# Patient Record
Sex: Male | Born: 1980 | Race: White | Hispanic: No | Marital: Single | Smoking: Never smoker
Health system: Southern US, Community
[De-identification: ages and names within clinical notes are randomized; demographics above are authoritative.]

## PROBLEM LIST (undated history)

## (undated) DIAGNOSIS — J690 Pneumonitis due to inhalation of food and vomit: Secondary | ICD-10-CM

## (undated) DIAGNOSIS — G4730 Sleep apnea, unspecified: Secondary | ICD-10-CM

## (undated) DIAGNOSIS — F419 Anxiety disorder, unspecified: Secondary | ICD-10-CM

## (undated) DIAGNOSIS — J302 Other seasonal allergic rhinitis: Secondary | ICD-10-CM

## (undated) DIAGNOSIS — E119 Type 2 diabetes mellitus without complications: Secondary | ICD-10-CM

## (undated) DIAGNOSIS — I10 Essential (primary) hypertension: Secondary | ICD-10-CM

## (undated) DIAGNOSIS — K449 Diaphragmatic hernia without obstruction or gangrene: Secondary | ICD-10-CM

## (undated) DIAGNOSIS — K5792 Diverticulitis of intestine, part unspecified, without perforation or abscess without bleeding: Secondary | ICD-10-CM

## (undated) DIAGNOSIS — K469 Unspecified abdominal hernia without obstruction or gangrene: Secondary | ICD-10-CM

## (undated) DIAGNOSIS — R7303 Prediabetes: Secondary | ICD-10-CM

## (undated) DIAGNOSIS — K219 Gastro-esophageal reflux disease without esophagitis: Secondary | ICD-10-CM

## (undated) HISTORY — DX: Diverticulitis of intestine, part unspecified, without perforation or abscess without bleeding: K57.92

## (undated) HISTORY — DX: Pneumonitis due to inhalation of food and vomit: J69.0

## (undated) HISTORY — DX: Gastro-esophageal reflux disease without esophagitis: K21.9

---

## 2006-03-29 ENCOUNTER — Emergency Department (HOSPITAL_COMMUNITY): Admission: EM | Admit: 2006-03-29 | Discharge: 2006-03-30 | Payer: Self-pay | Admitting: Emergency Medicine

## 2007-05-01 ENCOUNTER — Emergency Department (HOSPITAL_COMMUNITY): Admission: EM | Admit: 2007-05-01 | Discharge: 2007-05-01 | Payer: Self-pay | Admitting: Emergency Medicine

## 2010-04-18 ENCOUNTER — Emergency Department (HOSPITAL_COMMUNITY): Admission: EM | Admit: 2010-04-18 | Discharge: 2010-04-19 | Payer: Self-pay | Admitting: Emergency Medicine

## 2010-04-25 ENCOUNTER — Ambulatory Visit (HOSPITAL_COMMUNITY): Admission: RE | Admit: 2010-04-25 | Discharge: 2010-04-25 | Payer: Self-pay | Admitting: Family Medicine

## 2010-04-30 ENCOUNTER — Ambulatory Visit (HOSPITAL_COMMUNITY): Admission: RE | Admit: 2010-04-30 | Discharge: 2010-04-30 | Payer: Self-pay | Admitting: Family Medicine

## 2011-01-28 LAB — URINE MICROSCOPIC-ADD ON

## 2011-01-28 LAB — COMPREHENSIVE METABOLIC PANEL
BUN: 9 mg/dL (ref 6–23)
Calcium: 9.9 mg/dL (ref 8.4–10.5)
GFR calc Af Amer: >60 mL/min (ref >60)
GFR calc non Af Amer: 58 mL/min — ABNORMAL LOW (ref >60)
Potassium: 3.6 mEq/L (ref 3.5–5.1)
Sodium: 139 mEq/L (ref 135–145)
Total Protein: 7.3 g/dL (ref 6.0–8.3)

## 2011-01-28 LAB — URINALYSIS, ROUTINE W REFLEX MICROSCOPIC
Bilirubin Urine: NEGATIVE
Glucose, UA: NEGATIVE mg/dL
Ketones, ur: NEGATIVE mg/dL
Leukocytes, UA: NEGATIVE
Nitrite: NEGATIVE
Specific Gravity, Urine: >1.03 — ABNORMAL HIGH (ref 1.005–1.030)

## 2011-01-28 LAB — DIFFERENTIAL
Basophils Absolute: 0.1 10*3/uL (ref 0.0–0.1)
Eosinophils Relative: 2 % (ref 0–5)
Lymphocytes Relative: 35 % (ref 12–46)
Monocytes Absolute: 1.4 10*3/uL — ABNORMAL HIGH (ref 0.1–1.0)

## 2011-01-28 LAB — CK TOTAL AND CKMB (NOT AT ARMC): CK, MB: 1.1 ng/mL (ref 0.3–4.0)

## 2013-12-02 ENCOUNTER — Ambulatory Visit (HOSPITAL_COMMUNITY)
Admission: RE | Admit: 2013-12-02 | Discharge: 2013-12-02 | Disposition: A | Discharge status: Home / Self Care | Payer: BC Managed Care – PPO | Source: Ambulatory Visit | Attending: Family Medicine | Admitting: Family Medicine

## 2013-12-02 ENCOUNTER — Other Ambulatory Visit (HOSPITAL_COMMUNITY): Payer: Self-pay | Admitting: Family Medicine

## 2013-12-02 DIAGNOSIS — R52 Pain, unspecified: Secondary | ICD-10-CM

## 2013-12-02 DIAGNOSIS — Z9181 History of falling: Secondary | ICD-10-CM

## 2013-12-02 DIAGNOSIS — M545 Low back pain, unspecified: Secondary | ICD-10-CM | POA: Insufficient documentation

## 2013-12-02 DIAGNOSIS — M25559 Pain in unspecified hip: Secondary | ICD-10-CM | POA: Insufficient documentation

## 2016-03-04 DIAGNOSIS — H1013 Acute atopic conjunctivitis, bilateral: Secondary | ICD-10-CM | POA: Diagnosis not present

## 2016-03-04 DIAGNOSIS — H40033 Anatomical narrow angle, bilateral: Secondary | ICD-10-CM | POA: Diagnosis not present

## 2016-06-04 DIAGNOSIS — R0981 Nasal congestion: Secondary | ICD-10-CM | POA: Diagnosis not present

## 2016-06-04 DIAGNOSIS — B999 Unspecified infectious disease: Secondary | ICD-10-CM | POA: Diagnosis not present

## 2016-07-11 DIAGNOSIS — R0981 Nasal congestion: Secondary | ICD-10-CM | POA: Diagnosis not present

## 2017-02-08 ENCOUNTER — Emergency Department (HOSPITAL_COMMUNITY)
Admission: EM | Admit: 2017-02-08 | Discharge: 2017-02-09 | Disposition: A | Discharge status: Home / Self Care | Payer: BLUE CROSS/BLUE SHIELD | Attending: Emergency Medicine | Admitting: Emergency Medicine

## 2017-02-08 ENCOUNTER — Encounter (HOSPITAL_COMMUNITY): Payer: Self-pay | Admitting: Emergency Medicine

## 2017-02-08 DIAGNOSIS — S40262D Insect bite (nonvenomous) of left shoulder, subsequent encounter: Secondary | ICD-10-CM | POA: Diagnosis not present

## 2017-02-08 DIAGNOSIS — S40252A Superficial foreign body of left shoulder, initial encounter: Secondary | ICD-10-CM | POA: Diagnosis not present

## 2017-02-08 DIAGNOSIS — W57XXXD Bitten or stung by nonvenomous insect and other nonvenomous arthropods, subsequent encounter: Secondary | ICD-10-CM | POA: Diagnosis not present

## 2017-02-08 DIAGNOSIS — Z23 Encounter for immunization: Secondary | ICD-10-CM | POA: Diagnosis not present

## 2017-02-08 DIAGNOSIS — W57XXXA Bitten or stung by nonvenomous insect and other nonvenomous arthropods, initial encounter: Secondary | ICD-10-CM

## 2017-02-08 MED ORDER — TETANUS-DIPHTHERIA TOXOIDS TD 5-2 LFU IM INJ: 0.5000 mL | INJECTION | Freq: Once | INTRAMUSCULAR | Status: DC

## 2017-02-08 MED ORDER — TETANUS-DIPHTH-ACELL PERTUSSIS 5-2.5-18.5 LF-MCG/0.5 IM SUSP
0.5000 mL | Freq: Once | INTRAMUSCULAR | Status: AC
  Administered 2017-02-08: 0.5 mL via INTRAMUSCULAR
  Filled 2017-02-08: qty 0.5

## 2017-02-08 MED ORDER — TETANUS-DIPHTH-ACELL PERTUSSIS 5-2.5-18.5 LF-MCG/0.5 IM SUSP
INTRAMUSCULAR | Status: AC
  Administered 2017-02-08: 0.5 mL via INTRAMUSCULAR
  Filled 2017-02-08: qty 0.5

## 2017-02-08 MED ORDER — DOXYCYCLINE HYCLATE 100 MG PO TABS
100.0000 mg | ORAL_TABLET | Freq: Once | ORAL | Status: AC
  Administered 2017-02-08: 100 mg via ORAL
  Filled 2017-02-08: qty 1

## 2017-02-08 MED ORDER — DOXYCYCLINE HYCLATE 100 MG PO CAPS: 100.0000 mg | ORAL_CAPSULE | Freq: Two times a day (BID) | ORAL | 0 refills | Status: DC

## 2017-02-08 NOTE — ED Triage Notes (Signed)
Works as a Development worker, [employer]- tonight found tick attached to his left scapular area

## 2017-02-08 NOTE — ED Provider Notes (Signed)
[facility] DEPT Provider Note   CSN: [id number] Arrival date & time: 02/08/17  2144  By signing my name below, I, Julien Nordmann, attest that this documentation has been prepared under the direction and in the presence of [place], [place].  Electronically Signed: Julien Nordmann, ED Scribe. 02/08/17. 10:37 PM.    History   Chief Complaint Chief Complaint  Patient presents with  . Tick Removal   The history is provided by the patient. No language interpreter was used.   HPI Comments: Elijah Shaffer is a 36 y.o. male who presents to the Emergency Department presenting for a tick removal on his left scapula that was noticed this evening. 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 His mother attempted to pull the tick out, but states the head remained inside. He does not have any complaints or symptoms at this time. Pt is not UTD on his tetanus shot.  History reviewed. No pertinent past medical history.  There are no active problems to display for this patient.   History reviewed. No pertinent surgical history.     Home Medications    Prior to Admission medications   Not on File    Family History History reviewed. No pertinent family history.  Social History Social History  Substance Use Topics  . Smoking status: Never Smoker  . Smokeless tobacco: Never Used  . Alcohol use Yes     Allergies   Claritin [loratadine]   Review of Systems Review of Systems  Skin: Positive for wound.  All other systems reviewed and are negative.    Physical Exam Updated Vital Signs BP (!) 141/92 (BP Location: Right Arm)   Pulse (!) 50   Temp 98.2 F (36.8 C) (Oral)   Resp 16   Ht 6\' 3"  (1.905 m)   Wt 230 lb (104.3 kg)   SpO2 100%   BMI 28.75 kg/m   Physical Exam  Constitutional: He is oriented to person, place, and time. He appears well-developed and well-nourished.  HENT:  Head: Normocephalic.  Eyes: EOM are normal.  Neck: Normal  range of motion.  Pulmonary/Chest: Effort normal.  Abdominal: He exhibits no distension.  Musculoskeletal: Normal range of motion.  One pinpoint area of darkness with 2 mm area of erythema to the left scapula region  Neurological: He is alert and oriented to person, place, and time.  Psychiatric: He has a normal mood and affect.  Nursing note and vitals reviewed.    ED Treatments / Results  DIAGNOSTIC STUDIES: Oxygen Saturation is 100% on RA, normal by my interpretation.  COORDINATION OF CARE: 10:37 PM Discussed treatment plan which includes removal of the tick with pt at bedside and pt agreed to plan.  Labs (all labs ordered are listed, but only abnormal results are displayed) Labs Reviewed - No data to display  EKG  EKG Interpretation None     Procedure.  Alcohol prep,  I removed a small dark spot from center of tick bite.    Radiology No results found.  Procedures Procedures (including critical care time)  Medications Ordered in ED Medications - No data to display   Initial Impression / Assessment and Plan / ED Course  I have reviewed the triage vital signs and the nursing notes.  Pertinent labs & imaging results that were available during my care of the patient were reviewed by me and considered in my medical decision making (see chart for details).     Meds ordered  this encounter  Medications  . DISCONTD: tetanus & diphtheria toxoids (adult) (TENIVAC) injection 0.5 mL  . Tdap (BOOSTRIX) injection 0.5 mL  . doxycycline (VIBRAMYCIN) 100 MG capsule    Sig: Take 1 capsule (100 mg total) by mouth 2 (two) times daily.    Dispense:  20 capsule    Refill:  0    Order Specific Question:   Supervising Provider    Answer:   MILLER, BRIAN [[id number]]  . Tdap (BOOSTRIX) 5-2.5-18.5 LF-MCG/0.5 injection    Jimmye Norman, [place]: cabinet override  . doxycycline (VIBRA-TABS) tablet 100 mg    Final Clinical Impressions(s) / ED Diagnoses   Final diagnoses:  Tick bite with  subsequent removal of tick     New Prescriptions Discharge Medication List as of 02/08/2017 11:12 PM    START taking these medications   Details  doxycycline (VIBRAMYCIN) 100 MG capsule Take 1 capsule (100 mg total) by mouth 2 (two) times daily., Starting Sat 02/08/2017, Print      An After Visit Summary was printed and given to the patient.  I personally performed the services in this documentation, which was scribed in my presence.  The recorded information has been reviewed and considered.   Ronnald Collum.   Fransico Meadow, PA-C 02/09/17 [place], DO 02/11/17 2005

## 2017-04-15 DIAGNOSIS — W57XXXA Bitten or stung by nonvenomous insect and other nonvenomous arthropods, initial encounter: Secondary | ICD-10-CM | POA: Diagnosis not present

## 2017-04-15 DIAGNOSIS — L723 Sebaceous cyst: Secondary | ICD-10-CM | POA: Diagnosis not present

## 2017-04-15 DIAGNOSIS — J302 Other seasonal allergic rhinitis: Secondary | ICD-10-CM | POA: Diagnosis not present

## 2017-05-15 DIAGNOSIS — L570 Actinic keratosis: Secondary | ICD-10-CM | POA: Diagnosis not present

## 2017-05-15 DIAGNOSIS — L723 Sebaceous cyst: Secondary | ICD-10-CM | POA: Diagnosis not present

## 2017-06-16 ENCOUNTER — Emergency Department (HOSPITAL_COMMUNITY): Payer: BLUE CROSS/BLUE SHIELD

## 2017-06-16 ENCOUNTER — Encounter (HOSPITAL_COMMUNITY): Payer: Self-pay | Admitting: Emergency Medicine

## 2017-06-16 ENCOUNTER — Emergency Department (HOSPITAL_COMMUNITY)
Admission: EM | Admit: 2017-06-16 | Discharge: 2017-06-16 | Disposition: A | Discharge status: Home / Self Care | Payer: BLUE CROSS/BLUE SHIELD | Attending: Emergency Medicine | Admitting: Emergency Medicine

## 2017-06-16 DIAGNOSIS — R221 Localized swelling, mass and lump, neck: Secondary | ICD-10-CM | POA: Diagnosis not present

## 2017-06-16 DIAGNOSIS — J029 Acute pharyngitis, unspecified: Secondary | ICD-10-CM | POA: Diagnosis not present

## 2017-06-16 DIAGNOSIS — K209 Esophagitis, unspecified without bleeding: Secondary | ICD-10-CM

## 2017-06-16 MED ORDER — SUCRALFATE 1 GM/10ML PO SUSP: 1.0000 g | Freq: Three times a day (TID) | ORAL | 0 refills | Status: DC

## 2017-06-16 MED ORDER — GLUCAGON HCL RDNA (DIAGNOSTIC) 1 MG IJ SOLR
1.0000 mg | Freq: Once | INTRAMUSCULAR | Status: AC | PRN
  Administered 2017-06-16: 1 mg via INTRAVENOUS
  Filled 2017-06-16: qty 1

## 2017-06-16 NOTE — ED Provider Notes (Signed)
[facility] DEPT Provider Note   CSN: [id number] Arrival date & time: 06/16/17  1710     History   Chief Complaint Chief Complaint  Patient presents with  . Sore Throat    HPI Elijah Shaffer is a 36 y.o. male.  The history is provided by the patient. No language interpreter was used.  Sore Throat  This is a new problem. The problem occurs constantly. The problem has been gradually worsening. Pertinent negatives include no abdominal pain. Nothing aggravates the symptoms. Nothing relieves the symptoms. He has tried nothing for the symptoms. The treatment provided no relief.   Pt reports he ate a steak biscuit for breakfast.  Pt reports he began feeling like something was stuck in his throat afterward.  Pt tried to make himself vomit and tried drinking to wash it down.  Pt reports he is able to drink but then feels like he has to vomit.   Pt has not been able to eat.  Pt feels like something is stuck. (Pt Mother reports he does not chew his food well and eat to fast) History reviewed. No pertinent past medical history.  There are no active problems to display for this patient.   History reviewed. No pertinent surgical history.     Home Medications    Prior to Admission medications   Medication Sig Start Date End Date Taking? Authorizing Provider  diphenhydrAMINE (BENADRYL) 25 MG tablet Take 25 mg by mouth once.   Yes [provider]  ibuprofen (ADVIL,MOTRIN) 200 MG tablet Take 200 mg by mouth every 6 (six) hours as needed for moderate pain.   Yes [provider]    Family History No family history on file.  Social History Social History  Substance Use Topics  . Smoking status: Never Smoker  . Smokeless tobacco: Never Used  . Alcohol use Yes     Allergies   Claritin [loratadine]   Review of Systems Review of Systems  Gastrointestinal: Negative for abdominal pain.  All other systems reviewed and are negative.    Physical Exam Updated  Vital Signs BP (!) 129/95 (BP Location: Right Arm)   Pulse 99   Temp 98.4 F (36.9 C) (Oral)   Resp 18   Ht 6\' 3"  (1.905 m)   Wt 104.3 kg (230 lb)   SpO2 99%   BMI 28.75 kg/m   Physical Exam  Constitutional: He appears well-developed.  HENT:  Head: Normocephalic.  Eyes: Pupils are equal, round, and reactive to light. Conjunctivae and EOM are normal.  Neck: Normal range of motion.  Cardiovascular: Normal rate.   Pulmonary/Chest: Effort normal.  Musculoskeletal: Normal range of motion.  Neurological: He is alert.  Skin: Skin is warm.  Psychiatric: He has a normal mood and affect.  Nursing note and vitals reviewed.    ED Treatments / Results  Labs (all labs ordered are listed, but only abnormal results are displayed) Labs Reviewed - No data to display  EKG  EKG Interpretation None       Radiology No results found.  Procedures Procedures (including critical care time)  Medications Ordered in ED Medications  glucagon (human recombinant) (GLUCAGEN) injection 1 mg (not administered)     Initial Impression / Assessment and Plan / ED Course  I have reviewed the triage vital signs and the nursing notes.  Pertinent labs & imaging results that were available during my care of the patient were reviewed by me and considered in my medical decision making (see chart for  details).     Pt reports no change with glucagon.  Pt able to tolerate 8 oz coke without vomiting.  I spoke to Dr. Gala Romney [facility]  who advised carafate.  Pt is to call him in the am to schedule followup.  Pt advised clear liquids.  I doubt meat impaction as pt is able to tolerate oral fluids, possible esophagitis/esphageal abrasion.  Pt counseled on results and plan.  Pt agrees to plan.  Final Clinical Impressions(s) / ED Diagnoses   Final diagnoses:  Esophagitis    New Prescriptions New Prescriptions   No medications on file  An After Visit Summary was printed and given to the patient. Meds ordered  this encounter  Medications  . glucagon (human recombinant) (GLUCAGEN) injection 1 mg  . diphenhydrAMINE (BENADRYL) 25 MG tablet    Sig: Take 25 mg by mouth once.  [name] ibuprofen (ADVIL,MOTRIN) 200 MG tablet    Sig: Take 200 mg by mouth every 6 (six) hours as needed for moderate pain.  [name] sucralfate (CARAFATE) 1 GM/10ML suspension    Sig: Take 10 mLs (1 g total) by mouth 4 (four) times daily -  with meals and at bedtime.    Dispense:  400 mL    Refill:  0    Order Specific Question:   Supervising Provider    Answer:   Noemi Chapel [[id number]]     Fransico Meadow, PA-C 06/16/17 2213    Orlie Dakin, MD 06/17/17 [phone]

## 2017-06-16 NOTE — ED Notes (Signed)
Pt to xray

## 2017-06-16 NOTE — ED Triage Notes (Signed)
Patient states that he feels like there is something in his throat. States the sensation is on the left of his throat. Patient is able to breath and eat and swallow fluid at home. NAD.

## 2017-06-19 DIAGNOSIS — D485 Neoplasm of uncertain behavior of skin: Secondary | ICD-10-CM | POA: Diagnosis not present

## 2017-06-28 ENCOUNTER — Inpatient Hospital Stay (HOSPITAL_COMMUNITY)
Admission: EM | Admit: 2017-06-28 | Discharge: 2017-07-02 | DRG: 392 | Disposition: A | Discharge status: Home / Self Care | Payer: BLUE CROSS/BLUE SHIELD | Attending: Internal Medicine | Admitting: Internal Medicine

## 2017-06-28 ENCOUNTER — Emergency Department (HOSPITAL_COMMUNITY): Payer: BLUE CROSS/BLUE SHIELD

## 2017-06-28 ENCOUNTER — Encounter (HOSPITAL_COMMUNITY): Payer: Self-pay | Admitting: Cardiology

## 2017-06-28 DIAGNOSIS — K5792 Diverticulitis of intestine, part unspecified, without perforation or abscess without bleeding: Secondary | ICD-10-CM

## 2017-06-28 DIAGNOSIS — Z79899 Other long term (current) drug therapy: Secondary | ICD-10-CM | POA: Diagnosis not present

## 2017-06-28 DIAGNOSIS — N179 Acute kidney failure, unspecified: Secondary | ICD-10-CM | POA: Diagnosis not present

## 2017-06-28 DIAGNOSIS — Z888 Allergy status to other drugs, medicaments and biological substances status: Secondary | ICD-10-CM

## 2017-06-28 DIAGNOSIS — D72829 Elevated white blood cell count, unspecified: Secondary | ICD-10-CM | POA: Diagnosis not present

## 2017-06-28 DIAGNOSIS — E869 Volume depletion, unspecified: Secondary | ICD-10-CM | POA: Diagnosis not present

## 2017-06-28 DIAGNOSIS — K5720 Diverticulitis of large intestine with perforation and abscess without bleeding: Principal | ICD-10-CM | POA: Diagnosis present

## 2017-06-28 DIAGNOSIS — K5732 Diverticulitis of large intestine without perforation or abscess without bleeding: Secondary | ICD-10-CM

## 2017-06-28 DIAGNOSIS — R1110 Vomiting, unspecified: Secondary | ICD-10-CM | POA: Diagnosis not present

## 2017-06-28 DIAGNOSIS — R109 Unspecified abdominal pain: Secondary | ICD-10-CM | POA: Diagnosis not present

## 2017-06-28 HISTORY — DX: Acute kidney failure, unspecified: N17.9

## 2017-06-28 LAB — COMPREHENSIVE METABOLIC PANEL
ALT: 31 U/L (ref 17–63)
ANION GAP: 9 (ref 5–15)
AST: 25 U/L (ref 15–41)
Albumin: 4.5 g/dL (ref 3.5–5.0)
Alkaline Phosphatase: 66 U/L (ref 38–126)
BUN: 13 mg/dL (ref 6–20)
CHLORIDE: 101 mmol/L (ref 101–111)
CO2: 26 mmol/L (ref 22–32)
Calcium: 9.6 mg/dL (ref 8.9–10.3)
Creatinine, Ser: 1.43 mg/dL — ABNORMAL HIGH (ref 0.61–1.24)
GFR calc non Af Amer: >60 mL/min (ref >60)
Glucose, Bld: 113 mg/dL — ABNORMAL HIGH (ref 65–99)
Potassium: 3.8 mmol/L (ref 3.5–5.1)
SODIUM: 136 mmol/L (ref 135–145)
Total Bilirubin: 2.6 mg/dL — ABNORMAL HIGH (ref 0.3–1.2)
Total Protein: 8.2 g/dL — ABNORMAL HIGH (ref 6.5–8.1)

## 2017-06-28 LAB — CBC WITH DIFFERENTIAL/PLATELET
BASOS PCT: 0 %
Basophils Absolute: 0 10*3/uL (ref 0.0–0.1)
EOS ABS: 0 10*3/uL (ref 0.0–0.7)
EOS PCT: 0 %
HCT: 46.6 % (ref 39.0–52.0)
Hemoglobin: 15.7 g/dL (ref 13.0–17.0)
LYMPHS ABS: 1.5 10*3/uL (ref 0.7–4.0)
Lymphocytes Relative: 7 %
MCH: 30.2 pg (ref 26.0–34.0)
MCHC: 33.7 g/dL (ref 30.0–36.0)
MCV: 89.6 fL (ref 78.0–100.0)
MONOS PCT: 10 %
Monocytes Absolute: 2.2 10*3/uL — ABNORMAL HIGH (ref 0.1–1.0)
Neutro Abs: 18 10*3/uL — ABNORMAL HIGH (ref 1.7–7.7)
Neutrophils Relative %: 83 %
PLATELETS: 234 10*3/uL (ref 150–400)
RBC: 5.2 MIL/uL (ref 4.22–5.81)
RDW: 13.2 % (ref 11.5–15.5)
WBC: 21.7 10*3/uL — AB (ref 4.0–10.5)

## 2017-06-28 LAB — LIPASE, BLOOD: Lipase: 34 U/L (ref 11–51)

## 2017-06-28 MED ORDER — METRONIDAZOLE IN NACL 5-0.79 MG/ML-% IV SOLN
500.0000 mg | Freq: Three times a day (TID) | INTRAVENOUS | Status: DC
  Administered 2017-06-28 – 2017-07-02 (×12): 500 mg via INTRAVENOUS
  Filled 2017-06-28 (×12): qty 100

## 2017-06-28 MED ORDER — KETOROLAC TROMETHAMINE 30 MG/ML IJ SOLN
30.0000 mg | Freq: Four times a day (QID) | INTRAMUSCULAR | Status: DC | PRN
  Administered 2017-06-28 – 2017-06-29 (×4): 30 mg via INTRAVENOUS
  Filled 2017-06-28 (×4): qty 1

## 2017-06-28 MED ORDER — SODIUM CHLORIDE 0.9 % IV BOLUS (SEPSIS)
1000.0000 mL | Freq: Once | INTRAVENOUS | Status: AC
  Administered 2017-06-28: 1000 mL via INTRAVENOUS

## 2017-06-28 MED ORDER — METRONIDAZOLE IN NACL 5-0.79 MG/ML-% IV SOLN
500.0000 mg | Freq: Once | INTRAVENOUS | Status: AC
  Administered 2017-06-28: 500 mg via INTRAVENOUS
  Filled 2017-06-28: qty 100

## 2017-06-28 MED ORDER — ACETAMINOPHEN 650 MG RE SUPP: 650.0000 mg | Freq: Four times a day (QID) | RECTAL | Status: DC | PRN

## 2017-06-28 MED ORDER — ACETAMINOPHEN 325 MG PO TABS: 650.0000 mg | ORAL_TABLET | Freq: Four times a day (QID) | ORAL | Status: DC | PRN

## 2017-06-28 MED ORDER — CIPROFLOXACIN IN D5W 400 MG/200ML IV SOLN
400.0000 mg | Freq: Once | INTRAVENOUS | Status: AC
  Administered 2017-06-28: 400 mg via INTRAVENOUS
  Filled 2017-06-28: qty 200

## 2017-06-28 MED ORDER — ONDANSETRON HCL 4 MG/2ML IJ SOLN
4.0000 mg | Freq: Four times a day (QID) | INTRAMUSCULAR | Status: DC | PRN
  Administered 2017-06-29: 4 mg via INTRAVENOUS
  Filled 2017-06-28 (×2): qty 2

## 2017-06-28 MED ORDER — ONDANSETRON HCL 4 MG PO TABS: 4.0000 mg | ORAL_TABLET | Freq: Four times a day (QID) | ORAL | Status: DC | PRN

## 2017-06-28 MED ORDER — CIPROFLOXACIN IN D5W 400 MG/200ML IV SOLN
400.0000 mg | Freq: Two times a day (BID) | INTRAVENOUS | Status: DC
  Administered 2017-06-28 – 2017-07-02 (×8): 400 mg via INTRAVENOUS
  Filled 2017-06-28 (×9): qty 200

## 2017-06-28 MED ORDER — MORPHINE SULFATE (PF) 4 MG/ML IV SOLN
4.0000 mg | Freq: Once | INTRAVENOUS | Status: AC
  Administered 2017-06-28: 4 mg via INTRAVENOUS
  Filled 2017-06-28: qty 1

## 2017-06-28 MED ORDER — SUCRALFATE 1 G PO TABS
1.0000 g | ORAL_TABLET | Freq: Four times a day (QID) | ORAL | Status: DC
  Administered 2017-06-28 – 2017-07-02 (×15): 1 g via ORAL
  Filled 2017-06-28 (×16): qty 1

## 2017-06-28 MED ORDER — IOPAMIDOL (ISOVUE-300) INJECTION 61%
100.0000 mL | Freq: Once | INTRAVENOUS | Status: AC | PRN
  Administered 2017-06-28: 100 mL via INTRAVENOUS

## 2017-06-28 MED ORDER — SODIUM CHLORIDE 0.9 % IV SOLN
INTRAVENOUS | Status: DC
  Administered 2017-06-28 – 2017-07-02 (×9): via INTRAVENOUS

## 2017-06-28 MED ORDER — ONDANSETRON HCL 4 MG/2ML IJ SOLN
4.0000 mg | Freq: Once | INTRAMUSCULAR | Status: AC
  Administered 2017-06-28: 4 mg via INTRAVENOUS
  Filled 2017-06-28: qty 2

## 2017-06-28 NOTE — ED Notes (Signed)
Patient transported to CT 

## 2017-06-28 NOTE — ED Provider Notes (Signed)
[facility] DEPT Provider Note   CSN: [id number] Arrival date & time: 06/28/17  1033     History   Chief Complaint Chief Complaint  Patient presents with  . Abdominal Pain    HPI Elijah Shaffer is a 36 y.o. male.  Generalized abdominal pain for 3 days with associated vomiting last night. Questionable low-grade fever. He is normally healthy. No vomiting, diarrhea, chest pain, dyspnea. Severity symptoms is moderate. Nothing makes symptoms better or worse.      History reviewed. No pertinent past medical history.  There are no active problems to display for this patient.   History reviewed. No pertinent surgical history.     Home Medications    Prior to Admission medications   Medication Sig Start Date End Date Taking? Authorizing Provider  diphenhydrAMINE (BENADRYL) 25 MG tablet Take 25 mg by mouth once.   Yes [provider]  ibuprofen (ADVIL,MOTRIN) 200 MG tablet Take 200 mg by mouth every 6 (six) hours as needed for moderate pain.   Yes [provider]  sucralfate (CARAFATE) 1 g tablet Take 1 tablet by mouth 4 (four) times daily. 06/17/17  Yes [provider]    Family History History reviewed. No pertinent family history.  Social History Social History  Substance Use Topics  . Smoking status: Never Smoker  . Smokeless tobacco: Never Used  . Alcohol use Yes     Allergies   Claritin [loratadine]   Review of Systems Review of Systems  All other systems reviewed and are negative.    Physical Exam Updated Vital Signs BP 130/79   Pulse 97   Temp 99.3 F (37.4 C) (Oral)   Ht 6\' 3"  (1.905 m)   Wt 104.3 kg (230 lb)   SpO2 93%   BMI 28.75 kg/m   Physical Exam  Constitutional: He is oriented to person, place, and time. He appears well-developed and well-nourished.  HENT:  Head: Normocephalic and atraumatic.  Eyes: Conjunctivae are normal.  Neck: Neck supple.  Cardiovascular: Normal rate and regular rhythm.     Pulmonary/Chest: Effort normal and breath sounds normal.  Abdominal:  Diffuse lower abdominal tenderness.  Musculoskeletal: Normal range of motion.  Neurological: He is alert and oriented to person, place, and time.  Skin: Skin is warm and dry.  Psychiatric: He has a normal mood and affect. His behavior is normal.  Nursing note and vitals reviewed.    ED Treatments / Results  Labs (all labs ordered are listed, but only abnormal results are displayed) Labs Reviewed  CBC WITH DIFFERENTIAL/PLATELET - Abnormal; Notable for the following:       Result Value   WBC 21.7 (*)    Neutro Abs 18.0 (*)    Monocytes Absolute 2.2 (*)    All other components within normal limits  COMPREHENSIVE METABOLIC PANEL - Abnormal; Notable for the following:    Glucose, Bld 113 (*)    Creatinine, Ser 1.43 (*)    Total Protein 8.2 (*)    Total Bilirubin 2.6 (*)    All other components within normal limits  LIPASE, BLOOD    EKG  EKG Interpretation None       Radiology Ct Abdomen Pelvis W Contrast  Result Date: 06/28/2017 CLINICAL DATA:  Abdominal pain and vomiting EXAM: CT ABDOMEN AND PELVIS WITH CONTRAST TECHNIQUE: Multidetector CT imaging of the abdomen and pelvis was performed using the standard protocol following bolus administration of intravenous contrast. CONTRAST:  160mL ISOVUE-300 IOPAMIDOL (ISOVUE-300) INJECTION 61% COMPARISON:  None.  FINDINGS: Lower chest: There is mild bibasilar atelectasis, more on the left than on the right. There is a moderate hiatal hernia. Hepatobiliary: No focal liver lesions are evident. Gallbladder wall does not appear appreciably thickened. There is no biliary duct dilatation. Pancreas: No pancreatic mass or inflammatory focus. Spleen: No splenic lesions are evident. Adrenals/Urinary Tract: Adrenals appear unremarkable bilaterally. Kidneys bilaterally show no evident mass or hydronephrosis on either side. There is no appreciable renal or ureteral calculus on either  side. Urinary bladder is midline with wall thickness within normal limits. Stomach/Bowel: There is extensive sigmoid and descending colonic diverticulosis. There is wall thickening in the mid sigmoid colon anteriorly and just to the right of midline with extensive stranding in the adjacent mesenteric consistent with acute diverticulitis. There are a few tiny foci of microperforation in this area. No well-defined abscess. Elsewhere, no bowel wall or mesenteric thickening. No evident bowel obstruction. No free air is seen beyond the micro- perforation in the region of the sigmoid diverticulitis. No portal venous air evident. Vascular/Lymphatic: There is no abdominal aortic aneurysm. No vascular lesions are evident. There is no adenopathy in the abdomen or pelvis. Reproductive: Prostate and seminal vesicles are normal in size and contour. No evident pelvic mass. Other: Appendix appears normal. No abscess or ascites noted in the abdomen or pelvis. There is a minimal ventral hernia containing only fat. Musculoskeletal: There is slight anterior wedging of the T11 vertebral body. There are no blastic or lytic bone lesions. There is no intramuscular or abdominal wall lesion. IMPRESSION: 1. Focal sigmoid diverticulitis in mid sigmoid colon with wall thickening and mesenteric thickening in this area. There are foci of microperforation in this area. No abscess. 2. Extensive descending colon and sigmoid diverticulosis. No bowel obstruction. No free air beyond microperforation in the region of diverticulitis in the sigmoid region. 3.  Appendix appears normal. 4.  No renal or ureteral calculus.  No hydronephrosis. 5. Moderate hiatal hernia. Rather minimal ventral hernia containing only fat. Critical Value/emergent results were called by telephone at the time of interpretation on 06/28/2017 at 2:43 pm to Dr. Virgel Manifold, who verbally acknowledged these results. Electronically Signed   By: Lowella Grip III M.D.   On:  06/28/2017 14:44    Procedures Procedures (including critical care time)  Medications Ordered in ED Medications  ciprofloxacin (CIPRO) IVPB 400 mg (not administered)  metroNIDAZOLE (FLAGYL) IVPB 500 mg (not administered)  sodium chloride 0.9 % bolus 1,000 mL (1,000 mLs Intravenous New Bag/Given 06/28/17 1337)  ondansetron (ZOFRAN) injection 4 mg (4 mg Intravenous Given 06/28/17 1336)  morphine 4 MG/ML injection 4 mg (4 mg Intravenous Given 06/28/17 1336)  sodium chloride 0.9 % bolus 1,000 mL (1,000 mLs Intravenous New Bag/Given 06/28/17 1336)  iopamidol (ISOVUE-300) 61 % injection 100 mL (100 mLs Intravenous Contrast Given 06/28/17 1419)     Initial Impression / Assessment and Plan / ED Course  I have reviewed the triage vital signs and the nursing notes.  Pertinent labs & imaging results that were available during my care of the patient were reviewed by me and considered in my medical decision making (see chart for details).     Patient presents with diffuse lower abdominal pain. White count 21K.  CT reveals sigmoid diverticulitis with microperforations. Will hydrate, treat pain, IV Flagyl,  IV Cipro. Admit to general medicine.  Final Clinical Impressions(s) / ED Diagnoses   Final diagnoses:  Sigmoid diverticulitis    New Prescriptions New Prescriptions   No medications on file  Nat Christen, MD 06/28/17 [phone]

## 2017-06-28 NOTE — H&P (Signed)
History and Physical  Elijah Shaffer OFB:[id number] DOB: Dec 05, 1980 DOA: 06/28/2017  Referring physician: Dr. Lacinda Axon, ED physician PCP: Jani Gravel, MD  Outpatient Specialists: None  Patient Coming From: Home  Chief Complaint: Abdominal pain  HPI: Elijah Shaffer is a 36 y.o. male with an uncomplicated medical history. Patient has been experiencing pain over the past week in his abdomen, particularly on the right lower abdomen. He's been having feeling sick to his stomach as well. Denies fevers, chills. Over the past 48-72 hours, his pain has acutely increased the patient came to the hospital for evaluation. No particular palliating or provoking factors, although pain medicine given here has been helpful. No blood in his stools.  Emergency Department Course:  CT scan shows diverticulitis in the sigmoid colon with microperforations. White count 21.7 creatinine elevated to 1.43.  Review of Systems:   Pt denies any fevers, chills, vomiting, diarrhea, constipation, shortness of breath, dyspnea on exertion, orthopnea, cough, wheezing, palpitations, headache, vision changes, lightheadedness, dizziness, melena, rectal bleeding.  Review of systems are otherwise negative  History reviewed. No pertinent past medical history. History reviewed. No pertinent surgical history. Social History:  reports that he has never smoked. He has never used smokeless tobacco. He reports that he drinks alcohol. He reports that he does not use drugs. Patient lives at home  Allergies  Allergen Reactions  . Claritin [Loratadine] Other (See Comments)    Heart race    History reviewed. No pertinent family history.  No family history of diverticulitis   Prior to Admission medications   Medication Sig Start Date End Date Taking? Authorizing Provider  diphenhydrAMINE (BENADRYL) 25 MG tablet Take 25 mg by mouth once.   Yes [provider]  ibuprofen (ADVIL,MOTRIN) 200 MG tablet Take 200 mg by mouth every 6  (six) hours as needed for moderate pain.   Yes [provider]  sucralfate (CARAFATE) 1 g tablet Take 1 tablet by mouth 4 (four) times daily. 06/17/17  Yes [provider]    Physical Exam: BP (!) 141/81   Pulse (!) 102   Temp 99.3 F (37.4 C) (Oral)   Ht 6\' 3"  (1.905 m)   Wt 104.3 kg (230 lb)   SpO2 93%   BMI 28.75 kg/m   General: Young Caucasian male . Awake and alert and oriented x3. No acute cardiopulmonary distress.  HEENT: Normocephalic atraumatic.  Right and left ears normal in appearance.  Pupils equal, round, reactive to light. Extraocular muscles are intact. Sclerae anicteric and noninjected.  Moist mucosal membranes. No mucosal lesions.  Neck: Neck supple without lymphadenopathy. No carotid bruits. No masses palpated.  Cardiovascular: Regular rate with normal S1-S2 sounds. No murmurs, rubs, gallops auscultated. No JVD.  Respiratory: Good respiratory effort with no wheezes, rales, rhonchi. Lungs clear to auscultation bilaterally.  No accessory muscle use. Abdomen:Soft, tender in the right lower quadrant with mild rebound tenderness. No guarding. Nondistended. Active bowel sounds. No masses or hepatosplenomegaly  Skin: No rashes, lesions, or ulcerations.  Dry, warm to touch. 2+ dorsalis pedis and radial pulses. Musculoskeletal: No calf or leg pain. All major joints not erythematous nontender.  No upper or lower joint deformation.  Good ROM.  No contractures  Psychiatric: Intact judgment and insight. Pleasant and cooperative. Neurologic: No focal neurological deficits. Strength is 5/5 and symmetric in upper and lower extremities.  Cranial nerves II through XII are grossly intact.           Labs on Admission: I have personally reviewed  following labs and imaging studies  CBC:  Recent Labs Lab 06/28/17 1317  WBC 21.7*  NEUTROABS 18.0*  HGB 15.7  HCT 46.6  MCV 89.6  PLT 742   Basic Metabolic Panel:  Recent Labs Lab 06/28/17 1317  NA 136  K 3.8    CL 101  CO2 26  GLUCOSE 113*  BUN 13  CREATININE 1.43*  CALCIUM 9.6   GFR: Estimated Creatinine Clearance: 93.3 mL/min (A) (by C-G formula based on SCr of 1.43 mg/dL (H)). Liver Function Tests:  Recent Labs Lab 06/28/17 1317  AST 25  ALT 31  ALKPHOS 66  BILITOT 2.6*  PROT 8.2*  ALBUMIN 4.5    Recent Labs Lab 06/28/17 1317  LIPASE 34   No results for input(s): AMMONIA in the last 168 hours. Coagulation Profile: No results for input(s): INR, PROTIME in the last 168 hours. Cardiac Enzymes: No results for input(s): CKTOTAL, CKMB, CKMBINDEX, TROPONINI in the last 168 hours. BNP (last 3 results) No results for input(s): PROBNP in the last 8760 hours. HbA1C: No results for input(s): HGBA1C in the last 72 hours. CBG: No results for input(s): GLUCAP in the last 168 hours. Lipid Profile: No results for input(s): CHOL, HDL, LDLCALC, TRIG, CHOLHDL, LDLDIRECT in the last 72 hours. Thyroid Function Tests: No results for input(s): TSH, T4TOTAL, FREET4, T3FREE, THYROIDAB in the last 72 hours. Anemia Panel: No results for input(s): VITAMINB12, FOLATE, FERRITIN, TIBC, IRON, RETICCTPCT in the last 72 hours. Urine analysis:    Component Value Date/Time   COLORURINE YELLOW 04/18/2010 2028   APPEARANCEUR CLEAR 04/18/2010 2028   LABSPEC >1.030 (H) 04/18/2010 2028   PHURINE 6.0 04/18/2010 2028   GLUCOSEU NEGATIVE 04/18/2010 2028   HGBUR TRACE (A) 04/18/2010 2028   BILIRUBINUR NEGATIVE 04/18/2010 2028   KETONESUR NEGATIVE 04/18/2010 2028   PROTEINUR NEGATIVE 04/18/2010 2028   UROBILINOGEN 0.2 04/18/2010 2028   NITRITE NEGATIVE 04/18/2010 2028   LEUKOCYTESUR NEGATIVE 04/18/2010 2028   Sepsis Labs: @LABRCNTIP (procalcitonin:4,lacticidven:4) )No results found for this or any previous visit (from the past 240 hour(s)).   Radiological Exams on Admission: Ct Abdomen Pelvis W Contrast  Result Date: 06/28/2017 CLINICAL DATA:  Abdominal pain and vomiting EXAM: CT ABDOMEN AND PELVIS  WITH CONTRAST TECHNIQUE: Multidetector CT imaging of the abdomen and pelvis was performed using the standard protocol following bolus administration of intravenous contrast. CONTRAST:  18mL ISOVUE-300 IOPAMIDOL (ISOVUE-300) INJECTION 61% COMPARISON:  None. FINDINGS: Lower chest: There is mild bibasilar atelectasis, more on the left than on the right. There is a moderate hiatal hernia. Hepatobiliary: No focal liver lesions are evident. Gallbladder wall does not appear appreciably thickened. There is no biliary duct dilatation. Pancreas: No pancreatic mass or inflammatory focus. Spleen: No splenic lesions are evident. Adrenals/Urinary Tract: Adrenals appear unremarkable bilaterally. Kidneys bilaterally show no evident mass or hydronephrosis on either side. There is no appreciable renal or ureteral calculus on either side. Urinary bladder is midline with wall thickness within normal limits. Stomach/Bowel: There is extensive sigmoid and descending colonic diverticulosis. There is wall thickening in the mid sigmoid colon anteriorly and just to the right of midline with extensive stranding in the adjacent mesenteric consistent with acute diverticulitis. There are a few tiny foci of microperforation in this area. No well-defined abscess. Elsewhere, no bowel wall or mesenteric thickening. No evident bowel obstruction. No free air is seen beyond the micro- perforation in the region of the sigmoid diverticulitis. No portal venous air evident. Vascular/Lymphatic: There is no abdominal aortic aneurysm. No vascular  lesions are evident. There is no adenopathy in the abdomen or pelvis. Reproductive: Prostate and seminal vesicles are normal in size and contour. No evident pelvic mass. Other: Appendix appears normal. No abscess or ascites noted in the abdomen or pelvis. There is a minimal ventral hernia containing only fat. Musculoskeletal: There is slight anterior wedging of the T11 vertebral body. There are no blastic or lytic  bone lesions. There is no intramuscular or abdominal wall lesion. IMPRESSION: 1. Focal sigmoid diverticulitis in mid sigmoid colon with wall thickening and mesenteric thickening in this area. There are foci of microperforation in this area. No abscess. 2. Extensive descending colon and sigmoid diverticulosis. No bowel obstruction. No free air beyond microperforation in the region of diverticulitis in the sigmoid region. 3.  Appendix appears normal. 4.  No renal or ureteral calculus.  No hydronephrosis. 5. Moderate hiatal hernia. Rather minimal ventral hernia containing only fat. Critical Value/emergent results were called by telephone at the time of interpretation on 06/28/2017 at 2:43 pm to Dr. Virgel Manifold, who verbally acknowledged these results. Electronically Signed   By: Lowella Grip III M.D.   On: 06/28/2017 14:44    Assessment/Plan: Principal Problem:   Diverticulitis Active Problems:   Acute renal injury [facility])    This patient was discussed with the ED physician, including pertinent vitals, physical exam findings, labs, and imaging.  We also discussed care given by the ED provider.  #1 diverticulitis  Admit  I anticipate that the patient will need 48 hours in order to be stable for discharge to home.  Continue ciprofloxacin and Flagyl  Clear liquids  Recheck CBC #2 acute renal injury  Last creatinine was in 2011 which was 1.48. Uncertain as to whether his elevated creatinine is chronic or acute. Will continue IV fluids and recheck creatinine in the morning    DVT prophylaxis: Patient is low risk - will place SCDs Consultants: None  Code Status: Full code  Family Communication:mother in the room Disposition Plan: Patient to return home following admission  Jedediah Noda [place] [facility] Pager [phone]  If 7PM-7AM, please contact night-coverage [email or web address] Password [email or web address]

## 2017-06-28 NOTE — ED Triage Notes (Signed)
Abdominal pain times 3 days. Vomiting since last night.  States he thinks he started having a fever yesterday.   Cyst removed off of right chest last week with sutures still intact.

## 2017-06-29 LAB — BASIC METABOLIC PANEL
Anion gap: 7 (ref 5–15)
BUN: 17 mg/dL (ref 6–20)
CALCIUM: 8.6 mg/dL — AB (ref 8.9–10.3)
CHLORIDE: 105 mmol/L (ref 101–111)
CO2: 25 mmol/L (ref 22–32)
CREATININE: 1.1 mg/dL (ref 0.61–1.24)
GFR calc non Af Amer: >60 mL/min (ref >60)
Glucose, Bld: 100 mg/dL — ABNORMAL HIGH (ref 65–99)
Potassium: 3.8 mmol/L (ref 3.5–5.1)
SODIUM: 137 mmol/L (ref 135–145)

## 2017-06-29 LAB — CBC
HCT: 41.7 % (ref 39.0–52.0)
HEMOGLOBIN: 14.1 g/dL (ref 13.0–17.0)
MCH: 30.4 pg (ref 26.0–34.0)
MCHC: 33.8 g/dL (ref 30.0–36.0)
MCV: 89.9 fL (ref 78.0–100.0)
PLATELETS: 203 10*3/uL (ref 150–400)
RBC: 4.64 MIL/uL (ref 4.22–5.81)
RDW: 13.4 % (ref 11.5–15.5)
WBC: 12.1 10*3/uL — AB (ref 4.0–10.5)

## 2017-06-29 MED ORDER — ONDANSETRON HCL 4 MG/2ML IJ SOLN
4.0000 mg | Freq: Once | INTRAMUSCULAR | Status: AC
  Administered 2017-06-29: 4 mg via INTRAVENOUS

## 2017-06-29 NOTE — Progress Notes (Signed)
[facility]                                                                              Patient Demographics  Elijah Shaffer, is a 36 y.o. male, DOB - September 15, 1981, XNA:[id number]  Admit date - 06/28/2017   Admitting Physician Elijah Mainland, DO  Outpatient Primary MD for the patient is Elijah Gravel, MD  Outpatient specialists:   LOS - 1  days    Chief Complaint  Patient presents with  . Abdominal Pain       Brief summary  Elijah Shaffer is a 36 y.o. male with No significant medical history presenting with 1 week history of progressively worsening right lower abdominal pain with nausea, poor oral intake, no fever or chills. Abdominal CT at the ED revealed diverticulitis with leukocytosis of 21.7 and elevated creatinine of 1.43 Patient admitted for acute diverticulitis   Assessment & Plan    Principal Problem:   Diverticulitis Active Problems:   Acute renal injury ([place])  #1 acute diverticulitis: IV antibiotics Clear liquids-advance diet as tolerated Monitor leukocytosis   #2 acute kidney injury: Likely related to some mild 1 depletion due to diagnosis #1 Gentle hydration Monitor renal function and electrolytes   Code Status: Full  DVT Prophylaxis:  SCD's Family Communication:   Disposition Plan: Home   Time Spent in minutes  32  minutes  Procedures:    Consultants:     Antimicrobials:  Cipro and Flagyl   Medications  Scheduled Meds: . sucralfate  1 g Oral QID   Continuous Infusions: . sodium chloride 125 mL/hr at 06/29/17 0456  . ciprofloxacin 400 mg (06/29/17 0817)  . metronidazole Stopped (06/29/17 0533)   PRN Meds:.acetaminophen **OR** acetaminophen, ketorolac, ondansetron **OR** ondansetron (ZOFRAN) IV   Antibiotics   Anti-infectives    Start     Dose/Rate Route Frequency Ordered Stop   06/28/17 2000  metroNIDAZOLE (FLAGYL) IVPB 500 mg     500 mg 100 mL/hr over 60 Minutes Intravenous Every 8 hours 06/28/17 1950     06/28/17 2000  ciprofloxacin (CIPRO) IVPB 400 mg     400 mg 200 mL/hr over 60 Minutes Intravenous Every 12 hours 06/28/17 1950     06/28/17 1515  ciprofloxacin (CIPRO) IVPB 400 mg     400 mg 200 mL/hr over 60 Minutes Intravenous  Once 06/28/17 1506 06/28/17 1631   06/28/17 1515  metroNIDAZOLE (FLAGYL) IVPB 500 mg     500 mg 100 mL/hr over 60 Minutes Intravenous  Once 06/28/17 1506 06/28/17 1631        Subjective:   Elijah Shaffer was seen and examined today.  H&P reviewed and noted patient denies any fever or chills. Abdominal pain better.   Objective:   Vitals:   06/29/17 0000 06/29/17 0100 06/29/17 0300 06/29/17 0400  BP: 127/72 123/73 116/79 128/80  Pulse: 76 67 65 71  Resp: 17 16 17 16   Temp:    98.5 F (36.9 C)  TempSrc:    Oral  SpO2: 97% 95% 92% 96%  Weight:    108.9 kg (240 lb 1.3 oz)  Height:    6\' 3"  (  1.905 m)    Intake/Output Summary (Last 24 hours) at 06/29/17 1024 Last data filed at 06/29/17 0700  Gross per 24 hour  Intake             2974 ml  Output                0 ml  Net             2974 ml     Wt Readings from Last 3 Encounters:  06/29/17 108.9 kg (240 lb 1.3 oz)  06/16/17 104.3 kg (230 lb)  02/08/17 104.3 kg (230 lb)     Exam  General: NAD  HEENT: NCAT,  PERRL,MMM  Neck: SUPPLE, (-) JVD  Cardiovascular: RRR, (-) GALLOP, (-) MURMUR  Respiratory: CTA  Gastrointestinal: SOFT, (-) DISTENSION, BS(+), (+)  mild right lower quadrant TENDERNESS  Ext: (-) CYANOSIS, (-) EDEMA  Neuro: A, OX 3  Skin:(-) RASH  Psych:NORMAL AFFECT/MOOD   Data Reviewed:  I have personally reviewed following labs and imaging studies  Micro Results No results found for this or any previous visit (from the past 240 hour(s)).  Radiology Reports Dg Neck Soft Tissue  Result Date: 06/16/2017 CLINICAL DATA:  Palpable knot in the neck. EXAM: NECK SOFT TISSUES - 1+ VIEW COMPARISON:  Radiographs of Mar 30, 2006. FINDINGS: There is no evidence of retropharyngeal  soft tissue swelling or epiglottic enlargement. The cervical airway is unremarkable and no radio-opaque foreign body identified. IMPRESSION: Negative. Electronically Signed   By: Marijo Conception, M.D.   On: 06/16/2017 19:50   Ct Abdomen Pelvis W Contrast  Result Date: 06/28/2017 CLINICAL DATA:  Abdominal pain and vomiting EXAM: CT ABDOMEN AND PELVIS WITH CONTRAST TECHNIQUE: Multidetector CT imaging of the abdomen and pelvis was performed using the standard protocol following bolus administration of intravenous contrast. CONTRAST:  119mL ISOVUE-300 IOPAMIDOL (ISOVUE-300) INJECTION 61% COMPARISON:  None. FINDINGS: Lower chest: There is mild bibasilar atelectasis, more on the left than on the right. There is a moderate hiatal hernia. Hepatobiliary: No focal liver lesions are evident. Gallbladder wall does not appear appreciably thickened. There is no biliary duct dilatation. Pancreas: No pancreatic mass or inflammatory focus. Spleen: No splenic lesions are evident. Adrenals/Urinary Tract: Adrenals appear unremarkable bilaterally. Kidneys bilaterally show no evident mass or hydronephrosis on either side. There is no appreciable renal or ureteral calculus on either side. Urinary bladder is midline with wall thickness within normal limits. Stomach/Bowel: There is extensive sigmoid and descending colonic diverticulosis. There is wall thickening in the mid sigmoid colon anteriorly and just to the right of midline with extensive stranding in the adjacent mesenteric consistent with acute diverticulitis. There are a few tiny foci of microperforation in this area. No well-defined abscess. Elsewhere, no bowel wall or mesenteric thickening. No evident bowel obstruction. No free air is seen beyond the micro- perforation in the region of the sigmoid diverticulitis. No portal venous air evident. Vascular/Lymphatic: There is no abdominal aortic aneurysm. No vascular lesions are evident. There is no adenopathy in the abdomen or  pelvis. Reproductive: Prostate and seminal vesicles are normal in size and contour. No evident pelvic mass. Other: Appendix appears normal. No abscess or ascites noted in the abdomen or pelvis. There is a minimal ventral hernia containing only fat. Musculoskeletal: There is slight anterior wedging of the T11 vertebral body. There are no blastic or lytic bone lesions. There is no intramuscular or abdominal wall lesion. IMPRESSION: 1. Focal sigmoid diverticulitis in mid sigmoid colon with wall  thickening and mesenteric thickening in this area. There are foci of microperforation in this area. No abscess. 2. Extensive descending colon and sigmoid diverticulosis. No bowel obstruction. No free air beyond microperforation in the region of diverticulitis in the sigmoid region. 3.  Appendix appears normal. 4.  No renal or ureteral calculus.  No hydronephrosis. 5. Moderate hiatal hernia. Rather minimal ventral hernia containing only fat. Critical Value/emergent results were called by telephone at the time of interpretation on 06/28/2017 at 2:43 pm to Dr. Virgel Manifold, who verbally acknowledged these results. Electronically Signed   By: Lowella Grip III M.D.   On: 06/28/2017 14:44    Lab Data:  CBC:  Recent Labs Lab 06/28/17 1317 06/29/17 0643  WBC 21.7* 12.1*  NEUTROABS 18.0*  --   HGB 15.7 14.1  HCT 46.6 41.7  MCV 89.6 89.9  PLT 234 025   Basic Metabolic Panel:  Recent Labs Lab 06/28/17 1317 06/29/17 0643  NA 136 137  K 3.8 3.8  CL 101 105  CO2 26 25  GLUCOSE 113* 100*  BUN 13 17  CREATININE 1.43* 1.10  CALCIUM 9.6 8.6*   GFR: Estimated Creatinine Clearance: 123.8 mL/min (by C-G formula based on SCr of 1.1 mg/dL). Liver Function Tests:  Recent Labs Lab 06/28/17 1317  AST 25  ALT 31  ALKPHOS 66  BILITOT 2.6*  PROT 8.2*  ALBUMIN 4.5    Recent Labs Lab 06/28/17 1317  LIPASE 34   No results for input(s): AMMONIA in the last 168 hours. Coagulation Profile: No results for  input(s): INR, PROTIME in the last 168 hours. Cardiac Enzymes: No results for input(s): CKTOTAL, CKMB, CKMBINDEX, TROPONINI in the last 168 hours. BNP (last 3 results) No results for input(s): PROBNP in the last 8760 hours. HbA1C: No results for input(s): HGBA1C in the last 72 hours. CBG: No results for input(s): GLUCAP in the last 168 hours. Lipid Profile: No results for input(s): CHOL, HDL, LDLCALC, TRIG, CHOLHDL, LDLDIRECT in the last 72 hours. Thyroid Function Tests: No results for input(s): TSH, T4TOTAL, FREET4, T3FREE, THYROIDAB in the last 72 hours. Anemia Panel: No results for input(s): VITAMINB12, FOLATE, FERRITIN, TIBC, IRON, RETICCTPCT in the last 72 hours. Urine analysis:    Component Value Date/Time   COLORURINE YELLOW 04/18/2010 2028   APPEARANCEUR CLEAR 04/18/2010 2028   LABSPEC >1.030 (H) 04/18/2010 2028   PHURINE 6.0 04/18/2010 2028   GLUCOSEU NEGATIVE 04/18/2010 2028   HGBUR TRACE (A) 04/18/2010 2028   BILIRUBINUR NEGATIVE 04/18/2010 2028   KETONESUR NEGATIVE 04/18/2010 2028   PROTEINUR NEGATIVE 04/18/2010 2028   UROBILINOGEN 0.2 04/18/2010 2028   NITRITE NEGATIVE 04/18/2010 2028   LEUKOCYTESUR NEGATIVE 04/18/2010 2028     OSEI-BONSU,Quamere Mussell M.D. [facility] 06/29/2017, 10:24 AM  Pager: [phone] Between 7am to 7pm - call Pager - [phone]  After 7pm go to [email or web address] - password [email or web address]  Call night coverage person covering after 7pm

## 2017-06-30 DIAGNOSIS — K5732 Diverticulitis of large intestine without perforation or abscess without bleeding: Secondary | ICD-10-CM

## 2017-06-30 LAB — HIV ANTIBODY (ROUTINE TESTING W REFLEX): HIV SCREEN 4TH GENERATION: NONREACTIVE

## 2017-06-30 LAB — CBC
HCT: 41.4 % (ref 39.0–52.0)
HEMOGLOBIN: 13.8 g/dL (ref 13.0–17.0)
MCH: 29.9 pg (ref 26.0–34.0)
MCHC: 33.3 g/dL (ref 30.0–36.0)
MCV: 89.6 fL (ref 78.0–100.0)
PLATELETS: 215 10*3/uL (ref 150–400)
RBC: 4.62 MIL/uL (ref 4.22–5.81)
RDW: 13.2 % (ref 11.5–15.5)
WBC: 11.5 10*3/uL — ABNORMAL HIGH (ref 4.0–10.5)

## 2017-06-30 LAB — BASIC METABOLIC PANEL
ANION GAP: 7 (ref 5–15)
BUN: 20 mg/dL (ref 6–20)
CALCIUM: 8.5 mg/dL — AB (ref 8.9–10.3)
CO2: 25 mmol/L (ref 22–32)
CREATININE: 1.11 mg/dL (ref 0.61–1.24)
Chloride: 105 mmol/L (ref 101–111)
GLUCOSE: 90 mg/dL (ref 65–99)
Potassium: 4 mmol/L (ref 3.5–5.1)
Sodium: 137 mmol/L (ref 135–145)

## 2017-06-30 MED ORDER — ENOXAPARIN SODIUM 40 MG/0.4ML ~~LOC~~ SOLN
40.0000 mg | SUBCUTANEOUS | Status: DC
  Administered 2017-06-30 – 2017-07-02 (×3): 40 mg via SUBCUTANEOUS
  Filled 2017-06-30 (×3): qty 0.4

## 2017-06-30 NOTE — Care Management Note (Signed)
Case Management Note  Patient Details  Name: Elijah Shaffer MRN: [id number] Date of Birth: 1981-01-14  Subjective/Objective:                  Admitted with diverticulitis. Chart reviewed for CM needs. Pt from home, ind, employed and insuranced. Has PCP and transportation. No CM needs noted.   Action/Plan: DC home with self care.  Expected Discharge Date:      07/01/2017            Expected Discharge Plan:  Home/Self Care  In-House Referral:  NA  Discharge planning Services  CM Consult  Post Acute Care Choice:  NA Choice offered to:  NA  Status of Service:  Completed, signed off  Sherald Barge, RN 06/30/2017, 12:18 PM

## 2017-06-30 NOTE — Progress Notes (Signed)
[facility]                                                                              Patient Demographics  Elijah Shaffer, is a 36 y.o. male, DOB - 1980-12-31, IWP:[id number]  Admit date - 06/28/2017   Admitting Physician Truett Mainland, DO  Outpatient Primary MD for the patient is Jani Gravel, MD  Outpatient specialists:   LOS - 2  days    Chief Complaint  Patient presents with  . Abdominal Pain       Brief summary  Elijah Shaffer is a 36 y.o. male with No significant medical history presenting with 1 week history of progressively worsening right lower abdominal pain with nausea, poor oral intake, no fever or chills. Abdominal CT at the ED revealed diverticulitis with leukocytosis of 21.7 and elevated creatinine of 1.43 Patient admitted for acute diverticulitis   Assessment & Plan    Principal Problem:   Diverticulitis Active Problems:   Acute renal injury ([place])  #1 acute diverticulitis:With microperforation IV antibiotics, ciprofloxacin and Flagyl, Transition to npo , as patient still has right lower quadrant pain Leukocytosis improved   #2 acute kidney injury:1.43>1.11 Likely related to some volume depletion due to diagnosis #1 Gentle hydration Monitor renal function and electrolytes   Code Status: Full  DVT Prophylaxis:  SCD's Family Communication: Discussed with mother on the phone by the bedside  Disposition Plan: Likely 1-2 days   Time Spent in minutes  32  minutes  Procedures:    Consultants:     Antimicrobials:  Cipro and Flagyl   Medications  Scheduled Meds: . sucralfate  1 g Oral QID   Continuous Infusions: . sodium chloride 125 mL/hr at 06/30/17 0610  . ciprofloxacin 400 mg (06/30/17 0917)  . metronidazole Stopped (06/30/17 0650)   PRN Meds:.acetaminophen **OR** acetaminophen, ketorolac, ondansetron **OR** ondansetron (ZOFRAN) IV   Antibiotics   Anti-infectives    Start     Dose/Rate Route Frequency Ordered  Stop   06/28/17 2000  metroNIDAZOLE (FLAGYL) IVPB 500 mg     500 mg 100 mL/hr over 60 Minutes Intravenous Every 8 hours 06/28/17 1950     06/28/17 2000  ciprofloxacin (CIPRO) IVPB 400 mg     400 mg 200 mL/hr over 60 Minutes Intravenous Every 12 hours 06/28/17 1950     06/28/17 1515  ciprofloxacin (CIPRO) IVPB 400 mg     400 mg 200 mL/hr over 60 Minutes Intravenous  Once 06/28/17 1506 06/28/17 1631   06/28/17 1515  metroNIDAZOLE (FLAGYL) IVPB 500 mg     500 mg 100 mL/hr over 60 Minutes Intravenous  Once 06/28/17 1506 06/28/17 1631        Subjective:   Elijah Shaffer was seen and examined today.   Still complains of right lower quadrant pain  Objective:   Vitals:   06/29/17 0300 06/29/17 0400 06/29/17 2100 06/30/17 0651  BP: 116/79 128/80 133/82 128/66  Pulse: 65 71 81 62  Resp: 17 16 18 18   Temp:  98.5 F (36.9 C) 99.1 F (37.3 C) 98.2 F (36.8 C)  TempSrc:  Oral  Oral  SpO2: 92%  96% 95% 98%  Weight:  108.9 kg (240 lb 1.3 oz)    Height:  6\' 3"  (1.905 m)      Intake/Output Summary (Last 24 hours) at 06/30/17 0943 Last data filed at 06/30/17 0900  Gross per 24 hour  Intake          3323.42 ml  Output                0 ml  Net          3323.42 ml     Wt Readings from Last 3 Encounters:  06/29/17 108.9 kg (240 lb 1.3 oz)  06/16/17 104.3 kg (230 lb)  02/08/17 104.3 kg (230 lb)     Exam  General: NAD  HEENT: NCAT,  PERRL,MMM  Neck: SUPPLE, (-) JVD  Cardiovascular: RRR, (-) GALLOP, (-) MURMUR  Respiratory: CTA  Gastrointestinal: SOFT, (-) DISTENSION, BS(+), (+)  Right lower quadrant tenderness  Ext: (-) CYANOSIS, (-) EDEMA  Neuro: A, OX 3  Skin:(-) RASH  Psych:NORMAL AFFECT/MOOD   Data Reviewed:  I have personally reviewed following labs and imaging studies  Micro Results No results found for this or any previous visit (from the past 240 hour(s)).  Radiology Reports Dg Neck Soft Tissue  Result Date: 06/16/2017 CLINICAL DATA:  Palpable knot in  the neck. EXAM: NECK SOFT TISSUES - 1+ VIEW COMPARISON:  Radiographs of Mar 30, 2006. FINDINGS: There is no evidence of retropharyngeal soft tissue swelling or epiglottic enlargement. The cervical airway is unremarkable and no radio-opaque foreign body identified. IMPRESSION: Negative. Electronically Signed   By: Marijo Conception, M.D.   On: 06/16/2017 19:50   Ct Abdomen Pelvis W Contrast  Result Date: 06/28/2017 CLINICAL DATA:  Abdominal pain and vomiting EXAM: CT ABDOMEN AND PELVIS WITH CONTRAST TECHNIQUE: Multidetector CT imaging of the abdomen and pelvis was performed using the standard protocol following bolus administration of intravenous contrast. CONTRAST:  148mL ISOVUE-300 IOPAMIDOL (ISOVUE-300) INJECTION 61% COMPARISON:  None. FINDINGS: Lower chest: There is mild bibasilar atelectasis, more on the left than on the right. There is a moderate hiatal hernia. Hepatobiliary: No focal liver lesions are evident. Gallbladder wall does not appear appreciably thickened. There is no biliary duct dilatation. Pancreas: No pancreatic mass or inflammatory focus. Spleen: No splenic lesions are evident. Adrenals/Urinary Tract: Adrenals appear unremarkable bilaterally. Kidneys bilaterally show no evident mass or hydronephrosis on either side. There is no appreciable renal or ureteral calculus on either side. Urinary bladder is midline with wall thickness within normal limits. Stomach/Bowel: There is extensive sigmoid and descending colonic diverticulosis. There is wall thickening in the mid sigmoid colon anteriorly and just to the right of midline with extensive stranding in the adjacent mesenteric consistent with acute diverticulitis. There are a few tiny foci of microperforation in this area. No well-defined abscess. Elsewhere, no bowel wall or mesenteric thickening. No evident bowel obstruction. No free air is seen beyond the micro- perforation in the region of the sigmoid diverticulitis. No portal venous air evident.  Vascular/Lymphatic: There is no abdominal aortic aneurysm. No vascular lesions are evident. There is no adenopathy in the abdomen or pelvis. Reproductive: Prostate and seminal vesicles are normal in size and contour. No evident pelvic mass. Other: Appendix appears normal. No abscess or ascites noted in the abdomen or pelvis. There is a minimal ventral hernia containing only fat. Musculoskeletal: There is slight anterior wedging of the T11 vertebral body. There are no blastic or lytic bone lesions. There is no intramuscular or  abdominal wall lesion. IMPRESSION: 1. Focal sigmoid diverticulitis in mid sigmoid colon with wall thickening and mesenteric thickening in this area. There are foci of microperforation in this area. No abscess. 2. Extensive descending colon and sigmoid diverticulosis. No bowel obstruction. No free air beyond microperforation in the region of diverticulitis in the sigmoid region. 3.  Appendix appears normal. 4.  No renal or ureteral calculus.  No hydronephrosis. 5. Moderate hiatal hernia. Rather minimal ventral hernia containing only fat. Critical Value/emergent results were called by telephone at the time of interpretation on 06/28/2017 at 2:43 pm to Dr. Virgel Manifold, who verbally acknowledged these results. Electronically Signed   By: Lowella Grip III M.D.   On: 06/28/2017 14:44    Lab Data:  CBC:  Recent Labs Lab 06/28/17 1317 06/29/17 0643 06/30/17 0607  WBC 21.7* 12.1* 11.5*  NEUTROABS 18.0*  --   --   HGB 15.7 14.1 13.8  HCT 46.6 41.7 41.4  MCV 89.6 89.9 89.6  PLT 234 203 970   Basic Metabolic Panel:  Recent Labs Lab 06/28/17 1317 06/29/17 0643 06/30/17 0607  NA 136 137 137  K 3.8 3.8 4.0  CL 101 105 105  CO2 26 25 25   GLUCOSE 113* 100* 90  BUN 13 17 20   CREATININE 1.43* 1.10 1.11  CALCIUM 9.6 8.6* 8.5*   GFR: Estimated Creatinine Clearance: 122.7 mL/min (by C-G formula based on SCr of 1.11 mg/dL). Liver Function Tests:  Recent Labs Lab  06/28/17 1317  AST 25  ALT 31  ALKPHOS 66  BILITOT 2.6*  PROT 8.2*  ALBUMIN 4.5    Recent Labs Lab 06/28/17 1317  LIPASE 34   No results for input(s): AMMONIA in the last 168 hours. Coagulation Profile: No results for input(s): INR, PROTIME in the last 168 hours. Cardiac Enzymes: No results for input(s): CKTOTAL, CKMB, CKMBINDEX, TROPONINI in the last 168 hours. BNP (last 3 results) No results for input(s): PROBNP in the last 8760 hours. HbA1C: No results for input(s): HGBA1C in the last 72 hours. CBG: No results for input(s): GLUCAP in the last 168 hours. Lipid Profile: No results for input(s): CHOL, HDL, LDLCALC, TRIG, CHOLHDL, LDLDIRECT in the last 72 hours. Thyroid Function Tests: No results for input(s): TSH, T4TOTAL, FREET4, T3FREE, THYROIDAB in the last 72 hours. Anemia Panel: No results for input(s): VITAMINB12, FOLATE, FERRITIN, TIBC, IRON, RETICCTPCT in the last 72 hours. Urine analysis:    Component Value Date/Time   COLORURINE YELLOW 04/18/2010 2028   APPEARANCEUR CLEAR 04/18/2010 2028   LABSPEC >1.030 (H) 04/18/2010 2028   PHURINE 6.0 04/18/2010 2028   GLUCOSEU NEGATIVE 04/18/2010 2028   HGBUR TRACE (A) 04/18/2010 2028   BILIRUBINUR NEGATIVE 04/18/2010 2028   KETONESUR NEGATIVE 04/18/2010 2028   PROTEINUR NEGATIVE 04/18/2010 2028   UROBILINOGEN 0.2 04/18/2010 2028   NITRITE NEGATIVE 04/18/2010 2028   LEUKOCYTESUR NEGATIVE 04/18/2010 2028     Reyne Dumas M.D. [facility] 06/30/2017, 9:43 AM     After 7pm go to [email or web address] - password [email or web address]  Call night coverage person covering after 7pm

## 2017-07-01 NOTE — Progress Notes (Signed)
[facility]                                                                              Patient Demographics  Elijah Shaffer, is a 36 y.o. male, DOB - 1981-08-12, PZW:[id number]  Admit date - 06/28/2017   Admitting Physician Truett Mainland, DO  Outpatient Primary MD for the patient is Jani Gravel, MD  Outpatient specialists:   LOS - 3  days    Chief Complaint  Patient presents with  . Abdominal Pain       Brief summary  Elijah Shaffer is a 36 y.o. male with No significant medical history presenting with 1 week history of progressively worsening right lower abdominal pain with nausea, poor oral intake, no fever or chills. Abdominal CT at the ED revealed diverticulitis with leukocytosis of 21.7 and elevated creatinine of 1.43 Patient admitted for acute diverticulitis   Assessment & Plan    Principal Problem:   Diverticulitis Active Problems:   Acute renal injury ([place])   Sigmoid diverticulitis  #1 acute diverticulitis : With microperforation Patient is on IV antibiotics, ciprofloxacin and Flagyl, since 8/18, switch to by mouth when able Advance patient to full liquid diet Leukocytosis improved, follow   #2 acute kidney injury: 1.43>1.11 Likely related to some volume depletion due to diagnosis #1 Continue gentle hydration Monitor renal function and electrolytes      Code Status: Full  DVT Prophylaxis:  SCD's Family Communication: Discussed with mother on the phone by the bedside  Disposition Plan: Likely 1-2 days   Time Spent in minutes  32  minutes  Procedures:    Consultants:     Antimicrobials:  Cipro and Flagyl   Medications  Scheduled Meds: . enoxaparin (LOVENOX) injection  40 mg Subcutaneous Q24H  . sucralfate  1 g Oral QID   Continuous Infusions: . sodium chloride 125 mL/hr at 07/01/17 0502  . ciprofloxacin Stopped (07/01/17 0912)  . metronidazole 500 mg (07/01/17 1200)   PRN Meds:.acetaminophen **OR** acetaminophen,  ketorolac, ondansetron **OR** ondansetron (ZOFRAN) IV   Antibiotics   Anti-infectives    Start     Dose/Rate Route Frequency Ordered Stop   06/28/17 2000  metroNIDAZOLE (FLAGYL) IVPB 500 mg     500 mg 100 mL/hr over 60 Minutes Intravenous Every 8 hours 06/28/17 1950     06/28/17 2000  ciprofloxacin (CIPRO) IVPB 400 mg     400 mg 200 mL/hr over 60 Minutes Intravenous Every 12 hours 06/28/17 1950     06/28/17 1515  ciprofloxacin (CIPRO) IVPB 400 mg     400 mg 200 mL/hr over 60 Minutes Intravenous  Once 06/28/17 1506 06/28/17 1631   06/28/17 1515  metroNIDAZOLE (FLAGYL) IVPB 500 mg     500 mg 100 mL/hr over 60 Minutes Intravenous  Once 06/28/17 1506 06/28/17 1631        Subjective:   Elijah Shaffer was seen and examined today.   Still complains of right lower quadrant pain  Objective:   Vitals:   06/30/17 0651 06/30/17 1304 06/30/17 2010 07/01/17 0500  BP: 128/66 132/74 111/74 124/71  Pulse: 62 68 67 68  Resp: 18  18 18 16   Temp: 98.2 F (36.8 C) 98.6 F (37 C) 98.6 F (37 C) 98.2 F (36.8 C)  TempSrc: Oral Oral Oral Oral  SpO2: 98% 98% 97% 96%  Weight:      Height:        Intake/Output Summary (Last 24 hours) at 07/01/17 1215 Last data filed at 07/01/17 0900  Gross per 24 hour  Intake             2725 ml  Output                1 ml  Net             2724 ml     Wt Readings from Last 3 Encounters:  06/29/17 108.9 kg (240 lb 1.3 oz)  06/16/17 104.3 kg (230 lb)  02/08/17 104.3 kg (230 lb)     Exam  General: NAD  HEENT: NCAT,  PERRL,MMM  Neck: SUPPLE, (-) JVD  Cardiovascular: RRR, (-) GALLOP, (-) MURMUR  Respiratory: CTA  Gastrointestinal: SOFT, (-) DISTENSION, BS(+), (+)  Right lower quadrant tenderness  Ext: (-) CYANOSIS, (-) EDEMA  Neuro: A, OX 3  Skin:(-) RASH  Psych:NORMAL AFFECT/MOOD   Data Reviewed:  I have personally reviewed following labs and imaging studies  Micro Results No results found for this or any previous visit (from the  past 240 hour(s)).  Radiology Reports Dg Neck Soft Tissue  Result Date: 06/16/2017 CLINICAL DATA:  Palpable knot in the neck. EXAM: NECK SOFT TISSUES - 1+ VIEW COMPARISON:  Radiographs of Mar 30, 2006. FINDINGS: There is no evidence of retropharyngeal soft tissue swelling or epiglottic enlargement. The cervical airway is unremarkable and no radio-opaque foreign body identified. IMPRESSION: Negative. Electronically Signed   By: Marijo Conception, M.D.   On: 06/16/2017 19:50   Ct Abdomen Pelvis W Contrast  Result Date: 06/28/2017 CLINICAL DATA:  Abdominal pain and vomiting EXAM: CT ABDOMEN AND PELVIS WITH CONTRAST TECHNIQUE: Multidetector CT imaging of the abdomen and pelvis was performed using the standard protocol following bolus administration of intravenous contrast. CONTRAST:  138mL ISOVUE-300 IOPAMIDOL (ISOVUE-300) INJECTION 61% COMPARISON:  None. FINDINGS: Lower chest: There is mild bibasilar atelectasis, more on the left than on the right. There is a moderate hiatal hernia. Hepatobiliary: No focal liver lesions are evident. Gallbladder wall does not appear appreciably thickened. There is no biliary duct dilatation. Pancreas: No pancreatic mass or inflammatory focus. Spleen: No splenic lesions are evident. Adrenals/Urinary Tract: Adrenals appear unremarkable bilaterally. Kidneys bilaterally show no evident mass or hydronephrosis on either side. There is no appreciable renal or ureteral calculus on either side. Urinary bladder is midline with wall thickness within normal limits. Stomach/Bowel: There is extensive sigmoid and descending colonic diverticulosis. There is wall thickening in the mid sigmoid colon anteriorly and just to the right of midline with extensive stranding in the adjacent mesenteric consistent with acute diverticulitis. There are a few tiny foci of microperforation in this area. No well-defined abscess. Elsewhere, no bowel wall or mesenteric thickening. No evident bowel obstruction. No  free air is seen beyond the micro- perforation in the region of the sigmoid diverticulitis. No portal venous air evident. Vascular/Lymphatic: There is no abdominal aortic aneurysm. No vascular lesions are evident. There is no adenopathy in the abdomen or pelvis. Reproductive: Prostate and seminal vesicles are normal in size and contour. No evident pelvic mass. Other: Appendix appears normal. No abscess or ascites noted in the abdomen or pelvis. There is a minimal ventral  hernia containing only fat. Musculoskeletal: There is slight anterior wedging of the T11 vertebral body. There are no blastic or lytic bone lesions. There is no intramuscular or abdominal wall lesion. IMPRESSION: 1. Focal sigmoid diverticulitis in mid sigmoid colon with wall thickening and mesenteric thickening in this area. There are foci of microperforation in this area. No abscess. 2. Extensive descending colon and sigmoid diverticulosis. No bowel obstruction. No free air beyond microperforation in the region of diverticulitis in the sigmoid region. 3.  Appendix appears normal. 4.  No renal or ureteral calculus.  No hydronephrosis. 5. Moderate hiatal hernia. Rather minimal ventral hernia containing only fat. Critical Value/emergent results were called by telephone at the time of interpretation on 06/28/2017 at 2:43 pm to Dr. Virgel Manifold, who verbally acknowledged these results. Electronically Signed   By: Lowella Grip III M.D.   On: 06/28/2017 14:44    Lab Data:  CBC:  Recent Labs Lab 06/28/17 1317 06/29/17 0643 06/30/17 0607  WBC 21.7* 12.1* 11.5*  NEUTROABS 18.0*  --   --   HGB 15.7 14.1 13.8  HCT 46.6 41.7 41.4  MCV 89.6 89.9 89.6  PLT 234 203 169   Basic Metabolic Panel:  Recent Labs Lab 06/28/17 1317 06/29/17 0643 06/30/17 0607  NA 136 137 137  K 3.8 3.8 4.0  CL 101 105 105  CO2 26 25 25   GLUCOSE 113* 100* 90  BUN 13 17 20   CREATININE 1.43* 1.10 1.11  CALCIUM 9.6 8.6* 8.5*   GFR: Estimated Creatinine  Clearance: 122.7 mL/min (by C-G formula based on SCr of 1.11 mg/dL). Liver Function Tests:  Recent Labs Lab 06/28/17 1317  AST 25  ALT 31  ALKPHOS 66  BILITOT 2.6*  PROT 8.2*  ALBUMIN 4.5    Recent Labs Lab 06/28/17 1317  LIPASE 34   No results for input(s): AMMONIA in the last 168 hours. Coagulation Profile: No results for input(s): INR, PROTIME in the last 168 hours. Cardiac Enzymes: No results for input(s): CKTOTAL, CKMB, CKMBINDEX, TROPONINI in the last 168 hours. BNP (last 3 results) No results for input(s): PROBNP in the last 8760 hours. HbA1C: No results for input(s): HGBA1C in the last 72 hours. CBG: No results for input(s): GLUCAP in the last 168 hours. Lipid Profile: No results for input(s): CHOL, HDL, LDLCALC, TRIG, CHOLHDL, LDLDIRECT in the last 72 hours. Thyroid Function Tests: No results for input(s): TSH, T4TOTAL, FREET4, T3FREE, THYROIDAB in the last 72 hours. Anemia Panel: No results for input(s): VITAMINB12, FOLATE, FERRITIN, TIBC, IRON, RETICCTPCT in the last 72 hours. Urine analysis:    Component Value Date/Time   COLORURINE YELLOW 04/18/2010 2028   APPEARANCEUR CLEAR 04/18/2010 2028   LABSPEC >1.030 (H) 04/18/2010 2028   PHURINE 6.0 04/18/2010 2028   GLUCOSEU NEGATIVE 04/18/2010 2028   HGBUR TRACE (A) 04/18/2010 2028   BILIRUBINUR NEGATIVE 04/18/2010 2028   KETONESUR NEGATIVE 04/18/2010 2028   PROTEINUR NEGATIVE 04/18/2010 2028   UROBILINOGEN 0.2 04/18/2010 2028   NITRITE NEGATIVE 04/18/2010 2028   LEUKOCYTESUR NEGATIVE 04/18/2010 2028     Reyne Dumas M.D. [facility] 07/01/2017, 12:15 PM     After 7pm go to [email or web address] - password [email or web address]  Call night coverage person covering after 7pm

## 2017-07-01 NOTE — Progress Notes (Signed)
Patient stated that he ate some soup and pudding for dinner. Denies pain and nausea during and after meal. Patient states that he did feel hot and diaphoretic for approximately 30-40 minutes after eating. Denies at this time. No distress noted.

## 2017-07-02 LAB — CBC
HCT: 40.4 % (ref 39.0–52.0)
HEMOGLOBIN: 13.8 g/dL (ref 13.0–17.0)
MCH: 30.2 pg (ref 26.0–34.0)
MCHC: 34.2 g/dL (ref 30.0–36.0)
MCV: 88.4 fL (ref 78.0–100.0)
Platelets: 287 10*3/uL (ref 150–400)
RBC: 4.57 MIL/uL (ref 4.22–5.81)
RDW: 12.8 % (ref 11.5–15.5)
WBC: 9.3 10*3/uL (ref 4.0–10.5)

## 2017-07-02 LAB — COMPREHENSIVE METABOLIC PANEL
ALK PHOS: 52 U/L (ref 38–126)
ALT: 21 U/L (ref 17–63)
ANION GAP: 8 (ref 5–15)
AST: 17 U/L (ref 15–41)
Albumin: 3.4 g/dL — ABNORMAL LOW (ref 3.5–5.0)
BILIRUBIN TOTAL: 0.6 mg/dL (ref 0.3–1.2)
BUN: 14 mg/dL (ref 6–20)
CALCIUM: 8.8 mg/dL — AB (ref 8.9–10.3)
CO2: 26 mmol/L (ref 22–32)
CREATININE: 1.25 mg/dL — AB (ref 0.61–1.24)
Chloride: 106 mmol/L (ref 101–111)
Glucose, Bld: 79 mg/dL (ref 65–99)
Potassium: 3.7 mmol/L (ref 3.5–5.1)
SODIUM: 140 mmol/L (ref 135–145)
TOTAL PROTEIN: 6.4 g/dL — AB (ref 6.5–8.1)

## 2017-07-02 LAB — B. BURGDORFI ANTIBODIES: B burgdorferi Ab IgG+IgM: <0.91 {ISR} (ref 0.00–0.90)

## 2017-07-02 MED ORDER — CIPROFLOXACIN HCL 500 MG PO TABS: 500.0000 mg | ORAL_TABLET | Freq: Two times a day (BID) | ORAL | 0 refills | Status: AC

## 2017-07-02 MED ORDER — METRONIDAZOLE 500 MG PO TABS: 500.0000 mg | ORAL_TABLET | Freq: Three times a day (TID) | ORAL | 0 refills | Status: AC

## 2017-07-02 NOTE — Progress Notes (Signed)
Discharge instructions read to patient and his mother.  Both verbalized understanding of instructions.  Discharged to home with mother. 

## 2017-07-02 NOTE — Discharge Summary (Signed)
Physician Discharge Summary  [name]:[id number] DOB: [birth year] DOA: 06/28/2017  PCP: Elijah Gravel, MD  Admit date: 06/28/2017 Discharge date: 07/02/2017  Admitted From: home Disposition:  home  Recommendations for Outpatient Follow-up:  1. Follow up with PCP in 1-2 weeks 2. Please obtain BMP/CBC in one week   Discharge Condition: stable CODE STATUS:full code Diet recommendation: Heart Healthy  Brief/Interim Summary: Elijah Lesko Hubbardis a 36 y.o.malewith No significant medical history presenting with 1 week history of progressively worsening right lower abdominal pain with nausea, poor oral intake, no fever or chills. Abdominal CT at the ED revealed diverticulitis With likely microperforation, but no abscess and had leukocytosis of 21.7 and elevated creatinine of 1.43 Patient admitted for acute diverticulitis.He was treated with intravenous antibiotics. His abdominal pain has since improved. Leukocytosis has resolved. He is not having any fevers. His diet was slowly advanced to solid food and he appears to be tolerating this well. Patient will be discharged home on a two-week course of oral antibiotics. He is otherwise stable for discharge.  Discharge Diagnoses:  Principal Problem:   Diverticulitis Active Problems:   Acute renal injury [facility])   Sigmoid diverticulitis    Discharge Instructions  Discharge Instructions    Diet - low sodium heart healthy    Complete by:  As directed    Increase activity slowly    Complete by:  As directed      Allergies as of 07/02/2017      Reactions   Claritin [loratadine] Other (See Comments)   Heart race      Medication List    TAKE these medications   ciprofloxacin 500 MG tablet Commonly known as:  CIPRO Take 1 tablet (500 mg total) by mouth 2 (two) times daily.   diphenhydrAMINE 25 MG tablet Commonly known as:  BENADRYL Take 25 mg by mouth once.   ibuprofen 200 MG tablet Commonly known as:  ADVIL,MOTRIN Take 200 mg by  mouth every 6 (six) hours as needed for moderate pain.   metroNIDAZOLE 500 MG tablet Commonly known as:  FLAGYL Take 1 tablet (500 mg total) by mouth 3 (three) times daily.   sucralfate 1 g tablet Commonly known as:  CARAFATE Take 1 tablet by mouth 4 (four) times daily.            Discharge Care Instructions        Start     Ordered   07/02/17 0000  ciprofloxacin (CIPRO) 500 MG tablet  2 times daily     07/02/17 1528   07/02/17 0000  metroNIDAZOLE (FLAGYL) 500 MG tablet  3 times daily     07/02/17 1528   07/02/17 0000  Increase activity slowly     07/02/17 1528   07/02/17 0000  Diet - low sodium heart healthy     07/02/17 1528      Allergies  Allergen Reactions  . Claritin [Loratadine] Other (See Comments)    Heart race    Consultations:     Procedures/Studies: Dg Neck Soft Tissue  Result Date: 06/16/2017 CLINICAL DATA:  Palpable knot in the neck. EXAM: NECK SOFT TISSUES - 1+ VIEW COMPARISON:  Radiographs of Mar 30, 2006. FINDINGS: There is no evidence of retropharyngeal soft tissue swelling or epiglottic enlargement. The cervical airway is unremarkable and no radio-opaque foreign body identified. IMPRESSION: Negative. Electronically Signed   By: Marijo Conception, M.D.   On: 06/16/2017 19:50   Ct Abdomen Pelvis W Contrast  Result Date: 06/28/2017 CLINICAL DATA:  Abdominal pain and vomiting EXAM: CT ABDOMEN AND PELVIS WITH CONTRAST TECHNIQUE: Multidetector CT imaging of the abdomen and pelvis was performed using the standard protocol following bolus administration of intravenous contrast. CONTRAST:  162mL ISOVUE-300 IOPAMIDOL (ISOVUE-300) INJECTION 61% COMPARISON:  None. FINDINGS: Lower chest: There is mild bibasilar atelectasis, more on the left than on the right. There is a moderate hiatal hernia. Hepatobiliary: No focal liver lesions are evident. Gallbladder wall does not appear appreciably thickened. There is no biliary duct dilatation. Pancreas: No pancreatic mass  or inflammatory focus. Spleen: No splenic lesions are evident. Adrenals/Urinary Tract: Adrenals appear unremarkable bilaterally. Kidneys bilaterally show no evident mass or hydronephrosis on either side. There is no appreciable renal or ureteral calculus on either side. Urinary bladder is midline with wall thickness within normal limits. Stomach/Bowel: There is extensive sigmoid and descending colonic diverticulosis. There is wall thickening in the mid sigmoid colon anteriorly and just to the right of midline with extensive stranding in the adjacent mesenteric consistent with acute diverticulitis. There are a few tiny foci of microperforation in this area. No well-defined abscess. Elsewhere, no bowel wall or mesenteric thickening. No evident bowel obstruction. No free air is seen beyond the micro- perforation in the region of the sigmoid diverticulitis. No portal venous air evident. Vascular/Lymphatic: There is no abdominal aortic aneurysm. No vascular lesions are evident. There is no adenopathy in the abdomen or pelvis. Reproductive: Prostate and seminal vesicles are normal in size and contour. No evident pelvic mass. Other: Appendix appears normal. No abscess or ascites noted in the abdomen or pelvis. There is a minimal ventral hernia containing only fat. Musculoskeletal: There is slight anterior wedging of the T11 vertebral body. There are no blastic or lytic bone lesions. There is no intramuscular or abdominal wall lesion. IMPRESSION: 1. Focal sigmoid diverticulitis in mid sigmoid colon with wall thickening and mesenteric thickening in this area. There are foci of microperforation in this area. No abscess. 2. Extensive descending colon and sigmoid diverticulosis. No bowel obstruction. No free air beyond microperforation in the region of diverticulitis in the sigmoid region. 3.  Appendix appears normal. 4.  No renal or ureteral calculus.  No hydronephrosis. 5. Moderate hiatal hernia. Rather minimal ventral hernia  containing only fat. Critical Value/emergent results were called by telephone at the time of interpretation on 06/28/2017 at 2:43 pm to Dr. Virgel Manifold, who verbally acknowledged these results. Electronically Signed   By: Lowella Grip III M.D.   On: 06/28/2017 14:44       Subjective: Feeling better. Abdominal pain improving. Tolerating diet  Discharge Exam: Vitals:   07/02/17 0500 07/02/17 1458  BP: 128/66 136/71  Pulse: (!) 58 67  Resp: 18 18  Temp: 98.4 F (36.9 C) 99.2 F (37.3 C)  SpO2: 99% 98%   Vitals:   07/01/17 1350 07/01/17 2100 07/02/17 0500 07/02/17 1458  BP: 131/80  128/66 136/71  Pulse: 75  (!) 58 67  Resp: 18  18 18   Temp: 98.8 F (37.1 C)  98.4 F (36.9 C) 99.2 F (37.3 C)  TempSrc:   Oral Oral  SpO2: 94% 95% 99% 98%  Weight:      Height:        General: Pt is alert, awake, not in acute distress Cardiovascular: RRR, S1/S2 +, no rubs, no gallops Respiratory: CTA bilaterally, no wheezing, no rhonchi Abdominal: Soft, NT, ND, bowel sounds + Extremities: no edema, no cyanosis    The results of significant diagnostics from this hospitalization (including imaging,  microbiology, ancillary and laboratory) are listed below for reference.     Microbiology: No results found for this or any previous visit (from the past 240 hour(s)).   Labs: BNP (last 3 results) No results for input(s): BNP in the last 8760 hours. Basic Metabolic Panel:  Recent Labs Lab 06/28/17 1317 06/29/17 0643 06/30/17 0607 07/02/17 0408  NA 136 137 137 140  K 3.8 3.8 4.0 3.7  CL 101 105 105 106  CO2 26 25 25 26   GLUCOSE 113* 100* 90 79  BUN 13 17 20 14   CREATININE 1.43* 1.10 1.11 1.25*  CALCIUM 9.6 8.6* 8.5* 8.8*   Liver Function Tests:  Recent Labs Lab 06/28/17 1317 07/02/17 0408  AST 25 17  ALT 31 21  ALKPHOS 66 52  BILITOT 2.6* 0.6  PROT 8.2* 6.4*  ALBUMIN 4.5 3.4*    Recent Labs Lab 06/28/17 1317  LIPASE 34   No results for input(s): AMMONIA in  the last 168 hours. CBC:  Recent Labs Lab 06/28/17 1317 06/29/17 0643 06/30/17 0607 07/02/17 0408  WBC 21.7* 12.1* 11.5* 9.3  NEUTROABS 18.0*  --   --   --   HGB 15.7 14.1 13.8 13.8  HCT 46.6 41.7 41.4 40.4  MCV 89.6 89.9 89.6 88.4  PLT 234 203 215 287   Cardiac Enzymes: No results for input(s): CKTOTAL, CKMB, CKMBINDEX, TROPONINI in the last 168 hours. BNP: Invalid input(s): POCBNP CBG: No results for input(s): GLUCAP in the last 168 hours. D-Dimer No results for input(s): DDIMER in the last 72 hours. Hgb A1c No results for input(s): HGBA1C in the last 72 hours. Lipid Profile No results for input(s): CHOL, HDL, LDLCALC, TRIG, CHOLHDL, LDLDIRECT in the last 72 hours. Thyroid function studies No results for input(s): TSH, T4TOTAL, T3FREE, THYROIDAB in the last 72 hours.  Invalid input(s): FREET3 Anemia work up No results for input(s): VITAMINB12, FOLATE, FERRITIN, TIBC, IRON, RETICCTPCT in the last 72 hours. Urinalysis    Component Value Date/Time   COLORURINE YELLOW 04/18/2010 2028   APPEARANCEUR CLEAR 04/18/2010 2028   LABSPEC >1.030 (H) 04/18/2010 2028   PHURINE 6.0 04/18/2010 2028   GLUCOSEU NEGATIVE 04/18/2010 2028   HGBUR TRACE (A) 04/18/2010 2028   BILIRUBINUR NEGATIVE 04/18/2010 2028   KETONESUR NEGATIVE 04/18/2010 2028   PROTEINUR NEGATIVE 04/18/2010 2028   UROBILINOGEN 0.2 04/18/2010 2028   NITRITE NEGATIVE 04/18/2010 2028   LEUKOCYTESUR NEGATIVE 04/18/2010 2028   Sepsis Labs Invalid input(s): PROCALCITONIN,  WBC,  LACTICIDVEN Microbiology No results found for this or any previous visit (from the past 240 hour(s)).   Time coordinating discharge: Over 30 minutes  SIGNED:   Kathie Dike, MD  [facility] 07/02/2017, 3:29 PM Pager   If 7PM-7AM, please contact night-coverage [email or web address] Password [email or web address]

## 2017-07-22 DIAGNOSIS — K5792 Diverticulitis of intestine, part unspecified, without perforation or abscess without bleeding: Secondary | ICD-10-CM | POA: Diagnosis not present

## 2017-07-22 DIAGNOSIS — K219 Gastro-esophageal reflux disease without esophagitis: Secondary | ICD-10-CM | POA: Diagnosis not present

## 2017-07-22 DIAGNOSIS — J309 Allergic rhinitis, unspecified: Secondary | ICD-10-CM | POA: Diagnosis not present

## 2017-07-22 DIAGNOSIS — K449 Diaphragmatic hernia without obstruction or gangrene: Secondary | ICD-10-CM | POA: Diagnosis not present

## 2017-08-01 DIAGNOSIS — K5792 Diverticulitis of intestine, part unspecified, without perforation or abscess without bleeding: Secondary | ICD-10-CM | POA: Diagnosis not present

## 2017-08-01 DIAGNOSIS — E860 Dehydration: Secondary | ICD-10-CM | POA: Diagnosis not present

## 2017-08-05 DIAGNOSIS — K5792 Diverticulitis of intestine, part unspecified, without perforation or abscess without bleeding: Secondary | ICD-10-CM | POA: Diagnosis not present

## 2017-08-05 DIAGNOSIS — R1084 Generalized abdominal pain: Secondary | ICD-10-CM | POA: Diagnosis not present

## 2017-08-05 DIAGNOSIS — K449 Diaphragmatic hernia without obstruction or gangrene: Secondary | ICD-10-CM | POA: Diagnosis not present

## 2017-08-20 ENCOUNTER — Encounter (INDEPENDENT_AMBULATORY_CARE_PROVIDER_SITE_OTHER): Payer: Self-pay | Admitting: Internal Medicine

## 2017-08-20 ENCOUNTER — Encounter (INDEPENDENT_AMBULATORY_CARE_PROVIDER_SITE_OTHER): Payer: Self-pay

## 2017-09-15 ENCOUNTER — Encounter (INDEPENDENT_AMBULATORY_CARE_PROVIDER_SITE_OTHER): Payer: Self-pay | Admitting: Internal Medicine

## 2017-09-15 ENCOUNTER — Ambulatory Visit (INDEPENDENT_AMBULATORY_CARE_PROVIDER_SITE_OTHER): Payer: BLUE CROSS/BLUE SHIELD | Admitting: Internal Medicine

## 2017-09-15 ENCOUNTER — Telehealth (INDEPENDENT_AMBULATORY_CARE_PROVIDER_SITE_OTHER): Payer: Self-pay | Admitting: *Deleted

## 2017-09-15 ENCOUNTER — Other Ambulatory Visit (INDEPENDENT_AMBULATORY_CARE_PROVIDER_SITE_OTHER): Payer: Self-pay | Admitting: Internal Medicine

## 2017-09-15 ENCOUNTER — Encounter (INDEPENDENT_AMBULATORY_CARE_PROVIDER_SITE_OTHER): Payer: Self-pay | Admitting: *Deleted

## 2017-09-15 VITALS — BP 122/86 | HR 72 | Temp 98.1°F | Ht 75.0 in | Wt 239.1 lb

## 2017-09-15 DIAGNOSIS — K5732 Diverticulitis of large intestine without perforation or abscess without bleeding: Secondary | ICD-10-CM

## 2017-09-15 LAB — COMPREHENSIVE METABOLIC PANEL
AG RATIO: 1.6 (calc) (ref 1.0–2.5)
ALKALINE PHOSPHATASE (APISO): 58 U/L (ref 40–115)
ALT: 30 U/L (ref 9–46)
AST: 18 U/L (ref 10–40)
Albumin: 4.6 g/dL (ref 3.6–5.1)
BILIRUBIN TOTAL: 0.6 mg/dL (ref 0.2–1.2)
BUN: 12 mg/dL (ref 7–25)
CALCIUM: 9.8 mg/dL (ref 8.6–10.3)
CO2: 30 mmol/L (ref 20–32)
Chloride: 104 mmol/L (ref 98–110)
Creat: 1.24 mg/dL (ref 0.60–1.35)
Globulin: 2.8 g/dL (calc) (ref 1.9–3.7)
Glucose, Bld: 93 mg/dL (ref 65–99)
Potassium: 4.4 mmol/L (ref 3.5–5.3)
SODIUM: 140 mmol/L (ref 135–146)
Total Protein: 7.4 g/dL (ref 6.1–8.1)

## 2017-09-15 LAB — CBC WITH DIFFERENTIAL/PLATELET
BASOS ABS: 94 {cells}/uL (ref 0–200)
Basophils Relative: 1.2 %
EOS PCT: 5.5 %
Eosinophils Absolute: 429 cells/uL (ref 15–500)
HCT: 46.8 % (ref 38.5–50.0)
HEMOGLOBIN: 16.4 g/dL (ref 13.2–17.1)
Lymphs Abs: 2239 cells/uL (ref 850–3900)
MCH: 30 pg (ref 27.0–33.0)
MCHC: 35 g/dL (ref 32.0–36.0)
MCV: 85.6 fL (ref 80.0–100.0)
MONOS PCT: 6.9 %
MPV: 11.7 fL (ref 7.5–12.5)
NEUTROS ABS: 4501 {cells}/uL (ref 1500–7800)
Neutrophils Relative %: 57.7 %
Platelets: 247 10*3/uL (ref 140–400)
RBC: 5.47 10*6/uL (ref 4.20–5.80)
RDW: 12.5 % (ref 11.0–15.0)
Total Lymphocyte: 28.7 %
WBC mixed population: 538 cells/uL (ref 200–950)
WBC: 7.8 10*3/uL (ref 3.8–10.8)

## 2017-09-15 MED ORDER — PEG 3350-KCL-NA BICARB-NACL 420 G PO SOLR: 4000.0000 mL | Freq: Once | ORAL | 0 refills | Status: DC

## 2017-09-15 NOTE — Progress Notes (Signed)
   Subjective:    Patient ID: Elijah Shaffer, male    DOB: [birth year], 36 y.o.   MRN: [id number]  HPIReferred by Dr Maudie Mercury for abd pain/diverticulitis.  Admitted to [facility] in August for diverticulitis.  Treated with IV antibiotics.  He improved and was discharged on with Flagyl and Cipro x 14 days. Underwent a CT scan in August for abdominal pain/vomiting. CT revealed  IMPRESSION: 1. Focal sigmoid diverticulitis in mid sigmoid colon with wall thickening and mesenteric thickening in this area. There are foci of microperforation in this area. No abscess.  2. Extensive descending colon and sigmoid diverticulosis. No bowel obstruction. No free air beyond microperforation in the region of diverticulitis in the sigmoid region. He denies prior hx of diverticulitis. He occasionally has some abdominal pain. He feels 80% better. No chills or fever. Having a BM x 2 a day. Sometimes he has to force the BM out. Appetite is okay. No weight loss that he know of.  Both grandmothers had colon cancer in their late 67s.     Review of Systems Past Medical History:  Diagnosis Date  . Diverticulitis     No past surgical history on file.  Allergies  Allergen Reactions  . Claritin [Loratadine] Other (See Comments)    Heart race    Current Outpatient Medications on File Prior to Visit  Medication Sig Dispense Refill  . diphenhydrAMINE (BENADRYL) 25 MG tablet Take 25 mg by mouth once.    [name] ibuprofen (ADVIL,MOTRIN) 200 MG tablet Take 200 mg by mouth every 6 (six) hours as needed for moderate pain.    [name] sucralfate (CARAFATE) 1 g tablet Take 1 tablet by mouth 4 (four) times daily.  0   No current facility-administered medications on file prior to visit.         Objective:   Physical Exam Blood pressure 122/86, pulse 72, temperature 98.1 F (36.7 C), height 6\' 3"  (1.905 m), weight 239 lb 1.6 oz (108.5 kg). Alert and oriented. Skin warm and dry. Oral mucosa is moist.   . Sclera anicteric, conjunctivae  is pink. Thyroid not enlarged. No cervical lymphadenopathy. Lungs clear. Heart regular rate and rhythm.  Abdomen is soft. Bowel sounds are positive. No hepatomegaly. No abdominal masses felt. No tenderness.  No edema to lower extremities.          Assessment & Plan:  Diverticulitis. Underlying neoplasm needs to be ruled out. Colonoscopy. The risks of bleeding, perforation and infection were reviewed with patient.

## 2017-09-15 NOTE — Patient Instructions (Addendum)
Colonoscopy. The risks of bleeding, perforation and infection were reviewed with patient.  Diverticulitis Diverticulitis is when small pockets in your large intestine (colon) get infected or swollen. This causes stomach pain and watery poop (diarrhea). These pouches are called diverticula. They form in people who have a condition called diverticulosis. Follow these instructions at home: Medicines  Take over-the-counter and prescription medicines only as told by your doctor. These include: ? Antibiotics. ? Pain medicines. ? Fiber pills. ? Probiotics. ? Stool softeners.  Do not drive or use heavy machinery while taking prescription pain medicine.  If you were prescribed an antibiotic, take it as told. Do not stop taking it even if you feel better. General instructions  Follow a diet as told by your doctor.  When you feel better, your doctor may tell you to change your diet. You may need to eat a lot of fiber. Fiber makes it easier to poop (have bowel movements). Healthy foods with fiber include: ? Berries. ? Beans. ? Lentils. ? Green vegetables.  Exercise 3 or more times a week. Aim for 30 minutes each time. Exercise enough to sweat and make your heart beat faster.  Keep all follow-up visits as told. This is important. You may need to have an exam of the large intestine. This is called a colonoscopy. Contact a doctor if:  Your pain does not get better.  You have a hard time eating or drinking.  You are not pooping like normal. Get help right away if:  Your pain gets worse.  Your problems do not get better.  Your problems get worse very fast.  You have a fever.  You throw up (vomit) more than one time.  You have poop that is: ? Bloody. ? Black. ? Tarry. Summary  Diverticulitis is when small pockets in your large intestine (colon) get infected or swollen.  Take medicines only as told by your doctor.  Follow a diet as told by your doctor. This information is  not intended to replace advice given to you by your health care provider. Make sure you discuss any questions you have with your health care provider. Document Released: 04/15/2008 Document Revised: 11/14/2016 Document Reviewed: 11/14/2016 Elsevier Interactive Patient Education  2017 [employer].

## 2017-09-15 NOTE — Telephone Encounter (Signed)
Patient needs trilyte 

## 2017-09-22 ENCOUNTER — Ambulatory Visit (HOSPITAL_COMMUNITY)
Admission: RE | Admit: 2017-09-22 | Discharge: 2017-09-22 | Disposition: A | Discharge status: Home / Self Care | Payer: BLUE CROSS/BLUE SHIELD | Source: Ambulatory Visit | Attending: Internal Medicine | Admitting: Internal Medicine

## 2017-09-22 ENCOUNTER — Encounter (HOSPITAL_COMMUNITY)
Admission: RE | Disposition: A | Discharge status: Home / Self Care | Payer: Self-pay | Source: Ambulatory Visit | Attending: Internal Medicine

## 2017-09-22 ENCOUNTER — Other Ambulatory Visit: Payer: Self-pay

## 2017-09-22 ENCOUNTER — Encounter (HOSPITAL_COMMUNITY): Payer: Self-pay | Admitting: *Deleted

## 2017-09-22 DIAGNOSIS — K5732 Diverticulitis of large intestine without perforation or abscess without bleeding: Secondary | ICD-10-CM

## 2017-09-22 DIAGNOSIS — D123 Benign neoplasm of transverse colon: Secondary | ICD-10-CM | POA: Insufficient documentation

## 2017-09-22 DIAGNOSIS — K5730 Diverticulosis of large intestine without perforation or abscess without bleeding: Secondary | ICD-10-CM | POA: Insufficient documentation

## 2017-09-22 DIAGNOSIS — D122 Benign neoplasm of ascending colon: Secondary | ICD-10-CM | POA: Diagnosis not present

## 2017-09-22 DIAGNOSIS — Z09 Encounter for follow-up examination after completed treatment for conditions other than malignant neoplasm: Secondary | ICD-10-CM | POA: Diagnosis not present

## 2017-09-22 DIAGNOSIS — Z79899 Other long term (current) drug therapy: Secondary | ICD-10-CM | POA: Diagnosis not present

## 2017-09-22 DIAGNOSIS — K648 Other hemorrhoids: Secondary | ICD-10-CM | POA: Insufficient documentation

## 2017-09-22 HISTORY — DX: Other seasonal allergic rhinitis: J30.2

## 2017-09-22 HISTORY — PX: COLONOSCOPY: SHX5424

## 2017-09-22 HISTORY — PX: POLYPECTOMY: SHX5525

## 2017-09-22 SURGERY — COLONOSCOPY
Anesthesia: Moderate Sedation

## 2017-09-22 MED ORDER — IBUPROFEN 200 MG PO TABS: 400.0000 mg | ORAL_TABLET | Freq: Three times a day (TID) | ORAL | 0 refills | Status: DC | PRN

## 2017-09-22 MED ORDER — MEPERIDINE HCL 50 MG/ML IJ SOLN
INTRAMUSCULAR | Status: AC
  Filled 2017-09-22: qty 1

## 2017-09-22 MED ORDER — DICYCLOMINE HCL 10 MG PO CAPS: 10.0000 mg | ORAL_CAPSULE | Freq: Two times a day (BID) | ORAL | 5 refills | Status: DC | PRN

## 2017-09-22 MED ORDER — MIDAZOLAM HCL 5 MG/5ML IJ SOLN
INTRAMUSCULAR | Status: AC
  Filled 2017-09-22: qty 10

## 2017-09-22 MED ORDER — MIDAZOLAM HCL 5 MG/5ML IJ SOLN
INTRAMUSCULAR | Status: DC | PRN
  Administered 2017-09-22 (×4): 2 mg via INTRAVENOUS

## 2017-09-22 MED ORDER — SODIUM CHLORIDE 0.9 % IV SOLN
INTRAVENOUS | Status: DC
  Administered 2017-09-22: 09:00:00 via INTRAVENOUS

## 2017-09-22 MED ORDER — MEPERIDINE HCL 50 MG/ML IJ SOLN
INTRAMUSCULAR | Status: DC | PRN
  Administered 2017-09-22 (×4): 25 mg

## 2017-09-22 MED ORDER — PANTOPRAZOLE SODIUM 40 MG PO TBEC: 40.0000 mg | DELAYED_RELEASE_TABLET | Freq: Every day | ORAL | 5 refills | Status: DC

## 2017-09-22 NOTE — Discharge Instructions (Signed)
No aspirin or NSAIDs for 1 week. Resume usual medications as before. Pantoprazole 40 mg by mouth 30 minutes before breakfast daily. Dicyclomine 10 mg p.o. two times daily as needed for lower abdominal pain on as-needed basis. High fiber diet. No driving for 24 hours. Physician  will call with biopsy results.   High-Fiber Diet Fiber, also called dietary fiber, is a type of carbohydrate found in fruits, vegetables, whole grains, and beans. A high-fiber diet can have many health benefits. Your health care provider may recommend a high-fiber diet to help:  Prevent constipation. Fiber can make your bowel movements more regular.  Lower your cholesterol.  Relieve hemorrhoids, uncomplicated diverticulosis, or irritable bowel syndrome.  Prevent overeating as part of a weight-loss plan.  Prevent heart disease, type 2 diabetes, and certain cancers.  What is my plan? The recommended daily intake of fiber includes:  38 grams for men under age 77.  12 grams for men over age 55.  44 grams for women under age 35.  55 grams for women over age 15.  You can get the recommended daily intake of dietary fiber by eating a variety of fruits, vegetables, grains, and beans. Your health care provider may also recommend a fiber supplement if it is not possible to get enough fiber through your diet. What do I need to know about a high-fiber diet?  Fiber supplements have not been widely studied for their effectiveness, so it is better to get fiber through food sources.  Always check the fiber content on thenutrition facts label of any prepackaged food. Look for foods that contain at least 5 grams of fiber per serving.  Ask your dietitian if you have questions about specific foods that are related to your condition, especially if those foods are not listed in the following section.  Increase your daily fiber consumption gradually. Increasing your intake of dietary fiber too quickly may cause bloating,  cramping, or gas.  Drink plenty of water. Water helps you to digest fiber. What foods can I eat? Grains Whole-grain breads. Multigrain cereal. Oats and oatmeal. Brown rice. Barley. Bulgur wheat. [place]. Bran muffins. Popcorn. Rye wafer crackers. Vegetables Sweet potatoes. Spinach. Kale. Artichokes. Cabbage. Broccoli. Green peas. Carrots. Squash. Fruits Berries. Pears. Apples. Oranges. Avocados. Prunes and raisins. Dried figs. Meats and Other Protein Sources [place], kidney, pinto, and soy beans. Split peas. Lentils. Nuts and seeds. Dairy Fiber-fortified yogurt. Beverages Fiber-fortified soy milk. Fiber-fortified orange juice. Other Fiber bars. The items listed above may not be a complete list of recommended foods or beverages. Contact your dietitian for more options. What foods are not recommended? Grains White bread. Pasta made with refined flour. White rice. Vegetables Fried potatoes. Canned vegetables. Well-cooked vegetables. Fruits Fruit juice. Cooked, strained fruit. Meats and Other Protein Sources Fatty cuts of meat. Fried Sales executive or fried fish. Dairy Milk. Yogurt. Cream cheese. Sour cream. Beverages Soft drinks. Other Cakes and pastries. Butter and oils. The items listed above may not be a complete list of foods and beverages to avoid. Contact your dietitian for more information. What are some tips for including high-fiber foods in my diet?  Eat a wide variety of high-fiber foods.  Make sure that half of all grains consumed each day are whole grains.  Replace breads and cereals made from refined flour or white flour with whole-grain breads and cereals.  Replace white rice with brown rice, bulgur wheat, or millet.  Start the day with a breakfast that is high in fiber, such as a cereal  that contains at least 5 grams of fiber per serving.  Use beans in place of meat in soups, salads, or pasta.  Eat high-fiber snacks, such as berries, raw vegetables, nuts, or  popcorn. This information is not intended to replace advice given to you by your health care provider. Make sure you discuss any questions you have with your health care provider. Document Released: 10/28/2005 Document Revised: 04/04/2016 Document Reviewed: 04/12/2014 Elsevier Interactive Patient Education  2017 [employer].  Colonoscopy, Adult, Care After This sheet gives you information about how to care for yourself after your procedure. Your health care provider may also give you more specific instructions. If you have problems or questions, contact your health care provider. What can I expect after the procedure? After the procedure, it is common to have:  A small amount of blood in your stool for 24 hours after the procedure.  Some gas.  Mild abdominal cramping or bloating.  Follow these instructions at home: General instructions   For the first 24 hours after the procedure: ? Do not drive or use machinery. ? Do not sign important documents. ? Do not drink alcohol. ? Do your regular daily activities at a slower pace than normal. ? Eat soft, easy-to-digest foods. ? Rest often.  Take over-the-counter or prescription medicines only as told by your health care provider.  It is up to you to get the results of your procedure. Ask your health care provider, or the department performing the procedure, when your results will be ready. Relieving cramping and bloating  Try walking around when you have cramps or feel bloated.  Apply heat to your abdomen as told by your health care provider. Use a heat source that your health care provider recommends, such as a moist heat pack or a heating pad. ? Place a towel between your skin and the heat source. ? Leave the heat on for 20-30 minutes. ? Remove the heat if your skin turns bright red. This is especially important if you are unable to feel pain, heat, or cold. You may have a greater risk of getting burned. Eating and  drinking  Drink enough fluid to keep your urine clear or pale yellow.  Resume your normal diet as instructed by your health care provider. Avoid heavy or fried foods that are hard to digest.  Avoid drinking alcohol for as long as instructed by your health care provider. Contact a health care provider if:  You have blood in your stool 2-3 days after the procedure. Get help right away if:  You have more than a small spotting of blood in your stool.  You pass large blood clots in your stool.  Your abdomen is swollen.  You have nausea or vomiting.  You have a fever.  You have increasing abdominal pain that is not relieved with medicine. This information is not intended to replace advice given to you by your health care provider. Make sure you discuss any questions you have with your health care provider. Document Released: 06/11/2004 Document Revised: 07/22/2016 Document Reviewed: 01/09/2016 Elsevier Interactive Patient Education  2018 [employer].  Colon Polyps Polyps are tissue growths inside the body. Polyps can grow in many places, including the large intestine (colon). A polyp may be a round bump or a mushroom-shaped growth. You could have one polyp or several. Most colon polyps are noncancerous (benign). However, some colon polyps can become cancerous over time. What are the causes? The exact cause of colon polyps is not known. What  increases the risk? This condition is more likely to develop in people who:  Have a family history of colon cancer or colon polyps.  Are older than 40 or older than 45 if they are African American.  Have inflammatory bowel disease, such as ulcerative colitis or Crohn disease.  Are overweight.  Smoke cigarettes.  Do not get enough exercise.  Drink too much alcohol.  Eat a diet that is: ? High in fat and red meat. ? Low in fiber.  Had childhood cancer that was treated with abdominal radiation.  What are the signs or symptoms? Most  polyps do not cause symptoms. If you have symptoms, they may include:  Blood coming from your rectum when having a bowel movement.  Blood in your stool.The stool may look dark red or black.  A change in bowel habits, such as constipation or diarrhea.  How is this diagnosed? This condition is diagnosed with a colonoscopy. This is a procedure that uses a lighted, flexible scope to look at the inside of your colon. How is this treated? Treatment for this condition involves removing any polyps that are found. Those polyps will then be tested for cancer. If cancer is found, your health care provider will talk to you about options for colon cancer treatment. Follow these instructions at home: Diet  Eat plenty of fiber, such as fruits, vegetables, and whole grains.  Eat foods that are high in calcium and vitamin D, such as milk, cheese, yogurt, eggs, liver, fish, and broccoli.  Limit foods high in fat, red meats, and processed meats, such as hot dogs, sausage, bacon, and lunch meats.  Maintain a healthy weight, or lose weight if recommended by your health care provider. General instructions  Do not smoke cigarettes.  Do not drink alcohol excessively.  Keep all follow-up visits as told by your health care provider. This is important. This includes keeping regularly scheduled colonoscopies. Talk to your health care provider about when you need a colonoscopy.  Exercise every day or as told by your health care provider. Contact a health care provider if:  You have new or worsening bleeding during a bowel movement.  You have new or increased blood in your stool.  You have a change in bowel habits.  You unexpectedly lose weight. This information is not intended to replace advice given to you by your health care provider. Make sure you discuss any questions you have with your health care provider. Document Released: 07/24/2004 Document Revised: 04/04/2016 Document Reviewed:  09/18/2015 Elsevier Interactive Patient Education  [employer].

## 2017-09-22 NOTE — H&P (Signed)
Elijah Shaffer is an 36 y.o. male.   Chief Complaint: Patient is here for colonoscopy. HPI: Patient is 36 year old Caucasian male who was hospitalized for sigmoid diverticulitis about 3 months ago.  He treated with antibiotics.  Feels better but he still has intermittent pain in left lower quadrant.  He is prone to constipation.  He has a strain in order to have a bowel movement.  He denies melena or rectal bleeding.  He takes ibuprofen no more than once or twice a week 200 to400 mg each time. History is positive for CRC in CRC in both maternal grandparents.  Past Medical History:  Diagnosis Date  . Diverticulitis   . Seasonal allergies     History reviewed. No pertinent surgical history.  Family History  Problem Relation Age of Onset  . Colon cancer Maternal Grandmother   . Colon cancer Maternal Grandfather    Social History:  reports that  has never smoked. he has never used smokeless tobacco. He reports that he drinks alcohol. He reports that he does not use drugs.  Allergies:  Allergies  Allergen Reactions  . Claritin [Loratadine] Other (See Comments)    Heart race    Medications Prior to Admission  Medication Sig Dispense Refill  . ibuprofen (ADVIL,MOTRIN) 200 MG tablet Take 400 mg every 8 (eight) hours as needed by mouth for mild pain or moderate pain.     [name] sucralfate (CARAFATE) 1 g tablet Take 1 tablet at bedtime by mouth.   0  . diphenhydrAMINE (BENADRYL) 25 MG tablet Take 25 mg daily as needed by mouth for itching or allergies.       No results found for this or any previous visit (from the past 48 hour(s)). No results found.  ROS  Blood pressure (!) 136/92, pulse 79, temperature 97.8 F (36.6 C), temperature source Oral, resp. rate 16, height 6\' 3"  (1.905 m), weight 240 lb (108.9 kg), SpO2 98 %. Physical Exam  Constitutional: He appears well-developed and well-nourished.  HENT:  Mouth/Throat: Oropharynx is clear and moist.  Eyes: Conjunctivae are normal. No  scleral icterus.  Neck: No thyromegaly present.  Cardiovascular: Normal rate, regular rhythm and normal heart sounds.  No murmur heard. Respiratory: Effort normal and breath sounds normal.  GI:  Abdomen is symmetrical and soft with mild tenderness at LLQ.  No organomegaly or masses.  Musculoskeletal: He exhibits no edema.  Lymphadenopathy:    He has no cervical adenopathy.  Neurological: He is alert.  Skin: Skin is warm and dry.     Assessment/Plan History of sigmoid diverticulitis. Diagnostic colonoscopy.  Hildred Laser, MD 09/22/2017, 10:03 AM

## 2017-09-22 NOTE — Op Note (Signed)
[facility] Patient Name: Elijah Shaffer Procedure Date: 09/22/2017 9:33 AM MRN: [id number] Date of Birth: 07-09-1981 Attending MD: Hildred Laser , MD CSN: [id number] Age: 36 Admit Type: Outpatient Procedure:                Colonoscopy Indications:              Follow-up of diverticulitis Providers:                Hildred Laser, MD, Charlsie Quest. Theda Sers RN, RN, Rosina Lowenstein, RN, Randa Spike, Technician Referring MD:             Arlyss Repress, MD Medicines:                Meperidine 100 mg IV, Midazolam 8 mg IV Complications:            No immediate complications. Estimated Blood Loss:     Estimated blood loss was minimal. Procedure:                Pre-Anesthesia Assessment:                           - Prior to the procedure, a History and Physical                            was performed, and patient medications and                            allergies were reviewed. The patient's tolerance of                            previous anesthesia was also reviewed. The risks                            and benefits of the procedure and the sedation                            options and risks were discussed with the patient.                            All questions were answered, and informed consent                            was obtained. Prior Anticoagulants: The patient                            last took ibuprofen 7 days prior to the procedure.                            ASA Grade Assessment: I - A normal, healthy                            patient. After reviewing the risks and benefits,  the patient was deemed in satisfactory condition to                            undergo the procedure.                           After obtaining informed consent, the colonoscope                            was passed under direct vision. Throughout the                            procedure, the patient's blood pressure, pulse, and                             oxygen saturations were monitored continuously. The                            EC-[id number] ([id number]) scope was introduced through                            the anus and advanced to the the cecum, identified                            by appendiceal orifice and ileocecal valve. The                            colonoscopy was performed without difficulty. The                            patient tolerated the procedure well. The quality                            of the bowel preparation was good. The ileocecal                            valve, appendiceal orifice, and rectum were                            photographed. Scope In: 10:17:07 AM Scope Out: 10:57:11 AM Scope Withdrawal Time: 0 hours 31 minutes 26 seconds  Total Procedure Duration: 0 hours 40 minutes 4 seconds  Findings:      The perianal and digital rectal examinations were normal.      Two sessile polyps were found in the transverse colon and ascending       colon. The polyps were 4 to 6 mm in size. These polyps were removed with       a cold snare. Resection and retrieval were complete. The pathology       specimen was placed into Bottle Number 1.      Two pedunculated polyps were found in the transverse colon. The polyps       were 7 to 8 mm in size. The pathology specimen was placed into Bottle       Number 2. To prevent bleeding after the polypectomy, two hemostatic  clips were successfully placed (MR conditional). There was no bleeding       at the end of the procedure.      Multiple medium-mouthed diverticula were found in the entire colon.      Internal hemorrhoids were found during retroflexion. The hemorrhoids       were small. Impression:               - Two 4 to 6 mm polyps in the transverse colon and                            in the ascending colon, removed with a cold snare.                            Resected and retrieved.                           - Two 7 to 8 mm polyps in the transverse colon.                             Clips (MR conditional) were placed.                           - Diverticulosis in the entire examined colon.                           - Internal hemorrhoids. Moderate Sedation:      Moderate (conscious) sedation was administered by the endoscopy nurse       and supervised by the endoscopist. The following parameters were       monitored: oxygen saturation, heart rate, blood pressure, CO2       capnography and response to care. Total physician intraservice time was       48 minutes. Recommendation:           - Patient has a contact number available for                            emergencies. The signs and symptoms of potential                            delayed complications were discussed with the                            patient. Return to normal activities tomorrow.                            Written discharge instructions were provided to the                            patient.                           - High fiber diet today.                           - Continue present medications.                           -  No aspirin, ibuprofen, naproxen, or other                            non-steroidal anti-inflammatory drugs for 7 days                            after polyp removal.                           - Await pathology results.                           - Repeat colonoscopy in 3 years for surveillance.                           - Pantoprazole 40 mg po qam (for GERD).                           - Dicyclomine 10 mg po bid prn.                           - Return to GI clinic in 3 months. Procedure Code(s):        --- Professional ---                           [phone], Colonoscopy, flexible; with removal of                            tumor(s), polyp(s), or other lesion(s) by snare                            technique                           99152, Moderate sedation services provided by the                            same physician or other qualified health  care                            professional performing the diagnostic or                            therapeutic service that the sedation supports,                            requiring the presence of an independent trained                            observer to assist in the monitoring of the                            patient's level of consciousness and physiological  status; initial 15 minutes of intraservice time,                            patient age 66 years or older                           [phone], Moderate sedation services; each additional                            15 minutes intraservice time                           [phone], Moderate sedation services; each additional                            15 minutes intraservice time Diagnosis Code(s):        --- Professional ---                           D12.2, Benign neoplasm of ascending colon                           D12.3, Benign neoplasm of transverse colon (hepatic                            flexure or splenic flexure)                           K64.8, Other hemorrhoids                           K57.32, Diverticulitis of large intestine without                            perforation or abscess without bleeding                           K57.30, Diverticulosis of large intestine without                            perforation or abscess without bleeding CPT copyright 2016 American Medical Association. All rights reserved. The codes documented in this report are preliminary and upon coder review may  be revised to meet current compliance requirements. Hildred Laser, MD Hildred Laser, MD 09/22/2017 11:20:14 AM This report has been signed electronically. Number of Addenda: 0

## 2017-09-25 ENCOUNTER — Encounter (HOSPITAL_COMMUNITY): Payer: Self-pay | Admitting: Internal Medicine

## 2017-10-08 ENCOUNTER — Encounter (INDEPENDENT_AMBULATORY_CARE_PROVIDER_SITE_OTHER): Payer: Self-pay | Admitting: Internal Medicine

## 2017-10-08 NOTE — Progress Notes (Signed)
Patient was given an appointment for 01/08/18 at 1:45pm with Deberah Castle, NP.  A letter was mailed to the patient.

## 2017-10-14 DIAGNOSIS — K219 Gastro-esophageal reflux disease without esophagitis: Secondary | ICD-10-CM | POA: Diagnosis not present

## 2017-10-14 DIAGNOSIS — Z23 Encounter for immunization: Secondary | ICD-10-CM | POA: Diagnosis not present

## 2017-10-14 DIAGNOSIS — Z0001 Encounter for general adult medical examination with abnormal findings: Secondary | ICD-10-CM | POA: Diagnosis not present

## 2017-10-14 DIAGNOSIS — K5792 Diverticulitis of intestine, part unspecified, without perforation or abscess without bleeding: Secondary | ICD-10-CM | POA: Diagnosis not present

## 2017-10-14 DIAGNOSIS — Z6830 Body mass index (BMI) 30.0-30.9, adult: Secondary | ICD-10-CM | POA: Diagnosis not present

## 2017-12-03 DIAGNOSIS — M79671 Pain in right foot: Secondary | ICD-10-CM | POA: Diagnosis not present

## 2017-12-03 DIAGNOSIS — M76821 Posterior tibial tendinitis, right leg: Secondary | ICD-10-CM | POA: Diagnosis not present

## 2017-12-03 DIAGNOSIS — M76822 Posterior tibial tendinitis, left leg: Secondary | ICD-10-CM | POA: Diagnosis not present

## 2017-12-03 DIAGNOSIS — M79672 Pain in left foot: Secondary | ICD-10-CM | POA: Diagnosis not present

## 2017-12-24 DIAGNOSIS — M79671 Pain in right foot: Secondary | ICD-10-CM | POA: Diagnosis not present

## 2017-12-24 DIAGNOSIS — M79672 Pain in left foot: Secondary | ICD-10-CM | POA: Diagnosis not present

## 2017-12-24 DIAGNOSIS — M76822 Posterior tibial tendinitis, left leg: Secondary | ICD-10-CM | POA: Diagnosis not present

## 2017-12-24 DIAGNOSIS — M76821 Posterior tibial tendinitis, right leg: Secondary | ICD-10-CM | POA: Diagnosis not present

## 2018-01-08 ENCOUNTER — Other Ambulatory Visit (INDEPENDENT_AMBULATORY_CARE_PROVIDER_SITE_OTHER): Payer: Self-pay | Admitting: *Deleted

## 2018-01-08 ENCOUNTER — Ambulatory Visit (INDEPENDENT_AMBULATORY_CARE_PROVIDER_SITE_OTHER): Payer: BLUE CROSS/BLUE SHIELD | Admitting: Internal Medicine

## 2018-01-08 ENCOUNTER — Encounter (INDEPENDENT_AMBULATORY_CARE_PROVIDER_SITE_OTHER): Payer: Self-pay | Admitting: Internal Medicine

## 2018-01-08 VITALS — BP 134/90 | HR 76 | Temp 98.5°F | Ht 75.0 in | Wt 245.0 lb

## 2018-01-08 DIAGNOSIS — K219 Gastro-esophageal reflux disease without esophagitis: Secondary | ICD-10-CM

## 2018-01-08 DIAGNOSIS — K5732 Diverticulitis of large intestine without perforation or abscess without bleeding: Secondary | ICD-10-CM | POA: Diagnosis not present

## 2018-01-08 HISTORY — DX: Gastro-esophageal reflux disease without esophagitis: K21.9

## 2018-01-08 NOTE — Patient Instructions (Addendum)
H. Pylori today.  OV in 6 months. No NSAIDs Gastroesophageal Reflux Disease, Adult Normally, food travels down the esophagus and stays in the stomach to be digested. If a person has gastroesophageal reflux disease (GERD), food and stomach acid move back up into the esophagus. When this happens, the esophagus becomes sore and swollen (inflamed). Over time, GERD can make small holes (ulcers) in the lining of the esophagus. Follow these instructions at home: Diet  Follow a diet as told by your doctor. You may need to avoid foods and drinks such as: ? Coffee and tea (with or without caffeine). ? Drinks that contain alcohol. ? Energy drinks and sports drinks. ? Carbonated drinks or sodas. ? Chocolate and cocoa. ? Peppermint and mint flavorings. ? Garlic and onions. ? Horseradish. ? Spicy and acidic foods, such as peppers, chili powder, curry powder, vinegar, hot sauces, and BBQ sauce. ? Citrus fruit juices and citrus fruits, such as oranges, lemons, and limes. ? Tomato-based foods, such as red sauce, chili, salsa, and pizza with red sauce. ? Fried and fatty foods, such as donuts, french fries, potato chips, and high-fat dressings. ? High-fat meats, such as hot dogs, rib eye steak, sausage, ham, and bacon. ? High-fat dairy items, such as whole milk, butter, and cream cheese.  Eat small meals often. Avoid eating large meals.  Avoid drinking large amounts of liquid with your meals.  Avoid eating meals during the 2-3 hours before bedtime.  Avoid lying down right after you eat.  Do not exercise right after you eat. General instructions  Pay attention to any changes in your symptoms.  Take over-the-counter and prescription medicines only as told by your doctor. Do not take aspirin, ibuprofen, or other NSAIDs unless your doctor says it is okay.  Do not use any tobacco products, including cigarettes, chewing tobacco, and e-cigarettes. If you need help quitting, ask your doctor.  Wear  loose clothes. Do not wear anything tight around your waist.  Raise (elevate) the head of your bed about 6 inches (15 cm).  Try to lower your stress. If you need help doing this, ask your doctor.  If you are overweight, lose an amount of weight that is healthy for you. Ask your doctor about a safe weight loss goal.  Keep all follow-up visits as told by your doctor. This is important. Contact a doctor if:  You have new symptoms.  You lose weight and you do not know why it is happening.  You have trouble swallowing, or it hurts to swallow.  You have wheezing or a cough that keeps happening.  Your symptoms do not get better with treatment.  You have a hoarse voice. Get help right away if:  You have pain in your arms, neck, jaw, teeth, or back.  You feel sweaty, dizzy, or light-headed.  You have chest pain or shortness of breath.  You throw up (vomit) and your throw up looks like blood or coffee grounds.  You pass out (faint).  Your poop (stool) is bloody or black.  You cannot swallow, drink, or eat. This information is not intended to replace advice given to you by your health care provider. Make sure you discuss any questions you have with your health care provider. Document Released: 04/15/2008 Document Revised: 04/04/2016 Document Reviewed: 02/22/2015 Elsevier Interactive Patient Education  [employer].

## 2018-01-08 NOTE — Progress Notes (Signed)
Subjective:    Patient ID: Elijah Shaffer, male    DOB: 1980/12/07, 37 y.o.   MRN: [id number]  HPI Here today for f/u. Last seen in August for diverticulitis. Admitted to [facility] in August of 2018 with same.  Underwent a CT scan in August for abdominal pain/vomiting. CT revealed  IMPRESSION: 1. Focal sigmoid diverticulitis in mid sigmoid colon with wall thickening and mesenteric thickening in this area. There are foci of microperforation in this area. No abscess. He improved and was discharged home.  Family hx of both grandmother who had colon cancer in their 32s.  He underwent a colonoscopy in November for follow up of diverticulitis.  Impression:               - Two 4 to 6 mm polyps in the transverse colon and                            in the ascending colon, removed with a cold snare.                            Resected and retrieved.                           - Two 7 to 8 mm polyps in the transverse colon.                            Clips (MR conditional) were placed.                           - Diverticulosis in the entire examined colon.                           - Internal hemorrhoids. Biopsy revealed: tubular adenoma. Next colonoscopy in 3 yrs.   He was taking Motrin on occasion.  He was switched to Tylenol EC. He was started on Protonix by Dr. Laural Golden after his colonoscopy. He tells me he is doing good. Sometimes he has spells when he  Has epigastric pain. He is taking the Protonix daily. Occasionally take Dicyclomine as needed. He states he is taking pretty much daily.  His acid reflux controlled with Protonix. He does have some belching but not as often as he did. He has on occasion had some rectal bleeding.  His appetite is good. He has gained 6 pounds since November.  He has a BM daily and if he misses a day, he will take a stool softener.   Review of Systems Past Medical History:  Diagnosis Date  . Diverticulitis   . GERD (gastroesophageal reflux disease) 01/08/2018  .  Seasonal allergies     Past Surgical History:  Procedure Laterality Date  . COLONOSCOPY N/A 09/22/2017   Procedure: COLONOSCOPY;  Surgeon: Rogene Houston, MD;  Location: [facility];  Service: Endoscopy;  Laterality: N/A;  9:55  . POLYPECTOMY  09/22/2017   Procedure: POLYPECTOMY;  Surgeon: Rogene Houston, MD;  Location: [facility];  Service: Endoscopy;;    Allergies  Allergen Reactions  . Claritin [Loratadine] Other (See Comments)    Heart race    Current Outpatient Medications on File Prior to Visit  Medication Sig Dispense Refill  . acetaminophen (TYLENOL) 500 MG tablet Take  500 mg by mouth every 6 (six) hours as needed.    . dicyclomine (BENTYL) 10 MG capsule Take 1 capsule (10 mg total) 2 (two) times daily as needed by mouth. 60 capsule 5  . diphenhydrAMINE (BENADRYL) 25 MG tablet Take 25 mg daily as needed by mouth for itching or allergies.     [name] docusate sodium (COLACE) 100 MG capsule Take 100 mg by mouth as needed for mild constipation.    . pantoprazole (PROTONIX) 40 MG tablet Take 1 tablet (40 mg total) daily before breakfast by mouth. 30 tablet 5   No current facility-administered medications on file prior to visit.         Objective:   Physical Exam Blood pressure 134/90, pulse 76, temperature 98.5 F (36.9 C), height 6\' 3"  (1.905 m), weight 245 lb (111.1 kg). Alert and oriented. Skin warm and dry. Oral mucosa is moist.   . Sclera anicteric, conjunctivae is pink. Thyroid not enlarged. No cervical lymphadenopathy. Lungs clear. Heart regular rate and rhythm.  Abdomen is soft. Bowel sounds are positive. No hepatomegaly. No abdominal masses felt. No tenderness.  No edema to lower extremities.           Assessment & Plan:  Diverticulitis: Has not had an episode since November. GERD: He will continue the Protonix and Dicyclomine H. Pylori.  Gerd diet given to patient. OV in 6 months.

## 2018-01-09 DIAGNOSIS — K219 Gastro-esophageal reflux disease without esophagitis: Secondary | ICD-10-CM | POA: Diagnosis not present

## 2018-01-12 LAB — HELICOBACTER PYLORI  SPECIAL ANTIGEN
MICRO NUMBER:: 90269755
RESULT: DETECTED — AB
SPECIMEN QUALITY: ADEQUATE

## 2018-01-14 ENCOUNTER — Telehealth (INDEPENDENT_AMBULATORY_CARE_PROVIDER_SITE_OTHER): Payer: Self-pay | Admitting: Internal Medicine

## 2018-01-14 DIAGNOSIS — A048 Other specified bacterial intestinal infections: Secondary | ICD-10-CM

## 2018-01-14 MED ORDER — BIS SUBCIT-METRONID-TETRACYC 140-125-125 MG PO CAPS: 3.0000 | ORAL_CAPSULE | Freq: Three times a day (TID) | ORAL | 0 refills | Status: DC

## 2018-01-14 MED ORDER — OMEPRAZOLE 20 MG PO CPDR: 20.0000 mg | DELAYED_RELEASE_CAPSULE | Freq: Two times a day (BID) | ORAL | 0 refills | Status: DC

## 2018-01-14 NOTE — Telephone Encounter (Signed)
Rx sent to his pharmacy

## 2018-01-15 ENCOUNTER — Telehealth (INDEPENDENT_AMBULATORY_CARE_PROVIDER_SITE_OTHER): Payer: Self-pay | Admitting: Internal Medicine

## 2018-01-15 ENCOUNTER — Telehealth (INDEPENDENT_AMBULATORY_CARE_PROVIDER_SITE_OTHER): Payer: Self-pay | Admitting: *Deleted

## 2018-01-15 DIAGNOSIS — L723 Sebaceous cyst: Secondary | ICD-10-CM | POA: Diagnosis not present

## 2018-01-15 DIAGNOSIS — A048 Other specified bacterial intestinal infections: Secondary | ICD-10-CM

## 2018-01-15 MED ORDER — AMOXICILLIN 500 MG PO TABS: 500.0000 mg | ORAL_TABLET | Freq: Two times a day (BID) | ORAL | 0 refills | Status: DC

## 2018-01-15 MED ORDER — CLARITHROMYCIN 500 MG PO TABS: 500.0000 mg | ORAL_TABLET | Freq: Two times a day (BID) | ORAL | 0 refills | Status: DC

## 2018-01-15 NOTE — Telephone Encounter (Signed)
Rx for Biaxin and Amoxicillin sent to his pharmacy

## 2018-01-15 NOTE — Telephone Encounter (Signed)
I have spoke with mother

## 2018-01-15 NOTE — Telephone Encounter (Signed)
Rx for Biaxin and Amoxicillin sent to [facility]

## 2018-01-15 NOTE — Telephone Encounter (Signed)
Patient called stating the insurance will not cover other medication it will be over 1000$ [phone]

## 2018-01-22 DIAGNOSIS — D485 Neoplasm of uncertain behavior of skin: Secondary | ICD-10-CM | POA: Diagnosis not present

## 2018-03-16 DIAGNOSIS — J309 Allergic rhinitis, unspecified: Secondary | ICD-10-CM | POA: Diagnosis not present

## 2018-03-16 DIAGNOSIS — T63791A Toxic effect of contact with other venomous plant, accidental (unintentional), initial encounter: Secondary | ICD-10-CM | POA: Diagnosis not present

## 2018-07-08 ENCOUNTER — Ambulatory Visit (INDEPENDENT_AMBULATORY_CARE_PROVIDER_SITE_OTHER): Payer: BLUE CROSS/BLUE SHIELD | Admitting: Internal Medicine

## 2018-07-08 ENCOUNTER — Encounter (INDEPENDENT_AMBULATORY_CARE_PROVIDER_SITE_OTHER): Payer: Self-pay | Admitting: Internal Medicine

## 2018-07-08 VITALS — BP 152/90 | HR 80 | Temp 98.0°F | Ht 74.0 in | Wt 238.2 lb

## 2018-07-08 DIAGNOSIS — K5732 Diverticulitis of large intestine without perforation or abscess without bleeding: Secondary | ICD-10-CM

## 2018-07-08 DIAGNOSIS — Z800 Family history of malignant neoplasm of digestive organs: Secondary | ICD-10-CM

## 2018-07-08 DIAGNOSIS — K219 Gastro-esophageal reflux disease without esophagitis: Secondary | ICD-10-CM | POA: Diagnosis not present

## 2018-07-08 MED ORDER — PANTOPRAZOLE SODIUM 40 MG PO TBEC: 40.0000 mg | DELAYED_RELEASE_TABLET | Freq: Every day | ORAL | 11 refills | Status: DC

## 2018-07-08 NOTE — Progress Notes (Signed)
Subjective:  Weight in February of 2018 245. Today his weight is 238.2.  Patient ID: Elijah Shaffer, male    DOB: February 03, 1981, 37 y.o.   MRN: [id number]  HPI Here today for f/u. Last seen in February of of this year.  Hx of diverticulitis.  In  August of 2018 underwent a CT which revealed for abdominal pain/vomioting. CT revealed: IMPRESSION: 1. Focal sigmoid diverticulitis in mid sigmoid colon with wall thickening and mesenteric thickening in this area. There are foci of microperforation in this area. No abscess. He improved and was discharged home. (Admitted to [facility]).  Family hx of both grandmother who had colon cancer in their 34s.  He underwent a colonoscopy in November of 2018 for follow up of diverticulitis.  Impression: - Two 4 to 6 mm polyps in the transverse colon and  in the ascending colon, removed with a cold snare.  Resected and retrieved. - Two 7 to 8 mm polyps in the transverse colon.  Clips (MR conditional) were placed. - Diverticulosis in the entire examined colon. - Internal hemorrhoids. Biopsy revealed: tubular adenoma. Next colonoscopy in 3 yrs.  He tells me he is doing good. He watches what he eat. He tries to avoid seeds. His appetite is good. He has lost from 245 to 238. He does have some GERD symptoms. Presently, he is not on a PPI for his GERD at present.  BMs are normal. No melena or BRRB. He is not having any abdominal pain.  No further bouts of diverticulitis.  Treated for H. Pylori in March of this year.  He still has some belching.  Review of Systems     Past Medical History:  Diagnosis Date  . Diverticulitis   . GERD (gastroesophageal reflux disease) 01/08/2018  . Seasonal allergies     Past Surgical History:  Procedure Laterality Date  . COLONOSCOPY N/A 09/22/2017   Procedure:  COLONOSCOPY;  Surgeon: Rogene Houston, MD;  Location: [facility];  Service: Endoscopy;  Laterality: N/A;  9:55  . POLYPECTOMY  09/22/2017   Procedure: POLYPECTOMY;  Surgeon: Rogene Houston, MD;  Location: [facility];  Service: Endoscopy;;    Allergies  Allergen Reactions  . Claritin [Loratadine] Other (See Comments)    Heart race    Current Outpatient Medications on File Prior to Visit  Medication Sig Dispense Refill  . acetaminophen (TYLENOL) 500 MG tablet Take 500 mg by mouth every 6 (six) hours as needed.    . diphenhydrAMINE (BENADRYL) 25 MG tablet Take 25 mg daily as needed by mouth for itching or allergies.     [name] docusate sodium (COLACE) 100 MG capsule Take 100 mg by mouth as needed for mild constipation.     No current facility-administered medications on file prior to visit.      Objective:   Physical Exam Blood pressure (!) 152/90, pulse 80, temperature 98 F (36.7 C), height 6\' 2"  (1.88 m), weight 238 lb 3.2 oz (108 kg). Alert and oriented. Skin warm and dry. Oral mucosa is moist.   . Sclera anicteric, conjunctivae is pink. Thyroid not enlarged. No cervical lymphadenopathy. Lungs clear. Heart regular rate and rhythm.  Abdomen is soft. Bowel sounds are positive. No hepatomegaly. No abdominal masses felt. No tenderness.  No edema to lower extremities.          Assessment & Plan:  GERD. Rx sent to his pharmacy. Hx of diverticulitis. No further bouts. He is doing well.  Family hx of colon cancer.  Next colonoscopy in 2021.

## 2018-07-08 NOTE — Patient Instructions (Addendum)
OV in 1 year.  

## 2018-07-24 ENCOUNTER — Other Ambulatory Visit: Payer: Self-pay

## 2018-07-24 ENCOUNTER — Emergency Department (HOSPITAL_COMMUNITY): Payer: BLUE CROSS/BLUE SHIELD

## 2018-07-24 ENCOUNTER — Inpatient Hospital Stay (HOSPITAL_COMMUNITY)
Admission: EM | Admit: 2018-07-24 | Discharge: 2018-07-26 | DRG: 392 | Disposition: A | Discharge status: Home / Self Care | Payer: BLUE CROSS/BLUE SHIELD | Attending: Family Medicine | Admitting: Family Medicine

## 2018-07-24 ENCOUNTER — Encounter (HOSPITAL_COMMUNITY): Payer: Self-pay

## 2018-07-24 DIAGNOSIS — K219 Gastro-esophageal reflux disease without esophagitis: Secondary | ICD-10-CM | POA: Diagnosis not present

## 2018-07-24 DIAGNOSIS — K5792 Diverticulitis of intestine, part unspecified, without perforation or abscess without bleeding: Secondary | ICD-10-CM | POA: Diagnosis not present

## 2018-07-24 DIAGNOSIS — K5732 Diverticulitis of large intestine without perforation or abscess without bleeding: Principal | ICD-10-CM | POA: Diagnosis present

## 2018-07-24 DIAGNOSIS — Z888 Allergy status to other drugs, medicaments and biological substances status: Secondary | ICD-10-CM | POA: Diagnosis not present

## 2018-07-24 DIAGNOSIS — Z79899 Other long term (current) drug therapy: Secondary | ICD-10-CM | POA: Diagnosis not present

## 2018-07-24 DIAGNOSIS — N182 Chronic kidney disease, stage 2 (mild): Secondary | ICD-10-CM | POA: Diagnosis not present

## 2018-07-24 DIAGNOSIS — N179 Acute kidney failure, unspecified: Secondary | ICD-10-CM | POA: Diagnosis present

## 2018-07-24 DIAGNOSIS — K760 Fatty (change of) liver, not elsewhere classified: Secondary | ICD-10-CM | POA: Diagnosis not present

## 2018-07-24 DIAGNOSIS — Z86010 Personal history of colonic polyps: Secondary | ICD-10-CM

## 2018-07-24 DIAGNOSIS — K449 Diaphragmatic hernia without obstruction or gangrene: Secondary | ICD-10-CM | POA: Diagnosis not present

## 2018-07-24 LAB — COMPREHENSIVE METABOLIC PANEL
ALK PHOS: 62 U/L (ref 38–126)
ALT: 22 U/L (ref 0–44)
AST: 19 U/L (ref 15–41)
Albumin: 4.5 g/dL (ref 3.5–5.0)
Anion gap: 6 (ref 5–15)
BUN: 13 mg/dL (ref 6–20)
CHLORIDE: 104 mmol/L (ref 98–111)
CO2: 29 mmol/L (ref 22–32)
Calcium: 9.2 mg/dL (ref 8.9–10.3)
Creatinine, Ser: 1.44 mg/dL — ABNORMAL HIGH (ref 0.61–1.24)
GFR calc Af Amer: >60 mL/min (ref >60)
Glucose, Bld: 116 mg/dL — ABNORMAL HIGH (ref 70–99)
POTASSIUM: 4 mmol/L (ref 3.5–5.1)
SODIUM: 139 mmol/L (ref 135–145)
Total Bilirubin: 2.1 mg/dL — ABNORMAL HIGH (ref 0.3–1.2)
Total Protein: 8 g/dL (ref 6.5–8.1)

## 2018-07-24 LAB — URINALYSIS, ROUTINE W REFLEX MICROSCOPIC
BACTERIA UA: NONE SEEN
Bilirubin Urine: NEGATIVE
GLUCOSE, UA: NEGATIVE mg/dL
Hgb urine dipstick: NEGATIVE
KETONES UR: NEGATIVE mg/dL
LEUKOCYTES UA: NEGATIVE
Nitrite: NEGATIVE
PROTEIN: 30 mg/dL — AB
Specific Gravity, Urine: 1.026 (ref 1.005–1.030)
pH: 6 (ref 5.0–8.0)

## 2018-07-24 LAB — CBC
HEMATOCRIT: 47.9 % (ref 39.0–52.0)
HEMOGLOBIN: 16.3 g/dL (ref 13.0–17.0)
MCH: 30.8 pg (ref 26.0–34.0)
MCHC: 34 g/dL (ref 30.0–36.0)
MCV: 90.4 fL (ref 78.0–100.0)
Platelets: 198 10*3/uL (ref 150–400)
RBC: 5.3 MIL/uL (ref 4.22–5.81)
RDW: 12.7 % (ref 11.5–15.5)
WBC: 19.2 10*3/uL — AB (ref 4.0–10.5)

## 2018-07-24 LAB — LIPASE, BLOOD: LIPASE: 28 U/L (ref 11–51)

## 2018-07-24 MED ORDER — ONDANSETRON HCL 4 MG/2ML IJ SOLN
4.0000 mg | Freq: Once | INTRAMUSCULAR | Status: AC
  Administered 2018-07-24: 4 mg via INTRAVENOUS
  Filled 2018-07-24: qty 2

## 2018-07-24 MED ORDER — METRONIDAZOLE IN NACL 5-0.79 MG/ML-% IV SOLN
500.0000 mg | Freq: Three times a day (TID) | INTRAVENOUS | Status: DC
  Administered 2018-07-24 – 2018-07-26 (×6): 500 mg via INTRAVENOUS
  Filled 2018-07-24 (×6): qty 100

## 2018-07-24 MED ORDER — MORPHINE SULFATE (PF) 4 MG/ML IV SOLN
4.0000 mg | Freq: Once | INTRAVENOUS | Status: AC
  Administered 2018-07-24: 4 mg via INTRAVENOUS
  Filled 2018-07-24: qty 1

## 2018-07-24 MED ORDER — CIPROFLOXACIN IN D5W 400 MG/200ML IV SOLN
400.0000 mg | Freq: Once | INTRAVENOUS | Status: AC
  Administered 2018-07-24: 400 mg via INTRAVENOUS
  Filled 2018-07-24: qty 200

## 2018-07-24 MED ORDER — LACTATED RINGERS IV SOLN
INTRAVENOUS | Status: DC
  Administered 2018-07-24 – 2018-07-25 (×3): via INTRAVENOUS

## 2018-07-24 MED ORDER — CIPROFLOXACIN IN D5W 400 MG/200ML IV SOLN
400.0000 mg | Freq: Two times a day (BID) | INTRAVENOUS | Status: DC
  Administered 2018-07-24 – 2018-07-26 (×4): 400 mg via INTRAVENOUS
  Filled 2018-07-24 (×4): qty 200

## 2018-07-24 MED ORDER — ONDANSETRON HCL 4 MG/2ML IJ SOLN
4.0000 mg | Freq: Four times a day (QID) | INTRAMUSCULAR | Status: DC | PRN
  Administered 2018-07-24 (×2): 4 mg via INTRAVENOUS
  Filled 2018-07-24 (×2): qty 2

## 2018-07-24 MED ORDER — HEPARIN SODIUM (PORCINE) 5000 UNIT/ML IJ SOLN
5000.0000 [IU] | Freq: Three times a day (TID) | INTRAMUSCULAR | Status: DC
  Administered 2018-07-24 – 2018-07-26 (×7): 5000 [IU] via SUBCUTANEOUS
  Filled 2018-07-24 (×7): qty 1

## 2018-07-24 MED ORDER — SODIUM CHLORIDE 0.9 % IV BOLUS
1000.0000 mL | Freq: Once | INTRAVENOUS | Status: AC
  Administered 2018-07-24: 1000 mL via INTRAVENOUS

## 2018-07-24 MED ORDER — HYDROMORPHONE HCL 1 MG/ML IJ SOLN
0.5000 mg | INTRAMUSCULAR | Status: DC | PRN
  Administered 2018-07-24 – 2018-07-25 (×4): 0.5 mg via INTRAVENOUS
  Filled 2018-07-24: qty 0.5
  Filled 2018-07-24: qty 1
  Filled 2018-07-24 (×2): qty 0.5

## 2018-07-24 MED ORDER — ACETAMINOPHEN 325 MG PO TABS
650.0000 mg | ORAL_TABLET | Freq: Four times a day (QID) | ORAL | Status: DC | PRN
  Administered 2018-07-24 – 2018-07-25 (×4): 650 mg via ORAL
  Filled 2018-07-24 (×5): qty 2

## 2018-07-24 MED ORDER — METRONIDAZOLE IN NACL 5-0.79 MG/ML-% IV SOLN
500.0000 mg | Freq: Once | INTRAVENOUS | Status: AC
  Administered 2018-07-24: 500 mg via INTRAVENOUS
  Filled 2018-07-24: qty 100

## 2018-07-24 MED ORDER — PANTOPRAZOLE SODIUM 40 MG PO TBEC
40.0000 mg | DELAYED_RELEASE_TABLET | Freq: Every day | ORAL | Status: DC
  Administered 2018-07-24 – 2018-07-26 (×3): 40 mg via ORAL
  Filled 2018-07-24 (×4): qty 1

## 2018-07-24 MED ORDER — DOCUSATE SODIUM 100 MG PO CAPS
100.0000 mg | ORAL_CAPSULE | ORAL | Status: DC | PRN
  Administered 2018-07-24: 100 mg via ORAL
  Filled 2018-07-24: qty 1

## 2018-07-24 MED ORDER — IOPAMIDOL (ISOVUE-300) INJECTION 61%
100.0000 mL | Freq: Once | INTRAVENOUS | Status: AC | PRN
  Administered 2018-07-24: 100 mL via INTRAVENOUS

## 2018-07-24 MED ORDER — ONDANSETRON HCL 4 MG PO TABS: 4.0000 mg | ORAL_TABLET | Freq: Four times a day (QID) | ORAL | Status: DC | PRN

## 2018-07-24 NOTE — ED Provider Notes (Signed)
[facility] EMERGENCY DEPARTMENT Provider Note   CSN: [id number] Arrival date & time: 07/24/18  0106     History   Chief Complaint Chief Complaint  Patient presents with  . Abdominal Pain    HPI Elijah Shaffer is a 37 y.o. male.  Patient is a 37 year old male with past medical history of diverticulitis.  He presents today for evaluation of lower abdominal pain.  This started 2 days ago and is rapidly worsening.  He reports fever at home to 100.8.  He denies any bloody stool or diarrhea.  He denies any nausea or vomiting.  This feels similar to when he had diverticulitis in the past.  He was previously admitted about one year ago and has been doing well since until now.  The history is provided by the patient.  Abdominal Pain   This is a recurrent problem. The current episode started 2 days ago. The problem occurs constantly. The problem has been rapidly worsening. The pain is associated with an unknown factor. The pain is located in the RLQ, LLQ and suprapubic region. The pain is moderate. Associated symptoms include fever. Pertinent negatives include vomiting, constipation, dysuria, frequency and hematuria. The symptoms are aggravated by palpation and certain positions. Nothing relieves the symptoms.    Past Medical History:  Diagnosis Date  . Diverticulitis   . GERD (gastroesophageal reflux disease) 01/08/2018  . Seasonal allergies     Patient Active Problem List   Diagnosis Date Noted  . GERD (gastroesophageal reflux disease) 01/08/2018  . Diverticulitis of colon 09/15/2017  . Sigmoid diverticulitis   . Diverticulitis 06/28/2017  . Acute renal injury ([place]) 06/28/2017    Past Surgical History:  Procedure Laterality Date  . COLONOSCOPY N/A 09/22/2017   Procedure: COLONOSCOPY;  Surgeon: Rogene Houston, MD;  Location: [facility];  Service: Endoscopy;  Laterality: N/A;  9:55  . POLYPECTOMY  09/22/2017   Procedure: POLYPECTOMY;  Surgeon: Rogene Houston, MD;   Location: [facility];  Service: Endoscopy;;        Home Medications    Prior to Admission medications   Medication Sig Start Date End Date Taking? Authorizing Provider  acetaminophen (TYLENOL) 500 MG tablet Take 500 mg by mouth every 6 (six) hours as needed.    [provider]  diphenhydrAMINE (BENADRYL) 25 MG tablet Take 25 mg daily as needed by mouth for itching or allergies.     [provider]  docusate sodium (COLACE) 100 MG capsule Take 100 mg by mouth as needed for mild constipation.    [provider]  pantoprazole (PROTONIX) 40 MG tablet Take 1 tablet (40 mg total) by mouth daily before breakfast. 07/08/18   Setzer, Rona Ravens, NP    Family History Family History  Problem Relation Age of Onset  . Colon cancer Maternal Grandmother   . Colon cancer Maternal Grandfather     Social History Social History   Tobacco Use  . Smoking status: Never Smoker  . Smokeless tobacco: Never Used  Substance Use Topics  . Alcohol use: Yes    Comment: occasional  . Drug use: No     Allergies   Claritin [loratadine]   Review of Systems Review of Systems  Constitutional: Positive for fever.  Gastrointestinal: Positive for abdominal pain. Negative for constipation and vomiting.  Genitourinary: Negative for dysuria, frequency and hematuria.  All other systems reviewed and are negative.    Physical Exam Updated Vital Signs BP 117/80 (BP Location: Right Arm)  Pulse (!) 103   Temp (!) 100.7 F (38.2 C) (Oral)   Resp 15   Ht 6\' 3"  (1.905 m)   Wt 99.8 kg   SpO2 98%   BMI 27.50 kg/m   Physical Exam  Constitutional: He is oriented to person, place, and time. He appears well-developed and well-nourished. No distress.  HENT:  Head: Normocephalic and atraumatic.  Mouth/Throat: Oropharynx is clear and moist.  Neck: Normal range of motion. Neck supple.  Cardiovascular: Normal rate and regular rhythm. Exam reveals no friction rub.  No murmur  heard. Pulmonary/Chest: Effort normal and breath sounds normal. No respiratory distress. He has no wheezes. He has no rales.  Abdominal: Soft. Bowel sounds are normal. He exhibits no distension. There is tenderness in the right lower quadrant, suprapubic area and left lower quadrant. There is no rigidity, no rebound and no guarding.  Musculoskeletal: Normal range of motion. He exhibits no edema.  Neurological: He is alert and oriented to person, place, and time. Coordination normal.  Skin: Skin is warm and dry. He is not diaphoretic.  Nursing note and vitals reviewed.    ED Treatments / Results  Labs (all labs ordered are listed, but only abnormal results are displayed) Labs Reviewed  LIPASE, BLOOD  COMPREHENSIVE METABOLIC PANEL  CBC  URINALYSIS, ROUTINE W REFLEX MICROSCOPIC    EKG None  Radiology No results found.  Procedures Procedures (including critical care time)  Medications Ordered in ED Medications  sodium chloride 0.9 % bolus 1,000 mL (has no administration in time range)  ondansetron (ZOFRAN) injection 4 mg (has no administration in time range)  morphine 4 MG/ML injection 4 mg (has no administration in time range)     Initial Impression / Assessment and Plan / ED Course  I have reviewed the triage vital signs and the nursing notes.  Pertinent labs & imaging results that were available during my care of the patient were reviewed by me and considered in my medical decision making (see chart for details).  Patient with white count of 20,000, fever of 101, and CT that shows acute diverticulitis.  I feels that he should be admitted for IV antibiotics.  He was given IV Cipro and Flagyl here in the department and will be admitted to the hospitalist service under the care of Dr. Manuella Ghazi.  Final Clinical Impressions(s) / ED Diagnoses   Final diagnoses:  None    ED Discharge Orders    None       Veryl Speak, MD 07/24/18 [id number]

## 2018-07-24 NOTE — H&P (Signed)
History and Physical    Elijah Shaffer XTG:[id number] DOB: Dec 11, 1980 DOA: 07/24/2018  PCP: Jani Gravel, MD   Patient coming from: Home  Chief Complaint: Lower abdominal pain  HPI: Elijah Shaffer is a 37 y.o. male with medical history significant for GERD and diverticulitis who presented to the ED with 2 days worth of lower abdominal pain of sudden onset that is rapidly worsening.  He has had fever at home with temperature up to 100.8 Fahrenheit.  He has not had any diarrhea, dark, or tarry stools.  He denies any blood in his stools as well.  He had recently seen his gastroenterologist Dr. Laural Golden just 1 week ago with no symptomatology at that time.  Patient also admits to having some nausea with some emesis.  His last episode of diverticulitis was approximately 1 year ago and he has not had any flareups until now. Patient denies any chills, hematemesis, chest pain, shortness of breath, or other symptomatology.   ED Course: Vital signs are stable, but patient is febrile with temperature of 101 Fahrenheit.  Heart rate is noted to be 103 and labs indicate leukocytosis of 19,200.  Creatinine is mildly elevated at 1.44 from his usual baseline near 1.2.  Glucose is 116.  Patient has been started on ciprofloxacin as well as Flagyl.  He is also been given some morphine for pain as well as 1 L IV fluid.  Review of Systems: All others reviewed and otherwise negative.  Past Medical History:  Diagnosis Date  . Diverticulitis   . GERD (gastroesophageal reflux disease) 01/08/2018  . Seasonal allergies     Past Surgical History:  Procedure Laterality Date  . COLONOSCOPY N/A 09/22/2017   Procedure: COLONOSCOPY;  Surgeon: Rogene Houston, MD;  [facility];  Service: Endoscopy;  Laterality: N/A;  9:55  . POLYPECTOMY  09/22/2017   Procedure: POLYPECTOMY;  Surgeon: Rogene Houston, MD;  Location[facility];  Service: Endoscopy;;     reports that he has never smoked. He has never used  smokeless tobacco. He reports that he drinks alcohol. He reports that he does not use drugs.  Allergies  Allergen Reactions  . Claritin [Loratadine] Other (See Comments)    Heart race    Family History  Problem Relation Age of Onset  . Colon cancer Maternal Grandmother   . Colon cancer Maternal Grandfather     Prior to Admission medications   Medication Sig Start Date End Date Taking? Authorizing Provider  acetaminophen (TYLENOL) 500 MG tablet Take 500 mg by mouth every 6 (six) hours as needed.    [provider]  diphenhydrAMINE (BENADRYL) 25 MG tablet Take 25 mg daily as needed by mouth for itching or allergies.     [provider]  docusate sodium (COLACE) 100 MG capsule Take 100 mg by mouth as needed for mild constipation.    [provider]  pantoprazole (PROTONIX) 40 MG tablet Take 1 tablet (40 mg total) by mouth daily before breakfast. 07/08/18   Butch Penny, NP    Physical Exam: Vitals:   07/24/18 0130 07/24/18 0258 07/24/18 0417 07/24/18 0430  BP: 117/80  123/76 119/83  Pulse: (!) 103  96 93  Resp: 15  18 18   Temp: (!) 100.7 F (38.2 C) (!) 101 F (38.3 C)    TempSrc: Oral Oral    SpO2: 98%  92% 94%  Weight:      Height:        Constitutional: NAD,  calm, comfortable Vitals:   07/24/18 0130 07/24/18 0258 07/24/18 0417 07/24/18 0430  BP: 117/80  123/76 119/83  Pulse: (!) 103  96 93  Resp: 15  18 18   Temp: (!) 100.7 F (38.2 C) (!) 101 F (38.3 C)    TempSrc: Oral Oral    SpO2: 98%  92% 94%  Weight:      Height:       Eyes: lids and conjunctivae normal ENMT: Mucous membranes are moist.  Neck: normal, supple Respiratory: clear to auscultation bilaterally. Normal respiratory effort. No accessory muscle use.  Cardiovascular: Regular rate and rhythm, no murmurs. No extremity edema. Abdomen: Tenderness to palpation over bilateral lower quadrants, no distention. Bowel sounds positive.  No guarding rigidity or  rebound. Musculoskeletal:  No joint deformity upper and lower extremities.   Skin: no rashes, lesions, ulcers.  Psychiatric: Normal judgment and insight. Alert and oriented x 3. Normal mood.   Labs on Admission: I have personally reviewed following labs and imaging studies  CBC: Recent Labs  Lab 07/24/18 0225  WBC 19.2*  HGB 16.3  HCT 47.9  MCV 90.4  PLT 124   Basic Metabolic Panel: Recent Labs  Lab 07/24/18 0225  NA 139  K 4.0  CL 104  CO2 29  GLUCOSE 116*  BUN 13  CREATININE 1.44*  CALCIUM 9.2   GFR: Estimated Creatinine Clearance: 83.9 mL/min (A) (by C-G formula based on SCr of 1.44 mg/dL (H)). Liver Function Tests: Recent Labs  Lab 07/24/18 0225  AST 19  ALT 22  ALKPHOS 62  BILITOT 2.1*  PROT 8.0  ALBUMIN 4.5   Recent Labs  Lab 07/24/18 0225  LIPASE 28   No results for input(s): AMMONIA in the last 168 hours. Coagulation Profile: No results for input(s): INR, PROTIME in the last 168 hours. Cardiac Enzymes: No results for input(s): CKTOTAL, CKMB, CKMBINDEX, TROPONINI in the last 168 hours. BNP (last 3 results) No results for input(s): PROBNP in the last 8760 hours. HbA1C: No results for input(s): HGBA1C in the last 72 hours. CBG: No results for input(s): GLUCAP in the last 168 hours. Lipid Profile: No results for input(s): CHOL, HDL, LDLCALC, TRIG, CHOLHDL, LDLDIRECT in the last 72 hours. Thyroid Function Tests: No results for input(s): TSH, T4TOTAL, FREET4, T3FREE, THYROIDAB in the last 72 hours. Anemia Panel: No results for input(s): VITAMINB12, FOLATE, FERRITIN, TIBC, IRON, RETICCTPCT in the last 72 hours. Urine analysis:    Component Value Date/Time   COLORURINE AMBER (A) 07/24/2018 0220   APPEARANCEUR HAZY (A) 07/24/2018 0220   LABSPEC 1.026 07/24/2018 0220   PHURINE 6.0 07/24/2018 0220   GLUCOSEU NEGATIVE 07/24/2018 0220   HGBUR NEGATIVE 07/24/2018 0220   BILIRUBINUR NEGATIVE 07/24/2018 0220   KETONESUR NEGATIVE 07/24/2018 0220    PROTEINUR 30 (A) 07/24/2018 0220   UROBILINOGEN 0.2 04/18/2010 2028   NITRITE NEGATIVE 07/24/2018 0220   LEUKOCYTESUR NEGATIVE 07/24/2018 0220    Radiological Exams on Admission: Ct Abdomen Pelvis W Contrast  Result Date: 07/24/2018 CLINICAL DATA:  Lower abdominal pain diverticulitis suspected history of diverticulitis. EXAM: CT ABDOMEN AND PELVIS WITH CONTRAST TECHNIQUE: Multidetector CT imaging of the abdomen and pelvis was performed using the standard protocol following bolus administration of intravenous contrast. CONTRAST:  120mL ISOVUE-300 IOPAMIDOL (ISOVUE-300) INJECTION 61% COMPARISON:  CT 18 18 FINDINGS: Lower chest: No pleural effusion or consolidation. Moderate hiatal hernia, similar to prior exam. Hepatobiliary: Hepatic steatosis without focal lesion. Gallbladder physiologically distended, no calcified stone. No biliary dilatation. Pancreas: No ductal dilatation  or inflammation. Spleen: Normal in size without focal abnormality. Splenule at the hilum. Adrenals/Urinary Tract: Normal adrenal glands. No hydronephrosis or perinephric edema. Urinary bladder is nondistended. Stomach/Bowel: Acute sigmoid colonic diverticulitis with large amount of inflammatory stranding about inflamed diverticulum in the mid sigmoid. Associated colonic wall thickening small amount of free fluid. No focal fluid collection or abscess. There is a focus of air in the region of inflammation, image 75 series 2 and image 47 series 5, equivocal for air in a diverticulum versus micro perforation. Small adjacent mesenteric nodes measuring 5 mm short axis. Moderate stool in the more proximal colon. Normal appendix. No small bowel dilatation or inflammation. Moderate hiatal hernia. Vascular/Lymphatic: Few prominent pericolonic nodes. No retroperitoneal adenopathy. No acute vascular finding. No mesenteric or portal venous gas. Reproductive: Prostate is unremarkable. Other: Edema and small amount of free fluid in the pelvis related  to diverticulitis, no free fluid elsewhere. No abscess. Small fat containing umbilical hernia. Musculoskeletal: There are no acute or suspicious osseous abnormalities. IMPRESSION: 1. Acute sigmoid diverticulitis. Focus of air in the region of inflammation is felt to be within the inflamed diverticulum rather than micro perforation. No abscess. 2. Hepatic steatosis. 3. Moderate hiatal hernia. Electronically Signed   By: Keith Rake M.D.   On: 07/24/2018 03:44    Assessment/Plan Principal Problem:   Sigmoid diverticulitis Active Problems:   GERD (gastroesophageal reflux disease)    1. Acute sigmoid diverticulitis.  Maintain on IV fluid as well as ciprofloxacin and Flagyl.  Continue to monitor for symptomatic improvement.  Maintain on clear liquid diet and advance as tolerated with Zofran as needed for nausea or vomiting.  Consider GI consultation if no significant improvement noted in the next 24 to 48 hours.  Pain management with Dilaudid as needed for breakthrough pain.  Maintain on Colace.  Tylenol as needed for fever. 2. Renal insufficiency.  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  Continue maintain on IV fluid and repeat labs. 3. GERD.  Maintain on PPI.   DVT prophylaxis: Heparin Code Status: Full Family Communication: Mother at bedside Disposition Plan: Treatment of acute diverticulitis Consults called: None Admission status: Inpatient, MedSurg   Silvanna Ohmer Darleen Crocker DO [facility] Pager [phone]  If 7PM-7AM, please contact night-coverage [email or web address] Password [facility]  07/24/2018, 5:12 AM

## 2018-07-24 NOTE — Progress Notes (Signed)
07/24/2018  1:40 AM  07/24/2018 8:28 AM  Elijah Shaffer was seen and examined.  The H&P by the admitting provider, orders, imaging was reviewed.  Please see new orders.  Will continue to follow.   Vitals:   07/24/18 0731 07/24/18 0814  BP:  132/80  Pulse:  87  Resp: 18 19  Temp:  99.4 F (37.4 C)  SpO2: 98% 96%   Results for orders placed or performed during the hospital encounter of 07/24/18  Lipase, blood  Result Value Ref Range   Lipase 28 11 - 51 U/L  Comprehensive metabolic panel  Result Value Ref Range   Sodium 139 135 - 145 mmol/L   Potassium 4.0 3.5 - 5.1 mmol/L   Chloride 104 98 - 111 mmol/L   CO2 29 22 - 32 mmol/L   Glucose, Bld 116 (H) 70 - 99 mg/dL   BUN 13 6 - 20 mg/dL   Creatinine, Ser 1.44 (H) 0.61 - 1.24 mg/dL   Calcium 9.2 8.9 - 10.3 mg/dL   Total Protein 8.0 6.5 - 8.1 g/dL   Albumin 4.5 3.5 - 5.0 g/dL   AST 19 15 - 41 U/L   ALT 22 0 - 44 U/L   Alkaline Phosphatase 62 38 - 126 U/L   Total Bilirubin 2.1 (H) 0.3 - 1.2 mg/dL   GFR calc non Af Amer >60 >60 mL/min   GFR calc Af Amer >60 >60 mL/min   Anion gap 6 5 - 15  CBC  Result Value Ref Range   WBC 19.2 (H) 4.0 - 10.5 K/uL   RBC 5.30 4.22 - 5.81 MIL/uL   Hemoglobin 16.3 13.0 - 17.0 g/dL   HCT 47.9 39.0 - 52.0 %   MCV 90.4 78.0 - 100.0 fL   MCH 30.8 26.0 - 34.0 pg   MCHC 34.0 30.0 - 36.0 g/dL   RDW 12.7 11.5 - 15.5 %   Platelets 198 150 - 400 K/uL  Urinalysis, Routine w reflex microscopic  Result Value Ref Range   Color, Urine AMBER (A) YELLOW   APPearance HAZY (A) CLEAR   Specific Gravity, Urine 1.026 1.005 - 1.030   pH 6.0 5.0 - 8.0   Glucose, UA NEGATIVE NEGATIVE mg/dL   Hgb urine dipstick NEGATIVE NEGATIVE   Bilirubin Urine NEGATIVE NEGATIVE   Ketones, ur NEGATIVE NEGATIVE mg/dL   Protein, ur 30 (A) NEGATIVE mg/dL   Nitrite NEGATIVE NEGATIVE   Leukocytes, UA NEGATIVE NEGATIVE   RBC / HPF 0-5 0 - 5 RBC/hpf   WBC, UA 0-5 0 - 5 WBC/hpf   Bacteria, UA NONE SEEN NONE SEEN   Mucus PRESENT      Elijah Natal, MD [facility]

## 2018-07-24 NOTE — ED Triage Notes (Signed)
Lower abd pain onset Thursday, states he feels like when he had diverticulitis within the last year.  Pt has vomited a few times and also states he has not had good bm's in the past few days.

## 2018-07-24 NOTE — ED Notes (Signed)
Hospitalist at bedside 

## 2018-07-25 DIAGNOSIS — K219 Gastro-esophageal reflux disease without esophagitis: Secondary | ICD-10-CM

## 2018-07-25 DIAGNOSIS — N182 Chronic kidney disease, stage 2 (mild): Secondary | ICD-10-CM

## 2018-07-25 LAB — CBC
HEMATOCRIT: 41.9 % (ref 39.0–52.0)
Hemoglobin: 14.2 g/dL (ref 13.0–17.0)
MCH: 31.1 pg (ref 26.0–34.0)
MCHC: 33.9 g/dL (ref 30.0–36.0)
MCV: 91.7 fL (ref 78.0–100.0)
PLATELETS: 185 10*3/uL (ref 150–400)
RBC: 4.57 MIL/uL (ref 4.22–5.81)
RDW: 12.8 % (ref 11.5–15.5)
WBC: 14 10*3/uL — AB (ref 4.0–10.5)

## 2018-07-25 LAB — COMPREHENSIVE METABOLIC PANEL
ALT: 18 U/L (ref 0–44)
AST: 13 U/L — ABNORMAL LOW (ref 15–41)
Albumin: 3.6 g/dL (ref 3.5–5.0)
Alkaline Phosphatase: 52 U/L (ref 38–126)
Anion gap: 7 (ref 5–15)
BILIRUBIN TOTAL: 1.8 mg/dL — AB (ref 0.3–1.2)
BUN: 13 mg/dL (ref 6–20)
CO2: 27 mmol/L (ref 22–32)
CREATININE: 1.31 mg/dL — AB (ref 0.61–1.24)
Calcium: 8.7 mg/dL — ABNORMAL LOW (ref 8.9–10.3)
Chloride: 102 mmol/L (ref 98–111)
Glucose, Bld: 102 mg/dL — ABNORMAL HIGH (ref 70–99)
POTASSIUM: 3.6 mmol/L (ref 3.5–5.1)
Sodium: 136 mmol/L (ref 135–145)
TOTAL PROTEIN: 6.9 g/dL (ref 6.5–8.1)

## 2018-07-25 LAB — HIV ANTIBODY (ROUTINE TESTING W REFLEX): HIV SCREEN 4TH GENERATION: NONREACTIVE

## 2018-07-25 MED ORDER — INFLUENZA VAC SPLIT QUAD 0.5 ML IM SUSY: 0.5000 mL | PREFILLED_SYRINGE | INTRAMUSCULAR | Status: DC

## 2018-07-25 NOTE — Progress Notes (Signed)
PROGRESS NOTE    SAMBA CUMBA  DGL:[id number]  DOB: 1981-08-18  DOA: 07/24/2018 PCP: Jani Gravel, MD  Brief Admission Hx: VENIAMIN KINCAID is a 37 y.o. male with medical history significant for GERD and diverticulitis who presented to the ED with 2 days worth of lower abdominal pain of sudden onset that is rapidly worsening.  He was admitted with a second bout of diverticulitis.  MDM/Assessment & Plan:   1. Acute sigmoid diverticulitis. Clinically improving.  Continue IV ciprofloxacin and Flagyl.  Continue to monitor for symptomatic improvement.  Advance to full liquid diet.  Advance as tolerated to soft diet.   Pain management with Dilaudid as needed for breakthrough pain.  Maintain on Colace.  Tylenol as needed for fever. 2. Renal insufficiency.  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 Slight improvement with hydration.  3. GERD.  Maintain on PPI.  DVT prophylaxis: Heparin Code Status: Full Family Communication: Mother at bedside Disposition Plan:  continue IV antibiotics Consults called: None Admission status: Inpatient, MedSurg   Antimicrobials:  Ciprofloxacin 9/13  Metronidazole 9/13   Subjective: Patient says he is starting to feel better he is still having tenderness in the left lower quadrant but it is improving.  He was able to tolerate clear liquids.  Objective: Vitals:   07/24/18 1100 07/24/18 1321 07/24/18 2129 07/25/18 0551  BP:  138/86 116/78 115/75  Pulse:  87 71 79  Resp:   18 18  Temp: 100 F (37.8 C) 99.5 F (37.5 C) 98.6 F (37 C) 98.3 F (36.8 C)  TempSrc: Oral Oral Oral Oral  SpO2:  98% 97% 93%  Weight:      Height:        Intake/Output Summary (Last 24 hours) at 07/25/2018 0845 Last data filed at 07/25/2018 5188 Gross per 24 hour  Intake 2752.76 ml  Output -  Net 2752.76 ml   Filed Weights   07/24/18 0129 07/24/18 0814  Weight: 99.8 kg 107.5 kg   REVIEW OF SYSTEMS  As per history otherwise all reviewed and reported  negative  Exam:  General exam: awake, alert, NAD, cooperative.  Respiratory system: Clear. No increased work of breathing. Cardiovascular system: S1 & S2 heard, RRR. No JVD, murmurs, gallops, clicks or pedal edema. Gastrointestinal system: Abdomen is nondistended, soft and mild LLQ tenderness, no guarding. Normal bowel sounds heard. Central nervous system: Alert and oriented. No focal neurological deficits. Extremities: no CCE.  Data Reviewed: Basic Metabolic Panel: Recent Labs  Lab 07/24/18 0225 07/25/18 0623  NA 139 136  K 4.0 3.6  CL 104 102  CO2 29 27  GLUCOSE 116* 102*  BUN 13 13  CREATININE 1.44* 1.31*  CALCIUM 9.2 8.7*   Liver Function Tests: Recent Labs  Lab 07/24/18 0225 07/25/18 0623  AST 19 13*  ALT 22 18  ALKPHOS 62 52  BILITOT 2.1* 1.8*  PROT 8.0 6.9  ALBUMIN 4.5 3.6   Recent Labs  Lab 07/24/18 0225  LIPASE 28   No results for input(s): AMMONIA in the last 168 hours. CBC: Recent Labs  Lab 07/24/18 0225 07/25/18 0623  WBC 19.2* 14.0*  HGB 16.3 14.2  HCT 47.9 41.9  MCV 90.4 91.7  PLT 198 185   Cardiac Enzymes: No results for input(s): CKTOTAL, CKMB, CKMBINDEX, TROPONINI in the last 168 hours. CBG (last 3)  No results for input(s): GLUCAP in the last 72 hours. No results found for this or any previous visit (from the past 240 hour(s)).   Studies:  Ct Abdomen Pelvis W Contrast  Result Date: 07/24/2018 CLINICAL DATA:  Lower abdominal pain diverticulitis suspected history of diverticulitis. EXAM: CT ABDOMEN AND PELVIS WITH CONTRAST TECHNIQUE: Multidetector CT imaging of the abdomen and pelvis was performed using the standard protocol following bolus administration of intravenous contrast. CONTRAST:  175mL ISOVUE-300 IOPAMIDOL (ISOVUE-300) INJECTION 61% COMPARISON:  CT 18 18 FINDINGS: Lower chest: No pleural effusion or consolidation. Moderate hiatal hernia, similar to prior exam. Hepatobiliary: Hepatic steatosis without focal lesion. Gallbladder  physiologically distended, no calcified stone. No biliary dilatation. Pancreas: No ductal dilatation or inflammation. Spleen: Normal in size without focal abnormality. Splenule at the hilum. Adrenals/Urinary Tract: Normal adrenal glands. No hydronephrosis or perinephric edema. Urinary bladder is nondistended. Stomach/Bowel: Acute sigmoid colonic diverticulitis with large amount of inflammatory stranding about inflamed diverticulum in the mid sigmoid. Associated colonic wall thickening small amount of free fluid. No focal fluid collection or abscess. There is a focus of air in the region of inflammation, image 75 series 2 and image 47 series 5, equivocal for air in a diverticulum versus micro perforation. Small adjacent mesenteric nodes measuring 5 mm short axis. Moderate stool in the more proximal colon. Normal appendix. No small bowel dilatation or inflammation. Moderate hiatal hernia. Vascular/Lymphatic: Few prominent pericolonic nodes. No retroperitoneal adenopathy. No acute vascular finding. No mesenteric or portal venous gas. Reproductive: Prostate is unremarkable. Other: Edema and small amount of free fluid in the pelvis related to diverticulitis, no free fluid elsewhere. No abscess. Small fat containing umbilical hernia. Musculoskeletal: There are no acute or suspicious osseous abnormalities. IMPRESSION: 1. Acute sigmoid diverticulitis. Focus of air in the region of inflammation is felt to be within the inflamed diverticulum rather than micro perforation. No abscess. 2. Hepatic steatosis. 3. Moderate hiatal hernia. Electronically Signed   By: Keith Rake M.D.   On: 07/24/2018 03:44   Scheduled Meds: . heparin  5,000 Units Subcutaneous Q8H  . [START ON 07/26/2018] Influenza vac split quadrivalent PF  0.5 mL Intramuscular Tomorrow-1000  . pantoprazole  40 mg Oral QAC breakfast   Continuous Infusions: . ciprofloxacin 200 mL/hr at 07/25/18 1552  . lactated ringers 50 mL/hr at 07/25/18 0650  .  metronidazole Stopped (07/25/18 0559)    Principal Problem:   Sigmoid diverticulitis Active Problems:   Diverticulitis   GERD (gastroesophageal reflux disease)   Time spent:   Irwin Brakeman, MD, FAAFP [facility] Pager [phone]  If 7PM-7AM, please contact night-coverage [email or web address] Password [email or web address] 07/25/2018, 8:45 AM    LOS: 1 day

## 2018-07-26 DIAGNOSIS — N179 Acute kidney failure, unspecified: Secondary | ICD-10-CM

## 2018-07-26 LAB — CBC
HCT: 43.2 % (ref 39.0–52.0)
Hemoglobin: 14.5 g/dL (ref 13.0–17.0)
MCH: 30.4 pg (ref 26.0–34.0)
MCHC: 33.6 g/dL (ref 30.0–36.0)
MCV: 90.6 fL (ref 78.0–100.0)
Platelets: 201 10*3/uL (ref 150–400)
RBC: 4.77 MIL/uL (ref 4.22–5.81)
RDW: 12.7 % (ref 11.5–15.5)
WBC: 8.1 10*3/uL (ref 4.0–10.5)

## 2018-07-26 MED ORDER — CIPROFLOXACIN HCL 250 MG PO TABS: 250.0000 mg | ORAL_TABLET | Freq: Two times a day (BID) | ORAL | 0 refills | Status: AC

## 2018-07-26 MED ORDER — METRONIDAZOLE 500 MG PO TABS: 500.0000 mg | ORAL_TABLET | Freq: Three times a day (TID) | ORAL | 0 refills | Status: AC

## 2018-07-26 MED ORDER — CIPROFLOXACIN HCL 250 MG PO TABS: 250.0000 mg | ORAL_TABLET | Freq: Two times a day (BID) | ORAL | Status: DC

## 2018-07-26 MED ORDER — HYDROCODONE-ACETAMINOPHEN 5-325 MG PO TABS: 1.0000 | ORAL_TABLET | Freq: Four times a day (QID) | ORAL | 0 refills | Status: AC | PRN

## 2018-07-26 MED ORDER — HYDROCODONE-ACETAMINOPHEN 5-325 MG PO TABS
1.0000 | ORAL_TABLET | Freq: Four times a day (QID) | ORAL | Status: DC
  Administered 2018-07-26: 1 via ORAL
  Filled 2018-07-26: qty 1

## 2018-07-26 MED ORDER — METRONIDAZOLE 500 MG PO TABS: 500.0000 mg | ORAL_TABLET | Freq: Three times a day (TID) | ORAL | Status: DC

## 2018-07-26 NOTE — Discharge Summary (Signed)
Physician Discharge Summary  Elijah Shaffer DQQ:[id number] DOB: November 07, 1981 DOA: 07/24/2018  PCP: Jani Gravel, MD GI: Rehman  Admit date: 07/24/2018 Discharge date: 07/26/2018  Admitted From: HOME  Disposition: HOME   Discharge Condition: STABLE   CODE STATUS: FULL    Brief Hospitalization Summary: Please see all hospital notes, images, labs for full details of the hospitalization. Dr.Shah's HPI: Elijah Shaffer is a 37 y.o. male with medical history significant for GERD and diverticulitis who presented to the ED with 2 days worth of lower abdominal pain of sudden onset that is rapidly worsening.  He has had fever at home with temperature up to 100.8 Fahrenheit.  He has not had any diarrhea, dark, or tarry stools.  He denies any blood in his stools as well.  He had recently seen his gastroenterologist Dr. Laural Golden just 1 week ago with no symptomatology at that time.  Patient also admits to having some nausea with some emesis.  His last episode of diverticulitis was approximately 1 year ago and he has not had any flareups until now. Patient denies any chills, hematemesis, chest pain, shortness of breath, or other symptomatology.   ED Course: Vital signs are stable, but patient is febrile with temperature of 101 Fahrenheit.  Heart rate is noted to be 103 and labs indicate leukocytosis of 19,200.  Creatinine is mildly elevated at 1.44 from his usual baseline near 1.2.  Glucose is 116.  Patient has been started on ciprofloxacin as well as Flagyl.  He is also been given some morphine for pain as well as 1 L IV fluid.  Brief Admission Hx: Elijah Spiering Hubbardis a 37 y.o.malewith medical history significant forGERD and diverticulitis who presented to the ED with 2 days worth of lower abdominal pain of sudden onset that is rapidly worsening.  He was admitted with a second bout of diverticulitis.  MDM/Assessment & Plan:   1. Acute sigmoid diverticulitis. Clinically improving. He was treated with IV  ciprofloxacin and Flagyl. He is tolerating his soft diet now.  He has had BMs.   He should continue cipro and flagyl at home for another 7 days. Soft diet recommended for at least 1 week. Instructions given to patient.  Tylenol as needed for fever.  Follow up with Dr. Laural Golden GI outpatient.   2. Renal insufficiency. Pt had some improvement with hydration.   Follow up with PCP outpatient.  3. GERD. Treated with PPI.  DVT prophylaxis:Heparin Code Status:Full Family Communication:Mother at bedside Disposition Plan: continue IV antibiotics Consults called:None Admission status:Inpatient, MedSurg  Antimicrobials:  Ciprofloxacin 9/13  Metronidazole 9/13   Discharge Diagnoses:  Principal Problem:   Sigmoid diverticulitis Active Problems:   Acute renal injury ([place])   Diverticulitis of colon   GERD (gastroesophageal reflux disease)  Discharge Instructions: Discharge Instructions    Call MD for:  difficulty breathing, headache or visual disturbances   Complete by:  As directed    Call MD for:  extreme fatigue   Complete by:  As directed    Call MD for:  hives   Complete by:  As directed    Call MD for:  persistant dizziness or light-headedness   Complete by:  As directed    Call MD for:  persistant nausea and vomiting   Complete by:  As directed    Call MD for:  severe uncontrolled pain   Complete by:  As directed    Increase activity slowly   Complete by:  As directed  Allergies as of 07/26/2018      Reactions   Claritin [loratadine] Other (See Comments)   Heart race      Medication List    TAKE these medications   acetaminophen 500 MG tablet Commonly known as:  TYLENOL Take 500 mg by mouth every 6 (six) hours as needed.   ciprofloxacin 250 MG tablet Commonly known as:  CIPRO Take 1 tablet (250 mg total) by mouth 2 (two) times daily for 7 days.   diphenhydrAMINE 25 MG tablet Commonly known as:  BENADRYL Take 25 mg daily as needed by mouth for  itching or allergies.   docusate sodium 100 MG capsule Commonly known as:  COLACE Take 100 mg by mouth as needed for mild constipation.   HYDROcodone-acetaminophen 5-325 MG tablet Commonly known as:  NORCO/VICODIN Take 1 tablet by mouth every 6 (six) hours as needed for up to 3 days.   metroNIDAZOLE 500 MG tablet Commonly known as:  FLAGYL Take 1 tablet (500 mg total) by mouth every 8 (eight) hours for 7 days.   pantoprazole 40 MG tablet Commonly known as:  PROTONIX Take 1 tablet (40 mg total) by mouth daily before breakfast.      Follow-up Information    Jani Gravel, MD. Schedule an appointment as soon as possible for a visit in 1 week(s).   Specialty:  Internal Medicine Why:  Hospital Follow Up  Contact information: [place] [address] [phone]        Rogene Houston, MD. Schedule an appointment as soon as possible for a visit in 2 week(s).   Specialty:  Gastroenterology Why:  Hospital Follow Up Contact information: [address], [address]  [place] [address] [phone]          Allergies  Allergen Reactions  . Claritin [Loratadine] Other (See Comments)    Heart race   Allergies as of 07/26/2018      Reactions   Claritin [loratadine] Other (See Comments)   Heart race      Medication List    TAKE these medications   acetaminophen 500 MG tablet Commonly known as:  TYLENOL Take 500 mg by mouth every 6 (six) hours as needed.   ciprofloxacin 250 MG tablet Commonly known as:  CIPRO Take 1 tablet (250 mg total) by mouth 2 (two) times daily for 7 days.   diphenhydrAMINE 25 MG tablet Commonly known as:  BENADRYL Take 25 mg daily as needed by mouth for itching or allergies.   docusate sodium 100 MG capsule Commonly known as:  COLACE Take 100 mg by mouth as needed for mild constipation.   HYDROcodone-acetaminophen 5-325 MG tablet Commonly known as:  NORCO/VICODIN Take 1 tablet by mouth every 6 (six) hours as  needed for up to 3 days.   metroNIDAZOLE 500 MG tablet Commonly known as:  FLAGYL Take 1 tablet (500 mg total) by mouth every 8 (eight) hours for 7 days.   pantoprazole 40 MG tablet Commonly known as:  PROTONIX Take 1 tablet (40 mg total) by mouth daily before breakfast.       Procedures/Studies: Ct Abdomen Pelvis W Contrast  Result Date: 07/24/2018 CLINICAL DATA:  Lower abdominal pain diverticulitis suspected history of diverticulitis. EXAM: CT ABDOMEN AND PELVIS WITH CONTRAST TECHNIQUE: Multidetector CT imaging of the abdomen and pelvis was performed using the standard protocol following bolus administration of intravenous contrast. CONTRAST:  137mL ISOVUE-300 IOPAMIDOL (ISOVUE-300) INJECTION 61% COMPARISON:  CT 18 18 FINDINGS: Lower chest: No  pleural effusion or consolidation. Moderate hiatal hernia, similar to prior exam. Hepatobiliary: Hepatic steatosis without focal lesion. Gallbladder physiologically distended, no calcified stone. No biliary dilatation. Pancreas: No ductal dilatation or inflammation. Spleen: Normal in size without focal abnormality. Splenule at the hilum. Adrenals/Urinary Tract: Normal adrenal glands. No hydronephrosis or perinephric edema. Urinary bladder is nondistended. Stomach/Bowel: Acute sigmoid colonic diverticulitis with large amount of inflammatory stranding about inflamed diverticulum in the mid sigmoid. Associated colonic wall thickening small amount of free fluid. No focal fluid collection or abscess. There is a focus of air in the region of inflammation, image 75 series 2 and image 47 series 5, equivocal for air in a diverticulum versus micro perforation. Small adjacent mesenteric nodes measuring 5 mm short axis. Moderate stool in the more proximal colon. Normal appendix. No small bowel dilatation or inflammation. Moderate hiatal hernia. Vascular/Lymphatic: Few prominent pericolonic nodes. No retroperitoneal adenopathy. No acute vascular finding. No mesenteric or  portal venous gas. Reproductive: Prostate is unremarkable. Other: Edema and small amount of free fluid in the pelvis related to diverticulitis, no free fluid elsewhere. No abscess. Small fat containing umbilical hernia. Musculoskeletal: There are no acute or suspicious osseous abnormalities. IMPRESSION: 1. Acute sigmoid diverticulitis. Focus of air in the region of inflammation is felt to be within the inflamed diverticulum rather than micro perforation. No abscess. 2. Hepatic steatosis. 3. Moderate hiatal hernia. Electronically Signed   By: Keith Rake M.D.   On: 07/24/2018 03:44      Subjective: Pt says that he is feeling much better and he is tolerating his diet.    Discharge Exam: Vitals:   07/25/18 2118 07/26/18 0626  BP: 118/64 133/77  Pulse: 64 76  Resp: 16 18  Temp: 98.4 F (36.9 C) 97.6 F (36.4 C)  SpO2: 96% 96%   Vitals:   07/25/18 0551 07/25/18 1437 07/25/18 2118 07/26/18 0626  BP: 115/75 124/72 118/64 133/77  Pulse: 79 67 64 76  Resp: 18 18 16 18   Temp: 98.3 F (36.8 C) 98.6 F (37 C) 98.4 F (36.9 C) 97.6 F (36.4 C)  TempSrc: Oral Oral Oral Oral  SpO2: 93% 95% 96% 96%  Weight:      Height:       General exam: awake, alert, NAD, cooperative.  Respiratory system: Clear. No increased work of breathing. Cardiovascular system: S1 & S2 heard, RRR. No JVD, murmurs, gallops, clicks or pedal edema. Gastrointestinal system: Abdomen is nondistended, soft and nontender, no guarding. Normal bowel sounds heard. Central nervous system: Alert and oriented. No focal neurological deficits. Extremities: no CCE.   The results of significant diagnostics from this hospitalization (including imaging, microbiology, ancillary and laboratory) are listed below for reference.    Microbiology: No results found for this or any previous visit (from the past 240 hour(s)).   Labs: BNP (last 3 results) No results for input(s): BNP in the last 8760 hours. Basic Metabolic  Panel: Recent Labs  Lab 07/24/18 0225 07/25/18 0623  NA 139 136  K 4.0 3.6  CL 104 102  CO2 29 27  GLUCOSE 116* 102*  BUN 13 13  CREATININE 1.44* 1.31*  CALCIUM 9.2 8.7*   Liver Function Tests: Recent Labs  Lab 07/24/18 0225 07/25/18 0623  AST 19 13*  ALT 22 18  ALKPHOS 62 52  BILITOT 2.1* 1.8*  PROT 8.0 6.9  ALBUMIN 4.5 3.6   Recent Labs  Lab 07/24/18 0225  LIPASE 28   No results for input(s): AMMONIA in the last 168  hours. CBC: Recent Labs  Lab 07/24/18 0225 07/25/18 0623 07/26/18 0618  WBC 19.2* 14.0* 8.1  HGB 16.3 14.2 14.5  HCT 47.9 41.9 43.2  MCV 90.4 91.7 90.6  PLT 198 185 201   Cardiac Enzymes: No results for input(s): CKTOTAL, CKMB, CKMBINDEX, TROPONINI in the last 168 hours. BNP: Invalid input(s): POCBNP CBG: No results for input(s): GLUCAP in the last 168 hours. D-Dimer No results for input(s): DDIMER in the last 72 hours. Hgb A1c No results for input(s): HGBA1C in the last 72 hours. Lipid Profile No results for input(s): CHOL, HDL, LDLCALC, TRIG, CHOLHDL, LDLDIRECT in the last 72 hours. Thyroid function studies No results for input(s): TSH, T4TOTAL, T3FREE, THYROIDAB in the last 72 hours.  Invalid input(s): FREET3 Anemia work up No results for input(s): VITAMINB12, FOLATE, FERRITIN, TIBC, IRON, RETICCTPCT in the last 72 hours. Urinalysis    Component Value Date/Time   COLORURINE AMBER (A) 07/24/2018 0220   APPEARANCEUR HAZY (A) 07/24/2018 0220   LABSPEC 1.026 07/24/2018 0220   PHURINE 6.0 07/24/2018 0220   GLUCOSEU NEGATIVE 07/24/2018 0220   HGBUR NEGATIVE 07/24/2018 0220   BILIRUBINUR NEGATIVE 07/24/2018 0220   KETONESUR NEGATIVE 07/24/2018 0220   PROTEINUR 30 (A) 07/24/2018 0220   UROBILINOGEN 0.2 04/18/2010 2028   NITRITE NEGATIVE 07/24/2018 0220   LEUKOCYTESUR NEGATIVE 07/24/2018 0220   Sepsis Labs Invalid input(s): PROCALCITONIN,  WBC,  LACTICIDVEN Microbiology No results found for this or any previous visit (from the  past 240 hour(s)).  Time coordinating discharge: 32 Minutes  SIGNED:  Irwin Brakeman, MD  [facility] 07/26/2018, 9:34 AM Pager [phone]  If 7PM-7AM, please contact night-coverage [email or web address] Password [email or web address]

## 2018-07-26 NOTE — Discharge Instructions (Signed)
Soft-Food Meal Plan A soft-food meal plan includes foods that are safe and easy to swallow. This meal plan typically is used:  If you are having trouble chewing or swallowing foods.  As a transition meal plan after only having had liquid meals for a long period.  What do I need to know about the soft-food meal plan? A soft-food meal plan includes tender foods that are soft and easy to chew and swallow. In most cases, bite-sized pieces of food are easier to swallow. A bite-sized piece is about  inch or smaller. Foods in this plan do not need to be ground or pureed. Foods that are very hard, crunchy, or sticky should be avoided. Also, breads, cereals, yogurts, and desserts with nuts, seeds, or fruits should be avoided. What foods can I eat? Grains Rice and wild rice. Moist bread, dressing, pasta, and noodles. Well-moistened dry or cooked cereals, such as farina (cooked wheat cereal), oatmeal, or grits. Biscuits, breads, muffins, pancakes, and waffles that have been well moistened. Vegetables Shredded lettuce. Cooked, tender vegetables, including potatoes without skins. Vegetable juices. Broths or creamed soups made with vegetables that are not stringy or chewy. Strained tomatoes (without seeds). Fruits Canned or well-cooked fruits. Soft (ripe), peeled fresh fruits, such as peaches, nectarines, kiwi, cantaloupe, honeydew melon, and watermelon (without seeds). Soft berries with small seeds, such as strawberries. Fruit juices (without pulp). Meats and Other Protein Sources Moist, tender, lean beef. Mutton. Lamb. Veal. Chicken. [place]. Liver. Ham. Fish without bones. Eggs. Dairy Milk, milk drinks, and cream. Plain cream cheese and cottage cheese. Plain yogurt. Sweets/Desserts Flavored gelatin desserts. Custard. Plain ice cream, frozen yogurt, sherbet, milk shakes, and malts. Plain cakes and cookies. Plain hard candy. Other Butter, margarine (without trans fat), and cooking oils. Mayonnaise. Cream  sauces. Mild spices, salt, and sugar. Syrup, molasses, honey, and jelly. The items listed above may not be a complete list of recommended foods or beverages. Contact your dietitian for more options. What foods are not recommended? Grains Dry bread, toast, crackers that have not been moistened. Coarse or dry cereals, such as bran, granola, and shredded wheat. Tough or chewy crusty breads, such as [place] bread or baguettes. Vegetables Corn. Raw vegetables except shredded lettuce. Cooked vegetables that are tough or stringy. Tough, crisp, fried potatoes and potato skins. Fruits Fresh fruits with skins or seeds or both, such as apples, pears, or grapes. Stringy, high-pulp fruits, such as papaya, pineapple, coconut, or mango. Fruit leather, fruit roll-ups, and all dried fruits. Meats and Other Protein Sources Sausages and hot dogs. Meats with gristle. Fish with bones. Nuts, seeds, and chunky peanut or other nut butters. Sweets/Desserts Cakes or cookies that are very dry or chewy. The items listed above may not be a complete list of foods and beverages to avoid. Contact your dietitian for more information. This information is not intended to replace advice given to you by your health care provider. Make sure you discuss any questions you have with your health care provider. Document Released: 02/04/2008 Document Revised: 04/04/2016 Document Reviewed: 09/24/2013 Elsevier Interactive Patient Education  2017 [place] When to eat soft foods Foods to eat List of foods to avoid Recipes Takeaway If you buy something through a link on this page, we may earn a small commission. How this works. Overview A soft foods diet is something doctors recommend after certain medical procedures. Its also called a low-fiber diet or a bland diet. As you can probably guess from the name,  a soft foods diet involves choosing foods that are soft, easy to chew, and gentle on your stomach. This  type of diet aims to make the digestive process easy on your body. A soft foods diet cant deliver all the nutrition that you need long term. Its a temporary solution for when your body needs to heal. Keep reading to find out what you need to know about the soft foods diet.  When should you eat a soft foods diet? A soft foods diet is sometimes advised when youve had a medical procedure that affects your digestive tract. Common medical conditions that might be helped by a soft foods diet include: gastroenteritis diverticulitis inflammatory bowel disease (IBD) flare-ups Its also useful following oral surgery, dental reconstruction, and throat surgery.  Foods to eat on a soft foods diet A soft foods diet is one of the only diets that will encourage you to eat low-fiber foods and refined, processed carbohydrates. You should also focus on eating canned vegetables and fruit as opposed to fresh produce, and try to get protein from softer sources such as eggs and well-cooked fish. Here are a few examples of what to eat. Shop for some of these foods by clicking the links below. pureed fruit (such as applesauce) canned fish and canned poultry fruit juice and vegetable juice white rice egg noodles white bread mashed potatoes bananas mangoes avocados cottage cheese tea You should also drink plenty of water when youre on a soft foods diet. This will keep you from getting dehydrated, which can slow the healing process of your body. It will also help keep your digestion going, even though you arent getting much fiber.   Foods to avoid on a soft foods diet As important as it is to eat soft food during this diet, its equally critical to avoid certain foods. These are foods that are high in fiber content or difficult for your body to digest. Examples of what to avoid include: whole-wheat and whole-grain breads raw vegetables, especially broccoli, cauliflower, and carrots beans and nuts,  including peanuts brown and wild rice berries high-fiber and fiber-enriched cereals carbonated beverages (such as soda and seltzer)  Diverticulitis Diverticulitis is when small pockets in your large intestine (colon) get infected or swollen. This causes stomach pain and watery poop (diarrhea). These pouches are called diverticula. They form in people who have a condition called diverticulosis. Follow these instructions at home: Medicines  Take over-the-counter and prescription medicines only as told by your doctor. These include: ? Antibiotics. ? Pain medicines. ? Fiber pills. ? Probiotics. ? Stool softeners.  Do not drive or use heavy machinery while taking prescription pain medicine.  If you were prescribed an antibiotic, take it as told. Do not stop taking it even if you feel better. General instructions  Follow a diet as told by your doctor.  When you feel better, your doctor may tell you to change your diet. You may need to eat a lot of fiber. Fiber makes it easier to poop (have bowel movements). Healthy foods with fiber include: ? Berries. ? Beans. ? Lentils. ? Green vegetables.  Exercise 3 or more times a week. Aim for 30 minutes each time. Exercise enough to sweat and make your heart beat faster.  Keep all follow-up visits as told. This is important. You may need to have an exam of the large intestine. This is called a colonoscopy. Contact a doctor if:  Your pain does not get better.  You have a hard time eating  or drinking.  You are not pooping like normal. Get help right away if:  Your pain gets worse.  Your problems do not get better.  Your problems get worse very fast.  You have a fever.  You throw up (vomit) more than one time.  You have poop that is: ? Bloody. ? Black. ? Tarry. Summary  Diverticulitis is when small pockets in your large intestine (colon) get infected or swollen.  Take medicines only as told by your doctor.  Follow a diet  as told by your doctor. This information is not intended to replace advice given to you by your health care provider. Make sure you discuss any questions you have with your health care provider. Document Released: 04/15/2008 Document Revised: 11/14/2016 Document Reviewed: 11/14/2016 Elsevier Interactive Patient Education  2017 [place]    Follow with Primary MD  Jani Gravel, MD  and other consultant's as instructed your Hospitalist MD  Please get a complete blood count and chemistry panel checked by your Primary MD at your next visit, and again as instructed by your Primary MD.  Get Medicines reviewed and adjusted: Please take all your medications with you for your next visit with your Primary MD  Laboratory/radiological data: Please request your Primary MD to go over all hospital tests and procedure/radiological results at the follow up, please ask your Primary MD to get all Hospital records sent to his/her office.  In some cases, they will be blood work, cultures and biopsy results pending at the time of your discharge. Please request that your primary care M.D. follows up on these results.  Also Note the following: If you experience worsening of your admission symptoms, develop shortness of breath, life threatening emergency, suicidal or homicidal thoughts you must seek medical attention immediately by calling 911 or calling your MD immediately  if symptoms less severe.  You must read complete instructions/literature along with all the possible adverse reactions/side effects for all the Medicines you take and that have been prescribed to you. Take any new Medicines after you have completely understood and accpet all the possible adverse reactions/side effects.   Do not drive when taking Pain medications or sleeping medications (Benzodaizepines)  Do not take more than prescribed Pain, Sleep and Anxiety Medications. It is not advisable to combine anxiety,sleep and pain medications  without talking with your primary care practitioner  Special Instructions: If you have smoked or chewed Tobacco  in the last 2 yrs please stop smoking, stop any regular Alcohol  and or any Recreational drug use.  Wear Seat belts while driving.  Please note: You were cared for by a hospitalist during your hospital stay. Once you are discharged, your primary care physician will handle any further medical issues. Please note that NO REFILLS for any discharge medications will be authorized once you are discharged, as it is imperative that you return to your primary care physician (or establish a relationship with a primary care physician if you do not have one) for your post hospital discharge needs so that they can reassess your need for medications and monitor your lab values.

## 2018-10-12 ENCOUNTER — Telehealth (INDEPENDENT_AMBULATORY_CARE_PROVIDER_SITE_OTHER): Payer: Self-pay | Admitting: Internal Medicine

## 2018-10-12 ENCOUNTER — Encounter (INDEPENDENT_AMBULATORY_CARE_PROVIDER_SITE_OTHER): Payer: Self-pay | Admitting: Internal Medicine

## 2018-10-12 DIAGNOSIS — K5732 Diverticulitis of large intestine without perforation or abscess without bleeding: Secondary | ICD-10-CM

## 2018-10-12 MED ORDER — METRONIDAZOLE 250 MG PO TABS: 250.0000 mg | ORAL_TABLET | Freq: Three times a day (TID) | ORAL | 0 refills | Status: DC

## 2018-10-12 MED ORDER — CIPROFLOXACIN HCL 500 MG PO TABS: 500.0000 mg | ORAL_TABLET | Freq: Two times a day (BID) | ORAL | 0 refills | Status: DC

## 2018-10-12 NOTE — Telephone Encounter (Signed)
He thinks he is having a diverticular flare. Am going to call in Cipro and flagyl to his pharmacy.

## 2018-10-14 ENCOUNTER — Ambulatory Visit (HOSPITAL_COMMUNITY)
Admission: RE | Admit: 2018-10-14 | Discharge: 2018-10-14 | Disposition: A | Discharge status: Home / Self Care | Payer: BLUE CROSS/BLUE SHIELD | Source: Ambulatory Visit | Attending: Internal Medicine | Admitting: Internal Medicine

## 2018-10-14 ENCOUNTER — Encounter (INDEPENDENT_AMBULATORY_CARE_PROVIDER_SITE_OTHER): Payer: Self-pay | Admitting: Internal Medicine

## 2018-10-14 ENCOUNTER — Ambulatory Visit (INDEPENDENT_AMBULATORY_CARE_PROVIDER_SITE_OTHER): Payer: BLUE CROSS/BLUE SHIELD | Admitting: Internal Medicine

## 2018-10-14 ENCOUNTER — Other Ambulatory Visit (HOSPITAL_COMMUNITY)
Admission: RE | Admit: 2018-10-14 | Discharge: 2018-10-14 | Disposition: A | Discharge status: Home / Self Care | Payer: BLUE CROSS/BLUE SHIELD | Source: Ambulatory Visit | Attending: Family Medicine | Admitting: Family Medicine

## 2018-10-14 DIAGNOSIS — R1030 Lower abdominal pain, unspecified: Secondary | ICD-10-CM | POA: Diagnosis not present

## 2018-10-14 DIAGNOSIS — K5732 Diverticulitis of large intestine without perforation or abscess without bleeding: Secondary | ICD-10-CM

## 2018-10-14 DIAGNOSIS — Z79899 Other long term (current) drug therapy: Secondary | ICD-10-CM | POA: Diagnosis not present

## 2018-10-14 DIAGNOSIS — R1031 Right lower quadrant pain: Secondary | ICD-10-CM | POA: Diagnosis not present

## 2018-10-14 LAB — CBC WITH DIFFERENTIAL/PLATELET
Abs Immature Granulocytes: 0.04 10*3/uL (ref 0.00–0.07)
Basophils Absolute: 0.1 10*3/uL (ref 0.0–0.1)
Basophils Relative: 1 %
EOS ABS: 0.1 10*3/uL (ref 0.0–0.5)
EOS PCT: 1 %
HEMATOCRIT: 49.4 % (ref 39.0–52.0)
HEMOGLOBIN: 15.7 g/dL (ref 13.0–17.0)
Immature Granulocytes: 0 %
LYMPHS PCT: 18 %
Lymphs Abs: 1.7 10*3/uL (ref 0.7–4.0)
MCH: 29 pg (ref 26.0–34.0)
MCHC: 31.8 g/dL (ref 30.0–36.0)
MCV: 91.1 fL (ref 80.0–100.0)
MONO ABS: 0.8 10*3/uL (ref 0.1–1.0)
Monocytes Relative: 8 %
Neutro Abs: 7 10*3/uL (ref 1.7–7.7)
Neutrophils Relative %: 72 %
Platelets: 245 10*3/uL (ref 150–400)
RBC: 5.42 MIL/uL (ref 4.22–5.81)
RDW: 13 % (ref 11.5–15.5)
WBC: 9.7 10*3/uL (ref 4.0–10.5)
nRBC: 0 % (ref 0.0–0.2)

## 2018-10-14 LAB — URINALYSIS, ROUTINE W REFLEX MICROSCOPIC
Bilirubin Urine: NEGATIVE
Glucose, UA: NEGATIVE mg/dL
Hgb urine dipstick: NEGATIVE
Ketones, ur: 20 mg/dL — AB
LEUKOCYTES UA: NEGATIVE
NITRITE: NEGATIVE
PH: 6 (ref 5.0–8.0)
PROTEIN: NEGATIVE mg/dL

## 2018-10-14 LAB — COMPREHENSIVE METABOLIC PANEL
ALK PHOS: 67 U/L (ref 38–126)
ALT: 39 U/L (ref 0–44)
AST: 27 U/L (ref 15–41)
Albumin: 4.5 g/dL (ref 3.5–5.0)
Anion gap: 10 (ref 5–15)
BUN: 17 mg/dL (ref 6–20)
CALCIUM: 9.4 mg/dL (ref 8.9–10.3)
CHLORIDE: 100 mmol/L (ref 98–111)
CO2: 26 mmol/L (ref 22–32)
CREATININE: 1.3 mg/dL — AB (ref 0.61–1.24)
GFR calc Af Amer: >60 mL/min (ref >60)
Glucose, Bld: 91 mg/dL (ref 70–99)
Potassium: 4.2 mmol/L (ref 3.5–5.1)
Sodium: 136 mmol/L (ref 135–145)
Total Bilirubin: 1 mg/dL (ref 0.3–1.2)
Total Protein: 8.6 g/dL — ABNORMAL HIGH (ref 6.5–8.1)

## 2018-10-14 LAB — HEMOGLOBIN A1C
Hgb A1c MFr Bld: 5 % (ref 4.8–5.6)
Mean Plasma Glucose: 96.8 mg/dL

## 2018-10-14 LAB — LIPID PANEL
CHOLESTEROL: 183 mg/dL (ref 0–200)
HDL: 47 mg/dL (ref >40)
LDL CALC: 104 mg/dL — AB (ref 0–99)
TRIGLYCERIDES: 158 mg/dL — AB (ref <150)
Total CHOL/HDL Ratio: 3.9 RATIO
VLDL: 32 mg/dL (ref 0–40)

## 2018-10-14 LAB — TSH: TSH: 1.373 u[IU]/mL (ref 0.350–4.500)

## 2018-10-14 MED ORDER — METRONIDAZOLE 250 MG PO TABS: 250.0000 mg | ORAL_TABLET | Freq: Three times a day (TID) | ORAL | 1 refills | Status: DC

## 2018-10-14 MED ORDER — IOPAMIDOL (ISOVUE-300) INJECTION 61%
100.0000 mL | Freq: Once | INTRAVENOUS | Status: AC | PRN
  Administered 2018-10-14: 100 mL via INTRAVENOUS

## 2018-10-14 MED ORDER — CIPROFLOXACIN HCL 500 MG PO TABS: 500.0000 mg | ORAL_TABLET | Freq: Two times a day (BID) | ORAL | 1 refills | Status: DC

## 2018-10-14 MED ORDER — IOPAMIDOL (ISOVUE-300) INJECTION 61%
30.0000 mL | Freq: Once | INTRAVENOUS | Status: AC | PRN
  Administered 2018-10-14: 30 mL via ORAL

## 2018-10-14 NOTE — Progress Notes (Signed)
   Subjective:    Patient ID: Elijah Shaffer, male    DOB: 11-10-1981, 37 y.o.   MRN: [id number]  HPI Presents today with c/o abdominal pain. Last seen in our office in August of this year.  Hx of GERD and diverticulitis. Last bout of diverticulitis was in September of this year. Underwent a CT scan 07/24/2018 which revealed acute sigmoid diverticulitis. Focus of air in the region of inflammation is felt to be within the inflamed diverticulum rather than microperforation. No abscess. He was admitted to [facility] and was covereed with IV Cipro and Flagyl. He was discharge with Rx for po Cipro and Flagyl x 7 days. He says he is having pain rt lower quadrant and moving across his abdomen. The pain started 2 days ago. I received a call yesterday and started him on Cipro and Flagyl. He says the pain is better 2 day. He says today he still is having some pain. He did have a BM this am x 2. Felt better after having a BM.  He says he does not know if he had a fever, but he had felt, and has been sweaty. Appetite is okay.   Review of Systems Past Medical History:  Diagnosis Date  . Diverticulitis   . GERD (gastroesophageal reflux disease) 01/08/2018  . Seasonal allergies     Past Surgical History:  Procedure Laterality Date  . COLONOSCOPY N/A 09/22/2017   Procedure: COLONOSCOPY;  Surgeon: Rogene Houston, MD;  Location: [facility];  Service: Endoscopy;  Laterality: N/A;  9:55  . POLYPECTOMY  09/22/2017   Procedure: POLYPECTOMY;  Surgeon: Rogene Houston, MD;  Location: [facility];  Service: Endoscopy;;    Allergies  Allergen Reactions  . Claritin [Loratadine] Other (See Comments)    Heart race    Current Outpatient Medications on File Prior to Visit  Medication Sig Dispense Refill  . acetaminophen (TYLENOL) 500 MG tablet Take 500 mg by mouth every 6 (six) hours as needed.    . docusate sodium (COLACE) 100 MG capsule Take 100 mg by mouth as needed for mild constipation.    . metroNIDAZOLE  (FLAGYL) 250 MG tablet Take 1 tablet (250 mg total) by mouth 3 (three) times daily. 30 tablet 0  . pantoprazole (PROTONIX) 40 MG tablet Take 1 tablet (40 mg total) by mouth daily before breakfast. 30 tablet 11   No current facility-administered medications on file prior to visit.         Objective:   Physical Exam Blood pressure 140/82, pulse 72, temperature 98.1 F (36.7 C), height 6\' 2"  (1.88 m), weight 237 lb 11.2 oz (107.8 kg). Alert and oriented. Skin warm and dry. Oral mucosa is moist.   . Sclera anicteric, conjunctivae is pink. Thyroid not enlarged. No cervical lymphadenopathy. Lungs clear. Heart regular rate and rhythm.  Abdomen is soft. Bowel sounds are positive. No hepatomegaly. No abdominal masses felt. Tenderness rt lower quadrant.   No edema to lower extremities.           Assessment & Plan:  Lower abdominal pain . Suspect he has diverticulitis. Am going to get a CT scan. He is presently taking Cipro and Flagyl which was started yesterday.

## 2018-10-14 NOTE — Patient Instructions (Addendum)
After u finish the present Cipro and Flagyl, take 4 more days worth of the 2nd Rx.  Bland diet for now.

## 2018-10-15 ENCOUNTER — Telehealth (INDEPENDENT_AMBULATORY_CARE_PROVIDER_SITE_OTHER): Payer: Self-pay | Admitting: Internal Medicine

## 2018-10-15 DIAGNOSIS — K6389 Other specified diseases of intestine: Secondary | ICD-10-CM

## 2018-10-15 DIAGNOSIS — K5732 Diverticulitis of large intestine without perforation or abscess without bleeding: Secondary | ICD-10-CM

## 2018-10-15 DIAGNOSIS — K5712 Diverticulitis of small intestine without perforation or abscess without bleeding: Secondary | ICD-10-CM

## 2018-10-15 MED ORDER — HYDROCODONE-ACETAMINOPHEN 5-325 MG PO TABS: 1.0000 | ORAL_TABLET | Freq: Four times a day (QID) | ORAL | 0 refills | Status: DC | PRN

## 2018-10-15 NOTE — Telephone Encounter (Signed)
Rx sent to her phamacy

## 2018-10-15 NOTE — Telephone Encounter (Signed)
err

## 2018-10-15 NOTE — Telephone Encounter (Signed)
Elijah Shaffer, Needs CT abdomen/pelvis about 2 weeks from now.

## 2018-10-15 NOTE — Telephone Encounter (Signed)
CT sch'd 10/30/18 at 815, npo 4 hours, pick up contrast, patient aware

## 2018-10-21 ENCOUNTER — Telehealth (INDEPENDENT_AMBULATORY_CARE_PROVIDER_SITE_OTHER): Payer: Self-pay | Admitting: Internal Medicine

## 2018-10-21 NOTE — Telephone Encounter (Signed)
Patient called wanted to know if he needs to be on an antibiotic - please call [phone]

## 2018-10-21 NOTE — Telephone Encounter (Signed)
Message left on answering machine 

## 2018-10-22 NOTE — Telephone Encounter (Signed)
I have spoken with patient 

## 2018-10-30 ENCOUNTER — Ambulatory Visit (HOSPITAL_COMMUNITY)
Admission: RE | Admit: 2018-10-30 | Discharge: 2018-10-30 | Disposition: A | Discharge status: Home / Self Care | Payer: BLUE CROSS/BLUE SHIELD | Source: Ambulatory Visit | Attending: Internal Medicine | Admitting: Internal Medicine

## 2018-10-30 ENCOUNTER — Encounter (HOSPITAL_COMMUNITY): Payer: Self-pay

## 2018-10-30 DIAGNOSIS — K5712 Diverticulitis of small intestine without perforation or abscess without bleeding: Secondary | ICD-10-CM | POA: Insufficient documentation

## 2018-10-30 DIAGNOSIS — K5732 Diverticulitis of large intestine without perforation or abscess without bleeding: Secondary | ICD-10-CM

## 2018-10-30 DIAGNOSIS — K6389 Other specified diseases of intestine: Secondary | ICD-10-CM

## 2018-10-30 MED ORDER — IOPAMIDOL (ISOVUE-300) INJECTION 61%
100.0000 mL | Freq: Once | INTRAVENOUS | Status: AC | PRN
  Administered 2018-10-30: 100 mL via INTRAVENOUS

## 2018-12-16 DIAGNOSIS — M25521 Pain in right elbow: Secondary | ICD-10-CM | POA: Diagnosis not present

## 2018-12-23 DIAGNOSIS — M25521 Pain in right elbow: Secondary | ICD-10-CM | POA: Diagnosis not present

## 2018-12-25 DIAGNOSIS — M25521 Pain in right elbow: Secondary | ICD-10-CM | POA: Diagnosis not present

## 2018-12-31 DIAGNOSIS — M25521 Pain in right elbow: Secondary | ICD-10-CM | POA: Diagnosis not present

## 2019-05-29 DIAGNOSIS — R21 Rash and other nonspecific skin eruption: Secondary | ICD-10-CM | POA: Diagnosis not present

## 2019-05-29 DIAGNOSIS — L237 Allergic contact dermatitis due to plants, except food: Secondary | ICD-10-CM | POA: Diagnosis not present

## 2019-05-29 DIAGNOSIS — Z6829 Body mass index (BMI) 29.0-29.9, adult: Secondary | ICD-10-CM | POA: Diagnosis not present

## 2019-05-29 DIAGNOSIS — T63301A Toxic effect of unspecified spider venom, accidental (unintentional), initial encounter: Secondary | ICD-10-CM | POA: Diagnosis not present

## 2019-06-08 DIAGNOSIS — K219 Gastro-esophageal reflux disease without esophagitis: Secondary | ICD-10-CM | POA: Diagnosis not present

## 2019-06-08 DIAGNOSIS — T148XXD Other injury of unspecified body region, subsequent encounter: Secondary | ICD-10-CM | POA: Diagnosis not present

## 2019-06-08 DIAGNOSIS — R21 Rash and other nonspecific skin eruption: Secondary | ICD-10-CM | POA: Diagnosis not present

## 2019-07-13 ENCOUNTER — Ambulatory Visit (INDEPENDENT_AMBULATORY_CARE_PROVIDER_SITE_OTHER): Payer: BLUE CROSS/BLUE SHIELD | Admitting: Nurse Practitioner

## 2019-07-13 ENCOUNTER — Encounter (INDEPENDENT_AMBULATORY_CARE_PROVIDER_SITE_OTHER): Payer: Self-pay | Admitting: Nurse Practitioner

## 2019-07-13 ENCOUNTER — Other Ambulatory Visit: Payer: Self-pay

## 2019-07-13 VITALS — BP 126/78 | HR 59 | Temp 98.3°F | Ht 75.0 in | Wt 240.0 lb

## 2019-07-13 DIAGNOSIS — K5732 Diverticulitis of large intestine without perforation or abscess without bleeding: Secondary | ICD-10-CM

## 2019-07-13 DIAGNOSIS — K219 Gastro-esophageal reflux disease without esophagitis: Secondary | ICD-10-CM

## 2019-07-13 NOTE — Patient Instructions (Signed)
1. Continue taking a stool softener twice daily  2. Call our office if your lower abdominal pain recurs   3. You are due for another colonoscopy November 2021  4. Continue taking Esomeprazole 40mg  once daily for your acid reflux  5. Follow up in our office in 1 year and as needed

## 2019-07-13 NOTE — Progress Notes (Signed)
Subjective:    Patient ID: Elijah Shaffer, male    DOB: 1981/03/05, 38 y.o.   MRN: [id number]  HPI Elijah Shaffer is a 38 year old male with a past medical history significant for GERD and recurrent sigmoid diverticulitis confirmed by CT imaging on 06/28/2017, 07/24/2018, 10/14/2018 treated with Cipro an Flagyl.  He underwent a colonoscopy following his first episode of diverticulitis on 09/22/2017 which identified medium mouth diverticula throughout the entire colon, 2 tubular adenomatous polyps were removed from the transverse and ascending colon, 2 tubular adenomatous polyps were removed from the transverse colon and internal hemorrhoids noted. He was advised by Dr. Melony Overly to repeat a colonoscopy in 3 years.  At the time of his 3rd episode of diverticulitis 10/14/2018, an abdominal/pelvic CAT scan showed a masslike thickening to the region of the cecum with surrounding inflammatory changes and prominent but not enlarged ileocolic lymph nodes. He was treated with Cipro and Flagyl and a repeat abdominal/pelvic CT was done on 10/30/2018 which showed significant  improvement in the previously demonstrated cecal wall thickening, less likely to represent a malignancy with this improvement.  A repeat colonoscopy was not done at that time.  He presents today for his routine follow-up.  He denies having any current upper or lower abdominal pain.  He takes a stool softener twice daily to avoid constipation.  He is passing normal formed brown stool once or twice daily.  No rectal bleeding or melena.  He is taking is Meprazole 40 mg once daily which controls his reflux symptoms.  He stated his reflux symptoms started after his first episode of diverticulitis after taking antibiotics in 2018.  Denies NSAID use.   Maternal grandmother with history of colon cancer   Current Outpatient Medications on File Prior to Visit  Medication Sig Dispense Refill  . cetirizine (ZYRTEC) 10 MG tablet Take 10 mg by mouth daily.     [name] esomeprazole (NEXIUM) 40 MG capsule Take 40 mg by mouth daily at 12 noon.    [name] acetaminophen (TYLENOL) 500 MG tablet Take 500 mg by mouth every 6 (six) hours as needed.    . docusate sodium (COLACE) 100 MG capsule Take 100 mg by mouth 2 (two) times daily as needed for mild constipation.     No current facility-administered medications on file prior to visit.     Allergies  Allergen Reactions  . Claritin [Loratadine] Other (See Comments)    Heart race    Review of Systems see HPI, all other systems reviewed and are negative     Objective:   Physical Exam  BP 126/78   Pulse (!) 59   Temp 98.3 F (36.8 C)   Ht 6\' 3"  (1.905 m)   Wt 240 lb (108.9 kg)   BMI 30.00 kg/m   General: 38 year old male well-developed in no acute distress Eyes: Sclera nonicteric, conjunctiva pink Mouth: Few missing dentition, no ulcers Neck: Supple, no lymphadenopathy or thyromegaly Heart: Regular rate and rhythm, no murmurs Lungs: Breath sounds clear throughout Abdomen: Soft, nondistended, trace tenderness to the central lower abdomen without rebound or guarding, positive bowel sounds to all 4 quadrants, no HSM Extremities: No edema Neuro: Alert and oriented x4, no focal deficits     Assessment & Plan:   42.  38 year old male with a history of current sigmoid diverticulitis, last episode was 10/2018 -Discussed eating a healthy diet, drinking at least 8 glasses of water daily -Avoid constipation, continue stool softener 1 capsule twice daily, if constipation or  straining develops he will call the office for further instructions -Patient to call the office if lower abdominal pain develops  2.  History of tubular adenomatous polyp. + family hx of colon cancer -Next colonoscopy due November 2021, however, if he develops another episode of diverticulitis a colonoscopy would be done 8 to 10 weeks later  3.  Stable GERD -Continue as omeprazole 40 mg once daily  4.  Hepatic steatosis per CT imaging.   LFTs 10/14/2018 were normal -Gust healthy diet, weight loss Baldomero Lamy

## 2019-08-06 DIAGNOSIS — Z0001 Encounter for general adult medical examination with abnormal findings: Secondary | ICD-10-CM | POA: Diagnosis not present

## 2019-08-06 DIAGNOSIS — Z79899 Other long term (current) drug therapy: Secondary | ICD-10-CM | POA: Diagnosis not present

## 2019-08-06 DIAGNOSIS — Z125 Encounter for screening for malignant neoplasm of prostate: Secondary | ICD-10-CM | POA: Diagnosis not present

## 2019-08-10 DIAGNOSIS — Z0001 Encounter for general adult medical examination with abnormal findings: Secondary | ICD-10-CM | POA: Diagnosis not present

## 2019-08-10 DIAGNOSIS — J309 Allergic rhinitis, unspecified: Secondary | ICD-10-CM | POA: Diagnosis not present

## 2019-08-10 DIAGNOSIS — E559 Vitamin D deficiency, unspecified: Secondary | ICD-10-CM | POA: Diagnosis not present

## 2019-08-10 DIAGNOSIS — K219 Gastro-esophageal reflux disease without esophagitis: Secondary | ICD-10-CM | POA: Diagnosis not present

## 2019-08-10 DIAGNOSIS — E781 Pure hyperglyceridemia: Secondary | ICD-10-CM | POA: Diagnosis not present

## 2019-10-19 DIAGNOSIS — L0100 Impetigo, unspecified: Secondary | ICD-10-CM | POA: Diagnosis not present

## 2019-10-19 DIAGNOSIS — L280 Lichen simplex chronicus: Secondary | ICD-10-CM | POA: Diagnosis not present

## 2019-10-19 DIAGNOSIS — L0101 Non-bullous impetigo: Secondary | ICD-10-CM | POA: Diagnosis not present

## 2019-12-21 NOTE — Telephone Encounter (Signed)
This encounter was created in error - please disregard.

## 2020-02-11 ENCOUNTER — Ambulatory Visit
Admission: EM | Admit: 2020-02-11 | Discharge: 2020-02-11 | Disposition: A | Discharge status: Home / Self Care | Payer: 59 | Attending: Emergency Medicine | Admitting: Emergency Medicine

## 2020-02-11 DIAGNOSIS — L237 Allergic contact dermatitis due to plants, except food: Secondary | ICD-10-CM

## 2020-02-11 MED ORDER — METHYLPREDNISOLONE SODIUM SUCC 125 MG IJ SOLR
125.0000 mg | Freq: Once | INTRAMUSCULAR | Status: AC
  Administered 2020-02-11: 14:00:00 125 mg via INTRAMUSCULAR

## 2020-02-11 MED ORDER — PREDNISONE 20 MG PO TABS: 20.0000 mg | ORAL_TABLET | Freq: Two times a day (BID) | ORAL | 0 refills | Status: AC

## 2020-02-11 NOTE — Discharge Instructions (Signed)
Wash with warm water and mild soap Steroid shot given in office Prednisone prescribed.  Take as directed and to completion Use OTC zyrtec, allegra, or claritin during the day.  Benadryl at night. You may also use OTC hydrocortisone cream and/or calamine lotion to help alleviate itching Follow up with PCP if symptoms persists  Return or go to the ED if you have any new or worsening symptoms such as fever, chills, nausea, vomiting, difficulty breathing, throat swelling, tongue swelling, numbness/ tingling in mouth, worsening symptoms despite treatment, etc...  

## 2020-02-11 NOTE — ED Provider Notes (Signed)
[place]   [id number] 02/11/20 Arrival Time: 67  CC: Poison oak exposure  SUBJECTIVE:  Elijah Shaffer is a 39 y.o. male who presents with poison oak exposure a few days ago.  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  Localizes the rash to bilateral upper extremities.   Describes it as itchy, red, and spreading.  Has tried OTC allergy medication without relief.  Symptoms are made worse with scratching.  Reports similar symptoms in the past that improved with shot.   Denies fever, chills, nausea, vomiting, discharge, difficulty breathing, trouble swallowing, SOB, chest pain, abdominal pain, changes in bowel or bladder function.    ROS: As per HPI.  All other pertinent ROS negative.     Past Medical History:  Diagnosis Date  . Diverticulitis   . GERD (gastroesophageal reflux disease) 01/08/2018  . Seasonal allergies    Past Surgical History:  Procedure Laterality Date  . COLONOSCOPY N/A 09/22/2017   Procedure: COLONOSCOPY;  Surgeon: Rogene Houston, MD;  Location: [facility];  Service: Endoscopy;  Laterality: N/A;  9:55  . POLYPECTOMY  09/22/2017   Procedure: POLYPECTOMY;  Surgeon: Rogene Houston, MD;  Location: [facility];  Service: Endoscopy;;   Allergies  Allergen Reactions  . Claritin [Loratadine] Other (See Comments)    Heart race   No current facility-administered medications on file prior to encounter.   Current Outpatient Medications on File Prior to Encounter  Medication Sig Dispense Refill  . acetaminophen (TYLENOL) 500 MG tablet Take 500 mg by mouth every 6 (six) hours as needed.    . cetirizine (ZYRTEC) 10 MG tablet Take 10 mg by mouth daily.    [name] docusate sodium (COLACE) 100 MG capsule Take 100 mg by mouth 2 (two) times daily as needed for mild constipation.    [name] esomeprazole (NEXIUM) 40 MG capsule Take 40 mg by mouth daily at 12 noon.     Social History   Socioeconomic History  . Marital status:  Single    Spouse name: Not on file  . Number of children: Not on file  . Years of education: Not on file  . Highest education level: Not on file  Occupational History  . Not on file  Tobacco Use  . Smoking status: Never Smoker  . Smokeless tobacco: Never Used  Substance and Sexual Activity  . Alcohol use: Yes    Comment: occasional  . Drug use: No  . Sexual activity: Not on file  Other Topics Concern  . Not on file  Social History Narrative  . Not on file   Social Determinants of Health   Financial Resource Strain:   . Difficulty of Paying Living Expenses:   Food Insecurity:   . Worried About Charity fundraiser in the Last Year:   . Arboriculturist in the Last Year:   Transportation Needs:   . Film/video editor (Medical):   [name] Lack of Transportation (Non-Medical):   Physical Activity:   . Days of Exercise per Week:   . Minutes of Exercise per Session:   Stress:   . Feeling of Stress :   Social Connections:   . Frequency of Communication with Friends and Family:   . Frequency of Social Gatherings with Friends and Family:   . Attends Religious Services:   . Active Member of Clubs or Organizations:   . Attends Archivist Meetings:   [name] Marital Status:  Intimate Partner Violence:   . Fear of Current or Ex-Partner:   . Emotionally Abused:   [name] Physically Abused:   . Sexually Abused:    Family History  Problem Relation Age of Onset  . Colon cancer Maternal Grandmother   . Colon cancer Maternal Grandfather   . Healthy Mother   . Healthy Father     OBJECTIVE: Vitals:   02/11/20 1406  BP: (!) 148/94  Pulse: 94  Resp: 17  Temp: 99 F (37.2 C)  TempSrc: Oral  SpO2: 96%    General appearance: alert; no distress Head: NCAT Lungs: normal respiratory effort Skin: warm and dry; areas of linear papules and vesicles with surrounding erythema diffuse about bilateral forearms Neuro: Ambulates without difficulty Psychological: alert and cooperative;  normal mood and affect  ASSESSMENT & PLAN:  1. Contact dermatitis due to poison oak     Meds ordered this encounter  Medications  . predniSONE (DELTASONE) 20 MG tablet    Sig: Take 1 tablet (20 mg total) by mouth 2 (two) times daily with a meal for 5 days.    Dispense:  10 tablet    Refill:  0    Order Specific Question:   Supervising Provider    Answer:   Raylene Everts [id number]  . methylPREDNISolone sodium succinate (SOLU-MEDROL) 125 mg/2 mL injection 125 mg   Wash with warm water and mild soap Steroid shot given in office Prednisone prescribed.  Take as directed and to completion Use OTC zyrtec, allegra, or claritin during the day.  Benadryl at night. You may also use OTC hydrocortisone cream and/or calamine lotion to help alleviate itching Follow up with PCP if symptoms persists  Return or go to the ED if you have any new or worsening symptoms such as fever, chills, nausea, vomiting, difficulty breathing, throat swelling, tongue swelling, numbness/ tingling in mouth, worsening symptoms despite treatment, etc...  Reviewed expectations re: course of current medical issues. Questions answered. Outlined signs and symptoms indicating need for more acute intervention. Patient verbalized understanding. After Visit Summary given.   Lestine Box, PA-C 02/11/20 1423

## 2020-02-11 NOTE — ED Triage Notes (Signed)
Pt presents with complaints of getting in contact with poison oak or ivy the other day. Reports he now has a rash on his arms and itchy eyes. Reports taking an allergy relief medication and a cream to the rash with no relief of symptoms.

## 2020-05-24 ENCOUNTER — Telehealth (INDEPENDENT_AMBULATORY_CARE_PROVIDER_SITE_OTHER): Payer: Self-pay | Admitting: Gastroenterology

## 2020-05-24 NOTE — Telephone Encounter (Signed)
Patient called stated he is having a diverticulitis flare - scheduled for an office visit on 07/12/20 - please advise - [phone]

## 2020-05-24 NOTE — Telephone Encounter (Signed)
I can see patient tomorrow - okay to add onto afternoon schedule or at 11:45am. Recommend very bland/soft diet until his appt. Thanks.

## 2020-05-25 ENCOUNTER — Other Ambulatory Visit: Payer: Self-pay

## 2020-05-25 ENCOUNTER — Encounter (INDEPENDENT_AMBULATORY_CARE_PROVIDER_SITE_OTHER): Payer: Self-pay | Admitting: Gastroenterology

## 2020-05-25 ENCOUNTER — Ambulatory Visit (INDEPENDENT_AMBULATORY_CARE_PROVIDER_SITE_OTHER): Payer: 59 | Admitting: Gastroenterology

## 2020-05-25 VITALS — BP 132/88 | HR 67 | Temp 97.9°F | Ht 75.0 in | Wt 239.0 lb

## 2020-05-25 DIAGNOSIS — R1030 Lower abdominal pain, unspecified: Secondary | ICD-10-CM | POA: Diagnosis not present

## 2020-05-25 MED ORDER — METRONIDAZOLE 500 MG PO TABS: 500.0000 mg | ORAL_TABLET | Freq: Three times a day (TID) | ORAL | 0 refills | Status: DC

## 2020-05-25 MED ORDER — CIPROFLOXACIN HCL 500 MG PO TABS: 500.0000 mg | ORAL_TABLET | Freq: Two times a day (BID) | ORAL | 0 refills | Status: DC

## 2020-05-25 NOTE — Progress Notes (Signed)
Patient profile: Elijah Shaffer is a 39 y.o. male seen for evaluation of abd pain. Last seen in clinic 07/2019, last episode of diverticulitis was 10/2018  History of Present Illness: Elijah Shaffer is seen today he reports about 4 days ago developing lower mid abdominal pain.  Also associated with constipation, is passing a small amount of stool but often feels completely empty at times.  Pain is persisted intermittently over the past few days.  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  Feels like pain seems similar to prior diverticulitis flares.  He denies any nausea vomiting with the pain.  He has had some worsening GERD symptoms with the diverticulitis-like pain.  He takes Nexium on an as-needed basis but has restarted for GERD.  Denies any dysphagia.  Feels he is getting adequate fluid intake.  Non smoker. No alcohol.   Wt Readings from Last 3 Encounters:  05/25/20 239 lb (108.4 kg)  07/13/19 240 lb (108.9 kg)  10/14/18 237 lb 11.2 oz (107.8 kg)     Last Colonoscopy: - Two 4 to 6 mm polyps in the transverse colon and in the ascending colon, removed with a cold snare. Resected and retrieved. - Two 7 to 8 mm polyps in the transverse colon. Clips (MR conditional) were placed. - Diverticulosis in the entire examined colon. - Internal hemorrhoids.   All polyps are tubular adenomas. Next colonoscopy in 3 years.      Past Medical History:  Past Medical History:  Diagnosis Date  . Diverticulitis   . GERD (gastroesophageal reflux disease) 01/08/2018  . Seasonal allergies     Problem List: Patient Active Problem List   Diagnosis Date Noted  . GERD (gastroesophageal reflux disease) 01/08/2018  . Diverticulitis of colon 09/15/2017  . Sigmoid diverticulitis   . Acute renal injury ([place]) 06/28/2017    Past Surgical History: Past Surgical History:  Procedure Laterality Date  . COLONOSCOPY N/A 09/22/2017   Procedure:  COLONOSCOPY;  Surgeon: Rogene Houston, MD;  [facility]: [facility];  Service: Endoscopy;  Laterality: N/A;  9:55  . POLYPECTOMY  09/22/2017   Procedure: POLYPECTOMY;  Surgeon: Rogene Houston, MD;  Location: [facility];  Service: Endoscopy;;    Allergies: Allergies  Allergen Reactions  . Claritin [Loratadine] Other (See Comments)    Heart race      Home Medications:  Current Outpatient Medications:  .  acetaminophen (TYLENOL) 500 MG tablet, Take 500 mg by mouth every 6 (six) hours as needed., Disp: , Rfl:  .  cetirizine (ZYRTEC) 10 MG tablet, Take 10 mg by mouth daily., Disp: , Rfl:  .  docusate sodium (COLACE) 100 MG capsule, Take 100 mg by mouth 2 (two) times daily as needed for mild constipation., Disp: , Rfl:  .  esomeprazole (NEXIUM) 40 MG capsule, Take 40 mg by mouth daily at 12 noon., Disp: , Rfl:  .  triamcinolone ointment (KENALOG) 0.5 %, Apply 1 application topically 2 (two) times daily. , Disp: , Rfl:  .  ciprofloxacin (CIPRO) 500 MG tablet, Take 1 tablet (500 mg total) by mouth 2 (two) times daily., Disp: 20 tablet, Rfl: 0 .  metroNIDAZOLE (FLAGYL) 500 MG tablet, Take 1 tablet (500 mg total) by mouth 3 (three) times daily., Disp: 30 tablet, Rfl: 0   Family History: family history includes Colon cancer in his maternal grandfather and maternal grandmother; Healthy in his father and mother.    Social History:  reports that he has never smoked. He has never used smokeless tobacco. He reports current alcohol use. He reports that he does not use drugs.   Review of Systems: Constitutional: Denies weight loss/weight gain  Eyes: No changes in vision. ENT: No oral lesions, sore throat.  GI: see HPI.  Heme/Lymph: No easy bruising.  CV: No chest pain.  GU: No hematuria.  Integumentary: No rashes.  Neuro: No headaches.  Psych: No depression/anxiety.  Endocrine: No heat/cold intolerance.  Allergic/Immunologic: No urticaria.  Resp: No cough, SOB.  Musculoskeletal: No  joint swelling.    Physical Examination: BP 132/88 (BP Location: Right Arm, Patient Position: Sitting, Cuff Size: Large)   Pulse 67   Temp 97.9 F (36.6 C) (Oral)   Ht 6\' 3"  (1.905 m)   Wt 239 lb (108.4 kg)   BMI 29.87 kg/m  Gen: NAD, alert and oriented x 4 HEENT: PEERLA, EOMI, Neck: supple, no JVD Chest: CTA bilaterally, no wheezes, crackles, or other adventitious sounds CV: RRR, no m/g/c/r Abd: soft, tender to palpation bilateral lower abdomen, ND, +BS in all four quadrants; no HSM, guarding, ridigity, or rebound tenderness Ext: no edema, well perfused with 2+ pulses, Skin: no rash or lesions noted on observed skin Lymph: no noted LAD  Data Reviewed:  CT a/p 10/30/18-IMPRESSION: 1. Significant improvement in the previously demonstrated cecal wall thickening and resolved adjacent inflammatory changes. The residual wall thickening is less likely to represent malignancy since it has improved significantly without residual nodularity. Colonoscopy would be useful for excluding underlying neoplasm. 2. Extensive colonic diverticulosis. 3. Stable moderate-sized hiatal hernia. 4. 5 mm faint oval area of low density in the inferior aspect of the right lobe of the liver. This is nonspecific and has not changed significantly since 10/14/2018, not seen prior to that time. This may represent a small cyst or hemangioma.  CT a/p 10/2018-IMPRESSION: 1. Mass-like thickening in the region of the cecum with surrounding inflammatory changes and some adjacent prominent but nonenlarged ileocolic lymph nodes. Although this could be infectious or inflammatory in etiology (e.g., right-sided diverticulitis), the possibility of underlying cecal neoplasm should be considered and further evaluation with nonemergent colonoscopy is recommended in the near future to better evaluate this finding. Notably, both the appendix and the terminal ileum appear grossly normal.  07/2018-IMPRESSION: 1. Acute sigmoid  diverticulitis. Focus of air in the region of inflammation is felt to be within the inflamed diverticulum rather than micro perforation. No abscess. 2. Hepatic steatosis. 3. Moderate hiatal hernia.  Assessment/Plan: Mr. Elijah Shaffer is a 39 y.o. male seen for evaluation of lower abdominal pain with history of diverticulitis.  Lower abdominal pain-symptoms seem similar to prior episodes and will treat empirically with Cipro Flagyl.  He does not drink alcohol.  He does have some chronic constipation, we discussed long-term getting adequate fiber as well as MiraLAX to prevent constipation.  He will contact me if he is not doing better with Cipro Flagyl in a couple days. He is due for a colonoscopy 09/2020 for f/up of colon polyps.   Gerd - nexium refilled. No UGI alarm symptoms.   Pattrick was seen today for follow-up.  Diagnoses and all orders for this visit:  Lower abdominal pain  Other orders -     ciprofloxacin (CIPRO) 500 MG tablet; Take 1 tablet (500 mg total) by mouth 2 (two) times daily. -     metroNIDAZOLE (FLAGYL) 500 MG tablet; Take 1 tablet (500 mg total) by mouth 3 (three) times daily.  I personally performed the service, non-incident to. (WP)  Laurine Blazer, [facility]

## 2020-05-25 NOTE — Patient Instructions (Signed)
Try Cipro (2x/day) and Flagyl (3x/day). Plenty of liquids and soft foods. Avoid salads, meats, etc until pain improves.   Long term - try miralax (can start with half capful of powder and increase as needed) with goal of soft bowel movement daily.

## 2020-07-12 ENCOUNTER — Ambulatory Visit (INDEPENDENT_AMBULATORY_CARE_PROVIDER_SITE_OTHER): Payer: 59 | Admitting: Gastroenterology

## 2020-07-12 ENCOUNTER — Other Ambulatory Visit: Payer: Self-pay

## 2020-07-12 ENCOUNTER — Encounter (INDEPENDENT_AMBULATORY_CARE_PROVIDER_SITE_OTHER): Payer: Self-pay | Admitting: Gastroenterology

## 2020-07-12 ENCOUNTER — Encounter (INDEPENDENT_AMBULATORY_CARE_PROVIDER_SITE_OTHER): Payer: Self-pay | Admitting: *Deleted

## 2020-07-12 VITALS — BP 139/89 | HR 67 | Temp 98.7°F | Ht 75.0 in | Wt 237.9 lb

## 2020-07-12 DIAGNOSIS — Z86010 Personal history of colonic polyps: Secondary | ICD-10-CM

## 2020-07-12 DIAGNOSIS — Z8719 Personal history of other diseases of the digestive system: Secondary | ICD-10-CM

## 2020-07-12 NOTE — Patient Instructions (Signed)
We are scheduling a colonoscopy for evaluation.  Please continue to make sure you are getting adequate fiber and fluid in your diet.  You can use Pepcid as needed for acid reflux.  Orange juice may make this worse.  Please notify me if you have any new GI symptoms.

## 2020-07-12 NOTE — Progress Notes (Signed)
Patient profile: Elijah Shaffer is a 39 y.o. male seen for follow up of diverticulitis.   History of Present Illness: Elijah Shaffer is seen today for Follow-up.  He reports overall doing well.  Diverticulitis-like symptoms resolved after last visit taking Cipro and Flagyl.  Continues to do well as long as avoiding trigger foods such as foods with seeds. Strawberries and sesame seed buns can be worst triggers.  Taking stool softener twice a day-typically having daily bowel movement without straining.  Denies any constipation, diarrhea, melena, rectal bleeding.  No nausea, vomiting, epigastric pain, dysphagia.  Occasional GERD symptoms despite Nexium, has Pepcid in the past with good results on the basis. does eat quite a bit of fast food which aggrevates symptoms.   Non smoker. Infrequent alcohol. Denies nsaids.    Wt Readings from Last 3 Encounters:  07/12/20 237 lb 14.4 oz (107.9 kg)  05/25/20 239 lb (108.4 kg)  07/13/19 240 lb (108.9 kg)     Last Colonoscopy: 09/2017-- Two 4 to 6 mm polyps in the transverse colon and in the ascending colon, removed with a cold snare. Resected and retrieved. - Two 7 to 8 mm polyps in the transverse colon. Clips (MR conditional) were placed. - Diverticulosis in the entire examined colon. - Internal hemorrhoids.  1. Colon, polyp(s), transverse - TUBULAR ADENOMA (5 OF 5 FRAGMENTS) - NO HIGH GRADE DYSPLASIA OR MALIGNANCY IDENTIFIED 2. Colon, polyp(s), ascending - TUBULAR ADENOMA (4 OF 4 FRAGMENTS) - NO HIGH GRADE DYSPLASIA OR MALIGNANCY IDENTIFIED Next colonoscopy in 3 years.       Past Medical History:  Past Medical History:  Diagnosis Date  . Diverticulitis   . GERD (gastroesophageal reflux disease) 01/08/2018  . Seasonal allergies     Problem List: Patient Active Problem List   Diagnosis Date Noted  . GERD (gastroesophageal reflux disease) 01/08/2018  . Diverticulitis of colon 09/15/2017  . Sigmoid diverticulitis   . Acute renal  injury ([place]) 06/28/2017    Past Surgical History: Past Surgical History:  Procedure Laterality Date  . COLONOSCOPY N/A 09/22/2017   Procedure: COLONOSCOPY;  Surgeon: Rogene Houston, MD;  [facility];  Service: Endoscopy;  Laterality: N/A;  9:55  . POLYPECTOMY  09/22/2017   Procedure: POLYPECTOMY;  Surgeon: Rogene Houston, MD;  [facility]: [facility];  Service: Endoscopy;;    Allergies: Allergies  Allergen Reactions  . Claritin [Loratadine] Other (See Comments)    Heart race      Home Medications:  Current Outpatient Medications:  .  acetaminophen (TYLENOL) 500 MG tablet, Take 500 mg by mouth every 6 (six) hours as needed., Disp: , Rfl:  .  cetirizine (ZYRTEC) 10 MG tablet, Take 10 mg by mouth daily., Disp: , Rfl:  .  docusate sodium (COLACE) 100 MG capsule, Take 100 mg by mouth 2 (two) times daily as needed for mild constipation., Disp: , Rfl:  .  esomeprazole (NEXIUM) 40 MG capsule, Take 40 mg by mouth daily at 12 noon., Disp: , Rfl:  .  triamcinolone ointment (KENALOG) 0.5 %, Apply 1 application topically 2 (two) times daily. , Disp: , Rfl:    Family History: family history includes Colon cancer in his maternal grandfather and maternal grandmother; Healthy in his father and mother.    Social History:   reports that he has never smoked. He has never used smokeless tobacco. He reports current alcohol use. He reports that he does not use drugs.   Review of Systems: Constitutional: Denies  weight loss/weight gain  Eyes: No changes in vision. ENT: No oral lesions, sore throat.  GI: see HPI.  Heme/Lymph: No easy bruising.  CV: No chest pain.  GU: No hematuria.  Integumentary: No rashes.  Neuro: No headaches.  Psych: No depression/anxiety.  Endocrine: No heat/cold intolerance.  Allergic/Immunologic: No urticaria.  Resp: No cough, SOB.  Musculoskeletal: No joint swelling.    Physical Examination: BP 139/89 (BP Location: Right Arm, Patient Position: Sitting,  Cuff Size: Large)   Pulse 67   Temp 98.7 F (37.1 C) (Oral)   Ht 6\' 3"  (1.905 m)   Wt 237 lb 14.4 oz (107.9 kg)   BMI 29.74 kg/m  Gen: NAD, alert and oriented x 4 HEENT: PEERLA, EOMI, Neck: supple, no JVD Chest: CTA bilaterally, no wheezes, crackles, or other adventitious sounds CV: RRR, no m/g/c/r Abd: soft, NT, ND, +BS in all four quadrants; no HSM, guarding, ridigity, or rebound tenderness Ext: no edema, well perfused with 2+ pulses, Skin: no rash or lesions noted on observed skin Lymph: no noted LAD  Data Reviewed: No labs available for review in epic  10/2018-IMPRESSION: ct a/p  1. Significant improvement in the previously demonstrated cecal wall thickening and resolved adjacent inflammatory changes. The residual wall thickening is less likely to represent malignancy since it has improved significantly without residual nodularity. Colonoscopy would be useful for excluding underlying neoplasm. 2. Extensive colonic diverticulosis. 3. Stable moderate-sized hiatal hernia. 4. 5 mm faint oval area of low density in the inferior aspect of the right lobe of the liver. This is nonspecific and has not changed significantly since 10/14/2018, not seen prior to that time. This may represent a small cyst or hemangioma.  Assessment/Plan: Elijah Shaffer is a 38 y.o. male seen for f/up.   1.  History of diverticulitis-resolved after ABX treatment 05/2020. We discussed adequate fiber fluid and avoiding constipation.  We will contact with any return of diverticulitis-like symptoms. Seeds clearly trigger episodes and he avoids these.   2.  History of colon polyps-due for repeat colonoscopy November 2021 was scheduled today.  3. GERD-continue PPI. Diet discussed. Okay to used pepcid PRN which has helped symptoms in past.   Patient denies CP, SOB, and use of blood thinners. I discussed the risks and benefits of procedure including bleeding, perforation, infection, missed lesions, medication  reactions and possible hospitalization or surgery if complications. All questions answered.  Denies prior issues with sedation.  Trevian was seen today for follow-up.  Diagnoses and all orders for this visit:  Personal history of colonic polyps  History of diverticulitis     I personally performed the service, non-incident to. (WP)  Laurine Blazer, [facility]

## 2020-09-05 ENCOUNTER — Encounter (INDEPENDENT_AMBULATORY_CARE_PROVIDER_SITE_OTHER): Payer: Self-pay | Admitting: *Deleted

## 2020-09-19 ENCOUNTER — Other Ambulatory Visit (HOSPITAL_COMMUNITY): Payer: 59

## 2020-10-20 ENCOUNTER — Encounter: Payer: Self-pay | Admitting: Orthopedic Surgery

## 2020-10-20 ENCOUNTER — Ambulatory Visit: Payer: 59

## 2020-10-20 ENCOUNTER — Ambulatory Visit (INDEPENDENT_AMBULATORY_CARE_PROVIDER_SITE_OTHER): Payer: 59 | Admitting: Orthopedic Surgery

## 2020-10-20 ENCOUNTER — Other Ambulatory Visit: Payer: Self-pay

## 2020-10-20 VITALS — BP 153/107 | HR 81 | Ht 75.0 in | Wt 245.0 lb

## 2020-10-20 DIAGNOSIS — M2142 Flat foot [pes planus] (acquired), left foot: Secondary | ICD-10-CM

## 2020-10-20 DIAGNOSIS — M2141 Flat foot [pes planus] (acquired), right foot: Secondary | ICD-10-CM

## 2020-10-20 DIAGNOSIS — M79671 Pain in right foot: Secondary | ICD-10-CM | POA: Diagnosis not present

## 2020-10-20 DIAGNOSIS — M722 Plantar fascial fibromatosis: Secondary | ICD-10-CM | POA: Diagnosis not present

## 2020-10-20 DIAGNOSIS — M79672 Pain in left foot: Secondary | ICD-10-CM

## 2020-10-20 DIAGNOSIS — Q688 Other specified congenital musculoskeletal deformities: Secondary | ICD-10-CM

## 2020-10-20 MED ORDER — DICLOFENAC SODIUM 1 % EX GEL: 2.0000 g | Freq: Four times a day (QID) | CUTANEOUS | 2 refills | Status: AC

## 2020-10-20 NOTE — Progress Notes (Signed)
New Patient Visit  Assessment: Elijah Shaffer is a 39 y.o. male with the following: 1.  Bilateral pes planus; painful, R>L 2.  Bilateral os trigonum 3.  Right plantar fasciitis   Plan: Mr. Bifulco has a number of complaints in bilateral feet.  Currently, his right foot is bothering him more than his left.  Based on radiographs, he has bilateral os trigonum, but these appear to be symptomatic because he has bilateral pes planus, right is more advanced than the left.  He is currently having symptoms consistent with plantar fasciitis on the right as well.  We discussed a number of potential treatment options including modifying footwear (work boots), medications and custom orthotics.  I stressed to him that there is no quick fix, and because he is on his feet all day, his choice of footwear is paramount.  He also needs to work on stretching his bilateral heel cords as these are very tight.  I recommended a trial of voltaren topical gel, as he cannot take NSAIDs secondary to diverticulitis.  He was also given a prescription for custom orthotics.  No follow up scheduled, he will contact the clinic with further issues.   Follow-up: Return if symptoms worsen or fail to improve.  Subjective:  Chief Complaint  Patient presents with  . Ankle Pain    Bilateral ankle pain, no injury.     History of Present Illness: Elijah Shaffer is a 39 y.o. male who presents for bilateral ankle pain, right is currently worse than left.  He localizes his pain to the medial ankle, radiating distally.  He has always had flat feet, and occasional discomfort in his ankles and medial feet, however his pain has progressively worsened and causing him a lot of discomfort.  He has previously tried insoles in his work boots, but these are not currently helping.  No previous injuries to his feet.  He is taking tylenol right now.  He cannot take NSAIDs.  He is also having pain in his right heel which is most severe in the morning.   Occasionally, the pain presents as a burning sensation, radiating distally.  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  Due to the size of his feet, he has difficulty finding supportive work boots.    Review of Systems: No fevers or chills No numbness or tingling No chest pain No shortness of breath No bowel or bladder dysfunction No GI distress No headaches   Medical History:  Past Medical History:  Diagnosis Date  . Diverticulitis   . GERD (gastroesophageal reflux disease) 01/08/2018  . Seasonal allergies     Past Surgical History:  Procedure Laterality Date  . COLONOSCOPY N/A 09/22/2017   Procedure: COLONOSCOPY;  Surgeon: Rogene Houston, MD;  [facility];  Service: Endoscopy;  Laterality: N/A;  9:55  . POLYPECTOMY  09/22/2017   Procedure: POLYPECTOMY;  Surgeon: Rogene Houston, MD;  [facility];  Service: Endoscopy;;    Family History  Problem Relation Age of Onset  . Colon cancer Maternal Grandmother   . Colon cancer Maternal Grandfather   . Healthy Mother   . Healthy Father    Social History   Tobacco Use  . Smoking status: Never Smoker  . Smokeless tobacco: Never Used  Vaping Use  . Vaping Use: Never used  Substance Use Topics  . Alcohol use: Yes    Comment: occasional  . Drug use: No  Allergies  Allergen Reactions  . Claritin [Loratadine] Other (See Comments)    Heart race    Current Meds  Medication Sig  . acetaminophen (TYLENOL) 500 MG tablet Take 500 mg by mouth every 6 (six) hours as needed.  . cetirizine (ZYRTEC) 10 MG tablet Take 10 mg by mouth daily.  [name] docusate sodium (COLACE) 100 MG capsule Take 100 mg by mouth 2 (two) times daily as needed for mild constipation.  [name] esomeprazole (NEXIUM) 40 MG capsule Take 40 mg by mouth daily at 12 noon.  . triamcinolone ointment (KENALOG) 0.5 % Apply 1 application topically 2 (two) times daily.     Objective: BP (!) 153/107   Pulse 81   Ht 6\' 3"  (1.905  m)   Wt 245 lb (111.1 kg)   BMI 30.62 kg/m   Physical Exam:  General: Alert and oriented, no acute distress. Gait: Normal  Bilateral flat feet.  Positive too many toes sign, R>L.  TTP medial ankle, extending posteriorly.  TTP right heel.  Tight Achilles, less than 5 degrees dorsiflexion with knee extended.  Toes are warm and well perfused.  Sensation intact distally.  No lesions or rashes.     IMAGING: I personally ordered and reviewed the following images   Bilateral XR of his feet were obtained in clinic today. No acute abnormalities.  He has bilateral os trigonum as well as pes planus.   Impression: Bilateral pes planus with os trigonum.    New Medications:  Meds ordered this encounter  Medications  . diclofenac Sodium (VOLTAREN) 1 % GEL    Sig: Apply 2 g topically 4 (four) times daily.    Dispense:  150 g    Refill:  2      Mordecai Rasmussen, MD  10/20/2020 9:58 PM

## 2020-10-20 NOTE — Patient Instructions (Signed)
1.  Heel cord stretching - good exercises available online, or through YouTube 2.  Voltaren topical gel prescription sent to pharmacy 3.  Prescription for custom made orthotics, give these orthotics a chance for several months 4.  Consider new footwear to provide increased stability

## 2020-10-23 ENCOUNTER — Telehealth: Payer: Self-pay | Admitting: Orthopedic Surgery

## 2020-10-23 NOTE — Telephone Encounter (Signed)
Yes please - Amy filled out the prescription before.  I can come by the office to sign it once it is ready.   Delroy Ordway A. Amedeo Kinsman, MD [facility] [address] [place],    [address] Phone: [phone] Fax: [phone]

## 2020-10-23 NOTE — Telephone Encounter (Signed)
Patient called regarding orthotic order given at time of visit on Friday; states it did not have his name on the order. States due to immediate need for the orthotics, he went on to the [facility]. Said he is pleased with orthotics received there; also states he will need to present a copy of the order for submitting to his insurance for possible reimbursement.  Please advise patient as to when he can pick up order. (Relayed Dr Amedeo Kinsman is out of clinic today and tomorrow).  Patient also said he will ask his chiropractor to send a request for copy of his visit notes. Relayed we can release upon receiving their authorized request.

## 2020-10-23 NOTE — Telephone Encounter (Signed)
I called the patient to let him know that the doctor had signed the form and he reports that his mom will pick up the form tomorrow morning.  No other concerns.

## 2020-10-23 NOTE — Telephone Encounter (Signed)
Dr. Amedeo Kinsman is going to come by and sign the updated prescription for this patient and I will route the message for one of the up front staff to call the patient and sign the updated paper prescription.

## 2020-10-23 NOTE — Telephone Encounter (Signed)
Would you like to fill out a new prescription pad for this patient? Please advise.

## 2020-11-14 ENCOUNTER — Other Ambulatory Visit (HOSPITAL_COMMUNITY)
Admission: RE | Admit: 2020-11-14 | Discharge: 2020-11-14 | Disposition: A | Discharge status: Home / Self Care | Payer: 59 | Source: Ambulatory Visit | Attending: Internal Medicine | Admitting: Internal Medicine

## 2020-11-16 ENCOUNTER — Encounter (HOSPITAL_COMMUNITY): Admission: RE | Payer: Self-pay | Source: Home / Self Care

## 2020-11-16 ENCOUNTER — Ambulatory Visit (HOSPITAL_COMMUNITY): Admission: RE | Admit: 2020-11-16 | Payer: 59 | Source: Home / Self Care | Admitting: Internal Medicine

## 2020-11-16 SURGERY — COLONOSCOPY
Anesthesia: Moderate Sedation

## 2021-02-23 ENCOUNTER — Emergency Department (HOSPITAL_COMMUNITY): Payer: 59

## 2021-02-23 ENCOUNTER — Encounter (HOSPITAL_COMMUNITY): Payer: Self-pay | Admitting: Emergency Medicine

## 2021-02-23 ENCOUNTER — Observation Stay (HOSPITAL_COMMUNITY): Payer: 59

## 2021-02-23 ENCOUNTER — Other Ambulatory Visit: Payer: Self-pay

## 2021-02-23 ENCOUNTER — Observation Stay (HOSPITAL_COMMUNITY)
Admission: EM | Admit: 2021-02-23 | Discharge: 2021-02-25 | Disposition: A | Discharge status: Home / Self Care | Payer: 59 | Attending: Family Medicine | Admitting: Family Medicine

## 2021-02-23 DIAGNOSIS — N179 Acute kidney failure, unspecified: Secondary | ICD-10-CM | POA: Diagnosis not present

## 2021-02-23 DIAGNOSIS — R29818 Other symptoms and signs involving the nervous system: Secondary | ICD-10-CM | POA: Diagnosis not present

## 2021-02-23 DIAGNOSIS — G459 Transient cerebral ischemic attack, unspecified: Secondary | ICD-10-CM

## 2021-02-23 DIAGNOSIS — E876 Hypokalemia: Secondary | ICD-10-CM | POA: Diagnosis not present

## 2021-02-23 DIAGNOSIS — Z79899 Other long term (current) drug therapy: Secondary | ICD-10-CM | POA: Diagnosis not present

## 2021-02-23 DIAGNOSIS — K5792 Diverticulitis of intestine, part unspecified, without perforation or abscess without bleeding: Secondary | ICD-10-CM | POA: Insufficient documentation

## 2021-02-23 DIAGNOSIS — Z20822 Contact with and (suspected) exposure to covid-19: Secondary | ICD-10-CM | POA: Diagnosis not present

## 2021-02-23 DIAGNOSIS — K219 Gastro-esophageal reflux disease without esophagitis: Secondary | ICD-10-CM | POA: Diagnosis present

## 2021-02-23 DIAGNOSIS — F419 Anxiety disorder, unspecified: Secondary | ICD-10-CM | POA: Insufficient documentation

## 2021-02-23 DIAGNOSIS — I10 Essential (primary) hypertension: Secondary | ICD-10-CM | POA: Insufficient documentation

## 2021-02-23 DIAGNOSIS — R1084 Generalized abdominal pain: Principal | ICD-10-CM | POA: Insufficient documentation

## 2021-02-23 DIAGNOSIS — R112 Nausea with vomiting, unspecified: Secondary | ICD-10-CM | POA: Insufficient documentation

## 2021-02-23 DIAGNOSIS — K5732 Diverticulitis of large intestine without perforation or abscess without bleeding: Secondary | ICD-10-CM | POA: Diagnosis present

## 2021-02-23 LAB — RAPID URINE DRUG SCREEN, HOSP PERFORMED
Amphetamines: NOT DETECTED
Barbiturates: NOT DETECTED
Benzodiazepines: POSITIVE — AB
Cocaine: NOT DETECTED
Opiates: NOT DETECTED
Tetrahydrocannabinol: NOT DETECTED

## 2021-02-23 LAB — COMPREHENSIVE METABOLIC PANEL
ALT: 20 U/L (ref 0–44)
AST: 19 U/L (ref 15–41)
Albumin: 4.9 g/dL (ref 3.5–5.0)
Alkaline Phosphatase: 60 U/L (ref 38–126)
Anion gap: 10 (ref 5–15)
BUN: 13 mg/dL (ref 6–20)
CO2: 22 mmol/L (ref 22–32)
Calcium: 9.6 mg/dL (ref 8.9–10.3)
Chloride: 106 mmol/L (ref 98–111)
Creatinine, Ser: 1.4 mg/dL — ABNORMAL HIGH (ref 0.61–1.24)
GFR, Estimated: >60 mL/min (ref >60)
Glucose, Bld: 80 mg/dL (ref 70–99)
Potassium: 3.3 mmol/L — ABNORMAL LOW (ref 3.5–5.1)
Sodium: 138 mmol/L (ref 135–145)
Total Bilirubin: 1.2 mg/dL (ref 0.3–1.2)
Total Protein: 7.9 g/dL (ref 6.5–8.1)

## 2021-02-23 LAB — I-STAT CHEM 8, ED
BUN: 12 mg/dL (ref 6–20)
Calcium, Ion: 1.22 mmol/L (ref 1.15–1.40)
Chloride: 106 mmol/L (ref 98–111)
Creatinine, Ser: 1.5 mg/dL — ABNORMAL HIGH (ref 0.61–1.24)
Glucose, Bld: 79 mg/dL (ref 70–99)
HCT: 49 % (ref 39.0–52.0)
Hemoglobin: 16.7 g/dL (ref 13.0–17.0)
Potassium: 3.4 mmol/L — ABNORMAL LOW (ref 3.5–5.1)
Sodium: 143 mmol/L (ref 135–145)
TCO2: 22 mmol/L (ref 22–32)

## 2021-02-23 LAB — PROTIME-INR
INR: 1 (ref 0.8–1.2)
Prothrombin Time: 13.6 seconds (ref 11.4–15.2)

## 2021-02-23 LAB — RESP PANEL BY RT-PCR (FLU A&B, COVID) ARPGX2
Influenza A by PCR: NEGATIVE
Influenza B by PCR: NEGATIVE
SARS Coronavirus 2 by RT PCR: NEGATIVE

## 2021-02-23 LAB — URINALYSIS, ROUTINE W REFLEX MICROSCOPIC
Bacteria, UA: NONE SEEN
Bilirubin Urine: NEGATIVE
Glucose, UA: NEGATIVE mg/dL
Hgb urine dipstick: NEGATIVE
Ketones, ur: 5 mg/dL — AB
Leukocytes,Ua: NEGATIVE
Nitrite: NEGATIVE
Protein, ur: 30 mg/dL — AB
Specific Gravity, Urine: 1.044 — ABNORMAL HIGH (ref 1.005–1.030)
pH: 6 (ref 5.0–8.0)

## 2021-02-23 LAB — CK: Total CK: 160 U/L (ref 49–397)

## 2021-02-23 LAB — CBG MONITORING, ED: Glucose-Capillary: 77 mg/dL (ref 70–99)

## 2021-02-23 LAB — DIFFERENTIAL
Abs Immature Granulocytes: 0.04 10*3/uL (ref 0.00–0.07)
Basophils Absolute: 0.1 10*3/uL (ref 0.0–0.1)
Basophils Relative: 0 %
Eosinophils Absolute: 0 10*3/uL (ref 0.0–0.5)
Eosinophils Relative: 0 %
Immature Granulocytes: 0 %
Lymphocytes Relative: 27 %
Lymphs Abs: 3.4 10*3/uL (ref 0.7–4.0)
Monocytes Absolute: 1 10*3/uL (ref 0.1–1.0)
Monocytes Relative: 8 %
Neutro Abs: 8.2 10*3/uL — ABNORMAL HIGH (ref 1.7–7.7)
Neutrophils Relative %: 65 %

## 2021-02-23 LAB — ETHANOL: Alcohol, Ethyl (B): <10 mg/dL (ref <10)

## 2021-02-23 LAB — CBC
HCT: 48.9 % (ref 39.0–52.0)
Hemoglobin: 16.7 g/dL (ref 13.0–17.0)
MCH: 30.5 pg (ref 26.0–34.0)
MCHC: 34.2 g/dL (ref 30.0–36.0)
MCV: 89.2 fL (ref 80.0–100.0)
Platelets: 280 10*3/uL (ref 150–400)
RBC: 5.48 MIL/uL (ref 4.22–5.81)
RDW: 12.8 % (ref 11.5–15.5)
WBC: 12.7 10*3/uL — ABNORMAL HIGH (ref 4.0–10.5)
nRBC: 0 % (ref 0.0–0.2)

## 2021-02-23 LAB — APTT: aPTT: 25 seconds (ref 24–36)

## 2021-02-23 LAB — LIPASE, BLOOD: Lipase: 38 U/L (ref 11–51)

## 2021-02-23 MED ORDER — ACETAMINOPHEN 160 MG/5ML PO SOLN: 650.0000 mg | ORAL | Status: DC | PRN

## 2021-02-23 MED ORDER — SODIUM CHLORIDE 0.9 % IV BOLUS
250.0000 mL | Freq: Once | INTRAVENOUS | Status: AC
  Administered 2021-02-23: 250 mL via INTRAVENOUS

## 2021-02-23 MED ORDER — IOHEXOL 300 MG/ML  SOLN
100.0000 mL | Freq: Once | INTRAMUSCULAR | Status: AC | PRN
  Administered 2021-02-23: 100 mL via INTRAVENOUS

## 2021-02-23 MED ORDER — POTASSIUM CHLORIDE CRYS ER 20 MEQ PO TBCR
40.0000 meq | EXTENDED_RELEASE_TABLET | Freq: Once | ORAL | Status: AC
  Administered 2021-02-23: 40 meq via ORAL
  Filled 2021-02-23: qty 2

## 2021-02-23 MED ORDER — ACETAMINOPHEN 650 MG RE SUPP: 650.0000 mg | RECTAL | Status: DC | PRN

## 2021-02-23 MED ORDER — ASPIRIN 325 MG PO TABS
325.0000 mg | ORAL_TABLET | Freq: Once | ORAL | Status: AC
  Administered 2021-02-23: 325 mg via ORAL
  Filled 2021-02-23: qty 1

## 2021-02-23 MED ORDER — PANTOPRAZOLE SODIUM 40 MG PO TBEC: 40.0000 mg | DELAYED_RELEASE_TABLET | Freq: Every day | ORAL | Status: DC

## 2021-02-23 MED ORDER — SENNOSIDES-DOCUSATE SODIUM 8.6-50 MG PO TABS: 1.0000 | ORAL_TABLET | Freq: Every evening | ORAL | Status: DC | PRN

## 2021-02-23 MED ORDER — PREDNISONE 20 MG PO TABS
40.0000 mg | ORAL_TABLET | Freq: Every day | ORAL | Status: DC
  Administered 2021-02-23: 40 mg via ORAL
  Filled 2021-02-23: qty 2

## 2021-02-23 MED ORDER — PANTOPRAZOLE SODIUM 40 MG PO TBEC
40.0000 mg | DELAYED_RELEASE_TABLET | Freq: Every day | ORAL | Status: DC
  Administered 2021-02-23 – 2021-02-25 (×3): 40 mg via ORAL
  Filled 2021-02-23 (×3): qty 1

## 2021-02-23 MED ORDER — SODIUM CHLORIDE 0.9 % IV SOLN: INTRAVENOUS | Status: DC

## 2021-02-23 MED ORDER — ACETAMINOPHEN 325 MG PO TABS
650.0000 mg | ORAL_TABLET | ORAL | Status: DC | PRN
  Administered 2021-02-25: 650 mg via ORAL
  Filled 2021-02-23: qty 2

## 2021-02-23 MED ORDER — PREDNISONE 20 MG PO TABS
20.0000 mg | ORAL_TABLET | Freq: Once | ORAL | Status: AC
  Administered 2021-02-23: 20 mg via ORAL
  Filled 2021-02-23: qty 1

## 2021-02-23 MED ORDER — STROKE: EARLY STAGES OF RECOVERY BOOK
Freq: Once | Status: AC
  Filled 2021-02-23: qty 1

## 2021-02-23 MED ORDER — HEPARIN SODIUM (PORCINE) 5000 UNIT/ML IJ SOLN
5000.0000 [IU] | Freq: Three times a day (TID) | INTRAMUSCULAR | Status: DC
  Administered 2021-02-23 – 2021-02-25 (×5): 5000 [IU] via SUBCUTANEOUS
  Filled 2021-02-23 (×5): qty 1

## 2021-02-23 MED ORDER — VALACYCLOVIR HCL 500 MG PO TABS
1000.0000 mg | ORAL_TABLET | Freq: Every day | ORAL | Status: DC
  Administered 2021-02-23 – 2021-02-25 (×3): 1000 mg via ORAL
  Filled 2021-02-23 (×3): qty 2

## 2021-02-23 MED ORDER — DOCUSATE SODIUM 100 MG PO CAPS
100.0000 mg | ORAL_CAPSULE | Freq: Two times a day (BID) | ORAL | Status: DC | PRN
  Administered 2021-02-24: 100 mg via ORAL
  Filled 2021-02-23: qty 1

## 2021-02-23 MED ORDER — PREDNISONE 20 MG PO TABS
60.0000 mg | ORAL_TABLET | Freq: Every day | ORAL | Status: DC
  Administered 2021-02-24 – 2021-02-25 (×2): 60 mg via ORAL
  Filled 2021-02-23 (×2): qty 3

## 2021-02-23 NOTE — ED Triage Notes (Addendum)
Pt was working in the yard, at 1400 he got home and had abdominal pain and right hand tingling and left sided facial numbness.

## 2021-02-23 NOTE — Consult Note (Signed)
[facility] TELEMEDICINE  CONSULT   Date of service: February 23, 2021 Patient Name: Elijah Shaffer MRN:  [id number] DOB:  [birth year] Requesting Provider: Milton Ferguson Location of the provider: [facility] Office Location of the patient: [facility] ED bed 14. Reason for consult: "Stroke code."   This consult was provided via telemedicine with 2-way video and audio communication. The patient/family was informed that care would be provided in this way and agreed to receive care in this manner.    History of Present Illness  Elijah Shaffer is a 40 y.o. male with PMH significant for diverticulitis, GERD, allergies who presents with 2-3 day hx of abdominal pain and facial droop on the left with R sided numbness. Reports that the facial droop and numbness started at 1230 on 02/23/21. Symptoms significantly improved in the ED with complete resolution of numbness and minor  Facial asymmetry that is not noted with face at rest or when talking, only able to see when he is smiling. Stroke Measures   Last Known Well: 4098 on 02/23/21 TPA Given: No, too mild to treat IR Thrombectomy: No, too mild to treat. mRS: Modified Rankin Scale: 0-Completely asymptomatic and back to baseline post- stroke Time of teleneurologist evaluation: 1440   Vitals   Vitals:   02/23/21 1434  Weight: 111.1 kg  Height: 6\' 3"  (1.905 m)     Body mass index is 30.62 kg/m.   Tele-neuro Exam and NIHSS   General: awake, aelrt, provides history himself.  NIHSS components Score: Comment  1a Level of Conscious 0[x]  1[]  2[]  3[]      1b LOC Questions 0[x]  1[]  2[]       1c LOC Commands 0[x]  1[]  2[]       2 Best Gaze 0[x]  1[]  2[]       3 Visual 0[x]  1[]  2[]  3[]      4 Facial Palsy 0[]  1[x]  2[]  3[]    Only noted when he attempts to smile.  5a Motor Arm - left 0[x]  1[]  2[]  3[]  4[]  UN[]    5b Motor Arm - Right 0[x]  1[]  2[]  3[]  4[]  UN[]    6a Motor Leg - Left 0[x]  1[]  2[]  3[]  4[]  UN[]    6b Motor Leg - Right 0[x]   1[]  2[]  3[]  4[]  UN[]    7 Limb Ataxia 0[x]  1[]  2[]  3[]  UN[]     8 Sensory 0[x]  1[]  2[]  UN[]      9 Best Language 0[x]  1[]  2[]  3[]      10 Dysarthria 0[x]  1[]  2[]  UN[]      11 Extinct. and Inattention 0[x]  1[]  2[]       TOTAL: 1      Imaging and Labs   CBC:  Recent Labs  Lab 02/23/21 1439  HGB 16.7  HCT 11.9    Basic Metabolic Panel:  Lab Results  Component Value Date   NA 143 02/23/2021   K 3.4 (L) 02/23/2021   CO2 26 10/14/2018   GLUCOSE 79 02/23/2021   BUN 12 02/23/2021   CREATININE 1.50 (H) 02/23/2021   CALCIUM 9.4 10/14/2018   GFRNONAA >60 10/14/2018   GFRAA >60 10/14/2018   Lipid Panel:  Lab Results  Component Value Date   LDLCALC 104 (H) 10/14/2018   HgbA1c:  Lab Results  Component Value Date   HGBA1C 5.0 10/14/2018   Urine Drug Screen: No results found for: LABOPIA, COCAINSCRNUR, LABBENZ, AMPHETMU, THCU, LABBARB  Alcohol Level No results found for: [facility]   CTH without contrast: CTH was negative for a large hypodensity concerning for a large territory  infarct or hyperdensity concerning for an [place]   Impression   Elijah Shaffer is a 40 y.o. male with PMH significant for diverticulitis, GERD, allergies who presents with 2-3 day hx of abdominal pain and facial droop on the left with R sided numbness. His limited tele-neurologic examination is notable for R facial asyymetry that is not notable when he is resting his face or even when he is talking, only apparent when he attempts to smile. No tPA offered as symptoms too mild. No thrombectomy aain due to symptoms being too mild. CTH w/o contrast with no acute abnormalities. Suspect this is a TIA or a minor ischemic stroke.  Recommendations  Plan:  - Frequent Neuro checks per stroke unit protocol - Recommend brain imaging with MRI Brain without contrast - Recommend Vascular imaging with MRA Angio Head without contrast and US Carotid doppler - Recommend obtaining TTE - Recommend obtaining Lipid panel with LDL -  Please start statin if LDL > 70 - Recommend HbA1c - Antithrombotic - Aspirin 81mg  daily and plavix 75mg  daily x 21 days, then aspirin 81mg  daily alone. - Recommend DVT ppx - SBP goal - permissive hypertension first 24 h < 220/110. Held home meds.  - Recommend Telemetry monitoring for arrythmia - Recommend bedside swallow screen prior to PO intake. - Stroke education booklet - Recommend PT/OT/SLP consult - Recommend Urine Tox screen.  ______________________________________________________________________    This patient is receiving care for possible acute neurological changes. There was 30 minutes of care by this provider at the time of service, including time for direct evaluation via telemedicine, review of medical records, imaging studies and discussion of findings with providers, the patient and/or family.  Donnetta Simpers [facility] Pager Number [phone]  If 7pm- 7am, please page neurology on call as listed in [place].

## 2021-02-23 NOTE — ED Notes (Signed)
Not able to get temp d/t stroke assessments

## 2021-02-23 NOTE — ED Provider Notes (Signed)
[facility] EMERGENCY DEPARTMENT Provider Note   CSN: [id number] Arrival date & time: 02/23/21  1356  An emergency department physician performed an initial assessment on this suspected stroke patient at 1440.  History Chief Complaint  Patient presents with  . Abdominal Pain  . Numbness    Elijah Shaffer is a 40 y.o. male with past medical history of diverticulitis, GERD, seasonal allergies that presents to the emergency department today for acute onset of right handed tingling, left-sided facial numbness and left facial droop.  Patient states that he was working at his current job, does Biomedical scientist, states that he came home from work and sat down in a chair around 2 he noticed all the symptoms appear.  Denies any slurred speech, vision changes, aphasia, drooling, weakness.  States that right hand and left facial tingling have mainly resolved while being here, barely has the symptoms now.  Patient also noted while here in the ER to have left-sided facial droop.  Dr. Roderic Palau at bedside called code stroke.  Patient also states for the past 2 days he has had abdominal pain, has been generalized, denies any bloody stools.  States that he T been having vomiting, denies any diarrhea.  Denies any fevers or chills.  Denies any pain radiating anywhere.  No sick contacts.  Denies any urinary symptoms.  No CP or Sob.  HPI     Past Medical History:  Diagnosis Date  . Diverticulitis   . GERD (gastroesophageal reflux disease) 01/08/2018  . Seasonal allergies     Patient Active Problem List   Diagnosis Date Noted  . 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 GERD (gastroesophageal reflux disease) 01/08/2018  . Diverticulitis of colon 09/15/2017  . Sigmoid diverticulitis   . Acute renal injury ([place]) 06/28/2017    Past Surgical History:  Procedure Laterality Date  . COLONOSCOPY N/A 09/22/2017   Procedure: COLONOSCOPY;  Surgeon: Rogene Houston, MD;  Location: [facility];  Service: Endoscopy;   Laterality: N/A;  9:55  . POLYPECTOMY  09/22/2017   Procedure: POLYPECTOMY;  Surgeon: Rogene Houston, MD;  Location: [facility];  Service: Endoscopy;;       Family History  Problem Relation Age of Onset  . Colon cancer Maternal Grandmother   . Colon cancer Maternal Grandfather   . Healthy Mother   . Healthy Father     Social History   Tobacco Use  . Smoking status: Never Smoker  . Smokeless tobacco: Never Used  Vaping Use  . Vaping Use: Never used  Substance Use Topics  . Alcohol use: Yes    Comment: occasional  . Drug use: No    Home Medications Prior to Admission medications   Medication Sig Start Date End Date Taking? Authorizing Provider  acetaminophen (TYLENOL) 500 MG tablet Take 500 mg by mouth every 6 (six) hours as needed.   Yes [provider]  cetirizine (ZYRTEC) 10 MG tablet Take 10 mg by mouth daily.   Yes [provider]  docusate sodium (COLACE) 100 MG capsule Take 100 mg by mouth daily.   Yes [provider]  esomeprazole (NEXIUM) 40 MG capsule Take 40 mg by mouth daily as needed (reflux).   Yes [provider]  triamcinolone ointment (KENALOG) 0.5 % Apply 1 application topically daily as needed (itching). 12/06/19  Yes [provider]  hydrOXYzine (ATARAX/VISTARIL) 25 MG tablet Take 25 mg by mouth at bedtime. Patient not taking: Reported on 02/23/2021 10/25/20   [provider]  Allergies    Other and Claritin [loratadine]  Review of Systems   Review of Systems  Constitutional: Negative for chills, diaphoresis, fatigue and fever.  HENT: Negative for congestion, sore throat and trouble swallowing.   Eyes: Negative for pain and visual disturbance.  Respiratory: Negative for cough, shortness of breath and wheezing.   Cardiovascular: Negative for chest pain, palpitations and leg swelling.  Gastrointestinal: Positive for abdominal pain, nausea and vomiting. Negative for abdominal distention and  diarrhea.  Genitourinary: Negative for difficulty urinating.  Musculoskeletal: Negative for back pain, neck pain and neck stiffness.  Skin: Negative for pallor.  Neurological: Positive for facial asymmetry and numbness. Negative for dizziness, speech difficulty, weakness, light-headedness and headaches.  Psychiatric/Behavioral: Negative for confusion.    Physical Exam Updated Vital Signs BP (!) 149/100   Pulse 76   Temp 98.4 F (36.9 C) (Oral)   Resp (!) 26   Ht 6\' 3"  (1.905 m)   Wt 111.1 kg   SpO2 98%   BMI 30.62 kg/m   Physical Exam Constitutional:      General: He is not in acute distress.    Appearance: Normal appearance. He is not ill-appearing, toxic-appearing or diaphoretic.  HENT:     Mouth/Throat:     Mouth: Mucous membranes are moist.     Pharynx: Oropharynx is clear.  Eyes:     General: No scleral icterus.    Extraocular Movements: Extraocular movements intact.     Pupils: Pupils are equal, round, and reactive to light.  Cardiovascular:     Rate and Rhythm: Normal rate and regular rhythm.     Pulses: Normal pulses.     Heart sounds: Normal heart sounds.  Pulmonary:     Effort: Pulmonary effort is normal. No respiratory distress.     Breath sounds: Normal breath sounds. No stridor. No wheezing, rhonchi or rales.  Chest:     Chest wall: No tenderness.  Abdominal:     General: Abdomen is flat. There is no distension.     Palpations: Abdomen is soft.     Tenderness: There is generalized abdominal tenderness. There is no right CVA tenderness, guarding or rebound. Negative signs include Murphy's sign, McBurney's sign and obturator sign.  Musculoskeletal:        General: No swelling or tenderness. Normal range of motion.     Cervical back: Normal range of motion and neck supple. No rigidity.     Right lower leg: No edema.     Left lower leg: No edema.  Skin:    General: Skin is warm and dry.     Capillary Refill: Capillary refill takes less than 2 seconds.      Coloration: Skin is not pale.  Neurological:     General: No focal deficit present.     Mental Status: He is alert and oriented to person, place, and time.     Comments: Alert and oriented x3.  Patient is unsure of the exact date, knows that it the middle of April.. Clear speech without any drooling.  Facial asymmetry noted on left side with smile, is not present when patient speaks.  No drooling.  Symmetric rise of eyebrows.  Normal sensation to bilateral right and left side of face.  CNIII-XII otherwise grossly intact. Bilateral upper and lower extremities' sensation grossly intact. 5/5 symmetric strength with grip strength and with plantar and dorsi flexion bilaterally.  Normal sensation to bilateral extremities.  Patellar DTRs are 2+ and symmetric . Normal finger to nose  bilaterally. Negative pronator drift.   Psychiatric:        Mood and Affect: Mood normal.        Behavior: Behavior normal.     ED Results / Procedures / Treatments   Labs (all labs ordered are listed, but only abnormal results are displayed) Labs Reviewed  CBC - Abnormal; Notable for the following components:      Result Value   WBC 12.7 (*)    All other components within normal limits  DIFFERENTIAL - Abnormal; Notable for the following components:   Neutro Abs 8.2 (*)    All other components within normal limits  COMPREHENSIVE METABOLIC PANEL - Abnormal; Notable for the following components:   Potassium 3.3 (*)    Creatinine, Ser 1.40 (*)    All other components within normal limits  I-STAT CHEM 8, ED - Abnormal; Notable for the following components:   Potassium 3.4 (*)    Creatinine, Ser 1.50 (*)    All other components within normal limits  RESP PANEL BY RT-PCR (FLU A&B, COVID) ARPGX2  ETHANOL  PROTIME-INR  APTT  LIPASE, BLOOD  CK  RAPID URINE DRUG SCREEN, HOSP PERFORMED  URINALYSIS, ROUTINE W REFLEX MICROSCOPIC  CBG MONITORING, ED  I-STAT CHEM 8, ED    EKG None  Radiology MR ANGIO HEAD WO  CONTRAST  Result Date: 02/23/2021 CLINICAL DATA:  Facial droop and numbness, and now largely resolved. EXAM: MRI HEAD WITHOUT CONTRAST MRA HEAD WITHOUT CONTRAST TECHNIQUE: Multiplanar, multiecho pulse sequences of the brain and surrounding structures were obtained without intravenous contrast. Angiographic images of the head were obtained using MRA technique without contrast. COMPARISON:  Head CT 02/23/2021 FINDINGS: MRI HEAD FINDINGS Brain: There is no evidence of an acute infarct, intracranial hemorrhage, mass, midline shift, or extra-axial fluid collection. The ventricles and sulci are normal. The brain is normal in signal. The cerebellar tonsils are normally positioned. Vascular: Major intracranial vascular flow voids are preserved. Skull and upper cervical spine: Unremarkable bone marrow signal. Sinuses/Orbits: Unremarkable orbits. Small mucous retention cysts in the maxillary sinuses. Clear mastoid air cells. Other: None. MRA HEAD FINDINGS The visualized distal vertebral arteries are patent to the basilar with the right being strongly dominant. Patent right PICA, bilateral AICAs, and bilateral SCAs are visualized. The basilar artery is widely patent. Posterior communicating arteries are not identified and may be diminutive or absent. Both PCAs are patent without evidence of significant proximal stenosis. The internal carotid arteries are widely patent from skull base to carotid termini. ACAs and MCAs are patent without evidence of a proximal branch occlusion or significant proximal stenosis. No aneurysm is identified. IMPRESSION: 1. Unremarkable appearance of the brain. 2. Negative head MRA. Electronically Signed   By: Logan Bores M.D.   On: 02/23/2021 16:39   MR Brain Wo Contrast (neuro protocol)  Result Date: 02/23/2021 CLINICAL DATA:  Facial droop and numbness, and now largely resolved. EXAM: MRI HEAD WITHOUT CONTRAST MRA HEAD WITHOUT CONTRAST TECHNIQUE: Multiplanar, multiecho pulse sequences of the  brain and surrounding structures were obtained without intravenous contrast. Angiographic images of the head were obtained using MRA technique without contrast. COMPARISON:  Head CT 02/23/2021 FINDINGS: MRI HEAD FINDINGS Brain: There is no evidence of an acute infarct, intracranial hemorrhage, mass, midline shift, or extra-axial fluid collection. The ventricles and sulci are normal. The brain is normal in signal. The cerebellar tonsils are normally positioned. Vascular: Major intracranial vascular flow voids are preserved. Skull and upper cervical spine: Unremarkable bone marrow signal. Sinuses/Orbits: Unremarkable  orbits. Small mucous retention cysts in the maxillary sinuses. Clear mastoid air cells. Other: None. MRA HEAD FINDINGS The visualized distal vertebral arteries are patent to the basilar with the right being strongly dominant. Patent right PICA, bilateral AICAs, and bilateral SCAs are visualized. The basilar artery is widely patent. Posterior communicating arteries are not identified and may be diminutive or absent. Both PCAs are patent without evidence of significant proximal stenosis. The internal carotid arteries are widely patent from skull base to carotid termini. ACAs and MCAs are patent without evidence of a proximal branch occlusion or significant proximal stenosis. No aneurysm is identified. IMPRESSION: 1. Unremarkable appearance of the brain. 2. Negative head MRA. Electronically Signed   By: Logan Bores M.D.   On: 02/23/2021 16:39   CT HEAD CODE STROKE WO CONTRAST  Result Date: 02/23/2021 CLINICAL DATA:  Code stroke. Neuro deficit, acute, stroke suspected. Additional history provided: Abdominal pain and right hand tingling as well as left-sided facial numbness. EXAM: CT HEAD WITHOUT CONTRAST TECHNIQUE: Contiguous axial images were obtained from the base of the skull through the vertex without intravenous contrast. COMPARISON:  No pertinent prior exams available for comparison. FINDINGS:  Brain: Cerebral volume is normal. There is no acute intracranial hemorrhage. No demarcated cortical infarct. No extra-axial fluid collection. No evidence of intracranial mass. No midline shift. Vascular: No hyperdense vessel. Skull: Normal. Negative for fracture or focal lesion. Sinuses/Orbits: Visualized orbits show no acute finding. Small left maxillary sinus mucous retention cyst. ASPECTS ([place] Stroke Program Early CT Score) - Ganglionic level infarction (caudate, lentiform nuclei, internal capsule, insula, M1-M3 cortex): 7 - Supraganglionic infarction (M4-M6 cortex): 3 Total score (0-10 with 10 being normal): 10 These results were called by telephone at the time of interpretation on 02/23/2021 at 3:03 pm to provider Dr. Roderic Palau, who verbally acknowledged these results. IMPRESSION: No evidence of acute intracranial abnormality. ASEPCTS is 10. Electronically Signed   By: Kellie Simmering DO   On: 02/23/2021 15:04    Procedures .Critical Care Performed by: Alfredia Client, PA-C Authorized by: Alfredia Client, PA-C   Critical care provider statement:    Critical care time (minutes):  45   Critical care was time spent personally by me on the following activities:  Discussions with consultants, evaluation of patient's response to treatment, examination of patient, ordering and performing treatments and interventions, ordering and review of laboratory studies, ordering and review of radiographic studies, pulse oximetry, re-evaluation of patient's condition, obtaining history from patient or surrogate and review of old charts     Medications Ordered in ED Medications  0.9 %  sodium chloride infusion ( Intravenous New Bag/Given 02/23/21 1708)  predniSONE (DELTASONE) tablet 40 mg (40 mg Oral Given 02/23/21 1715)  valACYclovir (VALTREX) tablet 1,000 mg (1,000 mg Oral Given 02/23/21 1714)  aspirin tablet 325 mg (325 mg Oral Given 02/23/21 1513)  potassium chloride SA (KLOR-CON) CR tablet 40 mEq (40 mEq Oral Given  02/23/21 1714)  sodium chloride 0.9 % bolus 250 mL (250 mLs Intravenous New Bag/Given 02/23/21 1707)    ED Course  I have reviewed the triage vital signs and the nursing notes.  Pertinent labs & imaging results that were available during my care of the patient were reviewed by me and considered in my medical decision making (see chart for details).    MDM Rules/Calculators/A&P                          Elijah Shaffer is a  40 y.o. male with past medical history of diverticulitis, GERD, seasonal allergies that presents to the emergency department today for acute onset of right handed tingling, left-sided facial numbness and left facial droop.  Code stroke called, patient currently being evaluated by teleneurology.  CT head without any acute findings.  Abdomen with generalized tenderness, no guarding.  No surgical abdomen.  No vomiting here.  Blood work today unremarkable.  Spoke to neurology. Dr. Tarri Fuller ptto be admitted for TIA wworkup.  Patient admitted to Dr. Landis Gandy.  CT abdomen pelvis pending.   The patient appears reasonably stabilized for admission considering the current resources, flow, and capabilities available in the ED at this time, and I doubt any other [facility] requiring further screening and/or treatment in the ED prior to admission.  Final Clinical Impression(s) / ED Diagnoses Final diagnoses:  TIA (transient ischemic attack)  Generalized abdominal pain    Rx / DC Orders ED Discharge Orders    None       Alfredia Client, PA-C 02/23/21 1725    Milton Ferguson, MD 02/27/21 [phone]

## 2021-02-23 NOTE — H&P (Signed)
TRH H&P   Patient Demographics:    Elijah Shaffer, is a 40 y.o. male  MRN: [id number]   DOB - 01-Oct-1981  Admit Date - 02/23/2021  Outpatient Primary MD for the patient is Jani Gravel, MD  Referring MD/NP/PA: PA Patel  Patient coming from: Home  Chief Complaint  Patient presents with  . Abdominal Pain  . Numbness      HPI:    Elijah Shaffer  is a 40 y.o. 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 -In ED been has facial droop code stroke was called, tele neurology were consulted,CT head with no acute findings, creatinine around baseline 1.4, potassium 3.3, hemoglobin 16.7, white blood cell count 12.7 full dose aspirin and telemetry neurology amended admission for TIA work-up.    Review of systems:    In addition to the HPI above,  No Fever-chills, No Headache, No changes with Vision or hearing, No problems swallowing food or Liquids, No Chest pain, Cough or Shortness of Breath, Complains of abdominal pain, nausea and vomiting No Blood in stool or Urine, No dysuria, No  new skin rashes or bruises, No new joints pains-aches,  Planes of left facial droop, and right hand tingling/numbness No recent weight gain or loss, No polyuria, polydypsia or polyphagia, No significant Mental Stressors.  A full 10 point Review of Systems was done, except as stated above, all other Review of Systems were negative.   With Past History of the following :    Past Medical History:  Diagnosis Date  . Diverticulitis   . GERD (gastroesophageal reflux disease) 01/08/2018  . Seasonal allergies       Past Surgical History:  Procedure Laterality Date  . COLONOSCOPY N/A 09/22/2017   Procedure: COLONOSCOPY;  Surgeon: Rogene Houston, MD;  Location: [facility];  Service: Endoscopy;  Laterality: N/A;  9:55  . POLYPECTOMY  09/22/2017   Procedure: POLYPECTOMY;  Surgeon: Rogene Houston, MD;  Location: [facility];  Service: Endoscopy;;      Social History:     Social History   Tobacco Use  . Smoking status: Never Smoker  . Smokeless tobacco: Never Used  Substance  Use Topics  . Alcohol use: Yes    Comment: occasional       Family History :     Family History  Problem Relation Age of Onset  . Colon cancer Maternal Grandmother   . Colon cancer Maternal Grandfather   . Healthy Mother   . Healthy Father      Home Medications:   Prior to Admission medications   Medication Sig Start Date End Date Taking? Authorizing Provider  acetaminophen (TYLENOL) 500 MG tablet Take 500 mg by mouth every 6 (six) hours as needed.    [provider]  cetirizine (ZYRTEC) 10 MG tablet Take 10 mg by mouth daily.    [provider]  docusate sodium (COLACE) 100 MG capsule Take 100 mg by mouth 2 (two) times daily as needed for mild constipation.    [provider]  esomeprazole (NEXIUM) 40 MG capsule Take 40 mg by mouth daily as needed (reflux).    [provider]  hydrOXYzine (ATARAX/VISTARIL) 25 MG tablet Take 25 mg by mouth at bedtime.  10/25/20   [provider]  triamcinolone ointment (KENALOG) 0.5 % Apply 1 application topically daily as needed (itching). 12/06/19   [provider]     Allergies:     Allergies  Allergen Reactions  . Other Other (See Comments)    seeds  . Claritin [Loratadine] Other (See Comments)    Heart race     Physical Exam:   Vitals  Blood pressure (!) 135/103, pulse 84, resp. rate (!) 23, height 6\' 3"  (1.905 m), weight 111.1 kg, SpO2 98 %.   1. General well-developed male, laying in bed, no apparent distress  2. Normal affect and insight, Not Suicidal or Homicidal, Awake Alert, Oriented X 3.  3.  With significant left facial droop, left eye repro elevation is diminished as well,, Strength 5/5 all 4 extremities, Sensation intact all 4 extremities, Plantars down going.  4. Ears and Eyes appear Normal, Conjunctivae clear, PERRLA. Moist Oral Mucosa.  5. Supple Neck, No JVD, No cervical lymphadenopathy appriciated, No Carotid Bruits.  6. Symmetrical Chest wall movement, Good air movement bilaterally, CTAB.  7. RRR, No Gallops, Rubs or Murmurs, No Parasternal Heave.  8. Positive Bowel Sounds, Abdomen Soft, lower quadrant, mid lower quadrant tenderness to palpation no organomegaly appriciated,No rebound -guarding or rigidity.  9.  No Cyanosis, Normal Skin Turgor, No Skin Rash or Bruise.  10. Good muscle tone,  joints appear normal , no effusions, Normal ROM.  11. No Palpable Lymph Nodes in Neck or Axillae    Data Review:    CBC Recent Labs  Lab 02/23/21 1430 02/23/21 1439  WBC 12.7*  --   HGB 16.7 16.7  HCT 48.9 49.0  PLT 280  --   MCV 89.2  --   MCH 30.5  --   MCHC 34.2  --   RDW 12.8  --   LYMPHSABS 3.4  --   MONOABS 1.0  --   EOSABS 0.0  --   BASOSABS 0.1  --    ------------------------------------------------------------------------------------------------------------------  Chemistries  Recent Labs  Lab 02/23/21 1430 02/23/21 1439  NA  138 143  K 3.3* 3.4*  CL 106 106  CO2 22  --   GLUCOSE 80 79  BUN 13 12  CREATININE 1.40* 1.50*  CALCIUM 9.6  --   AST 19  --   ALT 20  --   ALKPHOS 60  --   BILITOT 1.2  --    ------------------------------------------------------------------------------------------------------------------  estimated creatinine clearance is 88.9 mL/min (A) (by C-G formula based on SCr of 1.5 mg/dL (H)). ------------------------------------------------------------------------------------------------------------------ No results for input(s): TSH, T4TOTAL, T3FREE, THYROIDAB in the last 72 hours.  Invalid input(s): FREET3  Coagulation profile Recent Labs  Lab 02/23/21 1430  INR 1.0   ------------------------------------------------------------------------------------------------------------------- No results for input(s): DDIMER in the last 72 hours. -------------------------------------------------------------------------------------------------------------------  Cardiac Enzymes No results for input(s): CKMB, TROPONINI, MYOGLOBIN in the last 168 hours.  Invalid input(s): CK ------------------------------------------------------------------------------------------------------------------ No results found for: BNP   ---------------------------------------------------------------------------------------------------------------  Urinalysis    Component Value Date/Time   COLORURINE YELLOW 10/14/2018 [place] 10/14/2018 1353   LABSPEC >1.046 (H) 10/14/2018 1353   PHURINE 6.0 10/14/2018 1353   GLUCOSEU NEGATIVE 10/14/2018 1353   HGBUR NEGATIVE 10/14/2018 1353   BILIRUBINUR NEGATIVE 10/14/2018 1353   KETONESUR 20 (A) 10/14/2018 1353   PROTEINUR NEGATIVE 10/14/2018 1353   UROBILINOGEN 0.2 04/18/2010 2028   NITRITE NEGATIVE 10/14/2018 1353   LEUKOCYTESUR NEGATIVE 10/14/2018 1353     ----------------------------------------------------------------------------------------------------------------   Imaging Results:    CT HEAD CODE STROKE WO CONTRAST  Result Date: 02/23/2021 CLINICAL DATA:  Code stroke. Neuro deficit, acute, stroke suspected. Additional history provided: Abdominal pain and right hand tingling as well as left-sided facial numbness. EXAM: CT HEAD WITHOUT CONTRAST TECHNIQUE: Contiguous axial images were obtained from the base of the skull through the vertex without intravenous contrast. COMPARISON:  No pertinent prior exams available for comparison. FINDINGS: Brain: Cerebral volume is normal. There is no acute intracranial hemorrhage. No demarcated cortical infarct. No extra-axial fluid collection. No evidence of intracranial mass. No midline shift. Vascular: No hyperdense vessel. Skull: Normal. Negative for fracture or focal lesion. Sinuses/Orbits: Visualized orbits show no acute finding. Small left maxillary sinus mucous retention cyst. ASPECTS ([place] Stroke Program Early CT Score) - Ganglionic level infarction (caudate, lentiform nuclei, internal capsule, insula, M1-M3 cortex): 7 - Supraganglionic infarction (M4-M6 cortex): 3 Total score (0-10 with 10 being normal): 10 These results were called by telephone at the time of interpretation on 02/23/2021 at 3:03 pm to provider Dr. Roderic Palau, who verbally acknowledged these results. IMPRESSION: No evidence of acute intracranial abnormality. ASEPCTS is 10. Electronically Signed   By: Kellie Simmering DO   On: 02/23/2021 15:04    My personal review of EKG: Rhythm NSR, Rate  91 /min, QTc420   Assessment & Plan:    Active Problems:   GERD (gastroesophageal reflux disease)   Focal neurological deficit  Transient focal deficits -Most of symptoms currently resolved, still with very mild left facial droop. -Telemetry neurology consult greatly appreciated, will admit for TIA/CVA work-up, head with no acute findings, will  obtain MRI brain, MRA head, 2D echo, carotid Dopplers, lipid panels, and hemoglobin A1c, will continue with aspirin and Plavix pending further work-up. -PT/OT/SLP consulted. -Allow for permissive hypertension. -Monitor on telemetry for arrhythmias. -There is a possibility of Bell's palsy as well, so he will be started empirically on aspirin and Plavix  Hypokalemia -Repleted  Abdominal pain/nausea/vomiting -Lipase within normal limit. -Check CT abdomen pelvis to rule out diverticulitis -We will check urine analysis -We will check CK to rule out rhabdo given he has been working prolonged hours under the sun.  GERD -Continue with PPI  DVT Prophylaxis Heparin  AM Labs Ordered, also please review Full Orders  Family Communication: Admission, patients condition and plan of care including tests being ordered have been discussed with the patient  who indicate understanding and agree with the plan and Code Status.  Code Status Full  Likely DC to  Home  Condition GUARDED    Consults called: tele neurology    Admission status: Observation  Time spent in minutes :50 minutes   Phillips Climes M.D on 02/23/2021 at [place] PM   [facility] - Office  [phone]

## 2021-02-23 NOTE — ED Notes (Signed)
Dr Roderic Palau notified about a possible code stroke in room 13.

## 2021-02-23 NOTE — ED Notes (Signed)
PA at bedside.

## 2021-02-23 NOTE — ED Notes (Signed)
ED Provider at bedside. 

## 2021-02-23 NOTE — Progress Notes (Signed)
1437 call time 1438 beeper time 1453 exam started 1455 exam started 1455 images sent to soc 1456 exam completed in epic 1457 [facility] radiology called

## 2021-02-23 NOTE — ED Notes (Signed)
Neuro consult at bedside.

## 2021-02-24 ENCOUNTER — Observation Stay (HOSPITAL_BASED_OUTPATIENT_CLINIC_OR_DEPARTMENT_OTHER): Payer: 59

## 2021-02-24 ENCOUNTER — Observation Stay (HOSPITAL_COMMUNITY): Payer: 59

## 2021-02-24 DIAGNOSIS — G459 Transient cerebral ischemic attack, unspecified: Secondary | ICD-10-CM | POA: Diagnosis not present

## 2021-02-24 DIAGNOSIS — R29818 Other symptoms and signs involving the nervous system: Secondary | ICD-10-CM | POA: Diagnosis not present

## 2021-02-24 HISTORY — PX: TRANSTHORACIC ECHOCARDIOGRAM: SHX275

## 2021-02-24 LAB — LIPID PANEL
Cholesterol: 154 mg/dL (ref 0–200)
HDL: 43 mg/dL (ref >40)
LDL Cholesterol: 94 mg/dL (ref 0–99)
Total CHOL/HDL Ratio: 3.6 RATIO
Triglycerides: 85 mg/dL (ref <150)
VLDL: 17 mg/dL (ref 0–40)

## 2021-02-24 LAB — BASIC METABOLIC PANEL
Anion gap: 8 (ref 5–15)
BUN: 17 mg/dL (ref 6–20)
CO2: 22 mmol/L (ref 22–32)
Calcium: 8.9 mg/dL (ref 8.9–10.3)
Chloride: 104 mmol/L (ref 98–111)
Creatinine, Ser: 1.37 mg/dL — ABNORMAL HIGH (ref 0.61–1.24)
GFR, Estimated: >60 mL/min (ref >60)
Glucose, Bld: 142 mg/dL — ABNORMAL HIGH (ref 70–99)
Potassium: 3.7 mmol/L (ref 3.5–5.1)
Sodium: 134 mmol/L — ABNORMAL LOW (ref 135–145)

## 2021-02-24 LAB — GLUCOSE, CAPILLARY: Glucose-Capillary: 110 mg/dL — ABNORMAL HIGH (ref 70–99)

## 2021-02-24 LAB — HIV ANTIBODY (ROUTINE TESTING W REFLEX): HIV Screen 4th Generation wRfx: NONREACTIVE

## 2021-02-24 LAB — ECHOCARDIOGRAM COMPLETE
Area-P 1/2: 3.28 cm2
Height: 75 in
S' Lateral: 2.7 cm
Weight: 3920 oz

## 2021-02-24 MED ORDER — HYDROXYZINE HCL 10 MG PO TABS
10.0000 mg | ORAL_TABLET | Freq: Three times a day (TID) | ORAL | Status: DC | PRN
  Administered 2021-02-24 (×2): 10 mg via ORAL
  Filled 2021-02-24 (×2): qty 1

## 2021-02-24 NOTE — Evaluation (Signed)
Physical Therapy Evaluation Patient Details Name: Elijah Shaffer MRN: [id number] DOB: 10-09-1981 Today's Date: 02/24/2021   History of Present Illness  Elijah Shaffer  is a 40 y.o. 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    Clinical Impression     Patient sitting up in bed at start of session. Able to perform all bed mobilities and ambulation with independence and patient is functioning at base line. Able to ascend and descend stairs without use of railings and with good balance. No PT needs at this time secondary to functioning at baseline. Placing outpatient PT recommendation secondary generalized weakness and patient with desire to follow up with them if weakness persists post discharge from PT. Patient has met all physical therapy goals at this time and is discharged from physical therapy to care of nursing for ambulation daily as tolerated for length of stay.    Follow Up Recommendations No PT follow up;Outpatient PT    Equipment Recommendations  None recommended by PT    Recommendations for Other Services       Precautions / Restrictions        Mobility  Bed  Mobility                    Transfers Overall transfer level: Independent                  Ambulation/Gait Ambulation/Gait assistance: Independent Gait Distance (Feet): 175 Feet Assistive device: IV Pole Gait Pattern/deviations: Decreased stride length Gait velocity: decreased   General Gait Details: decreased gait speed but no loss of balance, deviations from projected path or complaints of pain. Generalized fatigue noted towards end of session  Stairs Stairs: Yes Stairs assistance: Modified independent (Device/Increase time) Stair Management: No rails;Alternating pattern Number of Stairs: 4 General stair comments: step through gait pattern ascending and descending steps  Wheelchair Mobility    Modified Rankin (Stroke Patients Only)       Balance Overall balance assessment: Independent                                           Pertinent Vitals/Pain Pain Assessment: No/denies pain    Home Living Family/patient expects to be discharged to:: Private residence Living Arrangements: Parent Available Help at Discharge: Family Type of Home: House Home Access: Stairs to enter Entrance Stairs-Rails: None Entrance Stairs-Number of Steps: 4 Home Layout: One level Home Equipment: Walker - 2 wheels      Prior Function Level of Independence: Independent         Comments: independent at baseline with all ADLs , no assistive device used     Hand Dominance  Extremity/Trunk Assessment   Upper Extremity Assessment Upper Extremity Assessment: Generalized weakness            Communication   Communication: No difficulties  Cognition Arousal/Alertness: Awake/alert   Overall Cognitive Status: Within Functional Limits for tasks assessed                                        General Comments      Exercises     Assessment/Plan    PT Assessment Patent does not need any further PT services  PT Problem  List         PT Treatment Interventions      PT Goals (Current goals can be found in the Care Plan section)  Acute Rehab PT Goals Patient Stated Goal: to go home PT Goal Formulation: With patient Time For Goal Achievement: 03/10/21    Frequency     Barriers to discharge        Co-evaluation               AM-PAC PT "6 Clicks" Mobility  Outcome Measure Help needed turning from your back to your side while in a flat bed without using bedrails?: None Help needed moving from lying on your back to sitting on the side of a flat bed without using bedrails?: None Help needed moving to and from a bed to a chair (including a wheelchair)?: None Help needed standing up from a chair using your arms (e.g., wheelchair or bedside chair)?: None Help needed to walk in hospital room?: None Help needed climbing 3-5 steps with a railing? : None 6 Click Score: 24    End of Session Equipment Utilized During Treatment: Gait belt Activity Tolerance: Patient tolerated treatment well Patient left: in bed;with family/visitor present Nurse Communication: Mobility status PT Visit Diagnosis: Muscle weakness (generalized) (M62.81)    Time: 7972-8206 PT Time Calculation (min) (ACUTE ONLY): 15 min   Charges:   PT Evaluation $PT Eval Low Complexity: 1 Low          11:07 AM, 02/24/21 Jerene Pitch, DPT Physical Therapy with [facility]  [phone] office

## 2021-02-24 NOTE — Progress Notes (Addendum)
PROGRESS NOTE  Elijah Shaffer BMW:[id number] DOB: 1981/02/22 DOA: 02/23/2021 PCP: Jani Gravel, MD  HPI/Recap of past 24 hours: Patient seen and examined at bedside he was admitted for left facial droop.  He feels that he is much better but thinks that his left side still feels a little off telemetry neurology was consulted and he has had MRI and MRA done.[name]  He is complaining of feeling anxious and requesting for something for anxiety  Assessment/Plan: Active Problems:   GERD (gastroesophageal reflux disease)   Focal neurological deficit Focal neurologic deficit with transient ischemic attack symptoms are resolving Waiting for PT and OT Hypertension.  Due to his stroke/TIA we are allowing permissive blood pressure Patient was started on empiric aspirin and Plavix  anxiety.  Anxiety he was started on hydroxyzine to take as needed  GERD started him pantoprazole  Hypokalemia which was repleted we will recheck his potassium level  Abdominal pain nausea vomiting resolved Code Status: Full  Severity of Illness: The appropriate patient status for this patient is OBSERVATION. Observation status is judged to be reasonable and necessary in order to provide the required intensity of service to ensure the patient's safety. The patient's presenting symptoms, physical exam findings, and initial radiographic and laboratory data in the context of their medical condition is felt to place them at decreased risk for further clinical deterioration. Furthermore, it is anticipated that the patient will be medically stable for discharge from the hospital within 2 midnights of admission. The following factors support the patient status of observation.   " Ongoing stroke work-up Family Communication: Wife at bedside  Disposition Plan: Once work-up is complete and normal likely discharge in the morning   Consultants:  Telemetry neurology  Procedures:  MRI  MRA  Head CT  2D  echo  Antimicrobials:  None  DVT prophylaxis: Heparin GI prophylaxis Pantoprazole   Objective: Vitals:   02/24/21 0253 02/24/21 0515 02/24/21 0653 02/24/21 0757  BP: 118/66 125/68 120/63 (!) 147/91  Pulse: 63 64 62 63  Resp: 17 15 17 16   Temp: 97.9 F (36.6 C) 97.6 F (36.4 C) 97.7 F (36.5 C) 98 F (36.7 C)  TempSrc: Oral Oral Oral Oral  SpO2: 97% 98% 99% 98%  Weight:      Height:        Intake/Output Summary (Last 24 hours) at 02/24/2021 0848 Last data filed at 02/24/2021 0500 Gross per 24 hour  Intake 2466.53 ml  Output 400 ml  Net 2066.53 ml   Filed Weights   02/23/21 1434  Weight: 111.1 kg   Body mass index is 30.62 kg/m.  Exam:  . General: 40 y.o. year-old male well developed well nourished in no acute distress.  Alert and oriented x3. . Cardiovascular: Regular rate and rhythm with no rubs or gallops.  No thyromegaly or JVD noted.   [name] Respiratory: Clear to auscultation with no wheezes or rales. Good inspiratory effort. . Abdomen: Soft nontender nondistended with normal bowel sounds x4 quadrants. . Musculoskeletal: No lower extremity edema. 2/4 pulses in all 4 extremities. . Skin: No ulcerative lesions noted or rashes, . Psychiatry: Mood is appropriate for condition and setting    Data Reviewed: CBC: Recent Labs  Lab 02/23/21 1430 02/23/21 1439  WBC 12.7*  --   NEUTROABS 8.2*  --   HGB 16.7 16.7  HCT 48.9 49.0  MCV 89.2  --   PLT 280  --    Basic Metabolic Panel: Recent Labs  Lab 02/23/21 1430 02/23/21  1439  NA 138 143  K 3.3* 3.4*  CL 106 106  CO2 22  --   GLUCOSE 80 79  BUN 13 12  CREATININE 1.40* 1.50*  CALCIUM 9.6  --    GFR: Estimated Creatinine Clearance: 88.9 mL/min (A) (by C-G formula based on SCr of 1.5 mg/dL (H)). Liver Function Tests: Recent Labs  Lab 02/23/21 1430  AST 19  ALT 20  ALKPHOS 60  BILITOT 1.2  PROT 7.9  ALBUMIN 4.9   Recent Labs  Lab 02/23/21 1430  LIPASE 38   No results for input(s):  AMMONIA in the last 168 hours. Coagulation Profile: Recent Labs  Lab 02/23/21 1430  INR 1.0   Cardiac Enzymes: Recent Labs  Lab 02/23/21 1430  CKTOTAL 160   BNP (last 3 results) No results for input(s): PROBNP in the last 8760 hours. HbA1C: No results for input(s): HGBA1C in the last 72 hours. CBG: Recent Labs  Lab 02/23/21 1436  GLUCAP 77   Lipid Profile: Recent Labs    02/24/21 0525  CHOL 154  HDL 43  LDLCALC 94  TRIG 85  CHOLHDL 3.6   Thyroid Function Tests: No results for input(s): TSH, T4TOTAL, FREET4, T3FREE, THYROIDAB in the last 72 hours. Anemia Panel: No results for input(s): VITAMINB12, FOLATE, FERRITIN, TIBC, IRON, RETICCTPCT in the last 72 hours. Urine analysis:    Component Value Date/Time   COLORURINE YELLOW 02/23/2021 1900   APPEARANCEUR CLEAR 02/23/2021 1900   LABSPEC 1.044 (H) 02/23/2021 1900   PHURINE 6.0 02/23/2021 1900   GLUCOSEU NEGATIVE 02/23/2021 1900   HGBUR NEGATIVE 02/23/2021 1900   BILIRUBINUR NEGATIVE 02/23/2021 1900   KETONESUR 5 (A) 02/23/2021 1900   PROTEINUR 30 (A) 02/23/2021 1900   UROBILINOGEN 0.2 04/18/2010 2028   NITRITE NEGATIVE 02/23/2021 1900   LEUKOCYTESUR NEGATIVE 02/23/2021 1900   Sepsis Labs: @LABRCNTIP (procalcitonin:4,lacticidven:4)  ) Recent Results (from the past 240 hour(s))  Resp Panel by RT-PCR (Flu A&B, Covid) Nasopharyngeal Swab     Status: None   Collection Time: 02/23/21  3:18 PM   Specimen: Nasopharyngeal Swab; Nasopharyngeal(NP) swabs in vial transport medium  Result Value Ref Range Status   SARS Coronavirus 2 by RT PCR NEGATIVE NEGATIVE Final    Comment: (NOTE) SARS-CoV-2 target nucleic acids are NOT DETECTED.  The SARS-CoV-2 RNA is generally detectable in upper respiratory specimens during the acute phase of infection. The lowest concentration of SARS-CoV-2 viral copies this assay can detect is 138 copies/mL. A negative result does not preclude SARS-Cov-2 infection and should not be used as  the sole basis for treatment or other patient management decisions. A negative result may occur with  improper specimen collection/handling, submission of specimen other than nasopharyngeal swab, presence of viral mutation(s) within the areas targeted by this assay, and inadequate number of viral copies(<138 copies/mL). A negative result must be combined with clinical observations, patient history, and epidemiological information. The expected result is Negative.  Fact Sheet for Patients:  EntrepreneurPulse.com.au  Fact Sheet for Healthcare Providers:  IncredibleEmployment.be  This test is no t yet approved or cleared by the [place] FDA and  has been authorized for detection and/or diagnosis of SARS-CoV-2 by FDA under an Emergency Use Authorization (EUA). This EUA will remain  in effect (meaning this test can be used) for the duration of the COVID-19 declaration under Section 564(b)(1) of the Act, 21 U.S.C.section 360bbb-3(b)(1), unless the authorization is terminated  or revoked sooner.       Influenza A by PCR NEGATIVE NEGATIVE  Final   Influenza B by PCR NEGATIVE NEGATIVE Final    Comment: (NOTE) The Xpert Xpress SARS-CoV-2/FLU/RSV plus assay is intended as an aid in the diagnosis of influenza from Nasopharyngeal swab specimens and should not be used as a sole basis for treatment. Nasal washings and aspirates are unacceptable for Xpert Xpress SARS-CoV-2/FLU/RSV testing.  Fact Sheet for Patients: EntrepreneurPulse.com.au  Fact Sheet for Healthcare Providers: IncredibleEmployment.be  This test is not yet approved or cleared by the [place] FDA and has been authorized for detection and/or diagnosis of SARS-CoV-2 by FDA under an Emergency Use Authorization (EUA). This EUA will remain in effect (meaning this test can be used) for the duration of the COVID-19 declaration under Section 564(b)(1) of  the Act, 21 U.S.C. section 360bbb-3(b)(1), unless the authorization is terminated or revoked.  Performed at [facility], [address] [place], [place] [address]       Studies: MR ANGIO HEAD WO CONTRAST  Result Date: 02/23/2021 CLINICAL DATA:  Facial droop and numbness, and now largely resolved. EXAM: MRI HEAD WITHOUT CONTRAST MRA HEAD WITHOUT CONTRAST TECHNIQUE: Multiplanar, multiecho pulse sequences of the brain and surrounding structures were obtained without intravenous contrast. Angiographic images of the head were obtained using MRA technique without contrast. COMPARISON:  Head CT 02/23/2021 FINDINGS: MRI HEAD FINDINGS Brain: There is no evidence of an acute infarct, intracranial hemorrhage, mass, midline shift, or extra-axial fluid collection. The ventricles and sulci are normal. The brain is normal in signal. The cerebellar tonsils are normally positioned. Vascular: Major intracranial vascular flow voids are preserved. Skull and upper cervical spine: Unremarkable bone marrow signal. Sinuses/Orbits: Unremarkable orbits. Small mucous retention cysts in the maxillary sinuses. Clear mastoid air cells. Other: None. MRA HEAD FINDINGS The visualized distal vertebral arteries are patent to the basilar with the right being strongly dominant. Patent right PICA, bilateral AICAs, and bilateral SCAs are visualized. The basilar artery is widely patent. Posterior communicating arteries are not identified and may be diminutive or absent. Both PCAs are patent without evidence of significant proximal stenosis. The internal carotid arteries are widely patent from skull base to carotid termini. ACAs and MCAs are patent without evidence of a proximal branch occlusion or significant proximal stenosis. No aneurysm is identified. IMPRESSION: 1. Unremarkable appearance of the brain. 2. Negative head MRA. Electronically Signed   By: Logan Bores M.D.   On: 02/23/2021 16:39   MR Brain Wo Contrast (neuro  protocol)  Result Date: 02/23/2021 CLINICAL DATA:  Facial droop and numbness, and now largely resolved. EXAM: MRI HEAD WITHOUT CONTRAST MRA HEAD WITHOUT CONTRAST TECHNIQUE: Multiplanar, multiecho pulse sequences of the brain and surrounding structures were obtained without intravenous contrast. Angiographic images of the head were obtained using MRA technique without contrast. COMPARISON:  Head CT 02/23/2021 FINDINGS: MRI HEAD FINDINGS Brain: There is no evidence of an acute infarct, intracranial hemorrhage, mass, midline shift, or extra-axial fluid collection. The ventricles and sulci are normal. The brain is normal in signal. The cerebellar tonsils are normally positioned. Vascular: Major intracranial vascular flow voids are preserved. Skull and upper cervical spine: Unremarkable bone marrow signal. Sinuses/Orbits: Unremarkable orbits. Small mucous retention cysts in the maxillary sinuses. Clear mastoid air cells. Other: None. MRA HEAD FINDINGS The visualized distal vertebral arteries are patent to the basilar with the right being strongly dominant. Patent right PICA, bilateral AICAs, and bilateral SCAs are visualized. The basilar artery is widely patent. Posterior communicating arteries are not identified and may be diminutive or absent. Both PCAs are patent  without evidence of significant proximal stenosis. The internal carotid arteries are widely patent from skull base to carotid termini. ACAs and MCAs are patent without evidence of a proximal branch occlusion or significant proximal stenosis. No aneurysm is identified. IMPRESSION: 1. Unremarkable appearance of the brain. 2. Negative head MRA. Electronically Signed   By: Logan Bores M.D.   On: 02/23/2021 16:39   CT ABDOMEN PELVIS W CONTRAST  Result Date: 02/23/2021 CLINICAL DATA:  Left upper quadrant abdominal pain EXAM: CT ABDOMEN AND PELVIS WITH CONTRAST TECHNIQUE: Multidetector CT imaging of the abdomen and pelvis was performed using the standard  protocol following bolus administration of intravenous contrast. CONTRAST:  19mL OMNIPAQUE IOHEXOL 300 MG/ML  SOLN COMPARISON:  10/30/2018 FINDINGS: Lower chest: Moderate sized type 3 hiatal hernia. Stable mild scarring medially in the right lower lobe. Hepatobiliary: 0.4 cm hypodense lesion in the right hepatic lobe on image 32 series 2 is roughly stable from 2019 and considered benign. 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 The gallbladder appears normal. No biliary dilatation. Pancreas: Unremarkable Spleen: Unremarkable Adrenals/Urinary Tract: Unremarkable Stomach/Bowel: Moderate-sized type 3 hiatal hernia. Scattered colonic diverticula. Normal appendix. Sigmoid colon diverticulosis. Vascular/Lymphatic: Unremarkable Reproductive: Unremarkable Other: No supplemental non-categorized findings. Musculoskeletal: Unremarkable IMPRESSION: 1. A specific cause for the patient's left upper quadrant abdominal pain is not identified. 2. There is a moderate sized type 3 hiatal hernia. 3. Sigmoid diverticulosis with scattered diverticula elsewhere in the colon, but without findings of active diverticulitis Electronically Signed   By: Van Clines M.D.   On: 02/23/2021 17:53   CT HEAD CODE STROKE WO CONTRAST  Result Date: 02/23/2021 CLINICAL DATA:  Code stroke. Neuro deficit, acute, stroke suspected. Additional history provided: Abdominal pain and right hand tingling as well as left-sided facial numbness. EXAM: CT HEAD WITHOUT CONTRAST TECHNIQUE: Contiguous axial images were obtained from the base of the skull through the vertex without intravenous contrast. COMPARISON:  No pertinent prior exams available for comparison. FINDINGS: Brain: Cerebral volume is normal. There is no acute intracranial hemorrhage. No demarcated cortical infarct. No extra-axial fluid collection. No evidence  of intracranial mass. No midline shift. Vascular: No hyperdense vessel. Skull: Normal. Negative for fracture or focal lesion. Sinuses/Orbits: Visualized orbits show no acute finding. Small left maxillary sinus mucous retention cyst. ASPECTS ([place] Stroke Program Early CT Score) - Ganglionic level infarction (caudate, lentiform nuclei, internal capsule, insula, M1-M3 cortex): 7 - Supraganglionic infarction (M4-M6 cortex): 3 Total score (0-10 with 10 being normal): 10 These results were called by telephone at the time of interpretation on 02/23/2021 at 3:03 pm to provider Dr. Roderic Palau, who verbally acknowledged these results. IMPRESSION: No evidence of acute intracranial abnormality. ASEPCTS is 10. Electronically Signed   By: Kellie Simmering DO   On: 02/23/2021 15:04    Scheduled Meds: . heparin  5,000 Units Subcutaneous Q8H  . pantoprazole  40 mg Oral Daily  . predniSONE  60 mg Oral Q breakfast  . valACYclovir  1,000 mg Oral Daily    Continuous Infusions: . sodium chloride 50 mL/hr at 02/23/21 1902  . sodium chloride 100 mL/hr at 02/23/21 1708     LOS: 0 days     Cristal Deer, MD [facility]  To reach me or the doctor on call, go to: [email or web address] Password [facility]  02/24/2021, 8:48 AM

## 2021-02-24 NOTE — Progress Notes (Signed)
*  PRELIMINARY RESULTS* Echocardiogram 2D Echocardiogram has been performed.  Samuel Germany 02/24/2021, 11:08 AM

## 2021-02-25 ENCOUNTER — Encounter (HOSPITAL_COMMUNITY): Payer: Self-pay | Admitting: Internal Medicine

## 2021-02-25 DIAGNOSIS — R29818 Other symptoms and signs involving the nervous system: Secondary | ICD-10-CM | POA: Diagnosis not present

## 2021-02-25 LAB — CREATININE, SERUM
Creatinine, Ser: 1.44 mg/dL — ABNORMAL HIGH (ref 0.61–1.24)
GFR, Estimated: >60 mL/min (ref >60)

## 2021-02-25 MED ORDER — PREDNISONE 20 MG PO TABS: 20.0000 mg | ORAL_TABLET | Freq: Every day | ORAL | 0 refills | Status: AC

## 2021-02-25 MED ORDER — VALACYCLOVIR HCL 1 G PO TABS: 1000.0000 mg | ORAL_TABLET | Freq: Every day | ORAL | 0 refills | Status: AC

## 2021-02-25 MED ORDER — HYDROXYZINE HCL 10 MG PO TABS: 10.0000 mg | ORAL_TABLET | Freq: Three times a day (TID) | ORAL | 0 refills | Status: DC | PRN

## 2021-02-25 NOTE — Progress Notes (Signed)
Patients mother at bedside, no signs or symptoms of distress will continue to monitor

## 2021-02-25 NOTE — Discharge Summary (Signed)
Discharge Summary  Elijah Shaffer ZOX:[id number] DOB: 05/29/1981  PCP: Jani Gravel, MD  Admit date: 02/23/2021 Discharge date: 02/25/2021  Time spent: 30 minutes  Recommendations for Outpatient Follow-up:  1. Primary care provider in 1 week  Discharge Diagnoses:  Active Hospital Problems   Diagnosis Date Noted  . 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 Essential hypertension 02/23/2021  . GERD (gastroesophageal reflux disease) 01/08/2018  . Diverticulitis of colon 09/15/2017  . Acute renal injury ([place]) 06/28/2017    Resolved Hospital Problems  No resolved problems to display.    Discharge Condition: Improved  Diet recommendation: Regular  Vitals:   02/25/21 0547 02/25/21 0756  BP: 119/64 (!) 134/95  Pulse: 65 65  Resp: 16 18  Temp: 97.6 F (36.4 C) 98.4 F (36.9 C)  SpO2: 98% 96%    History of present illness:  Patient was admitted for left facial droop.telemetry neurology was consulted and he has had MRI and MRA done 2D echo CT scan of the brain and carotid Doppler..   All were nonrevealing Hospital Course:  Principal Problem:   Focal neurological deficit Active Problems:   Acute renal injury ([place])   Diverticulitis of colon   GERD (gastroesophageal reflux disease)   Essential hypertension Patient was admitted for focal neurologic deficit with possible transient ischemic attack symptoms versus Bell palsy.  He obtained neurology evaluation through teleneurology.  His symptoms are resolved he was started on Plavix and aspirin He had  PT and OT evaluation with no recommendations. Hypertension.  Due to his stroke/TIA we are allowing permissive blood pressure Patient was started on empiric acyclovir for possible Bell's palsy   Anxiety he was started on hydroxyzine to take as needed GERD started him pantoprazole AKI.  His creatinine was mildly elevated he has been advised to increase fluid intake and to follow-up with his primary care doctor to monitor the  creatinine level Hypokalemia which was repleted we will recheck his potassium level Abdominal pain nausea vomiting resolved  Procedures:  Carotid Doppler normal  2D echo normal  CT of the brain normal  MR angiogram of the brain  Brain MRI  Consultations:  Teleneurology  Discharge Exam: BP (!) 134/95 (BP Location: Left Arm)   Pulse 65   Temp 98.4 F (36.9 C) (Oral)   Resp 18   Ht 6\' 3"  (1.905 m)   Wt 111.1 kg   SpO2 96%   BMI 30.62 kg/m   General: Alert oriented x3 has a little bit of worried look on his face stated that he has been under a lot of stress at work Cardiovascular: Auscultation bilaterally Respiratory: CTA bilaterally Neurology: No focal deficit  Discharge Instructions You were cared for by a hospitalist during your hospital stay. If you have any questions about your discharge medications or the care you received while you were in the hospital after you are discharged, you can call the unit and asked to speak with the hospitalist on call if the hospitalist that took care of you is not available. Once you are discharged, your primary care physician will handle any further medical issues. Please note that NO REFILLS for any discharge medications will be authorized once you are discharged, as it is imperative that you return to your primary care physician (or establish a relationship with a primary care physician if you do not have one) for your aftercare needs so that they can reassess your need for medications and monitor your lab values.  Discharge Instructions    Call MD  for:  difficulty breathing, headache or visual disturbances   Complete by: As directed    Call MD for:  persistant dizziness or light-headedness   Complete by: As directed    Call MD for:  persistant nausea and vomiting   Complete by: As directed    Diet - low sodium heart healthy   Complete by: As directed    Discharge instructions   Complete by: As directed    Follow up with pcp in 1  week   Increase activity slowly   Complete by: As directed      Allergies as of 02/25/2021      Reactions   Other Other (See Comments)   seeds   Claritin [loratadine] Other (See Comments)   Heart race      Medication List    TAKE these medications   acetaminophen 500 MG tablet Commonly known as: TYLENOL Take 500 mg by mouth every 6 (six) hours as needed.   cetirizine 10 MG tablet Commonly known as: ZYRTEC Take 10 mg by mouth daily.   docusate sodium 100 MG capsule Commonly known as: COLACE Take 100 mg by mouth daily.   esomeprazole 40 MG capsule Commonly known as: NEXIUM Take 40 mg by mouth daily as needed (reflux).   hydrOXYzine 10 MG tablet Commonly known as: ATARAX/VISTARIL Take 1 tablet (10 mg total) by mouth 3 (three) times daily as needed for anxiety. What changed:   medication strength  how much to take  when to take this  reasons to take this   predniSONE 20 MG tablet Commonly known as: DELTASONE Take 1 tablet (20 mg total) by mouth daily with breakfast for 2 days. Start taking on: February 26, 2021   triamcinolone ointment 0.5 % Commonly known as: KENALOG Apply 1 application topically daily as needed (itching).   valACYclovir 1000 MG tablet Commonly known as: VALTREX Take 1 tablet (1,000 mg total) by mouth daily for 7 days. Start taking on: February 26, 2021      Allergies  Allergen Reactions  . Other Other (See Comments)    seeds  . Claritin [Loratadine] Other (See Comments)    Heart race      The results of significant diagnostics from this hospitalization (including imaging, microbiology, ancillary and laboratory) are listed below for reference.    Significant Diagnostic Studies: MR ANGIO HEAD WO CONTRAST  Result Date: 02/23/2021 CLINICAL DATA:  Facial droop and numbness, and now largely resolved. EXAM: MRI HEAD WITHOUT CONTRAST MRA HEAD WITHOUT CONTRAST TECHNIQUE: Multiplanar, multiecho pulse sequences of the brain and surrounding  structures were obtained without intravenous contrast. Angiographic images of the head were obtained using MRA technique without contrast. COMPARISON:  Head CT 02/23/2021 FINDINGS: MRI HEAD FINDINGS Brain: There is no evidence of an acute infarct, intracranial hemorrhage, mass, midline shift, or extra-axial fluid collection. The ventricles and sulci are normal. The brain is normal in signal. The cerebellar tonsils are normally positioned. Vascular: Major intracranial vascular flow voids are preserved. Skull and upper cervical spine: Unremarkable bone marrow signal. Sinuses/Orbits: Unremarkable orbits. Small mucous retention cysts in the maxillary sinuses. Clear mastoid air cells. Other: None. MRA HEAD FINDINGS The visualized distal vertebral arteries are patent to the basilar with the right being strongly dominant. Patent right PICA, bilateral AICAs, and bilateral SCAs are visualized. The basilar artery is widely patent. Posterior communicating arteries are not identified and may be diminutive or absent. Both PCAs are patent without evidence of significant proximal stenosis. The internal carotid arteries are  widely patent from skull base to carotid termini. ACAs and MCAs are patent without evidence of a proximal branch occlusion or significant proximal stenosis. No aneurysm is identified. IMPRESSION: 1. Unremarkable appearance of the brain. 2. Negative head MRA. Electronically Signed   By: Logan Bores M.D.   On: 02/23/2021 16:39   MR Brain Wo Contrast (neuro protocol)  Result Date: 02/23/2021 CLINICAL DATA:  Facial droop and numbness, and now largely resolved. EXAM: MRI HEAD WITHOUT CONTRAST MRA HEAD WITHOUT CONTRAST TECHNIQUE: Multiplanar, multiecho pulse sequences of the brain and surrounding structures were obtained without intravenous contrast. Angiographic images of the head were obtained using MRA technique without contrast. COMPARISON:  Head CT 02/23/2021 FINDINGS: MRI HEAD FINDINGS Brain: There is no  evidence of an acute infarct, intracranial hemorrhage, mass, midline shift, or extra-axial fluid collection. The ventricles and sulci are normal. The brain is normal in signal. The cerebellar tonsils are normally positioned. Vascular: Major intracranial vascular flow voids are preserved. Skull and upper cervical spine: Unremarkable bone marrow signal. Sinuses/Orbits: Unremarkable orbits. Small mucous retention cysts in the maxillary sinuses. Clear mastoid air cells. Other: None. MRA HEAD FINDINGS The visualized distal vertebral arteries are patent to the basilar with the right being strongly dominant. Patent right PICA, bilateral AICAs, and bilateral SCAs are visualized. The basilar artery is widely patent. Posterior communicating arteries are not identified and may be diminutive or absent. Both PCAs are patent without evidence of significant proximal stenosis. The internal carotid arteries are widely patent from skull base to carotid termini. ACAs and MCAs are patent without evidence of a proximal branch occlusion or significant proximal stenosis. No aneurysm is identified. IMPRESSION: 1. Unremarkable appearance of the brain. 2. Negative head MRA. Electronically Signed   By: Logan Bores M.D.   On: 02/23/2021 16:39   CT ABDOMEN PELVIS W CONTRAST  Result Date: 02/23/2021 CLINICAL DATA:  Left upper quadrant abdominal pain EXAM: CT ABDOMEN AND PELVIS WITH CONTRAST TECHNIQUE: Multidetector CT imaging of the abdomen and pelvis was performed using the standard protocol following bolus administration of intravenous contrast. CONTRAST:  159mL OMNIPAQUE IOHEXOL 300 MG/ML  SOLN COMPARISON:  10/30/2018 FINDINGS: Lower chest: Moderate sized type 3 hiatal hernia. Stable mild scarring medially in the right lower lobe. Hepatobiliary: 0.4 cm hypodense lesion in the right hepatic lobe on image 32 series 2 is roughly stable from 2019 and considered benign. 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 The gallbladder appears normal. No biliary dilatation. Pancreas: Unremarkable Spleen: Unremarkable Adrenals/Urinary Tract: Unremarkable Stomach/Bowel: Moderate-sized type 3 hiatal hernia. Scattered colonic diverticula. Normal appendix. Sigmoid colon diverticulosis. Vascular/Lymphatic: Unremarkable Reproductive: Unremarkable Other: No supplemental non-categorized findings. Musculoskeletal: Unremarkable IMPRESSION: 1. A specific cause for the patient's left upper quadrant abdominal pain is not identified. 2. There is a moderate sized type 3 hiatal hernia. 3. Sigmoid diverticulosis with scattered diverticula elsewhere in the colon, but without findings of active diverticulitis Electronically Signed   By: Van Clines M.D.   On: 02/23/2021 17:53   US Carotid Bilateral (at [facility] and [facility] only)  Result Date: 02/24/2021 CLINICAL DATA:  40 year old with right hand tingling. Left facial numbness. EXAM: BILATERAL CAROTID DUPLEX ULTRASOUND TECHNIQUE: Pearline Cables scale imaging, color Doppler and duplex ultrasound were performed of bilateral carotid and vertebral arteries in the neck. COMPARISON:  Brain MRI 02/23/2021 FINDINGS: Criteria: Quantification of carotid stenosis is based on velocity parameters that  correlate the residual internal carotid diameter with NASCET-based stenosis levels, using the diameter of the distal internal carotid lumen as the denominator for stenosis measurement. The following velocity measurements were obtained: RIGHT ICA: 68/28 cm/sec CCA: 678/93 cm/sec SYSTOLIC ICA/CCA RATIO:  0.4 ECA: 128 cm/sec LEFT ICA: 76/15 cm/sec CCA: 810/17 cm/sec SYSTOLIC ICA/CCA RATIO:  0.6 ECA: 104 cm/sec RIGHT CAROTID ARTERY: Right carotid arteries are patent without significant plaque or stenosis. Normal waveforms and velocities in the internal carotid artery. External carotid artery is patent with normal waveform.  RIGHT VERTEBRAL ARTERY: Antegrade flow and normal waveform in the right vertebral artery. LEFT CAROTID ARTERY: Left carotid arteries are patent without significant plaque or stenosis. External carotid artery is patent with normal waveform. Normal waveforms and velocities in the internal carotid artery. LEFT VERTEBRAL ARTERY: Antegrade flow and normal waveform in the left vertebral artery. IMPRESSION: Normal carotid ultrasound. Bilateral carotid arteries are patent without significant plaque or stenosis. Electronically Signed   By: Markus Daft M.D.   On: 02/24/2021 12:01   ECHOCARDIOGRAM COMPLETE  Result Date: 02/24/2021    ECHOCARDIOGRAM REPORT   Patient Name:   Elijah Shaffer Date of Exam: 02/24/2021 Medical Rec #:  [id number]      Height:       75.0 in Accession #:    [id number]     Weight:       245.0 lb Date of Birth:  19-Sep-1981      BSA:          2.392 m Patient Age:    4 years       BP:           147/91 mmHg Patient Gender: M              HR:           63 bpm. Exam Location:  [facility] Procedure: 2D Echo, Cardiac Doppler and Color Doppler Indications:    TIA G45.9  History:        Patient has no prior history of Echocardiogram examinations.                 Risk Factors:Hypertension. GERD.  Sonographer:    Alvino Chapel RCS Referring Phys: [place]  1. Left ventricular ejection fraction, by estimation, is 60 to 65%. The left ventricle has normal function. The left ventricle has no regional wall motion abnormalities. Left ventricular diastolic parameters were normal.  2. Right ventricular systolic function is normal. The right ventricular size is mildly enlarged.  3. The mitral valve is normal in structure. No evidence of mitral valve regurgitation.  4. The aortic valve is tricuspid. Aortic valve regurgitation is not visualized. No aortic stenosis is present.  5. Aortic Root at upper limit of normal for age and BSA. Aortic dilatation noted. There is borderline dilatation of the  aortic root, measuring 39 mm. Comparison(s): No prior Echocardiogram. FINDINGS  Left Ventricle: Left ventricular ejection fraction, by estimation, is 60 to 65%. The left ventricle has normal function. The left ventricle has no regional wall motion abnormalities. The left ventricular internal cavity size was normal in size. There is  no left ventricular hypertrophy. Left ventricular diastolic parameters were normal. Right Ventricle: The right ventricular size is mildly enlarged. No increase in right ventricular wall thickness. Right ventricular systolic function is normal. Left Atrium: Left atrial size was normal in size. Right Atrium: Right atrial size was normal in size. Pericardium: There is no evidence of pericardial effusion.  Mitral Valve: The mitral valve is normal in structure. No evidence of mitral valve regurgitation. Tricuspid Valve: The tricuspid valve is normal in structure. Tricuspid valve regurgitation is trivial. No evidence of tricuspid stenosis. Aortic Valve: The aortic valve is tricuspid. Aortic valve regurgitation is not visualized. No aortic stenosis is present. Pulmonic Valve: The pulmonic valve was normal in structure. Pulmonic valve regurgitation is not visualized. No evidence of pulmonic stenosis. Aorta: Aortic Root at upper limit of normal for age and BSA. Aortic dilatation noted. There is borderline dilatation of the aortic root, measuring 39 mm. Pulmonary Artery: The pulmonary artery is of normal size. IAS/Shunts: The atrial septum is grossly normal.  LEFT VENTRICLE PLAX 2D LVIDd:         4.40 cm  Diastology LVIDs:         2.70 cm  LV e' medial:    7.94 cm/s LV PW:         0.90 cm  LV E/e' medial:  9.3 LV IVS:        0.90 cm  LV e' lateral:   10.00 cm/s LVOT diam:     2.10 cm  LV E/e' lateral: 7.4 LV SV:         76 LV SV Index:   32 LVOT Area:     3.46 cm  RIGHT VENTRICLE RV S prime:     11.60 cm/s TAPSE (M-mode): 2.0 cm LEFT ATRIUM             Index       RIGHT ATRIUM           Index LA  diam:        2.60 cm 1.09 cm/m  RA Area:     15.10 cm LA Vol (A2C):   38.8 ml 16.22 ml/m RA Volume:   37.10 ml  15.51 ml/m LA Vol (A4C):   64.3 ml 26.88 ml/m LA Biplane Vol: 52.6 ml 21.99 ml/m  AORTIC VALVE LVOT Vmax:   120.00 cm/s LVOT Vmean:  77.800 cm/s LVOT VTI:    0.220 m  AORTA Ao Root diam: 3.90 cm MITRAL VALVE MV Area (PHT): 3.28 cm    SHUNTS MV Decel Time: 231 msec    Systemic VTI:  0.22 m MV E velocity: 74.00 cm/s  Systemic Diam: 2.10 cm MV A velocity: 66.20 cm/s MV E/A ratio:  1.12 Rudean Haskell MD Electronically signed by Rudean Haskell MD Signature Date/Time: 02/24/2021/12:32:56 PM    Final    CT HEAD CODE STROKE WO CONTRAST  Result Date: 02/23/2021 CLINICAL DATA:  Code stroke. Neuro deficit, acute, stroke suspected. Additional history provided: Abdominal pain and right hand tingling as well as left-sided facial numbness. EXAM: CT HEAD WITHOUT CONTRAST TECHNIQUE: Contiguous axial images were obtained from the base of the skull through the vertex without intravenous contrast. COMPARISON:  No pertinent prior exams available for comparison. FINDINGS: Brain: Cerebral volume is normal. There is no acute intracranial hemorrhage. No demarcated cortical infarct. No extra-axial fluid collection. No evidence of intracranial mass. No midline shift. Vascular: No hyperdense vessel. Skull: Normal. Negative for fracture or focal lesion. Sinuses/Orbits: Visualized orbits show no acute finding. Small left maxillary sinus mucous retention cyst. ASPECTS ([place] Stroke Program Early CT Score) - Ganglionic level infarction (caudate, lentiform nuclei, internal capsule, insula, M1-M3 cortex): 7 - Supraganglionic infarction (M4-M6 cortex): 3 Total score (0-10 with 10 being normal): 10 These results were called by telephone at the time of interpretation on 02/23/2021 at 3:03 pm to provider Dr.  Zammit, who verbally acknowledged these results. IMPRESSION: No evidence of acute intracranial abnormality.  ASEPCTS is 10. Electronically Signed   By: Kellie Simmering DO   On: 02/23/2021 15:04    Microbiology: Recent Results (from the past 240 hour(s))  Resp Panel by RT-PCR (Flu A&B, Covid) Nasopharyngeal Swab     Status: None   Collection Time: 02/23/21  3:18 PM   Specimen: Nasopharyngeal Swab; Nasopharyngeal(NP) swabs in vial transport medium  Result Value Ref Range Status   SARS Coronavirus 2 by RT PCR NEGATIVE NEGATIVE Final    Comment: (NOTE) SARS-CoV-2 target nucleic acids are NOT DETECTED.  The SARS-CoV-2 RNA is generally detectable in upper respiratory specimens during the acute phase of infection. The lowest concentration of SARS-CoV-2 viral copies this assay can detect is 138 copies/mL. A negative result does not preclude SARS-Cov-2 infection and should not be used as the sole basis for treatment or other patient management decisions. A negative result may occur with  improper specimen collection/handling, submission of specimen other than nasopharyngeal swab, presence of viral mutation(s) within the areas targeted by this assay, and inadequate number of viral copies(<138 copies/mL). A negative result must be combined with clinical observations, patient history, and epidemiological information. The expected result is Negative.  Fact Sheet for Patients:  EntrepreneurPulse.com.au  Fact Sheet for Healthcare Providers:  IncredibleEmployment.be  This test is no t yet approved or cleared by the [place] FDA and  has been authorized for detection and/or diagnosis of SARS-CoV-2 by FDA under an Emergency Use Authorization (EUA). This EUA will remain  in effect (meaning this test can be used) for the duration of the COVID-19 declaration under Section 564(b)(1) of the Act, 21 U.S.C.section 360bbb-3(b)(1), unless the authorization is terminated  or revoked sooner.       Influenza A by PCR NEGATIVE NEGATIVE Final   Influenza B by PCR NEGATIVE  NEGATIVE Final    Comment: (NOTE) The Xpert Xpress SARS-CoV-2/FLU/RSV plus assay is intended as an aid in the diagnosis of influenza from Nasopharyngeal swab specimens and should not be used as a sole basis for treatment. Nasal washings and aspirates are unacceptable for Xpert Xpress SARS-CoV-2/FLU/RSV testing.  Fact Sheet for Patients: EntrepreneurPulse.com.au  Fact Sheet for Healthcare Providers: IncredibleEmployment.be  This test is not yet approved or cleared by the [place] FDA and has been authorized for detection and/or diagnosis of SARS-CoV-2 by FDA under an Emergency Use Authorization (EUA). This EUA will remain in effect (meaning this test can be used) for the duration of the COVID-19 declaration under Section 564(b)(1) of the Act, 21 U.S.C. section 360bbb-3(b)(1), unless the authorization is terminated or revoked.  Performed at [facility], [address] [place], [place] [address]      Labs: Basic Metabolic Panel: Recent Labs  Lab 02/23/21 1430 02/23/21 1439 02/24/21 2026  NA 138 143 134*  K 3.3* 3.4* 3.7  CL 106 106 104  CO2 22  --  22  GLUCOSE 80 79 142*  BUN 13 12 17   CREATININE 1.40* 1.50* 1.37*  CALCIUM 9.6  --  8.9   Liver Function Tests: Recent Labs  Lab 02/23/21 1430  AST 19  ALT 20  ALKPHOS 60  BILITOT 1.2  PROT 7.9  ALBUMIN 4.9   Recent Labs  Lab 02/23/21 1430  LIPASE 38   No results for input(s): AMMONIA in the last 168 hours. CBC: Recent Labs  Lab 02/23/21 1430 02/23/21 1439  WBC 12.7*  --   NEUTROABS 8.2*  --  HGB 16.7 16.7  HCT 48.9 49.0  MCV 89.2  --   PLT 280  --    Cardiac Enzymes: Recent Labs  Lab 02/23/21 1430  CKTOTAL 160   BNP: BNP (last 3 results) No results for input(s): BNP in the last 8760 hours.  ProBNP (last 3 results) No results for input(s): PROBNP in the last 8760 hours.  CBG: Recent Labs  Lab 02/23/21 1436 02/24/21 1517  GLUCAP 77 110*        Signed:  Cristal Deer, MD [facility] 02/25/2021, 10:10 AM

## 2021-02-26 LAB — HEMOGLOBIN A1C
Hgb A1c MFr Bld: 5.4 % (ref 4.8–5.6)
Mean Plasma Glucose: 108 mg/dL

## 2021-03-05 ENCOUNTER — Ambulatory Visit
Admission: EM | Admit: 2021-03-05 | Discharge: 2021-03-05 | Disposition: A | Discharge status: Home / Self Care | Payer: 59 | Attending: Family Medicine | Admitting: Family Medicine

## 2021-03-05 ENCOUNTER — Telehealth (INDEPENDENT_AMBULATORY_CARE_PROVIDER_SITE_OTHER): Payer: Self-pay | Admitting: Internal Medicine

## 2021-03-05 ENCOUNTER — Other Ambulatory Visit: Payer: Self-pay

## 2021-03-05 ENCOUNTER — Encounter: Payer: Self-pay | Admitting: Emergency Medicine

## 2021-03-05 DIAGNOSIS — R071 Chest pain on breathing: Secondary | ICD-10-CM

## 2021-03-05 DIAGNOSIS — R0981 Nasal congestion: Secondary | ICD-10-CM

## 2021-03-05 DIAGNOSIS — R059 Cough, unspecified: Secondary | ICD-10-CM

## 2021-03-05 HISTORY — DX: Anxiety disorder, unspecified: F41.9

## 2021-03-05 MED ORDER — FLUTICASONE PROPIONATE 50 MCG/ACT NA SUSP: 2.0000 | Freq: Every day | NASAL | 2 refills | Status: DC

## 2021-03-05 MED ORDER — PROMETHAZINE-DM 6.25-15 MG/5ML PO SYRP: 5.0000 mL | ORAL_SOLUTION | Freq: Three times a day (TID) | ORAL | 0 refills | Status: DC | PRN

## 2021-03-05 NOTE — Discharge Instructions (Signed)
Keep follow-up PCP.  Start allergy regimen as prescribed, For acute anxiety continue Atarax

## 2021-03-05 NOTE — Telephone Encounter (Signed)
Patient mother called stated patient is having abdominal pain and a lot of burping - wants to know if there is any medication that can be sent to his pharmacy - please advise - ph# [phone] or [phone]

## 2021-03-05 NOTE — Telephone Encounter (Signed)
I called both numbers left a vm on the cell. I called the home number and spoke with Leory Plowman whom states the patient is gone to urgent care currently, he advised I call the patients cell number.

## 2021-03-05 NOTE — ED Triage Notes (Signed)
Pt states he woke up in the middle of the night and fells like he stopped breathing.  Pt states he became anxious and felt like his heart was beating fast. Took an anxiety med he got from the hospital and felt better.

## 2021-03-05 NOTE — ED Provider Notes (Signed)
[facility]    CSN: [id number] Arrival date & time: 03/05/21  5277      History   Chief Complaint Chief Complaint  Patient presents with  . Nasal Congestion    HPI Elijah Shaffer is a 40 y.o. male.   HPI  Patient presents with sensation of loss of breath and chest palpitations which awaken in the middle of night. He took a dose his anxiety medication which seemed to calm symptoms. He has noticed cough has caused chest discomfort and he is experiencing nasal symptoms of congestion and post nasal drainage. Takes Zrytec and benadryl. No fever. No productive cough.  Past Medical History:  Diagnosis Date  . Acute renal injury ([place]) 06/28/2017  . Anxiety   . Diverticulitis   . GERD (gastroesophageal reflux disease) 01/08/2018  . Seasonal allergies     Patient Active Problem List   Diagnosis Date Noted  . Essential hypertension 02/23/2021  . 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 GERD (gastroesophageal reflux disease) 01/08/2018  . Diverticulitis of colon 09/15/2017  . Sigmoid diverticulitis   . Acute renal injury ([place]) 06/28/2017    Past Surgical History:  Procedure Laterality Date  . COLONOSCOPY N/A 09/22/2017   Procedure: COLONOSCOPY;  Surgeon: Rogene Houston, MD;  [facility];  Service: Endoscopy;  Laterality: N/A;  9:55  . POLYPECTOMY  09/22/2017   Procedure: POLYPECTOMY;  Surgeon: Rogene Houston, MD;  Location: [facility];  Service: Endoscopy;;       Home Medications    Prior to Admission medications   Medication Sig Start Date End Date Taking? Authorizing Provider  acetaminophen (TYLENOL) 500 MG tablet Take 500 mg by mouth every 6 (six) hours as needed.    [provider]  cetirizine (ZYRTEC) 10 MG tablet Take 10 mg by mouth daily.    [provider]  docusate sodium (COLACE) 100 MG capsule Take 100 mg by mouth daily.    [provider]  esomeprazole (NEXIUM) 40 MG capsule Take 40 mg by mouth daily as  needed (reflux).    [provider]  hydrOXYzine (ATARAX/VISTARIL) 10 MG tablet Take 1 tablet (10 mg total) by mouth 3 (three) times daily as needed for anxiety. 02/25/21   Cristal Deer, MD  triamcinolone ointment (KENALOG) 0.5 % Apply 1 application topically daily as needed (itching). 12/06/19   [provider]  valACYclovir (VALTREX) 1000 MG tablet Take 1 tablet (1,000 mg total) by mouth daily for 7 days. 02/26/21 03/05/21  Cristal Deer, MD    Family History Family History  Problem Relation Age of Onset  . Colon cancer Maternal Grandmother   . Colon cancer Maternal Grandfather   . Healthy Mother   . Healthy Father     Social History Social History   Tobacco Use  . Smoking status: Never Smoker  . Smokeless tobacco: Never Used  Vaping Use  . Vaping Use: Never used  Substance Use Topics  . Alcohol use: Yes    Comment: occasional  . Drug use: No     Allergies   Other and Claritin [loratadine]   Review of Systems Review of Systems Pertinent negatives listed in HPI   Physical Exam Triage Vital Signs ED Triage Vitals  Enc Vitals Group     BP 03/05/21 1039 123/84     Pulse Rate 03/05/21 1039 77     Resp 03/05/21 1039 18     Temp 03/05/21 1039 98.2 F (36.8 C)     Temp  Source 03/05/21 1039 Oral     SpO2 03/05/21 1039 96 %     Weight --      Height --      Head Circumference --      Peak Flow --      Pain Score 03/05/21 1038 0     Pain Loc --      Pain Edu? --      Excl. in [place]? --    No data found.  Updated Vital Signs BP 123/84 (BP Location: Right Arm)   Pulse 77   Temp 98.2 F (36.8 C) (Oral)   Resp 18   SpO2 96%   Visual Acuity Right Eye Distance:   Left Eye Distance:   Bilateral Distance:    Right Eye Near:   Left Eye Near:    Bilateral Near:     Physical Exam General appearance: Alert, well developed, well nourished, cooperative  Head: Normocephalic, without obvious abnormality, atraumatic ENT: nasal congestion  present  Respiratory: Respirations even and unlabored, normal respiratory rate Heart: rate and rhythm normal. No gallop or murmurs noted on exam  Abdomen: BS +, no distention, no rebound tenderness, or no mass Extremities: No gross deformities Skin: Skin color, texture, turgor normal. No rashes seen  Psych: Appropriate flat affect with anxious mood Neurologic: GCS 15, normal coordination, normal gait UC Treatments / Results  Labs (all labs ordered are listed, but only abnormal results are displayed) Labs Reviewed - No data to display  EKG   Radiology  Procedures Procedures (including critical care time)  Medications Ordered in UC Medications - No data to display  Initial Impression / Assessment and Plan / UC Course  I have reviewed the triage vital signs and the nursing notes.  Pertinent labs & imaging results that were available during my care of the patient were reviewed by me and considered in my medical decision making (see chart for details).   Asymptomatic at present, however, patient awakened with chest pain with breathing in today for evaluation. VSS. Patient endorses persistent anxiety suspect this may be contributory to symptoms experienced upon awakening this AM. URI symptoms management with Flonase and promethazine DM as needed. ER precautions given. PCP follow-up as needed. Final Clinical Impressions(s) / UC Diagnoses   Final diagnoses:  Chest pain on breathing  Nasal congestion  Coughing   Discharge Instructions   None    ED Prescriptions    Medication Sig Dispense Auth. Provider   fluticasone [facility] ALLERGY RELIEF) 50 MCG/ACT nasal spray Place 2 sprays into both nostrils daily. 16 g Scot Jun, FNP   promethazine-dextromethorphan (PROMETHAZINE-DM) 6.25-15 MG/5ML syrup Take 5 mLs by mouth 3 (three) times daily as needed for cough. 118 mL Scot Jun, FNP     PDMP not reviewed this encounter.   Scot Jun, [place] 03/08/21  [phone]

## 2021-03-05 NOTE — Telephone Encounter (Signed)
I called and left voice message if he still needs assistance from the office to return call.

## 2021-03-17 ENCOUNTER — Encounter (HOSPITAL_COMMUNITY): Payer: Self-pay

## 2021-03-17 ENCOUNTER — Emergency Department (HOSPITAL_COMMUNITY): Payer: 59

## 2021-03-17 ENCOUNTER — Observation Stay (HOSPITAL_COMMUNITY): Payer: 59

## 2021-03-17 ENCOUNTER — Inpatient Hospital Stay (HOSPITAL_COMMUNITY)
Admission: EM | Admit: 2021-03-17 | Discharge: 2021-03-20 | DRG: 377 | Disposition: A | Discharge status: Home / Self Care | Payer: 59 | Attending: Internal Medicine | Admitting: Internal Medicine

## 2021-03-17 ENCOUNTER — Other Ambulatory Visit: Payer: Self-pay

## 2021-03-17 DIAGNOSIS — R55 Syncope and collapse: Secondary | ICD-10-CM

## 2021-03-17 DIAGNOSIS — K920 Hematemesis: Secondary | ICD-10-CM | POA: Diagnosis not present

## 2021-03-17 DIAGNOSIS — K219 Gastro-esophageal reflux disease without esophagitis: Secondary | ICD-10-CM | POA: Diagnosis present

## 2021-03-17 DIAGNOSIS — N182 Chronic kidney disease, stage 2 (mild): Secondary | ICD-10-CM | POA: Diagnosis present

## 2021-03-17 DIAGNOSIS — N179 Acute kidney failure, unspecified: Secondary | ICD-10-CM | POA: Diagnosis present

## 2021-03-17 DIAGNOSIS — J302 Other seasonal allergic rhinitis: Secondary | ICD-10-CM | POA: Diagnosis present

## 2021-03-17 DIAGNOSIS — K449 Diaphragmatic hernia without obstruction or gangrene: Secondary | ICD-10-CM | POA: Diagnosis present

## 2021-03-17 DIAGNOSIS — E872 Acidosis: Secondary | ICD-10-CM | POA: Diagnosis not present

## 2021-03-17 DIAGNOSIS — Z888 Allergy status to other drugs, medicaments and biological substances status: Secondary | ICD-10-CM

## 2021-03-17 DIAGNOSIS — T17908A Unspecified foreign body in respiratory tract, part unspecified causing other injury, initial encounter: Secondary | ICD-10-CM

## 2021-03-17 DIAGNOSIS — Z79899 Other long term (current) drug therapy: Secondary | ICD-10-CM

## 2021-03-17 DIAGNOSIS — I129 Hypertensive chronic kidney disease with stage 1 through stage 4 chronic kidney disease, or unspecified chronic kidney disease: Secondary | ICD-10-CM | POA: Diagnosis present

## 2021-03-17 DIAGNOSIS — R739 Hyperglycemia, unspecified: Secondary | ICD-10-CM | POA: Diagnosis present

## 2021-03-17 DIAGNOSIS — R17 Unspecified jaundice: Secondary | ICD-10-CM | POA: Diagnosis not present

## 2021-03-17 DIAGNOSIS — F419 Anxiety disorder, unspecified: Secondary | ICD-10-CM | POA: Diagnosis present

## 2021-03-17 DIAGNOSIS — Z20822 Contact with and (suspected) exposure to covid-19: Secondary | ICD-10-CM | POA: Diagnosis present

## 2021-03-17 DIAGNOSIS — R0902 Hypoxemia: Secondary | ICD-10-CM | POA: Diagnosis present

## 2021-03-17 DIAGNOSIS — J690 Pneumonitis due to inhalation of food and vomit: Secondary | ICD-10-CM

## 2021-03-17 DIAGNOSIS — Z800 Family history of malignant neoplasm of digestive organs: Secondary | ICD-10-CM

## 2021-03-17 DIAGNOSIS — E876 Hypokalemia: Secondary | ICD-10-CM | POA: Diagnosis not present

## 2021-03-17 DIAGNOSIS — K922 Gastrointestinal hemorrhage, unspecified: Secondary | ICD-10-CM | POA: Diagnosis present

## 2021-03-17 LAB — RESP PANEL BY RT-PCR (FLU A&B, COVID) ARPGX2
Influenza A by PCR: NEGATIVE
Influenza B by PCR: NEGATIVE
SARS Coronavirus 2 by RT PCR: NEGATIVE

## 2021-03-17 LAB — URINALYSIS, ROUTINE W REFLEX MICROSCOPIC
Bilirubin Urine: NEGATIVE
Glucose, UA: NEGATIVE mg/dL
Ketones, ur: NEGATIVE mg/dL
Leukocytes,Ua: NEGATIVE
Nitrite: NEGATIVE
Protein, ur: 30 mg/dL — AB
Specific Gravity, Urine: 1.013 (ref 1.005–1.030)
pH: 6 (ref 5.0–8.0)

## 2021-03-17 LAB — CBC WITH DIFFERENTIAL/PLATELET
Abs Immature Granulocytes: 0.23 K/uL — ABNORMAL HIGH (ref 0.00–0.07)
Basophils Absolute: 0.1 K/uL (ref 0.0–0.1)
Basophils Relative: 1 %
Eosinophils Absolute: 0.1 K/uL (ref 0.0–0.5)
Eosinophils Relative: 1 %
HCT: 48.1 % (ref 39.0–52.0)
Hemoglobin: 16.2 g/dL (ref 13.0–17.0)
Immature Granulocytes: 2 %
Lymphocytes Relative: 36 %
Lymphs Abs: 3.9 K/uL (ref 0.7–4.0)
MCH: 30.7 pg (ref 26.0–34.0)
MCHC: 33.7 g/dL (ref 30.0–36.0)
MCV: 91.3 fL (ref 80.0–100.0)
Monocytes Absolute: 0.8 K/uL (ref 0.1–1.0)
Monocytes Relative: 8 %
Neutro Abs: 5.8 K/uL (ref 1.7–7.7)
Neutrophils Relative %: 52 %
Platelets: 308 K/uL (ref 150–400)
RBC: 5.27 MIL/uL (ref 4.22–5.81)
RDW: 12.8 % (ref 11.5–15.5)
WBC: 10.9 K/uL — ABNORMAL HIGH (ref 4.0–10.5)
nRBC: 0 % (ref 0.0–0.2)

## 2021-03-17 LAB — TSH: TSH: 1.394 u[IU]/mL (ref 0.350–4.500)

## 2021-03-17 LAB — COMPREHENSIVE METABOLIC PANEL WITH GFR
ALT: 34 U/L (ref 0–44)
AST: 25 U/L (ref 15–41)
Albumin: 4.5 g/dL (ref 3.5–5.0)
Alkaline Phosphatase: 60 U/L (ref 38–126)
Anion gap: 11 (ref 5–15)
BUN: 13 mg/dL (ref 6–20)
CO2: 19 mmol/L — ABNORMAL LOW (ref 22–32)
Calcium: 9.4 mg/dL (ref 8.9–10.3)
Chloride: 108 mmol/L (ref 98–111)
Creatinine, Ser: 1.4 mg/dL — ABNORMAL HIGH (ref 0.61–1.24)
GFR, Estimated: >60 mL/min (ref >60)
Glucose, Bld: 120 mg/dL — ABNORMAL HIGH (ref 70–99)
Potassium: 3.9 mmol/L (ref 3.5–5.1)
Sodium: 138 mmol/L (ref 135–145)
Total Bilirubin: 0.8 mg/dL (ref 0.3–1.2)
Total Protein: 7.3 g/dL (ref 6.5–8.1)

## 2021-03-17 LAB — POC OCCULT BLOOD, ED: Fecal Occult Bld: NEGATIVE

## 2021-03-17 LAB — RAPID URINE DRUG SCREEN, HOSP PERFORMED
Amphetamines: NOT DETECTED
Barbiturates: NOT DETECTED
Benzodiazepines: NOT DETECTED
Cocaine: NOT DETECTED
Opiates: NOT DETECTED
Tetrahydrocannabinol: NOT DETECTED

## 2021-03-17 LAB — TROPONIN I (HIGH SENSITIVITY)
Troponin I (High Sensitivity): 2 ng/L (ref <18)
Troponin I (High Sensitivity): 9 ng/L (ref <18)

## 2021-03-17 LAB — CBG MONITORING, ED: Glucose-Capillary: 105 mg/dL — ABNORMAL HIGH (ref 70–99)

## 2021-03-17 LAB — TYPE AND SCREEN
ABO/RH(D): O POS
Antibody Screen: NEGATIVE

## 2021-03-17 LAB — BRAIN NATRIURETIC PEPTIDE: B Natriuretic Peptide: 17 pg/mL (ref 0.0–100.0)

## 2021-03-17 LAB — CK: Total CK: 162 U/L (ref 49–397)

## 2021-03-17 LAB — LIPASE, BLOOD: Lipase: 33 U/L (ref 11–51)

## 2021-03-17 MED ORDER — HYDROXYZINE HCL 10 MG PO TABS: 10.0000 mg | ORAL_TABLET | Freq: Three times a day (TID) | ORAL | Status: DC | PRN

## 2021-03-17 MED ORDER — PANTOPRAZOLE SODIUM 40 MG IV SOLR: 40.0000 mg | Freq: Two times a day (BID) | INTRAVENOUS | Status: DC

## 2021-03-17 MED ORDER — SODIUM CHLORIDE 0.9% FLUSH
3.0000 mL | Freq: Two times a day (BID) | INTRAVENOUS | Status: DC
  Administered 2021-03-18 – 2021-03-20 (×3): 3 mL via INTRAVENOUS

## 2021-03-17 MED ORDER — ONDANSETRON HCL 4 MG/2ML IJ SOLN: 4.0000 mg | Freq: Four times a day (QID) | INTRAMUSCULAR | Status: DC | PRN

## 2021-03-17 MED ORDER — METOCLOPRAMIDE HCL 5 MG/ML IJ SOLN
10.0000 mg | Freq: Once | INTRAMUSCULAR | Status: AC
  Administered 2021-03-17: 10 mg via INTRAVENOUS
  Filled 2021-03-17: qty 2

## 2021-03-17 MED ORDER — SODIUM CHLORIDE 0.9 % IV SOLN
80.0000 mg | Freq: Once | INTRAVENOUS | Status: AC
  Administered 2021-03-17: 80 mg via INTRAVENOUS
  Filled 2021-03-17: qty 80

## 2021-03-17 MED ORDER — ACETAMINOPHEN 500 MG PO TABS
500.0000 mg | ORAL_TABLET | Freq: Four times a day (QID) | ORAL | Status: DC | PRN
  Administered 2021-03-17 – 2021-03-19 (×5): 500 mg via ORAL
  Filled 2021-03-17 (×5): qty 1

## 2021-03-17 MED ORDER — SODIUM CHLORIDE 0.9 % IV BOLUS
1000.0000 mL | Freq: Once | INTRAVENOUS | Status: AC
  Administered 2021-03-17: 1000 mL via INTRAVENOUS

## 2021-03-17 MED ORDER — SODIUM CHLORIDE 0.9 % IV SOLN
8.0000 mg/h | INTRAVENOUS | Status: DC
  Administered 2021-03-17 – 2021-03-19 (×4): 8 mg/h via INTRAVENOUS
  Filled 2021-03-17 (×8): qty 80

## 2021-03-17 MED ORDER — POLYETHYLENE GLYCOL 3350 17 G PO PACK: 17.0000 g | PACK | Freq: Every day | ORAL | Status: DC | PRN

## 2021-03-17 MED ORDER — OXYCODONE HCL 5 MG PO TABS: 5.0000 mg | ORAL_TABLET | ORAL | Status: DC | PRN

## 2021-03-17 MED ORDER — ONDANSETRON HCL 4 MG PO TABS
4.0000 mg | ORAL_TABLET | Freq: Four times a day (QID) | ORAL | Status: DC | PRN
  Administered 2021-03-19: 4 mg via ORAL
  Filled 2021-03-17: qty 1

## 2021-03-17 MED ORDER — BISACODYL 10 MG RE SUPP: 10.0000 mg | Freq: Every day | RECTAL | Status: DC | PRN

## 2021-03-17 MED ORDER — SODIUM CHLORIDE 0.9 % IV SOLN: INTRAVENOUS | Status: DC

## 2021-03-17 MED ORDER — FLUTICASONE PROPIONATE 50 MCG/ACT NA SUSP
2.0000 | Freq: Every day | NASAL | Status: DC
  Administered 2021-03-18 – 2021-03-20 (×3): 2 via NASAL
  Filled 2021-03-17 (×2): qty 16
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
## 2021-03-17 NOTE — ED Provider Notes (Addendum)
[facility] EMERGENCY DEPARTMENT Provider Note   CSN: [id number] Arrival date & time: 03/17/21  1141     History Chief Complaint  Patient presents with  . Loss of Consciousness    Elijah Shaffer is a 40 y.o. male.  Patient work as a Development worker, international aid.  Patient went back to his vehicle after picking up a bunch of branches to have some Gatorade.  He started to feel strange in the head.  And his vision got somewhat grade. He knew he had passed out.  He is had this feeling before but is never passed out.  When he came to multiple episodes of vomiting of coffee-ground emesis.  Past medical history significant for acute renal injury anxiety diverticulitis to esophageal reflux disease.  Patient is on Protonix.  Patient denies any chest pain abdominal pain or shortness of breath.  Nuys any blood in his bowel movements.  States he does take Motrin like medicine frequently.        Past Medical History:  Diagnosis Date  . Acute renal injury ([place]) 06/28/2017  . Anxiety   . Diverticulitis   . GERD (gastroesophageal reflux disease) 01/08/2018  . Seasonal allergies     Patient Active Problem List   Diagnosis Date Noted  . Essential hypertension 02/23/2021  . 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 GERD (gastroesophageal reflux disease) 01/08/2018  . Diverticulitis of colon 09/15/2017  . Sigmoid diverticulitis   . Acute renal injury ([place]) 06/28/2017    Past Surgical History:  Procedure Laterality Date  . COLONOSCOPY N/A 09/22/2017   Procedure: COLONOSCOPY;  Surgeon: Rogene Houston, MD;  Location: [facility];  Service: Endoscopy;  Laterality: N/A;  9:55  . POLYPECTOMY  09/22/2017   Procedure: POLYPECTOMY;  Surgeon: Rogene Houston, MD;  Location: [facility];  Service: Endoscopy;;       Family History  Problem Relation Age of Onset  . Colon cancer Maternal Grandmother   . Colon cancer Maternal Grandfather   . Healthy Mother   . Healthy Father     Social History    Tobacco Use  . Smoking status: Never Smoker  . Smokeless tobacco: Never Used  Vaping Use  . Vaping Use: Never used  Substance Use Topics  . Alcohol use: Yes    Comment: occasional  . Drug use: No    Home Medications Prior to Admission medications   Medication Sig Start Date End Date Taking? Authorizing Provider  acetaminophen (TYLENOL) 500 MG tablet Take 500 mg by mouth every 6 (six) hours as needed.    [provider]  cetirizine (ZYRTEC) 10 MG tablet Take 10 mg by mouth daily.    [provider]  docusate sodium (COLACE) 100 MG capsule Take 100 mg by mouth daily.    [provider]  esomeprazole (NEXIUM) 40 MG capsule Take 40 mg by mouth daily as needed (reflux).    [provider]  fluticasone (FLONASE ALLERGY RELIEF) 50 MCG/ACT nasal spray Place 2 sprays into both nostrils daily. 03/05/21   Scot Jun, FNP  hydrOXYzine (ATARAX/VISTARIL) 10 MG tablet Take 1 tablet (10 mg total) by mouth 3 (three) times daily as needed for anxiety. 02/25/21   Cristal Deer, MD  promethazine-dextromethorphan (PROMETHAZINE-DM) 6.25-15 MG/5ML syrup Take 5 mLs by mouth 3 (three) times daily as needed for cough. 03/05/21   Scot Jun, FNP  triamcinolone ointment (KENALOG) 0.5 % Apply 1 application topically daily as needed (itching). 12/06/19   [provider]  Allergies    Other and Claritin [loratadine]  Review of Systems   Review of Systems  Constitutional: Positive for diaphoresis. Negative for chills and fever.  HENT: Negative for rhinorrhea and sore throat.   Eyes: Negative for visual disturbance.  Respiratory: Negative for cough and shortness of breath.   Cardiovascular: Negative for chest pain and leg swelling.  Gastrointestinal: Positive for nausea and vomiting. Negative for abdominal pain and diarrhea.  Genitourinary: Negative for dysuria.  Musculoskeletal: Negative for back pain and neck pain.  Skin: Negative for rash.   Neurological: Positive for syncope. Negative for dizziness, light-headedness and headaches.  Hematological: Does not bruise/bleed easily.  Psychiatric/Behavioral: Negative for confusion.    Physical Exam Updated Vital Signs BP 125/82   Pulse 60   Temp 97.8 F (36.6 C) (Axillary)   Resp (!) 22   Ht 1.905 m (6\' 3" )   Wt 90.7 kg   SpO2 99%   BMI 25.00 kg/m   Physical Exam Vitals and nursing note reviewed.  Constitutional:      Appearance: He is well-developed. He is ill-appearing and diaphoretic.  HENT:     Head: Normocephalic and atraumatic.  Eyes:     Extraocular Movements: Extraocular movements intact.     Conjunctiva/sclera: Conjunctivae normal.     Pupils: Pupils are equal, round, and reactive to light.  Cardiovascular:     Rate and Rhythm: Normal rate and regular rhythm.     Heart sounds: No murmur heard.   Pulmonary:     Effort: Pulmonary effort is normal. No respiratory distress.     Breath sounds: Normal breath sounds.  Abdominal:     Palpations: Abdomen is soft.     Tenderness: There is no abdominal tenderness.  Genitourinary:    Comments: Hemoccult pending.  But stool was light brown Musculoskeletal:     Cervical back: Neck supple.  Skin:    General: Skin is warm.     Capillary Refill: Capillary refill takes less than 2 seconds.     Coloration: Skin is pale.  Neurological:     General: No focal deficit present.     Mental Status: He is alert and oriented to person, place, and time.     Cranial Nerves: No cranial nerve deficit.     Sensory: No sensory deficit.     Motor: No weakness.     ED Results / Procedures / Treatments   Labs (all labs ordered are listed, but only abnormal results are displayed) Labs Reviewed  COMPREHENSIVE METABOLIC PANEL - Abnormal; Notable for the following components:      Result Value   CO2 19 (*)    Glucose, Bld 120 (*)    Creatinine, Ser 1.40 (*)    All other components within normal limits  CBC WITH  DIFFERENTIAL/PLATELET - Abnormal; Notable for the following components:   WBC 10.9 (*)    Abs Immature Granulocytes 0.23 (*)    All other components within normal limits  CBG MONITORING, ED - Abnormal; Notable for the following components:   Glucose-Capillary 105 (*)    All other components within normal limits  LIPASE, BLOOD  BRAIN NATRIURETIC PEPTIDE  CK  URINALYSIS, ROUTINE W REFLEX MICROSCOPIC  POC OCCULT BLOOD, ED  TYPE AND SCREEN    EKG EKG Interpretation  Date/Time:  Saturday Mar 17 2021 12:07:03 EDT Ventricular Rate:  84 PR Interval:  171 QRS Duration: 101 QT Interval:  365 QTC Calculation: 432 R Axis:   44 Text Interpretation: Sinus rhythm Confirmed  by Fredia Sorrow [phone]) on 03/17/2021 12:13:37 PM   Radiology DG Chest Port 1 View  Result Date: 03/17/2021 CLINICAL DATA:  40 year old male with gastric test no hemorrhage. EXAM: PORTABLE CHEST - 1 VIEW COMPARISON:  04/30/2010 FINDINGS: The mediastinal contours are within normal limits. No cardiomegaly. Poor inspiratory effort. An azygos fissure is noted. The lungs are otherwise clear bilaterally without evidence of focal consolidation, pleural effusion, or pneumothorax. No acute osseous abnormality. IMPRESSION: Mild pulmonary vascular crowding due to poor inspiratory effort. No acute cardiopulmonary process. Electronically Signed   By: Ruthann Cancer MD   On: 03/17/2021 12:34    Procedures Procedures   CRITICAL CARE Performed by: Fredia Sorrow Total critical care time: 35 minutes Critical care time was exclusive of separately billable procedures and treating other patients. Critical care was necessary to treat or prevent imminent or life-threatening deterioration. Critical care was time spent personally by me on the following activities: development of treatment plan with patient and/or surrogate as well as nursing, discussions with consultants, evaluation of patient's response to treatment, examination of patient,  obtaining history from patient or surrogate, ordering and performing treatments and interventions, ordering and review of laboratory studies, ordering and review of radiographic studies, pulse oximetry and re-evaluation of patient's condition.   Medications Ordered in ED Medications  0.9 %  sodium chloride infusion (has no administration in time range)  pantoprazole (PROTONIX) 80 mg in sodium chloride 0.9 % 100 mL (0.8 mg/mL) infusion ( Intravenous Canceled Entry 03/17/21 1252)  pantoprazole (PROTONIX) injection 40 mg (has no administration in time range)  sodium chloride 0.9 % bolus 1,000 mL (1,000 mLs Intravenous New Bag/Given 03/17/21 1212)  pantoprazole (PROTONIX) 80 mg in sodium chloride 0.9 % 100 mL IVPB (80 mg Intravenous New Bag/Given 03/17/21 1257)    ED Course  I have reviewed the triage vital signs and the nursing notes.  Pertinent labs & imaging results that were available during my care of the patient were reviewed by me and considered in my medical decision making (see chart for details).    MDM Rules/Calculators/A&P                         Patient upon arrival was very pale in appearance.  But had no complaints.  Other than the syncopal event and the multiple episodes of coffee-ground emesis.  Stated he felt fine earlier in the day right before passing out he cannot get a numb feeling in his head.  And his vision kind and when he's had that happen a few times over the last several weeks but he has never passed out before.  And denies any chest pain any shortness of breath or any abdominal pain.  Work-up here hemoglobin is 16.2 suggesting may be a little dehydrated.  CO2 is down.  Creatinine 1.4.  Glucose 120.  Anion gap is normal though.  Mild leukocytosis with a white blood cell count of 10.9.  On rectal exam stool was light brown.  No melena or gross red blood.  Hemoccult still pending.  Coffee-ground emesis without any's gross red blood.  Patient his color is now back to normal  with some IV fluids.  Hest x-ray was negative.  His oxygen saturations on room air would be right around 90.  On 2 L patient's oxygen saturations are around 94.  Again no shortness of breath or chest pain.  So concerns for an upper GI bleed with syncope.  Will discuss with hospitalist regarding  admission.  Patient started on Protonix drip here.  Type and screen was done.  Final Clinical Impression(s) / ED Diagnoses Final diagnoses:  Syncope and collapse  Gastrointestinal hemorrhage, unspecified gastrointestinal hemorrhage type    Rx / DC Orders ED Discharge Orders    None       Fredia Sorrow, MD 03/17/21 1440    Fredia Sorrow, MD 03/17/21 1441   At the request of hospitalist.  Have consulted gastroenterology.  Call placed.  And also order urine drug screen.   Fredia Sorrow, MD 03/17/21 1504

## 2021-03-17 NOTE — ED Triage Notes (Signed)
Pt outside landscaping and passed out.Treated for heat injury last week. Pt is vomitting, diaphoretic, lightheaded and dizzy. Blood pressure 120/83. Blood sugar 103. O2 84% on RA, placed on 3L [place].

## 2021-03-18 ENCOUNTER — Observation Stay (HOSPITAL_COMMUNITY): Payer: 59 | Admitting: Anesthesiology

## 2021-03-18 ENCOUNTER — Observation Stay (HOSPITAL_COMMUNITY): Payer: 59

## 2021-03-18 ENCOUNTER — Encounter (HOSPITAL_COMMUNITY)
Admission: EM | Disposition: A | Discharge status: Home / Self Care | Payer: Self-pay | Source: Home / Self Care | Attending: Internal Medicine

## 2021-03-18 DIAGNOSIS — Z79899 Other long term (current) drug therapy: Secondary | ICD-10-CM | POA: Diagnosis not present

## 2021-03-18 DIAGNOSIS — K922 Gastrointestinal hemorrhage, unspecified: Secondary | ICD-10-CM

## 2021-03-18 DIAGNOSIS — R42 Dizziness and giddiness: Secondary | ICD-10-CM | POA: Diagnosis not present

## 2021-03-18 DIAGNOSIS — K449 Diaphragmatic hernia without obstruction or gangrene: Secondary | ICD-10-CM | POA: Diagnosis present

## 2021-03-18 DIAGNOSIS — K2270 Barrett's esophagus without dysplasia: Secondary | ICD-10-CM | POA: Diagnosis not present

## 2021-03-18 DIAGNOSIS — E876 Hypokalemia: Secondary | ICD-10-CM

## 2021-03-18 DIAGNOSIS — K3182 Dieulafoy lesion (hemorrhagic) of stomach and duodenum: Secondary | ICD-10-CM

## 2021-03-18 DIAGNOSIS — Z888 Allergy status to other drugs, medicaments and biological substances status: Secondary | ICD-10-CM | POA: Diagnosis not present

## 2021-03-18 DIAGNOSIS — Z20822 Contact with and (suspected) exposure to covid-19: Secondary | ICD-10-CM | POA: Diagnosis present

## 2021-03-18 DIAGNOSIS — N182 Chronic kidney disease, stage 2 (mild): Secondary | ICD-10-CM | POA: Diagnosis present

## 2021-03-18 DIAGNOSIS — T17908A Unspecified foreign body in respiratory tract, part unspecified causing other injury, initial encounter: Secondary | ICD-10-CM | POA: Diagnosis not present

## 2021-03-18 DIAGNOSIS — R739 Hyperglycemia, unspecified: Secondary | ICD-10-CM | POA: Diagnosis present

## 2021-03-18 DIAGNOSIS — I129 Hypertensive chronic kidney disease with stage 1 through stage 4 chronic kidney disease, or unspecified chronic kidney disease: Secondary | ICD-10-CM | POA: Diagnosis present

## 2021-03-18 DIAGNOSIS — R17 Unspecified jaundice: Secondary | ICD-10-CM | POA: Diagnosis not present

## 2021-03-18 DIAGNOSIS — K219 Gastro-esophageal reflux disease without esophagitis: Secondary | ICD-10-CM | POA: Diagnosis present

## 2021-03-18 DIAGNOSIS — Z800 Family history of malignant neoplasm of digestive organs: Secondary | ICD-10-CM | POA: Diagnosis not present

## 2021-03-18 DIAGNOSIS — R55 Syncope and collapse: Secondary | ICD-10-CM | POA: Diagnosis present

## 2021-03-18 DIAGNOSIS — R0902 Hypoxemia: Secondary | ICD-10-CM | POA: Diagnosis present

## 2021-03-18 DIAGNOSIS — N179 Acute kidney failure, unspecified: Secondary | ICD-10-CM | POA: Diagnosis present

## 2021-03-18 DIAGNOSIS — E872 Acidosis: Secondary | ICD-10-CM | POA: Diagnosis not present

## 2021-03-18 DIAGNOSIS — K2289 Other specified disease of esophagus: Secondary | ICD-10-CM | POA: Diagnosis not present

## 2021-03-18 DIAGNOSIS — K920 Hematemesis: Secondary | ICD-10-CM | POA: Diagnosis present

## 2021-03-18 DIAGNOSIS — J690 Pneumonitis due to inhalation of food and vomit: Secondary | ICD-10-CM | POA: Diagnosis not present

## 2021-03-18 DIAGNOSIS — F419 Anxiety disorder, unspecified: Secondary | ICD-10-CM | POA: Diagnosis present

## 2021-03-18 DIAGNOSIS — J302 Other seasonal allergic rhinitis: Secondary | ICD-10-CM | POA: Diagnosis present

## 2021-03-18 HISTORY — PX: ESOPHAGOGASTRODUODENOSCOPY (EGD) WITH PROPOFOL: SHX5813

## 2021-03-18 HISTORY — PX: BIOPSY: SHX5522

## 2021-03-18 LAB — COMPREHENSIVE METABOLIC PANEL
ALT: 26 U/L (ref 0–44)
AST: 20 U/L (ref 15–41)
Albumin: 3.7 g/dL (ref 3.5–5.0)
Alkaline Phosphatase: 52 U/L (ref 38–126)
Anion gap: 9 (ref 5–15)
BUN: 12 mg/dL (ref 6–20)
CO2: 25 mmol/L (ref 22–32)
Calcium: 8.5 mg/dL — ABNORMAL LOW (ref 8.9–10.3)
Chloride: 105 mmol/L (ref 98–111)
Creatinine, Ser: 1.27 mg/dL — ABNORMAL HIGH (ref 0.61–1.24)
GFR, Estimated: >60 mL/min (ref >60)
Glucose, Bld: 101 mg/dL — ABNORMAL HIGH (ref 70–99)
Potassium: 3.4 mmol/L — ABNORMAL LOW (ref 3.5–5.1)
Sodium: 139 mmol/L (ref 135–145)
Total Bilirubin: 1.7 mg/dL — ABNORMAL HIGH (ref 0.3–1.2)
Total Protein: 6.4 g/dL — ABNORMAL LOW (ref 6.5–8.1)

## 2021-03-18 LAB — CBC
HCT: 42.3 % (ref 39.0–52.0)
Hemoglobin: 14 g/dL (ref 13.0–17.0)
MCH: 30.6 pg (ref 26.0–34.0)
MCHC: 33.1 g/dL (ref 30.0–36.0)
MCV: 92.4 fL (ref 80.0–100.0)
Platelets: 217 10*3/uL (ref 150–400)
RBC: 4.58 MIL/uL (ref 4.22–5.81)
RDW: 12.8 % (ref 11.5–15.5)
WBC: 13.7 10*3/uL — ABNORMAL HIGH (ref 4.0–10.5)
nRBC: 0 % (ref 0.0–0.2)

## 2021-03-18 LAB — GLUCOSE, CAPILLARY: Glucose-Capillary: 104 mg/dL — ABNORMAL HIGH (ref 70–99)

## 2021-03-18 LAB — MAGNESIUM: Magnesium: 2.3 mg/dL (ref 1.7–2.4)

## 2021-03-18 SURGERY — ESOPHAGOGASTRODUODENOSCOPY (EGD) WITH PROPOFOL
Anesthesia: Monitor Anesthesia Care

## 2021-03-18 MED ORDER — STERILE WATER FOR IRRIGATION IR SOLN
Status: DC | PRN
  Administered 2021-03-18: 200 mL

## 2021-03-18 MED ORDER — LACTATED RINGERS IV SOLN: INTRAVENOUS | Status: DC | PRN

## 2021-03-18 MED ORDER — LIDOCAINE HCL (PF) 2 % IJ SOLN
INTRAMUSCULAR | Status: AC
  Filled 2021-03-18: qty 5

## 2021-03-18 MED ORDER — FENTANYL CITRATE (PF) 100 MCG/2ML IJ SOLN
INTRAMUSCULAR | Status: AC
  Filled 2021-03-18: qty 2

## 2021-03-18 MED ORDER — LIDOCAINE HCL (CARDIAC) PF 100 MG/5ML IV SOSY
PREFILLED_SYRINGE | INTRAVENOUS | Status: DC | PRN
  Administered 2021-03-18: 100 mg via INTRAVENOUS

## 2021-03-18 MED ORDER — PROPOFOL 500 MG/50ML IV EMUL
INTRAVENOUS | Status: DC | PRN
  Administered 2021-03-18: 200 ug/kg/min via INTRAVENOUS

## 2021-03-18 MED ORDER — FENTANYL CITRATE (PF) 100 MCG/2ML IJ SOLN
INTRAMUSCULAR | Status: DC | PRN
  Administered 2021-03-18: 65 ug via INTRAVENOUS

## 2021-03-18 MED ORDER — LIDOCAINE HCL (CARDIAC) PF 100 MG/5ML IV SOSY: PREFILLED_SYRINGE | INTRAVENOUS | Status: DC | PRN

## 2021-03-18 MED ORDER — PROPOFOL 10 MG/ML IV BOLUS
INTRAVENOUS | Status: AC
  Filled 2021-03-18: qty 40

## 2021-03-18 MED ORDER — PROPOFOL 10 MG/ML IV BOLUS
INTRAVENOUS | Status: DC | PRN
  Administered 2021-03-18: 50 mg via INTRAVENOUS
  Administered 2021-03-18: 150 mg via INTRAVENOUS

## 2021-03-18 MED ORDER — POTASSIUM CHLORIDE CRYS ER 20 MEQ PO TBCR
40.0000 meq | EXTENDED_RELEASE_TABLET | Freq: Once | ORAL | Status: AC
  Administered 2021-03-18: 40 meq via ORAL
  Filled 2021-03-18: qty 2

## 2021-03-18 NOTE — Anesthesia Postprocedure Evaluation (Signed)
Anesthesia Post Note  Patient: Elijah Shaffer  Procedure(s) Performed: ESOPHAGOGASTRODUODENOSCOPY (EGD) WITH PROPOFOL (N/A ) BIOPSY  Patient location during evaluation: PACU Anesthesia Type: General Level of consciousness: awake and alert and oriented Pain management: pain level controlled Vital Signs Assessment: post-procedure vital signs reviewed and stable Respiratory status: spontaneous breathing, respiratory function stable, nonlabored ventilation and patient connected to nasal cannula oxygen Cardiovascular status: blood pressure returned to baseline and stable Postop Assessment: no apparent nausea or vomiting Anesthetic complications: no   No complications documented.   Last Vitals:  Vitals:   03/18/21 0549 03/18/21 1316  BP:  112/78  Pulse:  79  Resp:  16  Temp:  36.9 C  SpO2: 94% 96%    Last Pain:  Vitals:   03/18/21 1316  TempSrc:   PainSc: 0-No pain                 Leshea Jaggers C Arie Gable
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
## 2021-03-18 NOTE — Progress Notes (Signed)
   03/18/21 1336  Assess: MEWS Score  BP 127/81  Pulse Rate 83  ECG Heart Rate 78  Resp (!) 21  SpO2 96 %  O2 Device Nasal Cannula  O2 Flow Rate (L/min) 2.5 L/min  Assess: MEWS Score  MEWS Temp 0  MEWS Systolic 0  MEWS Pulse 0  MEWS RR 1  MEWS LOC 1  MEWS Score 2  MEWS Score Color Yellow  Assess: if the MEWS score is Yellow or Red  Were vital signs taken at a resting state? Yes  Focused Assessment No change from prior assessment  Early Detection of Sepsis Score *See Row Information* Low  MEWS guidelines implemented *See Row Information* Yes  Treat  Pain Scale 0-10  Pain Score 0  Escalate  MEWS: Escalate Yellow: discuss with charge nurse/RN and consider discussing with provider and RRT  Notify: Charge Nurse/RN  Name of Charge Nurse/RN Notified Mardene Celeste RN  Date Charge Nurse/RN Notified 03/18/21  Time Charge Nurse/RN Notified 1600  Notify: Provider  Provider Name/Title Dr Theone Murdoch  Date Provider Notified 03/18/21  Time Provider Notified 1600  Notification Type Page (secure chat)  Date of Provider Response 03/18/21

## 2021-03-18 NOTE — Transfer of Care (Signed)
Immediate Anesthesia Transfer of Care Note  Patient: Elijah Shaffer  Procedure(s) Performed: ESOPHAGOGASTRODUODENOSCOPY (EGD) WITH PROPOFOL (N/A ) BIOPSY  Patient Location: PACU  Anesthesia Type:General  Level of Consciousness: awake, alert , oriented and sedated  Airway & Oxygen Therapy: Patient Spontanous Breathing and Patient connected to nasal cannula oxygen  Post-op Assessment: Report given to RN and Post -op Vital signs reviewed and stable  Post vital signs: Reviewed and stable  Last Vitals:  Vitals Value Taken Time  BP 112/78 03/18/21 1317  Temp 98.5   Pulse 78 03/18/21 1318  Resp 15 03/18/21 1318  SpO2 95 % 03/18/21 1318  Vitals shown include unvalidated device data.  Last Pain:  Vitals:   03/18/21 0845  TempSrc:   PainSc: 0-No pain         Complications: No complications documented.

## 2021-03-18 NOTE — Anesthesia Preprocedure Evaluation (Addendum)
Anesthesia Evaluation  Patient identified by MRN, date of birth, ID band Patient awake    Reviewed: Allergy & Precautions, NPO status , Patient's Chart, lab work & pertinent test results  Airway Mallampati: III   Neck ROM: Full    Dental  (+) Teeth Intact, Dental Advisory Given   Pulmonary sleep apnea (snoring) ,    Pulmonary exam normal breath sounds clear to auscultation       Cardiovascular Exercise Tolerance: Good hypertension, Pt. on medications Normal cardiovascular exam Rhythm:Regular Rate:Normal     Neuro/Psych Anxiety TIAnegative psych ROS   GI/Hepatic Neg liver ROS, GERD  Medicated,  Endo/Other  negative endocrine ROS  Renal/GU Renal InsufficiencyRenal disease (AKI)     Musculoskeletal negative musculoskeletal ROS (+)   Abdominal   Peds  Hematology negative hematology ROS (+)   Anesthesia Other Findings   Reproductive/Obstetrics negative OB ROS                            Anesthesia Physical Anesthesia Plan  ASA: III and emergent  Anesthesia Plan:    Post-op Pain Management:    Induction:   PONV Risk Score and Plan: Propofol infusion  Airway Management Planned: Nasal Cannula and Natural Airway  Additional Equipment:   Intra-op Plan:   Post-operative Plan:   Informed Consent: I have reviewed the patients History and Physical, chart, labs and discussed the procedure including the risks, benefits and alternatives for the proposed anesthesia with the patient or authorized representative who has indicated his/her understanding and acceptance.     Dental advisory given  Plan Discussed with: Surgeon  Anesthesia Plan Comments:         Anesthesia Quick Evaluation

## 2021-03-18 NOTE — Addendum Note (Signed)
Addendum  created 03/18/21 1334 by Denese Killings, MD   Intraprocedure Meds edited

## 2021-03-18 NOTE — Op Note (Signed)
[facility] Patient Name: Elijah Shaffer Procedure Date: 03/18/2021 12:27 PM MRN: [id number] Date of Birth: May 13, 1981 Attending MD: Maylon Peppers ,  CSN: [id number] Age: 40 Admit Type: Inpatient Procedure:                Upper GI endoscopy Indications:              Coffee-ground emesis Providers:                Maylon Peppers, Lurline Del, RN, Casimer Bilis, Technician Referring MD:              Medicines:                Monitored Anesthesia Care Complications:            No immediate complications. Estimated Blood Loss:     Estimated blood loss: none. Procedure:                Pre-Anesthesia Assessment:                           - Prior to the procedure, a History and Physical                            was performed, and patient medications, allergies                            and sensitivities were reviewed. The patient's                            tolerance of previous anesthesia was reviewed.                           - The risks and benefits of the procedure and the                            sedation options and risks were discussed with the                            patient. All questions were answered and informed                            consent was obtained.                           - ASA Grade Assessment: II - A patient with mild                            systemic disease.                           After obtaining informed consent, the endoscope was                            passed under direct vision. Throughout the  procedure, the patient's blood pressure, pulse, and                            oxygen saturations were monitored continuously. The                            GIF-H190 ([id number]) scope was introduced through the                            mouth, and advanced to the second part of duodenum.                            The upper GI endoscopy was accomplished without                             difficulty. The patient tolerated the procedure                            well. Scope In: 12:46:22 PM Scope Out: 12:59:04 PM Total Procedure Duration: 0 hours 12 minutes 42 seconds  Findings:      The esophagus and gastroesophageal junction were examined with white       light and narrow band imaging (NBI). There were esophageal mucosal       changes suggestive of short-segment Barrett's esophagus, classified as       Barrett's stage C3-M3 per Prague criteria. These changes involved the       mucosa at the upper extent of the gastric folds (37 cm from the       incisors) extending to the Z-line (34 cm from the incisors).       Circumferential salmon-colored mucosa was present from 34 to 37 cm. The       maximum longitudinal extent of these esophageal mucosal changes was 3 cm       in length. The z-line was biopsied with a cold forceps for histology and       to confirm diagnosis.      A single 10 mm plaque was found just above the gastroesophageal       junction, 36 to 37 cm from the incisors. The plaque had violaceous       discoloration, which raised the concern for possible Mallory Weiss tear,       but malignancy will need to be considered. No biopsies were taken given       the acuity of his presentation.      A medium-sized hiatal hernia was present.      A focalized are with active slow oozing bleeding was found in the       gastric body, possible Dieulafoy lesion. Coagulation for hemostasis       using bipolar probe was successful.      The examined duodenum was normal. Impression:               - Esophageal mucosal changes suggestive of                            short-segment Barrett's esophagus, classified as  Barrett's stage C3-M3 per Prague criteria. Biopsied.                           - A single plaque above the gastroesophageal                            junction - Mallory Weiss tear vs malignancy.                           - Medium-sized  hiatal hernia.                           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                           - Normal examined duodenum. Moderate Sedation:      Per Anesthesia Care Recommendation:           - Return patient to hospital ward for ongoing care.                           - Soft diet, advance as tolerated.                           - Await pathology results.                           - Repeat upper endoscopy in 2 months for                            surveillance.                           - Use Prilosec (omeprazole) 40 mg PO BID for 3                            months.                           - No ibuprofen, naproxen, or other non-steroidal                            anti-inflammatory drugs. Procedure Code(s):        --- Professional ---                           42595, 74, Esophagogastroduodenoscopy, flexible,                            transoral; with control of bleeding, any method                           43239, Esophagogastroduodenoscopy, flexible,                            transoral; with biopsy, single or multiple Diagnosis Code(s):        --- Professional ---  K22.70, Barrett's esophagus without dysplasia                           K22.8, Other specified diseases of esophagus                           K44.9, Diaphragmatic hernia without obstruction or                            gangrene                           K31.82, Dieulafoy lesion (hemorrhagic) of stomach                            and duodenum                           K92.0, Hematemesis CPT copyright 2019 American Medical Association. All rights reserved. The codes documented in this report are preliminary and upon coder review may  be revised to meet current compliance requirements. Maylon Peppers, MD Maylon Peppers,  03/18/2021 1:12:48 PM This report has been signed electronically. Number of Addenda: 0

## 2021-03-18 NOTE — Brief Op Note (Signed)
03/17/2021 - 03/18/2021  1:13 PM  PATIENT:  Elijah Shaffer  41 y.o. male  PRE-OPERATIVE DIAGNOSIS:  coffee ground emesis  POST-OPERATIVE DIAGNOSIS:  possible,mallory-weiss tear; Barrett's esophagus; hiatal, hernia; possible,dieulafoy lesion; Hiatus @37 ; Z-line @ 34;  PROCEDURE:  Procedure(s) with comments: ESOPHAGOGASTRODUODENOSCOPY (EGD) WITH PROPOFOL (N/A) BIOPSY - esophageal at Z-line  SURGEON:  Surgeon(s) and Role:    * Harvel Quale, MD - Primary  Patient underwent esophagogastroduodenospy under propofol sedation.  Tolerated procedure adequately.  He was found to have a 3 cm circumferential segment suggestive of Barrett's esophagus.  This was carefully inspected with NBI.  Biopsies were taken at the GE junction.  There was presence of plaque above the GE junction which had some possible fibrin and violaceous discoloration.  This was suggestive of possible Mallory-Weiss tear versus questionable malignancy, no biopsies were taken as the patient had recent episode of coffee-ground emesis.  There was a hiatal hernia.  Stomach had presence of 1 single area that had ongoing oozing at the gastric body, which was suggestive of possible Dieulafoy lesion.  This was ablated with bipolar probe successfully.  The rest of the stomach was normal.  Duodenum was within normal limits.  RECOMMENDATIONS: - Return patient to hospital ward for ongoing care.  - Soft diet, advance as tolerated.  - Await pathology results.  - Repeat upper endoscopy in 2 months for surveillance.  - Use Prilosec (omeprazole) 40 mg PO BID for 3 months.  - No ibuprofen, naproxen, or other non-steroidal anti-inflammatory drugs.  - Patient will follow up in GI clinic with Dr. Phillips Climes in 2-3 weeks. - GI service will sign-off, please call us back if you have any more questions.  Maylon Peppers, MD Gastroenterology and Hepatology [facility]

## 2021-03-18 NOTE — Consult Note (Signed)
Maylon Peppers, M.D. Gastroenterology & Hepatology                                           Patient Name: Elijah Shaffer Account #: $[email or web address] [id number]   MRN: [id number] Admission Date: 03/17/2021 Date of Evaluation:  03/18/2021 Time of Evaluation: 10:24 AM  Referring Physician: Baird Kay, DO  Chief Complaint: Coffee-ground emesis and syncope  HPI:  This is a 40 y.o. male with history of GERD, history of diverticulitis and anxiety, who comes to the hospital after presenting an episode of syncope and coffee-ground emesis.  Most of the history is obtained from medical chart of the patient presented an episode of syncope.  The patient reports that yesterday he was working and he reported having new onset lightheadedness.  He reports that he eventually had a syncopal episode.  Has had these episodes of lightheadedness multiple times in the last month but never had a syncope in the past.  He was told that he had two episodes of coffee-ground emesis on the way to the hospital.  He was also presenting significant diaphoresis at that time.  He also presented with multiple episodes of coffee-ground emesis in the ED at [facility] and was admitted to the hospital due to this.  In the ED, the patient was afebrile and was hemodynamically stable but had an initial heart rate of 102.  He was found to have although cell count of 10.9, with hemoglobin of 16.2 and platelets of 308, CMP showed normal BUN of 13 and creatinine was elevated 1.4, normal electrolytes and liver function tests.  His repeat hemoglobin today was 14.0 with mild elevation of his total bilirubin of 1.7 but improvement of his creatinine of 1.27.  U tox was negative.  Head CT of the head was within normal limits.  Notably, the patient has been taking ibuprofen 3 times per month for headaches chronically.  Last EGD: never Last Colonoscopy: 09/2017-- Two 4 to 6 mm polyps in the transverse colon and in the ascending colon, removed with a cold  snare. Resected and retrieved. - Two 7 to 8 mm polyps in the transverse colon. Clips (MR conditional) were placed. - Diverticulosis in the entire examined colon. - Internal hemorrhoids.  1. Colon, polyp(s), transverse - TUBULAR ADENOMA (5 OF 5 FRAGMENTS) - NO HIGH GRADE DYSPLASIA OR MALIGNANCY IDENTIFIED 2. Colon, polyp(s), ascending - TUBULAR ADENOMA (4 OF 4 FRAGMENTS) - NO HIGH GRADE DYSPLASIA OR MALIGNANCY IDENTIFIED  FHx: neg for any gastrointestinal/liver disease,mathernaln grandmother colon cancer Social: neg smoking, alcohol or illicit drug use  Next colonoscopy in 3 years.  Past Medical History: SEE CHRONIC ISSSUES: Past Medical History:  Diagnosis Date  . Acute renal injury ([place]) 06/28/2017  . Anxiety   . Diverticulitis   . GERD (gastroesophageal reflux disease) 01/08/2018  . Seasonal allergies    Past Surgical History:  Past Surgical History:  Procedure Laterality Date  . COLONOSCOPY N/A 09/22/2017   Procedure: COLONOSCOPY;  Surgeon: Rogene Houston, MD;  [facility];  Service: Endoscopy;  Laterality: N/A;  9:55  . POLYPECTOMY  09/22/2017   Procedure: POLYPECTOMY;  Surgeon: Rogene Houston, MD;  Location[facility];  Service: Endoscopy;;   Family History:  Family History  Problem Relation Age of Onset  . Colon cancer Maternal Grandmother   . Colon cancer Maternal Grandfather   . Healthy  Mother   . Healthy Father    Social History:  Social History   Tobacco Use  . Smoking status: Never Smoker  . Smokeless tobacco: Never Used  Vaping Use  . Vaping Use: Never used  Substance Use Topics  . Alcohol use: Yes    Comment: occasional  . Drug use: No    Home Medications:  Prior to Admission medications   Medication Sig Start Date End Date Taking? Authorizing Provider  acetaminophen (TYLENOL) 500 MG tablet Take 500 mg by mouth every 6 (six) hours as needed.   Yes [provider]  cetirizine (ZYRTEC) 10 MG tablet Take 10 mg by mouth  daily.   Yes [provider]  fluticasone (FLONASE ALLERGY RELIEF) 50 MCG/ACT nasal spray Place 2 sprays into both nostrils daily. 03/05/21  Yes Scot Jun, FNP  triamcinolone ointment (KENALOG) 0.5 % Apply 1 application topically daily as needed (itching). 12/06/19  Yes [provider]  hydrOXYzine (ATARAX/VISTARIL) 10 MG tablet Take 1 tablet (10 mg total) by mouth 3 (three) times daily as needed for anxiety. 02/25/21   Cristal Deer, MD  promethazine-dextromethorphan (PROMETHAZINE-DM) 6.25-15 MG/5ML syrup Take 5 mLs by mouth 3 (three) times daily as needed for cough. 03/05/21   Scot Jun, FNP    Inpatient Medications:  Current Facility-Administered Medications:  .  0.9 %  sodium chloride infusion, , Intravenous, Continuous, Raiford Noble [place], [place], Last Rate: 125 mL/hr at 03/18/21 0606, Infusion Verify at 03/18/21 0606 .  acetaminophen (TYLENOL) tablet 500 mg, 500 mg, Oral, Q6H PRN, Raiford Noble Latif, DO, 500 mg at 03/18/21 0202 .  bisacodyl (DULCOLAX) suppository 10 mg, 10 mg, Rectal, Daily PRN, Sheikh, Omair Latif, DO .  fluticasone [facility]) 50 MCG/ACT nasal spray 2 spray, 2 spray, Each Nare, Daily, Sheikh, Georgina Quint [place], [place], 2 spray at 03/18/21 [phone] .  hydrOXYzine (ATARAX/VISTARIL) tablet 10 mg, 10 mg, Oral, TID PRN, Alfredia Ferguson, Omair Latif, DO .  ondansetron [facility]) tablet 4 mg, 4 mg, Oral, Q6H PRN **OR** ondansetron (ZOFRAN) injection 4 mg, 4 mg, Intravenous, Q6H PRN, Sheikh, Omair Latif, DO .  oxyCODONE (Oxy IR/ROXICODONE) immediate release tablet 5 mg, 5 mg, Oral, Q4H PRN, Sheikh, Omair Latif, DO .  pantoprazole (PROTONIX) 80 mg in sodium chloride 0.9 % 100 mL (0.8 mg/mL) infusion, 8 mg/hr, Intravenous, Continuous, Sheikh, Omair Latif, DO, Last Rate: 10 mL/hr at 03/18/21 0606, 8 mg/hr at 03/18/21 0606 .  [START ON 03/21/2021] pantoprazole (PROTONIX) injection 40 mg, 40 mg, Intravenous, Q12H, Sheikh, Omair Latif, DO .  polyethylene glycol (MIRALAX / GLYCOLAX)  packet 17 g, 17 g, Oral, Daily PRN, Sheikh, Omair Latif, DO .  sodium chloride flush (NS) 0.9 % injection 3 mL, 3 mL, Intravenous, Q12H, Sheikh, Omair Latif, DO Allergies: Other and Claritin [loratadine]  Complete Review of Systems: GENERAL: negative for malaise, night sweats HEENT: No changes in hearing or vision, no nose bleeds or other nasal problems. NECK: Negative for lumps, goiter, pain and significant neck swelling RESPIRATORY: Negative for cough, wheezing CARDIOVASCULAR: Negative for chest pain, leg swelling, palpitations, orthopnea GI: SEE HPI MUSCULOSKELETAL: Negative for joint pain or swelling, back pain, and muscle pain. SKIN: Negative for lesions, rash PSYCH: Negative for sleep disturbance, mood disorder and recent psychosocial stressors. HEMATOLOGY Negative for prolonged bleeding, bruising easily, and swollen nodes. ENDOCRINE: Negative for cold or heat intolerance, polyuria, polydipsia and goiter. NEURO: negative for tremor, gait imbalance, syncope and seizures. The remainder of the review of systems is noncontributory.  Physical Exam: BP 117/71  Pulse 67   Temp 99.4 F (37.4 C) (Oral)   Resp 18   Ht $R'6\' 3"'mB$  (1.905 m)   Wt 106.4 kg   SpO2 94% Comment: portable pulse ox check  BMI 29.32 kg/m  GENERAL: The patient is AO x3, in no acute distress. HEENT: Head is normocephalic and atraumatic. EOMI are intact. Mouth is well hydrated and without lesions. NECK: Supple. No masses LUNGS: Clear to auscultation. No presence of rhonchi/wheezing/rales. Adequate chest expansion HEART: RRR, normal s1 and s2. ABDOMEN: Soft, nontender, no guarding, no peritoneal signs, and nondistended. BS +. No masses. EXTREMITIES: Without any cyanosis, clubbing, rash, lesions or edema. NEUROLOGIC: AOx3, no focal motor deficit. SKIN: no jaundice, no rashes  Laboratory Data CBC:     Component Value Date/Time   WBC 13.7 (H) 03/18/2021 0357   RBC 4.58 03/18/2021 0357   HGB 14.0 03/18/2021 0357    HCT 42.3 03/18/2021 0357   PLT 217 03/18/2021 0357   MCV 92.4 03/18/2021 0357   MCH 30.6 03/18/2021 0357   MCHC 33.1 03/18/2021 0357   RDW 12.8 03/18/2021 0357   LYMPHSABS 3.9 03/17/2021 1222   MONOABS 0.8 03/17/2021 1222   EOSABS 0.1 03/17/2021 1222   BASOSABS 0.1 03/17/2021 1222   COAG:  Lab Results  Component Value Date   INR 1.0 02/23/2021    BMP:  BMP Latest Ref Rng & Units 03/18/2021 03/17/2021 02/25/2021  Glucose 70 - 99 mg/dL 101(H) 120(H) -  BUN 6 - 20 mg/dL 12 13 -  Creatinine 0.61 - 1.24 mg/dL 1.27(H) 1.40(H) 1.44(H)  BUN/Creat Ratio 6 - 22 (calc) - - -  Sodium 135 - 145 mmol/L 139 138 -  Potassium 3.5 - 5.1 mmol/L 3.4(L) 3.9 -  Chloride 98 - 111 mmol/L 105 108 -  CO2 22 - 32 mmol/L 25 19(L) -  Calcium 8.9 - 10.3 mg/dL 8.5(L) 9.4 -    HEPATIC:  Hepatic Function Latest Ref Rng & Units 03/18/2021 03/17/2021 02/23/2021  Total Protein 6.5 - 8.1 g/dL 6.4(L) 7.3 7.9  Albumin 3.5 - 5.0 g/dL 3.7 4.5 4.9  AST 15 - 41 U/L $Remo'20 25 19  'XPzAz$ ALT 0 - 44 U/L 26 34 20  Alk Phosphatase 38 - 126 U/L 52 60 60  Total Bilirubin 0.3 - 1.2 mg/dL 1.7(H) 0.8 1.2    CARDIAC:  Lab Results  Component Value Date   CKTOTAL 162 03/17/2021   CKMB 1.1 04/18/2010     Imaging: I personally reviewed and interpreted the available imaging.  Assessment & Plan: This is a 40 y.o. male with history of GERD, history of diverticulitis and anxiety, who comes to the hospital after presenting an episode of syncope and coffee-ground emesis.  The patient has presented episodes of Coffee-ground emesis recently which are concerning for an upper gastrointestinal bleeding.  He is currently on PPI twice daily which should be continued.  He received a dose of Reglan yesterday to improve his gastric emptying.  It is possible that his current presentation is related to NSAID induced peptic ulcer disease.  We will need to investigate this further with an EGD today.  Patient understood and agreed.  # Upper gastrointestinal  bleeding - Repeat CBC qday, transfuse if Hb <7 - Pantoprazole 40 mg q12h IVP - 2 large bore IV lines - Active T/S - Keep NPO - Avoid NSAIDs - Will proceed with EGD today  Harvel Quale, MD Gastroenterology and Hepatology [facility]

## 2021-03-19 DIAGNOSIS — R42 Dizziness and giddiness: Secondary | ICD-10-CM

## 2021-03-19 DIAGNOSIS — N179 Acute kidney failure, unspecified: Secondary | ICD-10-CM

## 2021-03-19 DIAGNOSIS — J690 Pneumonitis due to inhalation of food and vomit: Secondary | ICD-10-CM

## 2021-03-19 DIAGNOSIS — R519 Headache, unspecified: Secondary | ICD-10-CM

## 2021-03-19 LAB — COMPREHENSIVE METABOLIC PANEL
ALT: 28 U/L (ref 0–44)
AST: 23 U/L (ref 15–41)
Albumin: 3.8 g/dL (ref 3.5–5.0)
Alkaline Phosphatase: 53 U/L (ref 38–126)
Anion gap: 7 (ref 5–15)
BUN: 11 mg/dL (ref 6–20)
CO2: 27 mmol/L (ref 22–32)
Calcium: 9 mg/dL (ref 8.9–10.3)
Chloride: 103 mmol/L (ref 98–111)
Creatinine, Ser: 1.16 mg/dL (ref 0.61–1.24)
GFR, Estimated: >60 mL/min (ref >60)
Glucose, Bld: 98 mg/dL (ref 70–99)
Potassium: 3.7 mmol/L (ref 3.5–5.1)
Sodium: 137 mmol/L (ref 135–145)
Total Bilirubin: 1.4 mg/dL — ABNORMAL HIGH (ref 0.3–1.2)
Total Protein: 6.8 g/dL (ref 6.5–8.1)

## 2021-03-19 LAB — CBC WITH DIFFERENTIAL/PLATELET
Abs Immature Granulocytes: 0.08 10*3/uL — ABNORMAL HIGH (ref 0.00–0.07)
Basophils Absolute: 0.1 10*3/uL (ref 0.0–0.1)
Basophils Relative: 1 %
Eosinophils Absolute: 0.2 10*3/uL (ref 0.0–0.5)
Eosinophils Relative: 1 %
HCT: 44.3 % (ref 39.0–52.0)
Hemoglobin: 14.4 g/dL (ref 13.0–17.0)
Immature Granulocytes: 1 %
Lymphocytes Relative: 20 %
Lymphs Abs: 2.5 10*3/uL (ref 0.7–4.0)
MCH: 29.9 pg (ref 26.0–34.0)
MCHC: 32.5 g/dL (ref 30.0–36.0)
MCV: 92.1 fL (ref 80.0–100.0)
Monocytes Absolute: 1 10*3/uL (ref 0.1–1.0)
Monocytes Relative: 8 %
Neutro Abs: 8.5 10*3/uL — ABNORMAL HIGH (ref 1.7–7.7)
Neutrophils Relative %: 69 %
Platelets: 215 10*3/uL (ref 150–400)
RBC: 4.81 MIL/uL (ref 4.22–5.81)
RDW: 12.5 % (ref 11.5–15.5)
WBC: 12.2 10*3/uL — ABNORMAL HIGH (ref 4.0–10.5)
nRBC: 0 % (ref 0.0–0.2)

## 2021-03-19 LAB — PHOSPHORUS: Phosphorus: 2.8 mg/dL (ref 2.5–4.6)

## 2021-03-19 LAB — MAGNESIUM: Magnesium: 1.9 mg/dL (ref 1.7–2.4)

## 2021-03-19 LAB — GLUCOSE, CAPILLARY: Glucose-Capillary: 100 mg/dL — ABNORMAL HIGH (ref 70–99)

## 2021-03-19 MED ORDER — IPRATROPIUM BROMIDE 0.02 % IN SOLN: 0.5000 mg | Freq: Four times a day (QID) | RESPIRATORY_TRACT | Status: DC | PRN

## 2021-03-19 MED ORDER — PANTOPRAZOLE SODIUM 40 MG PO TBEC: 40.0000 mg | DELAYED_RELEASE_TABLET | Freq: Two times a day (BID) | ORAL | 0 refills | Status: DC

## 2021-03-19 MED ORDER — LEVALBUTEROL HCL 0.63 MG/3ML IN NEBU: 0.6300 mg | INHALATION_SOLUTION | Freq: Four times a day (QID) | RESPIRATORY_TRACT | Status: DC | PRN

## 2021-03-19 MED ORDER — ACETAMINOPHEN 325 MG PO TABS
650.0000 mg | ORAL_TABLET | Freq: Four times a day (QID) | ORAL | Status: DC | PRN
  Administered 2021-03-19: 650 mg via ORAL
  Filled 2021-03-19: qty 2

## 2021-03-19 MED ORDER — ONDANSETRON HCL 4 MG PO TABS: 4.0000 mg | ORAL_TABLET | Freq: Four times a day (QID) | ORAL | 0 refills | Status: DC | PRN

## 2021-03-19 MED ORDER — AMOXICILLIN-POT CLAVULANATE 875-125 MG PO TABS
1.0000 | ORAL_TABLET | Freq: Two times a day (BID) | ORAL | Status: DC
  Administered 2021-03-19 – 2021-03-20 (×3): 1 via ORAL
  Filled 2021-03-19 (×3): qty 1

## 2021-03-19 MED ORDER — GUAIFENESIN ER 600 MG PO TB12
600.0000 mg | ORAL_TABLET | Freq: Two times a day (BID) | ORAL | Status: DC
  Administered 2021-03-19 – 2021-03-20 (×3): 600 mg via ORAL
  Filled 2021-03-19 (×3): qty 1

## 2021-03-19 MED ORDER — SODIUM CHLORIDE 0.9 % IV BOLUS
500.0000 mL | Freq: Once | INTRAVENOUS | Status: AC
  Administered 2021-03-19: 500 mL via INTRAVENOUS

## 2021-03-19 MED ORDER — PANTOPRAZOLE SODIUM 40 MG PO TBEC
40.0000 mg | DELAYED_RELEASE_TABLET | Freq: Two times a day (BID) | ORAL | Status: DC
  Administered 2021-03-19 – 2021-03-20 (×2): 40 mg via ORAL
  Filled 2021-03-19 (×2): qty 1

## 2021-03-19 MED ORDER — AMOXICILLIN-POT CLAVULANATE 875-125 MG PO TABS: 1.0000 | ORAL_TABLET | Freq: Two times a day (BID) | ORAL | 0 refills | Status: AC

## 2021-03-19 MED ORDER — BUTALBITAL-APAP-CAFFEINE 50-325-40 MG PO TABS
1.0000 | ORAL_TABLET | Freq: Four times a day (QID) | ORAL | Status: DC | PRN
  Administered 2021-03-19: 1 via ORAL
  Filled 2021-03-19: qty 1

## 2021-03-19 MED ORDER — POLYETHYLENE GLYCOL 3350 17 G PO PACK: 17.0000 g | PACK | Freq: Every day | ORAL | 0 refills | Status: DC | PRN

## 2021-03-19 NOTE — Evaluation (Signed)
Clinical/Bedside Swallow Evaluation Patient Details  Name: Elijah Shaffer MRN: [id number] Date of Birth: 1981/03/21  Today's Date: 03/19/2021 Time: SLP Start Time (ACUTE ONLY): 1350 SLP Stop Time (ACUTE ONLY): 1404 SLP Time Calculation (min) (ACUTE ONLY): 14 min  Past Medical History:  Past Medical History:  Diagnosis Date  . Acute renal injury ([place]) 06/28/2017  . Anxiety   . Diverticulitis   . GERD (gastroesophageal reflux disease) 01/08/2018  . Seasonal allergies    Past Surgical History:  Past Surgical History:  Procedure Laterality Date  . COLONOSCOPY N/A 09/22/2017   Procedure: COLONOSCOPY;  Surgeon: Rogene Houston, MD;  [facility];  Service: Endoscopy;  Laterality: N/A;  9:55  . POLYPECTOMY  09/22/2017   Procedure: POLYPECTOMY;  Surgeon: Rogene Houston, MD;  [facility];  Service: Endoscopy;;   HPI:  This is a 40 y.o. male with history of GERD, history of diverticulitis and anxiety, who comes to the hospital after presenting an episode of syncope and coffee-ground emesis. Pt with recent focal neurological deficit with possible TIA versus Bell's palsy with recent initiation Plavix and aspirin at that time but it does not appear that he was discharged with it.  Empirically he started on acyclovir for possible Bell's palsy as well and no longer taking. Subsequently after discharge from recent hospitalization patient did try to developing a lot of abdominal pain and burping on 03/05/2021 and then woke up in the middle the night and felt as if he stopped breathing that day and came to the urgent care for further evaluation. EGD completed yesterday.  "He was found to have a 3 cm circumferential segment suggestive of Barrett's esophagus.  This was carefully inspected with NBI.  Biopsies were taken at the GE junction.  There was presence of plaque above the GE junction which had some possible fibrin and violaceous discoloration.  This was suggestive of possible  Mallory-Weiss tear versus questionable malignancy, no biopsies were taken as the patient had recent episode of coffee-ground emesis.  There was a hiatal hernia.  Stomach had presence of 1 single area that had ongoing oozing at the gastric body, which was suggestive of possible Dieulafoy lesion.  This was ablated with bipolar probe successfully.  The rest of the stomach was normal.  Duodenum was within normal limits." BSE requested.   Assessment / Plan / Recommendation Clinical Impression  Clinical swallow evaluation completed at bedside. Oral motor examination is WNL (Pt reportedly had possible Bells Palsy two weeks ago with complete resolution). Pt self presented regular textures and thin liquids via cup/straw and exhibited no signs or symptoms of aspiration and no reports of globus. Ok to advance to regular textures (per GI, just had EGD yesterday) and no further SLP services indicated at this time. SLP Visit Diagnosis: Dysphagia, unspecified (R13.10)    Aspiration Risk  No limitations    Diet Recommendation Regular;Thin liquid   Liquid Administration via: Cup;Straw Medication Administration: Whole meds with liquid Supervision: Patient able to self feed    Other  Recommendations Oral Care Recommendations: Oral care BID Other Recommendations: Clarify dietary restrictions   Follow up Recommendations None      Frequency and Duration            Prognosis Prognosis for Safe Diet Advancement: Good      Swallow Study   General Date of Onset: 03/17/21 HPI: This is a 40 y.o. male with history of GERD, history of diverticulitis and anxiety, who comes to the hospital  after presenting an episode of syncope and coffee-ground emesis. Pt with recent focal neurological deficit with possible TIA versus Bell's palsy with recent initiation Plavix and aspirin at that time but it does not appear that he was discharged with it.  Empirically he started on acyclovir for possible Bell's palsy as well and  no longer taking. Subsequently after discharge from recent hospitalization patient did try to developing a lot of abdominal pain and burping on 03/05/2021 and then woke up in the middle the night and felt as if he stopped breathing that day and came to the urgent care for further evaluation. EGD completed yesterday.  "He was found to have a 3 cm circumferential segment suggestive of Barrett's esophagus.  This was carefully inspected with NBI.  Biopsies were taken at the GE junction.  There was presence of plaque above the GE junction which had some possible fibrin and violaceous discoloration.  This was suggestive of possible Mallory-Weiss tear versus questionable malignancy, no biopsies were taken as the patient had recent episode of coffee-ground emesis.  There was a hiatal hernia.  Stomach had presence of 1 single area that had ongoing oozing at the gastric body, which was suggestive of possible Dieulafoy lesion.  This was ablated with bipolar probe successfully.  The rest of the stomach was normal.  Duodenum was within normal limits." BSE requested. Type of Study: Bedside Swallow Evaluation Previous Swallow Assessment: N/A Diet Prior to this Study: Thin liquids;Dysphagia 3 (soft) Temperature Spikes Noted: No Respiratory Status: Room air History of Recent Intubation: No Behavior/Cognition: Alert;Cooperative;Pleasant mood Oral Cavity Assessment: Within Functional Limits Oral Care Completed by SLP: No Oral Cavity - Dentition: Adequate natural dentition (missing some dentition) Vision: Functional for self-feeding Self-Feeding Abilities: Able to feed self Patient Positioning: Upright in bed Baseline Vocal Quality: Normal Volitional Cough: Strong Volitional Swallow: Able to elicit    Oral/Motor/Sensory Function Overall Oral Motor/Sensory Function: Within functional limits   Ice Chips Ice chips: Within functional limits Presentation: Spoon   Thin Liquid Thin Liquid: Within functional  limits Presentation: Cup;Straw;Self Fed    Nectar Thick Nectar Thick Liquid: Not tested   Honey Thick Honey Thick Liquid: Not tested   Puree Puree: Within functional limits   Solid     Solid: Within functional limits Presentation: Self Fed     Thank you,  Genene Churn, [place] 03/19/2021,2:07 PM
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
## 2021-03-19 NOTE — Evaluation (Signed)
Physical Therapy Evaluation Patient Details Name: Elijah Shaffer MRN: [id number] DOB: [birth year] Today's Date: 03/19/2021   History of Present Illness  Elijah Shaffer is a 40 y.o. male with medical history significant for but not limited too history of anxiety, GERD, diverticulitis, seasonal allergies, acute renal injury, essential hypertension, recent focal neurological deficit with possible TIA versus Bell's palsy with recent initiation Plavix and aspirin at that time but it does not appear that he was discharged with it.  Empirically he started on acyclovir for possible Bell's palsy as well and no longer taking. Subsequently after discharge from recent hospitalization patient did try to developing a lot of abdominal pain and burping on 03/05/2021 and then woke up in the middle the night and felt as if he stopped breathing that day and came to the urgent care for further evaluation.  He had a sensation of loss of breath and chest palpitations that woke him up in the middle night.  He took his anxiety medications which comes on the symptoms now.  At that time he noticed a cough that causes chest discomfort and he experiences nasal symptoms of congestion and postnasal drainage.  He was given fluticasone and Promethazine DM for his cough and discharged from the urgent care.    Clinical Impression  Patient functioning at baseline for functional mobility and gait demonstrating good return for ambulation in room and hallway without loss of balance while on room air with SpO2 at 96% - RN notified.  Plan:  Patient discharged from physical therapy to care of nursing for ambulation daily as tolerated for length of stay.     Follow Up Recommendations No PT follow up    Equipment Recommendations  None recommended by PT    Recommendations for Other Services       Precautions / Restrictions Precautions Precautions: None Restrictions Weight Bearing Restrictions: No      Mobility  Bed Mobility Overal  bed mobility: Independent                  Transfers Overall transfer level: Independent               General transfer comment: Sit to stand and ambulation returning to sit to stand with independence.  Ambulation/Gait Ambulation/Gait assistance: Modified independent (Device/Increase time) Gait Distance (Feet): 200 Feet Assistive device: None Gait Pattern/deviations: WFL(Within Functional Limits) Gait velocity: slightly decreased   General Gait Details: good return for ambulation in room and hallways without loss of balance  Stairs            Wheelchair Mobility    Modified Rankin (Stroke Patients Only)       Balance Overall balance assessment: No apparent balance deficits (not formally assessed)                                           Pertinent Vitals/Pain Pain Assessment: No/denies pain    Home Living Family/patient expects to be discharged to:: Private residence Living Arrangements: Parent Available Help at Discharge: Family Type of Home: House Home Access: Stairs to enter Entrance Stairs-Rails: None Entrance Stairs-Number of Steps: 2 Home Layout: One level Home Equipment: Environmental consultant - 2 wheels      Prior Function Level of Independence: Independent         Comments: Hydrographic surveyor, drives, works as a Licensed conveyancer  Hand: Right    Extremity/Trunk Assessment   Upper Extremity Assessment Upper Extremity Assessment: Defer to OT evaluation    Lower Extremity Assessment Lower Extremity Assessment: Overall WFL for tasks assessed    Cervical / Trunk Assessment Cervical / Trunk Assessment: Normal  Communication   Communication: No difficulties  Cognition Arousal/Alertness: Awake/alert Behavior During Therapy: WFL for tasks assessed/performed Overall Cognitive Status: Within Functional Limits for tasks assessed                                        General  Comments      Exercises     Assessment/Plan    PT Assessment Patent does not need any further PT services  PT Problem List         PT Treatment Interventions      PT Goals (Current goals can be found in the Care Plan section)  Acute Rehab PT Goals Patient Stated Goal: return home PT Goal Formulation: With patient/family Time For Goal Achievement: 03/19/21 Potential to Achieve Goals: Good    Frequency     Barriers to discharge        Co-evaluation PT/OT/SLP Co-Evaluation/Treatment: Yes Reason for Co-Treatment: To address functional/ADL transfers PT goals addressed during session: Mobility/safety with mobility;Balance OT goals addressed during session: ADL's and self-care       AM-PAC PT "6 Clicks" Mobility  Outcome Measure Help needed turning from your back to your side while in a flat bed without using bedrails?: None Help needed moving from lying on your back to sitting on the side of a flat bed without using bedrails?: None Help needed moving to and from a bed to a chair (including a wheelchair)?: None Help needed standing up from a chair using your arms (e.g., wheelchair or bedside chair)?: None Help needed to walk in hospital room?: None Help needed climbing 3-5 steps with a railing? : None 6 Click Score: 24    End of Session   Activity Tolerance: Patient tolerated treatment well Patient left: in bed;with call bell/phone within reach;with family/visitor present Nurse Communication: Mobility status PT Visit Diagnosis: Unsteadiness on feet (R26.81);Other abnormalities of gait and mobility (R26.89);Muscle weakness (generalized) (M62.81)    Time: 5638-9373 PT Time Calculation (min) (ACUTE ONLY): 20 min   Charges:   PT Evaluation $PT Eval Moderate Complexity: 1 Mod PT Treatments $Therapeutic Activity: 8-22 mins        12:11 PM, 03/19/21 Lonell Grandchild, MPT Physical Therapist with [facility] [phone] office [phone] mobile  phone

## 2021-03-19 NOTE — Evaluation (Signed)
Occupational Therapy Evaluation Patient Details Name: Elijah Shaffer MRN: [id number] DOB: 10-09-1981 Today's Date: 03/19/2021    History of Present Illness Elijah Shaffer is a 40 y.o. male with medical history significant for but not limited too history of anxiety, GERD, diverticulitis, seasonal allergies, acute renal injury, essential hypertension, recent focal neurological deficit with possible TIA versus Bell's palsy with recent initiation Plavix and aspirin at that time but it does not appear that he was discharged with it.  Empirically he started on acyclovir for possible Bell's palsy as well and no longer taking. Subsequently after discharge from recent hospitalization patient did try to developing a lot of abdominal pain and burping on 03/05/2021 and then woke up in the middle the night and felt as if he stopped breathing that day and came to the urgent care for further evaluation.  He had a sensation of loss of breath and chest palpitations that woke him up in the middle night.  He took his anxiety medications which comes on the symptoms now.  At that time he noticed a cough that causes chest discomfort and he experiences nasal symptoms of congestion and postnasal drainage.  He was given fluticasone and Promethazine DM for his cough and discharged from the urgent care.   Clinical Impression   Pt pleasant and agreeable to OT/PT co-evaluation. Pt able to don socks seated at EOB with independence. Pt able to complete functional ambulation and sit to stand from EOB with independence. Pt demonstrates B UE shoulder MMT WFL tested seated at EOB. Pt O2 saturation stable at 97% when seated at EOB and during functional ambulation. Pt not recommended for further OT in the hospital setting due to being at or near baseline levels. Pt will be discharged to the care of nursing staff for the remainder of his hospital stay.     Follow Up Recommendations  No OT follow up    Equipment Recommendations  None  recommended by OT           Precautions / Restrictions Precautions Precautions: None Restrictions Weight Bearing Restrictions: No      Mobility Bed Mobility Overal bed mobility: Independent                  Transfers Overall transfer level: Independent               General transfer comment: Sit to stand and ambulation returning to sit to stand with independence.    Balance Overall balance assessment: Independent                                         ADL either performed or assessed with clinical judgement   ADL Overall ADL's : Independent                                             Vision Baseline Vision/History: Wears glasses Wears Glasses: Distance only Patient Visual Report: No change from baseline                  Pertinent Vitals/Pain Pain Assessment: No/denies pain     Hand Dominance Right   Extremity/Trunk Assessment Upper Extremity Assessment Upper Extremity Assessment: Overall WFL for tasks assessed   Lower Extremity Assessment Lower Extremity Assessment: Defer to PT evaluation  Cervical / Trunk Assessment Cervical / Trunk Assessment: Normal   Communication Communication Communication: No difficulties   Cognition Arousal/Alertness: Awake/alert Behavior During Therapy: WFL for tasks assessed/performed Overall Cognitive Status: Within Functional Limits for tasks assessed                                                      Home Living Family/patient expects to be discharged to:: Private residence Living Arrangements: Parent Available Help at Discharge: Family Type of Home: House Home Access: Stairs to enter Technical brewer of Steps: 2 Entrance Stairs-Rails: None Home Layout: One level     Bathroom Shower/Tub: Teacher, early years/pre: Standard Bathroom Accessibility: Yes How Accessible: Accessible via wheelchair;Accessible via  walker Home Equipment: [place] - 2 wheels (RW reported per document review.)          Prior Functioning/Environment Level of Independence: Independent        Comments: independent at baseline with all ADLs , no assistive device used                      OT Goals(Current goals can be found in the care plan section) Acute Rehab OT Goals Patient Stated Goal: to go home                   Co-evaluation PT/OT/SLP Co-Evaluation/Treatment: Yes Reason for Co-Treatment: To address functional/ADL transfers   OT goals addressed during session: ADL's and self-care                       End of Session    Activity Tolerance: Patient tolerated treatment well Patient left: in bed;with call bell/phone within reach  OT Visit Diagnosis: Unsteadiness on feet (R26.81)                Time: 4540-9811 OT Time Calculation (min): 11 min Charges:  OT General Charges $OT Visit: 1 Visit OT Evaluation $OT Eval Low Complexity: [place] OT, MOT   Larey Seat 03/19/2021, 10:22 AM

## 2021-03-20 ENCOUNTER — Encounter (HOSPITAL_COMMUNITY): Payer: Self-pay | Admitting: Gastroenterology

## 2021-03-20 ENCOUNTER — Inpatient Hospital Stay (HOSPITAL_COMMUNITY): Payer: 59

## 2021-03-20 DIAGNOSIS — J690 Pneumonitis due to inhalation of food and vomit: Secondary | ICD-10-CM

## 2021-03-20 DIAGNOSIS — T17908A Unspecified foreign body in respiratory tract, part unspecified causing other injury, initial encounter: Secondary | ICD-10-CM

## 2021-03-20 LAB — CBC WITH DIFFERENTIAL/PLATELET
Abs Immature Granulocytes: 0.05 10*3/uL (ref 0.00–0.07)
Basophils Absolute: 0 10*3/uL (ref 0.0–0.1)
Basophils Relative: 0 %
Eosinophils Absolute: 0.1 10*3/uL (ref 0.0–0.5)
Eosinophils Relative: 1 %
HCT: 45.8 % (ref 39.0–52.0)
Hemoglobin: 15.1 g/dL (ref 13.0–17.0)
Immature Granulocytes: 1 %
Lymphocytes Relative: 18 %
Lymphs Abs: 2 10*3/uL (ref 0.7–4.0)
MCH: 30.2 pg (ref 26.0–34.0)
MCHC: 33 g/dL (ref 30.0–36.0)
MCV: 91.6 fL (ref 80.0–100.0)
Monocytes Absolute: 1 10*3/uL (ref 0.1–1.0)
Monocytes Relative: 9 %
Neutro Abs: 7.8 10*3/uL — ABNORMAL HIGH (ref 1.7–7.7)
Neutrophils Relative %: 71 %
Platelets: 228 10*3/uL (ref 150–400)
RBC: 5 MIL/uL (ref 4.22–5.81)
RDW: 12.7 % (ref 11.5–15.5)
WBC: 11 10*3/uL — ABNORMAL HIGH (ref 4.0–10.5)
nRBC: 0 % (ref 0.0–0.2)

## 2021-03-20 LAB — COMPREHENSIVE METABOLIC PANEL
ALT: 30 U/L (ref 0–44)
AST: 23 U/L (ref 15–41)
Albumin: 4 g/dL (ref 3.5–5.0)
Alkaline Phosphatase: 64 U/L (ref 38–126)
Anion gap: 9 (ref 5–15)
BUN: 10 mg/dL (ref 6–20)
CO2: 26 mmol/L (ref 22–32)
Calcium: 9.3 mg/dL (ref 8.9–10.3)
Chloride: 102 mmol/L (ref 98–111)
Creatinine, Ser: 1.22 mg/dL (ref 0.61–1.24)
GFR, Estimated: >60 mL/min (ref >60)
Glucose, Bld: 96 mg/dL (ref 70–99)
Potassium: 3.9 mmol/L (ref 3.5–5.1)
Sodium: 137 mmol/L (ref 135–145)
Total Bilirubin: 1.5 mg/dL — ABNORMAL HIGH (ref 0.3–1.2)
Total Protein: 7.4 g/dL (ref 6.5–8.1)

## 2021-03-20 LAB — MAGNESIUM: Magnesium: 2 mg/dL (ref 1.7–2.4)

## 2021-03-20 LAB — PHOSPHORUS: Phosphorus: 3.1 mg/dL (ref 2.5–4.6)

## 2021-03-20 LAB — SURGICAL PATHOLOGY

## 2021-03-20 LAB — GLUCOSE, CAPILLARY: Glucose-Capillary: 90 mg/dL (ref 70–99)

## 2021-03-20 NOTE — Discharge Summary (Signed)
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  Result Date: 03/20/2021 CLINICAL DATA:  Aspiration EXAM: PORTABLE CHEST 1 VIEW COMPARISON:  Mar 18, 2021 FINDINGS: The cardiomediastinal silhouette is unchanged in contour.Moderate to large hiatal hernia. Incidental note of an azygos fissure. No pleural effusion. No pneumothorax. Scattered bibasilar linear opacities consistent with atelectasis, increased in comparison to prior. Similar appearance of RIGHT upper lung perihilar heterogeneous opacity, nonspecific. Visualized abdomen is unremarkable. No acute osseous abnormality. IMPRESSION: 1. Increased bibasilar linear opacities, most consistent with atelectasis. 2. Unchanged RIGHT upper lung perihilar opacity, nonspecific with differential considerations including infection, aspiration or atelectasis. Electronically Signed   By: Valentino Saxon MD   On: 03/20/2021 07:58   DG Chest Port 1 View  Result Date: 03/17/2021 CLINICAL DATA:  40 year old male with gastric test no hemorrhage. EXAM: PORTABLE CHEST - 1 VIEW COMPARISON:  04/30/2010 FINDINGS: The mediastinal contours are  within normal limits. No cardiomegaly. Poor inspiratory effort. An azygos fissure is noted. The lungs are otherwise clear bilaterally without evidence of focal consolidation, pleural effusion, or pneumothorax. No acute osseous abnormality. IMPRESSION: Mild pulmonary vascular crowding due to poor inspiratory effort. No acute cardiopulmonary process. Electronically Signed   By: Ruthann Cancer MD   On: 03/17/2021 12:34   ECHOCARDIOGRAM COMPLETE  Result Date: 02/24/2021    ECHOCARDIOGRAM REPORT   40 Name:   Elijah Shaffer Date of Exam: 02/24/2021 Medical Rec #:  [id number]      Height:       75.0 in  Accession #:    [id number]     Weight:       245.0 lb Date of Birth:  Oct 29, 1981      BSA:          2.392 m 40 Age:    72 years       BP:           147/91 mmHg 40 Gender: M              HR:           63 bpm. Exam Location:  [facility] Procedure: 2D Echo, Cardiac Doppler and Color Doppler Indications:    TIA G45.9  History:        40 has no prior history of Echocardiogram examinations.                 Risk Factors:Hypertension. GERD.  Sonographer:    Alvino Chapel RCS Referring Phys: [place]  1. Left ventricular ejection fraction, by estimation, is 60 to 65%. The left ventricle has normal function. The left ventricle has no regional wall motion abnormalities. Left ventricular diastolic parameters were normal.  2. Right ventricular systolic function is normal. The right ventricular size is mildly enlarged.  3. The mitral valve is normal in structure. No evidence of mitral valve regurgitation.  4. The aortic valve is tricuspid. Aortic valve regurgitation is not visualized. No aortic stenosis is present.  5. Aortic Root at upper limit of normal for age and BSA. Aortic dilatation noted. There is borderline dilatation of the aortic root, measuring 39 mm. Comparison(s): No prior Echocardiogram. FINDINGS  Left Ventricle: Left ventricular ejection fraction, by estimation, is 60 to 65%.  The left ventricle has normal function. The left ventricle has no regional wall motion abnormalities. The left ventricular internal cavity size was normal in size. There is  no left ventricular hypertrophy. Left ventricular diastolic parameters were normal. Right Ventricle: The right ventricular size is mildly enlarged. No increase in right ventricular wall thickness. Right ventricular systolic function is normal. Left Atrium: Left atrial size was normal in size. Right Atrium: Right atrial size was normal in size. Pericardium: There is no evidence of pericardial effusion. Mitral Valve: The mitral valve is normal in structure. No evidence of mitral valve regurgitation. Tricuspid Valve: The tricuspid valve is normal in structure. Tricuspid valve regurgitation is trivial. No evidence of tricuspid stenosis. Aortic Valve: The aortic valve is tricuspid. Aortic valve regurgitation is not visualized. No aortic stenosis is present. Pulmonic Valve: The pulmonic valve was normal in structure. Pulmonic valve regurgitation is not visualized. No evidence of pulmonic stenosis. Aorta: Aortic Root at upper limit of normal for age and BSA. Aortic dilatation noted. There is borderline dilatation of the aortic root, measuring 39 mm. Pulmonary Artery: The pulmonary artery is of normal size. IAS/Shunts: The atrial septum is grossly normal.  LEFT VENTRICLE PLAX 2D LVIDd:         4.40 cm  Diastology LVIDs:         2.70 cm  LV e' medial:    7.94 cm/s LV PW:         0.90 cm  LV E/e' medial:  9.3 LV IVS:        0.90 cm  LV e' lateral:   10.00 cm/s LVOT diam:     2.10 cm  LV E/e' lateral: 7.4 LV SV:         76 LV SV Index:   32 LVOT Area:  3.46 cm  RIGHT VENTRICLE RV S prime:     11.60 cm/s TAPSE (M-mode): 2.0 cm LEFT ATRIUM             Index       RIGHT ATRIUM           Index LA diam:        2.60 cm 1.09 cm/m  RA Area:     15.10 cm LA Vol (A2C):   38.8 ml 16.22 ml/m RA Volume:   37.10 ml  15.51 ml/m LA Vol (A4C):   64.3 ml 26.88 ml/m  LA Biplane Vol: 52.6 ml 21.99 ml/m  AORTIC VALVE LVOT Vmax:   120.00 cm/s LVOT Vmean:  77.800 cm/s LVOT VTI:    0.220 m  AORTA Ao Root diam: 3.90 cm MITRAL VALVE MV Area (PHT): 3.28 cm    SHUNTS MV Decel Time: 231 msec    Systemic VTI:  0.22 m MV E velocity: 74.00 cm/s  Systemic Diam: 2.10 cm MV A velocity: 66.20 cm/s MV E/A ratio:  1.12 Rudean Haskell MD Electronically signed by Rudean Haskell MD Signature Date/Time: 02/24/2021/12:32:56 PM    Final    CT HEAD CODE STROKE WO CONTRAST  Result Date: 02/23/2021 CLINICAL DATA:  Code stroke. Neuro deficit, acute, stroke suspected. Additional history provided: Abdominal pain and right hand tingling as well as left-sided facial numbness. EXAM: CT HEAD WITHOUT CONTRAST TECHNIQUE: Contiguous axial images were obtained from the base of the skull through the vertex without intravenous contrast. COMPARISON:  No pertinent prior exams available for comparison. FINDINGS: Brain: Cerebral volume is normal. There is no acute intracranial hemorrhage. No demarcated cortical infarct. No extra-axial fluid collection. No evidence of intracranial mass. No midline shift. Vascular: No hyperdense vessel. Skull: Normal. Negative for fracture or focal lesion. Sinuses/Orbits: Visualized orbits show no acute finding. Small left maxillary sinus mucous retention cyst. ASPECTS ([place] Stroke Program Early CT Score) - Ganglionic level infarction (caudate, lentiform nuclei, internal capsule, insula, M1-M3 cortex): 7 - Supraganglionic infarction (M4-M6 cortex): 3 Total score (0-10 with 10 being normal): 10 These results were called by telephone at the time of interpretation on 02/23/2021 at 3:03 pm to provider Dr. Roderic Palau, who verbally acknowledged these results. IMPRESSION: No evidence of acute intracranial abnormality. ASEPCTS is 10. Electronically Signed   By: Kellie Simmering DO   On: 02/23/2021 15:04     Subjective: Seen and examined at bedside and doing much better today.   Headache has resolved and he is not dizzy.  Denies any lightheadedness or dizziness.  No other concerns or questions time is stable for discharge and will follow up with his PCP and repeat chest x-ray in 3 to 6 weeks.  He was advised to come back to the ED if he had any issues and he was also told to follow-up with neurology in outpatient setting.  Discharge Exam: Vitals:   03/19/21 2019 03/20/21 0603  BP: 124/68 126/72  Pulse: 75 72  Resp: 18 18  Temp: 99.6 F (37.6 C) 97.6 F (36.4 C)  SpO2: 93% 94%   Vitals:   03/19/21 1437 03/19/21 2019 03/20/21 0500 03/20/21 0603  BP: 128/72 124/68  126/72  Pulse: 72 75  72  Resp: 17 18  18   Temp: 97.9 F (36.6 C) 99.6 F (37.6 C)  97.6 F (36.4 C)  TempSrc: Oral     SpO2: 95% 93%  94%  Weight:   107.6 kg   Height:       General:  Pt is alert, awake, not in acute distress Cardiovascular: RRR, S1/S2 +, no rubs, no gallops Respiratory: Diminished bilaterally more so on the right compared to left, no wheezing, no rhonchi Abdominal: Soft, NT, distended secondary body habitus, bowel sounds + Extremities: no edema, no cyanosis  The results of significant diagnostics from this hospitalization (including imaging, microbiology, ancillary and laboratory) are listed below for reference.    Microbiology: Recent Results (from the past 240 hour(s))  Resp Panel by RT-PCR (Flu A&B, Covid) Nasopharyngeal Swab     Status: None   Collection Time: 03/17/21  2:55 PM   Specimen: Nasopharyngeal Swab; Nasopharyngeal(NP) swabs in vial transport medium  Result Value Ref Range Status   SARS Coronavirus 2 by RT PCR NEGATIVE NEGATIVE Final    Comment: (NOTE) SARS-CoV-2 target nucleic acids are NOT DETECTED.  The SARS-CoV-2 RNA is generally detectable in upper respiratory specimens during the acute phase of infection. The lowest concentration of SARS-CoV-2 viral copies this assay can detect is 138 copies/mL. A negative result does not preclude  SARS-Cov-2 infection and should not be used as the sole basis for treatment or other patient management decisions. A negative result may occur with  improper specimen collection/handling, submission of specimen other than nasopharyngeal swab, presence of viral mutation(s) within the areas targeted by this assay, and inadequate number of viral copies(<138 copies/mL). A negative result must be combined with clinical observations, patient history, and epidemiological information. The expected result is Negative.  Fact Sheet for 40s:  EntrepreneurPulse.com.au  Fact Sheet for Healthcare Providers:  IncredibleEmployment.be  This test is no t yet approved or cleared by the [place] FDA and  has been authorized for detection and/or diagnosis of SARS-CoV-2 by FDA under an Emergency Use Authorization (EUA). This EUA will remain  in effect (meaning this test can be used) for the duration of the COVID-19 declaration under Section 564(b)(1) of the Act, 21 U.S.C.section 360bbb-3(b)(1), unless the authorization is terminated  or revoked sooner.       Influenza A by PCR NEGATIVE NEGATIVE Final   Influenza B by PCR NEGATIVE NEGATIVE Final    Comment: (NOTE) The Xpert Xpress SARS-CoV-2/FLU/RSV plus assay is intended as an aid in the diagnosis of influenza from Nasopharyngeal swab specimens and should not be used as a sole basis for treatment. Nasal washings and aspirates are unacceptable for Xpert Xpress SARS-CoV-2/FLU/RSV testing.  Fact Sheet for 40s: EntrepreneurPulse.com.au  Fact Sheet for Healthcare Providers: IncredibleEmployment.be  This test is not yet approved or cleared by the [place] FDA and has been authorized for detection and/or diagnosis of SARS-CoV-2 by FDA under an Emergency Use Authorization (EUA). This EUA will remain in effect (meaning this test can be used) for the duration of  the COVID-19 declaration under Section 564(b)(1) of the Act, 21 U.S.C. section 360bbb-3(b)(1), unless the authorization is terminated or revoked.  Performed at [facility], [address], [place],  [address]     Labs: BNP (last 3 results) Recent Labs    03/17/21 1222  BNP 123XX123   Basic Metabolic Panel: Recent Labs  Lab 03/17/21 1222 03/18/21 0357 03/19/21 0455 03/20/21 0538  NA 138 139 137 137  K 3.9 3.4* 3.7 3.9  CL 108 105 103 102  CO2 19* 25 27 26   GLUCOSE 120* 101* 98 96  BUN 13 12 11 10   CREATININE 1.40* 1.27* 1.16 1.22  CALCIUM 9.4 8.5* 9.0 9.3  MG  --  2.3 1.9 2.0  PHOS  --   --  2.8 3.1   Liver Function Tests: Recent Labs  Lab 03/17/21 1222 03/18/21 0357 03/19/21 0455 03/20/21 0538  AST 25 20 23 23   ALT 34 26 28 30   ALKPHOS 60 52 53 64  BILITOT 0.8 1.7* 1.4* 1.5*  PROT 7.3 6.4* 6.8 7.4  ALBUMIN 4.5 3.7 3.8 4.0   Recent Labs  Lab 03/17/21 1222  LIPASE 33   No results for input(s): AMMONIA in the last 168 hours. CBC: Recent Labs  Lab 03/17/21 1222 03/18/21 0357 03/19/21 0455 03/20/21 0538  WBC 10.9* 13.7* 12.2* 11.0*  NEUTROABS 5.8  --  8.5* 7.8*  HGB 16.2 14.0 14.4 15.1  HCT 48.1 42.3 44.3 45.8  MCV 91.3 92.4 92.1 91.6  PLT 308 217 215 228   Cardiac Enzymes: Recent Labs  Lab 03/17/21 1222  CKTOTAL 162   BNP: Invalid input(s): POCBNP CBG: Recent Labs  Lab 03/17/21 1201 03/18/21 0545 03/19/21 0541 03/20/21 0603  GLUCAP 105* 104* 100* 90   D-Dimer No results for input(s): DDIMER in the last 72 hours. Hgb A1c No results for input(s): HGBA1C in the last 72 hours. Lipid Profile No results for input(s): CHOL, HDL, LDLCALC, TRIG, CHOLHDL, LDLDIRECT in the last 72 hours. Thyroid function studies No results for input(s): TSH, T4TOTAL, T3FREE, THYROIDAB in the last 72 hours.  Invalid input(s): FREET3 Anemia work up No results for input(s): VITAMINB12, FOLATE, FERRITIN, TIBC, IRON, RETICCTPCT in the last 72  hours. Urinalysis    Component Value Date/Time   COLORURINE STRAW (A) 03/17/2021 1455   APPEARANCEUR CLEAR 03/17/2021 1455   LABSPEC 1.013 03/17/2021 1455   PHURINE 6.0 03/17/2021 1455   GLUCOSEU NEGATIVE 03/17/2021 1455   HGBUR SMALL (A) 03/17/2021 1455   BILIRUBINUR NEGATIVE 03/17/2021 1455   KETONESUR NEGATIVE 03/17/2021 1455   PROTEINUR 30 (A) 03/17/2021 1455   UROBILINOGEN 0.2 04/18/2010 2028   NITRITE NEGATIVE 03/17/2021 1455   LEUKOCYTESUR NEGATIVE 03/17/2021 1455   Sepsis Labs Invalid input(s): PROCALCITONIN,  WBC,  LACTICIDVEN Microbiology Recent Results (from the past 240 hour(s))  Resp Panel by RT-PCR (Flu A&B, Covid) Nasopharyngeal Swab     Status: None   Collection Time: 03/17/21  2:55 PM   Specimen: Nasopharyngeal Swab; Nasopharyngeal(NP) swabs in vial transport medium  Result Value Ref Range Status   SARS Coronavirus 2 by RT PCR NEGATIVE NEGATIVE Final    Comment: (NOTE) SARS-CoV-2 target nucleic acids are NOT DETECTED.  The SARS-CoV-2 RNA is generally detectable in upper respiratory specimens during the acute phase of infection. The lowest concentration of SARS-CoV-2 viral copies this assay can detect is 138 copies/mL. A negative result does not preclude SARS-Cov-2 infection and should not be used as the sole basis for treatment or other patient management decisions. A negative result may occur with  improper specimen collection/handling, submission of specimen other than nasopharyngeal swab, presence of viral mutation(s) within the areas targeted by this assay, and inadequate number of viral copies(<138 copies/mL). A negative result must be combined with clinical observations, patient history, and epidemiological information. The expected result is Negative.  Fact Sheet for 40s:  EntrepreneurPulse.com.au  Fact Sheet for Healthcare Providers:  IncredibleEmployment.be  This test is no t yet approved or cleared by  the [place] FDA and  has been authorized for detection and/or diagnosis of SARS-CoV-2 by FDA under an Emergency Use Authorization (EUA). This EUA will remain  in effect (meaning this test can be used) for the duration of the COVID-19 declaration under Section 564(b)(1) of the Act, 21 U.S.C.section  360bbb-3(b)(1), unless the authorization is terminated  or revoked sooner.       Influenza A by PCR NEGATIVE NEGATIVE Final   Influenza B by PCR NEGATIVE NEGATIVE Final    Comment: (NOTE) The Xpert Xpress SARS-CoV-2/FLU/RSV plus assay is intended as an aid in the diagnosis of influenza from Nasopharyngeal swab specimens and should not be used as a sole basis for treatment. Nasal washings and aspirates are unacceptable for Xpert Xpress SARS-CoV-2/FLU/RSV testing.  Fact Sheet for 40s: EntrepreneurPulse.com.au  Fact Sheet for Healthcare Providers: IncredibleEmployment.be  This test is not yet approved or cleared by the [place] FDA and has been authorized for detection and/or diagnosis of SARS-CoV-2 by FDA under an Emergency Use Authorization (EUA). This EUA will remain in effect (meaning this test can be used) for the duration of the COVID-19 declaration under Section 564(b)(1) of the Act, 21 U.S.C. section 360bbb-3(b)(1), unless the authorization is terminated or revoked.  Performed at [facility], [address] [place], [place] [address]    Time coordinating discharge: 35 minutes  SIGNED:  Kerney Elbe, [facility] 03/20/2021, 5:30 PM Pager is on [place]  If 7PM-7AM, please contact night-coverage [email or web address]

## 2021-03-21 ENCOUNTER — Telehealth (INDEPENDENT_AMBULATORY_CARE_PROVIDER_SITE_OTHER): Payer: Self-pay | Admitting: *Deleted

## 2021-03-21 NOTE — Telephone Encounter (Signed)
Noted  

## 2021-03-21 NOTE — Telephone Encounter (Signed)
Per op  Note patient need repeat EGD in 2 months

## 2021-04-11 ENCOUNTER — Other Ambulatory Visit: Payer: Self-pay

## 2021-04-11 ENCOUNTER — Ambulatory Visit (INDEPENDENT_AMBULATORY_CARE_PROVIDER_SITE_OTHER): Payer: 59 | Admitting: Gastroenterology

## 2021-04-11 ENCOUNTER — Telehealth (INDEPENDENT_AMBULATORY_CARE_PROVIDER_SITE_OTHER): Payer: Self-pay

## 2021-04-11 ENCOUNTER — Other Ambulatory Visit (INDEPENDENT_AMBULATORY_CARE_PROVIDER_SITE_OTHER): Payer: Self-pay

## 2021-04-11 ENCOUNTER — Encounter (INDEPENDENT_AMBULATORY_CARE_PROVIDER_SITE_OTHER): Payer: Self-pay | Admitting: Gastroenterology

## 2021-04-11 ENCOUNTER — Encounter (INDEPENDENT_AMBULATORY_CARE_PROVIDER_SITE_OTHER): Payer: Self-pay

## 2021-04-11 VITALS — BP 129/83 | HR 80 | Temp 98.2°F | Ht 75.0 in | Wt 233.0 lb

## 2021-04-11 DIAGNOSIS — K920 Hematemesis: Secondary | ICD-10-CM | POA: Diagnosis not present

## 2021-04-11 DIAGNOSIS — K219 Gastro-esophageal reflux disease without esophagitis: Secondary | ICD-10-CM | POA: Diagnosis not present

## 2021-04-11 DIAGNOSIS — K2270 Barrett's esophagus without dysplasia: Secondary | ICD-10-CM | POA: Insufficient documentation

## 2021-04-11 DIAGNOSIS — Z860100 Personal history of colon polyps, unspecified: Secondary | ICD-10-CM | POA: Insufficient documentation

## 2021-04-11 DIAGNOSIS — K229 Disease of esophagus, unspecified: Secondary | ICD-10-CM

## 2021-04-11 MED ORDER — PEG 3350-KCL-NA BICARB-NACL 420 G PO SOLR: 4000.0000 mL | ORAL | 0 refills | Status: DC

## 2021-04-11 NOTE — Progress Notes (Signed)
Maylon Peppers, M.D. Gastroenterology & Hepatology [facility] [address] [place], [place] [address]  Primary Care Physician: Jani Gravel, MD [place]  [id number]  I will communicate my assessment and recommendations to the referring MD via EMR.  Problems: 1. Recent episodes of UGIB 2. Esophageal plaque in lower third - MWT vs dysplasia/malignancy 3. 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 Barrett's esophagus without dysplasia  History of Present Illness: Elijah Shaffer is a 40 y.o. male with history of GERD, history of diverticulitis and anxiety, who comes to the clinic for evaluation of recent hospitalization for coffee-ground emesis.  Patient was admitted to the hospital on 03/17/2021 after presenting an episode of syncope which was followed by large coffee-ground emesis both inside of the hospital in the ED.  His hemoglobin was stable at 16.2 but he presented multiple episodes where he has sensation was decided to be taken for esophagogastroduodenospy.  Notably, he had a history of ibuprofen intake at least 3 times a month for headaches.  Patient underwent an EGD on 03/18/2021 which showed presence of a 3 cm circumferential salmon-colored changes concerning for Barrett's esophagus (this was confirmed with biopsy, no presence of dysplasia was found).  There was also presence of plaque in the distal esophagus with some fibrin and violaceous discoloration (unknown if due to Mallory-Weiss tear or dysplastic changes).  Was presence of a small hiatal hernia.  There was a single area of ongoing bleeding in the gastric body which raised concern for Dieulafoy lesion which was ablated with gold probe.  Duodenum was normal.  Patient was discharged on PPI twice daily.  Notably, during his hospitalization he was found to have some possible aspiration pneumonia for which he was put on antibiotics.  Patient endorses that he has some  cough at the moment but it is less frequent than in the past and it is slowly resolving.  Reports that sometimes he produces some phlegm but most of times is dry. Has felt some lightheadedness while working but has not presented any more episodes of syncope. 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 The patient denies having any nausea, vomiting, fever, chills, hematochezia, melena, hematemesis, abdominal distention, abdominal pain, diarrhea, jaundice, pruritus or weight loss.  He currently takes Protonix 40 mg BID compliantly, at least 30 minutes before breakfast.Denies any NSAIDs.  Last Colonoscopy: 09/2017-- Two 4 to 6 mm polyps in the transverse colon and in the ascending colon, removed with a cold snare. Resected and retrieved. - Two 7 to 8 mm polyps in the transverse colon. Clips (MR conditional) were placed. - Diverticulosis in the entire examined colon. - Internal hemorrhoids. All polyps were TA. Repeat in 3 years.  Past Medical History: Past Medical History:  Diagnosis Date  . Acute renal injury ([place]) 06/28/2017  . Anxiety   . Diverticulitis   . GERD (gastroesophageal reflux disease) 01/08/2018  . Seasonal allergies     Past Surgical History: Past Surgical History:  Procedure Laterality Date  . BIOPSY  03/18/2021   Procedure: BIOPSY;  Surgeon: Harvel Quale, MD;  [facility];  Service: Gastroenterology;;  esophageal at [facility]  . COLONOSCOPY N/A 09/22/2017   Procedure: COLONOSCOPY;  Surgeon: Rogene Houston, MD;  [facility];  Service: Endoscopy;  Laterality: N/A;  9:55  . ESOPHAGOGASTRODUODENOSCOPY (EGD) WITH PROPOFOL N/A 03/18/2021   Procedure: ESOPHAGOGASTRODUODENOSCOPY (EGD) WITH  PROPOFOL;  Surgeon: Montez Morita, Quillian Quince, MD;  [facility];  Service: Gastroenterology;  Laterality: N/A;  . POLYPECTOMY  09/22/2017   Procedure: POLYPECTOMY;  Surgeon:  Rogene Houston, MD;  [facility];  Service: Endoscopy;;    Family History: Family History  Problem Relation Age of Onset  . Colon cancer Maternal Grandmother   . Colon cancer Maternal Grandfather   . Healthy Mother   . Healthy Father     Social History: Social History   Tobacco Use  Smoking Status Never Smoker  Smokeless Tobacco Never Used   Social History   Substance and Sexual Activity  Alcohol Use Yes   Comment: occasional   Social History   Substance and Sexual Activity  Drug Use No    Allergies: Allergies  Allergen Reactions  . Other Other (See Comments)    seeds  . Claritin [Loratadine] Other (See Comments)    Heart race    Medications: Current Outpatient Medications  Medication Sig Dispense Refill  . acetaminophen (TYLENOL) 500 MG tablet Take 500 mg by mouth every 6 (six) hours as needed.    . cetirizine (ZYRTEC) 10 MG tablet Take 10 mg by mouth daily.    . fluticasone (FLONASE ALLERGY RELIEF) 50 MCG/ACT nasal spray Place 2 sprays into both nostrils daily. 16 g 2  . hydrOXYzine (ATARAX/VISTARIL) 10 MG tablet Take 1 tablet (10 mg total) by mouth 3 (three) times daily as needed for anxiety. 30 tablet 0  . ondansetron (ZOFRAN) 4 MG tablet Take 1 tablet (4 mg total) by mouth every 6 (six) hours as needed for nausea. 20 tablet 0  . pantoprazole (PROTONIX) 40 MG tablet Take 1 tablet (40 mg total) by mouth 2 (two) times daily. 180 tablet 0  . polyethylene glycol (MIRALAX / GLYCOLAX) 17 g packet Take 17 g by mouth daily as needed for mild constipation. 14 each 0  . promethazine-dextromethorphan (PROMETHAZINE-DM) 6.25-15 MG/5ML syrup Take 5 mLs by mouth 3 (three) times daily as needed for cough. 118 mL 0  . triamcinolone ointment (KENALOG) 0.5 % Apply 1 application topically daily as needed (itching).     No current facility-administered medications for this visit.    Review of Systems: GENERAL: negative for malaise, night sweats HEENT: No changes  in hearing or vision, no nose bleeds or other nasal problems. NECK: Negative for lumps, goiter, pain and significant neck swelling RESPIRATORY: Negative for cough, wheezing CARDIOVASCULAR: Negative for chest pain, leg swelling, palpitations, orthopnea GI: SEE HPI MUSCULOSKELETAL: Negative for joint pain or swelling, back pain, and muscle pain. SKIN: Negative for lesions, rash PSYCH: Negative for sleep disturbance, mood disorder and recent psychosocial stressors. HEMATOLOGY Negative for prolonged bleeding, bruising easily, and swollen nodes. ENDOCRINE: Negative for cold or heat intolerance, polyuria, polydipsia and goiter. NEURO: negative for tremor, gait imbalance, syncope and seizures. The remainder of the review of systems is noncontributory.   Physical Exam: BP 129/83 (BP Location: Left Arm, Patient Position: Sitting, Cuff Size: Large)   Pulse 80   Temp 98.2 F (36.8 C) (Oral)   Ht 6\' 3"  (1.905 m)   Wt 233 lb (105.7 kg)   BMI 29.12 kg/m  GENERAL: The patient is AO x3, in no acute distress. HEENT: Head is normocephalic and atraumatic. EOMI are intact. Mouth is well hydrated and without lesions. NECK: Supple. No masses LUNGS: Clear to auscultation. No presence of rhonchi/wheezing/rales. Adequate chest expansion HEART: RRR, normal s1 and s2. ABDOMEN: Soft, nontender, no guarding, no peritoneal signs,  and nondistended. BS +. No masses. EXTREMITIES: Without any cyanosis, clubbing, rash, lesions or edema. NEUROLOGIC: AOx3, no focal motor deficit. SKIN: no jaundice, no rashes  Imaging/Labs: as above  I personally reviewed and interpreted the available labs, imaging and endoscopic files.  Impression and Plan: JODI KAPPES is a 40 y.o. male with history of GERD, history of diverticulitis and anxiety, who comes to the clinic for evaluation of recent hospitalization for coffee-ground emesis.  The patient had an episode of possible upper gastrointestinal bleeding due to Mallory-Weiss  tear based on endoscopic investigations performed during his most recent hospitalization.  This was a self-limited episode has not recurred.  He has tolerated his PPI compliantly.  However, given the fact that it was not clear if the primary lesion was Mallory-Weiss tear or dysplastic tissue in the distal esophagus, will need to have repeat EGD in mid July.  For now, I recommended continuing his PPI twice a day, which will also control his GERD.  He has present but esophagus but no presence of dysplasia.  I have thorough discussion with the patient regarding the risk of progression to esophageal cancer, for which we will repeat an esophagogastroduodenospy in 3 years.  Finally, patient is due for colonoscopy as he had presence of multiple polyps which will be ordered today.  - Schedule EGD and colonoscopy - Continue pantoprazole 40 mg twice a day - Avoid NSAIDs - Explained presumed etiology of reflux symptoms. Instruction provided in the use of antireflux medication - patient should take medication in the morning 30-45 minutes before eating breakfast. Discussed avoidance of eating within 2 hours of lying down to sleep and benefit of blocks to elevate head of bed.  All questions were answered.      Harvel Quale, MD Gastroenterology and Hepatology [facility]

## 2021-04-11 NOTE — Patient Instructions (Addendum)
Schedule EGD and colonoscopy Continue pantoprazole 40 mg twice a day Avoid NSAIDs Explained presumed etiology of reflux symptoms. Instruction provided in the use of antireflux medication - patient should take medication in the morning 30-45 minutes before eating breakfast. Discussed avoidance of eating within 2 hours of lying down to sleep and benefit of blocks to elevate head of bed.

## 2021-04-11 NOTE — Telephone Encounter (Signed)
LeighAnn Ashlan Dignan, CMA  

## 2021-04-23 ENCOUNTER — Other Ambulatory Visit (HOSPITAL_COMMUNITY): Payer: Self-pay | Admitting: Family Medicine

## 2021-04-23 ENCOUNTER — Other Ambulatory Visit: Payer: Self-pay

## 2021-04-23 ENCOUNTER — Ambulatory Visit (HOSPITAL_COMMUNITY)
Admission: RE | Admit: 2021-04-23 | Discharge: 2021-04-23 | Disposition: A | Discharge status: Home / Self Care | Payer: 59 | Source: Ambulatory Visit | Attending: Family Medicine | Admitting: Family Medicine

## 2021-04-23 DIAGNOSIS — J690 Pneumonitis due to inhalation of food and vomit: Secondary | ICD-10-CM | POA: Diagnosis not present

## 2021-05-04 ENCOUNTER — Encounter: Payer: Self-pay | Admitting: Neurology

## 2021-05-23 ENCOUNTER — Ambulatory Visit (HOSPITAL_COMMUNITY): Payer: 59 | Admitting: Certified Registered"

## 2021-05-23 ENCOUNTER — Other Ambulatory Visit: Payer: Self-pay

## 2021-05-23 ENCOUNTER — Ambulatory Visit (HOSPITAL_COMMUNITY)
Admission: RE | Admit: 2021-05-23 | Discharge: 2021-05-23 | Disposition: A | Discharge status: Home / Self Care | Payer: 59 | Attending: Gastroenterology | Admitting: Gastroenterology

## 2021-05-23 ENCOUNTER — Encounter (HOSPITAL_COMMUNITY): Payer: Self-pay | Admitting: Gastroenterology

## 2021-05-23 ENCOUNTER — Encounter (HOSPITAL_COMMUNITY)
Admission: RE | Disposition: A | Discharge status: Home / Self Care | Payer: Self-pay | Source: Home / Self Care | Attending: Gastroenterology

## 2021-05-23 DIAGNOSIS — K219 Gastro-esophageal reflux disease without esophagitis: Secondary | ICD-10-CM | POA: Diagnosis not present

## 2021-05-23 DIAGNOSIS — K648 Other hemorrhoids: Secondary | ICD-10-CM | POA: Diagnosis not present

## 2021-05-23 DIAGNOSIS — Z8719 Personal history of other diseases of the digestive system: Secondary | ICD-10-CM | POA: Diagnosis present

## 2021-05-23 DIAGNOSIS — Z888 Allergy status to other drugs, medicaments and biological substances status: Secondary | ICD-10-CM | POA: Diagnosis not present

## 2021-05-23 DIAGNOSIS — Z86010 Personal history of colonic polyps: Secondary | ICD-10-CM | POA: Insufficient documentation

## 2021-05-23 DIAGNOSIS — K449 Diaphragmatic hernia without obstruction or gangrene: Secondary | ICD-10-CM | POA: Diagnosis not present

## 2021-05-23 DIAGNOSIS — Z1211 Encounter for screening for malignant neoplasm of colon: Secondary | ICD-10-CM | POA: Insufficient documentation

## 2021-05-23 DIAGNOSIS — Z79899 Other long term (current) drug therapy: Secondary | ICD-10-CM | POA: Diagnosis not present

## 2021-05-23 DIAGNOSIS — K5730 Diverticulosis of large intestine without perforation or abscess without bleeding: Secondary | ICD-10-CM | POA: Insufficient documentation

## 2021-05-23 DIAGNOSIS — K2270 Barrett's esophagus without dysplasia: Secondary | ICD-10-CM

## 2021-05-23 HISTORY — PX: BIOPSY: SHX5522

## 2021-05-23 HISTORY — PX: ESOPHAGOGASTRODUODENOSCOPY (EGD) WITH PROPOFOL: SHX5813

## 2021-05-23 HISTORY — PX: COLONOSCOPY WITH PROPOFOL: SHX5780

## 2021-05-23 SURGERY — COLONOSCOPY WITH PROPOFOL
Anesthesia: General

## 2021-05-23 MED ORDER — PROPOFOL 10 MG/ML IV BOLUS
INTRAVENOUS | Status: AC
  Filled 2021-05-23: qty 240

## 2021-05-23 MED ORDER — LACTATED RINGERS IV SOLN: INTRAVENOUS | Status: DC | PRN

## 2021-05-23 MED ORDER — PROPOFOL 10 MG/ML IV BOLUS
INTRAVENOUS | Status: DC | PRN
  Administered 2021-05-23: 100 mg via INTRAVENOUS
  Administered 2021-05-23: 150 ug/kg/min via INTRAVENOUS

## 2021-05-23 MED ORDER — PANTOPRAZOLE SODIUM 40 MG PO TBEC: 40.0000 mg | DELAYED_RELEASE_TABLET | Freq: Two times a day (BID) | ORAL | 3 refills | Status: DC

## 2021-05-23 MED ORDER — LIDOCAINE HCL (PF) 2 % IJ SOLN
INTRAMUSCULAR | Status: AC
  Filled 2021-05-23: qty 5

## 2021-05-23 MED ORDER — STERILE WATER FOR IRRIGATION IR SOLN
Status: DC | PRN
  Administered 2021-05-23: 1.5 mL

## 2021-05-23 MED ORDER — LIDOCAINE HCL (CARDIAC) PF 100 MG/5ML IV SOSY
PREFILLED_SYRINGE | INTRAVENOUS | Status: DC | PRN
  Administered 2021-05-23: 100 mg via INTRAVENOUS

## 2021-05-23 NOTE — H&P (Signed)
Elijah Shaffer is an 40 y.o. male.   Chief Complaint: follow up MW tear and history of polyps HPI: Elijah Shaffer is a 40 y.o. 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  Denies melena, hematochezia, hematemesis, heartburn or regurgitation.  He is having episodes of throat discomfort but no odynophagia and occasional dysphagia with liquids.  Past Medical History:  Diagnosis Date   Acute renal injury ([place]) 06/28/2017   Anxiety    Diverticulitis    GERD (gastroesophageal reflux disease) 01/08/2018   Seasonal allergies     Past Surgical History:  Procedure Laterality Date   BIOPSY  03/18/2021   Procedure: BIOPSY;  Surgeon: Harvel Quale, MD;  [facility];  Service: Gastroenterology;;  esophageal at [facility]-line   COLONOSCOPY N/A 09/22/2017   Procedure: COLONOSCOPY;  Surgeon: Rogene Houston, MD;  [facility];  Service: Endoscopy;  Laterality: N/A;  9:55   ESOPHAGOGASTRODUODENOSCOPY (EGD) WITH PROPOFOL N/A 03/18/2021   Procedure: ESOPHAGOGASTRODUODENOSCOPY (EGD) WITH PROPOFOL;  Surgeon: Harvel Quale, MD;  [facility];  Service: Gastroenterology;  Laterality: N/A;   POLYPECTOMY  09/22/2017   Procedure: POLYPECTOMY;  Surgeon: Rogene Houston, MD;  [facility];  Service: Endoscopy;;    Family History  Problem Relation Age of Onset   Colon cancer Maternal Grandmother    Colon cancer Maternal Grandfather    Healthy Mother    Healthy Father    Social History:  reports that he has never smoked. He has never used smokeless tobacco. He reports current  alcohol use. He reports that he does not use drugs.  Allergies:  Allergies  Allergen Reactions   Other Other (See Comments)    Seeds/diverticulitis   Claritin [Loratadine] Other (See Comments)    Heart race    Medications Prior to Admission  Medication Sig Dispense Refill   acetaminophen (TYLENOL) 500 MG tablet Take 500 mg by mouth every 6 (six) hours as needed.     albuterol (VENTOLIN HFA) 108 (90 Base) MCG/ACT inhaler Inhale 2 puffs into the lungs every 6 (six) hours as needed for shortness of breath.     b complex vitamins capsule Take 1 capsule by mouth daily. With D3 gummie     cetirizine (ZYRTEC) 10 MG tablet Take 10 mg by mouth daily.     cyanocobalamin 1000 MCG tablet Take 1,000 mcg by mouth daily. Patch     docusate sodium (COLACE) 100 MG capsule Take 100 mg by mouth daily.     fluticasone (FLONASE ALLERGY RELIEF) 50 MCG/ACT nasal spray Place 2 sprays into both nostrils daily. (Patient taking differently: Place 2 sprays into both nostrils daily as needed for allergies or rhinitis.) 16 g 2   hydrOXYzine (ATARAX/VISTARIL) 10 MG tablet Take 1 tablet (10 mg total) by mouth 3 (three) times daily as needed for anxiety. 30 tablet 0   Multiple Vitamins-Minerals (MULTIVITAMIN WITH MINERALS) tablet Take 1 tablet by mouth daily. Patch     omega-3 acid ethyl esters (LOVAZA) 1 g capsule Take 1 g by mouth daily.     pantoprazole (PROTONIX) 40 MG tablet Take 1 tablet (40 mg total) by mouth  2 (two) times daily. 180 tablet 0   triamcinolone ointment (KENALOG) 0.5 % Apply 1 application topically daily as needed (itching).     guaiFENesin (MUCINEX) 600 MG 12 hr tablet Take 600 mg by mouth 2 (two) times daily as needed for to loosen phlegm.     ondansetron (ZOFRAN) 4 MG tablet Take 1 tablet (4 mg total) by mouth every 6 (six) hours as needed for nausea. 20 tablet 0   polyethylene glycol-electrolytes (TRILYTE) 420 g solution Take 4,000 mLs by mouth as directed. 4000 mL 0    promethazine-dextromethorphan (PROMETHAZINE-DM) 6.25-15 MG/5ML syrup Take 5 mLs by mouth 3 (three) times daily as needed for cough. (Patient not taking: Reported on 05/16/2021) 118 mL 0    No results found for this or any previous visit (from the past 48 hour(s)). No results found.  Review of Systems  Constitutional: Negative.   HENT: Negative.    Eyes: Negative.   Respiratory: Negative.    Cardiovascular: Negative.   Gastrointestinal: Negative.   Endocrine: Negative.   Genitourinary: Negative.   Musculoskeletal: Negative.   Skin: Negative.   Allergic/Immunologic: Negative.   Neurological: Negative.   Hematological: Negative.   Psychiatric/Behavioral: Negative.     Blood pressure (!) 137/92, temperature 98.3 F (36.8 C), temperature source Oral, resp. rate 17, height 6\' 3"  (1.905 m), weight 108.9 kg, SpO2 97 %. Physical Exam  GENERAL: The patient is AO x3, in no acute distress. HEENT: Head is normocephalic and atraumatic. EOMI are intact. Mouth is well hydrated and without lesions. NECK: Supple. No masses LUNGS: Clear to auscultation. No presence of rhonchi/wheezing/rales. Adequate chest expansion HEART: RRR, normal s1 and s2. ABDOMEN: Soft, nontender, no guarding, no peritoneal signs, and nondistended. BS +. No masses. EXTREMITIES: Without any cyanosis, clubbing, rash, lesions or edema. NEUROLOGIC: AOx3, no focal motor deficit. SKIN: no jaundice, no rashes  Assessment/Plan Elijah Shaffer is a 40 y.o. male with history of GERD complicated by nondysplastic Barrett's esophagus and recent episode of upper gastrointestinal bleeding due to possible Mallory-Weiss tear, history of diverticulitis and anxiety, who comes to the hospital for follow-up of Mallory-Weiss tear and surveillance of history of polyps.  We will proceed with EGD and colonoscopy.  Harvel Quale, MD 05/23/2021, 7:33 AM

## 2021-05-23 NOTE — Transfer of Care (Signed)
Immediate Anesthesia Transfer of Care Note  Patient: Elijah Shaffer  Procedure(s) Performed: COLONOSCOPY WITH PROPOFOL ESOPHAGOGASTRODUODENOSCOPY (EGD) WITH PROPOFOL BIOPSY  Patient Location: Endoscopy Unit  Anesthesia Type:General  Level of Consciousness: awake, alert , oriented, patient cooperative and responds to stimulation  Airway & Oxygen Therapy: Patient Spontanous Breathing  Post-op Assessment: Report given to RN, Post -op Vital signs reviewed and stable and Patient moving all extremities  Post vital signs: Reviewed and stable  Last Vitals:  Vitals Value Taken Time  BP    Temp 36.8 C 05/23/21 0831  Pulse    Resp 18 05/23/21 0831  SpO2 95 % 05/23/21 0831    Last Pain:  Vitals:   05/23/21 0831  TempSrc: Oral  PainSc:       Patients Stated Pain Goal: 8 (55/37/48 2707)  Complications: No notable events documented.

## 2021-05-23 NOTE — Anesthesia Preprocedure Evaluation (Addendum)
Anesthesia Evaluation  Patient identified by MRN, date of birth, ID band Patient awake    Reviewed: Allergy & Precautions, H&P , NPO status , Patient's Chart, lab work & pertinent test results, reviewed documented beta blocker date and time   Airway Mallampati: II  TM Distance: >3 FB Neck ROM: full    Dental no notable dental hx. (+) Teeth Intact, Dental Advisory Given   Pulmonary neg pulmonary ROS,    Pulmonary exam normal breath sounds clear to auscultation       Cardiovascular Exercise Tolerance: Good Pt. on medications negative cardio ROS Normal cardiovascular exam Rhythm:regular Rate:Normal     Neuro/Psych Anxiety negative neurological ROS  negative psych ROS   GI/Hepatic Neg liver ROS, GERD  Medicated,  Endo/Other  negative endocrine ROS  Renal/GU Renal Insufficiencynegative Renal ROS  negative genitourinary   Musculoskeletal negative musculoskeletal ROS (+)   Abdominal   Peds  Hematology negative hematology ROS (+)   Anesthesia Other Findings   Reproductive/Obstetrics negative OB ROS                             Anesthesia Physical  Anesthesia Plan  ASA: 2  Anesthesia Plan: General   Post-op Pain Management:    Induction:   PONV Risk Score and Plan: Propofol infusion  Airway Management Planned: Nasal Cannula and Natural Airway  Additional Equipment:   Intra-op Plan:   Post-operative Plan:   Informed Consent: I have reviewed the patients History and Physical, chart, labs and discussed the procedure including the risks, benefits and alternatives for the proposed anesthesia with the patient or authorized representative who has indicated his/her understanding and acceptance.     Dental Advisory Given  Plan Discussed with: CRNA  Anesthesia Plan Comments:        Anesthesia Quick Evaluation

## 2021-05-23 NOTE — Op Note (Signed)
[facility] Patient Name: Elijah Shaffer Procedure Date: 05/23/2021 8:02 AM MRN: [id number] Date of Birth: Dec 08, 1980 Attending MD: Maylon Peppers ,  CSN: [id number] Age: 40 Admit Type: Outpatient Procedure:                Colonoscopy Indications:              High risk colon cancer surveillance: Personal                            history of colonic polyps Providers:                Maylon Peppers, Caprice Kluver, Miner Risa Grill, Technician Referring MD:              Medicines:                Monitored Anesthesia Care Complications:            No immediate complications. Estimated Blood Loss:     Estimated blood loss: none. Procedure:                Pre-Anesthesia Assessment:                           - Prior to the procedure, a History and Physical                            was performed, and patient medications, allergies                            and sensitivities were reviewed. The patient's                            tolerance of previous anesthesia was reviewed.                           - The risks and benefits of the procedure and the                            sedation options and risks were discussed with the                            patient. All questions were answered and informed                            consent was obtained.                           - ASA Grade Assessment: II - A patient with mild                            systemic disease.                           After obtaining informed consent, the colonoscope  was passed under direct vision. Throughout the                            procedure, the patient's blood pressure, pulse, and                            oxygen saturations were monitored continuously. The                            [id number]-[id number] ([id number]) scope was introduced through                            the anus and advanced to the the cecum, identified                            by  appendiceal orifice and ileocecal valve. The                            colonoscopy was performed without difficulty. The                            patient tolerated the procedure well. The quality                            of the bowel preparation was good. Scope In: 8:06:11 AM Scope Out: 8:27:13 AM Scope Withdrawal Time: 0 hours 16 minutes 39 seconds  Total Procedure Duration: 0 hours 21 minutes 2 seconds  Findings:      The perianal and digital rectal examinations were normal.      Multiple large-mouthed diverticula were found in the sigmoid colon,       transverse colon, ascending colon and cecum.      Non-bleeding internal hemorrhoids were found during retroflexion. The       hemorrhoids were small. Impression:               - Diverticulosis in the sigmoid colon, in the                            transverse colon, in the ascending colon and in the                            cecum.                           - Non-bleeding internal hemorrhoids.                           - No specimens collected. Moderate Sedation:      Per Anesthesia Care Recommendation:           - Discharge patient to home (ambulatory).                           - Resume previous diet.                           - Repeat  colonoscopy in 5 years for screening                            purposes. Procedure Code(s):        --- Professional ---                           E1859, Colorectal cancer screening; colonoscopy on                            individual at high risk Diagnosis Code(s):        --- Professional ---                           Z86.010, Personal history of colonic polyps                           K64.8, Other hemorrhoids                           K57.30, Diverticulosis of large intestine without                            perforation or abscess without bleeding CPT copyright 2019 American Medical Association. All rights reserved. The codes documented in this report are preliminary and upon coder  review may  be revised to meet current compliance requirements. Maylon Peppers, MD Maylon Peppers,  05/23/2021 8:36:20 AM This report has been signed electronically. Number of Addenda: 0

## 2021-05-23 NOTE — Anesthesia Postprocedure Evaluation (Signed)
Anesthesia Post Note  Patient: NGOC DAUGHTRIDGE  Procedure(s) Performed: COLONOSCOPY WITH PROPOFOL ESOPHAGOGASTRODUODENOSCOPY (EGD) WITH PROPOFOL BIOPSY  Patient location during evaluation: Phase II Anesthesia Type: General Level of consciousness: awake Pain management: pain level controlled Vital Signs Assessment: post-procedure vital signs reviewed and stable Respiratory status: spontaneous breathing and respiratory function stable Cardiovascular status: blood pressure returned to baseline and stable Postop Assessment: no headache and no apparent nausea or vomiting Anesthetic complications: no Comments: Late entry   No notable events documented.   Last Vitals:  Vitals:   05/23/21 0650 05/23/21 0831  BP: (!) 137/92 104/64  Resp: 17 18  Temp: 36.8 C 36.8 C  SpO2: 97% 95%    Last Pain:  Vitals:   05/23/21 0831  TempSrc: Oral  PainSc: 0-No pain                 Louann Sjogren

## 2021-05-23 NOTE — Discharge Instructions (Addendum)
You are being discharged to home.  Resume your previous diet.  We are waiting for your pathology results.  Continue your present medications,including omeprazole 40 mg twice a day.  Follow up in GI clinic in 6 months.

## 2021-05-23 NOTE — Op Note (Signed)
[facility] Patient Name: Elijah Shaffer Procedure Date: 05/23/2021 7:05 AM MRN: [id number] Date of Birth: [birth year] Attending MD: Maylon Peppers ,  CSN: [id number] Age: 40 Admit Type: Outpatient Procedure:                Upper GI endoscopy Indications:              Barrett's esophagus, follow up esophageal plaque. Providers:                Maylon Peppers, Caprice Kluver, Kristine L. Risa Grill, Technician Referring MD:              Medicines:                Monitored Anesthesia Care Complications:            No immediate complications. Estimated Blood Loss:     Estimated blood loss: none. Procedure:                Pre-Anesthesia Assessment:                           - Prior to the procedure, a History and Physical                            was performed, and patient medications, allergies                            and sensitivities were reviewed. The patient's                            tolerance of previous anesthesia was reviewed.                           - The risks and benefits of the procedure and the                            sedation options and risks were discussed with the                            patient. All questions were answered and informed                            consent was obtained.                           - ASA Grade Assessment: II - A patient with mild                            systemic disease.                           After obtaining informed consent, the endoscope was                            passed under direct vision. Throughout the  procedure, the patient's blood pressure, pulse, and                            oxygen saturations were monitored continuously. The                            [id number]-H190 ([id number]) scope was introduced through the                            mouth, and advanced to the second part of duodenum.                            The upper GI endoscopy was accomplished  without                            difficulty. The patient tolerated the procedure                            well. Scope In: 7:39:01 AM Scope Out: 7:59:38 AM Total Procedure Duration: 0 hours 20 minutes 37 seconds  Findings:      The esophagus and gastroesophageal junction were examined with white       light and narrow band imaging (NBI). There were esophageal mucosal       changes consistent with long-segment Barrett's esophagus, classified as       Barrett's stage C5-M6 per Prague criteria. These changes involved the       mucosa at the upper extent of the gastric folds (36 cm from the       incisors) extending to the Z-line (30 cm from the incisors).       Circumferential salmon-colored mucosa was present from 30 to 36 cm. The       maximum longitudinal extent of these esophageal mucosal changes was 6 cm       in length. No nodularities were observed upon careful inspection with       white light and NBI. Mucosa was biopsied with a cold forceps for       histology in 4 quadrants at intervals of 1 cm. A total of 7 specimen       bottles were sent to pathology. The previous area of esophageal plaque       was no longer present, this was likely a healing ulcer from MWT.      A 9 cm hiatal hernia was present.      The entire examined stomach was normal.      The examined duodenum was normal. Impression:               - Esophageal mucosal changes consistent with                            long-segment Barrett's esophagus, classified as                            Barrett's stage C5-M6 per Prague criteria. Biopsied.                           - 9 cm hiatal hernia.                           -  Normal stomach.                           - Normal examined duodenum. Moderate Sedation:      Per Anesthesia Care Recommendation:           - Discharge patient to home (ambulatory).                           - Resume previous diet.                           - Await pathology results.                            - Continue present medications, including                            omeprazole 40 mg twice a day.                           - Follow up in GI clinic in 6 months. Procedure Code(s):        --- Professional ---                           [id number], Esophagogastroduodenoscopy, flexible,                            transoral; with biopsy, single or multiple Diagnosis Code(s):        --- Professional ---                           K22.70, Barrett's esophagus without dysplasia                           K44.9, Diaphragmatic hernia without obstruction or                            gangrene CPT copyright 2019 [employer]. All rights reserved. The codes documented in this report are preliminary and upon coder review may  be revised to meet current compliance requirements. Maylon Peppers, MD Maylon Peppers,  05/23/2021 8:32:14 AM This report has been signed electronically. Number of Addenda: 0

## 2021-05-24 LAB — SURGICAL PATHOLOGY

## 2021-05-29 ENCOUNTER — Encounter (HOSPITAL_COMMUNITY): Payer: Self-pay | Admitting: Gastroenterology

## 2021-06-03 ENCOUNTER — Emergency Department (HOSPITAL_COMMUNITY)
Admission: EM | Admit: 2021-06-03 | Discharge: 2021-06-03 | Disposition: A | Discharge status: Home / Self Care | Payer: 59 | Attending: Emergency Medicine | Admitting: Emergency Medicine

## 2021-06-03 ENCOUNTER — Emergency Department (HOSPITAL_COMMUNITY): Payer: 59

## 2021-06-03 ENCOUNTER — Encounter (HOSPITAL_COMMUNITY): Payer: Self-pay | Admitting: Emergency Medicine

## 2021-06-03 ENCOUNTER — Other Ambulatory Visit: Payer: Self-pay

## 2021-06-03 DIAGNOSIS — I10 Essential (primary) hypertension: Secondary | ICD-10-CM | POA: Diagnosis not present

## 2021-06-03 DIAGNOSIS — R000 Tachycardia, unspecified: Secondary | ICD-10-CM | POA: Insufficient documentation

## 2021-06-03 DIAGNOSIS — Z20822 Contact with and (suspected) exposure to covid-19: Secondary | ICD-10-CM | POA: Diagnosis not present

## 2021-06-03 DIAGNOSIS — R519 Headache, unspecified: Secondary | ICD-10-CM | POA: Insufficient documentation

## 2021-06-03 DIAGNOSIS — M791 Myalgia, unspecified site: Secondary | ICD-10-CM | POA: Insufficient documentation

## 2021-06-03 DIAGNOSIS — R509 Fever, unspecified: Secondary | ICD-10-CM | POA: Diagnosis not present

## 2021-06-03 DIAGNOSIS — R112 Nausea with vomiting, unspecified: Secondary | ICD-10-CM | POA: Diagnosis not present

## 2021-06-03 DIAGNOSIS — R0602 Shortness of breath: Secondary | ICD-10-CM

## 2021-06-03 DIAGNOSIS — R21 Rash and other nonspecific skin eruption: Secondary | ICD-10-CM | POA: Insufficient documentation

## 2021-06-03 DIAGNOSIS — R251 Tremor, unspecified: Secondary | ICD-10-CM | POA: Insufficient documentation

## 2021-06-03 DIAGNOSIS — D72829 Elevated white blood cell count, unspecified: Secondary | ICD-10-CM | POA: Insufficient documentation

## 2021-06-03 LAB — URINALYSIS, ROUTINE W REFLEX MICROSCOPIC
Bacteria, UA: NONE SEEN
Bilirubin Urine: NEGATIVE
Glucose, UA: NEGATIVE mg/dL
Hgb urine dipstick: NEGATIVE
Ketones, ur: NEGATIVE mg/dL
Nitrite: NEGATIVE
Protein, ur: NEGATIVE mg/dL
Specific Gravity, Urine: 1.024 (ref 1.005–1.030)
pH: 5 (ref 5.0–8.0)

## 2021-06-03 LAB — RESP PANEL BY RT-PCR (FLU A&B, COVID) ARPGX2
Influenza A by PCR: NEGATIVE
Influenza B by PCR: NEGATIVE
SARS Coronavirus 2 by RT PCR: NEGATIVE

## 2021-06-03 LAB — CBC WITH DIFFERENTIAL/PLATELET
Abs Immature Granulocytes: 0.09 10*3/uL — ABNORMAL HIGH (ref 0.00–0.07)
Basophils Absolute: 0.1 10*3/uL (ref 0.0–0.1)
Basophils Relative: 0 %
Eosinophils Absolute: 0 10*3/uL (ref 0.0–0.5)
Eosinophils Relative: 0 %
HCT: 48.9 % (ref 39.0–52.0)
Hemoglobin: 16.8 g/dL (ref 13.0–17.0)
Immature Granulocytes: 1 %
Lymphocytes Relative: 9 %
Lymphs Abs: 1.2 10*3/uL (ref 0.7–4.0)
MCH: 31.2 pg (ref 26.0–34.0)
MCHC: 34.4 g/dL (ref 30.0–36.0)
MCV: 90.9 fL (ref 80.0–100.0)
Monocytes Absolute: 0.8 10*3/uL (ref 0.1–1.0)
Monocytes Relative: 6 %
Neutro Abs: 10.8 10*3/uL — ABNORMAL HIGH (ref 1.7–7.7)
Neutrophils Relative %: 84 %
Platelets: 280 10*3/uL (ref 150–400)
RBC: 5.38 MIL/uL (ref 4.22–5.81)
RDW: 12.5 % (ref 11.5–15.5)
WBC: 13 10*3/uL — ABNORMAL HIGH (ref 4.0–10.5)
nRBC: 0 % (ref 0.0–0.2)

## 2021-06-03 LAB — COMPREHENSIVE METABOLIC PANEL
ALT: 31 U/L (ref 0–44)
AST: 25 U/L (ref 15–41)
Albumin: 4.1 g/dL (ref 3.5–5.0)
Alkaline Phosphatase: 63 U/L (ref 38–126)
Anion gap: 8 (ref 5–15)
BUN: 9 mg/dL (ref 6–20)
CO2: 26 mmol/L (ref 22–32)
Calcium: 9.5 mg/dL (ref 8.9–10.3)
Chloride: 102 mmol/L (ref 98–111)
Creatinine, Ser: 1.31 mg/dL — ABNORMAL HIGH (ref 0.61–1.24)
GFR, Estimated: >60 mL/min (ref >60)
Glucose, Bld: 106 mg/dL — ABNORMAL HIGH (ref 70–99)
Potassium: 4 mmol/L (ref 3.5–5.1)
Sodium: 136 mmol/L (ref 135–145)
Total Bilirubin: 0.4 mg/dL (ref 0.3–1.2)
Total Protein: 7.3 g/dL (ref 6.5–8.1)

## 2021-06-03 LAB — LACTIC ACID, PLASMA: Lactic Acid, Venous: 1.9 mmol/L (ref 0.5–1.9)

## 2021-06-03 MED ORDER — ONDANSETRON 4 MG PO TBDP
4.0000 mg | ORAL_TABLET | Freq: Once | ORAL | Status: AC
Start: 2021-06-03 — End: 2021-06-03
  Administered 2021-06-03: 4 mg via ORAL
  Filled 2021-06-03: qty 1

## 2021-06-03 MED ORDER — ACETAMINOPHEN 500 MG PO TABS
1000.0000 mg | ORAL_TABLET | Freq: Once | ORAL | Status: AC
  Administered 2021-06-03: 1000 mg via ORAL
  Filled 2021-06-03: qty 2

## 2021-06-03 NOTE — ED Provider Notes (Signed)
[facility] EMERGENCY DEPARTMENT Provider Note   CSN: [id number] Arrival date & time: 06/03/21  1729     History No chief complaint on file.   POE RINIKER is a 40 y.o. male.  Patient presents to the ER chief complaint of intermittent chills, and shaking hands bilaterally.  Symptoms are started about 3 days ago.  Then he started develop a headache today.  Also complaining of generalized body aches, nausea and vomiting today.  He did not know he had a fever until he came into the ER.  Denies any neck pain or back pain or abdominal pain.  No reports of sore throat or neck pain or runny nose or cough.      Past Medical History:  Diagnosis Date   Acute renal injury ([place]) 06/28/2017   Anxiety    Diverticulitis    GERD (gastroesophageal reflux disease) 01/08/2018   Seasonal allergies     Patient Active Problem List   Diagnosis Date Noted   Barrett's esophagus 04/11/2021   History of colonic polyps 04/11/2021   Esophageal lesion 04/11/2021   Aspiration into airway    Aspiration pneumonia of right upper lobe due to gastric secretions ([place])    Coffee ground emesis 03/17/2021   Essential hypertension 02/23/2021   Focal neurological deficit 02/23/2021   GERD (gastroesophageal reflux disease) 01/08/2018   Acute renal injury ([place]) 06/28/2017    Past Surgical History:  Procedure Laterality Date   BIOPSY  03/18/2021   Procedure: BIOPSY;  Surgeon: Harvel Quale, MD;  Location: [facility];  Service: Gastroenterology;;  esophageal at Z-line   BIOPSY  05/23/2021   Procedure: BIOPSY;  Surgeon: Harvel Quale, MD;  Location: [facility];  Service: Gastroenterology;;   COLONOSCOPY N/A 09/22/2017   Procedure: COLONOSCOPY;  Surgeon: Rogene Houston, MD;  Location: [facility];  Service: Endoscopy;  Laterality: N/A;  9:55   COLONOSCOPY WITH PROPOFOL N/A 05/23/2021   Procedure: COLONOSCOPY WITH PROPOFOL;  Surgeon: Harvel Quale, MD;   Location: [facility];  Service: Gastroenterology;  Laterality: N/A;  7:30   ESOPHAGOGASTRODUODENOSCOPY (EGD) WITH PROPOFOL N/A 03/18/2021   Procedure: ESOPHAGOGASTRODUODENOSCOPY (EGD) WITH PROPOFOL;  Surgeon: Harvel Quale, MD;  Location: [facility];  Service: Gastroenterology;  Laterality: N/A;   ESOPHAGOGASTRODUODENOSCOPY (EGD) WITH PROPOFOL N/A 05/23/2021   Procedure: ESOPHAGOGASTRODUODENOSCOPY (EGD) WITH PROPOFOL;  Surgeon: Harvel Quale, MD;  Location: [facility];  Service: Gastroenterology;  Laterality: N/A;   POLYPECTOMY  09/22/2017   Procedure: POLYPECTOMY;  Surgeon: Rogene Houston, MD;  Location: [facility];  Service: Endoscopy;;       Family History  Problem Relation Age of Onset   Colon cancer Maternal Grandmother    Colon cancer Maternal Grandfather    Healthy Mother    Healthy Father     Social History   Tobacco Use   Smoking status: Never   Smokeless tobacco: Never  Vaping Use   Vaping Use: Never used  Substance Use Topics   Alcohol use: Yes    Comment: occasional   Drug use: No    Home Medications Prior to Admission medications   Medication Sig Start Date End Date Taking? Authorizing Provider  acetaminophen (TYLENOL) 500 MG tablet Take 500 mg by mouth every 6 (six) hours as needed.    [provider]  albuterol (VENTOLIN HFA) 108 (90 Base) MCG/ACT inhaler Inhale 2 puffs into the lungs every 6 (six) hours as needed for shortness of breath.  05/08/21   [provider]  b complex vitamins capsule Take 1 capsule by mouth daily. With D3 gummie    [provider]  cetirizine (ZYRTEC) 10 MG tablet Take 10 mg by mouth daily.    [provider]  cyanocobalamin 1000 MCG tablet Take 1,000 mcg by mouth daily. Patch    [provider]  docusate sodium (COLACE) 100 MG capsule Take 100 mg by mouth daily.    [provider]  fluticasone (FLONASE ALLERGY RELIEF) 50 MCG/ACT nasal spray Place  2 sprays into both nostrils daily. Patient taking differently: Place 2 sprays into both nostrils daily as needed for allergies or rhinitis. 03/05/21   Scot Jun, FNP  guaiFENesin (MUCINEX) 600 MG 12 hr tablet Take 600 mg by mouth 2 (two) times daily as needed for to loosen phlegm.    [provider]  hydrOXYzine (ATARAX/VISTARIL) 10 MG tablet Take 1 tablet (10 mg total) by mouth 3 (three) times daily as needed for anxiety. 02/25/21   Cristal Deer, MD  Multiple Vitamins-Minerals (MULTIVITAMIN WITH MINERALS) tablet Take 1 tablet by mouth daily. Patch    [provider]  omega-3 acid ethyl esters (LOVAZA) 1 g capsule Take 1 g by mouth daily.    [provider]  ondansetron (ZOFRAN) 4 MG tablet Take 1 tablet (4 mg total) by mouth every 6 (six) hours as needed for nausea. 03/19/21   Sheikh, Omair Latif, DO  pantoprazole (PROTONIX) 40 MG tablet Take 1 tablet (40 mg total) by mouth 2 (two) times daily. 05/23/21 08/21/21  Harvel Quale, MD  promethazine-dextromethorphan (PROMETHAZINE-DM) 6.25-15 MG/5ML syrup Take 5 mLs by mouth 3 (three) times daily as needed for cough. Patient not taking: Reported on 05/16/2021 03/05/21   Scot Jun, FNP  triamcinolone ointment (KENALOG) 0.5 % Apply 1 application topically daily as needed (itching). 12/06/19   [provider]    Allergies    Other and Claritin [loratadine]  Review of Systems   Review of Systems  Constitutional:  Negative for fever.  HENT:  Negative for ear pain and sore throat.   Eyes:  Negative for pain.  Respiratory:  Negative for cough.   Cardiovascular:  Negative for chest pain.  Gastrointestinal:  Negative for abdominal pain.  Genitourinary:  Negative for flank pain.  Musculoskeletal:  Negative for back pain.  Skin:  Negative for color change and rash.  Neurological:  Negative for syncope.  All other systems reviewed and are negative.  Physical Exam Updated Vital Signs BP (!)  152/99 (BP Location: Right Arm)   Pulse 100   Temp (!) 101 F (38.3 C) (Oral)   Resp 18   SpO2 98%   Physical Exam Constitutional:      Appearance: He is well-developed.  HENT:     Head: Normocephalic.     Nose: Nose normal.  Eyes:     Extraocular Movements: Extraocular movements intact.  Cardiovascular:     Rate and Rhythm: Normal rate.  Pulmonary:     Effort: Pulmonary effort is normal.  Skin:    Coloration: Skin is not jaundiced.     Comments: Pink blanching maculopapular rash seen on the anterior torso.  No rash seen on extremities or palms or soles of feet.  Neurological:     Mental Status: He is alert. Mental status is at baseline.    ED Results / Procedures / Treatments   Labs (all labs ordered are listed, but only abnormal results are displayed) Labs Reviewed  COMPREHENSIVE METABOLIC PANEL - Abnormal; Notable for the following components:      Result Value   Glucose, Bld 106 (*)    Creatinine, Ser 1.31 (*)    All other components within normal limits  CBC WITH DIFFERENTIAL/PLATELET - Abnormal; Notable for the following components:   WBC 13.0 (*)    Neutro Abs 10.8 (*)    Abs Immature Granulocytes 0.09 (*)    All other components within normal limits  URINALYSIS, ROUTINE W REFLEX MICROSCOPIC - Abnormal; Notable for the following components:   APPearance HAZY (*)    Leukocytes,Ua SMALL (*)    All other components within normal limits  RESP PANEL BY RT-PCR (FLU A&B, COVID) [id number]  LACTIC ACID, PLASMA  LACTIC ACID, PLASMA    EKG EKG Interpretation  Date/Time:  Sunday June 03 2021 17:38:22 EDT Ventricular Rate:  123 PR Interval:  160 QRS Duration: 90 QT Interval:  294 QTC Calculation: 420 R Axis:   54 Text Interpretation: Sinus tachycardia Otherwise normal ECG Confirmed by Thamas Jaegers ([id number]) on 06/03/2021 7:25:15 PM  Radiology DG Chest 1 View  Result Date: 06/03/2021 CLINICAL DATA:  Lightheadedness, nausea and vomiting, shortness of breath, and fever  for several days. EXAM: CHEST  1 VIEW COMPARISON:  04/23/2021 FINDINGS: Heart size and pulmonary vascularity are normal. Lungs are clear. Azygos lobe. No pleural effusions. No pneumothorax. Mediastinal contours appear intact. Moderate esophageal hiatal hernia behind the heart. IMPRESSION: No active disease. Electronically Signed   By: Lucienne Capers M.D.   On: 06/03/2021 18:50   CT Head Wo Contrast  Result Date: 06/03/2021 CLINICAL DATA:  Migraine headache. EXAM: CT HEAD WITHOUT CONTRAST TECHNIQUE: Contiguous axial images were obtained from the base of the skull through the vertex without intravenous contrast. COMPARISON:  Mar 17, 2021. FINDINGS: Brain: No evidence of acute infarction, hemorrhage, hydrocephalus, extra-axial collection or mass lesion/mass effect. Vascular: No hyperdense vessel or unexpected calcification. Skull: Normal. Negative for fracture or focal lesion. Sinuses/Orbits: No acute finding. Other: None. IMPRESSION: No acute intracranial abnormality seen. Electronically Signed   By: Marijo Conception M.D.   On: 06/03/2021 20:20    Procedures Procedures   Medications Ordered in ED Medications  ondansetron (ZOFRAN-ODT) disintegrating tablet 4 mg (4 mg Oral Given 06/03/21 1756)  acetaminophen (TYLENOL) tablet 1,000 mg (1,000 mg Oral Given 06/03/21 2021)    ED Course  I have reviewed the triage vital signs and the nursing notes.  Pertinent labs & imaging results that were available during my care of the patient were reviewed by me and considered in my medical decision making (see chart for details).    MDM Rules/Calculators/A&P                           Patient presents with fevers and chills and body aches and a rash.  I doubt meningitis given he has no meningismal signs no neck pain.  Work-up is otherwise shows mild leukocytosis, chemistry normal.  COVID test is negative lactic acid is normal and urinalysis is unremarkable.  Chest x-ray is clear as well.  Given negative  work-up and rash I suspect viral pattern.  Will recommend close follow-up with his doctor within the week.  Recommending immediate return if he is having worsening symptoms worsening pain or any additional concerns.   Final Clinical Impression(s) / ED Diagnoses Final diagnoses:  Febrile illness    Rx / DC Orders ED Discharge Orders     None  Luna Fuse, MD 06/03/21 2123

## 2021-06-03 NOTE — Discharge Instructions (Addendum)
Call your primary care doctor or specialist as discussed in the next 2-3 days.   Return immediately back to the ER if:  Your symptoms worsen within the next 12-24 hours. You develop new symptoms such as new fevers, persistent vomiting, new pain, shortness of breath, or new weakness or numbness, or if you have any other concerns.  

## 2021-06-03 NOTE — ED Notes (Signed)
Discharge instructions discussed with pt. Pt verbalized understanding. Pt stable and ambulatory. No signature pad available. 

## 2021-06-03 NOTE — ED Provider Notes (Signed)
Emergency Medicine Provider Triage Evaluation Note  Elijah Shaffer , a 40 y.o. male  was evaluated in triage.  Pt complains of onset of headache, body aches, nausea and vomiting with shortness of breath at 3:00 today.  Patient took 2 Tylenol and came to the ER.  Fever of 101.9, unable to take NSAIDs due to diverticulitis history.  Review of Systems  Positive: Fever, headache, body aches, SHOB, vomiting Negative: Cough, lower extremity edema  Physical Exam  BP (!) 149/101   Pulse (!) 110   Temp (!) 101.9 F (38.8 C) (Oral)   Resp 16   SpO2 100%  Gen:   Awake, no distress   Resp:  Normal effort  MSK:   Moves extremities without difficulty  Other:    Medical Decision Making  Medically screening exam initiated at 5:38 PM.  Appropriate orders placed.  BANYAN TRACHT was informed that the remainder of the evaluation will be completed by another provider, this initial triage assessment does not replace that evaluation, and the importance of remaining in the ED until their evaluation is complete.     Tacy Learn, PA-C 06/03/21 1745    Luna Fuse, MD 06/03/21 2120

## 2021-06-03 NOTE — ED Triage Notes (Signed)
C/o headache, bilateral eye pain, SOB, nausea, vomiting, generalized weakness, fever, and bilateral hand tingling since 3:30pm.  Took 2 Tylenol at 3:30pm.

## 2021-06-04 ENCOUNTER — Ambulatory Visit
Admission: EM | Admit: 2021-06-04 | Discharge: 2021-06-04 | Disposition: A | Discharge status: Home / Self Care | Payer: 59 | Attending: Family Medicine | Admitting: Family Medicine

## 2021-06-04 DIAGNOSIS — R0981 Nasal congestion: Secondary | ICD-10-CM

## 2021-06-04 DIAGNOSIS — J029 Acute pharyngitis, unspecified: Secondary | ICD-10-CM

## 2021-06-04 DIAGNOSIS — R448 Other symptoms and signs involving general sensations and perceptions: Secondary | ICD-10-CM

## 2021-06-04 DIAGNOSIS — Z20822 Contact with and (suspected) exposure to covid-19: Secondary | ICD-10-CM

## 2021-06-04 MED ORDER — ACETAMINOPHEN 325 MG PO TABS
650.0000 mg | ORAL_TABLET | Freq: Once | ORAL | Status: AC
  Administered 2021-06-04: 650 mg via ORAL

## 2021-06-04 MED ORDER — AMOXICILLIN 875 MG PO TABS: 875.0000 mg | ORAL_TABLET | Freq: Two times a day (BID) | ORAL | 0 refills | Status: DC

## 2021-06-04 MED ORDER — ONDANSETRON HCL 4 MG PO TABS: 4.0000 mg | ORAL_TABLET | Freq: Four times a day (QID) | ORAL | 0 refills | Status: DC | PRN

## 2021-06-04 NOTE — ED Provider Notes (Signed)
[facility]    CSN: [id number] Arrival date & time: 06/04/21  1050      History   Chief Complaint Chief Complaint  Patient presents with   Headache   Leg Pain    HPI Elijah Shaffer is a 40 y.o. male.   HPI Patient presents today with fever, headache and generalized leg pain, continued shortness of breath, chills following an ER visit in which she presented with similar course of symptoms x1 day ago.  Patient reports today his throat is more sore than it was yesterday.  While at the ER he had a complete respiratory panel performed which was negative.  He had a complete work-up with blood work which revealed only a slight increase in white count and a slight bump in his creatinine.  Patient endorses has not been drinking very much due to generally not feeling well and having nausea.  He has been managing fever with Tylenol.  Recently has had multiple problems with his abdomen including a upper GI bleed.  He he denies hemoptysis or any dark tarry stool.  He has had a bowel movement today but denies that it was diarrheal.  He has also had a generalized headache however that has been ongoing prior to the symptoms that he has had over the last 24 hours. Past Medical History:  Diagnosis Date   Acute renal injury ([place]) 06/28/2017   Anxiety    Diverticulitis    GERD (gastroesophageal reflux disease) 01/08/2018   Seasonal allergies     Patient Active Problem List   Diagnosis Date Noted   Barrett's esophagus 04/11/2021   History of colonic polyps 04/11/2021   Esophageal lesion 04/11/2021   Aspiration into airway    Aspiration pneumonia of right upper lobe due to gastric secretions ([place])    Coffee ground emesis 03/17/2021   Essential hypertension 02/23/2021   Focal neurological deficit 02/23/2021   GERD (gastroesophageal reflux disease) 01/08/2018   Acute renal injury ([place]) 06/28/2017    Past Surgical History:  Procedure Laterality Date   BIOPSY  03/18/2021   Procedure:  BIOPSY;  Surgeon: Harvel Quale, MD;  [facility];  Service: Gastroenterology;;  esophageal at [facility]   BIOPSY  05/23/2021   Procedure: BIOPSY;  Surgeon: Harvel Quale, MD;  [facility];  Service: Gastroenterology;;   COLONOSCOPY N/A 09/22/2017   Procedure: COLONOSCOPY;  Surgeon: Rogene Houston, MD;  [facility];  Service: Endoscopy;  Laterality: N/A;  9:55   COLONOSCOPY WITH PROPOFOL N/A 05/23/2021   Procedure: COLONOSCOPY WITH PROPOFOL;  Surgeon: Harvel Quale, MD;  [facility];  Service: Gastroenterology;  Laterality: N/A;  7:30   ESOPHAGOGASTRODUODENOSCOPY (EGD) WITH PROPOFOL N/A 03/18/2021   Procedure: ESOPHAGOGASTRODUODENOSCOPY (EGD) WITH PROPOFOL;  Surgeon: Harvel Quale, MD;  [facility];  Service: Gastroenterology;  Laterality: N/A;   ESOPHAGOGASTRODUODENOSCOPY (EGD) WITH PROPOFOL N/A 05/23/2021   Procedure: ESOPHAGOGASTRODUODENOSCOPY (EGD) WITH PROPOFOL;  Surgeon: Harvel Quale, MD;  [facility];  Service: Gastroenterology;  Laterality: N/A;   POLYPECTOMY  09/22/2017   Procedure: POLYPECTOMY;  Surgeon: Rogene Houston, MD;  [facility];  Service: Endoscopy;;       Home Medications    Prior to Admission medications   Medication Sig Start Date End Date Taking? Authorizing Provider  amoxicillin (AMOXIL) 875 MG tablet Take 1 tablet (875 mg total) by mouth 2 (two) times daily. 06/04/21  Yes Scot Jun, FNP  acetaminophen (TYLENOL) 500 MG tablet Take 500 mg by mouth every 6 (six) hours as needed.    [provider]  albuterol (VENTOLIN HFA) 108 (90 Base) MCG/ACT inhaler Inhale 2 puffs into the lungs every 6 (six) hours as needed for shortness of breath. 05/08/21   [provider]  b complex vitamins capsule Take 1 capsule by mouth daily. With D3 gummie    [provider]  cetirizine (ZYRTEC) 10 MG tablet Take 10  mg by mouth daily.    [provider]  cyanocobalamin 1000 MCG tablet Take 1,000 mcg by mouth daily. Patch    [provider]  docusate sodium (COLACE) 100 MG capsule Take 100 mg by mouth daily.    [provider]  fluticasone (FLONASE ALLERGY RELIEF) 50 MCG/ACT nasal spray Place 2 sprays into both nostrils daily. Patient taking differently: Place 2 sprays into both nostrils daily as needed for allergies or rhinitis. 03/05/21   Scot Jun, FNP  guaiFENesin (MUCINEX) 600 MG 12 hr tablet Take 600 mg by mouth 2 (two) times daily as needed for to loosen phlegm.    [provider]  hydrOXYzine (ATARAX/VISTARIL) 10 MG tablet Take 1 tablet (10 mg total) by mouth 3 (three) times daily as needed for anxiety. 02/25/21   Cristal Deer, MD  Multiple Vitamins-Minerals (MULTIVITAMIN WITH MINERALS) tablet Take 1 tablet by mouth daily. Patch    [provider]  omega-3 acid ethyl esters (LOVAZA) 1 g capsule Take 1 g by mouth daily.    [provider]  ondansetron (ZOFRAN) 4 MG tablet Take 1 tablet (4 mg total) by mouth every 6 (six) hours as needed for nausea. 06/04/21   Scot Jun, FNP  pantoprazole (PROTONIX) 40 MG tablet Take 1 tablet (40 mg total) by mouth 2 (two) times daily. 05/23/21 08/21/21  Harvel Quale, MD  promethazine-dextromethorphan (PROMETHAZINE-DM) 6.25-15 MG/5ML syrup Take 5 mLs by mouth 3 (three) times daily as needed for cough. Patient not taking: Reported on 05/16/2021 03/05/21   Scot Jun, FNP  triamcinolone ointment (KENALOG) 0.5 % Apply 1 application topically daily as needed (itching). 12/06/19   [provider]    Family History Family History  Problem Relation Age of Onset   Colon cancer Maternal Grandmother    Colon cancer Maternal Grandfather    Healthy Mother    Healthy Father     Social History Social History   Tobacco Use   Smoking status: Never   Smokeless tobacco: Never   Vaping Use   Vaping Use: Never used  Substance Use Topics   Alcohol use: Yes    Comment: occasional   Drug use: No     Allergies   Other and Claritin [loratadine]   Review of Systems Review of Systems Pertinent negatives listed in HPI   Physical Exam Triage Vital Signs ED Triage Vitals  Enc Vitals Group     BP 06/04/21 1111 112/79     Pulse Rate 06/04/21 1111 (!) 101     Resp 06/04/21 1111 (!) 22     Temp 06/04/21 1111 99.4 F (37.4 C)     Temp Source 06/04/21 1316 Oral     SpO2 06/04/21 1111 96 %     Weight --      Height --      Head Circumference --      Peak Flow --      Pain Score --      Pain Loc --  Pain Edu? --      Excl. in [place]? --    No data found.  Updated Vital Signs BP 112/79   Pulse (!) 101   Temp (!) 101.5 F (38.6 C) (Oral)   Resp (!) 22   SpO2 96%   Visual Acuity Right Eye Distance:   Left Eye Distance:   Bilateral Distance:    Right Eye Near:   Left Eye Near:    Bilateral Near:     Physical Exam General Appearance:    Alert, acutely ill-appearing, cooperative, no distress  HENT:  Normocephalic,  bilateral TM normal without fluid or infection, neck has both sides anterior cervical nodes enlarged, pharynx with erythema, and tonsils red, enlarged, with exudate present  Eyes:    PERRL, conjunctiva/corneas clear, EOM's intact       Lungs:     Clear to auscultation bilaterally, respirations unlabored  Heart:    Regular rate and rhythm  Neurologic:   Awake, alert, oriented x 3. No apparent focal neurological           defect.         UC Treatments / Results  Labs (all labs ordered are listed, but only abnormal results are displayed) Labs Reviewed  COVID-19, FLU A+B NAA    EKG   Radiology DG Chest 1 View  Result Date: 06/03/2021 CLINICAL DATA:  Lightheadedness, nausea and vomiting, shortness of breath, and fever for several days. EXAM: CHEST  1 VIEW COMPARISON:  04/23/2021 FINDINGS: Heart size and pulmonary vascularity  are normal. Lungs are clear. Azygos lobe. No pleural effusions. No pneumothorax. Mediastinal contours appear intact. Moderate esophageal hiatal hernia behind the heart. IMPRESSION: No active disease. Electronically Signed   By: Lucienne Capers M.D.   On: 06/03/2021 18:50   CT Head Wo Contrast  Result Date: 06/03/2021 CLINICAL DATA:  Migraine headache. EXAM: CT HEAD WITHOUT CONTRAST TECHNIQUE: Contiguous axial images were obtained from the base of the skull through the vertex without intravenous contrast. COMPARISON:  Mar 17, 2021. FINDINGS: Brain: No evidence of acute infarction, hemorrhage, hydrocephalus, extra-axial collection or mass lesion/mass effect. Vascular: No hyperdense vessel or unexpected calcification. Skull: Normal. Negative for fracture or focal lesion. Sinuses/Orbits: No acute finding. Other: None. IMPRESSION: No acute intracranial abnormality seen. Electronically Signed   By: Marijo Conception M.D.   On: 06/03/2021 20:20    Procedures Procedures (including critical care time)  Medications Ordered in UC Medications  acetaminophen (TYLENOL) tablet 650 mg (650 mg Oral Given 06/04/21 1316)    Initial Impression / Assessment and Plan / UC Course  I have reviewed the triage vital signs and the nursing notes.  Pertinent labs & imaging results that were available during my care of the patient were reviewed by me and considered in my medical decision making (see chart for details).    Covering patient for acute pharyngitis given the overall appearance of his throat and current fever and nausea.  Patient also has some sinus congestion.  Advised to continue over-the-counter antihistamine therapy.  Patient has had a complete lab work-up at the ER however given unexplained fever I advised that if symptoms do not readily improve to go back to the ER.  We will repeat a COVID flu test as when his test was run yesterday his symptoms had only been present for 1 day.  Patient along with his mother  verbalized understanding and agreement with today's plan Final Clinical Impressions(s) / UC Diagnoses   Final diagnoses:  Acute pharyngitis,  unspecified etiology  Encounter for laboratory testing for COVID-19 virus  Facial pressure  Sinus congestion   Discharge Instructions   None    ED Prescriptions     Medication Sig Dispense Auth. Provider   amoxicillin (AMOXIL) 875 MG tablet Take 1 tablet (875 mg total) by mouth 2 (two) times daily. 20 tablet Scot Jun, FNP   ondansetron (ZOFRAN) 4 MG tablet Take 1 tablet (4 mg total) by mouth every 6 (six) hours as needed for nausea. 20 tablet Scot Jun, FNP      PDMP not reviewed this encounter.   Scot Jun, FNP 06/05/21 [phone]

## 2021-06-05 LAB — COVID-19, FLU A+B NAA
Influenza A, NAA: NOT DETECTED
Influenza B, NAA: NOT DETECTED
SARS-CoV-2, NAA: NOT DETECTED

## 2021-06-12 ENCOUNTER — Telehealth (INDEPENDENT_AMBULATORY_CARE_PROVIDER_SITE_OTHER): Payer: Self-pay

## 2021-06-12 NOTE — Telephone Encounter (Signed)
Patient called today stating something has to be done about all this excess gas and belching he is having. He state he is taking Pantoprazole 40 mg twice per day. Please advise.

## 2021-06-13 NOTE — Telephone Encounter (Signed)
Spoke to the patient and his mother regarding his symptoms, reports having recurrent belching and burping.  He has a large hiatal hernia which is likely contributing to these episodes.  He should continue taking his PPI twice a day and can benefit from Gas-X as needed, ultimately if his symptoms are refractory and he does not have any improvement with the medication, we can refer him for hernia repair.

## 2021-06-15 ENCOUNTER — Ambulatory Visit
Admission: EM | Admit: 2021-06-15 | Discharge: 2021-06-15 | Disposition: A | Discharge status: Home / Self Care | Payer: 59

## 2021-06-15 ENCOUNTER — Other Ambulatory Visit: Payer: Self-pay

## 2021-06-15 ENCOUNTER — Encounter: Payer: Self-pay | Admitting: Emergency Medicine

## 2021-06-15 NOTE — ED Notes (Signed)
Patient is being discharged from the Urgent Care and sent to the Emergency Department via pov . Per [place], patient is in need of higher level of care due to chronic fatigue . Patient is aware and verbalizes understanding of plan of care.  Vitals:   06/15/21 0943  BP: 123/85  Pulse: 93  Resp: 18  Temp: 98.8 F (37.1 C)  SpO2: 96%

## 2021-06-15 NOTE — ED Triage Notes (Signed)
Pt feels weak , running fevers on and off and on and off headaches.  Seen at urgent care approx 1 week ago for same s/s, has completed medications given and does not feel any better.

## 2021-06-19 ENCOUNTER — Other Ambulatory Visit: Payer: Self-pay | Admitting: Neurology

## 2021-06-19 ENCOUNTER — Other Ambulatory Visit (HOSPITAL_COMMUNITY): Payer: Self-pay | Admitting: Neurology

## 2021-06-19 DIAGNOSIS — R519 Headache, unspecified: Secondary | ICD-10-CM

## 2021-06-22 ENCOUNTER — Encounter: Payer: Self-pay | Admitting: Emergency Medicine

## 2021-06-22 ENCOUNTER — Other Ambulatory Visit: Payer: Self-pay

## 2021-06-22 ENCOUNTER — Ambulatory Visit
Admission: EM | Admit: 2021-06-22 | Discharge: 2021-06-22 | Disposition: A | Discharge status: Home / Self Care | Payer: 59 | Attending: Emergency Medicine | Admitting: Emergency Medicine

## 2021-06-22 DIAGNOSIS — Z9189 Other specified personal risk factors, not elsewhere classified: Secondary | ICD-10-CM

## 2021-06-22 DIAGNOSIS — R899 Unspecified abnormal finding in specimens from other organs, systems and tissues: Secondary | ICD-10-CM

## 2021-06-22 MED ORDER — DOXYCYCLINE HYCLATE 100 MG PO CAPS: 100.0000 mg | ORAL_CAPSULE | Freq: Two times a day (BID) | ORAL | 0 refills | Status: AC

## 2021-06-22 NOTE — ED Provider Notes (Signed)
[facility]   [id number] 06/22/21 Arrival Time: [id number]   CC: Abnormal lab  SUBJECTIVE:  Elijah Shaffer is a 40 y.o. male who presents here for treatment of abnormal lab.  Was seen in the ED on 8/5.  Blood work showed elevated titers of Ehrlichia IgG IFA .  Patient states he works outside and has multiple tick bites.  Reports headache and fatigued.  Denies nausea, vomiting, abdominal pain, chest pain, joint pain, muscle aches.    ROS: As per HPI.  All other pertinent ROS negative.     Past Medical History:  Diagnosis Date   Acute renal injury ([place]) 06/28/2017   Anxiety    Diverticulitis    GERD (gastroesophageal reflux disease) 01/08/2018   Seasonal allergies    Past Surgical History:  Procedure Laterality Date   BIOPSY  03/18/2021   Procedure: BIOPSY;  Surgeon: Harvel Quale, MD;  [facility];  Service: Gastroenterology;;  esophageal at [facility]   BIOPSY  05/23/2021   Procedure: BIOPSY;  Surgeon: Harvel Quale, MD;  [facility];  Service: Gastroenterology;;   COLONOSCOPY N/A 09/22/2017   Procedure: COLONOSCOPY;  Surgeon: Rogene Houston, MD;  [facility];  Service: Endoscopy;  Laterality: N/A;  9:55   COLONOSCOPY WITH PROPOFOL N/A 05/23/2021   Procedure: COLONOSCOPY WITH PROPOFOL;  Surgeon: Harvel Quale, MD;  [facility];  Service: Gastroenterology;  Laterality: N/A;  7:30   ESOPHAGOGASTRODUODENOSCOPY (EGD) WITH PROPOFOL N/A 03/18/2021   Procedure: ESOPHAGOGASTRODUODENOSCOPY (EGD) WITH PROPOFOL;  Surgeon: Harvel Quale, MD;  [facility];  Service: Gastroenterology;  Laterality: N/A;   ESOPHAGOGASTRODUODENOSCOPY (EGD) WITH PROPOFOL N/A 05/23/2021   Procedure: ESOPHAGOGASTRODUODENOSCOPY (EGD) WITH PROPOFOL;  Surgeon: Harvel Quale, MD;  [facility];  Service: Gastroenterology;  Laterality: N/A;   POLYPECTOMY  09/22/2017   Procedure: POLYPECTOMY;   Surgeon: Rogene Houston, MD;  [facility];  Service: Endoscopy;;   Allergies  Allergen Reactions   Other Other (See Comments)    Seeds/diverticulitis   Claritin [Loratadine] Other (See Comments)    Heart race   No current facility-administered medications on file prior to encounter.   Current Outpatient Medications on File Prior to Encounter  Medication Sig Dispense Refill   acetaminophen (TYLENOL) 500 MG tablet Take 500 mg by mouth every 6 (six) hours as needed.     albuterol (VENTOLIN HFA) 108 (90 Base) MCG/ACT inhaler Inhale 2 puffs into the lungs every 6 (six) hours as needed for shortness of breath.     b complex vitamins capsule Take 1 capsule by mouth daily. With D3 gummie     cetirizine (ZYRTEC) 10 MG tablet Take 10 mg by mouth daily.     cyanocobalamin 1000 MCG tablet Take 1,000 mcg by mouth daily. Patch     docusate sodium (COLACE) 100 MG capsule Take 100 mg by mouth daily.     fluticasone (FLONASE ALLERGY RELIEF) 50 MCG/ACT nasal spray Place 2 sprays into both nostrils daily. (Patient taking differently: Place 2 sprays into both nostrils daily as needed for allergies or rhinitis.) 16 g 2   guaiFENesin (MUCINEX) 600 MG 12 hr tablet Take 600 mg by mouth 2 (two) times daily as needed for to loosen phlegm.     hydrOXYzine (ATARAX/VISTARIL) 10 MG tablet Take 1 tablet (10 mg total) by mouth 3 (three) times daily as needed for anxiety. 30 tablet 0   Multiple Vitamins-Minerals (MULTIVITAMIN WITH MINERALS) tablet Take  1 tablet by mouth daily. Patch     omega-3 acid ethyl esters (LOVAZA) 1 g capsule Take 1 g by mouth daily.     ondansetron (ZOFRAN) 4 MG tablet Take 1 tablet (4 mg total) by mouth every 6 (six) hours as needed for nausea. 20 tablet 0   pantoprazole (PROTONIX) 40 MG tablet Take 1 tablet (40 mg total) by mouth 2 (two) times daily. 180 tablet 3   promethazine-dextromethorphan (PROMETHAZINE-DM) 6.25-15 MG/5ML syrup Take 5 mLs by mouth 3 (three) times daily as  needed for cough. (Patient not taking: Reported on 05/16/2021) 118 mL 0   triamcinolone ointment (KENALOG) 0.5 % Apply 1 application topically daily as needed (itching).     Social History   Socioeconomic History   Marital status: Single    Spouse name: Not on file   Number of children: Not on file   Years of education: Not on file   Highest education level: Not on file  Occupational History   Not on file  Tobacco Use   Smoking status: Never   Smokeless tobacco: Never  Vaping Use   Vaping Use: Never used  Substance and Sexual Activity   Alcohol use: Yes    Comment: occasional   Drug use: No   Sexual activity: Not on file  Other Topics Concern   Not on file  Social History Narrative   Not on file   Social Determinants of Health   Financial Resource Strain: Not on file  Food Insecurity: Not on file  Transportation Needs: Not on file  Physical Activity: Not on file  Stress: Not on file  Social Connections: Not on file  Intimate Partner Violence: Not on file   Family History  Problem Relation Age of Onset   Colon cancer Maternal Grandmother    Colon cancer Maternal Grandfather    Healthy Mother    Healthy Father     OBJECTIVE:  Vitals:   06/22/21 1452  BP: (!) 129/94  Pulse: (!) 119  Resp: 18  Temp: 99.1 F (37.3 C)  TempSrc: Oral  SpO2: 96%    General appearance: alert; fatigued appearing, nontoxic; speaking in full sentences and tolerating own secretions HEENT: NCAT; Ears: EACs clear Eyes: PERRL.  EOM grossly intact  Neck: supple without LAD Lungs: unlabored respirations, symmetrical air entry; cough: absent; no respiratory distress; CTAB Heart: regular rate and rhythm.  Skin: warm and dry Psychological: alert and cooperative; normal mood and affect   ASSESSMENT & PLAN:  1. Abnormal laboratory test   2. At high risk for tick borne illness     Meds ordered this encounter  Medications   doxycycline (VIBRAMYCIN) 100 MG capsule    Sig: Take 1 capsule  (100 mg total) by mouth 2 (two) times daily for 14 days.    Dispense:  28 capsule    Refill:  0    Order Specific Question:   Supervising Provider    Answer:   Raylene Everts [id number]   Will treat for tick born illness Doxycycline prescribed.  Take as directed and to completion To prevent tick bites, wear long sleeves, long pants, and light colors. Use insect repellent. Follow the instructions on the bottle. If the tick is biting, do not try to remove it with heat, alcohol, petroleum jelly, or fingernail polish. Use tweezers, curved forceps, or a tick-removal tool to grasp the tick. Gently pull up until the tick lets go. Do not twist or jerk the tick. Do not squeeze or crush  the tick. Return here or go to ER if you have any new or worsening symptoms (rash, nausea, vomiting, fever, chills, headache, fatigue)   Reviewed expectations re: course of current medical issues. Questions answered. Outlined signs and symptoms indicating need for more acute intervention. Patient verbalized understanding. After Visit Summary given.           Lestine Box, PA-C 06/22/21 1544

## 2021-06-22 NOTE — ED Triage Notes (Signed)
Lab resulted with positive tick borne illness on 8/5.  Continues to be nauseated and feel weak.

## 2021-06-22 NOTE — Discharge Instructions (Addendum)
Will treat for tick born illness Doxycycline prescribed.  Take as directed and to completion To prevent tick bites, wear long sleeves, long pants, and light colors. Use insect repellent. Follow the instructions on the bottle. If the tick is biting, do not try to remove it with heat, alcohol, petroleum jelly, or fingernail polish. Use tweezers, curved forceps, or a tick-removal tool to grasp the tick. Gently pull up until the tick lets go. Do not twist or jerk the tick. Do not squeeze or crush the tick. Return here or go to ER if you have any new or worsening symptoms (rash, nausea, vomiting, fever, chills, headache, fatigue)

## 2021-06-26 ENCOUNTER — Other Ambulatory Visit: Payer: Self-pay

## 2021-06-26 ENCOUNTER — Encounter: Payer: Self-pay | Admitting: Internal Medicine

## 2021-06-26 ENCOUNTER — Ambulatory Visit (INDEPENDENT_AMBULATORY_CARE_PROVIDER_SITE_OTHER): Payer: 59 | Admitting: Internal Medicine

## 2021-06-26 VITALS — BP 132/80 | HR 94 | Temp 98.4°F | Resp 18 | Ht 75.0 in | Wt 240.1 lb

## 2021-06-26 DIAGNOSIS — K2270 Barrett's esophagus without dysplasia: Secondary | ICD-10-CM | POA: Diagnosis not present

## 2021-06-26 DIAGNOSIS — Z1159 Encounter for screening for other viral diseases: Secondary | ICD-10-CM

## 2021-06-26 DIAGNOSIS — F419 Anxiety disorder, unspecified: Secondary | ICD-10-CM | POA: Insufficient documentation

## 2021-06-26 DIAGNOSIS — R0609 Other forms of dyspnea: Secondary | ICD-10-CM

## 2021-06-26 DIAGNOSIS — Z7689 Persons encountering health services in other specified circumstances: Secondary | ICD-10-CM

## 2021-06-26 DIAGNOSIS — R748 Abnormal levels of other serum enzymes: Secondary | ICD-10-CM

## 2021-06-26 DIAGNOSIS — Z0001 Encounter for general adult medical examination with abnormal findings: Secondary | ICD-10-CM

## 2021-06-26 DIAGNOSIS — A7740 Ehrlichiosis, unspecified: Secondary | ICD-10-CM | POA: Diagnosis not present

## 2021-06-26 DIAGNOSIS — K219 Gastro-esophageal reflux disease without esophagitis: Secondary | ICD-10-CM

## 2021-06-26 DIAGNOSIS — L309 Dermatitis, unspecified: Secondary | ICD-10-CM

## 2021-06-26 DIAGNOSIS — R55 Syncope and collapse: Secondary | ICD-10-CM

## 2021-06-26 DIAGNOSIS — K5792 Diverticulitis of intestine, part unspecified, without perforation or abscess without bleeding: Secondary | ICD-10-CM | POA: Insufficient documentation

## 2021-06-26 DIAGNOSIS — Z0000 Encounter for general adult medical examination without abnormal findings: Secondary | ICD-10-CM

## 2021-06-26 DIAGNOSIS — Z114 Encounter for screening for human immunodeficiency virus [HIV]: Secondary | ICD-10-CM

## 2021-06-26 DIAGNOSIS — R0600 Dyspnea, unspecified: Secondary | ICD-10-CM

## 2021-06-26 HISTORY — DX: Syncope and collapse: R55

## 2021-06-26 MED ORDER — ALBUTEROL SULFATE HFA 108 (90 BASE) MCG/ACT IN AERS: 2.0000 | INHALATION_SPRAY | Freq: Four times a day (QID) | RESPIRATORY_TRACT | 0 refills | Status: DC | PRN

## 2021-06-26 NOTE — Progress Notes (Signed)
New Patient Office Visit  Subjective:  Patient ID: Elijah Shaffer, male    DOB: 01-27-1981  Age: 40 y.o. MRN: [id number]  CC:  Chief Complaint  Patient presents with   New Patient (Initial Visit)    [facility] he was throwing up running a fever saw some kind of infection then he went to [facility] they did blood work found out he had tick disease has been feeling fatigued and tired all the time he is still having fevers they come and go and body aches these symptoms have been going on for about 2 weeks     HPI Elijah Shaffer is a 40 year old male with PMH of GERD, eczema, anxiety and Ehrlichiosis who presents for establishing care.  He was recently evaluated twice in the ER for fever, fatigue and nausea.  He is being treated with doxycycline for Ehrlichiosis.  He reports exposure to tick bites multiple times recently.  Denies noticing any particular rash recently.  He still complains of low-grade fever and fatigue.  He has a history of GERD and Barrett's esophagus, for which she takes Protonix.  Denies any dysphagia or odynophagia.  He has a history of GAD, for which he takes Atarax as needed.  He complains of intermittent dyspnea on exertion.  But he denies any chest pain, palpitations, or LE edema.  He has not had COVID-vaccine yet.  Past Medical History:  Diagnosis Date   Acute renal injury ([place]) 06/28/2017   Anxiety    Aspiration pneumonia of right upper lobe due to gastric secretions ([place])    Diverticulitis    GERD (gastroesophageal reflux disease) 01/08/2018   Seasonal allergies    Syncope 06/26/2021    Past Surgical History:  Procedure Laterality Date   BIOPSY  03/18/2021   Procedure: BIOPSY;  Surgeon: Harvel Quale, MD;  [facility];  Service: Gastroenterology;;  esophageal at [facility]   BIOPSY  05/23/2021   Procedure: BIOPSY;  Surgeon: Harvel Quale, MD;  [facility];  Service: Gastroenterology;;    COLONOSCOPY N/A 09/22/2017   Procedure: COLONOSCOPY;  Surgeon: Rogene Houston, MD;  [facility];  Service: Endoscopy;  Laterality: N/A;  9:55   COLONOSCOPY WITH PROPOFOL N/A 05/23/2021   Procedure: COLONOSCOPY WITH PROPOFOL;  Surgeon: Harvel Quale, MD;  [facility];  Service: Gastroenterology;  Laterality: N/A;  7:30   ESOPHAGOGASTRODUODENOSCOPY (EGD) WITH PROPOFOL N/A 03/18/2021   Procedure: ESOPHAGOGASTRODUODENOSCOPY (EGD) WITH PROPOFOL;  Surgeon: Harvel Quale, MD;  [facility];  Service: Gastroenterology;  Laterality: N/A;   ESOPHAGOGASTRODUODENOSCOPY (EGD) WITH PROPOFOL N/A 05/23/2021   Procedure: ESOPHAGOGASTRODUODENOSCOPY (EGD) WITH PROPOFOL;  Surgeon: Harvel Quale, MD;  [facility];  Service: Gastroenterology;  Laterality: N/A;   POLYPECTOMY  09/22/2017   Procedure: POLYPECTOMY;  Surgeon: Rogene Houston, MD;  [facility];  Service: Endoscopy;;    Family History  Problem Relation Age of Onset   Colon cancer Maternal Grandmother    Colon cancer Maternal Grandfather    Healthy Mother    Healthy Father     Social History   Socioeconomic History   Marital status: Single    Spouse name: Not on file   Number of children: Not on file   Years of education: Not on file   Highest education level: Not on file  Occupational History   Not on file  Tobacco Use   Smoking status:  Never   Smokeless tobacco: Never  Vaping Use   Vaping Use: Never used  Substance and Sexual Activity   Alcohol use: Yes    Comment: occasional   Drug use: No   Sexual activity: Not on file  Other Topics Concern   Not on file  Social History Narrative   Not on file   Social Determinants of Health   Financial Resource Strain: Not on file  Food Insecurity: Not on file  Transportation Needs: Not on file  Physical Activity: Not on file  Stress: Not on file  Social Connections: Not on file  Intimate Partner  Violence: Not on file    ROS Review of Systems  Constitutional:  Positive for fatigue and fever. Negative for chills.  HENT:  Negative for congestion and sore throat.   Eyes:  Negative for pain and discharge.  Respiratory:  Positive for shortness of breath. Negative for cough.   Cardiovascular:  Negative for chest pain and palpitations.  Gastrointestinal:  Negative for constipation, diarrhea, nausea and vomiting.  Endocrine: Negative for polydipsia and polyuria.  Genitourinary:  Negative for dysuria and hematuria.  Musculoskeletal:  Negative for neck pain and neck stiffness.  Skin:  Negative for rash.  Neurological:  Negative for dizziness, weakness, numbness and headaches.  Psychiatric/Behavioral:  Positive for sleep disturbance. Negative for agitation and behavioral problems. The patient is nervous/anxious.    Objective:   Today's Vitals: BP 132/80 (BP Location: Left Arm, Patient Position: Sitting, Cuff Size: Normal)   Pulse 94   Temp 98.4 F (36.9 C) (Oral)   Resp 18   Ht _0  (1.905 m)   Wt 240 lb 1.9 oz (108.9 kg)   SpO2 96%   BMI 30.01 kg/m   Physical Exam Vitals reviewed.  Constitutional:      General: He is not in acute distress.    Appearance: He is not diaphoretic.  HENT:     Head: Normocephalic and atraumatic.     Nose: Nose normal.     Mouth/Throat:     Mouth: Mucous membranes are moist.  Eyes:     General: No scleral icterus.    Extraocular Movements: Extraocular movements intact.  Cardiovascular:     Rate and Rhythm: Normal rate and regular rhythm.     Pulses: Normal pulses.     Heart sounds: Normal heart sounds. No murmur heard. Pulmonary:     Breath sounds: Normal breath sounds. No wheezing or rales.  Abdominal:     Palpations: Abdomen is soft.     Tenderness: There is no abdominal tenderness.  Musculoskeletal:     Cervical back: Neck supple. No tenderness.     Right lower leg: No edema.     Left lower leg: No edema.  Skin:    General: Skin  is warm.     Findings: No rash.  Neurological:     General: No focal deficit present.     Mental Status: He is alert and oriented to person, place, and time.  Psychiatric:        Mood and Affect: Mood normal.        Behavior: Behavior normal.    Assessment & Plan:   Problem List Items Addressed This Visit       Encounter to establish care - Primary   Care established History and medications reviewed with the patient     Relevant Orders  CBC with Differential/Platelet  CMP14+EGFR    Digestive   GERD (gastroesophageal reflux disease) (Chronic)  On Pantoprazole 40 mg QD      Relevant Medications   meclizine (ANTIVERT) 12.5 MG tablet   Barrett's esophagus    On Pantoprazole 40 mg QD Followed by GI        Musculoskeletal and Integument   Eczema    Kenalog cream PRN.        Other      Ehrlichiosis    IgG positive in ER On Doxycycline currently      Elevated lipase    During recent ER visit No epigastric pain and CT abdomen in ER was negative for signs of pancreatitis according to the patient Will check lipase again Can be elevated as part of recent acute stressor/infection      Relevant Orders   Lipase   Anxiety    Vistaril PRN      Dyspnea on exertion    Fatigue and dyspnea on exertion Could be a component of asthma and/or anxiety Trial of Albuterol PRN If persistent, will obtain imaging and PFTs Check CBC, CMP and HIV      Relevant Medications   albuterol (VENTOLIN HFA) 108 (90 Base) MCG/ACT inhaler   Other Visit Diagnoses     Annual physical exam       Relevant Orders   CBC with Differential/Platelet   CMP14+EGFR   Lipid panel   TSH   Encounter for screening for HIV       Relevant Orders   HIV antibody (with reflex)   Need for hepatitis C screening test       Relevant Orders   Hepatitis C Antibody       Outpatient Encounter Medications as of 06/26/2021  Medication Sig   acetaminophen (TYLENOL) 500 MG tablet Take 500 mg by  mouth every 6 (six) hours as needed.   b complex vitamins capsule Take 1 capsule by mouth daily. With D3 gummie   cetirizine (ZYRTEC) 10 MG tablet Take 10 mg by mouth daily.   docusate sodium (COLACE) 100 MG capsule Take 100 mg by mouth daily.   doxycycline (VIBRAMYCIN) 100 MG capsule Take 1 capsule (100 mg total) by mouth 2 (two) times daily for 14 days.   fluticasone (FLONASE ALLERGY RELIEF) 50 MCG/ACT nasal spray Place 2 sprays into both nostrils daily. (Patient taking differently: Place 2 sprays into both nostrils daily as needed for allergies or rhinitis.)   guaiFENesin (MUCINEX) 600 MG 12 hr tablet Take 600 mg by mouth 2 (two) times daily as needed for to loosen phlegm.   hydrOXYzine (ATARAX/VISTARIL) 10 MG tablet Take 1 tablet (10 mg total) by mouth 3 (three) times daily as needed for anxiety.   meclizine (ANTIVERT) 12.5 MG tablet meclizine 12.5 mg tablet  TAKE 1 TABLET BY MOUTH THREE TIMES DAILY FOR DIZZINESS   Multiple Vitamins-Minerals (MULTIVITAMIN WITH MINERALS) tablet Take 1 tablet by mouth daily. Patch   omega-3 acid ethyl esters (LOVAZA) 1 g capsule Take 1 g by mouth daily.   ondansetron (ZOFRAN) 4 MG tablet Take 1 tablet (4 mg total) by mouth every 6 (six) hours as needed for nausea.   pantoprazole (PROTONIX) 40 MG tablet Take 1 tablet (40 mg total) by mouth 2 (two) times daily.   triamcinolone ointment (KENALOG) 0.5 % Apply 1 application topically daily as needed (itching).   [DISCONTINUED] albuterol (VENTOLIN HFA) 108 (90 Base) MCG/ACT inhaler Inhale 2 puffs into the lungs every 6 (six) hours as needed for shortness of breath.   [DISCONTINUED] ketorolac (TORADOL) 10 MG tablet Take by mouth.  albuterol (VENTOLIN HFA) 108 (90 Base) MCG/ACT inhaler Inhale 2 puffs into the lungs every 6 (six) hours as needed for shortness of breath.   [DISCONTINUED] cyanocobalamin 1000 MCG tablet Take 1,000 mcg by mouth daily. Patch (Patient not taking: Reported on 06/26/2021)   [DISCONTINUED]  promethazine-dextromethorphan (PROMETHAZINE-DM) 6.25-15 MG/5ML syrup Take 5 mLs by mouth 3 (three) times daily as needed for cough. (Patient not taking: No sig reported)   No facility-administered encounter medications on file as of 06/26/2021.    Follow-up: Return in about 6 weeks (around 08/07/2021).   Lindell Spar, MD

## 2021-06-26 NOTE — Patient Instructions (Signed)
Please complete course of Doxycycline as prescribed.  Please use Albuterol inhaler as needed for shortness of breath as prescribed.  Please get fasting blood tests done before the next visit.

## 2021-06-28 ENCOUNTER — Encounter: Payer: Self-pay | Admitting: Internal Medicine

## 2021-06-28 DIAGNOSIS — R0609 Other forms of dyspnea: Secondary | ICD-10-CM | POA: Insufficient documentation

## 2021-06-28 DIAGNOSIS — R0600 Dyspnea, unspecified: Secondary | ICD-10-CM | POA: Insufficient documentation

## 2021-06-28 DIAGNOSIS — R0602 Shortness of breath: Secondary | ICD-10-CM | POA: Insufficient documentation

## 2021-06-28 NOTE — Assessment & Plan Note (Signed)
Vistaril PRN

## 2021-06-28 NOTE — Assessment & Plan Note (Signed)
On Pantoprazole 40 mg QD

## 2021-06-28 NOTE — Assessment & Plan Note (Signed)
On Pantoprazole 40 mg QD Followed by GI

## 2021-06-28 NOTE — Assessment & Plan Note (Signed)
IgG positive in ER On Doxycycline currently

## 2021-06-28 NOTE — Assessment & Plan Note (Signed)
Fatigue and dyspnea on exertion Could be a component of asthma and/or anxiety Trial of Albuterol PRN If persistent, will obtain imaging and PFTs Check CBC, CMP and HIV

## 2021-06-28 NOTE — Assessment & Plan Note (Signed)
Kenalog cream PRN.

## 2021-06-28 NOTE — Assessment & Plan Note (Signed)
During recent ER visit No epigastric pain and CT abdomen in ER was negative for signs of pancreatitis according to the patient Will check lipase again Can be elevated as part of recent acute stressor/infection

## 2021-06-28 NOTE — Assessment & Plan Note (Signed)
Care established History and medications reviewed with the patient 

## 2021-06-29 ENCOUNTER — Encounter (HOSPITAL_COMMUNITY): Payer: Self-pay

## 2021-06-29 ENCOUNTER — Ambulatory Visit (HOSPITAL_COMMUNITY): Payer: 59

## 2021-07-18 ENCOUNTER — Encounter: Payer: Self-pay | Admitting: Nurse Practitioner

## 2021-07-18 ENCOUNTER — Other Ambulatory Visit: Payer: Self-pay

## 2021-07-18 ENCOUNTER — Ambulatory Visit (INDEPENDENT_AMBULATORY_CARE_PROVIDER_SITE_OTHER): Payer: 59 | Admitting: Nurse Practitioner

## 2021-07-18 ENCOUNTER — Ambulatory Visit: Payer: 59 | Admitting: Nurse Practitioner

## 2021-07-18 VITALS — Temp 98.4°F | Ht 75.0 in | Wt 240.0 lb

## 2021-07-18 DIAGNOSIS — R059 Cough, unspecified: Secondary | ICD-10-CM

## 2021-07-18 MED ORDER — DOXYCYCLINE HYCLATE 100 MG PO TABS: 100.0000 mg | ORAL_TABLET | Freq: Two times a day (BID) | ORAL | 0 refills | Status: DC

## 2021-07-18 NOTE — Progress Notes (Signed)
Acute Office Visit  Subjective:    Patient ID: Elijah Shaffer, male    DOB: [birth year], 40 y.o.   MRN: [id number]  Chief Complaint  Patient presents with   Headache    Ongoing x1 week    Cough    Ongoing x1 week, non-productive    Headache  Associated symptoms include coughing. Pertinent negatives include no fever or weakness.  Cough Associated symptoms include headaches. Pertinent negatives include no chills or fever.  Patient is in today for sick visit.   He states that he had some eye twitching and seeing shadows in his peripheral vision. He has been seeing Dr. Merlene Laughter. He states he had a blackout episode last Friday, and he has an MRI scheduled for tomorrow.   Past Medical History:  Diagnosis Date   Acute renal injury ([place]) 06/28/2017   Anxiety    Aspiration pneumonia of right upper lobe due to gastric secretions ([place])    Diverticulitis    GERD (gastroesophageal reflux disease) 01/08/2018   Seasonal allergies    Syncope 06/26/2021    Past Surgical History:  Procedure Laterality Date   BIOPSY  03/18/2021   Procedure: BIOPSY;  Surgeon: Harvel Quale, MD;  [facility];  Service: Gastroenterology;;  esophageal at [facility]   BIOPSY  05/23/2021   Procedure: BIOPSY;  Surgeon: Harvel Quale, MD;  [facility];  Service: Gastroenterology;;   COLONOSCOPY N/A 09/22/2017   Procedure: COLONOSCOPY;  Surgeon: Rogene Houston, MD;  [facility];  Service: Endoscopy;  Laterality: N/A;  9:55   COLONOSCOPY WITH PROPOFOL N/A 05/23/2021   Procedure: COLONOSCOPY WITH PROPOFOL;  Surgeon: Harvel Quale, MD;  [facility];  Service: Gastroenterology;  Laterality: N/A;  7:30   ESOPHAGOGASTRODUODENOSCOPY (EGD) WITH PROPOFOL N/A 03/18/2021   Procedure: ESOPHAGOGASTRODUODENOSCOPY (EGD) WITH PROPOFOL;  Surgeon: Harvel Quale, MD;  [facility];  Service: Gastroenterology;  Laterality: N/A;    ESOPHAGOGASTRODUODENOSCOPY (EGD) WITH PROPOFOL N/A 05/23/2021   Procedure: ESOPHAGOGASTRODUODENOSCOPY (EGD) WITH PROPOFOL;  Surgeon: Harvel Quale, MD;  [facility];  Service: Gastroenterology;  Laterality: N/A;   POLYPECTOMY  09/22/2017   Procedure: POLYPECTOMY;  Surgeon: Rogene Houston, MD;  [facility];  Service: Endoscopy;;    Family History  Problem Relation Age of Onset   Colon cancer Maternal Grandmother    Colon cancer Maternal Grandfather    Healthy Mother    Healthy Father     Social History   Socioeconomic History   Marital status: Single    Spouse name: Not on file   Number of children: Not on file   Years of education: Not on file   Highest education level: Not on file  Occupational History   Not on file  Tobacco Use   Smoking status: Never   Smokeless tobacco: Never  Vaping Use   Vaping Use: Never used  Substance and Sexual Activity   Alcohol use: Yes    Comment: occasional   Drug use: No   Sexual activity: Not on file  Other Topics Concern   Not on file  Social History Narrative   Not on file   Social Determinants of Health   Financial Resource Strain: Not on file  Food Insecurity: Not on file  Transportation Needs: Not on file  Physical Activity: Not on file  Stress: Not on file  Social Connections: Not on file  Intimate Partner Violence: Not on file  Outpatient Medications Prior to Visit  Medication Sig Dispense Refill   acetaminophen (TYLENOL) 500 MG tablet Take 500 mg by mouth every 6 (six) hours as needed.     albuterol (VENTOLIN HFA) 108 (90 Base) MCG/ACT inhaler Inhale 2 puffs into the lungs every 6 (six) hours as needed for shortness of breath. 8 g 0   b complex vitamins capsule Take 1 capsule by mouth daily. With D3 gummie     betamethasone dipropionate 0.05 % cream Apply topically 2 (two) times daily as needed.     cetirizine (ZYRTEC) 10 MG tablet Take 10 mg by mouth daily.     docusate sodium  (COLACE) 100 MG capsule Take 100 mg by mouth daily.     fluticasone (FLONASE ALLERGY RELIEF) 50 MCG/ACT nasal spray Place 2 sprays into both nostrils daily. (Patient taking differently: Place 2 sprays into both nostrils daily as needed for allergies or rhinitis.) 16 g 2   guaiFENesin (MUCINEX) 600 MG 12 hr tablet Take 600 mg by mouth 2 (two) times daily as needed for to loosen phlegm.     hydrOXYzine (ATARAX/VISTARIL) 10 MG tablet Take 1 tablet (10 mg total) by mouth 3 (three) times daily as needed for anxiety. 30 tablet 0   meclizine (ANTIVERT) 12.5 MG tablet meclizine 12.5 mg tablet  TAKE 1 TABLET BY MOUTH THREE TIMES DAILY FOR DIZZINESS     Multiple Vitamins-Minerals (MULTIVITAMIN WITH MINERALS) tablet Take 1 tablet by mouth daily. Patch     omega-3 acid ethyl esters (LOVAZA) 1 g capsule Take 1 g by mouth daily.     ondansetron (ZOFRAN) 4 MG tablet Take 1 tablet (4 mg total) by mouth every 6 (six) hours as needed for nausea. 20 tablet 0   pantoprazole (PROTONIX) 40 MG tablet Take 1 tablet (40 mg total) by mouth 2 (two) times daily. 180 tablet 3   triamcinolone ointment (KENALOG) 0.5 % Apply 1 application topically daily as needed (itching). (Patient not taking: Reported on 07/18/2021)     No facility-administered medications prior to visit.    Allergies  Allergen Reactions   Pollen Extract    Claritin [Loratadine] Other (See Comments)    Heart race    Review of Systems  Constitutional:  Positive for fatigue. Negative for chills and fever.  HENT: Negative.    Respiratory:  Positive for cough.   Cardiovascular: Negative.   Neurological:  Positive for headaches. Negative for weakness.      Objective:    Physical Exam  Temp 98.4 F (36.9 C)   Ht '6\' 3"'$  (1.905 m)   Wt 240 lb (108.9 kg)   BMI 30.00 kg/m  Wt Readings from Last 3 Encounters:  07/18/21 240 lb (108.9 kg)  06/26/21 240 lb 1.9 oz (108.9 kg)  05/23/21 240 lb (108.9 kg)    Health Maintenance Due  Topic Date Due    Hepatitis C Screening  Never done   INFLUENZA VACCINE  Never done    There are no preventive care reminders to display for this patient.   Lab Results  Component Value Date   TSH 1.394 03/17/2021   Lab Results  Component Value Date   WBC 13.0 (H) 06/03/2021   HGB 16.8 06/03/2021   HCT 48.9 06/03/2021   MCV 90.9 06/03/2021   PLT 280 06/03/2021   Lab Results  Component Value Date   NA 136 06/03/2021   K 4.0 06/03/2021   CO2 26 06/03/2021   GLUCOSE 106 (H) 06/03/2021   BUN 9  06/03/2021   CREATININE 1.31 (H) 06/03/2021   BILITOT 0.4 06/03/2021   ALKPHOS 63 06/03/2021   AST 25 06/03/2021   ALT 31 06/03/2021   PROT 7.3 06/03/2021   ALBUMIN 4.1 06/03/2021   CALCIUM 9.5 06/03/2021   ANIONGAP 8 06/03/2021   Lab Results  Component Value Date   CHOL 154 02/24/2021   Lab Results  Component Value Date   HDL 43 02/24/2021   Lab Results  Component Value Date   LDLCALC 94 02/24/2021   Lab Results  Component Value Date   TRIG 85 02/24/2021   Lab Results  Component Value Date   CHOLHDL 3.6 02/24/2021   Lab Results  Component Value Date   HGBA1C 5.4 02/24/2021       Assessment & Plan:   Problem List Items Addressed This Visit       Other   Cough - Primary    -started 06/15/21 and he went to [facility] -he had positive IgG for Erlichosis, and he was treated with doxycycline -he states he felt better with doxycycline, but when the doxycycline ran out... 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 Doxy; will try a second round since he did improve some with the first round -has f/u appt already scheduled       Relevant Medications   doxycycline (VIBRA-TABS) 100 MG tablet    Other Relevant Orders   Ambulatory referral to Infectious Disease   DG Chest 2 View   Novel Coronavirus, NAA ([facility])     Meds ordered this encounter  Medications   doxycycline (VIBRA-TABS) 100 MG tablet    Sig: Take 1 tablet (100 mg total) by mouth 2 (two) times daily.    Dispense:  20 tablet    Refill:  0   Date:  07/18/2021   Location of Patient: Home Location of Provider: Office Consent was obtain for visit to be over via telehealth. I verified that I am speaking with the correct person using two identifiers.  I connected with  Isaiah Blakes on 07/18/21 via telephone and verified that I am speaking with the correct person using two identifiers.   I discussed the limitations of evaluation and management by telemedicine. The patient expressed understanding and agreed to proceed.  Time spent: 21 min   Noreene Larsson, NP

## 2021-07-18 NOTE — Assessment & Plan Note (Signed)
-  started 06/15/21 and he went to [facility] -he had positive IgG for Erlichosis, and he was treated with doxycycline -he states he felt better with doxycycline, but when the doxycycline ran out... 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 Doxy; will try a second round since he did improve some with the first round -has f/u appt already scheduled

## 2021-07-19 ENCOUNTER — Ambulatory Visit: Payer: 59

## 2021-07-19 ENCOUNTER — Ambulatory Visit (HOSPITAL_COMMUNITY)
Admission: RE | Admit: 2021-07-19 | Discharge: 2021-07-19 | Disposition: A | Discharge status: Home / Self Care | Payer: 59 | Source: Ambulatory Visit | Attending: Nurse Practitioner | Admitting: Nurse Practitioner

## 2021-07-19 ENCOUNTER — Ambulatory Visit (HOSPITAL_COMMUNITY)
Admission: RE | Admit: 2021-07-19 | Discharge: 2021-07-19 | Disposition: A | Discharge status: Home / Self Care | Payer: 59 | Source: Ambulatory Visit | Attending: Neurology | Admitting: Neurology

## 2021-07-19 ENCOUNTER — Other Ambulatory Visit: Payer: Self-pay | Admitting: Internal Medicine

## 2021-07-19 ENCOUNTER — Other Ambulatory Visit: Payer: Self-pay

## 2021-07-19 DIAGNOSIS — Z20822 Contact with and (suspected) exposure to covid-19: Secondary | ICD-10-CM

## 2021-07-19 DIAGNOSIS — R059 Cough, unspecified: Secondary | ICD-10-CM

## 2021-07-19 DIAGNOSIS — R519 Headache, unspecified: Secondary | ICD-10-CM | POA: Insufficient documentation

## 2021-07-19 DIAGNOSIS — E782 Mixed hyperlipidemia: Secondary | ICD-10-CM

## 2021-07-19 LAB — CBC WITH DIFFERENTIAL/PLATELET
Basophils Absolute: 0.1 10*3/uL (ref 0.0–0.2)
Basos: 1 %
EOS (ABSOLUTE): 0.1 10*3/uL (ref 0.0–0.4)
Eos: 1 %
Hematocrit: 50.4 % (ref 37.5–51.0)
Hemoglobin: 17.3 g/dL (ref 13.0–17.7)
Immature Grans (Abs): 0.1 10*3/uL (ref 0.0–0.1)
Immature Granulocytes: 1 %
Lymphocytes Absolute: 2.7 10*3/uL (ref 0.7–3.1)
Lymphs: 37 %
MCH: 30.3 pg (ref 26.6–33.0)
MCHC: 34.3 g/dL (ref 31.5–35.7)
MCV: 88 fL (ref 79–97)
Monocytes Absolute: 0.6 10*3/uL (ref 0.1–0.9)
Monocytes: 8 %
Neutrophils Absolute: 3.9 10*3/uL (ref 1.4–7.0)
Neutrophils: 52 %
Platelets: 235 10*3/uL (ref 150–450)
RBC: 5.71 x10E6/uL (ref 4.14–5.80)
RDW: 13 % (ref 11.6–15.4)
WBC: 7.4 10*3/uL (ref 3.4–10.8)

## 2021-07-19 LAB — CMP14+EGFR
ALT: 48 IU/L — ABNORMAL HIGH (ref 0–44)
AST: 31 IU/L (ref 0–40)
Albumin/Globulin Ratio: 1.9 (ref 1.2–2.2)
Albumin: 5 g/dL (ref 4.0–5.0)
Alkaline Phosphatase: 76 IU/L (ref 44–121)
BUN/Creatinine Ratio: 11 (ref 9–20)
BUN: 12 mg/dL (ref 6–24)
Bilirubin Total: 0.5 mg/dL (ref 0.0–1.2)
CO2: 22 mmol/L (ref 20–29)
Calcium: 10.2 mg/dL (ref 8.7–10.2)
Chloride: 101 mmol/L (ref 96–106)
Creatinine, Ser: 1.12 mg/dL (ref 0.76–1.27)
Globulin, Total: 2.6 g/dL (ref 1.5–4.5)
Glucose: 94 mg/dL (ref 65–99)
Potassium: 4.4 mmol/L (ref 3.5–5.2)
Sodium: 138 mmol/L (ref 134–144)
Total Protein: 7.6 g/dL (ref 6.0–8.5)
eGFR: 85 mL/min/{1.73_m2} (ref >59)

## 2021-07-19 LAB — LIPASE: Lipase: 31 U/L (ref 13–78)

## 2021-07-19 LAB — LIPID PANEL
Chol/HDL Ratio: 6.8 ratio — ABNORMAL HIGH (ref 0.0–5.0)
Cholesterol, Total: 230 mg/dL — ABNORMAL HIGH (ref 100–199)
HDL: 34 mg/dL — ABNORMAL LOW (ref >39)
Triglycerides: 838 mg/dL (ref 0–149)

## 2021-07-19 LAB — HIV ANTIBODY (ROUTINE TESTING W REFLEX): HIV Screen 4th Generation wRfx: NONREACTIVE

## 2021-07-19 LAB — HEPATITIS C ANTIBODY: Hep C Virus Ab: <0.1 s/co ratio (ref 0.0–0.9)

## 2021-07-19 LAB — TSH: TSH: 1.44 u[IU]/mL (ref 0.450–4.500)

## 2021-07-19 MED ORDER — ROSUVASTATIN CALCIUM 20 MG PO TABS: 20.0000 mg | ORAL_TABLET | Freq: Every day | ORAL | 1 refills | Status: DC

## 2021-07-20 NOTE — Progress Notes (Signed)
CXR was negative for lung issues, but there is a small hiatal hernia present. Sometimes cough is caused by gastric reflux symptoms caused by a hiatal hernia. I saw that you are taking protonix twice a day, so try adding OTC mylanta AS NEEDED when you have a cough and see if that helps.

## 2021-07-21 LAB — NOVEL CORONAVIRUS, NAA: SARS-CoV-2, NAA: NOT DETECTED

## 2021-07-21 LAB — SARS-COV-2, NAA 2 DAY TAT

## 2021-07-23 NOTE — Progress Notes (Addendum)
NEUROLOGY CONSULTATION NOTE  Elijah Shaffer MRN: [id number] DOB: [birth year]  Referring provider: Norva Riffle, NP Primary care provider: Ihor Dow, MD  Reason for consult:  dizziness, syncope  Assessment/Plan:   Syncope - seizure vs convulsive syncope in setting of GI bleed and pneumonia. Multiple systemic symptoms - headache, fatigue, chills - unclear if related to ehrlichosis or viral illness. Episode of facial asymmetry - given his age and lack of risk factors, I doubt TIA.  Reported right sided numbness but the only thing objectively noted on exam was possible facial droop (only noted when he smiled).  Bell's palsy most likely.  My recommendation was to start with an EEG. Patient reports that he had an EEG done by another neurologist, Dr. Merlene Laughter, in [place].  He has an upcoming appointment.  Since his workup as already been initiated by Dr. Merlene Laughter, I recommended following up and continuing care with Dr. Merlene Laughter.   Subjective:  Elijah Shaffer is a 40 year old left-handed male who presents for dizziness and syncope.  History supplemented by hospital records and referring provider's note.  In April 2022, he was admitted to [facility] for left sided facial droop, right sided numbness and abdominal pain.  Did not receive t-PA due to mild symptoms (NIHSS score of 1 due to mild facial palsy).  As per tele-stroke neurologist's note, he did not exhibit facial asymmetry when talking or at rest, only when smiling.  CT head negative for acute findings.  MRI of brain negative for stroke.  MRA of head negative for LVO or stenosis.  Carotid ultrasound negative for stenosis.  Echocardiogram showed EF 60-65% with no cardiac source of embolus.  LDL was 94, Hgb A1c 5.4, HIV negative, Sars Coronovirus 2 negative, UDS positive only for benzo.  It was decided that patient likely had a Bell's palsy vs TIA.  He was discharged on acyclovir and was supposed to be discharged on ASA and  Plavix but appears it was never prescribed.  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  He began not feeling well including headache, chills and fever.  In May, he was outside when he had a recurrence of this and then passed out.  He was told that he was shaking on the ground and was difficult to arouse when EMS arrived.  He vomited and had coffee-ground emesis.  He was admitted to the hospital where he was diagnosed with upper GI bleed and aspiration pneumonia.  He was discharged on PPI and antibiotics.  Since then, he has been to the ED on several occasions for headache, fatigue and fever and chills.  He was diagnosed with viral illness,  acute pharyngitis and dehydration.  One of the labs from the ED in August was positive for ehrlichosis and he was prescribed doxycycline.  He continued to have systemic symptoms such as cough and recently was prescribed another round of doxycycline.  He has an appointment with ID.  MRI of brain on 07/19/2021 personally reviewed was normal.       PAST MEDICAL HISTORY: Past Medical History:  Diagnosis Date   Acute renal injury ([place]) 06/28/2017   Anxiety    Aspiration pneumonia of right upper lobe due to gastric secretions (HCC)    Diverticulitis    GERD (gastroesophageal reflux disease) 01/08/2018   Seasonal allergies    Syncope 06/26/2021    PAST SURGICAL HISTORY: Past Surgical History:  Procedure Laterality  Date   BIOPSY  03/18/2021   Procedure: BIOPSY;  Surgeon: Harvel Quale, MD;  [facility];  Service: Gastroenterology;;  esophageal at [facility]   BIOPSY  05/23/2021   Procedure: BIOPSY;  Surgeon: Harvel Quale, MD;  [facility];  Service: Gastroenterology;;   COLONOSCOPY N/A 09/22/2017   Procedure: COLONOSCOPY;  Surgeon: Rogene Houston, MD;  [facility];  Service: Endoscopy;  Laterality: N/A;  9:55   COLONOSCOPY WITH  PROPOFOL N/A 05/23/2021   Procedure: COLONOSCOPY WITH PROPOFOL;  Surgeon: Harvel Quale, MD;  [facility];  Service: Gastroenterology;  Laterality: N/A;  7:30   ESOPHAGOGASTRODUODENOSCOPY (EGD) WITH PROPOFOL N/A 03/18/2021   Procedure: ESOPHAGOGASTRODUODENOSCOPY (EGD) WITH PROPOFOL;  Surgeon: Harvel Quale, MD;  [facility];  Service: Gastroenterology;  Laterality: N/A;   ESOPHAGOGASTRODUODENOSCOPY (EGD) WITH PROPOFOL N/A 05/23/2021   Procedure: ESOPHAGOGASTRODUODENOSCOPY (EGD) WITH PROPOFOL;  Surgeon: Harvel Quale, MD;  [facility];  Service: Gastroenterology;  Laterality: N/A;   POLYPECTOMY  09/22/2017   Procedure: POLYPECTOMY;  Surgeon: Rogene Houston, MD;  [facility];  Service: Endoscopy;;    MEDICATIONS: Current Outpatient Medications on File Prior to Visit  Medication Sig Dispense Refill   acetaminophen (TYLENOL) 500 MG tablet Take 500 mg by mouth every 6 (six) hours as needed.     albuterol (VENTOLIN HFA) 108 (90 Base) MCG/ACT inhaler Inhale 2 puffs into the lungs every 6 (six) hours as needed for shortness of breath. 8 g 0   b complex vitamins capsule Take 1 capsule by mouth daily. With D3 gummie     betamethasone dipropionate 0.05 % cream Apply topically 2 (two) times daily as needed.     cetirizine (ZYRTEC) 10 MG tablet Take 10 mg by mouth daily.     docusate sodium (COLACE) 100 MG capsule Take 100 mg by mouth daily.     doxycycline (VIBRA-TABS) 100 MG tablet Take 1 tablet (100 mg total) by mouth 2 (two) times daily. 20 tablet 0   fluticasone (FLONASE ALLERGY RELIEF) 50 MCG/ACT nasal spray Place 2 sprays into both nostrils daily. (Patient taking differently: Place 2 sprays into both nostrils daily as needed for allergies or rhinitis.) 16 g 2   guaiFENesin (MUCINEX) 600 MG 12 hr tablet Take 600 mg by mouth 2 (two) times daily as needed for to loosen phlegm.     hydrOXYzine (ATARAX/VISTARIL) 10 MG  tablet Take 1 tablet (10 mg total) by mouth 3 (three) times daily as needed for anxiety. 30 tablet 0   meclizine (ANTIVERT) 12.5 MG tablet meclizine 12.5 mg tablet  TAKE 1 TABLET BY MOUTH THREE TIMES DAILY FOR DIZZINESS     Multiple Vitamins-Minerals (MULTIVITAMIN WITH MINERALS) tablet Take 1 tablet by mouth daily. Patch     omega-3 acid ethyl esters (LOVAZA) 1 g capsule Take 1 g by mouth daily.     ondansetron (ZOFRAN) 4 MG tablet Take 1 tablet (4 mg total) by mouth every 6 (six) hours as needed for nausea. 20 tablet 0   pantoprazole (PROTONIX) 40 MG tablet Take 1 tablet (40 mg total) by mouth 2 (two) times daily. 180 tablet 3   rosuvastatin (CRESTOR) 20 MG tablet Take 1 tablet (20 mg total) by mouth daily. 90 tablet 1   No current facility-administered medications on file prior to visit.    ALLERGIES: Allergies  Allergen Reactions   Pollen Extract    Claritin [Loratadine] Other (See Comments)  Heart race    FAMILY HISTORY: Family History  Problem Relation Age of Onset   Colon cancer Maternal Grandmother    Colon cancer Maternal Grandfather    Healthy Mother    Healthy Father     Objective:  Blood pressure (!) 152/67, pulse 88, height '6\' 3"'$  (1.905 m), weight 247 lb 9.6 oz (112.3 kg), SpO2 98 %. General: No acute distress.  Patient appears well-groomed.   Head:  Normocephalic/atraumatic Eyes:  fundi examined but not visualized Neck: supple, no paraspinal tenderness, full range of motion Back: No paraspinal tenderness Heart: regular rate and rhythm Lungs: Clear to auscultation bilaterally. Vascular: No carotid bruits. Neurological Exam: Mental status: alert and oriented to person, place, and time, recent and remote memory intact, fund of knowledge intact, attention and concentration intact, speech fluent and not dysarthric, language intact. Cranial nerves: CN I: not tested CN II: pupils equal, round and reactive to light, visual fields intact CN III, IV, VI:  full range  of motion, no nystagmus, no ptosis CN V: facial sensation intact. CN VII: upper and lower face symmetric CN VIII: hearing intact CN IX, X: gag intact, uvula midline CN XI: sternocleidomastoid and trapezius muscles intact CN XII: tongue midline Bulk & Tone: normal, no fasciculations. Motor:  muscle strength 5/5 throughout Sensation:  Pinprick, temperature and vibratory sensation intact. Deep Tendon Reflexes:  2+ throughout,  toes downgoing.   Finger to nose testing:  Without dysmetria.   Heel to shin:  Without dysmetria.   Gait:  Normal station and stride.  Romberg negative.    Thank you for allowing me to take part in the care of this patient.  Metta Clines, DO  CC: Ihor Dow, MD  Norva Riffle, NP

## 2021-07-24 ENCOUNTER — Other Ambulatory Visit: Payer: Self-pay

## 2021-07-24 ENCOUNTER — Encounter: Payer: Self-pay | Admitting: Pulmonary Disease

## 2021-07-24 ENCOUNTER — Encounter: Payer: Self-pay | Admitting: Neurology

## 2021-07-24 ENCOUNTER — Ambulatory Visit (INDEPENDENT_AMBULATORY_CARE_PROVIDER_SITE_OTHER): Payer: 59 | Admitting: Pulmonary Disease

## 2021-07-24 ENCOUNTER — Ambulatory Visit (INDEPENDENT_AMBULATORY_CARE_PROVIDER_SITE_OTHER): Payer: 59 | Admitting: Neurology

## 2021-07-24 ENCOUNTER — Ambulatory Visit (INDEPENDENT_AMBULATORY_CARE_PROVIDER_SITE_OTHER): Payer: 59 | Admitting: Internal Medicine

## 2021-07-24 ENCOUNTER — Encounter: Payer: Self-pay | Admitting: Internal Medicine

## 2021-07-24 VITALS — BP 152/67 | HR 88 | Ht 75.0 in | Wt 247.6 lb

## 2021-07-24 VITALS — BP 138/84 | HR 85 | Temp 98.1°F | Resp 16 | Ht 75.0 in | Wt 247.0 lb

## 2021-07-24 VITALS — BP 122/80 | HR 87 | Ht 75.0 in | Wt 246.6 lb

## 2021-07-24 DIAGNOSIS — R0602 Shortness of breath: Secondary | ICD-10-CM | POA: Diagnosis not present

## 2021-07-24 DIAGNOSIS — R519 Headache, unspecified: Secondary | ICD-10-CM

## 2021-07-24 DIAGNOSIS — R55 Syncope and collapse: Secondary | ICD-10-CM | POA: Diagnosis not present

## 2021-07-24 DIAGNOSIS — A7740 Ehrlichiosis, unspecified: Secondary | ICD-10-CM

## 2021-07-24 DIAGNOSIS — R059 Cough, unspecified: Secondary | ICD-10-CM

## 2021-07-24 NOTE — Progress Notes (Signed)
[facility]      Reason for Consult: multiple symptoms   Referring Physician: Dr. Posey Pronto    Patient ID: Elijah Shaffer, male    DOB: 14-Jan-1981, 40 y.o.   MRN: [id number]  HPI:   He is here for evaluation of multiple symptoms including headache, imbalance, weakness, fatigue, chills and recent serology positive for ehrlichia IgG.  Also with cough.  He recalls a tick bite in April and works outside in [place].  He had serology done by en ED provider in [facility] and included Ehrlichia, lyme disease and rocky  mountain spotted fever serology. There was no fever and his headache he described as mainly an imbalance rather than pain and he had no rash.  No leukopenia, no thrombocytopenia.  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  No convalescent serology done.  He has seen two different neurologists, gastroenterology, pulmonary, multiple urgent cares and EDs.  He has had an MRI, labs and EEG with no etiology.  He feels he is much better when he is taking doxycycline and gets worse again off of it.     Past Medical History:  Diagnosis Date   Acute renal injury ([place]) 06/28/2017   Anxiety    Aspiration pneumonia of right upper lobe due to gastric secretions (HCC)    Diverticulitis    GERD (gastroesophageal reflux disease) 01/08/2018   Seasonal allergies    Syncope 06/26/2021    Prior to Admission medications   Medication Sig Start Date End Date Taking? Authorizing Provider  acetaminophen (TYLENOL) 500 MG tablet Take 500 mg by mouth every 6 (six) hours as needed.   Yes [provider]  albuterol (VENTOLIN HFA) 108 (90 Base) MCG/ACT inhaler Inhale 2 puffs into the lungs every 6 (six) hours as needed for shortness of breath. 06/26/21  Yes Lindell Spar, MD  b complex vitamins capsule Take 1 capsule by mouth daily. With D3 gummie   Yes [provider]  betamethasone dipropionate 0.05 % cream Apply topically 2 (two)  times daily as needed.   Yes [provider]  cetirizine (ZYRTEC) 10 MG tablet Take 10 mg by mouth daily.   Yes [provider]  docusate sodium (COLACE) 100 MG capsule Take 100 mg by mouth daily.   Yes [provider]  doxycycline (VIBRA-TABS) 100 MG tablet Take 1 tablet (100 mg total) by mouth 2 (two) times daily. 07/18/21  Yes Noreene Larsson, NP  fluticasone [facility] ALLERGY RELIEF) 50 MCG/ACT nasal spray Place 2 sprays into both nostrils daily. Patient taking differently: Place 2 sprays into both nostrils daily as needed for allergies or rhinitis. 03/05/21  Yes Scot Jun, FNP  guaiFENesin (MUCINEX) 600 MG 12 hr tablet Take 600 mg by mouth 2 (two) times daily as needed for to loosen phlegm.   Yes [provider]  hydrOXYzine (ATARAX/VISTARIL) 10 MG tablet Take 1 tablet (10 mg total) by mouth 3 (three) times daily as needed for anxiety. 02/25/21  Yes Cristal Deer, MD  meclizine (ANTIVERT) 12.5 MG tablet meclizine 12.5 mg tablet  TAKE 1 TABLET BY MOUTH THREE TIMES DAILY FOR DIZZINESS   Yes [provider]  Multiple Vitamins-Minerals (MULTIVITAMIN WITH MINERALS) tablet Take 1 tablet by mouth daily. Patch   Yes [provider]  omega-3 acid ethyl esters (LOVAZA) 1 g capsule Take 1 g by mouth daily.   Yes [provider]  ondansetron (ZOFRAN) 4 MG tablet Take  1 tablet (4 mg total) by mouth every 6 (six) hours as needed for nausea. 06/04/21  Yes Scot Jun, FNP  pantoprazole (PROTONIX) 40 MG tablet Take 1 tablet (40 mg total) by mouth 2 (two) times daily. 05/23/21 08/21/21 Yes Harvel Quale, MD  rosuvastatin (CRESTOR) 20 MG tablet Take 1 tablet (20 mg total) by mouth daily. 07/19/21  Yes Lindell Spar, MD    Allergies  Allergen Reactions   Pollen Extract    Claritin [Loratadine] Other (See Comments)    Heart race    Social History   Tobacco Use   Smoking status: Never   Smokeless tobacco: Never  Vaping  Use   Vaping Use: Never used  Substance Use Topics   Alcohol use: Yes    Comment: occasional   Drug use: No    Family History  Problem Relation Age of Onset   Colon cancer Maternal Grandmother    Colon cancer Maternal Grandfather    Healthy Mother    Healthy Father     Review of Systems  Constitutional: negative for fevers and weight loss Respiratory: positive for cough, negative for sputum Gastrointestinal: negative for nausea and diarrhea Integument/breast: negative for rash Hematologic/lymphatic: negative for lymphadenopathy Musculoskeletal: negative for myalgias and arthralgias Neurological: negative for dizziness All other systems reviewed and are negative    Constitutional: in no apparent distress  Vitals:   07/24/21 1415  BP: 138/84  Pulse: 85  Resp: 16  Temp: 98.1 F (36.7 C)  SpO2: 98%   EYES: anicteric ENMT: no thrush Cardiovascular: Cor RRR Respiratory: clear Musculoskeletal: no pedal edema noted Skin: negatives: no rash Neuro: non-focal  Labs: Lab Results  Component Value Date   WBC 7.4 07/18/2021   HGB 17.3 07/18/2021   HCT 50.4 07/18/2021   MCV 88 07/18/2021   PLT 235 07/18/2021    Lab Results  Component Value Date   CREATININE 1.12 07/18/2021   BUN 12 07/18/2021   NA 138 07/18/2021   K 4.4 07/18/2021   CL 101 07/18/2021   CO2 22 07/18/2021    Lab Results  Component Value Date   ALT 48 (H) 07/18/2021   AST 31 07/18/2021   ALKPHOS 76 07/18/2021   BILITOT 0.5 07/18/2021   INR 1.0 02/23/2021     Assessment: constellation of symptoms of weakness/off balance feeling with falls, fatigue, chills with no fever, no lab abnormalities, no rash or other tick related concerns.   I discussed with him tick-related infections, symptoms, long term effects and once treated, no further antibiotic treatment indicated.  I also discussed that none of his symptoms are particularly specific to tick-related infections and no indication to take doxycycline.     Plan: 1)  will repeat the ehrlichia titer for convelescent testing No further testing indicated from an ID standpoint.  Return as needed

## 2021-07-24 NOTE — Patient Instructions (Signed)
Try albuterol inhaler 1-2 puffs every 4-6 hours as needed for shortness of breath and cough.   We will schedule you for pulmonary function tests at your earliest convenience and go over the results via phone or [place].

## 2021-07-24 NOTE — Progress Notes (Signed)
Synopsis: Referred in September 2022 for Cough and shortness of breath  Subjective:   PATIENT ID: Elijah Shaffer GENDER: male DOB: February 12, 1981, MRN: [id number]   HPI  Chief Complaint  Patient presents with   Consult    Referred by PCP for increased SOB with exertion for the past 4 months. Has a semi-productive cough with clearish, yellowish phlegm. Denies any chest tightness.    Elijah Shaffer is a 40 year old male, never smoker with history of GERD, hiatal hernia and seasonal allergies who is referred to pulmonary clinic for cough/shortness of breath.   He reports shortness of breath over the last 4 months.  The shortness of breath is evident with exertion and resolves with rest.  Patient has been treated for tickborne illness with concern for Ehrlichia and has been on doxycycline.  He is being evaluated by infectious disease today.  He has been much more inactive over these last 4 months.  He works as a Development worker, international aid and has not been working as much due to the shortness of breath.  He denies any wheezing or chest tightness.  He has intermittent dry cough that is occasionally productive with sputum.  He is being treated with PPI therapy for GERD and has a small hiatal hernia on CT imaging.  He does report seasonal allergies with nasal and sinus congestion more so in the spring and fall.  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  He denies any history of blood clots.  No family history of blood clots.  No family history of lung disease.  No history of smoking or vaping.  He does report some intermittent secondhand smoke exposure via social gatherings.  He did grow up in a home with a wood stove.  Past Medical History:  Diagnosis Date   Acute renal injury ([place]) 06/28/2017   Anxiety    Aspiration pneumonia of right upper lobe due to gastric secretions (HCC)    Diverticulitis    GERD (gastroesophageal reflux disease) 01/08/2018   Seasonal allergies    Syncope 06/26/2021      Family History  Problem Relation Age of Onset   Colon cancer Maternal Grandmother    Colon cancer Maternal Grandfather    Healthy Mother    Healthy Father      Social History   Socioeconomic History   Marital status: Single    Spouse name: Not on file   Number of children: Not on file   Years of education: Not on file   Highest education level: Not on file  Occupational History   Not on file  Tobacco Use   Smoking status: Never   Smokeless tobacco: Never  Vaping Use   Vaping Use: Never used  Substance and Sexual Activity   Alcohol use: Yes    Comment: occasional   Drug use: No   Sexual activity: Not on file  Other Topics Concern   Not on file  Social History Narrative   Not on file   Social Determinants of Health   Financial Resource Strain: Not on file  Food Insecurity: Not on file  Transportation Needs: Not on file  Physical Activity: Not on file  Stress: Not on file  Social Connections: Not on file  Intimate Partner Violence: Not on file     Allergies  Allergen Reactions   Pollen Extract    Claritin [Loratadine] Other (See Comments)    Heart race     Outpatient Medications Prior to Visit  Medication Sig Dispense Refill   acetaminophen (TYLENOL) 500 MG tablet Take 500 mg by mouth every 6 (six) hours as needed.     albuterol (VENTOLIN HFA) 108 (90 Base) MCG/ACT inhaler Inhale 2 puffs into the lungs every 6 (six) hours as needed for shortness of breath. 8 g 0   b complex vitamins capsule Take 1 capsule by mouth daily. With D3 gummie     betamethasone dipropionate 0.05 % cream Apply topically 2 (two) times daily as needed.     cetirizine (ZYRTEC) 10 MG tablet Take 10 mg by mouth daily.     docusate sodium (COLACE) 100 MG capsule Take 100 mg by mouth daily.     doxycycline (VIBRA-TABS) 100 MG tablet Take 1 tablet (100 mg total) by mouth 2 (two) times daily. 20 tablet 0   fluticasone (FLONASE ALLERGY RELIEF) 50 MCG/ACT nasal spray Place 2 sprays into both  nostrils daily. (Patient taking differently: Place 2 sprays into both nostrils daily as needed for allergies or rhinitis.) 16 g 2   guaiFENesin (MUCINEX) 600 MG 12 hr tablet Take 600 mg by mouth 2 (two) times daily as needed for to loosen phlegm.     hydrOXYzine (ATARAX/VISTARIL) 10 MG tablet Take 1 tablet (10 mg total) by mouth 3 (three) times daily as needed for anxiety. 30 tablet 0   meclizine (ANTIVERT) 12.5 MG tablet meclizine 12.5 mg tablet  TAKE 1 TABLET BY MOUTH THREE TIMES DAILY FOR DIZZINESS     Multiple Vitamins-Minerals (MULTIVITAMIN WITH MINERALS) tablet Take 1 tablet by mouth daily. Patch     omega-3 acid ethyl esters (LOVAZA) 1 g capsule Take 1 g by mouth daily.     ondansetron (ZOFRAN) 4 MG tablet Take 1 tablet (4 mg total) by mouth every 6 (six) hours as needed for nausea. 20 tablet 0   pantoprazole (PROTONIX) 40 MG tablet Take 1 tablet (40 mg total) by mouth 2 (two) times daily. 180 tablet 3   rosuvastatin (CRESTOR) 20 MG tablet Take 1 tablet (20 mg total) by mouth daily. 90 tablet 1   No facility-administered medications prior to visit.   Review of Systems  Constitutional:  Negative for chills, fever, malaise/fatigue and weight loss.  HENT:  Negative for congestion, sinus pain and sore throat.   Eyes: Negative.   Respiratory:  Positive for cough and shortness of breath. Negative for hemoptysis, sputum production and wheezing.   Cardiovascular:  Negative for chest pain, palpitations, orthopnea, claudication and leg swelling.  Gastrointestinal:  Positive for heartburn. Negative for abdominal pain, nausea and vomiting.  Genitourinary: Negative.   Musculoskeletal:  Negative for joint pain and myalgias.  Skin:  Negative for rash.  Neurological:  Negative for weakness.  Endo/Heme/Allergies: Negative.   Psychiatric/Behavioral: Negative.     Objective:   Vitals:   07/24/21 1047  BP: 122/80  Pulse: 87  SpO2: 96%  Weight: 246 lb 9.6 oz (111.9 kg)  Height: '6\' 3"'$  (1.905 m)     Physical Exam Constitutional:      General: He is not in acute distress. HENT:     Head: Normocephalic and atraumatic.  Eyes:     Extraocular Movements: Extraocular movements intact.     Conjunctiva/sclera: Conjunctivae normal.     Pupils: Pupils are equal, round, and reactive to light.  Cardiovascular:     Rate and Rhythm: Normal rate and regular rhythm.     Pulses: Normal pulses.     Heart sounds: Normal heart sounds. No murmur heard. Pulmonary:  Effort: Pulmonary effort is normal.     Breath sounds: Decreased breath sounds present. No wheezing, rhonchi or rales.  Abdominal:     General: Bowel sounds are normal.     Palpations: Abdomen is soft.  Musculoskeletal:     Right lower leg: No edema.     Left lower leg: No edema.  Lymphadenopathy:     Cervical: No cervical adenopathy.  Skin:    General: Skin is warm and dry.  Neurological:     General: No focal deficit present.     Mental Status: He is alert.  Psychiatric:        Mood and Affect: Mood normal.        Behavior: Behavior normal.        Thought Content: Thought content normal.        Judgment: Judgment normal.   CBC    Component Value Date/Time   WBC 7.4 07/18/2021 0831   WBC 13.0 (H) 06/03/2021 1744   RBC 5.71 07/18/2021 0831   RBC 5.38 06/03/2021 1744   HGB 17.3 07/18/2021 0831   HCT 50.4 07/18/2021 0831   PLT 235 07/18/2021 0831   MCV 88 07/18/2021 0831   MCH 30.3 07/18/2021 0831   MCH 31.2 06/03/2021 1744   MCHC 34.3 07/18/2021 0831   MCHC 34.4 06/03/2021 1744   RDW 13.0 07/18/2021 0831   LYMPHSABS 2.7 07/18/2021 0831   MONOABS 0.8 06/03/2021 1744   EOSABS 0.1 07/18/2021 0831   BASOSABS 0.1 07/18/2021 0831   BMP Latest Ref Rng & Units 07/18/2021 06/03/2021 03/20/2021  Glucose 65 - 99 mg/dL 94 106(H) 96  BUN 6 - 24 mg/dL '12 9 10  '$ Creatinine 0.76 - 1.27 mg/dL 1.12 1.31(H) 1.22  BUN/Creat Ratio 9 - 20 11 - -  Sodium 134 - 144 mmol/L 138 136 137  Potassium 3.5 - 5.2 mmol/L 4.4 4.0 3.9   Chloride 96 - 106 mmol/L 101 102 102  CO2 20 - 29 mmol/L '22 26 26  '$ Calcium 8.7 - 10.2 mg/dL 10.2 9.5 9.3   Chest imaging: CXR 07/19/21 Mild scarring of right lateral base. Small to moderate hiatal hernia.  Increased retrosternal airspace  PFT: No flowsheet data found.  Echo 02/24/21: LV EF 60-65%. Normal diastolic parameters. RV systolic function is normal. RV is mildly enlarged.   Assessment & Plan:   Shortness of breath  Discussion: Elijah Shaffer is a 40 year old male, never smoker with history of GERD, hiatal hernia and seasonal allergies who is referred to pulmonary clinic for cough/shortness of breath.   Patient's shortness of breath may be related to underlying obstructive lung disease versus deconditioning.  He does have increased retrosternal airspace on chest radiograph concerning for obstructive lung disease.  No significant peripheral eosinophilia on lab review.  Patient has normal echocardiogram in April 2022.  There is less concern for  thromboembolic disease given his normal echo but there is risk factors for DVT given his varicose veins of the right lower extremity.  We will check pulmonary function test at the initial evaluation.  We will trial the patient on as needed albuterol and monitor for any symptom benefit.  If his pulmonary function testing is abnormal we will check a CT chest scan for further evaluation.  We can also consider checking a D-dimer for thromboembolic disease.  For his sinus congestion, I recommend that he use fluticasone nasal spray 2 sprays per nostril daily.   If patient has improvement on albuterol inhaler we will then trial  him on daily maintenance inhaler and likely add daily montelukast for concern of asthma.  Follow-up in 2 months.  Freda Jackson, MD [facility] Office: [phone]   Current Outpatient Medications:    acetaminophen (TYLENOL) 500 MG tablet, Take 500 mg by mouth every 6 (six) hours as needed.,  Disp: , Rfl:    albuterol (VENTOLIN HFA) 108 (90 Base) MCG/ACT inhaler, Inhale 2 puffs into the lungs every 6 (six) hours as needed for shortness of breath., Disp: 8 g, Rfl: 0   b complex vitamins capsule, Take 1 capsule by mouth daily. With D3 gummie, Disp: , Rfl:    betamethasone dipropionate 0.05 % cream, Apply topically 2 (two) times daily as needed., Disp: , Rfl:    cetirizine (ZYRTEC) 10 MG tablet, Take 10 mg by mouth daily., Disp: , Rfl:    docusate sodium (COLACE) 100 MG capsule, Take 100 mg by mouth daily., Disp: , Rfl:    doxycycline (VIBRA-TABS) 100 MG tablet, Take 1 tablet (100 mg total) by mouth 2 (two) times daily., Disp: 20 tablet, Rfl: 0   fluticasone (FLONASE ALLERGY RELIEF) 50 MCG/ACT nasal spray, Place 2 sprays into both nostrils daily. (Patient taking differently: Place 2 sprays into both nostrils daily as needed for allergies or rhinitis.), Disp: 16 g, Rfl: 2   guaiFENesin (MUCINEX) 600 MG 12 hr tablet, Take 600 mg by mouth 2 (two) times daily as needed for to loosen phlegm., Disp: , Rfl:    hydrOXYzine (ATARAX/VISTARIL) 10 MG tablet, Take 1 tablet (10 mg total) by mouth 3 (three) times daily as needed for anxiety., Disp: 30 tablet, Rfl: 0   meclizine (ANTIVERT) 12.5 MG tablet, meclizine 12.5 mg tablet  TAKE 1 TABLET BY MOUTH THREE TIMES DAILY FOR DIZZINESS, Disp: , Rfl:    Multiple Vitamins-Minerals (MULTIVITAMIN WITH MINERALS) tablet, Take 1 tablet by mouth daily. Patch, Disp: , Rfl:    omega-3 acid ethyl esters (LOVAZA) 1 g capsule, Take 1 g by mouth daily., Disp: , Rfl:    ondansetron (ZOFRAN) 4 MG tablet, Take 1 tablet (4 mg total) by mouth every 6 (six) hours as needed for nausea., Disp: 20 tablet, Rfl: 0   pantoprazole (PROTONIX) 40 MG tablet, Take 1 tablet (40 mg total) by mouth 2 (two) times daily., Disp: 180 tablet, Rfl: 3   rosuvastatin (CRESTOR) 20 MG tablet, Take 1 tablet (20 mg total) by mouth daily., Disp: 90 tablet, Rfl: 1

## 2021-07-24 NOTE — Patient Instructions (Signed)
My recommendation is an EEG which has been performed by Dr. Merlene Laughter.  I recommend following up with him for next step.

## 2021-07-25 ENCOUNTER — Telehealth: Payer: Self-pay

## 2021-07-25 NOTE — Telephone Encounter (Signed)
Patient returning call on lab results.   He said he seen the infectious disease doctor and told him to stop the doxycyline that this was not his issue but when he stops this medicine he has symptoms. Patient call back # [phone].

## 2021-07-25 NOTE — Telephone Encounter (Signed)
Please schedule pt a virtual appt to discuss with dr patel

## 2021-07-25 NOTE — Telephone Encounter (Signed)
Appointment scheduled.

## 2021-07-26 ENCOUNTER — Other Ambulatory Visit: Payer: Self-pay | Admitting: Internal Medicine

## 2021-07-26 ENCOUNTER — Encounter: Payer: Self-pay | Admitting: Internal Medicine

## 2021-07-26 ENCOUNTER — Other Ambulatory Visit: Payer: Self-pay

## 2021-07-26 ENCOUNTER — Ambulatory Visit (INDEPENDENT_AMBULATORY_CARE_PROVIDER_SITE_OTHER): Payer: 59 | Admitting: Internal Medicine

## 2021-07-26 DIAGNOSIS — A7740 Ehrlichiosis, unspecified: Secondary | ICD-10-CM | POA: Diagnosis not present

## 2021-07-26 DIAGNOSIS — R0600 Dyspnea, unspecified: Secondary | ICD-10-CM

## 2021-07-26 DIAGNOSIS — F419 Anxiety disorder, unspecified: Secondary | ICD-10-CM | POA: Diagnosis not present

## 2021-07-26 DIAGNOSIS — R0609 Other forms of dyspnea: Secondary | ICD-10-CM

## 2021-07-26 DIAGNOSIS — R29818 Other symptoms and signs involving the nervous system: Secondary | ICD-10-CM | POA: Diagnosis not present

## 2021-07-26 MED ORDER — FLUOXETINE HCL 10 MG PO CAPS: 10.0000 mg | ORAL_CAPSULE | Freq: Every day | ORAL | 3 refills | Status: DC

## 2021-07-26 MED ORDER — HYDROXYZINE HCL 10 MG PO TABS: 10.0000 mg | ORAL_TABLET | Freq: Three times a day (TID) | ORAL | 0 refills | Status: DC | PRN

## 2021-07-26 NOTE — Progress Notes (Signed)
Virtual Visit via Telephone Note   This visit type was conducted due to national recommendations for restrictions regarding the COVID-19 Pandemic (e.g. social distancing) in an effort to limit this patient's exposure and mitigate transmission in our community.  Due to his co-morbid illnesses, this patient is at least at moderate risk for complications without adequate follow up.  This format is felt to be most appropriate for this patient at this time.  The patient did not have access to video technology/had technical difficulties with video requiring transitioning to audio format only (telephone).  All issues noted in this document were discussed and addressed.  No physical exam could be performed with this format.  Evaluation Performed:  Follow-up visit  Date:  07/26/2021   ID:  Elijah Shaffer, DOB Oct 12, 1981, MRN [id number]  Patient Location: Home Provider Location: Office/Clinic  Participants: Patient Location of Patient: Home Location of Provider: Telehealth Consent was obtain for visit to be over via telehealth. I verified that I am speaking with the correct person using two identifiers.  PCP:  Lindell Spar, MD   Chief Complaint:  Fatigue, dyspnea  History of Present Illness:    Elijah Shaffer is a 40 y.o. male who has a televisit for complaint of fatigue, dyspnea, nausea, and anxiety.  He was recently prescribed a second round of doxycycline for ehrlichiosis and was referred to ID for second opinion.  ID specialist told him to stop taking doxycycline, but he states that he feels better when he takes doxycycline and he starts having similar symptoms once he stops it.  He has reported exposure to tick bites multiple times in the past.  Denies noticing any particular rash currently.  He has had pulmonology evaluation for dyspnea, for which he is going to get PFTs.  The patient does not have symptoms concerning for COVID-19 infection (fever, chills, cough, or new shortness of  breath).   Past Medical, Surgical, Social History, Allergies, and Medications have been Reviewed.  Past Medical History:  Diagnosis Date   Acute renal injury ([place]) 06/28/2017   Anxiety    Aspiration pneumonia of right upper lobe due to gastric secretions ([place])    Diverticulitis    GERD (gastroesophageal reflux disease) 01/08/2018   Seasonal allergies    Syncope 06/26/2021   Past Surgical History:  Procedure Laterality Date   BIOPSY  03/18/2021   Procedure: BIOPSY;  Surgeon: Harvel Quale, MD;  [facility];  Service: Gastroenterology;;  esophageal at [facility]   BIOPSY  05/23/2021   Procedure: BIOPSY;  Surgeon: Harvel Quale, MD;  [facility];  Service: Gastroenterology;;   COLONOSCOPY N/A 09/22/2017   Procedure: COLONOSCOPY;  Surgeon: Rogene Houston, MD;  [facility];  Service: Endoscopy;  Laterality: N/A;  9:55   COLONOSCOPY WITH PROPOFOL N/A 05/23/2021   Procedure: COLONOSCOPY WITH PROPOFOL;  Surgeon: Harvel Quale, MD;  [facility];  Service: Gastroenterology;  Laterality: N/A;  7:30   ESOPHAGOGASTRODUODENOSCOPY (EGD) WITH PROPOFOL N/A 03/18/2021   Procedure: ESOPHAGOGASTRODUODENOSCOPY (EGD) WITH PROPOFOL;  Surgeon: Harvel Quale, MD;  [facility];  Service: Gastroenterology;  Laterality: N/A;   ESOPHAGOGASTRODUODENOSCOPY (EGD) WITH PROPOFOL N/A 05/23/2021   Procedure: ESOPHAGOGASTRODUODENOSCOPY (EGD) WITH PROPOFOL;  Surgeon: Harvel Quale, MD;  [facility];  Service: Gastroenterology;  Laterality: N/A;   POLYPECTOMY  09/22/2017   Procedure: POLYPECTOMY;  Surgeon: Rogene Houston, MD;  [facility];  Service: Endoscopy;;  Current Meds  Medication Sig   acetaminophen (TYLENOL) 500 MG tablet Take 500 mg by mouth every 6 (six) hours as needed.   albuterol (VENTOLIN HFA) 108 (90 Base) MCG/ACT inhaler Inhale 2 puffs into the lungs every 6 (six) hours  as needed for shortness of breath.   b complex vitamins capsule Take 1 capsule by mouth daily. With D3 gummie   betamethasone dipropionate 0.05 % cream Apply topically 2 (two) times daily as needed.   cetirizine (ZYRTEC) 10 MG tablet Take 10 mg by mouth daily.   docusate sodium (COLACE) 100 MG capsule Take 100 mg by mouth daily.   doxycycline (VIBRA-TABS) 100 MG tablet Take 1 tablet (100 mg total) by mouth 2 (two) times daily.   FLUoxetine (PROZAC) 10 MG capsule Take 1 capsule (10 mg total) by mouth daily.   fluticasone (FLONASE ALLERGY RELIEF) 50 MCG/ACT nasal spray Place 2 sprays into both nostrils daily. (Patient taking differently: Place 2 sprays into both nostrils daily as needed for allergies or rhinitis.)   guaiFENesin (MUCINEX) 600 MG 12 hr tablet Take 600 mg by mouth 2 (two) times daily as needed for to loosen phlegm.   meclizine (ANTIVERT) 12.5 MG tablet meclizine 12.5 mg tablet  TAKE 1 TABLET BY MOUTH THREE TIMES DAILY FOR DIZZINESS   Multiple Vitamins-Minerals (MULTIVITAMIN WITH MINERALS) tablet Take 1 tablet by mouth daily. Patch   omega-3 acid ethyl esters (LOVAZA) 1 g capsule Take 1 g by mouth daily.   ondansetron (ZOFRAN) 4 MG tablet Take 1 tablet (4 mg total) by mouth every 6 (six) hours as needed for nausea.   pantoprazole (PROTONIX) 40 MG tablet Take 1 tablet (40 mg total) by mouth 2 (two) times daily.   rosuvastatin (CRESTOR) 20 MG tablet Take 1 tablet (20 mg total) by mouth daily.   [DISCONTINUED] hydrOXYzine (ATARAX/VISTARIL) 10 MG tablet Take 1 tablet (10 mg total) by mouth 3 (three) times daily as needed for anxiety.     Allergies:   Pollen extract and Claritin [loratadine]   ROS:   Please see the history of present illness.     All other systems reviewed and are negative.   Labs/Other Tests and Data Reviewed:    Recent Labs: 03/17/2021: B Natriuretic Peptide 17.0 03/20/2021: Magnesium 2.0 07/18/2021: ALT 48; BUN 12; Creatinine, Ser 1.12; Hemoglobin 17.3; Platelets  235; Potassium 4.4; Sodium 138; TSH 1.440   Recent Lipid Panel Lab Results  Component Value Date/Time   CHOL 230 (H) 07/18/2021 08:31 AM   TRIG 838 (HH) 07/18/2021 08:31 AM   HDL 34 (L) 07/18/2021 08:31 AM   CHOLHDL 6.8 (H) 07/18/2021 08:31 AM   CHOLHDL 3.6 02/24/2021 05:25 AM   LDLCALC Comment (A) 07/18/2021 08:31 AM    Wt Readings from Last 3 Encounters:  07/24/21 247 lb (112 kg)  07/24/21 247 lb 9.6 oz (112.3 kg)  07/24/21 246 lb 9.6 oz (111.9 kg)     ASSESSMENT & PLAN:    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  Anxiety Multisystem complaints leading to extensive work-up -but no clear etiology to suggest his symptoms currently. Could be related to illness anxiety disorder -trial of SSRI Started Prozac 10 mg daily Hydroxyzine as needed for anxiety  Dyspnea on exertion Fatigue and dyspnea on exertion Could be a component of asthma and/or anxiety Albuterol PRN Planned to have PFTs  Focal neurological deficit Likely has Bell's palsy, [facility] note  reviewed Follows up with Dr. Merlene Laughter for EEG   Time:   Today, I have spent 19 minutes reviewing the chart, including problem list, medications, and with the patient with telehealth technology discussing the above problems.   Medication Adjustments/Labs and Tests Ordered: Current medicines are reviewed at length with the patient today.  Concerns regarding medicines are outlined above.   Tests Ordered: No orders of the defined types were placed in this encounter.   Medication Changes: Meds ordered this encounter  Medications   FLUoxetine (PROZAC) 10 MG capsule    Sig: Take 1 capsule (10 mg total) by mouth daily.    Dispense:  30 capsule    Refill:  3   hydrOXYzine (ATARAX/VISTARIL) 10 MG tablet    Sig: Take 1 tablet (10 mg total) by mouth 3 (three) times daily as needed for anxiety.    Dispense:  30 tablet     Refill:  0     Note: This dictation was prepared with Dragon dictation along with smaller phrase technology. Similar sounding words can be transcribed inadequately or may not be corrected upon review. Any transcriptional errors that result from this process are unintentional.      Disposition:  Follow up  Signed, Lindell Spar, MD  07/26/2021 4:35 PM     [place]

## 2021-07-26 NOTE — Patient Instructions (Signed)
Please start taking Prozac as prescribed.  Please take Hydroxyzine as needed for anxiety.

## 2021-07-26 NOTE — Assessment & Plan Note (Addendum)
Fatigue and dyspnea on exertion Could be a component of asthma and/or anxiety Albuterol PRN Planned to have PFTs

## 2021-07-26 NOTE — Assessment & Plan Note (Signed)
IgG positive in ER On second round of doxycycline, would avoid refilling it as his symptoms are less likely from this.

## 2021-07-26 NOTE — Assessment & Plan Note (Addendum)
Likely has Bell's palsy, [facility] note reviewed Follows up with Dr. Merlene Laughter for EEG

## 2021-07-26 NOTE — Assessment & Plan Note (Signed)
Multisystem complaints leading to extensive work-up -but no clear etiology to suggest his symptoms currently. Could be related to illness anxiety disorder -trial of SSRI Started Prozac 10 mg daily Hydroxyzine as needed for anxiety

## 2021-07-28 LAB — EHRLICHIA ANTIBODY PANEL
E. CHAFFEENSIS AB IGG: <1:64 {titer}
E. CHAFFEENSIS AB IGM: <1:20 {titer}

## 2021-07-31 ENCOUNTER — Telehealth: Payer: Self-pay

## 2021-07-31 NOTE — Telephone Encounter (Signed)
Patient returned my call. Patient aware of results and to follow up with his PCP as Dr.Comer recommended.

## 2021-07-31 NOTE — Telephone Encounter (Signed)
Left VM informing patient since he is active on my chart I would send him a message. Patient made aware of results via my chart.  [place], CMA

## 2021-07-31 NOTE — Telephone Encounter (Signed)
-----   Message from Thayer Headings, MD sent at 07/31/2021 10:51 AM EDT ----- This is the patient btw ----- Message ----- From: Thayer Headings, MD Sent: 07/31/2021  10:51 AM EDT To: [facility] Triage Nurse Pool  Please let him know that his ehrlichia test was negative for ehrlichia so he did not have this.  Previous test a false positive.   thanks

## 2021-08-03 ENCOUNTER — Ambulatory Visit: Payer: 59 | Admitting: Internal Medicine

## 2021-08-07 ENCOUNTER — Ambulatory Visit: Payer: 59 | Admitting: Internal Medicine

## 2021-09-26 ENCOUNTER — Ambulatory Visit (INDEPENDENT_AMBULATORY_CARE_PROVIDER_SITE_OTHER): Payer: 59 | Admitting: Internal Medicine

## 2021-09-26 ENCOUNTER — Other Ambulatory Visit: Payer: Self-pay

## 2021-09-26 ENCOUNTER — Encounter: Payer: Self-pay | Admitting: Internal Medicine

## 2021-09-26 DIAGNOSIS — E782 Mixed hyperlipidemia: Secondary | ICD-10-CM

## 2021-09-26 DIAGNOSIS — R7401 Elevation of levels of liver transaminase levels: Secondary | ICD-10-CM

## 2021-09-26 DIAGNOSIS — J0111 Acute recurrent frontal sinusitis: Secondary | ICD-10-CM

## 2021-09-26 MED ORDER — AMOXICILLIN-POT CLAVULANATE 875-125 MG PO TABS: 1.0000 | ORAL_TABLET | Freq: Two times a day (BID) | ORAL | 0 refills | Status: DC

## 2021-09-26 MED ORDER — FLUTICASONE PROPIONATE 50 MCG/ACT NA SUSP: 2.0000 | Freq: Every day | NASAL | 2 refills | Status: DC | PRN

## 2021-09-26 NOTE — Progress Notes (Signed)
Virtual Visit via Telephone Note   This visit type was conducted due to national recommendations for restrictions regarding the COVID-19 Pandemic (e.g. social distancing) in an effort to limit this patient's exposure and mitigate transmission in our community.  Due to his co-morbid illnesses, this patient is at least at moderate risk for complications without adequate follow up.  This format is felt to be most appropriate for this patient at this time.  The patient did not have access to video technology/had technical difficulties with video requiring transitioning to audio format only (telephone).  All issues noted in this document were discussed and addressed.  No physical exam could be performed with this format.  Evaluation Performed:  Follow-up visit  Date:  09/26/2021   ID:  Elijah Shaffer, DOB 1981/03/07, MRN [id number]  Patient Location: Home Provider Location: Office/Clinic  Participants: Patient Location of Patient: Home Location of Provider: Telehealth Consent was obtain for visit to be over via telehealth. I verified that I am speaking with the correct person using two identifiers.  PCP:  Lindell Spar, MD   Chief Complaint: Cough, fever, nasal congestion and headache  History of Present Illness:    Elijah Shaffer is a 40 y.o. male who has a televisit for complaint of cough, fever, nasal congestion and sinus pressure related headache for last 8 days.  His fever has resolved now.  He has tried NyQuil with minimal relief.  He continues to take Zyrtec for allergies.  Denies any dyspnea or wheezing currently.  He is LDL and TG where very high in 07/2021, and has started taking Crestor now.  The patient does not have symptoms concerning for COVID-19 infection (fever, chills, cough, or new shortness of breath).   Past Medical, Surgical, Social History, Allergies, and Medications have been Reviewed.  Past Medical History:  Diagnosis Date   Acute renal injury ([place])  06/28/2017   Anxiety    Aspiration pneumonia of right upper lobe due to gastric secretions ([place])    Diverticulitis    GERD (gastroesophageal reflux disease) 01/08/2018   Seasonal allergies    Syncope 06/26/2021   Past Surgical History:  Procedure Laterality Date   BIOPSY  03/18/2021   Procedure: BIOPSY;  Surgeon: Harvel Quale, MD;  [facility];  Service: Gastroenterology;;  esophageal at [facility]   BIOPSY  05/23/2021   Procedure: BIOPSY;  Surgeon: Harvel Quale, MD;  [facility];  Service: Gastroenterology;;   COLONOSCOPY N/A 09/22/2017   Procedure: COLONOSCOPY;  Surgeon: Rogene Houston, MD;  [facility];  Service: Endoscopy;  Laterality: N/A;  9:55   COLONOSCOPY WITH PROPOFOL N/A 05/23/2021   Procedure: COLONOSCOPY WITH PROPOFOL;  Surgeon: Harvel Quale, MD;  [facility];  Service: Gastroenterology;  Laterality: N/A;  7:30   ESOPHAGOGASTRODUODENOSCOPY (EGD) WITH PROPOFOL N/A 03/18/2021   Procedure: ESOPHAGOGASTRODUODENOSCOPY (EGD) WITH PROPOFOL;  Surgeon: Harvel Quale, MD;  [facility];  Service: Gastroenterology;  Laterality: N/A;   ESOPHAGOGASTRODUODENOSCOPY (EGD) WITH PROPOFOL N/A 05/23/2021   Procedure: ESOPHAGOGASTRODUODENOSCOPY (EGD) WITH PROPOFOL;  Surgeon: Harvel Quale, MD;  [facility];  Service: Gastroenterology;  Laterality: N/A;   POLYPECTOMY  09/22/2017   Procedure: POLYPECTOMY;  Surgeon: Rogene Houston, MD;  [facility];  Service: Endoscopy;;     Current Meds  Medication Sig   acetaminophen (TYLENOL) 500 MG tablet Take 500 mg by mouth every 6 (six) hours as needed.  albuterol (VENTOLIN HFA) 108 (90 Base) MCG/ACT inhaler Inhale 2 puffs into the lungs every 6 (six) hours as needed for shortness of breath.   amoxicillin-clavulanate (AUGMENTIN) 875-125 MG tablet Take 1 tablet by mouth 2 (two) times daily.   b complex vitamins capsule  Take 1 capsule by mouth daily. With D3 gummie   betamethasone dipropionate 0.05 % cream Apply topically 2 (two) times daily as needed.   cetirizine (ZYRTEC) 10 MG tablet Take 10 mg by mouth daily.   docusate sodium (COLACE) 100 MG capsule Take 100 mg by mouth daily.   FLUoxetine (PROZAC) 10 MG capsule Take 1 capsule (10 mg total) by mouth daily.   guaiFENesin (MUCINEX) 600 MG 12 hr tablet Take 600 mg by mouth 2 (two) times daily as needed for to loosen phlegm.   hydrOXYzine (ATARAX/VISTARIL) 10 MG tablet Take 1 tablet (10 mg total) by mouth 3 (three) times daily as needed for anxiety.   meclizine (ANTIVERT) 12.5 MG tablet meclizine 12.5 mg tablet  TAKE 1 TABLET BY MOUTH THREE TIMES DAILY FOR DIZZINESS   Multiple Vitamins-Minerals (MULTIVITAMIN WITH MINERALS) tablet Take 1 tablet by mouth daily. Patch   omega-3 acid ethyl esters (LOVAZA) 1 g capsule Take 1 g by mouth daily.   ondansetron (ZOFRAN) 4 MG tablet Take 1 tablet (4 mg total) by mouth every 6 (six) hours as needed for nausea.   rosuvastatin (CRESTOR) 20 MG tablet Take 1 tablet (20 mg total) by mouth daily.   [DISCONTINUED] doxycycline (VIBRA-TABS) 100 MG tablet Take 1 tablet (100 mg total) by mouth 2 (two) times daily.   [DISCONTINUED] fluticasone (FLONASE ALLERGY RELIEF) 50 MCG/ACT nasal spray Place 2 sprays into both nostrils daily. (Patient taking differently: Place 2 sprays into both nostrils daily as needed for allergies or rhinitis.)     Allergies:   Pollen extract and Claritin [loratadine]   ROS:   Please see the history of present illness.     All other systems reviewed and are negative.   Labs/Other Tests and Data Reviewed:    Recent Labs: 03/17/2021: B Natriuretic Peptide 17.0 03/20/2021: Magnesium 2.0 07/18/2021: ALT 48; BUN 12; Creatinine, Ser 1.12; Hemoglobin 17.3; Platelets 235; Potassium 4.4; Sodium 138; TSH 1.440   Recent Lipid Panel Lab Results  Component Value Date/Time   CHOL 230 (H) 07/18/2021 08:31 AM    TRIG 838 (HH) 07/18/2021 08:31 AM   HDL 34 (L) 07/18/2021 08:31 AM   CHOLHDL 6.8 (H) 07/18/2021 08:31 AM   CHOLHDL 3.6 02/24/2021 05:25 AM   LDLCALC Comment (A) 07/18/2021 08:31 AM    Wt Readings from Last 3 Encounters:  07/24/21 247 lb (112 kg)  07/24/21 247 lb 9.6 oz (112.3 kg)  07/24/21 246 lb 9.6 oz (111.9 kg)     ASSESSMENT & PLAN:    Acute sinusitis Started Augmentin as he has persistent symptoms despite symptomatic treatment with NyQuil Flonase for allergies Continue Zyrtec for allergies Can use vaporizer for nasal congestion  HLD Lipid profile reviewed Has started taking Crestor Check lipid profile in 2 weeks  Transaminemia Mild elevation of ALT in last CMP Will recheck CMP  Time:   Today, I have spent 11 minutes reviewing the chart, including problem list, medications, and with the patient with telehealth technology discussing the above problems.   Medication Adjustments/Labs and Tests Ordered: Current medicines are reviewed at length with the patient today.  Concerns regarding medicines are outlined above.   Tests Ordered: Orders Placed This Encounter  Procedures   Lipid panel  CMP14+EGFR    Medication Changes: Meds ordered this encounter  Medications   fluticasone (FLONASE ALLERGY RELIEF) 50 MCG/ACT nasal spray    Sig: Place 2 sprays into both nostrils daily as needed for allergies or rhinitis.    Dispense:  9.9 mL    Refill:  2   amoxicillin-clavulanate (AUGMENTIN) 875-125 MG tablet    Sig: Take 1 tablet by mouth 2 (two) times daily.    Dispense:  14 tablet    Refill:  0     Note: This dictation was prepared with Dragon dictation along with smaller phrase technology. Similar sounding words can be transcribed inadequately or may not be corrected upon review. Any transcriptional errors that result from this process are unintentional.      Disposition:  Follow up  Signed, Lindell Spar, MD  09/26/2021 9:26 AM     [place]

## 2021-09-27 NOTE — Progress Notes (Signed)
Synopsis: Referred in September 2022 for Cough and shortness of breath  Subjective:   PATIENT ID: Elijah Shaffer GENDER: male DOB: 04/01/1981, MRN: [id number]   HPI  Chief Complaint  Patient presents with   Follow-up    F/U on SOB. Has not completed the PFT yet. Still having issues with DOE. Productive cough with yellowish phlegm.    Elijah Shaffer is a 40 year old male, never smoker with history of GERD, hiatal hernia and seasonal allergies who returns to pulmonary clinic for cough/shortness of breath.   He continues to experience exertional dyspnea. He was trialed on as needed albuterol with some improvement but continues to experience significant dyspnea with exertion. For example, he was short of breath walking around [employer] this past week. He denies wheezing. He does have cough with greenish-yellowish sputum.   Nasal congestion is improved with fluticasone nasal spray. 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   He was unable to have PFTs done since last visit as his mother was recently diagnosed with cervical cancer and they were occupied with her treatment schedule.   He denies leg swelling, chest pain or syncopal episodes.  He raised the question with his previous work exposures using round-up and other herbicide chemicals could lead to his breathing issues.  OV 07/24/21 He reports shortness of breath over the last 4 months.  The shortness of breath is evident with exertion and resolves with rest.  Patient has been treated for tickborne illness with concern for Ehrlichia and has been on doxycycline.  He is being evaluated by infectious disease today.  He has been much more inactive over these last 4 months.  He works as a Development worker, international aid and has not been working as much due to the shortness of breath.  He denies any wheezing or chest tightness.  He has intermittent dry cough that is occasionally productive with sputum.  He is being treated with PPI therapy for GERD and has a small  hiatal hernia on CT imaging.  He does report seasonal allergies with nasal and sinus congestion more so in the spring and fall.  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  He denies any history of blood clots.  No family history of blood clots.  No family history of lung disease.  No history of smoking or vaping.  He does report some intermittent secondhand smoke exposure via social gatherings.  He did grow up in a home with a wood stove.  Past Medical History:  Diagnosis Date   Acute renal injury ([place]) 06/28/2017   Anxiety    Aspiration pneumonia of right upper lobe due to gastric secretions ([facility])    Diverticulitis    GERD (gastroesophageal reflux disease) 01/08/2018   Seasonal allergies    Syncope 06/26/2021     Family History  Problem Relation Age of Onset   Colon cancer Maternal Grandmother    Colon cancer Maternal Grandfather    Healthy Mother    Healthy Father      Social History   Socioeconomic History   Marital status: Single    Spouse name: Not on file   Number of children: Not on file   Years of education: Not on file   Highest education level: Not on file  Occupational History   Not on file  Tobacco Use   Smoking status: Never   Smokeless tobacco: Never  Vaping Use   Vaping Use: Never used  Substance and Sexual Activity  Alcohol use: Yes    Comment: occasional   Drug use: No   Sexual activity: Not on file  Other Topics Concern   Not on file  Social History Narrative   Not on file   Social Determinants of Health   Financial Resource Strain: Not on file  Food Insecurity: Not on file  Transportation Needs: Not on file  Physical Activity: Not on file  Stress: Not on file  Social Connections: Not on file  Intimate Partner Violence: Not on file     Allergies  Allergen Reactions   Pollen Extract    Claritin [Loratadine] Other (See Comments)    Heart race     Outpatient Medications Prior to Visit  Medication Sig Dispense Refill    acetaminophen (TYLENOL) 500 MG tablet Take 500 mg by mouth every 6 (six) hours as needed.     albuterol (VENTOLIN HFA) 108 (90 Base) MCG/ACT inhaler Inhale 2 puffs into the lungs every 6 (six) hours as needed for shortness of breath. 8 g 0   amoxicillin-clavulanate (AUGMENTIN) 875-125 MG tablet Take 1 tablet by mouth 2 (two) times daily. 14 tablet 0   b complex vitamins capsule Take 1 capsule by mouth daily. With D3 gummie     betamethasone dipropionate 0.05 % cream Apply topically 2 (two) times daily as needed.     cetirizine (ZYRTEC) 10 MG tablet Take 10 mg by mouth daily.     docusate sodium (COLACE) 100 MG capsule Take 100 mg by mouth daily.     FLUoxetine (PROZAC) 10 MG capsule Take 1 capsule (10 mg total) by mouth daily. 30 capsule 3   fluticasone (FLONASE ALLERGY RELIEF) 50 MCG/ACT nasal spray Place 2 sprays into both nostrils daily as needed for allergies or rhinitis. 9.9 mL 2   guaiFENesin (MUCINEX) 600 MG 12 hr tablet Take 600 mg by mouth 2 (two) times daily as needed for to loosen phlegm.     hydrOXYzine (ATARAX/VISTARIL) 10 MG tablet Take 1 tablet (10 mg total) by mouth 3 (three) times daily as needed for anxiety. 30 tablet 0   meclizine (ANTIVERT) 12.5 MG tablet meclizine 12.5 mg tablet  TAKE 1 TABLET BY MOUTH THREE TIMES DAILY FOR DIZZINESS     Multiple Vitamins-Minerals (MULTIVITAMIN WITH MINERALS) tablet Take 1 tablet by mouth daily. Patch     omega-3 acid ethyl esters (LOVAZA) 1 g capsule Take 1 g by mouth daily.     ondansetron (ZOFRAN) 4 MG tablet Take 1 tablet (4 mg total) by mouth every 6 (six) hours as needed for nausea. 20 tablet 0   rosuvastatin (CRESTOR) 20 MG tablet Take 1 tablet (20 mg total) by mouth daily. 90 tablet 1   pantoprazole (PROTONIX) 40 MG tablet Take 1 tablet (40 mg total) by mouth 2 (two) times daily. 180 tablet 3   No facility-administered medications prior to visit.   Review of Systems  Constitutional:  Positive for malaise/fatigue. Negative for  chills, fever and weight loss.  HENT:  Negative for congestion, sinus pain and sore throat.   Eyes: Negative.   Respiratory:  Positive for cough and shortness of breath. Negative for hemoptysis, sputum production and wheezing.   Cardiovascular:  Negative for chest pain, palpitations, orthopnea, claudication and leg swelling.  Gastrointestinal:  Positive for heartburn. Negative for abdominal pain, nausea and vomiting.  Genitourinary: Negative.   Musculoskeletal:  Negative for joint pain and myalgias.  Skin:  Negative for rash.  Neurological:  Negative for weakness.  Endo/Heme/Allergies: Negative.  Psychiatric/Behavioral: Negative.     Objective:   Vitals:   09/28/21 0921  BP: 118/72  Pulse: 77  SpO2: 98%  Weight: 243 lb 6.4 oz (110.4 kg)  Height: 6\' 3"  (1.905 m)    Physical Exam Constitutional:      General: He is not in acute distress. HENT:     Head: Normocephalic and atraumatic.  Eyes:     Extraocular Movements: Extraocular movements intact.     Conjunctiva/sclera: Conjunctivae normal.     Pupils: Pupils are equal, round, and reactive to light.  Cardiovascular:     Rate and Rhythm: Normal rate and regular rhythm.     Pulses: Normal pulses.     Heart sounds: Normal heart sounds. No murmur heard. Pulmonary:     Effort: Pulmonary effort is normal.     Breath sounds: No decreased breath sounds, wheezing, rhonchi or rales.  Abdominal:     General: Bowel sounds are normal.     Palpations: Abdomen is soft.  Musculoskeletal:     Right lower leg: No edema.     Left lower leg: No edema.  Skin:    General: Skin is warm and dry.  Neurological:     General: No focal deficit present.     Mental Status: He is alert.   CBC    Component Value Date/Time   WBC 7.4 07/18/2021 0831   WBC 13.0 (H) 06/03/2021 1744   RBC 5.71 07/18/2021 0831   RBC 5.38 06/03/2021 1744   HGB 17.3 07/18/2021 0831   HCT 50.4 07/18/2021 0831   PLT 235 07/18/2021 0831   MCV 88 07/18/2021 0831    MCH 30.3 07/18/2021 0831   MCH 31.2 06/03/2021 1744   MCHC 34.3 07/18/2021 0831   MCHC 34.4 06/03/2021 1744   RDW 13.0 07/18/2021 0831   LYMPHSABS 2.7 07/18/2021 0831   MONOABS 0.8 06/03/2021 1744   EOSABS 0.1 07/18/2021 0831   BASOSABS 0.1 07/18/2021 0831   BMP Latest Ref Rng & Units 07/18/2021 06/03/2021 03/20/2021  Glucose 65 - 99 mg/dL 94 106(H) 96  BUN 6 - 24 mg/dL 12 9 10   Creatinine 0.76 - 1.27 mg/dL 1.12 1.31(H) 1.22  BUN/Creat Ratio 9 - 20 11 - -  Sodium 134 - 144 mmol/L 138 136 137  Potassium 3.5 - 5.2 mmol/L 4.4 4.0 3.9  Chloride 96 - 106 mmol/L 101 102 102  CO2 20 - 29 mmol/L 22 26 26   Calcium 8.7 - 10.2 mg/dL 10.2 9.5 9.3   Chest imaging: CXR 07/19/21 Mild scarring of right lateral base. Small to moderate hiatal hernia.  Increased retrosternal airspace  PFT: No flowsheet data found.  Echo 02/24/21: LV EF 60-65%. Normal diastolic parameters. RV systolic function is normal. RV is mildly enlarged.   Assessment & Plan:   Shortness of breath - Plan: D-Dimer, Quantitative, D-Dimer, Quantitative, Pulmonary function test  Mild persistent reactive airway disease without complication - Plan: fluticasone-salmeterol (ADVAIR DISKUS) 250-50 MCG/ACT AEPB, montelukast (SINGULAIR) 10 MG tablet, Pulmonary function test  Fatigue, unspecified type - Plan: Home sleep test  Abnormal echocardiogram - Plan: Home sleep test  Discussion: Usiel Astarita is a 40 year old male, never smoker with history of GERD, hiatal hernia and seasonal allergies who returns to pulmonary clinic for cough/shortness of breath.   Patient's shortness of breath may be related to underlying obstructive lung disease vs pulmonary hypertension vs deconditioning.  He does have increased retrosternal airspace on chest radiograph concerning for obstructive lung disease.  No significant peripheral  eosinophilia on lab review. No significant smoking history although he does have previous occupational exposures to herbicides  like round up.   Patient's echo does show mildly enlarged RV so we will check sleep study and d-dimer for any etiologies that could be leading to pulmonary hypertension. We will also obtain pulmonary function tests to evaluate for obstructive lung disease or findings or pulmonary hypertension. He has varicose veins as risk factor for DVT.   Start advair diskus 250-70mcg 1 puff twice daily and continue to use albuterol as needed. Will monitor for any symptom improvement.   Continue fluticasone nasal spray 2 sprays per nostril daily.   Start montelukast 10mg  daily.  Follow-up in 1 month via video visit.  Freda Jackson, MD [facility] Office: [phone]   Current Outpatient Medications:    acetaminophen (TYLENOL) 500 MG tablet, Take 500 mg by mouth every 6 (six) hours as needed., Disp: , Rfl:    albuterol (VENTOLIN HFA) 108 (90 Base) MCG/ACT inhaler, Inhale 2 puffs into the lungs every 6 (six) hours as needed for shortness of breath., Disp: 8 g, Rfl: 0   amoxicillin-clavulanate (AUGMENTIN) 875-125 MG tablet, Take 1 tablet by mouth 2 (two) times daily., Disp: 14 tablet, Rfl: 0   b complex vitamins capsule, Take 1 capsule by mouth daily. With D3 gummie, Disp: , Rfl:    betamethasone dipropionate 0.05 % cream, Apply topically 2 (two) times daily as needed., Disp: , Rfl:    cetirizine (ZYRTEC) 10 MG tablet, Take 10 mg by mouth daily., Disp: , Rfl:    docusate sodium (COLACE) 100 MG capsule, Take 100 mg by mouth daily., Disp: , Rfl:    FLUoxetine (PROZAC) 10 MG capsule, Take 1 capsule (10 mg total) by mouth daily., Disp: 30 capsule, Rfl: 3   fluticasone (FLONASE ALLERGY RELIEF) 50 MCG/ACT nasal spray, Place 2 sprays into both nostrils daily as needed for allergies or rhinitis., Disp: 9.9 mL, Rfl: 2   fluticasone-salmeterol (ADVAIR DISKUS) 250-50 MCG/ACT AEPB, Inhale 1 puff into the lungs in the morning and at bedtime., Disp: 60 each, Rfl: 6   guaiFENesin (MUCINEX) 600 MG  12 hr tablet, Take 600 mg by mouth 2 (two) times daily as needed for to loosen phlegm., Disp: , Rfl:    hydrOXYzine (ATARAX/VISTARIL) 10 MG tablet, Take 1 tablet (10 mg total) by mouth 3 (three) times daily as needed for anxiety., Disp: 30 tablet, Rfl: 0   meclizine (ANTIVERT) 12.5 MG tablet, meclizine 12.5 mg tablet  TAKE 1 TABLET BY MOUTH THREE TIMES DAILY FOR DIZZINESS, Disp: , Rfl:    montelukast (SINGULAIR) 10 MG tablet, Take 1 tablet (10 mg total) by mouth at bedtime., Disp: 30 tablet, Rfl: 11   Multiple Vitamins-Minerals (MULTIVITAMIN WITH MINERALS) tablet, Take 1 tablet by mouth daily. Patch, Disp: , Rfl:    omega-3 acid ethyl esters (LOVAZA) 1 g capsule, Take 1 g by mouth daily., Disp: , Rfl:    ondansetron (ZOFRAN) 4 MG tablet, Take 1 tablet (4 mg total) by mouth every 6 (six) hours as needed for nausea., Disp: 20 tablet, Rfl: 0   rosuvastatin (CRESTOR) 20 MG tablet, Take 1 tablet (20 mg total) by mouth daily., Disp: 90 tablet, Rfl: 1   pantoprazole (PROTONIX) 40 MG tablet, Take 1 tablet (40 mg total) by mouth 2 (two) times daily., Disp: 180 tablet, Rfl: 3

## 2021-09-28 ENCOUNTER — Ambulatory Visit (INDEPENDENT_AMBULATORY_CARE_PROVIDER_SITE_OTHER): Payer: 59 | Admitting: Pulmonary Disease

## 2021-09-28 ENCOUNTER — Other Ambulatory Visit: Payer: Self-pay

## 2021-09-28 ENCOUNTER — Telehealth: Payer: Self-pay | Admitting: Pulmonary Disease

## 2021-09-28 ENCOUNTER — Encounter: Payer: Self-pay | Admitting: Pulmonary Disease

## 2021-09-28 VITALS — BP 118/72 | HR 77 | Ht 75.0 in | Wt 243.4 lb

## 2021-09-28 DIAGNOSIS — R931 Abnormal findings on diagnostic imaging of heart and coronary circulation: Secondary | ICD-10-CM

## 2021-09-28 DIAGNOSIS — R5383 Other fatigue: Secondary | ICD-10-CM | POA: Diagnosis not present

## 2021-09-28 DIAGNOSIS — R0602 Shortness of breath: Secondary | ICD-10-CM | POA: Diagnosis not present

## 2021-09-28 DIAGNOSIS — J4530 Mild persistent asthma, uncomplicated: Secondary | ICD-10-CM | POA: Diagnosis not present

## 2021-09-28 DIAGNOSIS — R0609 Other forms of dyspnea: Secondary | ICD-10-CM

## 2021-09-28 LAB — D-DIMER, QUANTITATIVE: D-Dimer, Quant: 0.34 mcg/mL FEU (ref <0.50)

## 2021-09-28 MED ORDER — FLUTICASONE-SALMETEROL 250-50 MCG/ACT IN AEPB: 1.0000 | INHALATION_SPRAY | Freq: Two times a day (BID) | RESPIRATORY_TRACT | 6 refills | Status: DC

## 2021-09-28 MED ORDER — MONTELUKAST SODIUM 10 MG PO TABS: 10.0000 mg | ORAL_TABLET | Freq: Every day | ORAL | 11 refills | Status: DC

## 2021-09-28 NOTE — Patient Instructions (Addendum)
We will schedule you for pulmonary function tests at [facility] in the coming weeks.  We will schedule you for home sleep study through the [facility].   We will check a d-dimer lab today, and if elevated will order a CT Chest scan at [facility]  Start advair diskus 250-90mcg 1 puff twice daily - rinse mouth out after each use  Continue to use albuterol inhaler as needed  Continue to use fluticasone nasal spray daily  Start montelukast 10mg  daily for allergies

## 2021-10-01 NOTE — Telephone Encounter (Signed)
I called and spoke with the pt  He was seen here 09/28/21  He states since the last visit he has been feeling very tired and somewhat achy "all over" He wakes up in the morning feeling like his limbs are heavy or feels like "I worked out all night"  He is sleeping okay  He states he is unsure if his breathing is worse or if he has any other symptoms but that "probably is worse"  He denies cough, fevers, chills He states he is using the singulair, advair and albuterol  He has 1 day left to taking augmentin that his PCP gave 09/26/21  Please advise if you have any recommendations for him, thanks!  Allergies  Allergen Reactions   Pollen Extract    Claritin [Loratadine] Other (See Comments)    Heart race

## 2021-10-01 NOTE — Telephone Encounter (Signed)
Elijah Spatz, NP to Me     4:36 PM  These symptoms sound like he needs follow up with his PCP. If we have availability tomorrow , and there is a provider available he needs an appointment with a CXR. Otherwise he should follow up with PCP or seek emergency care if his symptoms get wors  I spoke with the pt and scheduled for appt with Dr Erin Fulling tomorrow at 9:45 with cxr first. Cxr order was placed.

## 2021-10-02 ENCOUNTER — Encounter: Payer: Self-pay | Admitting: Pulmonary Disease

## 2021-10-02 ENCOUNTER — Ambulatory Visit (INDEPENDENT_AMBULATORY_CARE_PROVIDER_SITE_OTHER): Payer: 59

## 2021-10-02 ENCOUNTER — Ambulatory Visit (INDEPENDENT_AMBULATORY_CARE_PROVIDER_SITE_OTHER): Payer: 59 | Admitting: Pulmonary Disease

## 2021-10-02 ENCOUNTER — Other Ambulatory Visit: Payer: Self-pay

## 2021-10-02 ENCOUNTER — Telehealth: Payer: Self-pay | Admitting: Pulmonary Disease

## 2021-10-02 VITALS — BP 130/88 | HR 72 | Temp 98.4°F | Ht 75.0 in | Wt 245.0 lb

## 2021-10-02 DIAGNOSIS — R5383 Other fatigue: Secondary | ICD-10-CM

## 2021-10-02 DIAGNOSIS — R0609 Other forms of dyspnea: Secondary | ICD-10-CM | POA: Diagnosis not present

## 2021-10-02 DIAGNOSIS — R0602 Shortness of breath: Secondary | ICD-10-CM | POA: Diagnosis not present

## 2021-10-02 DIAGNOSIS — R531 Weakness: Secondary | ICD-10-CM

## 2021-10-02 DIAGNOSIS — R9389 Abnormal findings on diagnostic imaging of other specified body structures: Secondary | ICD-10-CM | POA: Diagnosis not present

## 2021-10-02 LAB — COMPREHENSIVE METABOLIC PANEL
ALT: 37 U/L (ref 0–53)
AST: 28 U/L (ref 0–37)
Albumin: 5.1 g/dL (ref 3.5–5.2)
Alkaline Phosphatase: 66 U/L (ref 39–117)
BUN: 12 mg/dL (ref 6–23)
CO2: 27 mEq/L (ref 19–32)
Calcium: 9.9 mg/dL (ref 8.4–10.5)
Chloride: 105 mEq/L (ref 96–112)
Creatinine, Ser: 1.31 mg/dL (ref 0.40–1.50)
GFR: 68.05 mL/min (ref >60.00)
Glucose, Bld: 97 mg/dL (ref 70–99)
Potassium: 4 mEq/L (ref 3.5–5.1)
Sodium: 141 mEq/L (ref 135–145)
Total Bilirubin: 1 mg/dL (ref 0.2–1.2)
Total Protein: 7.7 g/dL (ref 6.0–8.3)

## 2021-10-02 LAB — CBC WITH DIFFERENTIAL/PLATELET
Basophils Absolute: 0 10*3/uL (ref 0.0–0.1)
Basophils Relative: 0.6 % (ref 0.0–3.0)
Eosinophils Absolute: 0 10*3/uL (ref 0.0–0.7)
Eosinophils Relative: 0.2 % (ref 0.0–5.0)
HCT: 47.2 % (ref 39.0–52.0)
Hemoglobin: 15.9 g/dL (ref 13.0–17.0)
Lymphocytes Relative: 27.3 % (ref 12.0–46.0)
Lymphs Abs: 2.1 10*3/uL (ref 0.7–4.0)
MCHC: 33.6 g/dL (ref 30.0–36.0)
MCV: 87 fl (ref 78.0–100.0)
Monocytes Absolute: 0.5 10*3/uL (ref 0.1–1.0)
Monocytes Relative: 6.4 % (ref 3.0–12.0)
Neutro Abs: 5.1 10*3/uL (ref 1.4–7.7)
Neutrophils Relative %: 65.5 % (ref 43.0–77.0)
Platelets: 210 10*3/uL (ref 150.0–400.0)
RBC: 5.43 Mil/uL (ref 4.22–5.81)
RDW: 13.2 % (ref 11.5–15.5)
WBC: 7.7 10*3/uL (ref 4.0–10.5)

## 2021-10-02 LAB — SEDIMENTATION RATE: Sed Rate: 2 mm/hr (ref 0–15)

## 2021-10-02 LAB — C-REACTIVE PROTEIN: CRP: <1 mg/dL (ref 0.5–20.0)

## 2021-10-02 NOTE — Progress Notes (Signed)
Synopsis: Referred in September 2022 for Cough and shortness of breath  Subjective:   PATIENT ID: Elijah Shaffer GENDER: male DOB: 02-09-1981, MRN: [id number]  HPI  Chief Complaint  Patient presents with   Follow-up    Patient says he feels real tired and feels sluggish. Has had a lot of shortness of breath lately.   Elijah Shaffer is a 40 year old male, never smoker with history of GERD, hiatal hernia and seasonal allergies who returns to pulmonary clinic for acute visit.   He called in after being seen on 11/18 complaining of similar complaints as discussed in the office visit that day. He complains of more fatigue than normal and shortness of breath with exertion. He has felt lightheaded with exertion before. He feels very run down. He denies any fevers or chills. His appetite remains good.  OV 09/28/21 He continues to experience exertional dyspnea. He was trialed on as needed albuterol with some improvement but continues to experience significant dyspnea with exertion. For example, he was short of breath walking around [employer] this past week. He denies wheezing. He does have cough with greenish-yellowish sputum.   Nasal congestion is improved with fluticasone nasal spray. 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   He was unable to have PFTs done since last visit as his mother was recently diagnosed with cervical cancer and they were occupied with her treatment schedule.   He denies leg swelling, chest pain or syncopal episodes.  He raised the question with his previous work exposures using round-up and other herbicide chemicals could lead to his breathing issues.  OV 07/24/21 He reports shortness of breath over the last 4 months.  The shortness of breath is evident with exertion and resolves with rest.  Patient has been treated for tickborne illness with concern for Ehrlichia and has been on doxycycline.  He is being evaluated by infectious disease today.  He has been much more  inactive over these last 4 months.  He works as a Development worker, international aid and has not been working as much due to the shortness of breath.  He denies any wheezing or chest tightness.  He has intermittent dry cough that is occasionally productive with sputum.  He is being treated with PPI therapy for GERD and has a small hiatal hernia on CT imaging.  He does report seasonal allergies with nasal and sinus congestion more so in the spring and fall.  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  He denies any history of blood clots.  No family history of blood clots.  No family history of lung disease.  No history of smoking or vaping.  He does report some intermittent secondhand smoke exposure via social gatherings.  He did grow up in a home with a wood stove.  Past Medical History:  Diagnosis Date   Acute renal injury ([place]) 06/28/2017   Anxiety    Aspiration pneumonia of right upper lobe due to gastric secretions (HCC)    Diverticulitis    GERD (gastroesophageal reflux disease) 01/08/2018   Seasonal allergies    Syncope 06/26/2021     Family History  Problem Relation Age of Onset   Colon cancer Maternal Grandmother    Colon cancer Maternal Grandfather    Healthy Mother    Healthy Father      Social History   Socioeconomic History   Marital status: Single    Spouse name: Not on file   Number of children: Not  on file   Years of education: Not on file   Highest education level: Not on file  Occupational History   Not on file  Tobacco Use   Smoking status: Never   Smokeless tobacco: Never  Vaping Use   Vaping Use: Never used  Substance and Sexual Activity   Alcohol use: Yes    Comment: occasional   Drug use: No   Sexual activity: Not on file  Other Topics Concern   Not on file  Social History Narrative   Not on file   Social Determinants of Health   Financial Resource Strain: Not on file  Food Insecurity: Not on file  Transportation Needs: Not on file  Physical  Activity: Not on file  Stress: Not on file  Social Connections: Not on file  Intimate Partner Violence: Not on file     Allergies  Allergen Reactions   Pollen Extract    Claritin [Loratadine] Other (See Comments)    Heart race     Outpatient Medications Prior to Visit  Medication Sig Dispense Refill   acetaminophen (TYLENOL) 500 MG tablet Take 500 mg by mouth every 6 (six) hours as needed.     albuterol (VENTOLIN HFA) 108 (90 Base) MCG/ACT inhaler Inhale 2 puffs into the lungs every 6 (six) hours as needed for shortness of breath. 8 g 0   amoxicillin-clavulanate (AUGMENTIN) 875-125 MG tablet Take 1 tablet by mouth 2 (two) times daily. 14 tablet 0   b complex vitamins capsule Take 1 capsule by mouth daily. With D3 gummie     betamethasone dipropionate 0.05 % cream Apply topically 2 (two) times daily as needed.     cetirizine (ZYRTEC) 10 MG tablet Take 10 mg by mouth daily.     docusate sodium (COLACE) 100 MG capsule Take 100 mg by mouth daily.     FLUoxetine (PROZAC) 10 MG capsule Take 1 capsule (10 mg total) by mouth daily. 30 capsule 3   fluticasone (FLONASE ALLERGY RELIEF) 50 MCG/ACT nasal spray Place 2 sprays into both nostrils daily as needed for allergies or rhinitis. 9.9 mL 2   fluticasone-salmeterol (ADVAIR DISKUS) 250-50 MCG/ACT AEPB Inhale 1 puff into the lungs in the morning and at bedtime. 60 each 6   guaiFENesin (MUCINEX) 600 MG 12 hr tablet Take 600 mg by mouth 2 (two) times daily as needed for to loosen phlegm.     hydrOXYzine (ATARAX/VISTARIL) 10 MG tablet Take 1 tablet (10 mg total) by mouth 3 (three) times daily as needed for anxiety. 30 tablet 0   meclizine (ANTIVERT) 12.5 MG tablet meclizine 12.5 mg tablet  TAKE 1 TABLET BY MOUTH THREE TIMES DAILY FOR DIZZINESS     montelukast (SINGULAIR) 10 MG tablet Take 1 tablet (10 mg total) by mouth at bedtime. 30 tablet 11   Multiple Vitamins-Minerals (MULTIVITAMIN WITH MINERALS) tablet Take 1 tablet by mouth daily. Patch      omega-3 acid ethyl esters (LOVAZA) 1 g capsule Take 1 g by mouth daily.     ondansetron (ZOFRAN) 4 MG tablet Take 1 tablet (4 mg total) by mouth every 6 (six) hours as needed for nausea. 20 tablet 0   rosuvastatin (CRESTOR) 20 MG tablet Take 1 tablet (20 mg total) by mouth daily. 90 tablet 1   pantoprazole (PROTONIX) 40 MG tablet Take 1 tablet (40 mg total) by mouth 2 (two) times daily. 180 tablet 3   No facility-administered medications prior to visit.   Review of Systems  Constitutional:  Positive  for malaise/fatigue. Negative for chills, fever and weight loss.  HENT:  Negative for congestion, sinus pain and sore throat.   Eyes: Negative.   Respiratory:  Positive for cough and shortness of breath. Negative for hemoptysis, sputum production and wheezing.   Cardiovascular:  Positive for chest pain. Negative for palpitations, orthopnea, claudication and leg swelling.  Gastrointestinal:  Positive for abdominal pain and heartburn. Negative for nausea and vomiting.  Genitourinary: Negative.   Musculoskeletal:  Negative for joint pain and myalgias.  Skin:  Negative for rash.  Neurological:  Negative for weakness.  Endo/Heme/Allergies: Negative.   Psychiatric/Behavioral: Negative.     Objective:   Vitals:   10/02/21 0937  BP: 130/88  Pulse: 72  Temp: 98.4 F (36.9 C)  TempSrc: Oral  SpO2: 98%  Weight: 245 lb (111.1 kg)  Height: $Remove'6\' 3"'[id number]$  (1.905 m)   Physical Exam Constitutional:      General: He is not in acute distress.    Appearance: He is not ill-appearing or toxic-appearing.  HENT:     Head: Normocephalic and atraumatic.  Eyes:     Extraocular Movements: Extraocular movements intact.     Conjunctiva/sclera: Conjunctivae normal.     Pupils: Pupils are equal, round, and reactive to light.  Cardiovascular:     Rate and Rhythm: Normal rate and regular rhythm.     Pulses: Normal pulses.     Heart sounds: Normal heart sounds. No murmur heard. Pulmonary:     Effort: Pulmonary  effort is normal.     Breath sounds: No decreased breath sounds, wheezing, rhonchi or rales.  Abdominal:     General: Bowel sounds are normal.     Palpations: Abdomen is soft.  Musculoskeletal:     Right lower leg: No edema.     Left lower leg: No edema.  Skin:    General: Skin is warm and dry.  Neurological:     General: No focal deficit present.     Mental Status: He is alert.   CBC    Component Value Date/Time   WBC 7.7 10/02/2021 1017   RBC 5.43 10/02/2021 1017   HGB 15.9 10/02/2021 1017   HGB 17.3 07/18/2021 0831   HCT 47.2 10/02/2021 1017   HCT 50.4 07/18/2021 0831   PLT 210.0 10/02/2021 1017   PLT 235 07/18/2021 0831   MCV 87.0 10/02/2021 1017   MCV 88 07/18/2021 0831   MCH 30.3 07/18/2021 0831   MCH 31.2 06/03/2021 1744   MCHC 33.6 10/02/2021 1017   RDW 13.2 10/02/2021 1017   RDW 13.0 07/18/2021 0831   LYMPHSABS 2.1 10/02/2021 1017   LYMPHSABS 2.7 07/18/2021 0831   MONOABS 0.5 10/02/2021 1017   EOSABS 0.0 10/02/2021 1017   EOSABS 0.1 07/18/2021 0831   BASOSABS 0.0 10/02/2021 1017   BASOSABS 0.1 07/18/2021 0831   BMP Latest Ref Rng & Units 10/02/2021 07/18/2021 06/03/2021  Glucose 70 - 99 mg/dL 97 94 106(H)  BUN 6 - 23 mg/dL $Remove'12 12 9  'VelfyxW$ Creatinine 0.40 - 1.50 mg/dL 1.31 1.12 1.31(H)  BUN/Creat Ratio 9 - 20 - 11 -  Sodium 135 - 145 mEq/L 141 138 136  Potassium 3.5 - 5.1 mEq/L 4.0 4.4 4.0  Chloride 96 - 112 mEq/L 105 101 102  CO2 19 - 32 mEq/L $Remove'27 22 26  'bFopLZJ$ Calcium 8.4 - 10.5 mg/dL 9.9 10.2 9.5   Chest imaging: CXR 10/03/21 Increased interstitial prominence bilaterally with medial right upper lobe opacity along the mediastinal border.   CXR 07/19/21 Mild  scarring of right lateral base. Small to moderate hiatal hernia.  Increased retrosternal airspace  PFT: No flowsheet data found.  Echo 02/24/21: LV EF 60-65%. Normal diastolic parameters. RV systolic function is normal. RV is mildly enlarged.   Assessment & Plan:   Fatigue, unspecified type - Plan: CT Chest  High Resolution, Aldolase, ANA, ANCA screen with reflex titer, Anti-DNA antibody, double-stranded, Anti-Smith antibody, Anti-scleroderma antibody, C-reactive protein, Cyclic citrul peptide antibody, IgG, Hypersensitivity Pneumonitis, Rheumatoid factor, RNP Antibodies, Sedimentation rate, Sjogren's syndrome antibods(ssa + ssb), CK total and CKMB (cardiac)not at [facility], MyoMarker 3 Plus Profile (RDL), CBC with Differential, Comp Met (CMET), Comp Met (CMET), CBC with Differential, MyoMarker 3 Plus Profile (RDL), CK total and CKMB (cardiac)not at [facility], Sjogren's syndrome antibods(ssa + ssb), Sedimentation rate, RNP Antibodies, Rheumatoid factor, Hypersensitivity Pneumonitis, Cyclic citrul peptide antibody, IgG, C-reactive protein, Anti-scleroderma antibody, Anti-Smith antibody, Anti-DNA antibody, double-stranded, ANCA screen with reflex titer, ANA, Aldolase  Generalized weakness - Plan: Aldolase, ANA, ANCA screen with reflex titer, Anti-DNA antibody, double-stranded, Anti-Smith antibody, Anti-scleroderma antibody, C-reactive protein, Cyclic citrul peptide antibody, IgG, Hypersensitivity Pneumonitis, Rheumatoid factor, RNP Antibodies, Sedimentation rate, Sjogren's syndrome antibods(ssa + ssb), CK total and CKMB (cardiac)not at [facility], MyoMarker 3 Plus Profile (RDL), CBC with Differential, Comp Met (CMET), Comp Met (CMET), CBC with Differential, MyoMarker 3 Plus Profile (RDL), CK total and CKMB (cardiac)not at [facility], Sjogren's syndrome antibods(ssa + ssb), Sedimentation rate, RNP Antibodies, Rheumatoid factor, Hypersensitivity Pneumonitis, Cyclic citrul peptide antibody, IgG, C-reactive protein, Anti-scleroderma antibody, Anti-Smith antibody, Anti-DNA antibody, double-stranded, ANCA screen with reflex titer, ANA, Aldolase  Abnormal chest x-ray - Plan: Aldolase, ANA, ANCA screen with reflex titer, Anti-DNA antibody, double-stranded, Anti-Smith antibody, Anti-scleroderma antibody, C-reactive protein, Cyclic citrul peptide  antibody, IgG, Hypersensitivity Pneumonitis, Rheumatoid factor, RNP Antibodies, Sedimentation rate, Sjogren's syndrome antibods(ssa + ssb), CK total and CKMB (cardiac)not at [facility], MyoMarker 3 Plus Profile (RDL), MyoMarker 3 Plus Profile (RDL), CK total and CKMB (cardiac)not at [facility], Sjogren's syndrome antibods(ssa + ssb), Sedimentation rate, RNP Antibodies, Rheumatoid factor, Hypersensitivity Pneumonitis, Cyclic citrul peptide antibody, IgG, C-reactive protein, Anti-scleroderma antibody, Anti-Smith antibody, Anti-DNA antibody, double-stranded, ANCA screen with reflex titer, ANA, Aldolase, Pulmonary Function Test  Shortness of breath - Plan: CT Chest High Resolution, Aldolase, ANA, ANCA screen with reflex titer, Anti-DNA antibody, double-stranded, Anti-Smith antibody, Anti-scleroderma antibody, C-reactive protein, Cyclic citrul peptide antibody, IgG, Hypersensitivity Pneumonitis, Rheumatoid factor, RNP Antibodies, Sedimentation rate, Sjogren's syndrome antibods(ssa + ssb), CK total and CKMB (cardiac)not at [facility], MyoMarker 3 Plus Profile (RDL), MyoMarker 3 Plus Profile (RDL), CK total and CKMB (cardiac)not at [facility], Sjogren's syndrome antibods(ssa + ssb), Sedimentation rate, RNP Antibodies, Rheumatoid factor, Hypersensitivity Pneumonitis, Cyclic citrul peptide antibody, IgG, C-reactive protein, Anti-scleroderma antibody, Anti-Smith antibody, Anti-DNA antibody, double-stranded, ANCA screen with reflex titer, ANA, Aldolase, Pulmonary Function Test  Discussion: Elijah Shaffer is a 40 year old male, never smoker with history of GERD, hiatal hernia and seasonal allergies who returns to pulmonary clinic for acute visit.   Chest radiograph today does show increased interstitial prominance along with his symptoms of fatigue, aches and dyspnea on exertion remain with out a know etiology at this time.   Differential includes interstitial lung disease/autoimmune condition vs atypical infection.   We will check a HRCT  chest scan for his abnormal chest x-ray for further evaluation.   We will order inflammatory lab workup today. D-dimer from last visit was low so less concern for DVT/PE.   He is to continue advair and montelukast.   We will follow up with patient  once testing has resulted.  Freda Jackson, MD [facility] Office: [phone]   Current Outpatient Medications:    acetaminophen (TYLENOL) 500 MG tablet, Take 500 mg by mouth every 6 (six) hours as needed., Disp: , Rfl:    albuterol (VENTOLIN HFA) 108 (90 Base) MCG/ACT inhaler, Inhale 2 puffs into the lungs every 6 (six) hours as needed for shortness of breath., Disp: 8 g, Rfl: 0   amoxicillin-clavulanate (AUGMENTIN) 875-125 MG tablet, Take 1 tablet by mouth 2 (two) times daily., Disp: 14 tablet, Rfl: 0   b complex vitamins capsule, Take 1 capsule by mouth daily. With D3 gummie, Disp: , Rfl:    betamethasone dipropionate 0.05 % cream, Apply topically 2 (two) times daily as needed., Disp: , Rfl:    cetirizine (ZYRTEC) 10 MG tablet, Take 10 mg by mouth daily., Disp: , Rfl:    docusate sodium (COLACE) 100 MG capsule, Take 100 mg by mouth daily., Disp: , Rfl:    FLUoxetine (PROZAC) 10 MG capsule, Take 1 capsule (10 mg total) by mouth daily., Disp: 30 capsule, Rfl: 3   fluticasone (FLONASE ALLERGY RELIEF) 50 MCG/ACT nasal spray, Place 2 sprays into both nostrils daily as needed for allergies or rhinitis., Disp: 9.9 mL, Rfl: 2   fluticasone-salmeterol (ADVAIR DISKUS) 250-50 MCG/ACT AEPB, Inhale 1 puff into the lungs in the morning and at bedtime., Disp: 60 each, Rfl: 6   guaiFENesin (MUCINEX) 600 MG 12 hr tablet, Take 600 mg by mouth 2 (two) times daily as needed for to loosen phlegm., Disp: , Rfl:    hydrOXYzine (ATARAX/VISTARIL) 10 MG tablet, Take 1 tablet (10 mg total) by mouth 3 (three) times daily as needed for anxiety., Disp: 30 tablet, Rfl: 0   meclizine (ANTIVERT) 12.5 MG tablet, meclizine 12.5 mg tablet  TAKE 1 TABLET  BY MOUTH THREE TIMES DAILY FOR DIZZINESS, Disp: , Rfl:    montelukast (SINGULAIR) 10 MG tablet, Take 1 tablet (10 mg total) by mouth at bedtime., Disp: 30 tablet, Rfl: 11   Multiple Vitamins-Minerals (MULTIVITAMIN WITH MINERALS) tablet, Take 1 tablet by mouth daily. Patch, Disp: , Rfl:    omega-3 acid ethyl esters (LOVAZA) 1 g capsule, Take 1 g by mouth daily., Disp: , Rfl:    ondansetron (ZOFRAN) 4 MG tablet, Take 1 tablet (4 mg total) by mouth every 6 (six) hours as needed for nausea., Disp: 20 tablet, Rfl: 0   rosuvastatin (CRESTOR) 20 MG tablet, Take 1 tablet (20 mg total) by mouth daily., Disp: 90 tablet, Rfl: 1   pantoprazole (PROTONIX) 40 MG tablet, Take 1 tablet (40 mg total) by mouth 2 (two) times daily., Disp: 180 tablet, Rfl: 3

## 2021-10-02 NOTE — Patient Instructions (Signed)
Your chest x-ray today is abnormal concerning for infiltrates.  We will check a CT Chest scan today if possible.  We will try to move up your pulmonary function test appointment if possible.   Continue of the advair inhaler twice daily  We will check labs today to evaluate for inflammatory conditions.

## 2021-10-03 ENCOUNTER — Encounter: Payer: Self-pay | Admitting: Pulmonary Disease

## 2021-10-03 LAB — ALDOLASE: Aldolase: 6 U/L (ref <=8.1)

## 2021-10-05 LAB — CYCLIC CITRUL PEPTIDE ANTIBODY, IGG: Cyclic Citrullin Peptide Ab: <16 UNITS

## 2021-10-05 LAB — RHEUMATOID FACTOR: Rheumatoid fact SerPl-aCnc: <14 IU/mL (ref <14)

## 2021-10-05 LAB — ANCA SCREEN W REFLEX TITER: ANCA Screen: NEGATIVE

## 2021-10-05 LAB — CK TOTAL AND CKMB (NOT AT ARMC)
CK, MB: 1.1 ng/mL (ref 0–5.0)
Relative Index: 0.3 (ref 0–4.0)
Total CK: 356 U/L — ABNORMAL HIGH (ref 44–196)

## 2021-10-05 LAB — SJOGREN'S SYNDROME ANTIBODS(SSA + SSB)
SSA (Ro) (ENA) Antibody, IgG: <1 AI
SSB (La) (ENA) Antibody, IgG: <1 AI

## 2021-10-05 LAB — ANTI-SMITH ANTIBODY: ENA SM Ab Ser-aCnc: <1 AI

## 2021-10-05 LAB — ANTI-SCLERODERMA ANTIBODY: Scleroderma (Scl-70) (ENA) Antibody, IgG: <1 AI

## 2021-10-05 LAB — ANTI-DNA ANTIBODY, DOUBLE-STRANDED: ds DNA Ab: <1 IU/mL

## 2021-10-05 LAB — ANA: Anti Nuclear Antibody (ANA): NEGATIVE

## 2021-10-08 ENCOUNTER — Ambulatory Visit (HOSPITAL_COMMUNITY): Payer: 59

## 2021-10-08 NOTE — Telephone Encounter (Signed)
This has been authorized.  Elijah Shaffer is scheduling.

## 2021-10-10 ENCOUNTER — Ambulatory Visit (HOSPITAL_COMMUNITY): Payer: 59

## 2021-10-10 ENCOUNTER — Ambulatory Visit (HOSPITAL_COMMUNITY): Admission: RE | Admit: 2021-10-10 | Payer: 59 | Source: Ambulatory Visit

## 2021-10-11 ENCOUNTER — Ambulatory Visit (HOSPITAL_COMMUNITY): Admission: RE | Admit: 2021-10-11 | Payer: 59 | Source: Ambulatory Visit

## 2021-10-11 LAB — HYPERSENSITIVITY PNEUMONITIS
A. Pullulans Abs: NEGATIVE
A.Fumigatus #1 Abs: NEGATIVE
Micropolyspora faeni, IgG: NEGATIVE
Pigeon Serum Abs: NEGATIVE
Thermoact. Saccharii: NEGATIVE
Thermoactinomyces vulgaris, IgG: NEGATIVE

## 2021-10-11 LAB — RNP ANTIBODIES: ENA RNP Ab: 1.4 AI — ABNORMAL HIGH (ref 0.0–0.9)

## 2021-10-17 ENCOUNTER — Other Ambulatory Visit: Payer: Self-pay

## 2021-10-17 ENCOUNTER — Ambulatory Visit (HOSPITAL_COMMUNITY)
Admission: RE | Admit: 2021-10-17 | Discharge: 2021-10-17 | Disposition: A | Discharge status: Home / Self Care | Payer: 59 | Source: Ambulatory Visit | Attending: Pulmonary Disease | Admitting: Pulmonary Disease

## 2021-10-17 DIAGNOSIS — R0602 Shortness of breath: Secondary | ICD-10-CM | POA: Diagnosis present

## 2021-10-17 DIAGNOSIS — R5383 Other fatigue: Secondary | ICD-10-CM | POA: Diagnosis not present

## 2021-10-17 LAB — MYOMARKER 3 PLUS PROFILE (RDL)

## 2021-10-18 ENCOUNTER — Telehealth: Payer: Self-pay | Admitting: Pulmonary Disease

## 2021-10-18 DIAGNOSIS — R748 Abnormal levels of other serum enzymes: Secondary | ICD-10-CM

## 2021-10-18 NOTE — Telephone Encounter (Signed)
Please let patient know that his CT Chest scan does not show signs of interstitial lung disease. It does show a large hiatal hernia, which means part of his stomach is coming up into his chest which can lead to increased heart burn.   His labs show elevated CK levels which is a muscle enzyme. This could be elevated due to the rosuvastatin, but his RNP levels are elevated which could be concerning for a mixed connective tissue disorder.   Please placed referral to rheumatology for further evaluation of concern for mixed connective tissue disorder.  Thanks, Wille Glaser

## 2021-10-23 NOTE — Telephone Encounter (Signed)
Called patient but he did not answer. Left message for him to call back.  

## 2021-10-24 NOTE — Telephone Encounter (Signed)
Called and spoke with patient. He verbalized understanding. Will go ahead and place the referral for rheumatology.   Nothing further needed at time of call.

## 2021-10-24 NOTE — Telephone Encounter (Signed)
Patient is returning phone call. Patient phone number is [phone].

## 2021-10-26 ENCOUNTER — Other Ambulatory Visit: Payer: Self-pay

## 2021-10-26 ENCOUNTER — Other Ambulatory Visit (HOSPITAL_COMMUNITY)
Admission: RE | Admit: 2021-10-26 | Discharge: 2021-10-26 | Disposition: A | Discharge status: Home / Self Care | Payer: 59 | Source: Ambulatory Visit | Attending: Pulmonary Disease | Admitting: Pulmonary Disease

## 2021-10-26 DIAGNOSIS — Z01812 Encounter for preprocedural laboratory examination: Secondary | ICD-10-CM | POA: Insufficient documentation

## 2021-10-26 DIAGNOSIS — Z20822 Contact with and (suspected) exposure to covid-19: Secondary | ICD-10-CM | POA: Diagnosis not present

## 2021-10-26 DIAGNOSIS — Z01818 Encounter for other preprocedural examination: Secondary | ICD-10-CM

## 2021-10-26 LAB — SARS CORONAVIRUS 2 (TAT 6-24 HRS): SARS Coronavirus 2: NEGATIVE

## 2021-10-29 ENCOUNTER — Telehealth: Payer: Self-pay | Admitting: Pulmonary Disease

## 2021-10-29 NOTE — Telephone Encounter (Signed)
Spoke with pt and reviewed Dr. August Albino recommendations and changed pt's My Chart visit for 10/31/21 to a in person visit at pt request. Nothing further needed at this time.

## 2021-10-29 NOTE — Telephone Encounter (Signed)
I called the patient and he feels that he feels shortness of breath once he is at work and this is only time that he feels he gets short of breath. He is due to have a PFT 10/30/21 and seeing you 10/31/21 to see you.   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 He reports that he is not sure what is causing it but he wants to see if you have any other recommendations. Please advise.

## 2021-10-29 NOTE — Telephone Encounter (Signed)
I have called and LM on VM for the pt to call us back.  

## 2021-10-30 ENCOUNTER — Other Ambulatory Visit: Payer: Self-pay

## 2021-10-30 ENCOUNTER — Ambulatory Visit (HOSPITAL_COMMUNITY)
Admission: RE | Admit: 2021-10-30 | Discharge: 2021-10-30 | Disposition: A | Discharge status: Home / Self Care | Payer: 59 | Source: Ambulatory Visit | Attending: Pulmonary Disease | Admitting: Pulmonary Disease

## 2021-10-30 DIAGNOSIS — J4530 Mild persistent asthma, uncomplicated: Secondary | ICD-10-CM

## 2021-10-30 DIAGNOSIS — R0602 Shortness of breath: Secondary | ICD-10-CM | POA: Diagnosis present

## 2021-10-30 LAB — PULMONARY FUNCTION TEST
DL/VA % pred: 88 %
DL/VA: 4.04 ml/min/mmHg/L
DLCO cor % pred: 64 %
DLCO cor: 23.19 ml/min/mmHg
DLCO unc % pred: 66 %
DLCO unc: 24 ml/min/mmHg
FEF 25-75 Post: 5.14 L/sec
FEF 25-75 Pre: 6.27 L/sec
FEF2575-%Change-Post: -17 %
FEF2575-%Pred-Post: 115 %
FEF2575-%Pred-Pre: 140 %
FEV1-%Change-Post: -1 %
FEV1-%Pred-Post: 100 %
FEV1-%Pred-Pre: 101 %
FEV1-Post: 4.88 L
FEV1-Pre: 4.97 L
FEV1FVC-%Change-Post: -6 %
FEV1FVC-%Pred-Pre: 110 %
FEV6-%Change-Post: 9 %
FEV6-%Pred-Post: 98 %
FEV6-%Pred-Pre: 90 %
FEV6-Post: 5.94 L
FEV6-Pre: 5.44 L
FEV6FVC-%Pred-Post: 102 %
FEV6FVC-%Pred-Pre: 102 %
FVC-%Change-Post: 5 %
FVC-%Pred-Post: 96 %
FVC-%Pred-Pre: 91 %
FVC-Post: 5.94 L
FVC-Pre: 5.65 L
Post FEV1/FVC ratio: 82 %
Post FEV6/FVC ratio: 100 %
Pre FEV1/FVC ratio: 88 %
Pre FEV6/FVC Ratio: 100 %
RV % pred: 55 %
RV: 1.16 L
TLC % pred: 91 %
TLC: 7.27 L

## 2021-10-30 MED ORDER — ALBUTEROL SULFATE (2.5 MG/3ML) 0.083% IN NEBU
2.5000 mg | INHALATION_SOLUTION | Freq: Once | RESPIRATORY_TRACT | Status: AC
  Administered 2021-10-30: 11:00:00 2.5 mg via RESPIRATORY_TRACT

## 2021-10-31 ENCOUNTER — Ambulatory Visit (INDEPENDENT_AMBULATORY_CARE_PROVIDER_SITE_OTHER): Payer: 59 | Admitting: Pulmonary Disease

## 2021-10-31 ENCOUNTER — Encounter: Payer: Self-pay | Admitting: Pulmonary Disease

## 2021-10-31 VITALS — BP 140/80 | HR 93 | Ht 75.0 in | Wt 250.0 lb

## 2021-10-31 DIAGNOSIS — R5383 Other fatigue: Secondary | ICD-10-CM

## 2021-10-31 DIAGNOSIS — R0609 Other forms of dyspnea: Secondary | ICD-10-CM | POA: Diagnosis not present

## 2021-10-31 DIAGNOSIS — I517 Cardiomegaly: Secondary | ICD-10-CM | POA: Diagnosis not present

## 2021-10-31 DIAGNOSIS — R748 Abnormal levels of other serum enzymes: Secondary | ICD-10-CM

## 2021-10-31 DIAGNOSIS — R942 Abnormal results of pulmonary function studies: Secondary | ICD-10-CM

## 2021-10-31 NOTE — Progress Notes (Signed)
Synopsis: Referred in September 2022 for Cough and shortness of breath  Subjective:   PATIENT ID: Elijah Shaffer GENDER: male DOB: Mar 26, 1981, MRN: [id number]  HPI  Chief Complaint  Patient presents with   Follow-up    4wk f/u after PFT yesterday. States his SOB has increased over the last week, especially with exertion.    Elijah Shaffer is a 40 year old male, never smoker with history of GERD, large hiatal hernia and seasonal allergies who returns to pulmonary clinic for follow up of dyspnea and fatigue.   HRCT chest does not indicate ILD involvement. There is a large hiatal hernia noted. Pulmonary function tests show mild diffusion defect with normal spirometry and normal TLC. It was difficult to get consistent data during the pulmonary function testing as the patient was getting fatigued.  Echo from 02/24/21 showed mildly dilated RV with normal function.   CK level is mildly elevated at 356 and ENA RNP ab is elevated. Remaining inflammatory workup is unremarkable which included myositis panel, ANA, CCP, dsDNA, RF, ESR, CRP and ddimer. Hypersensitivity pneumonitis panel is negative.  He continues to have exertional dyspnea and easily fatigued with exertion. He describes a pre-syncopal event when working recently. He continues to work as a Administrator.   He reports a small raised dry patch of skin on left lower extremity, lateral side and similar rash on right lateral ankle that is itchy. He also reports feeling pain in his legs when laying down at night, even with out a hard days work.    OV 10/02/21 He called in after being seen on 11/18 complaining of similar complaints as discussed in the office visit that day. He complains of more fatigue than normal and shortness of breath with exertion. He has felt lightheaded with exertion before. He feels very run down. He denies any fevers or chills. His appetite remains good.  OV 09/28/21 He continues to experience exertional dyspnea. He was  trialed on as needed albuterol with some improvement but continues to experience significant dyspnea with exertion. For example, he was short of breath walking around [employer] this past week. He denies wheezing. He does have cough with greenish-yellowish sputum.   Nasal congestion is improved with fluticasone nasal spray. He is in leaf season now as a Administrator.   He was unable to have PFTs done since last visit as his mother was recently diagnosed with cervical cancer and they were occupied with her treatment schedule.   He denies leg swelling, chest pain or syncopal episodes.  He raised the question with his previous work exposures using round-up and other herbicide chemicals could lead to his breathing issues.  OV 07/24/21 He reports shortness of breath over the last 4 months.  The shortness of breath is evident with exertion and resolves with rest.  Patient has been treated for tickborne illness with concern for Ehrlichia and has been on doxycycline.  He is being evaluated by infectious disease today.  He has been much more inactive over these last 4 months.  He works as a Administrator and has not been working as much due to the shortness of breath.  He denies any wheezing or chest tightness.  He has intermittent dry cough that is occasionally productive with sputum.  He is being treated with PPI therapy for GERD and has a small hiatal hernia on CT imaging.  He does report seasonal allergies with nasal and sinus congestion more so in the spring and fall.  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  He denies any history of blood clots.  No family history of blood clots.  No family history of lung disease.  No history of smoking or vaping.  He does report some intermittent secondhand smoke exposure via social gatherings.  He did grow up in a home with a wood stove.  Past Medical History:  Diagnosis Date   Acute renal injury ([place]) 06/28/2017   Anxiety    Aspiration pneumonia of  right upper lobe due to gastric secretions ([facility])    Diverticulitis    GERD (gastroesophageal reflux disease) 01/08/2018   Seasonal allergies    Syncope 06/26/2021     Family History  Problem Relation Age of Onset   Colon cancer Maternal Grandmother    Colon cancer Maternal Grandfather    Healthy Mother    Healthy Father      Social History   Socioeconomic History   Marital status: Single    Spouse name: Not on file   Number of children: Not on file   Years of education: Not on file   Highest education level: Not on file  Occupational History   Not on file  Tobacco Use   Smoking status: Never   Smokeless tobacco: Never  Vaping Use   Vaping Use: Never used  Substance and Sexual Activity   Alcohol use: Yes    Comment: occasional   Drug use: No   Sexual activity: Not on file  Other Topics Concern   Not on file  Social History Narrative   Not on file   Social Determinants of Health   Financial Resource Strain: Not on file  Food Insecurity: Not on file  Transportation Needs: Not on file  Physical Activity: Not on file  Stress: Not on file  Social Connections: Not on file  Intimate Partner Violence: Not on file     Allergies  Allergen Reactions   Pollen Extract    Claritin [Loratadine] Other (See Comments)    Heart race     Outpatient Medications Prior to Visit  Medication Sig Dispense Refill   acetaminophen (TYLENOL) 500 MG tablet Take 500 mg by mouth every 6 (six) hours as needed.     albuterol (VENTOLIN HFA) 108 (90 Base) MCG/ACT inhaler Inhale 2 puffs into the lungs every 6 (six) hours as needed for shortness of breath. 8 g 0   b complex vitamins capsule Take 1 capsule by mouth daily. With D3 gummie     betamethasone dipropionate 0.05 % cream Apply topically 2 (two) times daily as needed.     cetirizine (ZYRTEC) 10 MG tablet Take 10 mg by mouth daily.     docusate sodium (COLACE) 100 MG capsule Take 100 mg by mouth daily.     FLUoxetine (PROZAC) 10 MG  capsule Take 1 capsule (10 mg total) by mouth daily. 30 capsule 3   fluticasone (FLONASE ALLERGY RELIEF) 50 MCG/ACT nasal spray Place 2 sprays into both nostrils daily as needed for allergies or rhinitis. 9.9 mL 2   fluticasone-salmeterol (ADVAIR DISKUS) 250-50 MCG/ACT AEPB Inhale 1 puff into the lungs in the morning and at bedtime. 60 each 6   guaiFENesin (MUCINEX) 600 MG 12 hr tablet Take 600 mg by mouth 2 (two) times daily as needed for to loosen phlegm.     hydrOXYzine (ATARAX/VISTARIL) 10 MG tablet Take 1 tablet (10 mg total) by mouth 3 (three) times daily as needed for anxiety. 30 tablet 0   meclizine (ANTIVERT) 12.5  MG tablet meclizine 12.5 mg tablet  TAKE 1 TABLET BY MOUTH THREE TIMES DAILY FOR DIZZINESS     montelukast (SINGULAIR) 10 MG tablet Take 1 tablet (10 mg total) by mouth at bedtime. 30 tablet 11   Multiple Vitamins-Minerals (MULTIVITAMIN WITH MINERALS) tablet Take 1 tablet by mouth daily. Patch     omega-3 acid ethyl esters (LOVAZA) 1 g capsule Take 1 g by mouth daily.     ondansetron (ZOFRAN) 4 MG tablet Take 1 tablet (4 mg total) by mouth every 6 (six) hours as needed for nausea. 20 tablet 0   rosuvastatin (CRESTOR) 20 MG tablet Take 1 tablet (20 mg total) by mouth daily. 90 tablet 1   pantoprazole (PROTONIX) 40 MG tablet Take 1 tablet (40 mg total) by mouth 2 (two) times daily. 180 tablet 3   amoxicillin-clavulanate (AUGMENTIN) 875-125 MG tablet Take 1 tablet by mouth 2 (two) times daily. 14 tablet 0   No facility-administered medications prior to visit.   Review of Systems  Constitutional:  Positive for malaise/fatigue. Negative for chills, fever and weight loss.  HENT:  Negative for congestion, sinus pain and sore throat.   Eyes: Negative.   Respiratory:  Positive for shortness of breath. Negative for cough, hemoptysis, sputum production and wheezing.   Cardiovascular:  Positive for chest pain. Negative for palpitations, orthopnea, claudication and leg swelling.   Gastrointestinal:  Positive for heartburn. Negative for abdominal pain, nausea and vomiting.  Genitourinary: Negative.   Musculoskeletal:  Negative for joint pain and myalgias.       Leg pain  Skin:  Negative for rash.  Neurological:  Negative for weakness.  Endo/Heme/Allergies: Negative.   Psychiatric/Behavioral: Negative.     Objective:   Vitals:   10/31/21 0856  BP: 140/80  Pulse: 93  SpO2: 98%  Weight: 250 lb (113.4 kg)  Height: $Remove'6\' 3"'[id number]$  (1.905 m)   Physical Exam Constitutional:      General: He is not in acute distress.    Appearance: He is not ill-appearing or toxic-appearing.  HENT:     Head: Normocephalic and atraumatic.  Eyes:     Extraocular Movements: Extraocular movements intact.     Conjunctiva/sclera: Conjunctivae normal.     Pupils: Pupils are equal, round, and reactive to light.  Cardiovascular:     Rate and Rhythm: Normal rate and regular rhythm.     Pulses: Normal pulses.     Heart sounds: Normal heart sounds. No murmur heard. Pulmonary:     Effort: Pulmonary effort is normal.     Breath sounds: No decreased breath sounds, wheezing, rhonchi or rales.  Abdominal:     General: Bowel sounds are normal.     Palpations: Abdomen is soft.  Musculoskeletal:     Right lower leg: No edema.     Left lower leg: No edema.  Skin:    General: Skin is warm and dry.     Findings: Rash (small, 0.5cm round raised red lesion, somewhat scaley) present.  Neurological:     General: No focal deficit present.     Mental Status: He is alert.   CBC    Component Value Date/Time   WBC 7.7 10/02/2021 1017   RBC 5.43 10/02/2021 1017   HGB 15.9 10/02/2021 1017   HGB 17.3 07/18/2021 0831   HCT 47.2 10/02/2021 1017   HCT 50.4 07/18/2021 0831   PLT 210.0 10/02/2021 1017   PLT 235 07/18/2021 0831   MCV 87.0 10/02/2021 1017   MCV 88 07/18/2021 0831  MCH 30.3 07/18/2021 0831   MCH 31.2 06/03/2021 1744   MCHC 33.6 10/02/2021 1017   RDW 13.2 10/02/2021 1017   RDW 13.0  07/18/2021 0831   LYMPHSABS 2.1 10/02/2021 1017   LYMPHSABS 2.7 07/18/2021 0831   MONOABS 0.5 10/02/2021 1017   EOSABS 0.0 10/02/2021 1017   EOSABS 0.1 07/18/2021 0831   BASOSABS 0.0 10/02/2021 1017   BASOSABS 0.1 07/18/2021 0831   BMP Latest Ref Rng & Units 10/02/2021 07/18/2021 06/03/2021  Glucose 70 - 99 mg/dL 97 94 106(H)  BUN 6 - 23 mg/dL $Remove'12 12 9  '[id number]$ Creatinine 0.40 - 1.50 mg/dL 1.31 1.12 1.31(H)  BUN/Creat Ratio 9 - 20 - 11 -  Sodium 135 - 145 mEq/L 141 138 136  Potassium 3.5 - 5.1 mEq/L 4.0 4.4 4.0  Chloride 96 - 112 mEq/L 105 101 102  CO2 19 - 32 mEq/L $Remove'27 22 26  'oMHzVzm$ Calcium 8.4 - 10.5 mg/dL 9.9 10.2 9.5   Chest imaging: HRCT Chest 10/17/21 1. No findings to suggest interstitial lung disease. No acute findings in the thorax to account for the patient's symptoms. 2. Small pulmonary nodules measuring up to 5 mm in the lateral segment of the right middle lobe, nonspecific, but statistically likely benign. No follow-up needed if patient is low-risk (and has no known or suspected primary neoplasm). Non-contrast chest CT can be considered in 12 months if patient is high-risk.  3. Large hiatal hernia. 4. Mild hepatic steatosis. 5. Colonic diverticulosis  CXR 10/03/21 Increased interstitial prominence bilaterally with medial right upper lobe opacity along the mediastinal border.   CXR 07/19/21 Mild scarring of right lateral base. Small to moderate hiatal hernia.  Increased retrosternal airspace  PFT: PFT Results Latest Ref Rng & Units 10/30/2021  FVC-Pre L 5.65  FVC-Predicted Pre % 91  FVC-Post L 5.94  FVC-Predicted Post % 96  Pre FEV1/FVC % % 88  Post FEV1/FCV % % 82  FEV1-Pre L 4.97  FEV1-Predicted Pre % 101  FEV1-Post L 4.88  DLCO uncorrected ml/min/mmHg 24.00  DLCO UNC% % 66  DLCO corrected ml/min/mmHg 23.19  DLCO COR %Predicted % 64  DLVA Predicted % 88  TLC L 7.27  TLC % Predicted % 91  RV % Predicted % 55  10/30/21: Mild diffusion defect with normal spirometry and  TLC.  Echo 02/24/21: LV EF 60-65%. Normal diastolic parameters. RV systolic function is normal. RV is mildly enlarged.   Assessment & Plan:   Decreased diffusion capacity of lung  Enlarged RV (right ventricle)  Dyspnea on exertion  Fatigue, unspecified type  Elevated CK  Discussion: Elijah Shaffer is a 40 year old male, never smoker with history of GERD, large hiatal hernia and seasonal allergies who returns to pulmonary clinic for follow up of dyspnea and fatigue.   HRCT Chest is reassuring that there is no parenchymal or airway involvement. Pulmonary function tests show mild diffusion defect. TTE from 02/2021 showed mildly enlarged RV. He continues to have exertional dyspnea and intermittent pre-syncopal episodes with exertion. Given his clinical history, PFT and echo findings we will pursue further workup for concern of pulmonary hypertension with right heart catheterization. I have reached out to cardiology for further evaluation.   If his RHC is unrevealing we will check a cardiopulmonary exercise test next.   He has been referred to rheumatology for elevated CK level and elevated ENA RNP antibody possibly concerning for a mixed connective tissue disorder.   He has a home sleep study ordered which we will check on  when that can be scheduled.  We discussed stopping advair and monitoring for any changes in his symptoms as the inhaler therapy does not seem to be providing much benefit.  Schedule follow up for 2 months but we will be in touch with patient once we have RHC results.  Freda Jackson, MD [facility] Office: [phone]   Current Outpatient Medications:    acetaminophen (TYLENOL) 500 MG tablet, Take 500 mg by mouth every 6 (six) hours as needed., Disp: , Rfl:    albuterol (VENTOLIN HFA) 108 (90 Base) MCG/ACT inhaler, Inhale 2 puffs into the lungs every 6 (six) hours as needed for shortness of breath., Disp: 8 g, Rfl: 0   b complex vitamins  capsule, Take 1 capsule by mouth daily. With D3 gummie, Disp: , Rfl:    betamethasone dipropionate 0.05 % cream, Apply topically 2 (two) times daily as needed., Disp: , Rfl:    cetirizine (ZYRTEC) 10 MG tablet, Take 10 mg by mouth daily., Disp: , Rfl:    docusate sodium (COLACE) 100 MG capsule, Take 100 mg by mouth daily., Disp: , Rfl:    FLUoxetine (PROZAC) 10 MG capsule, Take 1 capsule (10 mg total) by mouth daily., Disp: 30 capsule, Rfl: 3   fluticasone (FLONASE ALLERGY RELIEF) 50 MCG/ACT nasal spray, Place 2 sprays into both nostrils daily as needed for allergies or rhinitis., Disp: 9.9 mL, Rfl: 2   fluticasone-salmeterol (ADVAIR DISKUS) 250-50 MCG/ACT AEPB, Inhale 1 puff into the lungs in the morning and at bedtime., Disp: 60 each, Rfl: 6   guaiFENesin (MUCINEX) 600 MG 12 hr tablet, Take 600 mg by mouth 2 (two) times daily as needed for to loosen phlegm., Disp: , Rfl:    hydrOXYzine (ATARAX/VISTARIL) 10 MG tablet, Take 1 tablet (10 mg total) by mouth 3 (three) times daily as needed for anxiety., Disp: 30 tablet, Rfl: 0   meclizine (ANTIVERT) 12.5 MG tablet, meclizine 12.5 mg tablet  TAKE 1 TABLET BY MOUTH THREE TIMES DAILY FOR DIZZINESS, Disp: , Rfl:    montelukast (SINGULAIR) 10 MG tablet, Take 1 tablet (10 mg total) by mouth at bedtime., Disp: 30 tablet, Rfl: 11   Multiple Vitamins-Minerals (MULTIVITAMIN WITH MINERALS) tablet, Take 1 tablet by mouth daily. Patch, Disp: , Rfl:    omega-3 acid ethyl esters (LOVAZA) 1 g capsule, Take 1 g by mouth daily., Disp: , Rfl:    ondansetron (ZOFRAN) 4 MG tablet, Take 1 tablet (4 mg total) by mouth every 6 (six) hours as needed for nausea., Disp: 20 tablet, Rfl: 0   rosuvastatin (CRESTOR) 20 MG tablet, Take 1 tablet (20 mg total) by mouth daily., Disp: 90 tablet, Rfl: 1   pantoprazole (PROTONIX) 40 MG tablet, Take 1 tablet (40 mg total) by mouth 2 (two) times daily., Disp: 180 tablet, Rfl: 3

## 2021-10-31 NOTE — Patient Instructions (Signed)
We will refer you to cardiology for right heart catheterization to check the blood vessel pressures of your heart and lungs.  You have a mild diffusion defect on breathing tests, meaning the oxygen is not traveling through your lung tissue, through the blood vessel walls into the blood cells as easily.   Your CK and ENA RNP ab levels are elevated possibly concerning for a systemic inflammatory condition which rheumatology will help evaluate further.   Try stopping the advair inhaler and monitor for any changes in your symptoms  We will check on the scheduling of your home sleep study.  Happy Holidays!

## 2021-11-02 IMAGING — DX DG CHEST 1V PORT
1 series · 1 of 1 positions shown · non-contrast
Comparison: 04/30/2010

CLINICAL DATA: 40-year-old male with gastric test no hemorrhage.

EXAM:
PORTABLE CHEST - 1 VIEW

[chest ap]
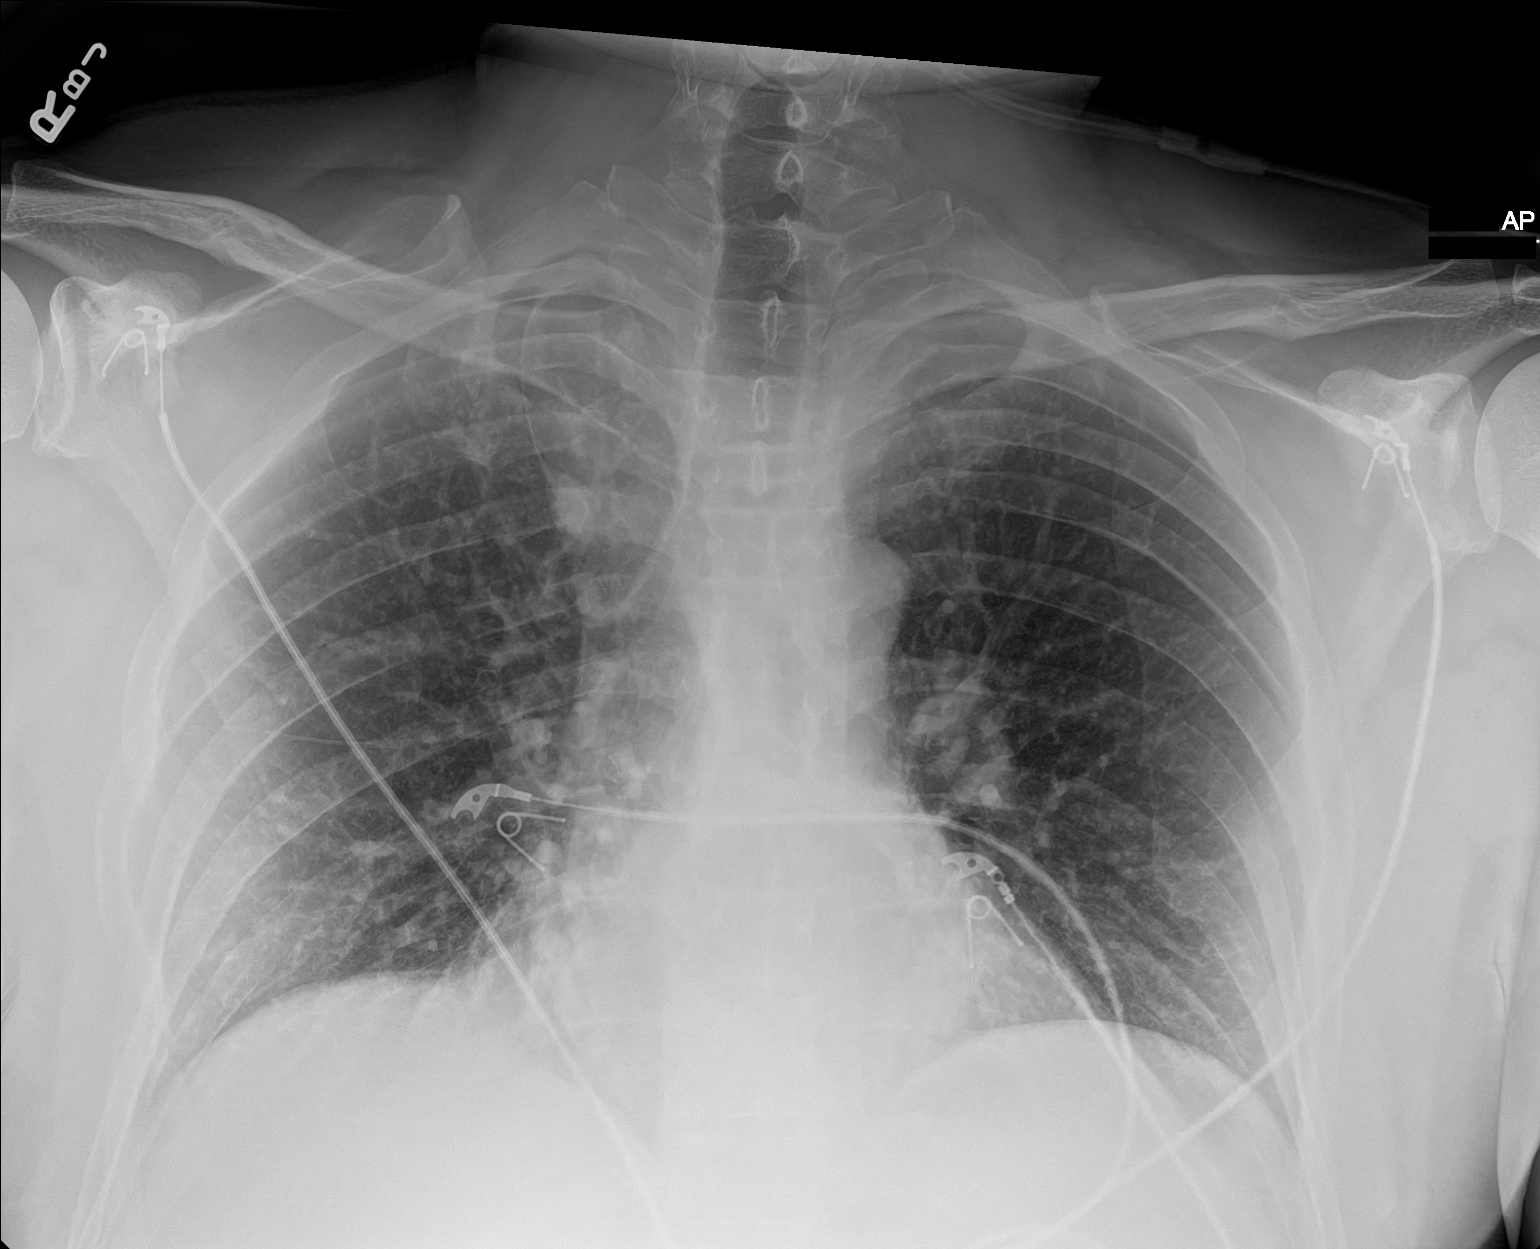

[1 of 1 positions shown; findings below may reference images not displayed]

FINDINGS: The mediastinal contours are within normal limits. No cardiomegaly.
Poor inspiratory effort. An azygos fissure is noted. The lungs are
otherwise clear bilaterally without evidence of focal consolidation,
pleural effusion, or pneumothorax. No acute osseous abnormality.
IMPRESSION: Mild pulmonary vascular crowding due to poor inspiratory effort. No
acute cardiopulmonary process.

## 2021-11-05 IMAGING — DX DG CHEST 1V PORT
1 series · 1 of 1 positions shown · non-contrast
Comparison: March 18, 2021

CLINICAL DATA: Aspiration

EXAM:
PORTABLE CHEST 1 VIEW

[chest ap]
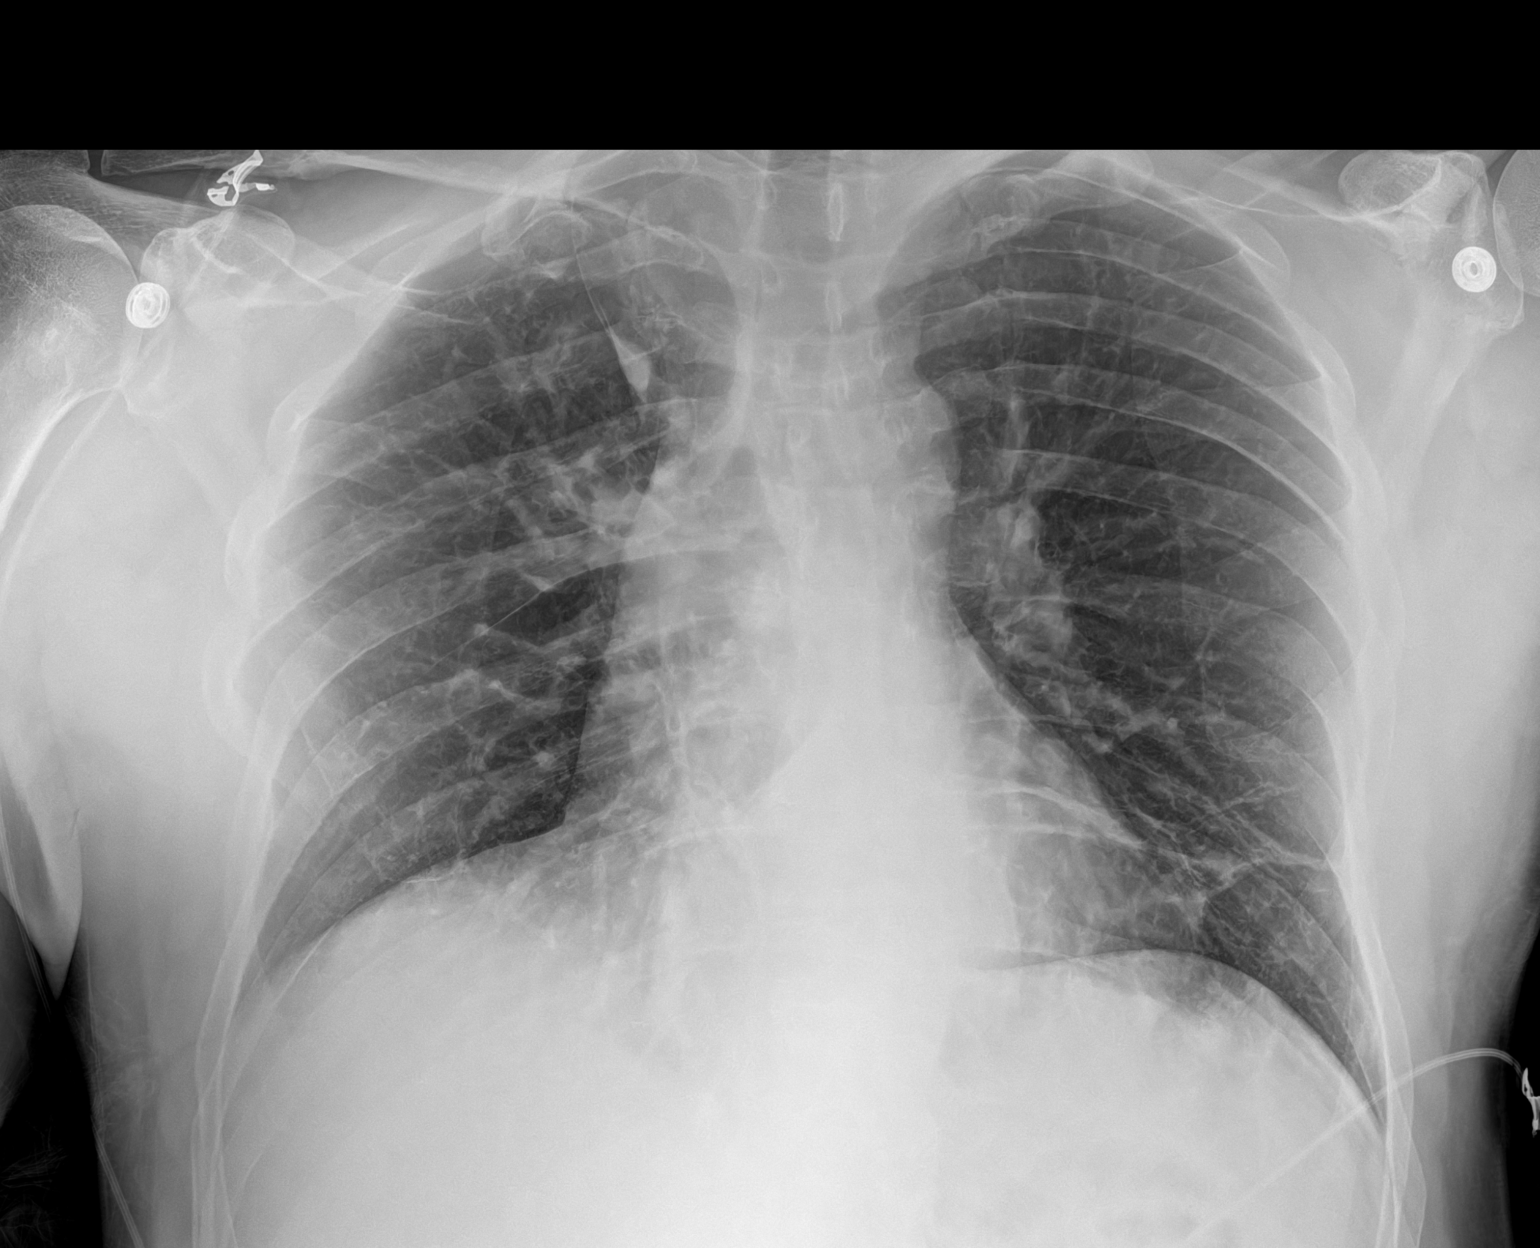

[1 of 1 positions shown; findings below may reference images not displayed]

FINDINGS: The cardiomediastinal silhouette is unchanged in contour.Moderate to
large hiatal hernia. Incidental note of an azygos fissure. No
pleural effusion. No pneumothorax. Scattered bibasilar linear
opacities consistent with atelectasis, increased in comparison to
prior. Similar appearance of RIGHT upper lung perihilar
heterogeneous opacity, nonspecific. Visualized abdomen is
unremarkable. No acute osseous abnormality.
IMPRESSION: 1. Increased bibasilar linear opacities, most consistent with
atelectasis.
2. Unchanged RIGHT upper lung perihilar opacity, nonspecific with
differential considerations including infection, aspiration or
atelectasis.

## 2021-11-14 DIAGNOSIS — S233XXA Sprain of ligaments of thoracic spine, initial encounter: Secondary | ICD-10-CM | POA: Diagnosis not present

## 2021-11-14 DIAGNOSIS — M7061 Trochanteric bursitis, right hip: Secondary | ICD-10-CM | POA: Diagnosis not present

## 2021-11-14 DIAGNOSIS — S134XXA Sprain of ligaments of cervical spine, initial encounter: Secondary | ICD-10-CM | POA: Diagnosis not present

## 2021-11-14 DIAGNOSIS — M4004 Postural kyphosis, thoracic region: Secondary | ICD-10-CM | POA: Diagnosis not present

## 2021-11-14 DIAGNOSIS — S338XXA Sprain of other parts of lumbar spine and pelvis, initial encounter: Secondary | ICD-10-CM | POA: Diagnosis not present

## 2021-11-20 ENCOUNTER — Other Ambulatory Visit: Payer: Self-pay | Admitting: Internal Medicine

## 2021-11-20 DIAGNOSIS — F419 Anxiety disorder, unspecified: Secondary | ICD-10-CM

## 2021-11-21 DIAGNOSIS — S338XXA Sprain of other parts of lumbar spine and pelvis, initial encounter: Secondary | ICD-10-CM | POA: Diagnosis not present

## 2021-11-21 DIAGNOSIS — M7061 Trochanteric bursitis, right hip: Secondary | ICD-10-CM | POA: Diagnosis not present

## 2021-11-21 DIAGNOSIS — S233XXA Sprain of ligaments of thoracic spine, initial encounter: Secondary | ICD-10-CM | POA: Diagnosis not present

## 2021-11-21 DIAGNOSIS — M4004 Postural kyphosis, thoracic region: Secondary | ICD-10-CM | POA: Diagnosis not present

## 2021-11-21 DIAGNOSIS — S134XXA Sprain of ligaments of cervical spine, initial encounter: Secondary | ICD-10-CM | POA: Diagnosis not present

## 2021-11-22 ENCOUNTER — Encounter (INDEPENDENT_AMBULATORY_CARE_PROVIDER_SITE_OTHER): Payer: Self-pay | Admitting: Gastroenterology

## 2021-11-22 ENCOUNTER — Other Ambulatory Visit: Payer: Self-pay

## 2021-11-22 ENCOUNTER — Ambulatory Visit (INDEPENDENT_AMBULATORY_CARE_PROVIDER_SITE_OTHER): Payer: 59 | Admitting: Gastroenterology

## 2021-11-22 VITALS — BP 143/89 | HR 78 | Temp 98.5°F | Ht 75.0 in | Wt 256.3 lb

## 2021-11-22 DIAGNOSIS — K5904 Chronic idiopathic constipation: Secondary | ICD-10-CM

## 2021-11-22 DIAGNOSIS — K449 Diaphragmatic hernia without obstruction or gangrene: Secondary | ICD-10-CM | POA: Diagnosis not present

## 2021-11-22 DIAGNOSIS — E782 Mixed hyperlipidemia: Secondary | ICD-10-CM | POA: Diagnosis not present

## 2021-11-22 DIAGNOSIS — K219 Gastro-esophageal reflux disease without esophagitis: Secondary | ICD-10-CM

## 2021-11-22 DIAGNOSIS — K5900 Constipation, unspecified: Secondary | ICD-10-CM | POA: Insufficient documentation

## 2021-11-22 DIAGNOSIS — R7401 Elevation of levels of liver transaminase levels: Secondary | ICD-10-CM | POA: Diagnosis not present

## 2021-11-22 DIAGNOSIS — K581 Irritable bowel syndrome with constipation: Secondary | ICD-10-CM | POA: Insufficient documentation

## 2021-11-22 DIAGNOSIS — K2270 Barrett's esophagus without dysplasia: Secondary | ICD-10-CM

## 2021-11-22 NOTE — Progress Notes (Signed)
Elijah Shaffer, M.D. Gastroenterology & Hepatology [facility] [address] [place], [place] [address]  Primary Care Physician: Lindell Spar, MD [address] [place] [address]  I will communicate my assessment and recommendations to the referring MD via EMR.  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 male with history of GERD complicated by long segment nondysplastic Barrett's esophagus, history of diverticulitis and anxiety, GERD and Barrett's esophagus.  Who presents for follow up of Barrett's esophagus.  The patient was last seen on 04/11/2021. At that time, the patient was scheduled to undergo an EGD and colonoscopy with findings described below.  He was continued on PPI twice a day.  Patient underwent EGD and colonoscopy in July 2022 with findings described below.  He was continued on omeprazole 40 mg twice a day.  Patient reports feeling well but he is concerned as he has presented persistent shortness of breath for multiple months along with intermittent episodes of lightheadedness when exerting himself.  He denies having heartburn but reports still having some belching/burping. It is not happening as usual as in the past but has not completely resolved.  He reports that he is taking a stool softener everyday ([employer] brand) once every day. He takes a one every day which helps him move his bowels frequently. He is still endorsing having bloating episodes freqquently. Has not tried other laxatives  Notably, the patient has been evaluated by pulmonology recently for syncopal episodes and dyspnea on exertion.  He is planned to have a right heart catheterization for evaluation of pulmonary hypertension.  He was also referred for rheumatology evaluation given elevated CK and ENA.   Importantly, he reports that he has been told there is a possibility he is shortness of breath may be related to his size of his hiatal hernia.  The patient denies having any nausea, vomiting, fever, chills, hematochezia, melena, hematemesis, abdominal distention, abdominal pain, diarrhea, jaundice, pruritus. Has gained some lb during Winter time.  Last EGD: 05/2021 - Esophageal mucosal changes consistent with long-segment Barrett's esophagus, classified as Barrett's stage C5-M6 per Prague criteria (between 30-36 cm). Biopsied - positive for BE but neg for dysplasia. - 9 cm hiatal hernia. - Normal stomach. - Normal examined duodenum.  Recommended to repeat in 3 years.  Last Colonoscopy: 05/2021 -  The perianal and digital rectal examinations were normal. Multiple large-mouthed diverticula were found in the sigmoid colon, transverse colon, ascending colon and cecum. Non-bleeding internal hemorrhoids were found during retroflexion. The  hemorrhoids were small.  Recommended to repeat in 5 years due to family history of cancer.  Past Medical History: Past Medical History:  Diagnosis Date   Acute renal injury ([place]) 06/28/2017   Anxiety    Aspiration pneumonia of right upper lobe due to gastric secretions ([place])    Diverticulitis    GERD (gastroesophageal reflux disease) 01/08/2018   Seasonal allergies    Syncope 06/26/2021    Past Surgical History: Past Surgical History:  Procedure Laterality Date   BIOPSY  03/18/2021   Procedure: BIOPSY;  Surgeon: Harvel Quale, MD;  [facility];  Service: Gastroenterology;;  esophageal at [facility]   BIOPSY  05/23/2021   Procedure: BIOPSY;  Surgeon: Harvel Quale, MD;  [facility];  Service: Gastroenterology;;   COLONOSCOPY N/A 09/22/2017   Procedure: COLONOSCOPY;  Surgeon:  Rogene Houston, MD;  [facility];  Service: Endoscopy;  Laterality: N/A;  9:55   COLONOSCOPY WITH PROPOFOL N/A 05/23/2021    Procedure: COLONOSCOPY WITH PROPOFOL;  Surgeon: Harvel Quale, MD;  [facility];  Service: Gastroenterology;  Laterality: N/A;  7:30   ESOPHAGOGASTRODUODENOSCOPY (EGD) WITH PROPOFOL N/A 03/18/2021   Procedure: ESOPHAGOGASTRODUODENOSCOPY (EGD) WITH PROPOFOL;  Surgeon: Harvel Quale, MD;  [facility];  Service: Gastroenterology;  Laterality: N/A;   ESOPHAGOGASTRODUODENOSCOPY (EGD) WITH PROPOFOL N/A 05/23/2021   Procedure: ESOPHAGOGASTRODUODENOSCOPY (EGD) WITH PROPOFOL;  Surgeon: Harvel Quale, MD;  [facility];  Service: Gastroenterology;  Laterality: N/A;   POLYPECTOMY  09/22/2017   Procedure: POLYPECTOMY;  Surgeon: Rogene Houston, MD;  [facility];  Service: Endoscopy;;    Family History: Family History  Problem Relation Age of Onset   Colon cancer Maternal Grandmother    Colon cancer Maternal Grandfather    Healthy Mother    Healthy Father     Social History: Social History   Tobacco Use  Smoking Status Never  Smokeless Tobacco Never   Social History   Substance and Sexual Activity  Alcohol Use Not Currently   Social History   Substance and Sexual Activity  Drug Use No    Allergies: Allergies  Allergen Reactions   Pollen Extract    Claritin [Loratadine] Other (See Comments)    Heart race    Medications: Current Outpatient Medications  Medication Sig Dispense Refill   acetaminophen (TYLENOL) 500 MG tablet Take 500 mg by mouth every 6 (six) hours as needed.     albuterol (VENTOLIN HFA) 108 (90 Base) MCG/ACT inhaler Inhale 2 puffs into the lungs every 6 (six) hours as needed for shortness of breath. 8 g 0   b complex vitamins capsule Take 1 capsule by mouth daily. With D3 gummie     betamethasone dipropionate 0.05 % cream Apply topically 2 (two) times daily as needed.     cetirizine (ZYRTEC) 10 MG tablet Take 10 mg by mouth daily.     docusate sodium (COLACE) 100 MG capsule Take 100  mg by mouth daily.     FLUoxetine (PROZAC) 10 MG capsule Take 1 capsule by mouth once daily 30 capsule 0   fluticasone (FLONASE ALLERGY RELIEF) 50 MCG/ACT nasal spray Place 2 sprays into both nostrils daily as needed for allergies or rhinitis. 9.9 mL 2   fluticasone-salmeterol (ADVAIR DISKUS) 250-50 MCG/ACT AEPB Inhale 1 puff into the lungs in the morning and at bedtime. 60 each 6   guaiFENesin (MUCINEX) 600 MG 12 hr tablet Take 600 mg by mouth 2 (two) times daily as needed for to loosen phlegm.     hydrOXYzine (ATARAX/VISTARIL) 10 MG tablet Take 1 tablet (10 mg total) by mouth 3 (three) times daily as needed for anxiety. 30 tablet 0   meclizine (ANTIVERT) 12.5 MG tablet meclizine 12.5 mg tablet  TAKE 1 TABLET BY MOUTH THREE TIMES DAILY FOR DIZZINESS     montelukast (SINGULAIR) 10 MG tablet Take 1 tablet (10 mg total) by mouth at bedtime. 30 tablet 11   Multiple Vitamins-Minerals (MULTIVITAMIN WITH MINERALS) tablet Take 1 tablet by mouth daily. Patch     omega-3 acid ethyl esters (LOVAZA) 1 g capsule Take 1 g by mouth daily.     ondansetron (ZOFRAN) 4 MG tablet Take 1 tablet (4 mg total) by mouth every 6 (six) hours as needed for nausea. 20 tablet 0   pantoprazole (  PROTONIX) 40 MG tablet Take 1 tablet (40 mg total) by mouth 2 (two) times daily. 180 tablet 3   No current facility-administered medications for this visit.    Review of Systems: GENERAL: negative for malaise, night sweats HEENT: No changes in hearing or vision, no nose bleeds or other nasal problems. NECK: Negative for lumps, goiter, pain and significant neck swelling RESPIRATORY: Negative for cough, wheezing CARDIOVASCULAR: Negative for chest pain, leg swelling, palpitations, orthopnea GI: SEE HPI MUSCULOSKELETAL: Negative for joint pain or swelling, back pain, and muscle pain. SKIN: Negative for lesions, rash PSYCH: Negative for sleep disturbance, mood disorder and recent psychosocial stressors. HEMATOLOGY Negative for  prolonged bleeding, bruising easily, and swollen nodes. ENDOCRINE: Negative for cold or heat intolerance, polyuria, polydipsia and goiter. NEURO: negative for tremor, gait imbalance, syncope and seizures. The remainder of the review of systems is noncontributory.   Physical Exam: BP (!) 143/89 (BP Location: Left Arm, Patient Position: Sitting, Cuff Size: Large)    Pulse 78    Temp 98.5 F (36.9 C) (Oral)    Ht 6\' 3"  (1.905 m)    Wt 256 lb 4.8 oz (116.3 kg)    BMI 32.04 kg/m  GENERAL: The patient is AO x3, in no acute distress. HEENT: Head is normocephalic and atraumatic. EOMI are intact. Mouth is well hydrated and without lesions. NECK: Supple. No masses LUNGS: Clear to auscultation. No presence of rhonchi/wheezing/rales. Adequate chest expansion HEART: RRR, normal s1 and s2. ABDOMEN: Soft, nontender, no guarding, no peritoneal signs, and nondistended. BS +. No masses. EXTREMITIES: Without any cyanosis, clubbing, rash, lesions or edema. NEUROLOGIC: AOx3, no focal motor deficit. SKIN: no jaundice, no rashes  Imaging/Labs: as above  I personally reviewed and interpreted the available labs, imaging and endoscopic files.  Impression and Plan: Elijah Shaffer is a 41 y.o. male with history of GERD complicated by long segment nondysplastic Barrett's esophagus, history of diverticulitis and anxiety, GERD and Barrett's esophagus, who presents for follow up of Barrett's esophagus.  Overall, the patient has been doing well on PPI twice a day.  He is still presenting some episodes of burping but no heartburn, dysphagia or odynophagia.  This could be related to aerophagia, for which he would benefit from implementing diaphragmatic breathing.  I do consider he should continue taking omeprazole 40 mg every 12 hours and we can discuss decreasing the dosage to once a day in his follow-up appointment.  He will need to have a repeat EGD 3 years from his last endoscopy for surveillance of Barrett's esophagus.   He has not presented any more episodes of coffee-ground emesis which is reassuring.  I discussed with him that there is a possibility his hiatal hernia may be compressing his heart or lung parenchyma and could be leading to his episodes of shortness of breath, but I still consider other causes will need to be ruled out before he proceeds with hiatal hernia correction.  Importantly, correcting the hiatal hernia may improve the frequency of his reflux episodes. Finally, I advised him to start taking MiraLAX to improve his bowel movement frequency and he can stop taking stool softeners.  - Continue omeprazole 40 mg every 12 hours - Practice diaphragmatic breathing for belching episodes - Stop stool softeners - Start taking Miralax 1 capful every day for one week. If bowel movements do not improve, increase to 1 capful every 12 hours. If after two weeks there is no improvement, increase to 1 capful every 8 hours  All questions were  answered.      Harvel Quale, MD Gastroenterology and Hepatology [facility]

## 2021-11-22 NOTE — Patient Instructions (Addendum)
Continue omeprazole 40 mg every 12 hours Practice diaphragmatic breathing for belching episodes Stop stool softeners - Start taking Miralax 1 capful every day for one week. If bowel movements do not improve, increase to 1 capful every 12 hours. If after two weeks there is no improvement, increase to 1 capful every 8 hours

## 2021-11-23 ENCOUNTER — Other Ambulatory Visit: Payer: Self-pay | Admitting: Internal Medicine

## 2021-11-23 LAB — LIPID PANEL
Chol/HDL Ratio: 3.9 ratio (ref 0.0–5.0)
Cholesterol, Total: 122 mg/dL (ref 100–199)
HDL: 31 mg/dL — ABNORMAL LOW (ref >39)
LDL Chol Calc (NIH): 25 mg/dL (ref 0–99)
Triglycerides: 481 mg/dL — ABNORMAL HIGH (ref 0–149)
VLDL Cholesterol Cal: 66 mg/dL — ABNORMAL HIGH (ref 5–40)

## 2021-11-23 LAB — CMP14+EGFR
ALT: 75 IU/L — ABNORMAL HIGH (ref 0–44)
AST: 45 IU/L — ABNORMAL HIGH (ref 0–40)
Albumin/Globulin Ratio: 1.9 (ref 1.2–2.2)
Albumin: 4.8 g/dL (ref 4.0–5.0)
Alkaline Phosphatase: 81 IU/L (ref 44–121)
BUN/Creatinine Ratio: 11 (ref 9–20)
BUN: 13 mg/dL (ref 6–24)
Bilirubin Total: 0.5 mg/dL (ref 0.0–1.2)
CO2: 21 mmol/L (ref 20–29)
Calcium: 9.7 mg/dL (ref 8.7–10.2)
Chloride: 105 mmol/L (ref 96–106)
Creatinine, Ser: 1.18 mg/dL (ref 0.76–1.27)
Globulin, Total: 2.5 g/dL (ref 1.5–4.5)
Glucose: 85 mg/dL (ref 70–99)
Potassium: 4 mmol/L (ref 3.5–5.2)
Sodium: 142 mmol/L (ref 134–144)
Total Protein: 7.3 g/dL (ref 6.0–8.5)
eGFR: 80 mL/min/{1.73_m2} (ref >59)

## 2021-11-26 ENCOUNTER — Telehealth: Payer: Self-pay | Admitting: Pulmonary Disease

## 2021-11-26 DIAGNOSIS — R030 Elevated blood-pressure reading, without diagnosis of hypertension: Secondary | ICD-10-CM | POA: Diagnosis not present

## 2021-11-26 DIAGNOSIS — R519 Headache, unspecified: Secondary | ICD-10-CM | POA: Diagnosis not present

## 2021-11-26 DIAGNOSIS — K5792 Diverticulitis of intestine, part unspecified, without perforation or abscess without bleeding: Secondary | ICD-10-CM | POA: Diagnosis not present

## 2021-11-26 DIAGNOSIS — R931 Abnormal findings on diagnostic imaging of heart and coronary circulation: Secondary | ICD-10-CM

## 2021-11-26 DIAGNOSIS — R569 Unspecified convulsions: Secondary | ICD-10-CM | POA: Diagnosis not present

## 2021-11-26 DIAGNOSIS — K219 Gastro-esophageal reflux disease without esophagitis: Secondary | ICD-10-CM | POA: Diagnosis not present

## 2021-11-26 DIAGNOSIS — R0609 Other forms of dyspnea: Secondary | ICD-10-CM

## 2021-11-26 DIAGNOSIS — R5383 Other fatigue: Secondary | ICD-10-CM

## 2021-11-26 DIAGNOSIS — R55 Syncope and collapse: Secondary | ICD-10-CM | POA: Diagnosis not present

## 2021-11-26 DIAGNOSIS — G459 Transient cerebral ischemic attack, unspecified: Secondary | ICD-10-CM | POA: Diagnosis not present

## 2021-11-26 NOTE — Telephone Encounter (Signed)
Attempted to call pt but unable to reach. Left message for him to return call. °

## 2021-11-26 NOTE — Telephone Encounter (Signed)
I do not have any further recommendations on his symptoms at this time.   I messaged with the cardiologists about setting up the [facility] and they said they would take care of it. I apologize that this has not been scheduled yet. Thank you for placing the referral to cardiology for further evaluation. I will reach back out to the cardiology team.  JD

## 2021-11-26 NOTE — Telephone Encounter (Signed)
Spoke with the pt  He wanted to let Dr Erin Fulling know that he has been having episodes of feeling lightheaded past 2 days  He got dizzy walking through his kitchen last night and then again this morning walking to the mailbox  He says he is still having the same SOB since last visit  He says he never stopped the advair bc he thought he was supposed to keep using this  He has not been set up for heart cath yet- cards referral was never placed so I went ahead and placed it today  Also, will ask [facility] on update on his PSG to be done at [facility]   Dr Erin Fulling, please advise on pt's symptoms   PCC's please advise on PSG and be aware I just placed cards referral, thanks!

## 2021-11-27 ENCOUNTER — Ambulatory Visit (INDEPENDENT_AMBULATORY_CARE_PROVIDER_SITE_OTHER): Payer: 59 | Admitting: Internal Medicine

## 2021-11-27 ENCOUNTER — Other Ambulatory Visit: Payer: Self-pay

## 2021-11-27 ENCOUNTER — Encounter: Payer: Self-pay | Admitting: Internal Medicine

## 2021-11-27 VITALS — BP 128/88 | HR 80 | Resp 18 | Ht 75.0 in | Wt 247.1 lb

## 2021-11-27 DIAGNOSIS — W57XXXA Bitten or stung by nonvenomous insect and other nonvenomous arthropods, initial encounter: Secondary | ICD-10-CM | POA: Diagnosis not present

## 2021-11-27 DIAGNOSIS — K2270 Barrett's esophagus without dysplasia: Secondary | ICD-10-CM

## 2021-11-27 DIAGNOSIS — E782 Mixed hyperlipidemia: Secondary | ICD-10-CM

## 2021-11-27 DIAGNOSIS — I272 Pulmonary hypertension, unspecified: Secondary | ICD-10-CM

## 2021-11-27 MED ORDER — DOXYCYCLINE HYCLATE 100 MG PO TABS: 100.0000 mg | ORAL_TABLET | Freq: Two times a day (BID) | ORAL | 0 refills | Status: DC

## 2021-11-27 MED ORDER — OMEGA-3-ACID ETHYL ESTERS 1 G PO CAPS: 2.0000 g | ORAL_CAPSULE | Freq: Two times a day (BID) | ORAL | 3 refills | Status: DC

## 2021-11-27 NOTE — Patient Instructions (Signed)
Please start taking Doxycycline as prescribed.  Please start taking Lovaza 2 capsules twice daily.  Please continue to use inhalers as prescribed.

## 2021-11-27 NOTE — Telephone Encounter (Signed)
Attempted to call patient but per his chart, he is in the middle of the PCP visit. Will leave in triage to contact him in a few hours.

## 2021-11-28 ENCOUNTER — Encounter (HOSPITAL_COMMUNITY): Payer: Self-pay

## 2021-11-28 ENCOUNTER — Other Ambulatory Visit (HOSPITAL_COMMUNITY): Payer: Self-pay

## 2021-11-28 DIAGNOSIS — M4004 Postural kyphosis, thoracic region: Secondary | ICD-10-CM | POA: Diagnosis not present

## 2021-11-28 DIAGNOSIS — I272 Pulmonary hypertension, unspecified: Secondary | ICD-10-CM

## 2021-11-28 DIAGNOSIS — S134XXA Sprain of ligaments of cervical spine, initial encounter: Secondary | ICD-10-CM | POA: Diagnosis not present

## 2021-11-28 DIAGNOSIS — S338XXA Sprain of other parts of lumbar spine and pelvis, initial encounter: Secondary | ICD-10-CM | POA: Diagnosis not present

## 2021-11-28 DIAGNOSIS — M7061 Trochanteric bursitis, right hip: Secondary | ICD-10-CM | POA: Diagnosis not present

## 2021-11-28 DIAGNOSIS — S233XXA Sprain of ligaments of thoracic spine, initial encounter: Secondary | ICD-10-CM | POA: Diagnosis not present

## 2021-11-28 NOTE — Telephone Encounter (Signed)
Message states to change PSG to [facility].  Sleep study that was ordered is home sleep study.  Will need order for inlab study if needs to be done at [facility].

## 2021-11-28 NOTE — Telephone Encounter (Signed)
Order has been placed for PSG at [facility].

## 2021-11-29 ENCOUNTER — Telehealth (HOSPITAL_COMMUNITY): Payer: Self-pay | Admitting: *Deleted

## 2021-11-30 DIAGNOSIS — E1169 Type 2 diabetes mellitus with other specified complication: Secondary | ICD-10-CM | POA: Insufficient documentation

## 2021-11-30 DIAGNOSIS — I272 Pulmonary hypertension, unspecified: Secondary | ICD-10-CM | POA: Insufficient documentation

## 2021-11-30 DIAGNOSIS — E782 Mixed hyperlipidemia: Secondary | ICD-10-CM | POA: Insufficient documentation

## 2021-11-30 NOTE — Assessment & Plan Note (Signed)
Has had echo, planned to get right heart cath Followed by pulmonology On Advair and as needed albuterol for dyspnea

## 2021-11-30 NOTE — Progress Notes (Signed)
Acute Office Visit  Subjective:    Patient ID: Elijah Shaffer, male    DOB: 1981/08/29, 41 y.o.   MRN: [id number]  Chief Complaint  Patient presents with   Dizziness    Pt started having dizziness on 11/25/21 also feeling fatigued since 11/27/21 sob never went away was wondering if tickbite could be cause of this has not gotten one off recently started having symptoms last April 2022    HPI Patient is in today for complaint of chronic fatigue, dizziness and dyspnea.  He has been evaluated by pulmonology for chronic dyspnea and has been started on Advair and as needed albuterol.  He is also scheduled to get right heart cath for pulmonary hypertension.  He is also going to see rheumatology for autoimmune work-up.  He is still concerned that he might have had a tick bite about a week ago.  He denies any fever, chills currently, but states that his fatigue has been worse for the last 1 week.  Is lipid profile showed improvement in LDL and TG, but his liver enzymes have elevated, likely from statin.  Past Medical History:  Diagnosis Date   Acute renal injury ([place]) 06/28/2017   Anxiety    Aspiration pneumonia of right upper lobe due to gastric secretions ([place])    Diverticulitis    GERD (gastroesophageal reflux disease) 01/08/2018   Seasonal allergies    Syncope 06/26/2021    Past Surgical History:  Procedure Laterality Date   BIOPSY  03/18/2021   Procedure: BIOPSY;  Surgeon: Harvel Quale, MD;  [facility];  Service: Gastroenterology;;  esophageal at [facility]   BIOPSY  05/23/2021   Procedure: BIOPSY;  Surgeon: Harvel Quale, MD;  [facility];  Service: Gastroenterology;;   COLONOSCOPY N/A 09/22/2017   Procedure: COLONOSCOPY;  Surgeon: Rogene Houston, MD;  [facility];  Service: Endoscopy;  Laterality: N/A;  9:55   COLONOSCOPY WITH PROPOFOL N/A 05/23/2021   Procedure: COLONOSCOPY WITH PROPOFOL;  Surgeon: Harvel Quale,  MD;  [facility];  Service: Gastroenterology;  Laterality: N/A;  7:30   ESOPHAGOGASTRODUODENOSCOPY (EGD) WITH PROPOFOL N/A 03/18/2021   Procedure: ESOPHAGOGASTRODUODENOSCOPY (EGD) WITH PROPOFOL;  Surgeon: Harvel Quale, MD;  [facility];  Service: Gastroenterology;  Laterality: N/A;   ESOPHAGOGASTRODUODENOSCOPY (EGD) WITH PROPOFOL N/A 05/23/2021   Procedure: ESOPHAGOGASTRODUODENOSCOPY (EGD) WITH PROPOFOL;  Surgeon: Harvel Quale, MD;  [facility];  Service: Gastroenterology;  Laterality: N/A;   POLYPECTOMY  09/22/2017   Procedure: POLYPECTOMY;  Surgeon: Rogene Houston, MD;  [facility];  Service: Endoscopy;;    Family History  Problem Relation Age of Onset   Colon cancer Maternal Grandmother    Colon cancer Maternal Grandfather    Healthy Mother    Healthy Father     Social History   Socioeconomic History   Marital status: Single    Spouse name: Not on file   Number of children: Not on file   Years of education: Not on file   Highest education level: Not on file  Occupational History   Not on file  Tobacco Use   Smoking status: Never   Smokeless tobacco: Never  Vaping Use   Vaping Use: Never used  Substance and Sexual Activity   Alcohol use: Not Currently   Drug use: No   Sexual activity: Not on file  Other Topics Concern   Not on file  Social History Narrative  Not on file   Social Determinants of Health   Financial Resource Strain: Not on file  Food Insecurity: Not on file  Transportation Needs: Not on file  Physical Activity: Not on file  Stress: Not on file  Social Connections: Not on file  Intimate Partner Violence: Not on file    Outpatient Medications Prior to Visit  Medication Sig Dispense Refill   acetaminophen (TYLENOL) 500 MG tablet Take 500 mg by mouth every 6 (six) hours as needed.     albuterol (VENTOLIN HFA) 108 (90 Base) MCG/ACT inhaler Inhale 2 puffs into the lungs every 6  (six) hours as needed for shortness of breath. 8 g 0   b complex vitamins capsule Take 1 capsule by mouth daily. With D3 gummie     betamethasone dipropionate 0.05 % cream Apply topically 2 (two) times daily as needed.     cetirizine (ZYRTEC) 10 MG tablet Take 10 mg by mouth daily.     docusate sodium (COLACE) 100 MG capsule Take 100 mg by mouth daily.     FLUoxetine (PROZAC) 10 MG capsule Take 1 capsule by mouth once daily 30 capsule 0   fluticasone (FLONASE ALLERGY RELIEF) 50 MCG/ACT nasal spray Place 2 sprays into both nostrils daily as needed for allergies or rhinitis. 9.9 mL 2   guaiFENesin (MUCINEX) 600 MG 12 hr tablet Take 600 mg by mouth 2 (two) times daily as needed for to loosen phlegm.     hydrOXYzine (ATARAX/VISTARIL) 10 MG tablet Take 1 tablet (10 mg total) by mouth 3 (three) times daily as needed for anxiety. 30 tablet 0   montelukast (SINGULAIR) 10 MG tablet Take 1 tablet (10 mg total) by mouth at bedtime. 30 tablet 11   Multiple Vitamins-Minerals (MULTIVITAMIN WITH MINERALS) tablet Take 1 tablet by mouth daily. Patch     ondansetron (ZOFRAN) 4 MG tablet Take 1 tablet (4 mg total) by mouth every 6 (six) hours as needed for nausea. 20 tablet 0   omega-3 acid ethyl esters (LOVAZA) 1 g capsule Take 1 g by mouth daily.     fluticasone-salmeterol (ADVAIR DISKUS) 250-50 MCG/ACT AEPB Inhale 1 puff into the lungs in the morning and at bedtime. (Patient not taking: Reported on 11/27/2021) 60 each 6   meclizine (ANTIVERT) 12.5 MG tablet meclizine 12.5 mg tablet  TAKE 1 TABLET BY MOUTH THREE TIMES DAILY FOR DIZZINESS (Patient not taking: Reported on 11/27/2021)     pantoprazole (PROTONIX) 40 MG tablet Take 1 tablet (40 mg total) by mouth 2 (two) times daily. 180 tablet 3   No facility-administered medications prior to visit.    Allergies  Allergen Reactions   Pollen Extract    Claritin [Loratadine] Other (See Comments)    Heart race    Review of Systems  Constitutional:  Positive for  fatigue and fever. Negative for chills.  HENT:  Negative for congestion and sore throat.   Eyes:  Negative for pain and discharge.  Respiratory:  Positive for shortness of breath. Negative for cough.   Cardiovascular:  Negative for chest pain and palpitations.  Gastrointestinal:  Negative for constipation, diarrhea, nausea and vomiting.  Endocrine: Negative for polydipsia and polyuria.  Genitourinary:  Negative for dysuria and hematuria.  Musculoskeletal:  Negative for neck pain and neck stiffness.  Skin:  Negative for rash.  Neurological:  Negative for dizziness, weakness, numbness and headaches.  Psychiatric/Behavioral:  Positive for sleep disturbance. Negative for agitation and behavioral problems. The patient is nervous/anxious.  Objective:    Physical Exam Vitals reviewed.  Constitutional:      General: He is not in acute distress.    Appearance: He is not diaphoretic.  HENT:     Head: Normocephalic and atraumatic.     Nose: Nose normal.     Mouth/Throat:     Mouth: Mucous membranes are moist.  Eyes:     General: No scleral icterus.    Extraocular Movements: Extraocular movements intact.  Cardiovascular:     Rate and Rhythm: Normal rate and regular rhythm.     Pulses: Normal pulses.     Heart sounds: Normal heart sounds. No murmur heard. Pulmonary:     Breath sounds: Normal breath sounds. No wheezing or rales.  Musculoskeletal:     Cervical back: Neck supple. No tenderness.     Right lower leg: No edema.     Left lower leg: No edema.  Skin:    General: Skin is warm.     Coloration: Skin is not jaundiced.  Neurological:     General: No focal deficit present.     Mental Status: He is alert and oriented to person, place, and time.  Psychiatric:        Mood and Affect: Mood normal.        Behavior: Behavior normal.    BP 128/88 (BP Location: Left Arm, Cuff Size: Normal)    Pulse 80    Resp 18    Ht 6\' 3"  (1.905 m)    Wt 247 lb 1.3 oz (112.1 kg)    SpO2 97%     BMI 30.88 kg/m  Wt Readings from Last 3 Encounters:  11/27/21 247 lb 1.3 oz (112.1 kg)  11/22/21 256 lb 4.8 oz (116.3 kg)  10/31/21 250 lb (113.4 kg)        Assessment & Plan:   Problem List Items Addressed This Visit       Cardiovascular and Mediastinum   Pulmonary HTN ([place])    Has had echo, planned to get right heart cath Followed by pulmonology On Advair and as needed albuterol for dyspnea      Relevant Medications   omega-3 acid ethyl esters (LOVAZA) 1 g capsule     Digestive   Barrett's esophagus    On Pantoprazole 40 mg QD Followed by GI        Other   Mixed hyperlipidemia    Lipid profile reviewed LDL and TG improved compared to prior Stopped statin due to elevated transaminase Started Lovaza for hyper TG      Relevant Medications   omega-3 acid ethyl esters (LOVAZA) 1 g capsule   Other Visit Diagnoses     Tick bite, unspecified site, initial encounter    -  Primary No attached ticks noticed He reports multiple tick bites frequently Doxycycline prescribed   Relevant Medications   doxycycline (VIBRA-TABS) 100 MG tablet        Meds ordered this encounter  Medications   omega-3 acid ethyl esters (LOVAZA) 1 g capsule    Sig: Take 2 capsules (2 g total) by mouth 2 (two) times daily.    Dispense:  120 capsule    Refill:  3   doxycycline (VIBRA-TABS) 100 MG tablet    Sig: Take 1 tablet (100 mg total) by mouth 2 (two) times daily.    Dispense:  14 tablet    Refill:  0     Odyn Turko Keith Rake, MD

## 2021-11-30 NOTE — Assessment & Plan Note (Signed)
On Pantoprazole 40 mg QD Followed by GI

## 2021-11-30 NOTE — Assessment & Plan Note (Signed)
Lipid profile reviewed LDL and TG improved compared to prior Stopped statin due to elevated transaminase Started Lovaza for hyper TG

## 2021-12-03 NOTE — Progress Notes (Signed)
Office Visit Note  Patient: Elijah Shaffer             Date of Birth: 12-31-1980           MRN: [id number]             PCP: Lindell Spar, MD Referring: Freddi Starr, MD Visit Date: 12/17/2021 Occupation: _0 @  Subjective:  Fatigue and abnormal labs  History of Present Illness: STEPHAN NELIS is a 41 y.o. male seen in consultation per request of Dr. Erin Fulling for the evaluation of elevated CK.  Patient is accompanied by his mom today.  According to the patient he has been working in [employer] for many years.  He states in March 2022 while he was at work he passed out and when he woke up he had some twitching in his left eye and vision changes.  He states he went back to work and after working for few more hours he had another syncopal episode and he woke up at any [facility] he was there for a week.  He states he was diagnosed with pneumonia and GI bleed and then he was discharged.  2 weeks later he returned to work and developed severe abdominal pain for which she was seen at [facility] and was discharged.  Then he was seen at urgent care where he was diagnosed with pharyngitis and was treated with antibiotics.  He states his fatigue and shortness of breath got worse.  He was seen at the hospital in [place] where he had extensive lab work and was told that he had Ehrlichiosis.  He had history of multiple tick bites.  He was given an empiric course of antibiotics which she felt helped him to some extent and as his symptoms persist he went back to his PCP who referred him to the ID physician who did not feel that the symptoms were related to ehrlichiosis.  He was referred to pulmonologist for shortness of breath.  He had extensive work-up including high-resolution CT which was negative for ILD and PFTs which showed normal TLC.  He had GI work-up and was diagnosed with Barrett's esophagus, hiatal hernia and colon polyps.  He was referred to Dr. Ellyn Hack for ongoing  shortness of breath.  He was diagnosed with pulmonary hypertension and recently had a cardiac cath.  During the pulmonary work-up his labs showed elevated CK for that reason he was referred to me.  He denies some rash on his ankles.  He denies any difficulty getting on the chair or raising his arms.  He has not noticed muscle weakness.  He states at nighttime he has some muscle pain in his calves and sometimes in his whole legs.  It is difficult for him to say if difficulty climbing his stairs is due to weakness or shortness of breath.  There is no family history of autoimmune disease.  Activities of Daily Living:  Patient reports morning stiffness for 0 minutes.   Patient Reports nocturnal pain.  Difficulty dressing/grooming: Denies Difficulty climbing stairs: Denies Difficulty getting out of chair: Denies Difficulty using hands for taps, buttons, cutlery, and/or writing: Denies  Review of Systems  Constitutional:  Positive for fatigue.  HENT:  Negative for mouth sores, mouth dryness and nose dryness.   Eyes:  Negative for pain, itching and dryness.  Respiratory:  Positive for shortness of breath. Negative for difficulty breathing.   Cardiovascular:  Negative for chest pain and palpitations.  Gastrointestinal:  Positive  for heartburn. Negative for blood in stool, constipation and diarrhea.  Endocrine: Negative for increased urination.  Genitourinary:  Negative for difficulty urinating.  Musculoskeletal:  Positive for joint pain, joint pain, myalgias and myalgias. Negative for joint swelling, morning stiffness and muscle tenderness.  Skin:  Positive for rash. Negative for color change and sensitivity to sunlight.  Allergic/Immunologic: Negative for susceptible to infections.  Neurological:  Positive for tremors, light-headedness and weakness. Negative for dizziness, numbness, headaches and memory loss.  Hematological:  Positive for bruising/bleeding tendency. Negative for swollen glands.   Psychiatric/Behavioral:  Positive for depressed mood. Negative for confusion and sleep disturbance. The patient is nervous/anxious.    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   Anal itching 12/07/2021   Chronic sinusitis 12/04/2021   Mixed hyperlipidemia 11/30/2021   Pulmonary HTN ([place]) 11/30/2021   Esophageal hiatal hernia 11/22/2021   Constipation 11/22/2021   Dyspnea on minimal exertion 75/08/2584   Ehrlichiosis 27/78/2423   Anxiety 06/26/2021   Eczema 06/26/2021   Barrett's esophagus 04/11/2021   History of colonic polyps 04/11/2021   Focal neurological deficit 02/23/2021   GERD (gastroesophageal reflux disease) 01/08/2018    Past Medical History:  Diagnosis Date   Acute renal injury ([place]) 06/28/2017   Anxiety    Aspiration pneumonia of right upper lobe due to gastric secretions (HCC)    Diverticulitis    GERD (gastroesophageal reflux disease) 01/08/2018   Seasonal allergies    Syncope 06/26/2021    Family History  Problem Relation Age of Onset   Healthy Mother    Cancer Father    Colon cancer Maternal Grandmother    Colon cancer Maternal Grandfather    Past Surgical History:  Procedure Laterality Date   BIOPSY  03/18/2021   Procedure: BIOPSY;  Surgeon: Harvel Quale, MD;  [facility];  Service: Gastroenterology;;  esophageal at [facility]-line   BIOPSY  05/23/2021   Procedure: BIOPSY;  Surgeon: Harvel Quale, MD;  [facility];  Service: Gastroenterology;;   COLONOSCOPY N/A 09/22/2017   Procedure: COLONOSCOPY;  Surgeon: Rogene Houston, MD;  [facility];  Service: Endoscopy;  Laterality: N/A;  9:55   COLONOSCOPY WITH PROPOFOL N/A 05/23/2021   Procedure: COLONOSCOPY WITH PROPOFOL;  Surgeon: Harvel Quale, MD;  [facility];  Service: Gastroenterology;  Laterality: N/A;  7:30   ESOPHAGOGASTRODUODENOSCOPY (EGD) WITH PROPOFOL N/A  03/18/2021   Procedure: ESOPHAGOGASTRODUODENOSCOPY (EGD) WITH PROPOFOL;  Surgeon: Harvel Quale, MD;  [facility];  Service: Gastroenterology;  Laterality: N/A;   ESOPHAGOGASTRODUODENOSCOPY (EGD) WITH PROPOFOL N/A 05/23/2021   Procedure: ESOPHAGOGASTRODUODENOSCOPY (EGD) WITH PROPOFOL;  Surgeon: Harvel Quale, MD;  [facility];  Service: Gastroenterology;  Laterality: N/A;   POLYPECTOMY  09/22/2017   Procedure: POLYPECTOMY;  Surgeon: Rogene Houston, MD;  [facility];  Service: Endoscopy;;   RIGHT/LEFT HEART CATH AND CORONARY ANGIOGRAPHY N/A 12/12/2021   Procedure: RIGHT/LEFT HEART CATH AND CORONARY ANGIOGRAPHY;  Surgeon: Jolaine Artist, MD;  Location: [facility];  Service: Cardiovascular;  Laterality: N/A;   TRANSTHORACIC ECHOCARDIOGRAM  02/24/2021   EF 60 to 65%.  Normal LV function.  Normal valves.  Mild RV dilation with normal function.   Social History   Social History Narrative   Not on file   Immunization History  Administered Date(s) Administered   Influenza,inj,Quad PF,6+ Mos 09/10/2021   Moderna SARS-COV2 Booster Vaccination  02/11/2020, 03/10/2020, 10/08/2020, 11/09/2021   Tdap 02/08/2017     Objective: Vital Signs: BP (!) 141/94 (BP Location: Left Arm, Patient Position: Sitting, Cuff Size: Large)    Pulse 68    Ht _0  (1.905 m)    Wt 253 lb (114.8 kg)    BMI 31.62 kg/m    Physical Exam Vitals and nursing note reviewed.  Constitutional:      Appearance: He is well-developed.  HENT:     Head: Normocephalic and atraumatic.  Eyes:     Conjunctiva/sclera: Conjunctivae normal.     Pupils: Pupils are equal, round, and reactive to light.  Cardiovascular:     Rate and Rhythm: Normal rate and regular rhythm.     Heart sounds: Normal heart sounds.  Pulmonary:     Effort: Pulmonary effort is normal.     Breath sounds: Normal breath sounds.  Abdominal:     General: Bowel sounds are normal.      Palpations: Abdomen is soft.  Musculoskeletal:     Cervical back: Normal range of motion and neck supple.  Skin:    General: Skin is warm and dry.     Capillary Refill: Capillary refill takes less than 2 seconds.  Neurological:     Mental Status: He is alert and oriented to person, place, and time.     Comments: Brisk DTRs  Psychiatric:        Behavior: Behavior normal.     Musculoskeletal Exam: C-spine was in good range of motion.  He has thoracic kyphosis but no point tenderness.  Shoulder joints and elbow joints with good range of motion.  His right wrist was in a splint.  Left wrist joint, MCPs PIPs and DIPs with good range of motion.  Hip joints and knee joints with good range of motion.  He had no tenderness over ankles or MTPs.  He had 5/5 strength in upper and lower extremities.  He had no difficulty getting up from the squatting position.  His DTRs were brisk.  CDAI Exam: CDAI Score: -- Patient Global: --; Provider Global: -- Swollen: --; Tender: -- Joint Exam 12/17/2021   No joint exam has been documented for this visit   There is currently no information documented on the homunculus. Go to the Rheumatology activity and complete the homunculus joint exam.  Investigation: No additional findings.  Imaging: CARDIAC CATHETERIZATION  Result Date: 12/12/2021   The left ventricular ejection fraction is 50-55% by visual estimate. Findings: RA = 4 RV = 23/4 PA = 22/6 (14) PCW = 7 Fick cardiac output/index = 7.1/3.0 PVR = 1.0 WU Ao sat = 95% PA sat = 76%, 76% SVC sat = 74% Assessment: 1. Normal coronary arteries 2. Normal hemodynamics 3. EF 50-55% by ventriculogram 4. Brief run of possible SVT prior to cath Plan/Discussion: Normal cath. Consider CPX testing to further evaluate cause of dyspnea. Place Zio monitor to further assess possible SVT. Glori Bickers, MD 9:12 AM    Recent Labs: Lab Results  Component Value Date   WBC 6.8 12/10/2021   HGB 14.6 12/12/2021   PLT 232  12/10/2021   NA 142 12/12/2021   K 3.4 (L) 12/12/2021   CL 104 12/12/2021   CO2 24 12/12/2021   GLUCOSE 111 (H) 12/12/2021   BUN 11 12/12/2021   CREATININE 1.41 (H) 12/12/2021   BILITOT 0.5 11/22/2021   ALKPHOS 81 11/22/2021   AST 45 (H) 11/22/2021   ALT 75 (H) 11/22/2021   PROT 7.3 11/22/2021  ALBUMIN 4.8 11/22/2021   CALCIUM 9.6 12/12/2021   GFRAA >60 10/14/2018    Speciality Comments: No specialty comments available.  Procedures:  No procedures performed Allergies: Pollen extract and Claritin [loratadine]   Assessment / Plan:     Visit Diagnoses: Elevated CK - 10/02/21:CK 356, Myomarker3 panel negative, aldolase 6, anti-CCP-, CRP<1, Scl-70-, SM ab-, dsDNA-, ANCA negative, ANA-, Ro-, La-, ESR 2, RNP 1.4, RF<14.  He was referred to me for elevated CK.  He had no muscular weakness or tenderness on examination.  He states he has some generalized achiness in his muscles at night when he sleeps.  He had no difficulty getting up from the sitting and squatting position.  His DTRs were brisk.  He complains of poor balance and dizziness.  He also has brisk DTRs.  I will refer him to neurology for evaluation.  Shortness of breath-he complains of shortness of breath.  He had extensive pulmonary work-up.  I reviewed records from Dr. August Albino previous visits.  High-resolution CT was negative.  PFTs were normal.  He also had cardiology work-up recently for the suspicion of pulmonary hypertension.  I reviewed records from Dr. Allison Quarry visits.  According to the cardiac cath report it was normal.  Patient will have follow-up with the cardiologist.  He gives history of increased shortness of breath on exertion.  Barrett's esophagus without dysplasia-he had GI work-up.  Gastroesophageal reflux disease without esophagitis  History of colonic polyps  Ehrlichiosis-he gives history of tick bite in the past.  Lyme titer was negative.  RMSF titer was negative.  Ehrlichia IgG was positive.  He was  treated with antibiotics empirically.  He was also evaluated by Dr. Linus Salmons,  infectious disease.  I reviewed his notes.  He did not think his symptoms are related to rickettsial disease.  Anxiety-he has longstanding history of depression and anxiety.  He is taking medications for it.  Esophageal hiatal hernia  Eczema, unspecified type-he was concerned about the rash on his lower extremities.  He had 2 small patches of eczema on his ankles. Topical steroid use was discussed.  Mixed hyperlipidemia    Orders: Orders Placed This Encounter  Procedures   CK   No orders of the defined types were placed in this encounter.   Face-to-face time spent with patient was 50 minutes. Greater than 50% of time was spent in counseling and coordination of care.  Follow-Up Instructions: Return for Elevated CK.   Bo Merino, MD  Note - This record has been created using Editor, commissioning.  Chart creation errors have been sought, but may not always  have been located. Such creation errors do not reflect on  the standard of medical care.

## 2021-12-04 ENCOUNTER — Other Ambulatory Visit: Payer: Self-pay

## 2021-12-04 ENCOUNTER — Encounter: Payer: Self-pay | Admitting: Internal Medicine

## 2021-12-04 ENCOUNTER — Ambulatory Visit (INDEPENDENT_AMBULATORY_CARE_PROVIDER_SITE_OTHER): Payer: 59 | Admitting: Internal Medicine

## 2021-12-04 DIAGNOSIS — J329 Chronic sinusitis, unspecified: Secondary | ICD-10-CM | POA: Diagnosis not present

## 2021-12-04 DIAGNOSIS — R0609 Other forms of dyspnea: Secondary | ICD-10-CM | POA: Diagnosis not present

## 2021-12-04 NOTE — Assessment & Plan Note (Signed)
Fatigue and dyspnea on exertion Has decreased DLCO on PFT with mild RV dilation, planned for right heart cath Uses Advair and PRN Albuterol Rheumatology eval for possible mixed connective tissue disorder - elevated CK level and elevated ENA RNP antibody

## 2021-12-04 NOTE — Progress Notes (Signed)
Virtual Visit via Telephone Note   This visit type was conducted due to national recommendations for restrictions regarding the COVID-19 Pandemic (e.g. social distancing) in an effort to limit this patient's exposure and mitigate transmission in our community.  Due to his co-morbid illnesses, this patient is at least at moderate risk for complications without adequate follow up.  This format is felt to be most appropriate for this patient at this time.  The patient did not have access to video technology/had technical difficulties with video requiring transitioning to audio format only (telephone).  All issues noted in this document were discussed and addressed.  No physical exam could be performed with this format.  Evaluation Performed:  Follow-up visit  Date:  12/04/2021   ID:  Elijah Shaffer, DOB 24-Nov-1980, MRN [id number]  Patient Location: Home Provider Location: Office/Clinic  Participants: Patient Location of Patient: Home Location of Provider: Telehealth Consent was obtain for visit to be over via telehealth. I verified that I am speaking with the correct person using two identifiers.  PCP:  Lindell Spar, MD   Chief Complaint: Chronic nasal congestion, dyspnea  History of Present Illness:    Elijah Shaffer is a 41 y.o. male who has a televisit for complaint of chronic nasal congestion and dyspnea, worse with exertion.  He recently completed doxycycline for possible tick bite.  He has been using Advair and as needed albuterol for dyspnea.  He has been having spells of dizziness recently as well and requests a referral to ENT specialist.  He is currently using Flonase along with Zyrtec and Singulair for chronic sinusitis.  The patient does not have symptoms concerning for COVID-19 infection (fever, chills, cough, or new shortness of breath).   Past Medical, Surgical, Social History, Allergies, and Medications have been Reviewed.  Past Medical History:  Diagnosis Date    Acute renal injury ([place]) 06/28/2017   Anxiety    Aspiration pneumonia of right upper lobe due to gastric secretions ([place])    Diverticulitis    GERD (gastroesophageal reflux disease) 01/08/2018   Seasonal allergies    Syncope 06/26/2021   Past Surgical History:  Procedure Laterality Date   BIOPSY  03/18/2021   Procedure: BIOPSY;  Surgeon: Harvel Quale, MD;  [facility];  Service: Gastroenterology;;  esophageal at [facility]   BIOPSY  05/23/2021   Procedure: BIOPSY;  Surgeon: Harvel Quale, MD;  [facility];  Service: Gastroenterology;;   COLONOSCOPY N/A 09/22/2017   Procedure: COLONOSCOPY;  Surgeon: Rogene Houston, MD;  [facility];  Service: Endoscopy;  Laterality: N/A;  9:55   COLONOSCOPY WITH PROPOFOL N/A 05/23/2021   Procedure: COLONOSCOPY WITH PROPOFOL;  Surgeon: Harvel Quale, MD;  [facility];  Service: Gastroenterology;  Laterality: N/A;  7:30   ESOPHAGOGASTRODUODENOSCOPY (EGD) WITH PROPOFOL N/A 03/18/2021   Procedure: ESOPHAGOGASTRODUODENOSCOPY (EGD) WITH PROPOFOL;  Surgeon: Harvel Quale, MD;  [facility];  Service: Gastroenterology;  Laterality: N/A;   ESOPHAGOGASTRODUODENOSCOPY (EGD) WITH PROPOFOL N/A 05/23/2021   Procedure: ESOPHAGOGASTRODUODENOSCOPY (EGD) WITH PROPOFOL;  Surgeon: Harvel Quale, MD;  [facility];  Service: Gastroenterology;  Laterality: N/A;   POLYPECTOMY  09/22/2017   Procedure: POLYPECTOMY;  Surgeon: Rogene Houston, MD;  [facility];  Service: Endoscopy;;     Current Meds  Medication Sig   acetaminophen (TYLENOL) 500 MG tablet Take 500 mg by mouth every 6 (six) hours as needed.  albuterol (VENTOLIN HFA) 108 (90 Base) MCG/ACT inhaler Inhale 2 puffs into the lungs every 6 (six) hours as needed for shortness of breath.   b complex vitamins capsule Take 1 capsule by mouth daily. With D3 gummie   betamethasone dipropionate  0.05 % cream Apply topically 2 (two) times daily as needed.   cetirizine (ZYRTEC) 10 MG tablet Take 10 mg by mouth daily.   docusate sodium (COLACE) 100 MG capsule Take 100 mg by mouth daily.   doxycycline (VIBRA-TABS) 100 MG tablet Take 1 tablet (100 mg total) by mouth 2 (two) times daily.   FLUoxetine (PROZAC) 10 MG capsule Take 1 capsule by mouth once daily   fluticasone (FLONASE ALLERGY RELIEF) 50 MCG/ACT nasal spray Place 2 sprays into both nostrils daily as needed for allergies or rhinitis.   fluticasone-salmeterol (ADVAIR DISKUS) 250-50 MCG/ACT AEPB Inhale 1 puff into the lungs in the morning and at bedtime.   guaiFENesin (MUCINEX) 600 MG 12 hr tablet Take 600 mg by mouth 2 (two) times daily as needed for to loosen phlegm.   hydrOXYzine (ATARAX/VISTARIL) 10 MG tablet Take 1 tablet (10 mg total) by mouth 3 (three) times daily as needed for anxiety.   meclizine (ANTIVERT) 12.5 MG tablet    montelukast (SINGULAIR) 10 MG tablet Take 1 tablet (10 mg total) by mouth at bedtime.   Multiple Vitamins-Minerals (MULTIVITAMIN WITH MINERALS) tablet Take 1 tablet by mouth daily. Patch   omega-3 acid ethyl esters (LOVAZA) 1 g capsule Take 2 capsules (2 g total) by mouth 2 (two) times daily.   ondansetron (ZOFRAN) 4 MG tablet Take 1 tablet (4 mg total) by mouth every 6 (six) hours as needed for nausea.     Allergies:   Pollen extract and Claritin [loratadine]   ROS:   Please see the history of present illness.     All other systems reviewed and are negative.   Labs/Other Tests and Data Reviewed:    Recent Labs: 03/17/2021: B Natriuretic Peptide 17.0 03/20/2021: Magnesium 2.0 07/18/2021: TSH 1.440 10/02/2021: Hemoglobin 15.9; Platelets 210.0 11/22/2021: ALT 75; BUN 13; Creatinine, Ser 1.18; Potassium 4.0; Sodium 142   Recent Lipid Panel Lab Results  Component Value Date/Time   CHOL 122 11/22/2021 03:36 PM   TRIG 481 (H) 11/22/2021 03:36 PM   HDL 31 (L) 11/22/2021 03:36 PM   CHOLHDL 3.9  11/22/2021 03:36 PM   CHOLHDL 3.6 02/24/2021 05:25 AM   LDLCALC 25 11/22/2021 03:36 PM    Wt Readings from Last 3 Encounters:  11/27/21 247 lb 1.3 oz (112.1 kg)  11/22/21 256 lb 4.8 oz (116.3 kg)  10/31/21 250 lb (113.4 kg)     ASSESSMENT & PLAN:    Chronic sinusitis Uses Flonase Continue Singulair and Zyrtec for allergies Referred to ENT  Dyspnea on exertion Fatigue and dyspnea on exertion Has decreased DLCO on PFT with mild RV dilation, planned for right heart cath Uses Advair and PRN Albuterol Rheumatology eval for possible mixed connective tissue disorder - elevated CK level and elevated ENA RNP antibody    Time:   Today, I have spent 13 minutes reviewing the chart, including problem list, medications, and with the patient with telehealth technology discussing the above problems.   Medication Adjustments/Labs and Tests Ordered: Current medicines are reviewed at length with the patient today.  Concerns regarding medicines are outlined above.   Tests Ordered: No orders of the defined types were placed in this encounter.   Medication Changes: No orders of the defined types were  placed in this encounter.    Note: This dictation was prepared with Dragon dictation along with smaller phrase technology. Similar sounding words can be transcribed inadequately or may not be corrected upon review. Any transcriptional errors that result from this process are unintentional.      Disposition:  Follow up  Signed, Lindell Spar, MD  12/04/2021 11:05 AM     [place]

## 2021-12-04 NOTE — Assessment & Plan Note (Signed)
Uses Flonase Continue Singulair and Zyrtec for allergies Referred to ENT

## 2021-12-04 NOTE — Patient Instructions (Signed)
Please continue taking Singulair and Zyrtec for allergies.  Continue to use Flonase for chronic sinusitis.  You are being referred to ENT specialist.

## 2021-12-05 DIAGNOSIS — S134XXA Sprain of ligaments of cervical spine, initial encounter: Secondary | ICD-10-CM | POA: Diagnosis not present

## 2021-12-05 DIAGNOSIS — S233XXA Sprain of ligaments of thoracic spine, initial encounter: Secondary | ICD-10-CM | POA: Diagnosis not present

## 2021-12-05 DIAGNOSIS — M4004 Postural kyphosis, thoracic region: Secondary | ICD-10-CM | POA: Diagnosis not present

## 2021-12-05 DIAGNOSIS — M7061 Trochanteric bursitis, right hip: Secondary | ICD-10-CM | POA: Diagnosis not present

## 2021-12-05 DIAGNOSIS — S338XXA Sprain of other parts of lumbar spine and pelvis, initial encounter: Secondary | ICD-10-CM | POA: Diagnosis not present

## 2021-12-07 ENCOUNTER — Other Ambulatory Visit: Payer: Self-pay

## 2021-12-07 ENCOUNTER — Encounter: Payer: Self-pay | Admitting: Nurse Practitioner

## 2021-12-07 ENCOUNTER — Telehealth (INDEPENDENT_AMBULATORY_CARE_PROVIDER_SITE_OTHER): Payer: Self-pay | Admitting: *Deleted

## 2021-12-07 ENCOUNTER — Ambulatory Visit (INDEPENDENT_AMBULATORY_CARE_PROVIDER_SITE_OTHER): Payer: 59 | Admitting: Nurse Practitioner

## 2021-12-07 VITALS — BP 131/94 | HR 95 | Resp 16 | Ht 75.0 in | Wt 251.1 lb

## 2021-12-07 DIAGNOSIS — F419 Anxiety disorder, unspecified: Secondary | ICD-10-CM | POA: Diagnosis not present

## 2021-12-07 DIAGNOSIS — L290 Pruritus ani: Secondary | ICD-10-CM | POA: Insufficient documentation

## 2021-12-07 DIAGNOSIS — R69 Illness, unspecified: Secondary | ICD-10-CM | POA: Diagnosis not present

## 2021-12-07 MED ORDER — FLUOXETINE HCL 10 MG PO CAPS: 10.0000 mg | ORAL_CAPSULE | Freq: Every day | ORAL | 1 refills | Status: DC

## 2021-12-07 MED ORDER — HYDROCORTISONE 1 % EX OINT: 1.0000 "application " | TOPICAL_OINTMENT | Freq: Two times a day (BID) | CUTANEOUS | 0 refills | Status: DC

## 2021-12-07 MED ORDER — DIPHENHYDRAMINE-ZINC ACETATE 2-0.1 % EX CREA: 1.0000 "application " | TOPICAL_CREAM | Freq: Three times a day (TID) | CUTANEOUS | 0 refills | Status: AC | PRN

## 2021-12-07 NOTE — Assessment & Plan Note (Signed)
Take prozac 10mg  daily, med refilled, take hydroxyzine as needed.  Pt referred for counselling

## 2021-12-07 NOTE — Progress Notes (Signed)
° °  RAMAJ FRANGOS     MRN: [id number]      DOB: [birth year]   HPI Mr. Elijah Shaffer is here for follow complaints anal itching, itching started on 5 days ago and its getting worse. , never had this symptom before. He has tried using hemorrhoid cream yesterday but its not working.  Takes miralax and stool softer for constipation. He does use scented products for washing his clothes   Patient complains of depression he said he has been passing through a lot, he has not been able to walk as much as he would like to due to been sob he has a heart cath coming up in February . he would like a referral to therapy, he has been taking Prozac 10 mg as needed, Vistaril makes him sleepy.  Denies SI, HI  ROS Denies recent fever or chills. Denies sinus pressure, nasal congestion, ear pain or sore throat. Denies chest congestion, productive cough or wheezing, has sob Denies chest pains, palpitations and leg swelling Denies abdominal pain, nausea, vomiting,diarrhea or constipation.   Denies dysuria, frequency, hesitancy or incontinence. Has depression, anxiety or insomnia. Has anal itching, no open ulcers   PE Cherrryl cma present as chaperone BP (!) 131/94    Pulse 95    Resp 16    Ht 6\' 3"  (1.905 m)    Wt 251 lb 1.9 oz (113.9 kg)    SpO2 96%    BMI 31.39 kg/m   Patient alert and oriented and in no cardiopulmonary distress.  HEENT: No facial asymmetry, EOMI,     Neck supple .  Chest: Clear to auscultation bilaterally.  CVS: S1, S2 no murmurs, no S3.Regular rate.  ABD: Soft non tender.   Ext: No edema  MS: Adequate ROM spine, shoulders, hips and knees.  Skin: anal area appear red on examination, no ulcers or drainage noted.no hemorrhoids, no foreign body.   Psych: Good eye contact, normal affect. Memory intact, pt is anxious, but does not  depressed appearing.    Assessment & Plan

## 2021-12-07 NOTE — Telephone Encounter (Signed)
Pt left vm to give him a call on [phone]. I called and got his voicemail. I left a message to call me back and let him know we are here in office today til 11:30.

## 2021-12-07 NOTE — Assessment & Plan Note (Signed)
anal area appear red on examination, no ulcers or drainage noted.no hemorrhoids, no foreign body.  RX benadryl cream Hydrocortisone ointment

## 2021-12-07 NOTE — Telephone Encounter (Signed)
Patient states he is having some itchy in anal area and pharmacy gave him some hemorrhoid cream that is not helping. States this is a new issue that just started a few days ago. Advised patient to call pcp for advise since no provider in office today or Monday.

## 2021-12-07 NOTE — Patient Instructions (Addendum)
Please use Benadryl cream 3-4 times daily as needed for itching. Use hydrocortisone ointment two times daily as needed for itching. Take Prozac 10mg  daily for your depression.   Tight-fitting clothing should be avoided as these trap moisture in the anal area. Patients should be advised to wear cotton undergarments. ?The anoderm should be kept clean and dry without excessive wiping or use of astringent cleaners [10]. In severe cases, patients should be instructed to bathe following defecation. The area should be dried using a soft towel with a dabbing motion, or with a hair dryer on the cool setting. Alternatively, a premoistened pad or tissue can be used for wiping.

## 2021-12-09 IMAGING — DX DG CHEST 2V
2 series · 2 of 2 positions shown · non-contrast
Comparison: 03/20/2021

CLINICAL DATA: Aspiration pneumonia, follow-up

EXAM:
CHEST - 2 VIEW

[chest pa]
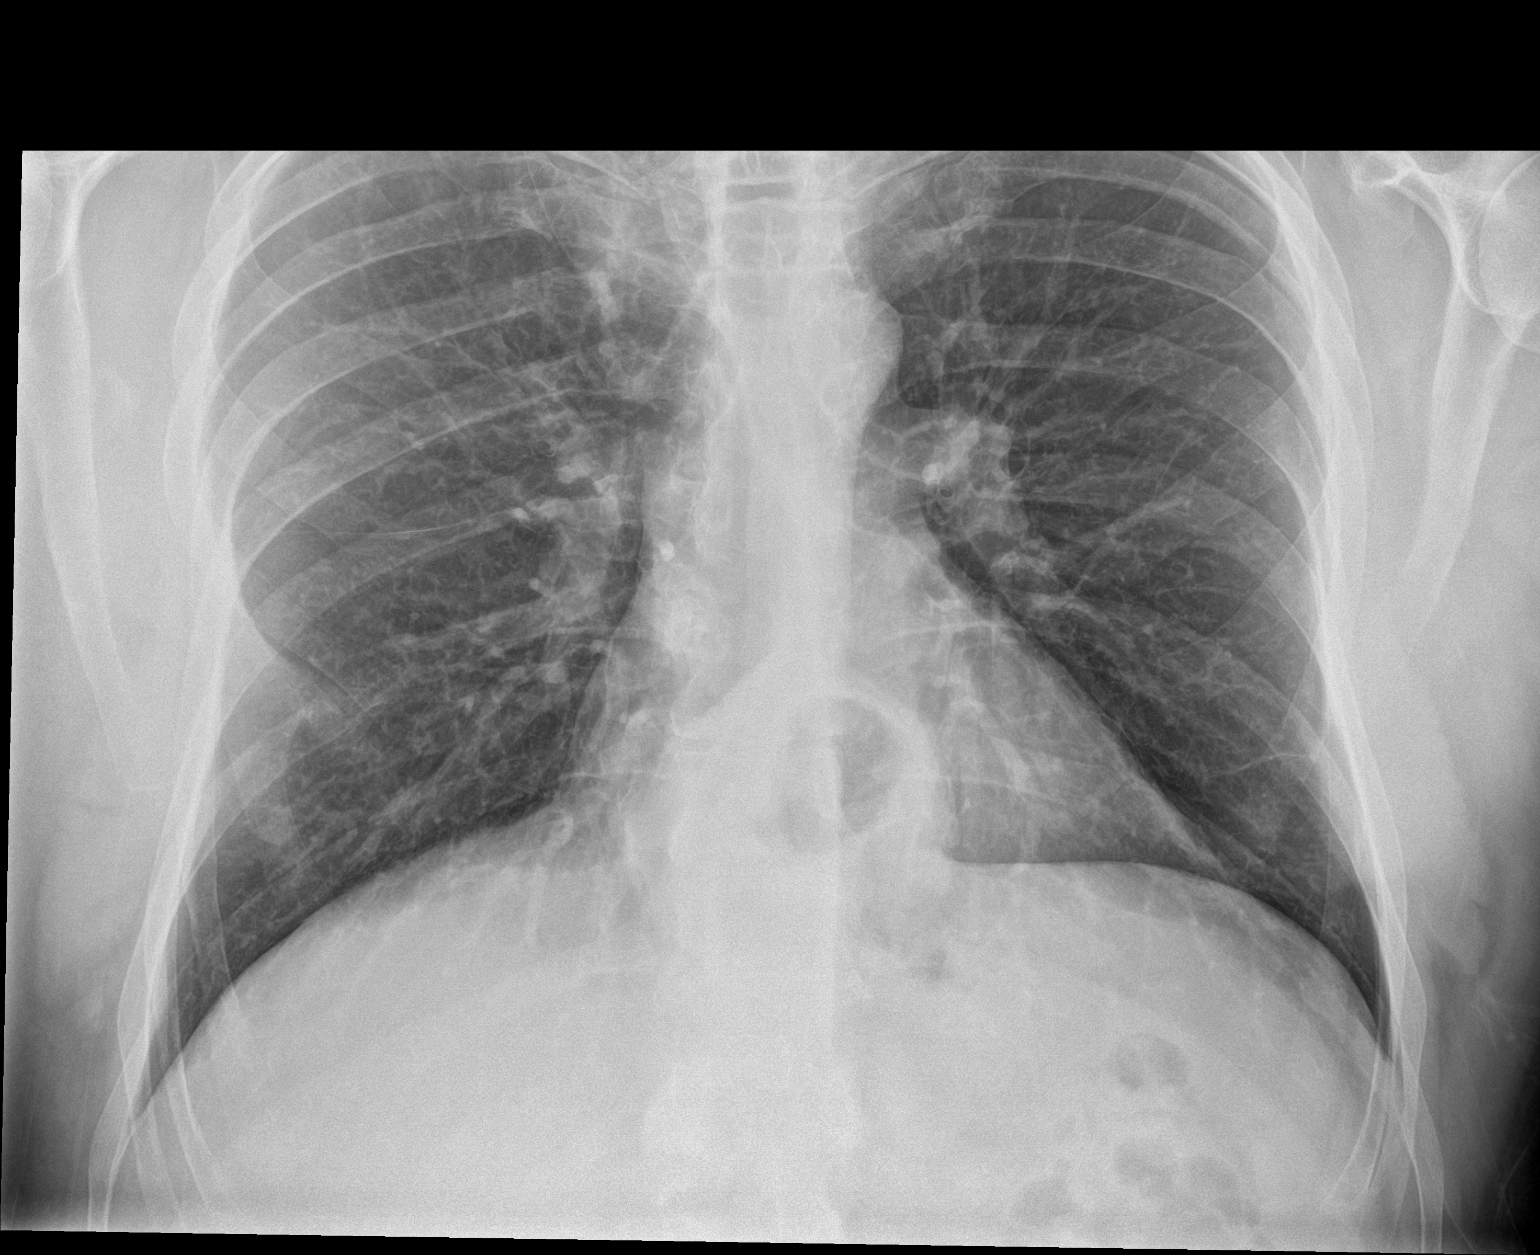

[chest lat]
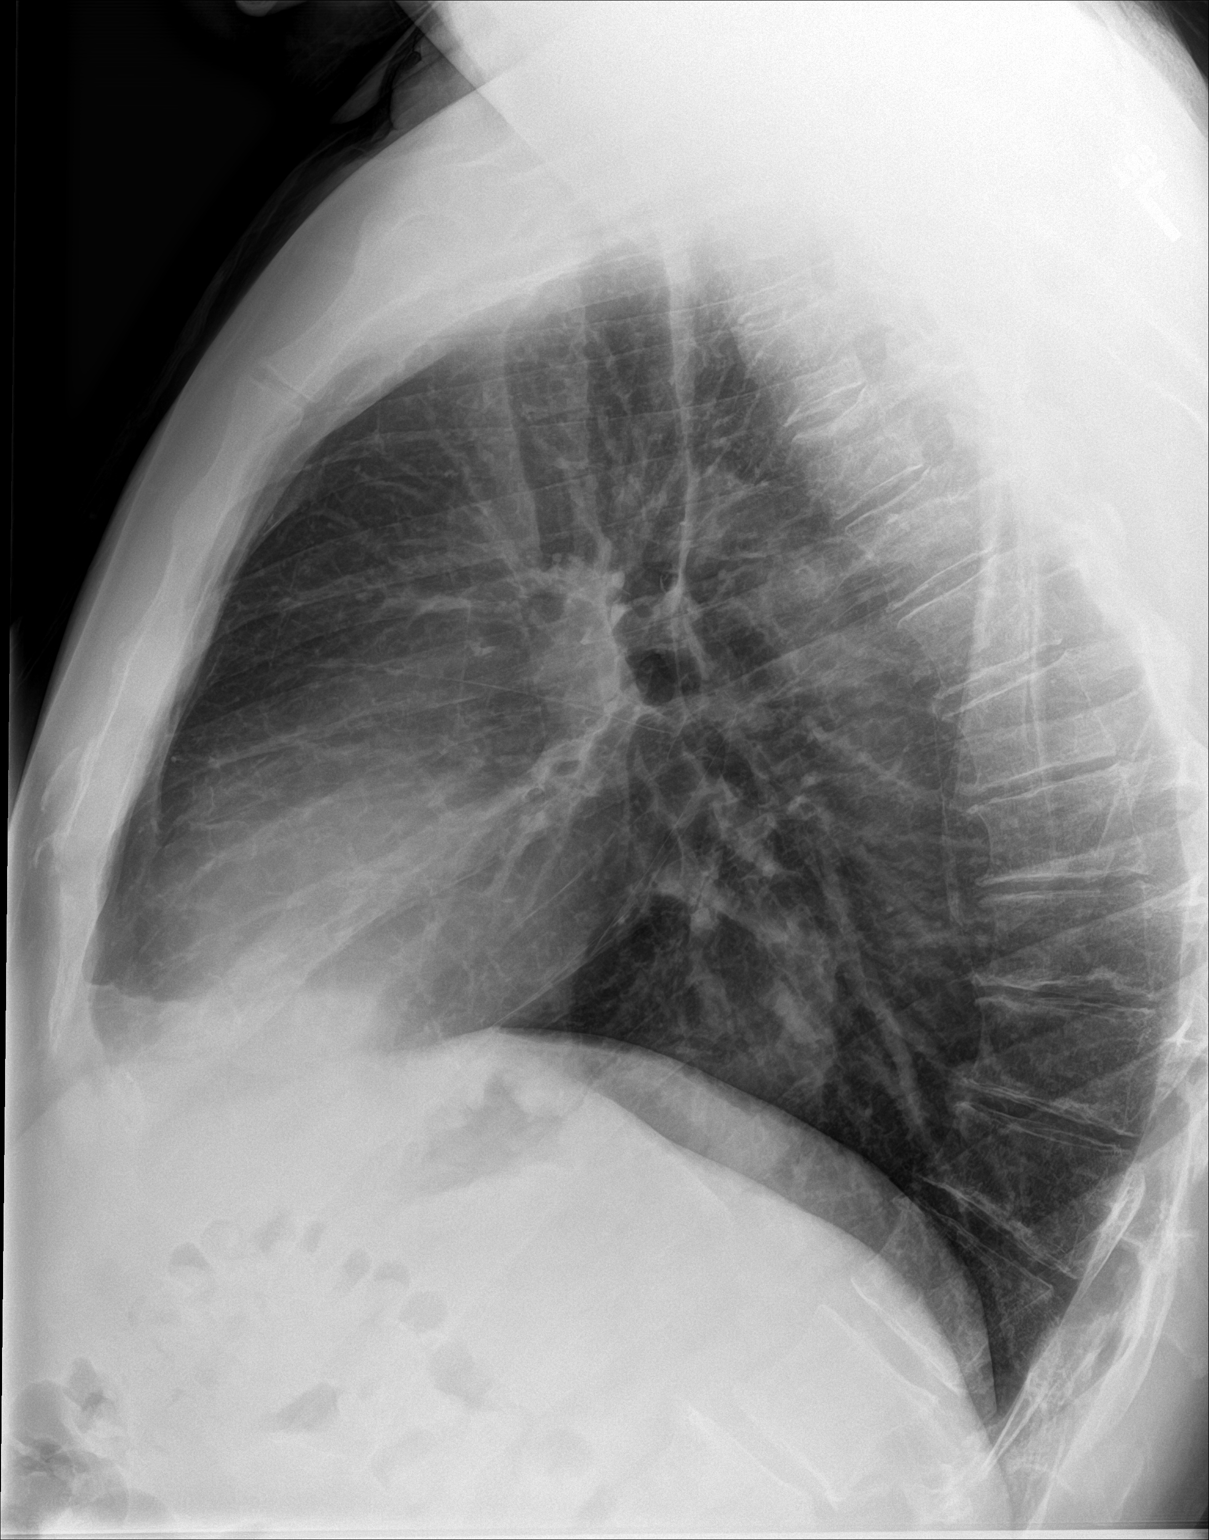

[2 of 2 positions shown; findings below may reference images not displayed]

FINDINGS: Improved aeration with persistent atelectasis adjacent to hiatal
hernia. No pleural effusion. Stable cardiomediastinal contours with
normal heart size. No acute osseous abnormality.
IMPRESSION: Improved lung aeration. Persistent atelectasis adjacent to hiatal
hernia.

## 2021-12-10 ENCOUNTER — Ambulatory Visit: Payer: 59 | Admitting: Cardiology

## 2021-12-10 ENCOUNTER — Other Ambulatory Visit: Payer: Self-pay

## 2021-12-10 ENCOUNTER — Encounter: Payer: Self-pay | Admitting: Cardiology

## 2021-12-10 ENCOUNTER — Telehealth (INDEPENDENT_AMBULATORY_CARE_PROVIDER_SITE_OTHER): Payer: Self-pay

## 2021-12-10 VITALS — BP 126/90 | HR 66 | Ht 75.0 in | Wt 253.0 lb

## 2021-12-10 DIAGNOSIS — I208 Other forms of angina pectoris: Secondary | ICD-10-CM

## 2021-12-10 DIAGNOSIS — I272 Pulmonary hypertension, unspecified: Secondary | ICD-10-CM

## 2021-12-10 DIAGNOSIS — E782 Mixed hyperlipidemia: Secondary | ICD-10-CM

## 2021-12-10 DIAGNOSIS — R0609 Other forms of dyspnea: Secondary | ICD-10-CM

## 2021-12-10 DIAGNOSIS — I2089 Other forms of angina pectoris: Secondary | ICD-10-CM

## 2021-12-10 LAB — BASIC METABOLIC PANEL
BUN/Creatinine Ratio: 10 (ref 9–20)
BUN: 14 mg/dL (ref 6–24)
CO2: 24 mmol/L (ref 20–29)
Calcium: 9.9 mg/dL (ref 8.7–10.2)
Chloride: 104 mmol/L (ref 96–106)
Creatinine, Ser: 1.35 mg/dL — ABNORMAL HIGH (ref 0.76–1.27)
Glucose: 91 mg/dL (ref 70–99)
Potassium: 4.4 mmol/L (ref 3.5–5.2)
Sodium: 141 mmol/L (ref 134–144)
eGFR: 68 mL/min/{1.73_m2} (ref >59)

## 2021-12-10 LAB — CBC
Hematocrit: 50.6 % (ref 37.5–51.0)
Hemoglobin: 17.2 g/dL (ref 13.0–17.7)
MCH: 29 pg (ref 26.6–33.0)
MCHC: 34 g/dL (ref 31.5–35.7)
MCV: 85 fL (ref 79–97)
Platelets: 232 10*3/uL (ref 150–450)
RBC: 5.94 x10E6/uL — ABNORMAL HIGH (ref 4.14–5.80)
RDW: 12.1 % (ref 11.6–15.4)
WBC: 6.8 10*3/uL (ref 3.4–10.8)

## 2021-12-10 NOTE — H&P (View-Only) (Signed)
Primary Care Provider: Lindell Spar, MD Cardiologist: None Electrophysiologist: None  Clinic Note: Chief Complaint  Patient presents with   New Patient (Initial Visit)   Chest Pain   Shortness of Breath   Headache    ===================================  ASSESSMENT/PLAN   Problem List Items Addressed This Visit       Cardiology Problems   Atypical angina ([place])    Profound exertional dyspnea with minimal exertion associated with extreme fatigue.  With plans for right heart catheterization, I think we do need to consider the possibility of this being an anginal equivalent.  Certainly his lipid panel is reassuring, but for the sake of completion ischemic evaluation is warranted.  He is already going for right heart catheterization.  I recommended adding left heart catheterization with coronary angiography to the right heart catheterization.  Further evaluation and treatment based on results.  If there is evidence of coronary disease, he will follow-up with me post cath, however if there is evidence only of pulm hypertension and no coronary disease, would either follow-up with Dr. Haroldine Laws or simply with pulmonary medicine.      Relevant Orders   EKG 54-[facility]   Basic metabolic panel (Completed)   CBC (Completed)   Mixed hyperlipidemia (Chronic)    Lipid panel as of January 12 looks outstanding.  LDL 25 Toposar 122. Triglycerides are 41-started on Lovaza.  (Pending cardiac catheterization result may want to convert from Lovaza to [place])      Relevant Orders   EKG 12-Lead   Pulmonary HTN ([place]) - Primary (Chronic)    Echocardiogram suggested maybe mildly dilated RV But not really suggestive of significant pulmonary hypertension.  With persistent fatigue and exertional dyspnea, pulmonary medicine has recommended right heart catheterization.  He is also on albuterol and Flonase plus Advair.  With the extent of dyspnea, I have also recommended that we add left heart  catheterization to be procedure.  Shared Decision Making/Informed Consent The risks [stroke (1 in 1000), death (1 in 1000), kidney failure [usually temporary] (1 in 500), bleeding (1 in 200), allergic reaction [possibly serious] (1 in 200)], benefits (diagnostic support and management of coronary artery disease) and alternatives of a Right and Left Heart Catheterization were discussed in detail with Elijah Shaffer (and his brother) and he/today are willing to proceed.       Relevant Orders   EKG 12-[facility]NT[facility]   Basic metabolic panel (Completed)   CBC (Completed)     Other   Dyspnea on minimal exertion (Chronic)    Profound fatigue and dyspnea with exertion-minimal exertion. To date, data has not really been all that forthcoming.  PFTs not that abnormal, echo borderline normal with only mild RV dilation.  Not always very accurate assessment of pulmonary pressures.  Plan: We will add left heart catheterization to planned right heart catheterization-left and right heart catheterization with coronary angiography and possible PCI.  See informed decision making.      Relevant Orders   EKG 00-[facility]B   Basic metabolic panel (Completed)   CBC (Completed)    ===================================  HPI:    Elijah Shaffer is a 41 y.o. male with a PMH reviewed below (including large hiatal hernia and seasonal allergies) who is being seen today for the evaluation of SHORTNESS OF BREATH/FATIGUE-CONCERN FOR PULMONARY HYPERTENSION at the request of Freddi Starr, MD.  Elijah Shaffer was seen by Dr. Erin Fulling on 10/31/2021; he noted that high-resolution chest CT did not indicate any evidence of interstitial lung disease.  There was a large hiatal hernia noted.  PFTs suggested mild reduction in DLCO with normal spirometry and TLC.  Patient complained of feeling fatigue during PFTs.  Echo with normal EF with mild aortic valve and RV with normal function.  Noted that CK and ANA/RNP Ab were elevated with an  unremarkable remaining inflammatory work-up. 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 Recommended right heart catheterization to exclude Pulmonary Hypertension -> scheduled for right heart catheterization on February 1 with Dr. Haroldine Laws  Recent Hospitalizations: None  Has been referred to rheumatology for possible mixed connective disorder.  Reviewed  CV studies:    The following studies were reviewed today: (if available, images/films reviewed: From Epic Chart or Care Everywhere) Echocardiogram 02/24/2021: EF 60 to 65%.  Normal LV function.  Normal valves.  Mild RV dilation with normal function.  HRCT chest 10/18/2021: Normal heart size.  No pericardial fluid or pericardial calcification.  No error calcification noted in thoracic aorta or coronary arteries.  No pathologic LN large hiatal hernia.  No findings to suggest ILD.  Mild nodules.  Mild hepatic steatosis  PFT showed decreased DLCO despite normal spirometry and normal TLC, FVC, FEV1/FVC space and FEF 25-75.  With mild RV dilation.-Plan is for RHC.   Interval History:   Elijah Shaffer at baseline  has had a chronic issue of nasal congestion as well as dyspnea that is worse with exertion.  He uses Advair along with as needed albuterol.  Occasional dizzy spells.  Also uses Flonase and Zyrtec and Singulair for chronic sinusitis.  He is accompanied by his brother today.  He himself is a very poor historian, and his brother does not help much.  Much redirection required.  He has excessively exertional dyspnea and extreme fatigue with minimal exertion.  He says he cannot work for more than an hour without getting extremely fatigued.  He when this happens his entire body gets tired.  He knows it is extremely hard for her to breathe he is panting for breath. He can get extremely lightheaded with or without the shortness of breath symptoms where  he feels very low "swimmy headed ".  He gets dizzy and off balance.  This can occur with or without standing or sitting.  He really does not feel irregular heartbeats palpitations however.  He just feels lightheaded dizzy.  I asked specifically about having any chest tightness or pressure and he denied having pain.  He says that sometimes it is hard for him to catch his breath and is still tight in his chest.  He denies any PND, orthopnea, or edema.  OSA evaluation pending no true syncope, but has had some near syncope with his lightheadedness.  No significant rapid irregular heartbeats palpitations.  CV Review of Symptoms (Summary) Cardiovascular ROS: positive for - dyspnea on exertion, shortness of breath, and lightheadedness, dizziness-often associated with dyspnea.  Some tightness in the chest associated dyspnea but no "chest pain " negative for - chest pain, edema, irregular heartbeat, loss of consciousness, orthopnea, paroxysmal nocturnal dyspnea, rapid heart rate, or TIA/amaurosis fugax, claudication  REVIEWED OF SYSTEMS   Review of Systems  Constitutional:  Positive for malaise/fatigue. Negative for weight loss.  HENT:  Positive for congestion and sinus pain.        Not currently having significant congestion, but intermittently does have sinus pain and congestion  Respiratory:  Positive for shortness of breath (Per HPI). Negative for  cough (Not currently).   Cardiovascular:        Per HPI  Gastrointestinal:  Positive for heartburn. Negative for abdominal pain, blood in stool, diarrhea and melena.  Musculoskeletal:  Negative for falls, joint pain and myalgias (Just describes that his whole body feels tired with exertion.).  Neurological:  Positive for dizziness. Negative for focal weakness, weakness and headaches (Not really headaches, just lightheadedness.).  Psychiatric/Behavioral:  Negative for depression and memory loss. The patient is nervous/anxious. The patient does not have  insomnia.    I have reviewed and (if needed) personally updated the patient's problem list, medications, allergies, past medical and surgical history, social and family history.   PAST MEDICAL HISTORY   Past Medical History:  Diagnosis Date   Acute renal injury ([place]) 06/28/2017   Anxiety    Aspiration pneumonia of right upper lobe due to gastric secretions ([place])    Diverticulitis    GERD (gastroesophageal reflux disease) 01/08/2018   Seasonal allergies    Syncope 06/26/2021    PAST SURGICAL HISTORY   Past Surgical History:  Procedure Laterality Date   BIOPSY  03/18/2021   Procedure: BIOPSY;  Surgeon: Harvel Quale, MD;  [facility];  Service: Gastroenterology;;  esophageal at [facility]line   BIOPSY  05/23/2021   Procedure: BIOPSY;  Surgeon: Harvel Quale, MD;  [facility];  Service: Gastroenterology;;   COLONOSCOPY N/A 09/22/2017   Procedure: COLONOSCOPY;  Surgeon: Rogene Houston, MD;  [facility];  Service: Endoscopy;  Laterality: N/A;  9:55   COLONOSCOPY WITH PROPOFOL N/A 05/23/2021   Procedure: COLONOSCOPY WITH PROPOFOL;  Surgeon: Harvel Quale, MD;  [facility];  Service: Gastroenterology;  Laterality: N/A;  7:30   ESOPHAGOGASTRODUODENOSCOPY (EGD) WITH PROPOFOL N/A 03/18/2021   Procedure: ESOPHAGOGASTRODUODENOSCOPY (EGD) WITH PROPOFOL;  Surgeon: Harvel Quale, MD;  [facility];  Service: Gastroenterology;  Laterality: N/A;   ESOPHAGOGASTRODUODENOSCOPY (EGD) WITH PROPOFOL N/A 05/23/2021   Procedure: ESOPHAGOGASTRODUODENOSCOPY (EGD) WITH PROPOFOL;  Surgeon: Harvel Quale, MD;  [facility];  Service: Gastroenterology;  Laterality: N/A;   POLYPECTOMY  09/22/2017   Procedure: POLYPECTOMY;  Surgeon: Rogene Houston, MD;  [facility];  Service: Endoscopy;;   TRANSTHORACIC ECHOCARDIOGRAM  02/24/2021   EF 60 to 65%.  Normal LV function.  Normal  valves.  Mild RV dilation with normal function.    Immunization History  Administered Date(s) Administered   Influenza,inj,Quad PF,6+ Mos 09/10/2021   Moderna SARS-COV2 Booster Vaccination 02/11/2020, 03/10/2020, 10/08/2020, 11/09/2021   Tdap 02/08/2017    MEDICATIONS/ALLERGIES   Current Meds  Medication Sig   acetaminophen (TYLENOL) 500 MG tablet Take 500-1,000 mg by mouth every 6 (six) hours as needed for moderate pain or mild pain.   albuterol (VENTOLIN HFA) 108 (90 Base) MCG/ACT inhaler Inhale 2 puffs into the lungs every 6 (six) hours as needed for shortness of breath.   aspirin EC 81 MG tablet Take 81 mg by mouth daily. Swallow whole.   b complex vitamins capsule Take 1 capsule by mouth daily. With D3 gummie   betamethasone dipropionate 0.05 % cream Apply 1 application topically 2 (two) times daily as needed (Rash).   cetirizine (ZYRTEC) 10 MG tablet Take 10 mg by mouth daily.   diphenhydrAMINE-zinc acetate (BENADRYL EXTRA STRENGTH) cream Apply 1 application topically 3 (three) times daily as needed for itching.   FLUoxetine (PROZAC) 10 MG capsule Take 1 capsule (10 mg total) by mouth daily.  fluticasone (FLONASE ALLERGY RELIEF) 50 MCG/ACT nasal spray Place 2 sprays into both nostrils daily as needed for allergies or rhinitis. (Patient taking differently: Place 2 sprays into both nostrils daily.)   guaiFENesin (MUCINEX) 600 MG 12 hr tablet Take 600 mg by mouth 2 (two) times daily as needed for to loosen phlegm.   hydrocortisone 1 % ointment Apply 1 application topically 2 (two) times daily.   hydrOXYzine (ATARAX/VISTARIL) 10 MG tablet Take 1 tablet (10 mg total) by mouth 3 (three) times daily as needed for anxiety.   meclizine (ANTIVERT) 12.5 MG tablet Take 12.5 mg by mouth daily as needed for dizziness.   montelukast (SINGULAIR) 10 MG tablet Take 1 tablet (10 mg total) by mouth at bedtime.   omega-3 acid ethyl esters (LOVAZA) 1 g capsule Take 2 capsules (2 g total) by mouth 2  (two) times daily.   ondansetron (ZOFRAN) 4 MG tablet Take 1 tablet (4 mg total) by mouth every 6 (six) hours as needed for nausea.   pantoprazole (PROTONIX) 40 MG tablet Take 1 tablet (40 mg total) by mouth 2 (two) times daily.   polyethylene glycol (MIRALAX / GLYCOLAX) 17 g packet Take 17 g by mouth daily as needed for severe constipation or moderate constipation.    Allergies  Allergen Reactions   Pollen Extract Other (See Comments)    Seasonal allergies   Claritin [Loratadine] Other (See Comments)    Heart race    SOCIAL HISTORY/FAMILY HISTORY   Reviewed in Epic:  Pertinent findings:  Social History   Tobacco Use   Smoking status: Never   Smokeless tobacco: Never  Vaping Use   Vaping Use: Never used  Substance Use Topics   Alcohol use: Not Currently   Drug use: No   Social History   Social History Narrative   Not on file    OBJCTIVE -PE, EKG, labs   Wt Readings from Last 3 Encounters:  12/10/21 253 lb (114.8 kg)  12/07/21 251 lb 1.9 oz (113.9 kg)  11/27/21 247 lb 1.3 oz (112.1 kg)    Physical Exam: BP 126/90 (BP Location: Left Arm, Patient Position: Sitting, Cuff Size: Large)    Pulse 66    Ht 6\' 3"  (1.905 m)    Wt 253 lb (114.8 kg)    BMI 31.62 kg/m  Physical Exam Vitals reviewed.  Constitutional:      General: He is not in acute distress.    Appearance: He is well-developed. He is obese.     Comments: Well-groomed.  Healthy-appearing.  HENT:     Head: Normocephalic and atraumatic.  Neck:     Vascular: No carotid bruit, hepatojugular reflux or JVD.  Cardiovascular:     Rate and Rhythm: Normal rate and regular rhythm.     Chest Wall: PMI is not displaced.     Pulses: Normal pulses.     Heart sounds: S1 normal and S2 normal. Heart sounds are distant. No murmur heard.   No friction rub. No gallop.  Pulmonary:     Effort: Pulmonary effort is normal. No respiratory distress.     Breath sounds: No wheezing, rhonchi or rales.  Chest:     Chest wall: No  tenderness.  Abdominal:     General: Abdomen is flat. Bowel sounds are normal. There is no distension.     Palpations: Abdomen is soft.     Tenderness: There is no abdominal tenderness.  Musculoskeletal:        General: Swelling (Trivial ankle) present.  Cervical back: Normal range of motion and neck supple.  Skin:    General: Skin is warm and dry.  Neurological:     General: No focal deficit present.     Mental Status: He is alert and oriented to person, place, and time.     Comments: Very poor historian.  Psychiatric:        Mood and Affect: Mood normal.        Behavior: Behavior normal.     Comments: Poor historian.  Seems to have difficulty understanding questions and requires redirection     Adult ECG Report  Rate: 66 ;  Rhythm: normal sinus rhythm and Q in III only (read as CRO Inf MI, age indeterminate.  Poor R wave progression in precordial leads - CRO Anterior MI, age uideterminate.  ;   Narrative Interpretation: Reviewed.  Stable  Recent Labs:  reviewed  Lab Results  Component Value Date   CHOL 122 11/22/2021   HDL 31 (L) 11/22/2021   LDLCALC 25 11/22/2021   TRIG 481 (H) 11/22/2021   CHOLHDL 3.9 11/22/2021   Lab Results  Component Value Date   CREATININE 1.35 (H) 12/10/2021   BUN 14 12/10/2021   NA 141 12/10/2021   K 4.4 12/10/2021   CL 104 12/10/2021   CO2 24 12/10/2021   CBC Latest Ref Rng & Units 12/10/2021 10/02/2021 07/18/2021  WBC 3.4 - 10.8 x10E3/uL 6.8 7.7 7.4  Hemoglobin 13.0 - 17.7 g/dL 17.2 15.9 17.3  Hematocrit 37.5 - 51.0 % 50.6 47.2 50.4  Platelets 150 - 450 x10E3/uL 232 210.0 235    Lab Results  Component Value Date   HGBA1C 5.4 02/24/2021   Lab Results  Component Value Date   TSH 1.440 07/18/2021    ==================================================  COVID-19 Education: The signs and symptoms of COVID-19 were discussed with the patient and how to seek care for testing (follow up with PCP or arrange E-visit).    I spent a total  of 33 minutes with the patient spent in direct patient consultation.  Additional time spent with chart review  / charting (studies, outside notes, etc): 29 min Total Time: 62 min  Current medicines are reviewed at length with the patient today.  (+/- concerns) none  This visit occurred during the SARS-CoV-2 public health emergency.  Safety protocols were in place, including screening questions prior to the visit, additional usage of staff PPE, and extensive cleaning of exam room while observing appropriate contact time as indicated for disinfecting solutions.  Notice: This dictation was prepared with Dragon dictation along with smart phrase technology. Any transcriptional errors that result from this process are unintentional and may not be corrected upon review.  Studies Ordered:   Orders Placed This Encounter  Procedures   Basic metabolic panel   CBC   Informed Consent Details: Physician/Practitioner Attestation; Transcribe to consent form and obtain patient signature   EKG 12-Lead    Patient Instructions / Medication Changes & Studies & Tests Ordered   Shared Decision Making/Informed Consent The risks [stroke (1 in 1000), death (1 in 1000), kidney failure [usually temporary] (1 in 500), bleeding (1 in 200), allergic reaction [possibly serious] (1 in 200)], benefits (diagnostic support and management of coronary artery disease) and alternatives of a Cardiac (Right & Left Heart) Catheterization were discussed in detail with Elijah Shaffer and he is willing to proceed.  Patient Instructions  Medication Instructions:   No changes  *If you need a refill on your cardiac medications before your next  appointment, please call your pharmacy*   Lab Work: CBC BMP If you have labs (blood work) drawn today and your tests are completely normal, you will receive your results only by: [place] (if you have MyChart) OR A paper copy in the mail If you have any lab test that is abnormal or  we need to change your treatment, we will call you to review the results.   Testing/Procedures:  Schedule at  [address]-- Feb 1. 2023 Your physician has requested that you have a  right and left cardiac catheterization. Cardiac catheterization is used to diagnose and/or treat various heart conditions. Doctors may recommend this procedure for a number of different reasons. The most common reason is to evaluate chest pain. Chest pain can be a symptom of coronary artery disease (CAD), and cardiac catheterization can show whether plaque is narrowing or blocking your hearts arteries. This procedure is also used to evaluate the valves, as well as measure the blood flow and oxygen levels in different parts of your heart. For further information please visit HugeFiesta[facility][email or web address]. Please follow instruction sheet, as given.    Follow-Up: At [facility], you and your health needs are our priority.  As part of our continuing mission to provide you with exceptional heart care, we have created designated Provider Care Teams.  These Care Teams include your primary Cardiologist (physician) and Advanced Practice Providers (APPs -  Physician Assistants and Nurse Practitioners) who all work together to provide you with the care you need, when you need it.     Your next appointment:    Will be schedule after  right and left heart catheterization   The format for your next appointment:   In Person  Provider:      Dr Haroldine Laws  or Dr Ellyn Hack    Other Instructions      Glenetta Hew, M.D., M.S. Interventional Cardiologist   Pager # [phone] Phone # [phone] [address] [place], [place] [address]   Thank you for choosing [facility] at [facility]!!

## 2021-12-10 NOTE — Assessment & Plan Note (Signed)
Lipid panel as of January 12 looks outstanding.  LDL 25 Toposar 122. Triglycerides are 41-started on Lovaza.  (Pending cardiac catheterization result may want to convert from Lovaza to [place])

## 2021-12-10 NOTE — Assessment & Plan Note (Signed)
Profound fatigue and dyspnea with exertion-minimal exertion. To date, data has not really been all that forthcoming.  PFTs not that abnormal, echo borderline normal with only mild RV dilation.  Not always very accurate assessment of pulmonary pressures.  Plan: We will add left heart catheterization to planned right heart catheterization-left and right heart catheterization with coronary angiography and possible PCI.  See informed decision making.

## 2021-12-10 NOTE — Assessment & Plan Note (Signed)
Profound exertional dyspnea with minimal exertion associated with extreme fatigue.  With plans for right heart catheterization, I think we do need to consider the possibility of this being an anginal equivalent.  Certainly his lipid panel is reassuring, but for the sake of completion ischemic evaluation is warranted.  He is already going for right heart catheterization.  I recommended adding left heart catheterization with coronary angiography to the right heart catheterization.  Further evaluation and treatment based on results.  If there is evidence of coronary disease, he will follow-up with me post cath, however if there is evidence only of pulm hypertension and no coronary disease, would either follow-up with Dr. Haroldine Laws or simply with pulmonary medicine.

## 2021-12-10 NOTE — Patient Instructions (Addendum)
Medication Instructions:   No changes  *If you need a refill on your cardiac medications before your next appointment, please call your pharmacy*   Lab Work: CBC BMP If you have labs (blood work) drawn today and your tests are completely normal, you will receive your results only by: [place] (if you have MyChart) OR A paper copy in the mail If you have any lab test that is abnormal or we need to change your treatment, we will call you to review the results.   Testing/Procedures:  Schedule at  [address]-- Feb 1. 2023 Your physician has requested that you have a  right and left cardiac catheterization. Cardiac catheterization is used to diagnose and/or treat various heart conditions. Doctors may recommend this procedure for a number of different reasons. The most common reason is to evaluate chest pain. Chest pain can be a symptom of coronary artery disease (CAD), and cardiac catheterization can show whether plaque is narrowing or blocking your hearts arteries. This procedure is also used to evaluate the valves, as well as measure the blood flow and oxygen levels in different parts of your heart. For further information please visit HugeFiesta[facility][email or web address]. Please follow instruction sheet, as given.    Follow-Up: At [facility], you and your health needs are our priority.  As part of our continuing mission to provide you with exceptional heart care, we have created designated Provider Care Teams.  These Care Teams include your primary Cardiologist (physician) and Advanced Practice Providers (APPs -  Physician Assistants and Nurse Practitioners) who all work together to provide you with the care you need, when you need it.     Your next appointment:    Will be schedule after  right and left heart catheterization   The format for your next appointment:   In Person  Provider:      Dr Haroldine Laws  or Dr Ellyn Hack    Other Instructions

## 2021-12-10 NOTE — Progress Notes (Signed)
Primary Care Provider: Lindell Spar, MD Cardiologist: None Electrophysiologist: None  Clinic Note: Chief Complaint  Patient presents with   New Patient (Initial Visit)   Chest Pain   Shortness of Breath   Headache    ===================================  ASSESSMENT/PLAN   Problem List Items Addressed This Visit       Cardiology Problems   Atypical angina ([place])    Profound exertional dyspnea with minimal exertion associated with extreme fatigue.  With plans for right heart catheterization, I think we do need to consider the possibility of this being an anginal equivalent.  Certainly his lipid panel is reassuring, but for the sake of completion ischemic evaluation is warranted.  He is already going for right heart catheterization.  I recommended adding left heart catheterization with coronary angiography to the right heart catheterization.  Further evaluation and treatment based on results.  If there is evidence of coronary disease, he will follow-up with me post cath, however if there is evidence only of pulm hypertension and no coronary disease, would either follow-up with Dr. Haroldine Laws or simply with pulmonary medicine.      Relevant Orders   EKG 44-IHKV   Basic metabolic panel (Completed)   CBC (Completed)   Mixed hyperlipidemia (Chronic)    Lipid panel as of January 12 looks outstanding.  LDL 25 Toposar 122. Triglycerides are 41-started on Lovaza.  (Pending cardiac catheterization result may want to convert from Lovaza to [place])      Relevant Orders   EKG 12-Lead   Pulmonary HTN ([place]) - Primary (Chronic)    Echocardiogram suggested maybe mildly dilated RV But not really suggestive of significant pulmonary hypertension.  With persistent fatigue and exertional dyspnea, pulmonary medicine has recommended right heart catheterization.  He is also on albuterol and Flonase plus Advair.  With the extent of dyspnea, I have also recommended that we add left heart  catheterization to be procedure.  Shared Decision Making/Informed Consent The risks [stroke (1 in 1000), death (1 in 1000), kidney failure [usually temporary] (1 in 500), bleeding (1 in 200), allergic reaction [possibly serious] (1 in 200)], benefits (diagnostic support and management of coronary artery disease) and alternatives of a Right and Left Heart Catheterization were discussed in detail with Elijah Shaffer (and his brother) and he/today are willing to proceed.       Relevant Orders   EKG 42-VZDG   Basic metabolic panel (Completed)   CBC (Completed)     Other   Dyspnea on minimal exertion (Chronic)    Profound fatigue and dyspnea with exertion-minimal exertion. To date, data has not really been all that forthcoming.  PFTs not that abnormal, echo borderline normal with only mild RV dilation.  Not always very accurate assessment of pulmonary pressures.  Plan: We will add left heart catheterization to planned right heart catheterization-left and right heart catheterization with coronary angiography and possible PCI.  See informed decision making.      Relevant Orders   EKG 38-VFIE   Basic metabolic panel (Completed)   CBC (Completed)    ===================================  HPI:    Elijah Shaffer is a 41 y.o. male with a PMH reviewed below (including large hiatal hernia and seasonal allergies) who is being seen today for the evaluation of SHORTNESS OF BREATH/FATIGUE-CONCERN FOR PULMONARY HYPERTENSION at the request of Freddi Starr, MD.  Elijah Shaffer was seen by Dr. Erin Fulling on 10/31/2021; he noted that high-resolution chest CT did not indicate any evidence of interstitial lung disease.  There was a large hiatal hernia noted.  PFTs suggested mild reduction in DLCO with normal spirometry and TLC.  Patient complained of feeling fatigue during PFTs.  Echo with normal EF with mild aortic valve and RV with normal function.  Noted that CK and ANA/RNP Ab were elevated with an  unremarkable remaining inflammatory work-up. 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 Recommended right heart catheterization to exclude Pulmonary Hypertension -> scheduled for right heart catheterization on February 1 with Dr. Haroldine Laws  Recent Hospitalizations: None  Has been referred to rheumatology for possible mixed connective disorder.  Reviewed  CV studies:    The following studies were reviewed today: (if available, images/films reviewed: From Epic Chart or Care Everywhere) Echocardiogram 02/24/2021: EF 60 to 65%.  Normal LV function.  Normal valves.  Mild RV dilation with normal function.  HRCT chest 10/18/2021: Normal heart size.  No pericardial fluid or pericardial calcification.  No error calcification noted in thoracic aorta or coronary arteries.  No pathologic LN large hiatal hernia.  No findings to suggest ILD.  Mild nodules.  Mild hepatic steatosis  PFT showed decreased DLCO despite normal spirometry and normal TLC, FVC, FEV1/FVC space and FEF 25-75.  With mild RV dilation.-Plan is for RHC.   Interval History:   Elijah Shaffer at baseline  has had a chronic issue of nasal congestion as well as dyspnea that is worse with exertion.  He uses Advair along with as needed albuterol.  Occasional dizzy spells.  Also uses Flonase and Zyrtec and Singulair for chronic sinusitis.  He is accompanied by his brother today.  He himself is a very poor historian, and his brother does not help much.  Much redirection required.  He has excessively exertional dyspnea and extreme fatigue with minimal exertion.  He says he cannot work for more than an hour without getting extremely fatigued.  He when this happens his entire body gets tired.  He knows it is extremely hard for her to breathe he is panting for breath. He can get extremely lightheaded with or without the shortness of breath symptoms where  he feels very low "swimmy headed ".  He gets dizzy and off balance.  This can occur with or without standing or sitting.  He really does not feel irregular heartbeats palpitations however.  He just feels lightheaded dizzy.  I asked specifically about having any chest tightness or pressure and he denied having pain.  He says that sometimes it is hard for him to catch his breath and is still tight in his chest.  He denies any PND, orthopnea, or edema.  OSA evaluation pending no true syncope, but has had some near syncope with his lightheadedness.  No significant rapid irregular heartbeats palpitations.  CV Review of Symptoms (Summary) Cardiovascular ROS: positive for - dyspnea on exertion, shortness of breath, and lightheadedness, dizziness-often associated with dyspnea.  Some tightness in the chest associated dyspnea but no "chest pain " negative for - chest pain, edema, irregular heartbeat, loss of consciousness, orthopnea, paroxysmal nocturnal dyspnea, rapid heart rate, or TIA/amaurosis fugax, claudication  REVIEWED OF SYSTEMS   Review of Systems  Constitutional:  Positive for malaise/fatigue. Negative for weight loss.  HENT:  Positive for congestion and sinus pain.        Not currently having significant congestion, but intermittently does have sinus pain and congestion  Respiratory:  Positive for shortness of breath (Per HPI). Negative for  cough (Not currently).   Cardiovascular:        Per HPI  Gastrointestinal:  Positive for heartburn. Negative for abdominal pain, blood in stool, diarrhea and melena.  Musculoskeletal:  Negative for falls, joint pain and myalgias (Just describes that his whole body feels tired with exertion.).  Neurological:  Positive for dizziness. Negative for focal weakness, weakness and headaches (Not really headaches, just lightheadedness.).  Psychiatric/Behavioral:  Negative for depression and memory loss. The patient is nervous/anxious. The patient does not have  insomnia.    I have reviewed and (if needed) personally updated the patient's problem list, medications, allergies, past medical and surgical history, social and family history.   PAST MEDICAL HISTORY   Past Medical History:  Diagnosis Date   Acute renal injury ([place]) 06/28/2017   Anxiety    Aspiration pneumonia of right upper lobe due to gastric secretions ([place])    Diverticulitis    GERD (gastroesophageal reflux disease) 01/08/2018   Seasonal allergies    Syncope 06/26/2021    PAST SURGICAL HISTORY   Past Surgical History:  Procedure Laterality Date   BIOPSY  03/18/2021   Procedure: BIOPSY;  Surgeon: Harvel Quale, MD;  [facility];  Service: Gastroenterology;;  esophageal at [facility]   BIOPSY  05/23/2021   Procedure: BIOPSY;  Surgeon: Harvel Quale, MD;  [facility];  Service: Gastroenterology;;   COLONOSCOPY N/A 09/22/2017   Procedure: COLONOSCOPY;  Surgeon: Rogene Houston, MD;  [facility];  Service: Endoscopy;  Laterality: N/A;  9:55   COLONOSCOPY WITH PROPOFOL N/A 05/23/2021   Procedure: COLONOSCOPY WITH PROPOFOL;  Surgeon: Harvel Quale, MD;  [facility];  Service: Gastroenterology;  Laterality: N/A;  7:30   ESOPHAGOGASTRODUODENOSCOPY (EGD) WITH PROPOFOL N/A 03/18/2021   Procedure: ESOPHAGOGASTRODUODENOSCOPY (EGD) WITH PROPOFOL;  Surgeon: Harvel Quale, MD;  [facility];  Service: Gastroenterology;  Laterality: N/A;   ESOPHAGOGASTRODUODENOSCOPY (EGD) WITH PROPOFOL N/A 05/23/2021   Procedure: ESOPHAGOGASTRODUODENOSCOPY (EGD) WITH PROPOFOL;  Surgeon: Harvel Quale, MD;  [facility];  Service: Gastroenterology;  Laterality: N/A;   POLYPECTOMY  09/22/2017   Procedure: POLYPECTOMY;  Surgeon: Rogene Houston, MD;  [facility];  Service: Endoscopy;;   TRANSTHORACIC ECHOCARDIOGRAM  02/24/2021   EF 60 to 65%.  Normal LV function.  Normal  valves.  Mild RV dilation with normal function.    Immunization History  Administered Date(s) Administered   Influenza,inj,Quad PF,6+ Mos 09/10/2021   Moderna SARS-COV2 Booster Vaccination 02/11/2020, 03/10/2020, 10/08/2020, 11/09/2021   Tdap 02/08/2017    MEDICATIONS/ALLERGIES   Current Meds  Medication Sig   acetaminophen (TYLENOL) 500 MG tablet Take 500-1,000 mg by mouth every 6 (six) hours as needed for moderate pain or mild pain.   albuterol (VENTOLIN HFA) 108 (90 Base) MCG/ACT inhaler Inhale 2 puffs into the lungs every 6 (six) hours as needed for shortness of breath.   aspirin EC 81 MG tablet Take 81 mg by mouth daily. Swallow whole.   b complex vitamins capsule Take 1 capsule by mouth daily. With D3 gummie   betamethasone dipropionate 0.05 % cream Apply 1 application topically 2 (two) times daily as needed ([place]).   cetirizine (ZYRTEC) 10 MG tablet Take 10 mg by mouth daily.   diphenhydrAMINE-zinc acetate (BENADRYL EXTRA STRENGTH) cream Apply 1 application topically 3 (three) times daily as needed for itching.   FLUoxetine (PROZAC) 10 MG capsule Take 1 capsule (10 mg total) by mouth daily.  fluticasone (FLONASE ALLERGY RELIEF) 50 MCG/ACT nasal spray Place 2 sprays into both nostrils daily as needed for allergies or rhinitis. (Patient taking differently: Place 2 sprays into both nostrils daily.)   guaiFENesin (MUCINEX) 600 MG 12 hr tablet Take 600 mg by mouth 2 (two) times daily as needed for to loosen phlegm.   hydrocortisone 1 % ointment Apply 1 application topically 2 (two) times daily.   hydrOXYzine (ATARAX/VISTARIL) 10 MG tablet Take 1 tablet (10 mg total) by mouth 3 (three) times daily as needed for anxiety.   meclizine (ANTIVERT) 12.5 MG tablet Take 12.5 mg by mouth daily as needed for dizziness.   montelukast (SINGULAIR) 10 MG tablet Take 1 tablet (10 mg total) by mouth at bedtime.   omega-3 acid ethyl esters (LOVAZA) 1 g capsule Take 2 capsules (2 g total) by mouth 2  (two) times daily.   ondansetron (ZOFRAN) 4 MG tablet Take 1 tablet (4 mg total) by mouth every 6 (six) hours as needed for nausea.   pantoprazole (PROTONIX) 40 MG tablet Take 1 tablet (40 mg total) by mouth 2 (two) times daily.   polyethylene glycol (MIRALAX / GLYCOLAX) 17 g packet Take 17 g by mouth daily as needed for severe constipation or moderate constipation.    Allergies  Allergen Reactions   Pollen Extract Other (See Comments)    Seasonal allergies   Claritin [Loratadine] Other (See Comments)    Heart race    SOCIAL HISTORY/FAMILY HISTORY   Reviewed in Epic:  Pertinent findings:  Social History   Tobacco Use   Smoking status: Never   Smokeless tobacco: Never  Vaping Use   Vaping Use: Never used  Substance Use Topics   Alcohol use: Not Currently   Drug use: No   Social History   Social History Narrative   Not on file    OBJCTIVE -PE, EKG, labs   Wt Readings from Last 3 Encounters:  12/10/21 253 lb (114.8 kg)  12/07/21 251 lb 1.9 oz (113.9 kg)  11/27/21 247 lb 1.3 oz (112.1 kg)    Physical Exam: BP 126/90 (BP Location: Left Arm, Patient Position: Sitting, Cuff Size: Large)    Pulse 66    Ht 6\' 3"  (1.905 m)    Wt 253 lb (114.8 kg)    BMI 31.62 kg/m  Physical Exam Vitals reviewed.  Constitutional:      General: He is not in acute distress.    Appearance: He is well-developed. He is obese.     Comments: Well-groomed.  Healthy-appearing.  HENT:     Head: Normocephalic and atraumatic.  Neck:     Vascular: No carotid bruit, hepatojugular reflux or JVD.  Cardiovascular:     Rate and Rhythm: Normal rate and regular rhythm.     Chest Wall: PMI is not displaced.     Pulses: Normal pulses.     Heart sounds: S1 normal and S2 normal. Heart sounds are distant. No murmur heard.   No friction rub. No gallop.  Pulmonary:     Effort: Pulmonary effort is normal. No respiratory distress.     Breath sounds: No wheezing, rhonchi or rales.  Chest:     Chest wall: No  tenderness.  Abdominal:     General: Abdomen is flat. Bowel sounds are normal. There is no distension.     Palpations: Abdomen is soft.     Tenderness: There is no abdominal tenderness.  Musculoskeletal:        General: Swelling (Trivial ankle) present.  Cervical back: Normal range of motion and neck supple.  Skin:    General: Skin is warm and dry.  Neurological:     General: No focal deficit present.     Mental Status: He is alert and oriented to person, place, and time.     Comments: Very poor historian.  Psychiatric:        Mood and Affect: Mood normal.        Behavior: Behavior normal.     Comments: Poor historian.  Seems to have difficulty understanding questions and requires redirection     Adult ECG Report  Rate: 66 ;  Rhythm: normal sinus rhythm and Q in III only (read as CRO Inf MI, age indeterminate.  Poor R wave progression in precordial leads - CRO Anterior MI, age uideterminate.  ;   Narrative Interpretation: Reviewed.  Stable  Recent Labs:  reviewed  Lab Results  Component Value Date   CHOL 122 11/22/2021   HDL 31 (L) 11/22/2021   LDLCALC 25 11/22/2021   TRIG 481 (H) 11/22/2021   CHOLHDL 3.9 11/22/2021   Lab Results  Component Value Date   CREATININE 1.35 (H) 12/10/2021   BUN 14 12/10/2021   NA 141 12/10/2021   K 4.4 12/10/2021   CL 104 12/10/2021   CO2 24 12/10/2021   CBC Latest Ref Rng & Units 12/10/2021 10/02/2021 07/18/2021  WBC 3.4 - 10.8 x10E3/uL 6.8 7.7 7.4  Hemoglobin 13.0 - 17.7 g/dL 17.2 15.9 17.3  Hematocrit 37.5 - 51.0 % 50.6 47.2 50.4  Platelets 150 - 450 x10E3/uL 232 210.0 235    Lab Results  Component Value Date   HGBA1C 5.4 02/24/2021   Lab Results  Component Value Date   TSH 1.440 07/18/2021    ==================================================  COVID-19 Education: The signs and symptoms of COVID-19 were discussed with the patient and how to seek care for testing (follow up with PCP or arrange E-visit).    I spent a total  of 33 minutes with the patient spent in direct patient consultation.  Additional time spent with chart review  / charting (studies, outside notes, etc): 29 min Total Time: 62 min  Current medicines are reviewed at length with the patient today.  (+/- concerns) none  This visit occurred during the SARS-CoV-2 public health emergency.  Safety protocols were in place, including screening questions prior to the visit, additional usage of staff PPE, and extensive cleaning of exam room while observing appropriate contact time as indicated for disinfecting solutions.  Notice: This dictation was prepared with Dragon dictation along with smart phrase technology. Any transcriptional errors that result from this process are unintentional and may not be corrected upon review.  Studies Ordered:   Orders Placed This Encounter  Procedures   Basic metabolic panel   CBC   Informed Consent Details: Physician/Practitioner Attestation; Transcribe to consent form and obtain patient signature   EKG 12-Lead    Patient Instructions / Medication Changes & Studies & Tests Ordered   Shared Decision Making/Informed Consent The risks [stroke (1 in 1000), death (1 in 1000), kidney failure [usually temporary] (1 in 500), bleeding (1 in 200), allergic reaction [possibly serious] (1 in 200)], benefits (diagnostic support and management of coronary artery disease) and alternatives of a Cardiac (Right & Left Heart) Catheterization were discussed in detail with Elijah Shaffer and he is willing to proceed.  Patient Instructions  Medication Instructions:   No changes  *If you need a refill on your cardiac medications before your next  appointment, please call your pharmacy*   Lab Work: CBC BMP If you have labs (blood work) drawn today and your tests are completely normal, you will receive your results only by: [place] (if you have MyChart) OR A paper copy in the mail If you have any lab test that is abnormal or  we need to change your treatment, we will call you to review the results.   Testing/Procedures:  Schedule at  [address]-- Feb 1. 2023 Your physician has requested that you have a  right and left cardiac catheterization. Cardiac catheterization is used to diagnose and/or treat various heart conditions. Doctors may recommend this procedure for a number of different reasons. The most common reason is to evaluate chest pain. Chest pain can be a symptom of coronary artery disease (CAD), and cardiac catheterization can show whether plaque is narrowing or blocking your hearts arteries. This procedure is also used to evaluate the valves, as well as measure the blood flow and oxygen levels in different parts of your heart. For further information please visit [email or web address][facility][email or web address]. Please follow instruction sheet, as given.    Follow-Up: At [facility], you and your health needs are our priority.  As part of our continuing mission to provide you with exceptional heart care, we have created designated Provider Care Teams.  These Care Teams include your primary Cardiologist (physician) and Advanced Practice Providers (APPs -  Physician Assistants and Nurse Practitioners) who all work together to provide you with the care you need, when you need it.     Your next appointment:    Will be schedule after  right and left heart catheterization   The format for your next appointment:   In Person  Provider:      Dr Haroldine Laws  or Dr Ellyn Hack    Other Instructions      Glenetta Hew, M.D., M.S. Interventional Cardiologist   Pager # [phone] Phone # [phone] [address] [place], [place] [address]   Thank you for choosing Heart[facility] at [facility]!!

## 2021-12-10 NOTE — Assessment & Plan Note (Addendum)
Echocardiogram suggested maybe mildly dilated RV But not really suggestive of significant pulmonary hypertension.  With persistent fatigue and exertional dyspnea, pulmonary medicine has recommended right heart catheterization.  He is also on albuterol and Flonase plus Advair.  With the extent of dyspnea, I have also recommended that we add left heart catheterization to be procedure.  Shared Decision Making/Informed Consent The risks [stroke (1 in 1000), death (1 in 1000), kidney failure [usually temporary] (1 in 500), bleeding (1 in 200), allergic reaction [possibly serious] (1 in 200)], benefits (diagnostic support and management of coronary artery disease) and alternatives of a Right and Left Heart Catheterization were discussed in detail with Elijah Shaffer (and his brother) and he/today are willing to proceed.

## 2021-12-10 NOTE — Telephone Encounter (Signed)
I received a Prior authorization on Pantoprazole 40 mg bid today 12/10/2021. I noticed the patient had this prescribed to him on 05/23/2021, when I went to the last office note from 11/22/2021 the note states to continue on Omeprazole 40 mg bid. Patient states he has not been on omeprazole and he has checked with [facility] whom states they have no record of omeprazole. Patient states he has not had well control with the Pantoprazole,but was unaware if it was changed to omeprazole. Please advise what the patient is supposed to be on. He states he has enough Pantoprazole to last until Dr. Colman Cater return next week.

## 2021-12-12 ENCOUNTER — Telehealth: Payer: Self-pay | Admitting: Pulmonary Disease

## 2021-12-12 ENCOUNTER — Ambulatory Visit (HOSPITAL_COMMUNITY)
Admission: RE | Admit: 2021-12-12 | Discharge: 2021-12-12 | Disposition: A | Discharge status: Home / Self Care | Payer: 59 | Attending: Internal Medicine | Admitting: Internal Medicine

## 2021-12-12 ENCOUNTER — Ambulatory Visit (HOSPITAL_COMMUNITY)
Admission: RE | Admit: 2021-12-12 | Discharge: 2021-12-12 | Disposition: A | Discharge status: Home / Self Care | Payer: 59 | Source: Ambulatory Visit | Attending: Internal Medicine | Admitting: Internal Medicine

## 2021-12-12 ENCOUNTER — Ambulatory Visit: Payer: 59

## 2021-12-12 ENCOUNTER — Other Ambulatory Visit (HOSPITAL_COMMUNITY): Payer: Self-pay | Admitting: Internal Medicine

## 2021-12-12 ENCOUNTER — Encounter (HOSPITAL_COMMUNITY): Payer: Self-pay | Admitting: Internal Medicine

## 2021-12-12 ENCOUNTER — Encounter (HOSPITAL_COMMUNITY)
Admission: RE | Disposition: A | Discharge status: Home / Self Care | Payer: Self-pay | Source: Home / Self Care | Attending: Internal Medicine

## 2021-12-12 ENCOUNTER — Other Ambulatory Visit: Payer: Self-pay

## 2021-12-12 ENCOUNTER — Other Ambulatory Visit (INDEPENDENT_AMBULATORY_CARE_PROVIDER_SITE_OTHER): Payer: Self-pay | Admitting: *Deleted

## 2021-12-12 DIAGNOSIS — Z79899 Other long term (current) drug therapy: Secondary | ICD-10-CM | POA: Diagnosis not present

## 2021-12-12 DIAGNOSIS — I471 Supraventricular tachycardia: Secondary | ICD-10-CM

## 2021-12-12 DIAGNOSIS — G4733 Obstructive sleep apnea (adult) (pediatric): Secondary | ICD-10-CM | POA: Diagnosis not present

## 2021-12-12 DIAGNOSIS — R0609 Other forms of dyspnea: Secondary | ICD-10-CM | POA: Diagnosis not present

## 2021-12-12 DIAGNOSIS — I272 Pulmonary hypertension, unspecified: Secondary | ICD-10-CM | POA: Diagnosis present

## 2021-12-12 DIAGNOSIS — R0981 Nasal congestion: Secondary | ICD-10-CM | POA: Diagnosis not present

## 2021-12-12 DIAGNOSIS — E782 Mixed hyperlipidemia: Secondary | ICD-10-CM | POA: Diagnosis not present

## 2021-12-12 DIAGNOSIS — R5383 Other fatigue: Secondary | ICD-10-CM

## 2021-12-12 DIAGNOSIS — I209 Angina pectoris, unspecified: Secondary | ICD-10-CM | POA: Insufficient documentation

## 2021-12-12 DIAGNOSIS — R931 Abnormal findings on diagnostic imaging of heart and coronary circulation: Secondary | ICD-10-CM

## 2021-12-12 HISTORY — PX: RIGHT/LEFT HEART CATH AND CORONARY ANGIOGRAPHY: CATH118266

## 2021-12-12 LAB — POCT I-STAT EG7
Acid-Base Excess: 0 mmol/L (ref 0.0–2.0)
Acid-Base Excess: 0 mmol/L (ref 0.0–2.0)
Acid-base deficit: 3 mmol/L — ABNORMAL HIGH (ref 0.0–2.0)
Bicarbonate: 22.9 mmol/L (ref 20.0–28.0)
Bicarbonate: 25.6 mmol/L (ref 20.0–28.0)
Bicarbonate: 25.8 mmol/L (ref 20.0–28.0)
Calcium, Ion: 1.04 mmol/L — ABNORMAL LOW (ref 1.15–1.40)
Calcium, Ion: 1.27 mmol/L (ref 1.15–1.40)
Calcium, Ion: 1.31 mmol/L (ref 1.15–1.40)
HCT: 43 % (ref 39.0–52.0)
HCT: 45 % (ref 39.0–52.0)
HCT: 48 % (ref 39.0–52.0)
Hemoglobin: 14.6 g/dL (ref 13.0–17.0)
Hemoglobin: 15.3 g/dL (ref 13.0–17.0)
Hemoglobin: 16.3 g/dL (ref 13.0–17.0)
O2 Saturation: 74 %
O2 Saturation: 76 %
O2 Saturation: 76 %
Potassium: 3.4 mmol/L — ABNORMAL LOW (ref 3.5–5.1)
Potassium: 3.7 mmol/L (ref 3.5–5.1)
Potassium: 4.1 mmol/L (ref 3.5–5.1)
Sodium: 141 mmol/L (ref 135–145)
Sodium: 142 mmol/L (ref 135–145)
Sodium: 143 mmol/L (ref 135–145)
TCO2: 24 mmol/L (ref 22–32)
TCO2: 27 mmol/L (ref 22–32)
TCO2: 27 mmol/L (ref 22–32)
pCO2, Ven: 42.4 mmHg — ABNORMAL LOW (ref 44.0–60.0)
pCO2, Ven: 43.5 mmHg — ABNORMAL LOW (ref 44.0–60.0)
pCO2, Ven: 45.8 mmHg (ref 44.0–60.0)
pH, Ven: 7.341 (ref 7.250–7.430)
pH, Ven: 7.359 (ref 7.250–7.430)
pH, Ven: 7.378 (ref 7.250–7.430)
pO2, Ven: 42 mmHg (ref 32.0–45.0)
pO2, Ven: 42 mmHg (ref 32.0–45.0)
pO2, Ven: 42 mmHg (ref 32.0–45.0)

## 2021-12-12 LAB — BASIC METABOLIC PANEL
Anion gap: 10 (ref 5–15)
BUN: 11 mg/dL (ref 6–20)
CO2: 24 mmol/L (ref 22–32)
Calcium: 9.6 mg/dL (ref 8.9–10.3)
Chloride: 104 mmol/L (ref 98–111)
Creatinine, Ser: 1.41 mg/dL — ABNORMAL HIGH (ref 0.61–1.24)
GFR, Estimated: >60 mL/min (ref >60)
Glucose, Bld: 111 mg/dL — ABNORMAL HIGH (ref 70–99)
Potassium: 3.8 mmol/L (ref 3.5–5.1)
Sodium: 138 mmol/L (ref 135–145)

## 2021-12-12 LAB — POCT I-STAT 7, (LYTES, BLD GAS, ICA,H+H)
Acid-base deficit: 4 mmol/L — ABNORMAL HIGH (ref 0.0–2.0)
Bicarbonate: 20.8 mmol/L (ref 20.0–28.0)
Calcium, Ion: 0.89 mmol/L — CL (ref 1.15–1.40)
HCT: 39 % (ref 39.0–52.0)
Hemoglobin: 13.3 g/dL (ref 13.0–17.0)
O2 Saturation: 95 %
Potassium: 2.9 mmol/L — ABNORMAL LOW (ref 3.5–5.1)
Sodium: 148 mmol/L — ABNORMAL HIGH (ref 135–145)
TCO2: 22 mmol/L (ref 22–32)
pCO2 arterial: 35.2 mmHg (ref 32.0–48.0)
pH, Arterial: 7.381 (ref 7.350–7.450)
pO2, Arterial: 78 mmHg — ABNORMAL LOW (ref 83.0–108.0)

## 2021-12-12 SURGERY — RIGHT/LEFT HEART CATH AND CORONARY ANGIOGRAPHY
Anesthesia: LOCAL

## 2021-12-12 MED ORDER — ONDANSETRON HCL 4 MG/2ML IJ SOLN: 4.0000 mg | Freq: Four times a day (QID) | INTRAMUSCULAR | Status: DC | PRN

## 2021-12-12 MED ORDER — HYDRALAZINE HCL 20 MG/ML IJ SOLN: 10.0000 mg | INTRAMUSCULAR | Status: DC | PRN

## 2021-12-12 MED ORDER — SODIUM CHLORIDE 0.9 % IV SOLN: INTRAVENOUS | Status: AC

## 2021-12-12 MED ORDER — SODIUM CHLORIDE 0.9% FLUSH: 3.0000 mL | INTRAVENOUS | Status: DC | PRN

## 2021-12-12 MED ORDER — SODIUM CHLORIDE 0.9 % IV SOLN: INTRAVENOUS | Status: DC

## 2021-12-12 MED ORDER — LIDOCAINE HCL (PF) 1 % IJ SOLN
INTRAMUSCULAR | Status: DC | PRN
  Administered 2021-12-12 (×2): 2 mL

## 2021-12-12 MED ORDER — SODIUM CHLORIDE 0.9 % IV SOLN: 250.0000 mL | INTRAVENOUS | Status: DC | PRN

## 2021-12-12 MED ORDER — ASPIRIN 81 MG PO CHEW
81.0000 mg | CHEWABLE_TABLET | Freq: Once | ORAL | Status: AC
  Administered 2021-12-12: 81 mg via ORAL
  Filled 2021-12-12: qty 1

## 2021-12-12 MED ORDER — MIDAZOLAM HCL 2 MG/2ML IJ SOLN
INTRAMUSCULAR | Status: DC | PRN
  Administered 2021-12-12: 1 mg via INTRAVENOUS

## 2021-12-12 MED ORDER — IOHEXOL 350 MG/ML SOLN
INTRAVENOUS | Status: DC | PRN
  Administered 2021-12-12: 45 mL via INTRA_ARTERIAL

## 2021-12-12 MED ORDER — MIDAZOLAM HCL 2 MG/2ML IJ SOLN
INTRAMUSCULAR | Status: AC
  Filled 2021-12-12: qty 2

## 2021-12-12 MED ORDER — SODIUM CHLORIDE 0.9% FLUSH: 3.0000 mL | Freq: Two times a day (BID) | INTRAVENOUS | Status: DC

## 2021-12-12 MED ORDER — LABETALOL HCL 5 MG/ML IV SOLN: 10.0000 mg | INTRAVENOUS | Status: DC | PRN

## 2021-12-12 MED ORDER — HEPARIN SODIUM (PORCINE) 1000 UNIT/ML IJ SOLN
INTRAMUSCULAR | Status: AC
  Filled 2021-12-12: qty 10

## 2021-12-12 MED ORDER — PANTOPRAZOLE SODIUM 40 MG PO TBEC: 40.0000 mg | DELAYED_RELEASE_TABLET | Freq: Two times a day (BID) | ORAL | 0 refills | Status: DC

## 2021-12-12 MED ORDER — HEPARIN (PORCINE) IN NACL 1000-0.9 UT/500ML-% IV SOLN
INTRAVENOUS | Status: DC | PRN
  Administered 2021-12-12 (×2): 500 mL

## 2021-12-12 MED ORDER — VERAPAMIL HCL 2.5 MG/ML IV SOLN
INTRAVENOUS | Status: AC
  Filled 2021-12-12: qty 2

## 2021-12-12 MED ORDER — HEPARIN SODIUM (PORCINE) 1000 UNIT/ML IJ SOLN
INTRAMUSCULAR | Status: DC | PRN
  Administered 2021-12-12: 5000 [IU] via INTRAVENOUS

## 2021-12-12 MED ORDER — VERAPAMIL HCL 2.5 MG/ML IV SOLN
INTRAVENOUS | Status: DC | PRN
  Administered 2021-12-12: 10 mL via INTRA_ARTERIAL

## 2021-12-12 MED ORDER — FENTANYL CITRATE (PF) 100 MCG/2ML IJ SOLN
INTRAMUSCULAR | Status: AC
  Filled 2021-12-12: qty 2

## 2021-12-12 MED ORDER — HEPARIN (PORCINE) IN NACL 1000-0.9 UT/500ML-% IV SOLN
INTRAVENOUS | Status: AC
  Filled 2021-12-12: qty 1000

## 2021-12-12 MED ORDER — LIDOCAINE HCL (PF) 1 % IJ SOLN
INTRAMUSCULAR | Status: AC
  Filled 2021-12-12: qty 30

## 2021-12-12 MED ORDER — ACETAMINOPHEN 325 MG PO TABS: 650.0000 mg | ORAL_TABLET | ORAL | Status: DC | PRN

## 2021-12-12 MED ORDER — FENTANYL CITRATE (PF) 100 MCG/2ML IJ SOLN
INTRAMUSCULAR | Status: DC | PRN
  Administered 2021-12-12: 25 ug via INTRAVENOUS

## 2021-12-12 SURGICAL SUPPLY — 12 items
CATH 5FR JL3.5 JR4 ANG PIG MP (CATHETERS) ×1 IMPLANT
CATH BALLN WEDGE 5F 110CM (CATHETERS) ×1 IMPLANT
CATH INFINITI 5 FR 3DRC (CATHETERS) ×1 IMPLANT
DEVICE RAD COMP TR BAND LRG (VASCULAR PRODUCTS) ×1 IMPLANT
GLIDESHEATH SLEND SS 6F .021 (SHEATH) ×1 IMPLANT
GUIDEWIRE .025 260CM (WIRE) ×1 IMPLANT
GUIDEWIRE INQWIRE 1.5J.035X260 (WIRE) IMPLANT
INQWIRE 1.5J .035X260CM (WIRE) ×2
PACK CARDIAC CATHETERIZATION (CUSTOM PROCEDURE TRAY) ×3 IMPLANT
SHEATH GLIDE SLENDER 4/5FR (SHEATH) ×1 IMPLANT
TRANSDUCER W/STOPCOCK (MISCELLANEOUS) ×3 IMPLANT
WIRE EMERALD 3MM-J .025X260CM (WIRE) IMPLANT

## 2021-12-12 NOTE — Telephone Encounter (Signed)
No further testing for tick borne illnesses at this time. He has already been empirically treated for his by his primary care team.   We will await the sleep study results and the evaluation by rheumatology.   Thanks, JD

## 2021-12-12 NOTE — Telephone Encounter (Signed)
Left message to return call with pt and refill sent to pharmacy.

## 2021-12-12 NOTE — Progress Notes (Signed)
Zio patch placed onto patient.  All instructions and information reviewed with patient, they verbalize understanding with no questions. 

## 2021-12-12 NOTE — Telephone Encounter (Signed)
Pt was notified to continue pantoprazole bid and will send in after he is discharged from hospital when I enter in an order. Pt verbalized understanding.

## 2021-12-12 NOTE — Telephone Encounter (Signed)
Refill sent to pharm

## 2021-12-12 NOTE — Interval H&P Note (Signed)
History and Physical Interval Note:  12/12/2021 8:28 AM  Elijah Shaffer  has presented today for surgery, with the diagnosis of pulmonary HTN.  The various methods of treatment have been discussed with the patient and family. After consideration of risks, benefits and other options for treatment, the patient has consented to  Procedure(s): RIGHT/LEFT HEART CATH AND CORONARY ANGIOGRAPHY (N/A) and possible coronary angioplasty as a surgical intervention.  The patient's history has been reviewed, patient examined, no change in status, stable for surgery.  I have reviewed the patient's chart and labs.  Questions were answered to the patient's satisfaction.     Nalee Lightle

## 2021-12-12 NOTE — Telephone Encounter (Signed)
Called and spoke with patient to get more information. He stated that he had his right heart cath today and was told by the techs that his heart was healthy and is not the source of his SOB.   He wanted to see Dr. Erin Fulling would be willing to order labs that check for diseases transmitted by ticks. He denies any recent tick bites but did state that he works outside a lot.   He was referred to rheumatology back in December 2022. He will establish with Dr. Estanislado Pandy on 12/17/21.   Dr. Erin Fulling, can you please advise?

## 2021-12-12 NOTE — Telephone Encounter (Signed)
Patient wants additional blood work because he is still having some breathing problems. Specifically blood work regarding to tick diseases because he works outside. Please advise on additional labs. Patient confirmed he can go to [facility] to get labs drawn.

## 2021-12-12 NOTE — Telephone Encounter (Signed)
I went to send in refill and unable to. Patient is in hospital at this time. Will check on this tomorrow.

## 2021-12-14 DIAGNOSIS — G4733 Obstructive sleep apnea (adult) (pediatric): Secondary | ICD-10-CM | POA: Diagnosis not present

## 2021-12-17 ENCOUNTER — Ambulatory Visit (INDEPENDENT_AMBULATORY_CARE_PROVIDER_SITE_OTHER): Payer: 59 | Admitting: Rheumatology

## 2021-12-17 ENCOUNTER — Encounter: Payer: Self-pay | Admitting: Rheumatology

## 2021-12-17 ENCOUNTER — Other Ambulatory Visit: Payer: Self-pay | Admitting: *Deleted

## 2021-12-17 ENCOUNTER — Other Ambulatory Visit: Payer: Self-pay

## 2021-12-17 VITALS — BP 141/94 | HR 68 | Ht 75.0 in | Wt 253.0 lb

## 2021-12-17 DIAGNOSIS — L309 Dermatitis, unspecified: Secondary | ICD-10-CM

## 2021-12-17 DIAGNOSIS — R5383 Other fatigue: Secondary | ICD-10-CM | POA: Diagnosis not present

## 2021-12-17 DIAGNOSIS — K2270 Barrett's esophagus without dysplasia: Secondary | ICD-10-CM | POA: Diagnosis not present

## 2021-12-17 DIAGNOSIS — A7740 Ehrlichiosis, unspecified: Secondary | ICD-10-CM

## 2021-12-17 DIAGNOSIS — R29818 Other symptoms and signs involving the nervous system: Secondary | ICD-10-CM

## 2021-12-17 DIAGNOSIS — E782 Mixed hyperlipidemia: Secondary | ICD-10-CM

## 2021-12-17 DIAGNOSIS — R42 Dizziness and giddiness: Secondary | ICD-10-CM

## 2021-12-17 DIAGNOSIS — Z860100 Personal history of colon polyps, unspecified: Secondary | ICD-10-CM

## 2021-12-17 DIAGNOSIS — I272 Pulmonary hypertension, unspecified: Secondary | ICD-10-CM

## 2021-12-17 DIAGNOSIS — F419 Anxiety disorder, unspecified: Secondary | ICD-10-CM

## 2021-12-17 DIAGNOSIS — K449 Diaphragmatic hernia without obstruction or gangrene: Secondary | ICD-10-CM | POA: Diagnosis not present

## 2021-12-17 DIAGNOSIS — R748 Abnormal levels of other serum enzymes: Secondary | ICD-10-CM | POA: Diagnosis not present

## 2021-12-17 DIAGNOSIS — R0602 Shortness of breath: Secondary | ICD-10-CM | POA: Diagnosis not present

## 2021-12-17 DIAGNOSIS — K219 Gastro-esophageal reflux disease without esophagitis: Secondary | ICD-10-CM

## 2021-12-17 LAB — CK: Total CK: 105 U/L (ref 44–196)

## 2021-12-18 ENCOUNTER — Telehealth: Payer: Self-pay | Admitting: Internal Medicine

## 2021-12-18 ENCOUNTER — Other Ambulatory Visit: Payer: Self-pay | Admitting: Internal Medicine

## 2021-12-18 ENCOUNTER — Telehealth: Payer: Self-pay | Admitting: *Deleted

## 2021-12-18 DIAGNOSIS — L309 Dermatitis, unspecified: Secondary | ICD-10-CM

## 2021-12-18 MED ORDER — TRIAMCINOLONE ACETONIDE 0.1 % EX CREA: 1.0000 "application " | TOPICAL_CREAM | Freq: Two times a day (BID) | CUTANEOUS | 0 refills | Status: DC

## 2021-12-18 NOTE — Telephone Encounter (Signed)
Called patient but he did not answer. Left message for him to call us back.  

## 2021-12-18 NOTE — Progress Notes (Signed)
CK is normal

## 2021-12-18 NOTE — Telephone Encounter (Signed)
Pt called in regard to skin itching  Pt states that he seen Muscle disease Dr and they diagnosed eczema.   Pt states cream is  not helping   Pt wants to know if its anything that can help    Pt wants a call back to discuss and further explain,    Pt also complaining of miralax not helping with BM's

## 2021-12-18 NOTE — Chronic Care Management (AMB) (Signed)
°  Care Management   Note  12/18/2021 Name: HARTWELL VANDIVER MRN: [id number] DOB: 01/09/1981  KEISHAUN HAZEL is a 41 y.o. year old male who is a primary care patient of Lindell Spar, MD. I reached out to Isaiah Blakes by phone today in response to a referral sent by Mr. Gregary Signs Brucks's primary care provider.   Mr. Lance was given information about care management services today including:  Care management services include personalized support from designated clinical staff supervised by his physician, including individualized plan of care and coordination with other care providers 24/7 contact phone numbers for assistance for urgent and routine care needs. The patient may stop care management services at any time by phone call to the office staff.  Patient agreed to services and verbal consent obtained.   Follow up plan: Telephone appointment with care management team member scheduled for:02/08/22  [facility]  Direct Dial: [phone]

## 2021-12-19 NOTE — Telephone Encounter (Signed)
LVM to let pt k,now medication sent to pharmacy

## 2021-12-21 ENCOUNTER — Encounter: Payer: Self-pay | Admitting: Neurology

## 2021-12-21 ENCOUNTER — Other Ambulatory Visit: Payer: Self-pay

## 2021-12-21 ENCOUNTER — Ambulatory Visit: Payer: 59 | Admitting: Neurology

## 2021-12-21 VITALS — BP 119/82 | HR 85 | Ht 75.0 in | Wt 251.2 lb

## 2021-12-21 DIAGNOSIS — R42 Dizziness and giddiness: Secondary | ICD-10-CM | POA: Diagnosis not present

## 2021-12-21 DIAGNOSIS — R2681 Unsteadiness on feet: Secondary | ICD-10-CM

## 2021-12-21 DIAGNOSIS — M791 Myalgia, unspecified site: Secondary | ICD-10-CM

## 2021-12-21 DIAGNOSIS — R292 Abnormal reflex: Secondary | ICD-10-CM

## 2021-12-21 NOTE — Progress Notes (Signed)
NEUROLOGY FOLLOW UP OFFICE NOTE  Elijah Shaffer [id number]  Assessment/Plan:   Hyperreflexia - this is new since last evaluation Dizziness/gait instability.  No subsequent syncope. Myalgias, dyspnea - unclear etiology Multiple systemic symptoms - headache, fatigue, chills - unclear if related to ehrlichosis or viral illness. Episode of facial asymmetry - given his age and lack of risk factors, I doubt TIA.  Reported right sided numbness but the only thing objectively noted on exam was possible facial droop (only noted when he smiled).  Bell's palsy most likely.   Check MRI of cervical spine with and without contrast to evaluate for myelopathy If unremarkable, will check NCV-EMG. Follow up after testing.  Further recommendations pending results.     Subjective:  Elijah Shaffer is a 41 year old left-handed male who follows up for dizziness/syncope and now as well as elevated CK.  UPDATE: Last seen in September.  He was already undergoing a workup by another neurologist at that time and I recommended following up to continue that workup.  He said he followed up but he didn't really do much.  Still with lightheadedness and SOB but no syncope.  Found to have brisk reflexes on rheumatologist's exam.  Has constipation but otherwise no bowel or bladder dysfunction.   HISTORY: In April 2022, he was admitted to [facility] for left sided facial droop, right sided numbness and abdominal pain.  Did not receive t-PA due to mild symptoms (NIHSS score of 1 due to mild facial palsy).  As per tele-stroke neurologist's note, he did not exhibit facial asymmetry when talking or at rest, only when smiling.  CT head negative for acute findings.  MRI of brain negative for stroke.  MRA of head negative for LVO or stenosis.  Carotid ultrasound negative for stenosis.  Echocardiogram showed EF 60-65% with no cardiac source of embolus.  LDL was 94, Hgb A1c 5.4, HIV negative, Sars Coronovirus 2 negative, UDS  positive only for benzo.  It was decided that patient likely had a Bell's palsy vs TIA.  He was discharged on acyclovir and was supposed to be discharged on ASA and Plavix but appears it was never prescribed.  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  He began not feeling well including headache, chills and fever.  In May, he was outside when he had a recurrence of this and then passed out.  He was told that he was shaking on the ground and was difficult to arouse when EMS arrived.  He vomited and had coffee-ground emesis.  He was admitted to the hospital where he was diagnosed with upper GI bleed and aspiration pneumonia.  He was discharged on PPI and antibiotics.  Since then, he has been to the ED on several occasions for headache, fatigue and fever and chills.  He was diagnosed with viral illness,  acute pharyngitis and dehydration.  One of the labs from the ED in August was positive for ehrlichosis and he was prescribed doxycycline.  He continued to have systemic symptoms such as cough and was prescribed another round of doxycycline.  He has an appointment with ID.  MRI of brain on 07/19/2021 was normal.  For shortness of breath, he was evaluated by pulmonology with normal work up including high-resolution CT chest and PFTs.  He was then evaluated by cardiology who diagnosed pulmonary hypertension.  He was evaluated by GI and diagnosed with Barrett's esophagus, hiatal  hernia and colon polyps. In November, he was found to have an elevated CK of 356.  Other labs include negative ANA with negative ENA panel, negative anti-CCP, ESR 2, CRP <1, negative RF, aldolase 6, negative Myomarker3 panel,, RNP 1.4 He reports some nocturnal cramps in his calves and sometimes involving the entire leg.  No rash or weakness.  No family history of autoimmune disease.  He was referred to rheumatology.  Repeat CK on 12/17/2021 was 105.    PAST MEDICAL  HISTORY: Past Medical History:  Diagnosis Date   Acute renal injury ([place]) 06/28/2017   Anxiety    Aspiration pneumonia of right upper lobe due to gastric secretions (HCC)    Diverticulitis    GERD (gastroesophageal reflux disease) 01/08/2018   Seasonal allergies    Syncope 06/26/2021    MEDICATIONS: Current Outpatient Medications on File Prior to Visit  Medication Sig Dispense Refill   acetaminophen (TYLENOL) 500 MG tablet Take 500-1,000 mg by mouth every 6 (six) hours as needed for moderate pain or mild pain.     albuterol (VENTOLIN HFA) 108 (90 Base) MCG/ACT inhaler Inhale 2 puffs into the lungs every 6 (six) hours as needed for shortness of breath. 8 g 0   aspirin EC 81 MG tablet Take 81 mg by mouth daily. Swallow whole.     b complex vitamins capsule Take 1 capsule by mouth daily. With D3 gummie     betamethasone dipropionate 0.05 % cream Apply 1 application topically 2 (two) times daily as needed (Rash).     cetirizine (ZYRTEC) 10 MG tablet Take 10 mg by mouth daily.     diphenhydrAMINE-zinc acetate (BENADRYL EXTRA STRENGTH) cream Apply 1 application topically 3 (three) times daily as needed for itching. 28.4 g 0   FLUoxetine (PROZAC) 10 MG capsule Take 1 capsule (10 mg total) by mouth daily. 30 capsule 1   fluticasone (FLONASE ALLERGY RELIEF) 50 MCG/ACT nasal spray Place 2 sprays into both nostrils daily as needed for allergies or rhinitis. (Patient taking differently: Place 2 sprays into both nostrils daily.) 9.9 mL 2   fluticasone-salmeterol (ADVAIR DISKUS) 250-50 MCG/ACT AEPB Inhale 1 puff into the lungs in the morning and at bedtime. 60 each 6   guaiFENesin (MUCINEX) 600 MG 12 hr tablet Take 600 mg by mouth 2 (two) times daily as needed for to loosen phlegm.     hydrOXYzine (ATARAX/VISTARIL) 10 MG tablet Take 1 tablet (10 mg total) by mouth 3 (three) times daily as needed for anxiety. 30 tablet 0   meclizine (ANTIVERT) 12.5 MG tablet Take 12.5 mg by mouth daily as needed for  dizziness.     montelukast (SINGULAIR) 10 MG tablet Take 1 tablet (10 mg total) by mouth at bedtime. 30 tablet 11   omega-3 acid ethyl esters (LOVAZA) 1 g capsule Take 2 capsules (2 g total) by mouth 2 (two) times daily. 120 capsule 3   ondansetron (ZOFRAN) 4 MG tablet Take 1 tablet (4 mg total) by mouth every 6 (six) hours as needed for nausea. 20 tablet 0   pantoprazole (PROTONIX) 40 MG tablet Take 1 tablet (40 mg total) by mouth 2 (two) times daily. 180 tablet 0   polyethylene glycol (MIRALAX / GLYCOLAX) 17 g packet Take 17 g by mouth daily as needed for severe constipation or moderate constipation.     triamcinolone cream (KENALOG) 0.1 % Apply 1 application topically 2 (two) times daily. 30 g 0   No current facility-administered medications on file prior to visit.  ALLERGIES: Allergies  Allergen Reactions   Pollen Extract Other (See Comments)    Seasonal allergies   Claritin [Loratadine] Other (See Comments)    Heart race    FAMILY HISTORY: Family History  Problem Relation Age of Onset   Healthy Mother    Cancer Father    Colon cancer Maternal Grandmother    Colon cancer Maternal Grandfather       Objective:  Blood pressure 119/82, pulse 85, height $RemoveBe'6\' 3"'KzMOUNbBn$  (1.905 m), weight 251 lb 3.2 oz (113.9 kg). General: mildly distressed - short of breath.  Patient appears well-groomed.   Head:  Normocephalic/atraumatic Eyes:  Fundi examined but not visualized Neck: supple, no paraspinal tenderness, full range of motion Heart:  Regular rate and rhythm Lungs:  Clear to auscultation bilaterally Back: No paraspinal tenderness Neurological Exam: alert and oriented to person, place, and time.  Speech fluent and not dysarthric, language intact.  CN II-XII intact. Bulk and tone normal, muscle strength 5/5 throughout.  Sensation to light touch intact.  Deep tendon reflexes 3+ throughout, no clonus, Babinski absent, Hoffman absent.  Finger to nose testing intact.  Narrow-based but cautious.  Able to tandem walk.  Romberg with sway.   Metta Clines, DO

## 2021-12-21 NOTE — Patient Instructions (Signed)
MRI of cervical spine with and without contrast Schedule nerve conduction study of right arm and leg (if MRI reveals answer, will cancel) Follow up after testing

## 2021-12-25 NOTE — Progress Notes (Signed)
Office Visit Note  Patient: Elijah Shaffer             Date of Birth: 11-19-1980           MRN: [id number]             PCP: Lindell Spar, MD Referring: Lindell Spar, MD Visit Date: 01/08/2022 Occupation: _0 @  Subjective:  Elevated muscle enzymes  History of Present Illness: Elijah Shaffer is a 41 y.o. male was evaluated initially for elevated muscle enzyme levels, fatigue and generalized weakness.  He states he continues to have some tremors.  He was seen by Dr. Tomi Likens and is a scheduled to have further work-up including MRI of the cervical spine.  He also had a sleep study which showed that he has obstructive sleep apnea.  He was recently evaluated by Dr. Erin Fulling his high-resolution CT was negative for ILD.  Dr. August Albino concern is that his symptoms are coming from obstructive sleep apnea.  He was also evaluated by infectious disease (Dr. Juleen China) today.  He does not think that his symptoms are related to [facility] spotted fever.  Although he was empirically treated with antibiotics.  He had cardiology and GI work-up.  He states he continues to have some episodes of dizziness.  He is not able to drive because of dizziness and tremors.  Activities of Daily Living:  Patient reports morning stiffness for 0 minutes.   Patient Reports nocturnal pain.  Difficulty dressing/grooming: Denies Difficulty climbing stairs: Denies Difficulty getting out of chair: Denies Difficulty using hands for taps, buttons, cutlery, and/or writing: Denies  Review of Systems  Constitutional:  Positive for fatigue.  HENT:  Negative for mouth sores, mouth dryness and nose dryness.   Eyes:  Positive for itching and visual disturbance. Negative for pain and dryness.  Respiratory:  Positive for shortness of breath and difficulty breathing.   Cardiovascular:  Negative for chest pain and palpitations.  Gastrointestinal:  Negative for blood in stool, constipation and diarrhea.  Endocrine: Negative for  increased urination.  Genitourinary:  Negative for difficulty urinating.  Musculoskeletal:  Positive for myalgias and myalgias. Negative for joint pain, joint pain, joint swelling, morning stiffness and muscle tenderness.  Skin:  Positive for rash. Negative for color change and sensitivity to sunlight.  Allergic/Immunologic: Negative for susceptible to infections.  Neurological:  Positive for tremors, light-headedness, headaches and weakness. Negative for numbness and memory loss.  Hematological:  Positive for bruising/bleeding tendency.  Psychiatric/Behavioral:  Positive for sleep disturbance. Negative for confusion.    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   Anal itching 12/07/2021   Chronic sinusitis 12/04/2021   Mixed hyperlipidemia 11/30/2021   Esophageal hiatal hernia 11/22/2021   Constipation 11/22/2021   Dyspnea on minimal exertion 81/19/1478   Ehrlichiosis 29/56/2130   Anxiety 06/26/2021   Eczema 06/26/2021   Barrett's esophagus 04/11/2021   History of colonic polyps 04/11/2021   Focal neurological deficit 02/23/2021   GERD (gastroesophageal reflux disease) 01/08/2018    Past Medical History:  Diagnosis Date   Acute renal injury ([place]) 06/28/2017   Anxiety    Aspiration pneumonia of right upper lobe due to gastric secretions (HCC)    Diverticulitis    GERD (gastroesophageal reflux disease) 01/08/2018   Seasonal allergies    Syncope 06/26/2021    Family History  Problem Relation Age of Onset   Healthy Mother    Cancer Father    Colon cancer Maternal Grandmother    Colon cancer Maternal Grandfather    Past Surgical History:  Procedure Laterality Date   BIOPSY  03/18/2021   Procedure: BIOPSY;  Surgeon: Harvel Quale, MD;  [facility];  Service: Gastroenterology;;   esophageal at Z-line   BIOPSY  05/23/2021   Procedure: BIOPSY;  Surgeon: Harvel Quale, MD;  [facility];  Service: Gastroenterology;;   COLONOSCOPY N/A 09/22/2017   Procedure: COLONOSCOPY;  Surgeon: Rogene Houston, MD;  [facility];  Service: Endoscopy;  Laterality: N/A;  9:55   COLONOSCOPY WITH PROPOFOL N/A 05/23/2021   Procedure: COLONOSCOPY WITH PROPOFOL;  Surgeon: Harvel Quale, MD;  [facility];  Service: Gastroenterology;  Laterality: N/A;  7:30   ESOPHAGOGASTRODUODENOSCOPY (EGD) WITH PROPOFOL N/A 03/18/2021   Procedure: ESOPHAGOGASTRODUODENOSCOPY (EGD) WITH PROPOFOL;  Surgeon: Harvel Quale, MD;  [facility];  Service: Gastroenterology;  Laterality: N/A;   ESOPHAGOGASTRODUODENOSCOPY (EGD) WITH PROPOFOL N/A 05/23/2021   Procedure: ESOPHAGOGASTRODUODENOSCOPY (EGD) WITH PROPOFOL;  Surgeon: Harvel Quale, MD;  [facility];  Service: Gastroenterology;  Laterality: N/A;   POLYPECTOMY  09/22/2017   Procedure: POLYPECTOMY;  Surgeon: Rogene Houston, MD;  [facility];  Service: Endoscopy;;   RIGHT/LEFT HEART CATH AND CORONARY ANGIOGRAPHY N/A 12/12/2021   Procedure: RIGHT/LEFT HEART CATH AND CORONARY ANGIOGRAPHY;  Surgeon: Jolaine Artist, MD;  Location: [facility];  Service: Cardiovascular;  Laterality: N/A;   TRANSTHORACIC ECHOCARDIOGRAM  02/24/2021   EF 60 to 65%.  Normal LV function.  Normal valves.  Mild RV dilation with normal function.   Social History   Social History Narrative   Not on file   Immunization History  Administered Date(s) Administered   Influenza,inj,Quad PF,6+ Mos 09/10/2021   Moderna SARS-COV2 Booster Vaccination 02/11/2020, 03/10/2020, 10/08/2020, 11/09/2021   Tdap 02/08/2017     Objective: Vital Signs: BP 126/88 (BP Location: Left Arm, Patient Position: Sitting, Cuff Size: Normal)    Pulse (!) 112    Ht _0  (1.905 m)    Wt 252 lb  6.4 oz (114.5 kg)    BMI 31.55 kg/m    Physical Exam Vitals and nursing note reviewed.  Constitutional:      Appearance: He is well-developed.  HENT:     Head: Normocephalic and atraumatic.  Eyes:     Conjunctiva/sclera: Conjunctivae normal.     Pupils: Pupils are equal, round, and reactive to light.  Cardiovascular:     Rate and Rhythm: Normal rate and regular rhythm.     Heart sounds: Normal heart sounds.  Pulmonary:     Effort: Pulmonary effort is normal.     Breath sounds: Normal breath sounds.  Abdominal:     General: Bowel sounds are normal.     Palpations: Abdomen is soft.  Musculoskeletal:     Cervical back: Normal range of motion and neck supple.  Skin:    General: Skin is warm and dry.     Capillary Refill: Capillary refill takes less than 2 seconds.  Neurological:     Mental Status: He is alert and oriented to person, place, and time.     Deep Tendon Reflexes: Reflexes abnormal.     Comments: Coarse tremors noted.  Brisk DTRs noted.  Psychiatric:        Behavior: Behavior normal.  Musculoskeletal Exam: C-spine was in good range of motion.  Shoulder joints, elbow joints, wrist joints, MCPs PIPs and DIPs with good range of motion with no synovitis.  Hip joints, knee joints, ankles, MTPs and PIPs with good range of motion with no synovitis.  CDAI Exam: CDAI Score: -- Patient Global: --; Provider Global: -- Swollen: --; Tender: -- Joint Exam 01/08/2022   No joint exam has been documented for this visit   There is currently no information documented on the homunculus. Go to the Rheumatology activity and complete the homunculus joint exam.  Investigation: No additional findings.  Imaging: DG Chest 2 View  Result Date: 12/27/2021 CLINICAL DATA:  Shortness of breath, dyspnea on exertion, some chest pain and lightheadedness EXAM: CHEST - 2 VIEW COMPARISON:  10/02/2021 FINDINGS: Normal heart size, mediastinal contours, and pulmonary vascularity. Moderate-sized  hiatal hernia. Azygous fissure noted. Minimal scarring at lingula. Remaining lungs clear. No acute infiltrate, pleural effusion, or pneumothorax. No acute osseous findings. 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 IMPRESSION: Moderate-sized hiatal hernia. Lingular scarring without acute infiltrate. Electronically Signed   By: Lavonia Dana M.D.   On: 12/27/2021 14:12   CARDIAC CATHETERIZATION  Result Date: 12/12/2021   The left ventricular ejection fraction is 50-55% by visual estimate. Findings: RA = 4 RV = 23/4 PA = 22/6 (14) PCW = 7 Fick cardiac output/index = 7.1/3.0 PVR = 1.0 WU Ao sat = 95% PA sat = 76%, 76% SVC sat = 74% Assessment: 1. Normal coronary arteries 2. Normal hemodynamics 3. EF 50-55% by ventriculogram 4. Brief run of possible SVT prior to cath Plan/Discussion: Normal cath. Consider CPX testing to further evaluate cause of dyspnea. Place Zio monitor to further assess possible SVT. Glori Bickers, MD 9:12 AM    Recent Labs: Lab Results  Component Value Date   WBC 8.2 12/27/2021   HGB 16.7 12/27/2021   PLT 257 12/27/2021   NA 134 (L) 12/27/2021   K 3.8 12/27/2021   CL 104 12/27/2021   CO2 20 (L) 12/27/2021   GLUCOSE 83 12/27/2021   BUN 19 12/27/2021   CREATININE 1.31 (H) 12/27/2021   BILITOT 0.5 11/22/2021   ALKPHOS 81 11/22/2021   AST 45 (H) 11/22/2021   ALT 75 (H) 11/22/2021   PROT 7.3 11/22/2021   ALBUMIN 4.8 11/22/2021   CALCIUM 9.2 12/27/2021   GFRAA >60 10/14/2018   10/02/21:CK 356, Myomarker3 panel negative, aldolase 6, anti-CCP-, CRP<1, Scl-70-, SM ab-, dsDNA-, ANCA negative, ANA-, Ro-, La-, ESR 2, RNP 1.4, RF<14.   December 17, 2021 CK105  Speciality Comments: No specialty comments available.  Procedures:  No procedures performed Allergies: Claritin [loratadine] and Pollen extract   Assessment / Plan:     Visit Diagnoses: Elevated CK - Repeat CK normal.  He had no muscular weakness or tenderness.  He was  referred to neurology for brisk DTRs.  Autoimmune labs from October 02, 2021 were discussed at length with the patient again today.  His autoimmune work-up was completely negative.  I advised him to contact me in case he develops any new symptoms.  There is no history of oral ulcers, nasal ulcers, malar rash, photosensitivity, Raynaud's phenomenon or lymphadenopathy.  He had no synovitis on my examination today.  Hyperreflexia - He was evaluated by Dr. Tomi Likens.  Dr. Tomi Likens also noticed hyperreflexia.  He also complains of dizziness and gait instability.  Shortness of breath persists.  According to Dr. Georgie Chard note he may  get nerve conduction velocity and EMG studies.  MRI of the cervical spine is pending.  Tremor-patient states that his tremors are getting worse.  He states he is having difficulty doing routine activities and also driving due to dizziness and tremors.  Shortness of breath - His pulmonary work-up by Dr. Erin Fulling and cardiology work-up by Dr. Ellyn Hack were within normal limits.  He had high-resolution CT which was negative.  PFTs showed only mild diffusion defect.  Echocardiogram showed mild right right ventricle dilation otherwise unremarkable.  He continues to have shortness of breath and exertional dyspnea.  Sleep apnea in adult - dxd with OSA.  He has not started using CPAP yet.  Mixed hyperlipidemia  Gastroesophageal reflux disease without esophagitis  Barrett's esophagus without dysplasia - He had GI work-up.  Esophageal hiatal hernia-he continues to have some GI symptoms.  History of colonic polyps  Ehrlichiosis - Only IgG antibody was positive.  He was treated with antibiotics empirically.  He was evaluated by Dr. Juleen China today.  I reviewed the note.  He does not think that patient had rickettsial disease.  Eczema, unspecified type -he had dry pruritic scales on his ankles consistent with atopic dermatitis.  He uses topical steroids.  Anxiety-he is very anxious about his  current illness.  He is on Prozac.  Orders: No orders of the defined types were placed in this encounter.  No orders of the defined types were placed in this encounter.    Follow-Up Instructions: Return if symptoms worsen or fail to improve.   Bo Merino, MD  Note - This record has been created using Editor, commissioning.  Chart creation errors have been sought, but may not always  have been located. Such creation errors do not reflect on  the standard of medical care.

## 2021-12-27 ENCOUNTER — Emergency Department (HOSPITAL_COMMUNITY)
Admission: EM | Admit: 2021-12-27 | Discharge: 2021-12-27 | Disposition: A | Discharge status: Home / Self Care | Payer: 59 | Attending: Emergency Medicine | Admitting: Emergency Medicine

## 2021-12-27 ENCOUNTER — Encounter (HOSPITAL_COMMUNITY): Payer: Self-pay | Admitting: *Deleted

## 2021-12-27 ENCOUNTER — Emergency Department (HOSPITAL_COMMUNITY): Payer: 59

## 2021-12-27 ENCOUNTER — Telehealth: Payer: Self-pay | Admitting: Pulmonary Disease

## 2021-12-27 ENCOUNTER — Telehealth: Payer: Self-pay | Admitting: Cardiology

## 2021-12-27 ENCOUNTER — Telehealth: Payer: Self-pay | Admitting: Neurology

## 2021-12-27 DIAGNOSIS — R944 Abnormal results of kidney function studies: Secondary | ICD-10-CM | POA: Insufficient documentation

## 2021-12-27 DIAGNOSIS — R0789 Other chest pain: Secondary | ICD-10-CM | POA: Insufficient documentation

## 2021-12-27 DIAGNOSIS — E878 Other disorders of electrolyte and fluid balance, not elsewhere classified: Secondary | ICD-10-CM | POA: Insufficient documentation

## 2021-12-27 DIAGNOSIS — R0602 Shortness of breath: Secondary | ICD-10-CM | POA: Diagnosis not present

## 2021-12-27 DIAGNOSIS — E860 Dehydration: Secondary | ICD-10-CM | POA: Insufficient documentation

## 2021-12-27 DIAGNOSIS — R079 Chest pain, unspecified: Secondary | ICD-10-CM

## 2021-12-27 DIAGNOSIS — Z7982 Long term (current) use of aspirin: Secondary | ICD-10-CM | POA: Insufficient documentation

## 2021-12-27 DIAGNOSIS — K449 Diaphragmatic hernia without obstruction or gangrene: Secondary | ICD-10-CM | POA: Diagnosis not present

## 2021-12-27 LAB — BASIC METABOLIC PANEL
Anion gap: 10 (ref 5–15)
BUN: 19 mg/dL (ref 6–20)
CO2: 20 mmol/L — ABNORMAL LOW (ref 22–32)
Calcium: 9.2 mg/dL (ref 8.9–10.3)
Chloride: 104 mmol/L (ref 98–111)
Creatinine, Ser: 1.31 mg/dL — ABNORMAL HIGH (ref 0.61–1.24)
GFR, Estimated: >60 mL/min (ref >60)
Glucose, Bld: 83 mg/dL (ref 70–99)
Potassium: 3.8 mmol/L (ref 3.5–5.1)
Sodium: 134 mmol/L — ABNORMAL LOW (ref 135–145)

## 2021-12-27 LAB — CBC
HCT: 49.8 % (ref 39.0–52.0)
Hemoglobin: 16.7 g/dL (ref 13.0–17.0)
MCH: 29.4 pg (ref 26.0–34.0)
MCHC: 33.5 g/dL (ref 30.0–36.0)
MCV: 87.7 fL (ref 80.0–100.0)
Platelets: 257 10*3/uL (ref 150–400)
RBC: 5.68 MIL/uL (ref 4.22–5.81)
RDW: 12.4 % (ref 11.5–15.5)
WBC: 8.2 10*3/uL (ref 4.0–10.5)
nRBC: 0 % (ref 0.0–0.2)

## 2021-12-27 LAB — TROPONIN I (HIGH SENSITIVITY)
Troponin I (High Sensitivity): <2 ng/L (ref <18)
Troponin I (High Sensitivity): <2 ng/L (ref <18)

## 2021-12-27 LAB — D-DIMER, QUANTITATIVE: D-Dimer, Quant: <0.27 ug/mL-FEU (ref 0.00–0.50)

## 2021-12-27 LAB — BRAIN NATRIURETIC PEPTIDE: B Natriuretic Peptide: 7 pg/mL (ref 0.0–100.0)

## 2021-12-27 MED ORDER — SODIUM CHLORIDE 0.9 % IV BOLUS
500.0000 mL | Freq: Once | INTRAVENOUS | Status: AC
  Administered 2021-12-27: 500 mL via INTRAVENOUS

## 2021-12-27 MED ORDER — ALBUTEROL SULFATE HFA 108 (90 BASE) MCG/ACT IN AERS
2.0000 | INHALATION_SPRAY | Freq: Once | RESPIRATORY_TRACT | Status: AC
  Administered 2021-12-27: 2 via RESPIRATORY_TRACT
  Filled 2021-12-27: qty 6.7

## 2021-12-27 NOTE — Telephone Encounter (Signed)
Spoke to pt, Pt c/o numbness and tingling in his left arm and hands since last night. He is unsure if he slept on that arm or not. Chest pain and shortness on and off. Pt has an appt next week with Pulmonary in regards to the shortness of breathe.  Advised pt to go to the ED. Please call [facility] to schedule his appt for MRI Cervical spine if they do not do one at the ED today.

## 2021-12-27 NOTE — Telephone Encounter (Signed)
Left message for patient to call back  

## 2021-12-27 NOTE — Discharge Instructions (Signed)
You were seen in the emergency department for worsening shortness of breath along with chest pain.  You had blood work EKG chest x-ray that did not show an obvious explanation for your symptoms.  You were also concerned about tick exposure and those test were sent to the lab.  They should result in a few days.  Please follow-up with your primary care doctor and your treating specialist.  Return if any worsening or concerning symptoms

## 2021-12-27 NOTE — Telephone Encounter (Signed)
Patient called and said left side near heart hurt last night and woke up this morning with a tingling and numb arm all the way down to the fingers.

## 2021-12-27 NOTE — Telephone Encounter (Signed)
° °  Pt c/o Shortness Of Breath: STAT if SOB developed within the last 24 hours or pt is noticeably SOB on the phone  1. Are you currently SOB (can you hear that pt is SOB on the phone)? No   2. How long have you been experiencing SOB? Since his heart cath  3. Are you SOB when sitting or when up moving around? Moving around  4. Are you currently experiencing any other symptoms? Pain on left side of his chest beside his heart when he lays down and breath, fatigue, lightheadedness

## 2021-12-27 NOTE — ED Provider Notes (Signed)
[facility] EMERGENCY DEPARTMENT Provider Note   CSN: [id number] Arrival date & time: 12/27/21  1321     History  Chief Complaint  Patient presents with   Shortness of Breath    Elijah Shaffer is a 41 y.o. male.  He is here with a complaint of increased shortness of breath since last night.  Associated with some left-sided chest discomfort.  No fevers or chills.  He said he has had the shortness of breath going on for over a year and his pulmonologist and cardiologist have not been able to figure out why.  He is also seeing neurology for some numbness.  He said his left arm was numb down to his fingertips when he woke up this morning but it is improved.  Not associate with any weakness.  He said he the symptoms have restricted him at work and he cannot work as long as he usually used to.   The history is provided by the patient.  Shortness of Breath Severity:  Unable to specify Onset quality:  Gradual Duration:  12 months Timing:  Intermittent Progression:  Unchanged Relieved by:  Nothing Worsened by:  Activity Ineffective treatments:  Rest Associated symptoms: chest pain   Associated symptoms: no abdominal pain, no fever, no headaches, no hemoptysis, no neck pain, no rash, no sore throat and no sputum production       Home Medications Prior to Admission medications   Medication Sig Start Date End Date Taking? Authorizing Provider  acetaminophen (TYLENOL) 500 MG tablet Take 500-1,000 mg by mouth every 6 (six) hours as needed for moderate pain or mild pain.    [provider]  albuterol (VENTOLIN HFA) 108 (90 Base) MCG/ACT inhaler Inhale 2 puffs into the lungs every 6 (six) hours as needed for shortness of breath. 06/26/21   Lindell Spar, MD  aspirin EC 81 MG tablet Take 81 mg by mouth daily. Swallow whole.    [provider]  b complex vitamins capsule Take 1 capsule by mouth daily. With D3 gummie    [provider]  betamethasone dipropionate 0.05  % cream Apply 1 application topically 2 (two) times daily as needed (Rash).    [provider]  cetirizine (ZYRTEC) 10 MG tablet Take 10 mg by mouth daily.    [provider]  diphenhydrAMINE-zinc acetate (BENADRYL EXTRA STRENGTH) cream Apply 1 application topically 3 (three) times daily as needed for itching. 12/07/21   Renee Rival, FNP  FLUoxetine (PROZAC) 10 MG capsule Take 1 capsule (10 mg total) by mouth daily. 12/07/21   Paseda, Dewaine Conger, FNP  fluticasone (FLONASE ALLERGY RELIEF) 50 MCG/ACT nasal spray Place 2 sprays into both nostrils daily as needed for allergies or rhinitis. Patient taking differently: Place 2 sprays into both nostrils daily. 09/26/21   Lindell Spar, MD  fluticasone-salmeterol (ADVAIR DISKUS) 250-50 MCG/ACT AEPB Inhale 1 puff into the lungs in the morning and at bedtime. 09/28/21   Freddi Starr, MD  guaiFENesin (MUCINEX) 600 MG 12 hr tablet Take 600 mg by mouth 2 (two) times daily as needed for to loosen phlegm.    [provider]  hydrOXYzine (ATARAX/VISTARIL) 10 MG tablet Take 1 tablet (10 mg total) by mouth 3 (three) times daily as needed for anxiety. 07/26/21   Lindell Spar, MD  meclizine (ANTIVERT) 12.5 MG tablet Take 12.5 mg by mouth daily as needed for dizziness.    [provider]  montelukast (SINGULAIR) 10 MG tablet Take 1  tablet (10 mg total) by mouth at bedtime. 09/28/21   Freddi Starr, MD  omega-3 acid ethyl esters (LOVAZA) 1 g capsule Take 2 capsules (2 g total) by mouth 2 (two) times daily. 11/27/21   Lindell Spar, MD  ondansetron (ZOFRAN) 4 MG tablet Take 1 tablet (4 mg total) by mouth every 6 (six) hours as needed for nausea. 06/04/21   Scot Jun, FNP  pantoprazole (PROTONIX) 40 MG tablet Take 1 tablet (40 mg total) by mouth 2 (two) times daily. 12/12/21 03/12/22  Harvel Quale, MD  polyethylene glycol (MIRALAX / GLYCOLAX) 17 g packet Take 17 g by mouth daily as needed for severe  constipation or moderate constipation.    [provider]  triamcinolone cream (KENALOG) 0.1 % Apply 1 application topically 2 (two) times daily. 12/18/21   Lindell Spar, MD      Allergies    Pollen extract and Claritin [loratadine]    Review of Systems   Review of Systems  Constitutional:  Negative for fever.  HENT:  Negative for sore throat.   Eyes:  Negative for visual disturbance.  Respiratory:  Positive for shortness of breath. Negative for hemoptysis and sputum production.   Cardiovascular:  Positive for chest pain.  Gastrointestinal:  Negative for abdominal pain.  Genitourinary:  Negative for dysuria.  Musculoskeletal:  Negative for neck pain.  Skin:  Negative for rash.  Neurological:  Positive for numbness. Negative for headaches.   Physical Exam Updated Vital Signs BP (!) 136/101    Pulse 80    Temp 98.1 F (36.7 C) (Oral)    Resp (!) 22    SpO2 95%  Physical Exam Vitals and nursing note reviewed.  Constitutional:      General: He is not in acute distress.    Appearance: He is well-developed.  HENT:     Head: Normocephalic and atraumatic.  Eyes:     Conjunctiva/sclera: Conjunctivae normal.  Cardiovascular:     Rate and Rhythm: Normal rate and regular rhythm.     Heart sounds: No murmur heard. Pulmonary:     Effort: Pulmonary effort is normal. No respiratory distress.     Breath sounds: Normal breath sounds.  Abdominal:     Palpations: Abdomen is soft.     Tenderness: There is no abdominal tenderness.  Musculoskeletal:        General: No swelling. Normal range of motion.     Cervical back: Neck supple.     Right lower leg: No tenderness. No edema.     Left lower leg: No tenderness. No edema.  Skin:    General: Skin is warm and dry.     Capillary Refill: Capillary refill takes less than 2 seconds.  Neurological:     General: No focal deficit present.     Mental Status: He is alert.    ED Results / Procedures / Treatments   Labs (all labs  ordered are listed, but only abnormal results are displayed) Labs Reviewed  BASIC METABOLIC PANEL - Abnormal; Notable for the following components:      Result Value   Sodium 134 (*)    CO2 20 (*)    Creatinine, Ser 1.31 (*)    All other components within normal limits  CBC  BRAIN NATRIURETIC PEPTIDE  D-DIMER, QUANTITATIVE  LYME DISEASE SEROLOGY W/REFLEX  ROCKY MTN SPOTTED FVR ABS PNL(IGG+IGM)  TROPONIN I (HIGH SENSITIVITY)  TROPONIN I (HIGH SENSITIVITY)    EKG EKG Interpretation  Date/Time:  Thursday December 27 2021 13:32:55 EST Ventricular Rate:  98 PR Interval:  166 QRS Duration: 100 QT Interval:  324 QTC Calculation: 413 R Axis:   61 Text Interpretation: Normal sinus rhythm Nonspecific T wave abnormality Abnormal ECG When compared with ECG of 03-Jun-2021 17:38, No significant change was found Confirmed by Aletta Edouard [phone]) on 12/27/2021 3:01:14 PM  Radiology DG Chest 2 View  Result Date: 12/27/2021 CLINICAL DATA:  Shortness of breath, dyspnea on exertion, some chest pain and lightheadedness EXAM: CHEST - 2 VIEW COMPARISON:  10/02/2021 FINDINGS: Normal heart size, mediastinal contours, and pulmonary vascularity. Moderate-sized hiatal hernia. Azygous fissure noted. Minimal scarring at lingula. Remaining lungs clear. No acute infiltrate, pleural effusion, or pneumothorax. No acute osseous findings. 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 IMPRESSION: Moderate-sized hiatal hernia. Lingular scarring without acute infiltrate. Electronically Signed   By: Lavonia Dana M.D.   On: 12/27/2021 14:12    Procedures Procedures    Medications Ordered in ED Medications  sodium chloride 0.9 % bolus 500 mL (0 mLs Intravenous Stopped 12/27/21 1826)  albuterol (VENTOLIN HFA) 108 (90 Base) MCG/ACT inhaler 2 puff (2 puffs Inhalation Given 12/27/21 1646)    ED Course/ Medical Decision Making/ A&P Clinical Course as of 12/28/21 1040  Thu  Dec 27, 2021  1510 12/12/21, right and left heart cath-Assessment: 1. Normal coronary arteries 2. Normal hemodynamics 3. EF 50-55% by ventriculogram 4. Brief run of possible SVT prior to cath  [MB]  1811 Reviewed results of work-up with patient.  He is very disappointed we are unable to come up with an answer for him.  He said cardiology and pulmonology notes have been able to figure out what is wrong with him.  Recommended continued follow-up with his specialist. [MB]    Clinical Course User Index [MB] Hayden Rasmussen, MD                           Medical Decision Making Amount and/or Complexity of Data Reviewed Labs: ordered. Radiology: ordered.  Risk Prescription drug management.  Elijah Shaffer was evaluated in Emergency Department on 12/27/2021 for the symptoms described in the history of present illness. He was evaluated in the context of the global COVID-19 pandemic, which necessitated consideration that the patient might be at risk for infection with the SARS-CoV-2 virus that causes COVID-19. Institutional protocols and algorithms that pertain to the evaluation of patients at risk for COVID-19 are in a state of rapid change based on information released by regulatory bodies including the CDC and federal and state organizations. These policies and algorithms were followed during the patient's care in the ED.  This patient complains of ongoing shortness of breath dyspnea on exertion and fatigue; this involves an extensive number of treatment Options and is a complaint that carries with it a high risk of complications and morbidity. The differential includes anemia, PE, ACS, anemia, metabolic derangement CHF  I ordered, reviewed and interpreted labs, which included CBC with normal white count normal hemoglobin, chemistries mildly low sodium low bicarb elevated creatinine lactic some dehydration, troponins unremarkable, D-dimer normal, BMP normal. patient concern for possible tick  exposures so sent titers of [facility] spotted fever I ordered medication IV fluids and albuterol inhaler and reviewed PMP when indicated. I ordered imaging studies which included chest x-ray and I independently    visualized and interpreted imaging which showed no acute findings  Additional history obtained from patient's mother Previous records obtained and reviewed in epic, has seen pulmonology and cardiology, had a recent left and right heart cath Cardiac monitoring reviewed, normal sinus rhythm  After the interventions stated above, I reevaluated the patient and found patient to be oxygenating well on room air. Admission and further testing considered, no indications for admission or further work-up at this time.  Close follow-up with his treating providers.  Return instructions discussed          Final Clinical Impression(s) / ED Diagnoses Final diagnoses:  Shortness of breath  Nonspecific chest pain    Rx / DC Orders ED Discharge Orders     None         Hayden Rasmussen, MD 12/28/21 1043

## 2021-12-27 NOTE — Telephone Encounter (Signed)
For hypertension, patient can go to see his PCP first as well.  I literally saw him at the request of pulmonary medicine to do a right heart cath.  We did a right left heart cath and the plan was for him to return to the care of his primary team.  We to not need to double book -> we can schedule him to see an APP for CVRR to discuss blood pressure management as well.  Glenetta Hew, MD

## 2021-12-27 NOTE — Telephone Encounter (Signed)
Patient called in to triage and reports sob with light-headedness and headache. He states this has been ongoing for a long time and it is making him depressed. He has an appointment with pulmonologist next week. He is taking all prescribed medications. While on the phone his BP 167/117, P 136. Recommended that he cal 911 to take him to the ER. He asked me to explain this to his mother. I spoke with his mother and informed her of vital signs and complaints. She said they live close to the hospital and will drive him there. I scheduled him for Dr. Ellyn Hack for 5/15. Let me know if you want me to double book patient.

## 2021-12-27 NOTE — Telephone Encounter (Signed)
Thanks for update.   Difficult case as we do not have much showing up on the diagnostic tests to point to a specific diagnosis.   JD

## 2021-12-27 NOTE — Telephone Encounter (Signed)
Called and spoke to pt. Pt was upset as he feels there are so many things going, symptoms he's having, with no answers. Pt states he has had ongoing chest discomfort, dizziness with activity, ShOB with activity x months. Pt states he is unable to live his life and he feels he is getting depressed. Pt states he had his right/left hear cath and also wore a holter monitor (returned 2/15). Pt saw Neuro on 2/10 and MRI of cervical spine was ordered. Advised pt to call [place] [facility]ology to get this scheduled as it has not been yet, phone number given. Pt states he hasnt been sleeping well, pt had HST. Pt also states when he stretches his legs at night they shake involuntarily like when deep tendon reflexes are checked. Pt states this morning he woke up and his left arm was cold, tingling, and numb. Pt states his arm is now fine, warm to the touch, normal in color, and normal sensation, pt called neuro about this and was advised to go to ED, per phone note.  Phone conversation lasted nearly 30 minutes as pt was expressing his concerns with his multiple issues and is still unsure of what is going on. Advised pt that I would relay this information to Dr. Erin Fulling to have for his upcoming appt on 2/22 with JD. Pt was appreciative of Dr. Erin Fulling and staff for helping. Pt aware to seek emergency care if any new/worsening s/s.   Will forward to Dr. Erin Fulling as Juluis Rainier.

## 2021-12-28 ENCOUNTER — Telehealth: Payer: Self-pay | Admitting: *Deleted

## 2021-12-28 LAB — ROCKY MTN SPOTTED FVR ABS PNL(IGG+IGM)
RMSF IgG: NEGATIVE
RMSF IgM: 1.67 index — ABNORMAL HIGH (ref 0.00–0.89)

## 2021-12-28 LAB — LYME DISEASE SEROLOGY W/REFLEX: Lyme Total Antibody EIA: NEGATIVE

## 2021-12-28 NOTE — Telephone Encounter (Signed)
° °  Telephone encounter was:  Unsuccessful.  12/28/2021 Name: Elijah Shaffer MRN: [id number] DOB: 10/02/1981  Unsuccessful outbound call made today to assist with:   dental  Outreach Attempt:  1st Attempt  A HIPAA compliant voice message was left requesting a return call.  Instructed patient to call back at   Instructed patient to call back at [phone]  at their earliest convenience. .  [place], Care Management  [phone] [address] [place] , [place] [address] Email : [email or web address]

## 2021-12-31 ENCOUNTER — Ambulatory Visit (INDEPENDENT_AMBULATORY_CARE_PROVIDER_SITE_OTHER): Payer: 59 | Admitting: Internal Medicine

## 2021-12-31 ENCOUNTER — Encounter: Payer: Self-pay | Admitting: Cardiology

## 2021-12-31 ENCOUNTER — Encounter: Payer: Self-pay | Admitting: Internal Medicine

## 2021-12-31 ENCOUNTER — Telehealth: Payer: Self-pay | Admitting: Neurology

## 2021-12-31 ENCOUNTER — Other Ambulatory Visit: Payer: Self-pay

## 2021-12-31 ENCOUNTER — Telehealth: Payer: Self-pay | Admitting: Pulmonary Disease

## 2021-12-31 ENCOUNTER — Telehealth: Payer: Self-pay | Admitting: *Deleted

## 2021-12-31 ENCOUNTER — Telehealth: Payer: Self-pay

## 2021-12-31 ENCOUNTER — Telehealth (INDEPENDENT_AMBULATORY_CARE_PROVIDER_SITE_OTHER): Payer: Self-pay | Admitting: *Deleted

## 2021-12-31 VITALS — BP 124/88 | HR 89 | Resp 18 | Ht 75.0 in | Wt 255.1 lb

## 2021-12-31 DIAGNOSIS — Z09 Encounter for follow-up examination after completed treatment for conditions other than malignant neoplasm: Secondary | ICD-10-CM | POA: Diagnosis not present

## 2021-12-31 DIAGNOSIS — A770 Spotted fever due to Rickettsia rickettsii: Secondary | ICD-10-CM

## 2021-12-31 DIAGNOSIS — R69 Illness, unspecified: Secondary | ICD-10-CM | POA: Diagnosis not present

## 2021-12-31 DIAGNOSIS — R0609 Other forms of dyspnea: Secondary | ICD-10-CM

## 2021-12-31 DIAGNOSIS — F419 Anxiety disorder, unspecified: Secondary | ICD-10-CM

## 2021-12-31 MED ORDER — DOXYCYCLINE HYCLATE 100 MG PO TABS: 100.0000 mg | ORAL_TABLET | Freq: Two times a day (BID) | ORAL | 0 refills | Status: DC

## 2021-12-31 MED ORDER — FLUOXETINE HCL 20 MG PO CAPS: 20.0000 mg | ORAL_CAPSULE | Freq: Every day | ORAL | 1 refills | Status: DC

## 2021-12-31 NOTE — Telephone Encounter (Signed)
Called patient but he did not answer. Left message for patient to call back.  °

## 2021-12-31 NOTE — Telephone Encounter (Signed)
Patient called and stated his blood pressure has been up and down, shortness of breath, his eye hurts and twitches, he loses balance when he walks and gets light headed.  He stated he went to the hospital last Friday with high blood pressure.

## 2021-12-31 NOTE — Assessment & Plan Note (Signed)
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

## 2021-12-31 NOTE — Telephone Encounter (Signed)
Patient called requesting follow up appointment with our office due to his RMSF results on 12/27/21 in the ED. Patient was previously seen in our office for this last year. Patient stated he would like to see a different provider in our office. Patient is scheduled with Dr. Juleen China 01/08/22.  Coleraine Andilyn Bettcher, CMA

## 2021-12-31 NOTE — Telephone Encounter (Signed)
Patient did call me back. Patient went to hospital on Friday and they did labs and EKG and Troponin levels. They did discover patient has [facility] spotted fever. Patient has also been to his PCP who gave him Prozac and Doxycycline for the treatment of the RMSF. Patient frustrated because he is still having the weakness and numbness and tingling. It appears besides labs and EKG I didn't see any other testing

## 2021-12-31 NOTE — Telephone Encounter (Signed)
Called P[facility]int and was unable to reach left voicemail to call office back

## 2021-12-31 NOTE — Telephone Encounter (Signed)
Pt seen by Dr. Jenetta Downer on 11/22/21. Taking protonix 40mg  bid for reflux. Saw Dr. Posey Pronto today and states he was telling him what symptoms he is having, sob, fatigue, headache, dizziness, belching a lot, when laying down can hear a gurgling sound. Last night when he heard it he states he felt something go down into stomach and then the sound stopped. States dr patel told him some of his symptoms could be reflux and and also depression/anxiety. Told follow up with GI about reflux. Pt states he does take his meds bid like prescribed for reflux. Was worried because he states dr patel told him reflux could lead to cancer later on.

## 2021-12-31 NOTE — Assessment & Plan Note (Signed)
History of multiple recurrent tick bite Has had Ehrlichiosis in the past, recent ER visit showed RMSF IgM positive -started doxycycline

## 2021-12-31 NOTE — Progress Notes (Signed)
Established Patient Office Visit  Subjective:  Patient ID: JAKING THAYER, male    DOB: May 19, 1981  Age: 41 y.o. MRN: [id number]  CC:  Chief Complaint  Patient presents with   Follow-up    Pt went to Er on 12-28-21 he has been having sob when exertion also gurgling when laying on side has cardio and pulmonologist     HPI Elijah Shaffer is a 41 y.o. male with past medical history of GERD, eczema, anxiety, recurrent tickborne infections and chronic dyspnea who presents for f/u after recent ER visit.  He visited ER for fever, fatigue and dyspnea on exertion, which are chronic complaints.  He also reported multiple tick bites.  Denies noticing any new rash recently. He also c/o gurgling sounds when he tries to lay on his left side.  Of note, he has a history of GERD and Barrett's esophagus, for which she takes Protonix.  He has had extensive pulmonology and cardiac work-up, including PFTs, echo, left and right heart cath, which have not been able to show reason behind his dyspnea.  He also has been seen by rheumatology and neurology for chronic fatigue and weakness of the extremities.  He has been feeling depressed and anxious at times as he is not able to work because of his symptoms.  Denies any SI or HI currently.  He currently takes Prozac for anxiety.  He also takes Vistaril as needed for it.   Past Medical History:  Diagnosis Date   Acute renal injury ([place]) 06/28/2017   Anxiety    Aspiration pneumonia of right upper lobe due to gastric secretions ([place])    Diverticulitis    GERD (gastroesophageal reflux disease) 01/08/2018   Seasonal allergies    Syncope 06/26/2021    Past Surgical History:  Procedure Laterality Date   BIOPSY  03/18/2021   Procedure: BIOPSY;  Surgeon: Harvel Quale, MD;  [facility];  Service: Gastroenterology;;  esophageal at [facility]   BIOPSY  05/23/2021   Procedure: BIOPSY;  Surgeon: Harvel Quale, MD;  [facility];   Service: Gastroenterology;;   COLONOSCOPY N/A 09/22/2017   Procedure: COLONOSCOPY;  Surgeon: Rogene Houston, MD;  [facility];  Service: Endoscopy;  Laterality: N/A;  9:55   COLONOSCOPY WITH PROPOFOL N/A 05/23/2021   Procedure: COLONOSCOPY WITH PROPOFOL;  Surgeon: Harvel Quale, MD;  [facility];  Service: Gastroenterology;  Laterality: N/A;  7:30   ESOPHAGOGASTRODUODENOSCOPY (EGD) WITH PROPOFOL N/A 03/18/2021   Procedure: ESOPHAGOGASTRODUODENOSCOPY (EGD) WITH PROPOFOL;  Surgeon: Harvel Quale, MD;  [facility];  Service: Gastroenterology;  Laterality: N/A;   ESOPHAGOGASTRODUODENOSCOPY (EGD) WITH PROPOFOL N/A 05/23/2021   Procedure: ESOPHAGOGASTRODUODENOSCOPY (EGD) WITH PROPOFOL;  Surgeon: Harvel Quale, MD;  [facility];  Service: Gastroenterology;  Laterality: N/A;   POLYPECTOMY  09/22/2017   Procedure: POLYPECTOMY;  Surgeon: Rogene Houston, MD;  [facility];  Service: Endoscopy;;   RIGHT/LEFT HEART CATH AND CORONARY ANGIOGRAPHY N/A 12/12/2021   Procedure: RIGHT/LEFT HEART CATH AND CORONARY ANGIOGRAPHY;  Surgeon: Jolaine Artist, MD;  Location: [facility];  Service: Cardiovascular;  Laterality: N/A;   TRANSTHORACIC ECHOCARDIOGRAM  02/24/2021   EF 60 to 65%.  Normal LV function.  Normal valves.  Mild RV dilation with normal function.    Family History  Problem Relation Age of Onset   Healthy Mother    Cancer Father    Colon cancer Maternal Grandmother  Colon cancer Maternal Grandfather     Social History   Socioeconomic History   Marital status: Single    Spouse name: Not on file   Number of children: Not on file   Years of education: Not on file   Highest education level: Not on file  Occupational History   Not on file  Tobacco Use   Smoking status: Never   Smokeless tobacco: Never  Vaping Use   Vaping Use: Never used  Substance and Sexual Activity   Alcohol use:  Yes    Comment: occ   Drug use: No   Sexual activity: Not on file  Other Topics Concern   Not on file  Social History Narrative   Not on file   Social Determinants of Health   Financial Resource Strain: Not on file  Food Insecurity: Not on file  Transportation Needs: Not on file  Physical Activity: Not on file  Stress: Not on file  Social Connections: Not on file  Intimate Partner Violence: Not on file    Outpatient Medications Prior to Visit  Medication Sig Dispense Refill   acetaminophen (TYLENOL) 500 MG tablet Take 500-1,000 mg by mouth every 6 (six) hours as needed for moderate pain or mild pain.     albuterol (VENTOLIN HFA) 108 (90 Base) MCG/ACT inhaler Inhale 2 puffs into the lungs every 6 (six) hours as needed for shortness of breath. 8 g 0   aspirin EC 81 MG tablet Take 81 mg by mouth daily. Swallow whole.     b complex vitamins capsule Take 1 capsule by mouth daily. With D3 gummie     betamethasone dipropionate 0.05 % cream Apply 1 application topically 2 (two) times daily as needed (Rash).     cetirizine (ZYRTEC) 10 MG tablet Take 10 mg by mouth daily.     diphenhydrAMINE-zinc acetate (BENADRYL EXTRA STRENGTH) cream Apply 1 application topically 3 (three) times daily as needed for itching. 28.4 g 0   docusate sodium (COLACE) 100 MG capsule Take 100 mg by mouth 2 (two) times daily.     fluticasone (FLONASE ALLERGY RELIEF) 50 MCG/ACT nasal spray Place 2 sprays into both nostrils daily as needed for allergies or rhinitis. (Patient taking differently: Place 2 sprays into both nostrils daily.) 9.9 mL 2   fluticasone-salmeterol (ADVAIR DISKUS) 250-50 MCG/ACT AEPB Inhale 1 puff into the lungs in the morning and at bedtime. 60 each 6   guaiFENesin (MUCINEX) 600 MG 12 hr tablet Take 600 mg by mouth 2 (two) times daily as needed for to loosen phlegm.     hydrOXYzine (ATARAX/VISTARIL) 10 MG tablet Take 1 tablet (10 mg total) by mouth 3 (three) times daily as needed for anxiety. 30  tablet 0   meclizine (ANTIVERT) 12.5 MG tablet Take 12.5 mg by mouth daily as needed for dizziness.     montelukast (SINGULAIR) 10 MG tablet Take 1 tablet (10 mg total) by mouth at bedtime. 30 tablet 11   omega-3 acid ethyl esters (LOVAZA) 1 g capsule Take 2 capsules (2 g total) by mouth 2 (two) times daily. 120 capsule 3   ondansetron (ZOFRAN) 4 MG tablet Take 1 tablet (4 mg total) by mouth every 6 (six) hours as needed for nausea. 20 tablet 0   pantoprazole (PROTONIX) 40 MG tablet Take 1 tablet (40 mg total) by mouth 2 (two) times daily. 180 tablet 0   Potassium 99 MG TABS Take 1 tablet by mouth daily.     triamcinolone cream (KENALOG) 0.1 %  Apply 1 application topically 2 (two) times daily. 30 g 0   FLUoxetine (PROZAC) 10 MG capsule Take 1 capsule (10 mg total) by mouth daily. 30 capsule 1   No facility-administered medications prior to visit.    Allergies  Allergen Reactions   Claritin [Loratadine] Other (See Comments)    Heart race   Pollen Extract Other (See Comments)    Seasonal allergies    ROS Review of Systems  Constitutional:  Positive for fatigue and fever. Negative for chills.  HENT:  Negative for congestion and sore throat.   Eyes:  Negative for pain and discharge.  Respiratory:  Positive for shortness of breath. Negative for cough.   Cardiovascular:  Negative for chest pain and palpitations.  Gastrointestinal:  Negative for diarrhea, nausea and vomiting.  Endocrine: Negative for polydipsia and polyuria.  Genitourinary:  Negative for dysuria and hematuria.  Musculoskeletal:  Positive for arthralgias, back pain and myalgias. Negative for neck pain and neck stiffness.  Neurological:  Positive for dizziness and weakness. Negative for headaches.  Psychiatric/Behavioral:  Positive for sleep disturbance. Negative for agitation and behavioral problems. The patient is nervous/anxious.      Objective:    Physical Exam Vitals reviewed.  Constitutional:      General: He is  not in acute distress.    Appearance: He is not diaphoretic.  HENT:     Head: Normocephalic and atraumatic.     Nose: Nose normal.     Mouth/Throat:     Mouth: Mucous membranes are moist.  Eyes:     General: No scleral icterus.    Extraocular Movements: Extraocular movements intact.  Cardiovascular:     Rate and Rhythm: Normal rate and regular rhythm.     Pulses: Normal pulses.     Heart sounds: Normal heart sounds. No murmur heard. Pulmonary:     Breath sounds: Normal breath sounds. No wheezing or rales.  Musculoskeletal:     Cervical back: Neck supple. No tenderness.     Right lower leg: No edema.     Left lower leg: No edema.  Skin:    General: Skin is warm.     Coloration: Skin is not jaundiced.  Neurological:     General: No focal deficit present.     Mental Status: He is alert and oriented to person, place, and time.  Psychiatric:        Mood and Affect: Mood is depressed.        Behavior: Behavior is slowed.        Thought Content: Thought content does not include homicidal or suicidal ideation.    BP 124/88 (BP Location: Right Arm, Patient Position: Sitting, Cuff Size: Normal)    Pulse 89    Resp 18    Ht $R'6\' 3"'kr$  (1.905 m)    Wt 255 lb 1.3 oz (115.7 kg)    SpO2 98%    BMI 31.88 kg/m  Wt Readings from Last 3 Encounters:  12/31/21 255 lb 1.3 oz (115.7 kg)  12/21/21 251 lb 3.2 oz (113.9 kg)  12/17/21 253 lb (114.8 kg)    Lab Results  Component Value Date   TSH 1.440 07/18/2021   Lab Results  Component Value Date   WBC 8.2 12/27/2021   HGB 16.7 12/27/2021   HCT 49.8 12/27/2021   MCV 87.7 12/27/2021   PLT 257 12/27/2021   Lab Results  Component Value Date   NA 134 (L) 12/27/2021   K 3.8 12/27/2021   CO2 20 (L)  12/27/2021   GLUCOSE 83 12/27/2021   BUN 19 12/27/2021   CREATININE 1.31 (H) 12/27/2021   BILITOT 0.5 11/22/2021   ALKPHOS 81 11/22/2021   AST 45 (H) 11/22/2021   ALT 75 (H) 11/22/2021   PROT 7.3 11/22/2021   ALBUMIN 4.8 11/22/2021   CALCIUM  9.2 12/27/2021   ANIONGAP 10 12/27/2021   EGFR 68 12/10/2021   GFR 68.05 10/02/2021   Lab Results  Component Value Date   CHOL 122 11/22/2021   Lab Results  Component Value Date   HDL 31 (L) 11/22/2021   Lab Results  Component Value Date   LDLCALC 25 11/22/2021   Lab Results  Component Value Date   TRIG 481 (H) 11/22/2021   Lab Results  Component Value Date   CHOLHDL 3.9 11/22/2021   Lab Results  Component Value Date   HGBA1C 5.4 02/24/2021      Assessment & Plan:   Problem List Items Addressed This Visit       Other   Dyspnea on minimal exertion (Chronic)    Fatigue and dyspnea on exertion Has decreased DLCO on PFT with mild RV dilation Uses Advair and PRN Albuterol Rheumatology eval done for possible mixed connective tissue disorder - elevated CK level and elevated ENA RNP antibody      Anxiety    Had started Prozac 10 mg daily, uncontrolled currently Increased dose of Prozac, 20 mg QD now Hydroxyzine as needed for anxiety      Relevant Medications   FLUoxetine (PROZAC) 20 MG capsule   [place] spotted fever    History of multiple recurrent tick bite 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    Dispense:  28 tablet    Refill:  0   FLUoxetine (PROZAC) 20 MG capsule    Sig: Take 1 capsule (20 mg total) by mouth daily.    Dispense:  90 capsule    Refill:  1    Change of dose    Follow-up: Return if symptoms  worsen or fail to improve.    Lindell Spar, MD

## 2021-12-31 NOTE — Telephone Encounter (Signed)
° °  Telephone encounter was:  Unsuccessful.  12/31/2021 Name: DOMONIC KIMBALL MRN: [id number] DOB: 06/09/1981  Unsuccessful outbound call made today to assist with:  Dentist  Outreach Attempt:  2nd Attempt  A HIPAA compliant voice message was left requesting a return call.  Instructed patient to call back at   Instructed patient to call back at [phone]  at their earliest convenience. .  [place], Care Management  [phone] [address] , [place] [address] Email : [email or web address]

## 2021-12-31 NOTE — Telephone Encounter (Signed)
Called patient, he requested a call via mychart messages.  I did advise with him, if having the same symptoms, MD had recommended he see an APP. Patient is having SOB with activities and it was mentioned that he should have a stress test. I advised that we have to consent for these and would need an appointment if this was ordered.   I scheduled patient on 03/06 with Raquel Sarna, NP- will route to her as FYI.   Thanks!

## 2021-12-31 NOTE — Telephone Encounter (Signed)
I spoke with the patient and he reports that  still is having some shortness of breath and he feels a pain in the left side of his chest.  He has seen his PCP for [facility] mountain spotted fever. He has a follow up on 01/02/22.   He is wanting to know if he would need to get a stress test completed. He is not sure what makes the episodes that makes his pass out when he does a lot of exertion. Please advise.

## 2021-12-31 NOTE — Telephone Encounter (Signed)
Called pateint back and let him know of Dr. Tomi Likens recommendations. Patient has upcoming MRI and EMG

## 2021-12-31 NOTE — Telephone Encounter (Signed)
The patient has Barrett's esophagus, which I have discussed with him in the past.  Due to this he needs to undergo regular endoscopies to make sure this does not progress to cancer.  His next EGD should be performed 3 years after his most recent endoscopy.  If he is still having significant symptoms, we need to make sure that his medication is actually suppressing the acid production.  For this, we could refer him for pH impedance testing for 24 hours at [facility] while on medication to determine if he is still presenting some degree of acid reflux.  Please let me know if he is interested in performing this testing.

## 2021-12-31 NOTE — Assessment & Plan Note (Signed)
Fatigue and dyspnea on exertion Has decreased DLCO on PFT with mild RV dilation Uses Advair and PRN Albuterol Rheumatology eval done for possible mixed connective tissue disorder - elevated CK level and elevated ENA RNP antibody

## 2021-12-31 NOTE — Assessment & Plan Note (Signed)
Had started Prozac 10 mg daily, uncontrolled currently Increased dose of Prozac, 20 mg QD now Hydroxyzine as needed for anxiety

## 2021-12-31 NOTE — Telephone Encounter (Signed)
Patient is returning a call to the office.

## 2021-12-31 NOTE — Telephone Encounter (Signed)
Called patient and left voicemail message.

## 2021-12-31 NOTE — Telephone Encounter (Signed)
Noted. Thanks.

## 2021-12-31 NOTE — Patient Instructions (Signed)
Please start taking Doxycycline as prescribed.  Please start taking Prozac 20 mg instead of 10 mg.  Please continue taking other medications as prescribed.

## 2022-01-01 NOTE — Telephone Encounter (Signed)
Called and spoke with the pt to see how he is doing this morning. He reports that he feels good right now. Not having any left CP today as mentioned in previous documentation from yesterday. He says that the CP comes and goes and is not something new. He has planned OV with Dr Erin Fulling tomorrow and I encouraged him to keep this appt to discuss further. Advised that he should seek emergent care sooner should his symptoms get worse. Pt verbalized understanding. Nothing further needed.

## 2022-01-01 NOTE — Telephone Encounter (Signed)
Called and discussed with pt. Pt does want to proceed with this test.

## 2022-01-01 NOTE — Telephone Encounter (Signed)
Ok that's fine

## 2022-01-01 NOTE — Telephone Encounter (Signed)
Thanks. Hi Ann, can you please refer the patient for pH impedance testing for 24 hours - [facility] on PPI? Dx: GERD   Thanks,  Maylon Peppers, MD Gastroenterology and Hepatology [facility]

## 2022-01-02 ENCOUNTER — Other Ambulatory Visit: Payer: Self-pay

## 2022-01-02 ENCOUNTER — Encounter: Payer: Self-pay | Admitting: Pulmonary Disease

## 2022-01-02 ENCOUNTER — Ambulatory Visit: Payer: 59 | Admitting: Pulmonary Disease

## 2022-01-02 VITALS — BP 124/86 | HR 116 | Ht 75.0 in | Wt 255.0 lb

## 2022-01-02 DIAGNOSIS — R0609 Other forms of dyspnea: Secondary | ICD-10-CM | POA: Diagnosis not present

## 2022-01-02 DIAGNOSIS — R942 Abnormal results of pulmonary function studies: Secondary | ICD-10-CM | POA: Diagnosis not present

## 2022-01-02 DIAGNOSIS — G4733 Obstructive sleep apnea (adult) (pediatric): Secondary | ICD-10-CM

## 2022-01-02 NOTE — Patient Instructions (Signed)
You have moderate obstructive sleep apnea  We will order an auto-titrating CPAP machine and mask fitting session for you.  We will schedule you for a cardiopulmonary exercise test  Follow up in 2 months

## 2022-01-02 NOTE — Progress Notes (Signed)
Synopsis: Referred in September 2022 for Cough and shortness of breath  Subjective:   PATIENT ID: Elijah Shaffer GENDER: male DOB: [birth year], MRN: [id number]  HPI  Chief Complaint  Patient presents with   Follow-up    F/U on SOB. States his SOB has increased since last visit, especially at night.    Elijah Shaffer is a 41 year old male, never smoker with history of GERD, large hiatal hernia and seasonal allergies who returns to pulmonary clinic for follow up of dyspnea and fatigue.   He continues to have easy fatigability and dyspnea with exertion. Heart cath 2/1 shows normal coronary arteries and normal hemodynamics. ICS/LABA inhaler therapy stopped at last visit with no changes in his symptoms.  He is being evaluated by rheumatology for elevated CK level and RNP ab level. He is being evaluated for numbness and tingling symptoms by neurology and is ordered for an EMG and cervical spine MRI.   He went to urgent care on 2/16 for shortness of breath and chest pains. Given patient's concerns for tick exposures, lyme and [place] spotted fever labs ordered. RMSF IgG is negative and IgM is elevated at 1.67. D-dimer 2/16 was not elevated. He saw his PCP on 12/31/21 and was prescribed doxycyline but has not taken this medication yet.   Home sleep study 12/12/21 showed moderate obstructive sleep apnea.   OV 10/31/21 HRCT chest does not indicate ILD involvement. There is a large hiatal hernia noted. Pulmonary function tests show mild diffusion defect with normal spirometry and normal TLC. It was difficult to get consistent data during the pulmonary function testing as the patient was getting fatigued.  Echo from 02/24/21 showed mildly dilated RV with normal function.   CK level is mildly elevated at 356 and ENA RNP ab is elevated. Remaining inflammatory workup is unremarkable which included myositis panel, ANA, CCP, dsDNA, RF, ESR, CRP and ddimer. Hypersensitivity pneumonitis panel is  negative.  He continues to have exertional dyspnea and easily fatigued with exertion. He describes a pre-syncopal event when working recently. He continues to work as a Administrator.   He reports a small raised dry patch of skin on left lower extremity, lateral side and similar rash on right lateral ankle that is itchy. He also reports feeling pain in his legs when laying down at night, even with out a hard days work.    OV 10/02/21 He called in after being seen on 11/18 complaining of similar complaints as discussed in the office visit that day. He complains of more fatigue than normal and shortness of breath with exertion. He has felt lightheaded with exertion before. He feels very run down. He denies any fevers or chills. His appetite remains good.  OV 09/28/21 He continues to experience exertional dyspnea. He was trialed on as needed albuterol with some improvement but continues to experience significant dyspnea with exertion. For example, he was short of breath walking around [employer] this past week. He denies wheezing. He does have cough with greenish-yellowish sputum.   Nasal congestion is improved with fluticasone nasal spray. He is in leaf season now as a Administrator.   He was unable to have PFTs done since last visit as his mother was recently diagnosed with cervical cancer and they were occupied with her treatment schedule.   He denies leg swelling, chest pain or syncopal episodes.  He raised the question with his previous work exposures using round-up and other herbicide chemicals could lead to his breathing issues.  OV  07/24/21 He reports shortness of breath over the last 4 months.  The shortness of breath is evident with exertion and resolves with rest.  Patient has been treated for tickborne illness with concern for Ehrlichia and has been on doxycycline.  He is being evaluated by infectious disease today.  He has been much more inactive over these last 4 months.  He works as a  Administrator and has not been working as much due to the shortness of breath.  He denies any wheezing or chest tightness.  He has intermittent dry cough that is occasionally productive with sputum.  He is being treated with PPI therapy for GERD and has a small hiatal hernia on CT imaging.  He does report seasonal allergies with nasal and sinus congestion more so in the spring and fall.  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  He denies any history of blood clots.  No family history of blood clots.  No family history of lung disease.  No history of smoking or vaping.  He does report some intermittent secondhand smoke exposure via social gatherings.  He did grow up in a home with a wood stove.  Past Medical History:  Diagnosis Date   Acute renal injury (HCC) 06/28/2017   Anxiety    Aspiration pneumonia of right upper lobe due to gastric secretions (HCC)    Diverticulitis    GERD (gastroesophageal reflux disease) 01/08/2018   Seasonal allergies    Syncope 06/26/2021     Family History  Problem Relation Age of Onset   Healthy Mother    Cancer Father    Colon cancer Maternal Grandmother    Colon cancer Maternal Grandfather      Social History   Socioeconomic History   Marital status: Single    Spouse name: Not on file   Number of children: Not on file   Years of education: Not on file   Highest education level: Not on file  Occupational History   Not on file  Tobacco Use   Smoking status: Never   Smokeless tobacco: Never  Vaping Use   Vaping Use: Never used  Substance and Sexual Activity   Alcohol use: Yes    Comment: occ   Drug use: No   Sexual activity: Not on file  Other Topics Concern   Not on file  Social History Narrative   Not on file   Social Determinants of Health   Financial Resource Strain: Not on file  Food Insecurity: Not on file  Transportation Needs: Not on file  Physical Activity: Not on file  Stress: Not on file  Social Connections:  Not on file  Intimate Partner Violence: Not on file     Allergies  Allergen Reactions   Claritin [Loratadine] Other (See Comments)    Heart race   Pollen Extract Other (See Comments)    Seasonal allergies     Outpatient Medications Prior to Visit  Medication Sig Dispense Refill   acetaminophen (TYLENOL) 500 MG tablet Take 500-1,000 mg by mouth every 6 (six) hours as needed for moderate pain or mild pain.     albuterol (VENTOLIN HFA) 108 (90 Base) MCG/ACT inhaler Inhale 2 puffs into the lungs every 6 (six) hours as needed for shortness of breath. 8 g 0   aspirin EC 81 MG tablet Take 81 mg by mouth daily. Swallow whole.     b complex vitamins capsule Take 1 capsule by mouth daily. With D3 gummie  betamethasone dipropionate 0.05 % cream Apply 1 application topically 2 (two) times daily as needed (Rash).     cetirizine (ZYRTEC) 10 MG tablet Take 10 mg by mouth daily.     diphenhydrAMINE-zinc acetate (BENADRYL EXTRA STRENGTH) cream Apply 1 application topically 3 (three) times daily as needed for itching. 28.4 g 0   docusate sodium (COLACE) 100 MG capsule Take 100 mg by mouth 2 (two) times daily.     FLUoxetine (PROZAC) 20 MG capsule Take 1 capsule (20 mg total) by mouth daily. 90 capsule 1   fluticasone (FLONASE ALLERGY RELIEF) 50 MCG/ACT nasal spray Place 2 sprays into both nostrils daily as needed for allergies or rhinitis. (Patient taking differently: Place 2 sprays into both nostrils daily.) 9.9 mL 2   guaiFENesin (MUCINEX) 600 MG 12 hr tablet Take 600 mg by mouth 2 (two) times daily as needed for to loosen phlegm.     hydrOXYzine (ATARAX/VISTARIL) 10 MG tablet Take 1 tablet (10 mg total) by mouth 3 (three) times daily as needed for anxiety. 30 tablet 0   meclizine (ANTIVERT) 12.5 MG tablet Take 12.5 mg by mouth daily as needed for dizziness.     montelukast (SINGULAIR) 10 MG tablet Take 1 tablet (10 mg total) by mouth at bedtime. 30 tablet 11   omega-3 acid ethyl esters (LOVAZA) 1 g  capsule Take 2 capsules (2 g total) by mouth 2 (two) times daily. 120 capsule 3   ondansetron (ZOFRAN) 4 MG tablet Take 1 tablet (4 mg total) by mouth every 6 (six) hours as needed for nausea. 20 tablet 0   pantoprazole (PROTONIX) 40 MG tablet Take 1 tablet (40 mg total) by mouth 2 (two) times daily. 180 tablet 0   Potassium 99 MG TABS Take 1 tablet by mouth daily.     triamcinolone cream (KENALOG) 0.1 % Apply 1 application topically 2 (two) times daily. 30 g 0   doxycycline (VIBRA-TABS) 100 MG tablet Take 1 tablet (100 mg total) by mouth 2 (two) times daily. 28 tablet 0   fluticasone-salmeterol (ADVAIR DISKUS) 250-50 MCG/ACT AEPB Inhale 1 puff into the lungs in the morning and at bedtime. 60 each 6   No facility-administered medications prior to visit.   Review of Systems  Constitutional:  Positive for malaise/fatigue. Negative for chills, fever and weight loss.  HENT:  Negative for congestion, sinus pain and sore throat.   Eyes: Negative.   Respiratory:  Positive for shortness of breath. Negative for cough, hemoptysis, sputum production and wheezing.   Cardiovascular:  Positive for chest pain. Negative for palpitations, orthopnea, claudication and leg swelling.  Gastrointestinal:  Positive for heartburn. Negative for abdominal pain, nausea and vomiting.  Genitourinary: Negative.   Musculoskeletal:  Negative for joint pain and myalgias.       Leg pain  Skin:  Negative for rash.  Neurological:  Positive for dizziness, tingling, sensory change and headaches. Negative for weakness.  Endo/Heme/Allergies: Negative.   Psychiatric/Behavioral:  Positive for depression. The patient is nervous/anxious.    Objective:   Vitals:   01/02/22 0858  BP: 124/86  Pulse: (!) 116  SpO2: 97%  Weight: 255 lb (115.7 kg)  Height: $Remove'6\' 3"'SnnGkEN$  (1.905 m)   Physical Exam Constitutional:      General: He is not in acute distress.    Appearance: He is not ill-appearing or toxic-appearing.  HENT:     Head:  Normocephalic and atraumatic.  Eyes:     Extraocular Movements: Extraocular movements intact.  Conjunctiva/sclera: Conjunctivae normal.     Pupils: Pupils are equal, round, and reactive to light.  Cardiovascular:     Rate and Rhythm: Normal rate and regular rhythm.     Pulses: Normal pulses.     Heart sounds: Normal heart sounds. No murmur heard. Pulmonary:     Effort: Pulmonary effort is normal.     Breath sounds: No decreased air movement. Decreased breath sounds present. No wheezing, rhonchi or rales.  Musculoskeletal:     Right lower leg: No edema.     Left lower leg: No edema.  Skin:    General: Skin is warm and dry.     Findings: No rash.  Neurological:     General: No focal deficit present.     Mental Status: He is alert.   CBC    Component Value Date/Time   WBC 8.2 12/27/2021 1443   RBC 5.68 12/27/2021 1443   HGB 16.7 12/27/2021 1443   HGB 17.2 12/10/2021 1107   HCT 49.8 12/27/2021 1443   HCT 50.6 12/10/2021 1107   PLT 257 12/27/2021 1443   PLT 232 12/10/2021 1107   MCV 87.7 12/27/2021 1443   MCV 85 12/10/2021 1107   MCH 29.4 12/27/2021 1443   MCHC 33.5 12/27/2021 1443   RDW 12.4 12/27/2021 1443   RDW 12.1 12/10/2021 1107   LYMPHSABS 2.1 10/02/2021 1017   LYMPHSABS 2.7 07/18/2021 0831   MONOABS 0.5 10/02/2021 1017   EOSABS 0.0 10/02/2021 1017   EOSABS 0.1 07/18/2021 0831   BASOSABS 0.0 10/02/2021 1017   BASOSABS 0.1 07/18/2021 0831   BMP Latest Ref Rng & Units 12/27/2021 12/12/2021 12/12/2021  Glucose 70 - 99 mg/dL 83 - -  BUN 6 - 20 mg/dL 19 - -  Creatinine 0.61 - 1.24 mg/dL 1.31(H) - -  BUN/Creat Ratio 9 - 20 - - -  Sodium 135 - 145 mmol/L 134(L) 142 143  Potassium 3.5 - 5.1 mmol/L 3.8 3.4(L) 3.7  Chloride 98 - 111 mmol/L 104 - -  CO2 22 - 32 mmol/L 20(L) - -  Calcium 8.9 - 10.3 mg/dL 9.2 - -   Chest imaging: HRCT Chest 10/17/21 1. No findings to suggest interstitial lung disease. No acute findings in the thorax to account for the patient's  symptoms. 2. Small pulmonary nodules measuring up to 5 mm in the lateral segment of the right middle lobe, nonspecific, but statistically likely benign. No follow-up needed if patient is low-risk (and has no known or suspected primary neoplasm). Non-contrast chest CT can be considered in 12 months if patient is high-risk.  3. Large hiatal hernia. 4. Mild hepatic steatosis. 5. Colonic diverticulosis  CXR 10/03/21 Increased interstitial prominence bilaterally with medial right upper lobe opacity along the mediastinal border.   CXR 07/19/21 Mild scarring of right lateral base. Small to moderate hiatal hernia.  Increased retrosternal airspace  PFT: PFT Results Latest Ref Rng & Units 10/30/2021  FVC-Pre L 5.65  FVC-Predicted Pre % 91  FVC-Post L 5.94  FVC-Predicted Post % 96  Pre FEV1/FVC % % 88  Post FEV1/FCV % % 82  FEV1-Pre L 4.97  FEV1-Predicted Pre % 101  FEV1-Post L 4.88  DLCO uncorrected ml/min/mmHg 24.00  DLCO UNC% % 66  DLCO corrected ml/min/mmHg 23.19  DLCO COR %Predicted % 64  DLVA Predicted % 88  TLC L 7.27  TLC % Predicted % 91  RV % Predicted % 55  10/30/21: Mild diffusion defect with normal spirometry and TLC.  Echo 02/24/21: LV EF  60-65%. Normal diastolic parameters. RV systolic function is normal. RV is mildly enlarged.   [facility] 12/12/21: RA = 4 RV = 23/4 PA = 22/6 (14) PCW = 7 Fick cardiac output/index = 7.1/3.0 PVR = 1.0 WU Ao sat = 95% PA sat = 76%, 76% SVC sat = 74%  Assessment & Plan:   OSA (obstructive sleep apnea) - Plan: Ambulatory Referral for DME  Dyspnea on exertion - Plan: Cardiopulmonary exercise test  Decreased diffusion capacity - Plan: Cardiopulmonary exercise test  Discussion: Elijah Shaffer is a 41 year old male, never smoker with history of GERD, large hiatal hernia and seasonal allergies who returns to pulmonary clinic for follow up of dyspnea and fatigue.   HRCT Chest is reassuring that there is no parenchymal or airway involvement.  Pulmonary function tests show mild diffusion defect from unknown cause at this time. TTE from 02/2021 showed mildly enlarged RV and RHC showed normal hemodynamics on 2/1. He continues to have exertional dyspnea and intermittent pre-syncopal episodes with exertion. We will order him a cardiopulmonary exercise test for further evaluation.   He has moderate sleep apnea based on recent home sleep study. We have ordered CPAP with auto-titration 4 -15 cmH20 with humidification and a mask fitting.   He is holding off on course of doxycyline, which I agree with until he sees infectious disease later this month. I discussed with him that the RMSF IgM antibody is non-specific in this setting.  Follow up in 2 months.  Freda Jackson, MD [facility] Office: [phone]   Current Outpatient Medications:    acetaminophen (TYLENOL) 500 MG tablet, Take 500-1,000 mg by mouth every 6 (six) hours as needed for moderate pain or mild pain., Disp: , Rfl:    albuterol (VENTOLIN HFA) 108 (90 Base) MCG/ACT inhaler, Inhale 2 puffs into the lungs every 6 (six) hours as needed for shortness of breath., Disp: 8 g, Rfl: 0   aspirin EC 81 MG tablet, Take 81 mg by mouth daily. Swallow whole., Disp: , Rfl:    b complex vitamins capsule, Take 1 capsule by mouth daily. With D3 gummie, Disp: , Rfl:    betamethasone dipropionate 0.05 % cream, Apply 1 application topically 2 (two) times daily as needed (Rash)., Disp: , Rfl:    cetirizine (ZYRTEC) 10 MG tablet, Take 10 mg by mouth daily., Disp: , Rfl:    diphenhydrAMINE-zinc acetate (BENADRYL EXTRA STRENGTH) cream, Apply 1 application topically 3 (three) times daily as needed for itching., Disp: 28.4 g, Rfl: 0   docusate sodium (COLACE) 100 MG capsule, Take 100 mg by mouth 2 (two) times daily., Disp: , Rfl:    FLUoxetine (PROZAC) 20 MG capsule, Take 1 capsule (20 mg total) by mouth daily., Disp: 90 capsule, Rfl: 1   fluticasone (FLONASE ALLERGY RELIEF) 50  MCG/ACT nasal spray, Place 2 sprays into both nostrils daily as needed for allergies or rhinitis. (Patient taking differently: Place 2 sprays into both nostrils daily.), Disp: 9.9 mL, Rfl: 2   guaiFENesin (MUCINEX) 600 MG 12 hr tablet, Take 600 mg by mouth 2 (two) times daily as needed for to loosen phlegm., Disp: , Rfl:    hydrOXYzine (ATARAX/VISTARIL) 10 MG tablet, Take 1 tablet (10 mg total) by mouth 3 (three) times daily as needed for anxiety., Disp: 30 tablet, Rfl: 0   meclizine (ANTIVERT) 12.5 MG tablet, Take 12.5 mg by mouth daily as needed for dizziness., Disp: , Rfl:    montelukast (SINGULAIR) 10 MG tablet, Take 1  tablet (10 mg total) by mouth at bedtime., Disp: 30 tablet, Rfl: 11   omega-3 acid ethyl esters (LOVAZA) 1 g capsule, Take 2 capsules (2 g total) by mouth 2 (two) times daily., Disp: 120 capsule, Rfl: 3   ondansetron (ZOFRAN) 4 MG tablet, Take 1 tablet (4 mg total) by mouth every 6 (six) hours as needed for nausea., Disp: 20 tablet, Rfl: 0   pantoprazole (PROTONIX) 40 MG tablet, Take 1 tablet (40 mg total) by mouth 2 (two) times daily., Disp: 180 tablet, Rfl: 0   Potassium 99 MG TABS, Take 1 tablet by mouth daily., Disp: , Rfl:    triamcinolone cream (KENALOG) 0.1 %, Apply 1 application topically 2 (two) times daily., Disp: 30 g, Rfl: 0

## 2022-01-03 DIAGNOSIS — I471 Supraventricular tachycardia: Secondary | ICD-10-CM | POA: Diagnosis not present

## 2022-01-03 NOTE — Addendum Note (Signed)
Encounter addended by: Micki Riley, RN on: 01/03/2022 2:40 PM  Actions taken: Imaging Exam ended

## 2022-01-05 ENCOUNTER — Encounter: Payer: Self-pay | Admitting: Pulmonary Disease

## 2022-01-07 ENCOUNTER — Telehealth: Payer: Self-pay | Admitting: *Deleted

## 2022-01-07 NOTE — Telephone Encounter (Signed)
Please advise 

## 2022-01-07 NOTE — Telephone Encounter (Signed)
° °  Telephone encounter was:  Unsuccessful.  01/07/2022 Name: Elijah Shaffer MRN: [id number] DOB: 25-Sep-1981  Unsuccessful outbound call made today to assist with:   Dental   Outreach Attempt:  3rd Attempt.  Referral closed unable to contact patient.  A HIPAA compliant voice message was left requesting a return call.  Instructed patient to call back at    Instructed patient to call back at [phone]  at their earliest convenience.    [facility], Care Management  [phone] [address] , [place] [address] Email : [email or web address]  .

## 2022-01-07 NOTE — Telephone Encounter (Signed)
° °  Telephone encounter was:  Successful.  01/07/2022 Name: [name] MRN: [id number] DOB: [birth year]  Elijah Shaffer is a 41 y.o. year old male who is a primary care patient of Lindell Spar, MD . The community resource team was consulted for assistance with  dental  Care guide performed the following interventions: Patient provided with information about care guide support team and interviewed to confirm resource needs Follow up call placed to community resources to determine status of patients referral. Patient reached back out for dental resources told him there are very few but would mail him what I have  Follow Up Plan:  No further follow up planned at this time. The patient has been provided with needed resources.  [facility], Care Management  [phone] [address] , [place] [address] Email : [email or web address]

## 2022-01-08 ENCOUNTER — Ambulatory Visit
Admission: RE | Admit: 2022-01-08 | Discharge: 2022-01-08 | Disposition: A | Discharge status: Home / Self Care | Payer: 59 | Source: Ambulatory Visit | Attending: Neurology | Admitting: Neurology

## 2022-01-08 ENCOUNTER — Encounter: Payer: Self-pay | Admitting: Rheumatology

## 2022-01-08 ENCOUNTER — Ambulatory Visit: Payer: 59 | Admitting: Rheumatology

## 2022-01-08 ENCOUNTER — Ambulatory Visit: Payer: 59 | Admitting: Internal Medicine

## 2022-01-08 ENCOUNTER — Other Ambulatory Visit: Payer: Self-pay

## 2022-01-08 ENCOUNTER — Encounter: Payer: Self-pay | Admitting: Internal Medicine

## 2022-01-08 VITALS — BP 135/90 | HR 72 | Temp 97.6°F | Wt 253.0 lb

## 2022-01-08 VITALS — BP 126/88 | HR 112 | Ht 75.0 in | Wt 252.4 lb

## 2022-01-08 DIAGNOSIS — F419 Anxiety disorder, unspecified: Secondary | ICD-10-CM

## 2022-01-08 DIAGNOSIS — R292 Abnormal reflex: Secondary | ICD-10-CM

## 2022-01-08 DIAGNOSIS — R270 Ataxia, unspecified: Secondary | ICD-10-CM | POA: Diagnosis not present

## 2022-01-08 DIAGNOSIS — M50221 Other cervical disc displacement at C4-C5 level: Secondary | ICD-10-CM | POA: Diagnosis not present

## 2022-01-08 DIAGNOSIS — S199XXA Unspecified injury of neck, initial encounter: Secondary | ICD-10-CM | POA: Diagnosis not present

## 2022-01-08 DIAGNOSIS — R748 Abnormal levels of other serum enzymes: Secondary | ICD-10-CM

## 2022-01-08 DIAGNOSIS — K449 Diaphragmatic hernia without obstruction or gangrene: Secondary | ICD-10-CM

## 2022-01-08 DIAGNOSIS — K2270 Barrett's esophagus without dysplasia: Secondary | ICD-10-CM | POA: Diagnosis not present

## 2022-01-08 DIAGNOSIS — K219 Gastro-esophageal reflux disease without esophagitis: Secondary | ICD-10-CM | POA: Diagnosis not present

## 2022-01-08 DIAGNOSIS — R0602 Shortness of breath: Secondary | ICD-10-CM

## 2022-01-08 DIAGNOSIS — E782 Mixed hyperlipidemia: Secondary | ICD-10-CM

## 2022-01-08 DIAGNOSIS — Z86010 Personal history of colonic polyps: Secondary | ICD-10-CM

## 2022-01-08 DIAGNOSIS — A770 Spotted fever due to Rickettsia rickettsii: Secondary | ICD-10-CM | POA: Diagnosis not present

## 2022-01-08 DIAGNOSIS — G4730 Sleep apnea, unspecified: Secondary | ICD-10-CM

## 2022-01-08 DIAGNOSIS — R2681 Unsteadiness on feet: Secondary | ICD-10-CM

## 2022-01-08 DIAGNOSIS — L309 Dermatitis, unspecified: Secondary | ICD-10-CM

## 2022-01-08 DIAGNOSIS — A7740 Ehrlichiosis, unspecified: Secondary | ICD-10-CM

## 2022-01-08 DIAGNOSIS — R251 Tremor, unspecified: Secondary | ICD-10-CM | POA: Diagnosis not present

## 2022-01-08 MED ORDER — GADOBENATE DIMEGLUMINE 529 MG/ML IV SOLN
20.0000 mL | Freq: Once | INTRAVENOUS | Status: AC | PRN
  Administered 2022-01-08: 20 mL via INTRAVENOUS

## 2022-01-08 NOTE — Progress Notes (Signed)
[facility]  CHIEF COMPLAINT:    Follow up for concern for RMSF  SUBJECTIVE:    Elijah Shaffer is a 41 y.o. male with PMHx as below who presents to the clinic for RMSF.   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  He was reassured that his ongoing symptoms was not due to ticks.  He recently went to urgent care on 2/16 for SOB and chest pains.  Again there were concerns for tick exposure.  RMSF and Lyme tests were done.  Lyme was negative and RMSF IgM was borderline elevated 1.67.  IgG was negative. CBC was normal.  He saw his PCP a few days later and was prescribed doxycycline.  This prescription appears to have been dispensed on 12/31/21 for a 14 day supply.  He reports not taking this prescription.  He also had a visit with his PCP in January where he was prescribed doxycyline for 7 days due to report of multiple tick bites.  He reports ongoing tick exposure due to his occupation but no new or recent ticks that he is aware of.  Please see A&P for the details of today's visit and status of the patient's medical problems.   Patient's Medications  New Prescriptions   No medications on file  Previous Medications   ACETAMINOPHEN (TYLENOL) 500 MG TABLET    Take 500-1,000 mg by mouth every 6 (six) hours as needed for moderate pain or mild pain.   ALBUTEROL (VENTOLIN HFA) 108 (90 BASE) MCG/ACT INHALER    Inhale 2 puffs into the lungs every 6 (six) hours as needed for shortness of breath.   ASPIRIN EC 81 MG TABLET    Take 81 mg by mouth daily. Swallow whole.   B COMPLEX VITAMINS CAPSULE    Take 1 capsule by mouth daily. With D3 gummie   BETAMETHASONE DIPROPIONATE 0.05 % CREAM    Apply 1 application topically 2 (two) times daily as needed (Rash).   CETIRIZINE (ZYRTEC) 10 MG TABLET    Take 10 mg by mouth daily.    DIPHENHYDRAMINE-ZINC ACETATE (BENADRYL EXTRA STRENGTH) CREAM    Apply 1 application topically 3 (three) times daily as needed for itching.   DOCUSATE SODIUM (COLACE) 100 MG CAPSULE    Take 100 mg by mouth 2 (two) times daily.   FLUOXETINE (PROZAC) 20 MG CAPSULE    Take 1 capsule (20 mg total) by mouth daily.   FLUTICASONE (FLONASE ALLERGY RELIEF) 50 MCG/ACT NASAL SPRAY    Place 2 sprays into both nostrils daily as needed for allergies or rhinitis.   GUAIFENESIN (MUCINEX) 600 MG 12 HR TABLET    Take 600 mg by mouth 2 (two) times daily as needed for to loosen phlegm.   HYDROXYZINE (ATARAX/VISTARIL) 10 MG TABLET    Take 1 tablet (10 mg total) by mouth 3 (three) times daily as needed for anxiety.   MECLIZINE (ANTIVERT) 12.5 MG TABLET    Take 12.5 mg by mouth daily as needed for dizziness.   MONTELUKAST (SINGULAIR) 10 MG TABLET    Take 1 tablet (10 mg total) by mouth at bedtime.   OMEGA-3 ACID ETHYL ESTERS (LOVAZA) 1 G CAPSULE    Take 2 capsules (2 g total) by mouth 2 (two) times daily.   ONDANSETRON (ZOFRAN) 4 MG TABLET  Take 1 tablet (4 mg total) by mouth every 6 (six) hours as needed for nausea.   PANTOPRAZOLE (PROTONIX) 40 MG TABLET    Take 1 tablet (40 mg total) by mouth 2 (two) times daily.   POTASSIUM 99 MG TABS    Take 1 tablet by mouth daily.   TRIAMCINOLONE CREAM (KENALOG) 0.1 %    Apply 1 application topically 2 (two) times daily.  Modified Medications   No medications on file  Discontinued Medications   No medications on file      Past Medical History:  Diagnosis Date   Acute renal injury ([place]) 06/28/2017   Anxiety    Aspiration pneumonia of right upper lobe due to gastric secretions ([facility]CC)    Diverticulitis    GERD (gastroesophageal reflux disease) 01/08/2018   Seasonal allergies    Syncope 06/26/2021    Social History   Tobacco Use   Smoking status: Never   Smokeless tobacco: Never  Vaping Use   Vaping Use: Never used  Substance Use Topics   Alcohol use: Yes    Comment:  occ   Drug use: No    Family History  Problem Relation Age of Onset   Healthy Mother    Cancer Father    Colon cancer Maternal Grandmother    Colon cancer Maternal Grandfather     Allergies  Allergen Reactions   Claritin [Loratadine] Other (See Comments)    Heart race   Pollen Extract Other (See Comments)    Seasonal allergies    Review of Systems  Constitutional:  Positive for malaise/fatigue. Negative for fever.  Respiratory:  Positive for shortness of breath.   Gastrointestinal: Negative.   Musculoskeletal: Negative.   Skin:  Negative for rash.  Neurological:  Positive for dizziness and headaches.  Psychiatric/Behavioral:  The patient is nervous/anxious.   All other systems reviewed and are negative.   OBJECTIVE:    Vitals:   01/08/22 0833  BP: 135/90  Pulse: 72  Temp: 97.6 F (36.4 C)  TempSrc: Temporal  Weight: 253 lb (114.8 kg)   Body mass index is 31.62 kg/m.  Physical Exam Constitutional:      General: He is not in acute distress.    Appearance: Normal appearance.  HENT:     Head: Normocephalic and atraumatic.  Eyes:     Extraocular Movements: Extraocular movements intact.     Conjunctiva/sclera: Conjunctivae normal.  Pulmonary:     Effort: Pulmonary effort is normal. No respiratory distress.  Abdominal:     General: There is no distension.     Palpations: Abdomen is soft.  Musculoskeletal:        General: Normal range of motion.     Cervical back: Normal range of motion and neck supple.  Skin:    General: Skin is warm and dry.     Findings: Rash present.     Comments: Right ankle with small scaly, eczematous rash.  Neurological:     General: No focal deficit present.     Mental Status: He is alert and oriented to person, place, and time.  Psychiatric:        Mood and Affect: Mood normal.        Behavior: Behavior normal.     Labs and Microbiology: CBC Latest Ref Rng & Units 12/27/2021 12/12/2021 12/12/2021  WBC 4.0 - 10.5 K/uL 8.2 - -   Hemoglobin 13.0 - 17.0 g/dL 16.7 14.6 15.3  Hematocrit 39.0 - 52.0 % 49.8 43.0 45.0  Platelets 150 -  400 K/uL 257 - -   CMP Latest Ref Rng & Units 12/27/2021 12/12/2021 12/12/2021  Glucose 70 - 99 mg/dL 83 - -  BUN 6 - 20 mg/dL 19 - -  Creatinine 0.61 - 1.24 mg/dL 1.31(H) - -  Sodium 135 - 145 mmol/L 134(L) 142 143  Potassium 3.5 - 5.1 mmol/L 3.8 3.4(L) 3.7  Chloride 98 - 111 mmol/L 104 - -  CO2 22 - 32 mmol/L 20(L) - -  Calcium 8.9 - 10.3 mg/dL 9.2 - -  Total Protein 6.0 - 8.5 g/dL - - -  Total Bilirubin 0.0 - 1.2 mg/dL - - -  Alkaline Phos 44 - 121 IU/L - - -  AST 0 - 40 IU/L - - -  ALT 0 - 44 IU/L - - -      ASSESSMENT & PLAN:    [place] spotted fever Discussed with patient that isolated IgM for RMSF is not diagnostic for infection as can have false positives and would be unusual to have tick borne infection this time of year.  Additionally, his symptoms and presentation is not consistent with RMSF.  Discussed tick infection, symptoms, long term effects, and no role for further antibiotics once treated .  No further follow up needed and would not recommend doxycycline.     [facility] 01/08/2022, 8:58 AM   I spent 40 minutes dedicated to the care of this patient on the date of this encounter to include pre-visit review of records, face-to-face time with the patient discussing tick borne infections, and post-visit ordering of testing.

## 2022-01-08 NOTE — Assessment & Plan Note (Signed)
Discussed with patient that isolated IgM for RMSF is not diagnostic for infection as can have false positives and would be unusual to have tick borne infection this time of year.  Additionally, his symptoms and presentation is not consistent with RMSF.  Discussed tick infection, symptoms, long term effects, and no role for further antibiotics once treated .  No further follow up needed and would not recommend doxycycline.

## 2022-01-09 ENCOUNTER — Telehealth: Payer: Self-pay | Admitting: Pulmonary Disease

## 2022-01-09 ENCOUNTER — Telehealth: Payer: Self-pay | Admitting: Neurology

## 2022-01-09 ENCOUNTER — Ambulatory Visit (HOSPITAL_COMMUNITY): Payer: 59 | Attending: Pulmonary Disease

## 2022-01-09 DIAGNOSIS — R942 Abnormal results of pulmonary function studies: Secondary | ICD-10-CM | POA: Insufficient documentation

## 2022-01-09 DIAGNOSIS — R0609 Other forms of dyspnea: Secondary | ICD-10-CM | POA: Insufficient documentation

## 2022-01-09 DIAGNOSIS — R292 Abnormal reflex: Secondary | ICD-10-CM

## 2022-01-09 DIAGNOSIS — R0789 Other chest pain: Secondary | ICD-10-CM | POA: Insufficient documentation

## 2022-01-09 DIAGNOSIS — R42 Dizziness and giddiness: Secondary | ICD-10-CM | POA: Insufficient documentation

## 2022-01-09 DIAGNOSIS — M791 Myalgia, unspecified site: Secondary | ICD-10-CM

## 2022-01-09 HISTORY — PX: OTHER SURGICAL HISTORY: SHX169

## 2022-01-09 NOTE — Telephone Encounter (Signed)
Patient called and stated he had a stress test today and rode the bicycle.  He stated he is having arm tremors.  When he got off the bike his neck and head were shaky like his arm.  He wanted to let the dr know.  ?

## 2022-01-10 NOTE — Telephone Encounter (Signed)
Patient is returning phone call. Patient phone number is [phone]. ?

## 2022-01-10 NOTE — Telephone Encounter (Signed)
LMOVM for pt to call the office back.  ?

## 2022-01-10 NOTE — Telephone Encounter (Signed)
Tried calling pt. How patient doing.  ?Dr.Jaffe out of the office for the rest of the week.  ?

## 2022-01-10 NOTE — Telephone Encounter (Signed)
ATC LVMTCB x 1  

## 2022-01-10 NOTE — Telephone Encounter (Signed)
Patient returned call to [name]. 

## 2022-01-10 NOTE — Telephone Encounter (Signed)
Patient is returning phone call. Patient phone number is [phone]. ?

## 2022-01-10 NOTE — Telephone Encounter (Signed)
Called and left voicemail for patient to call office back in regards to stress test and to gather more information from the patient  ?

## 2022-01-10 NOTE — Telephone Encounter (Signed)
See phone note from 01/09/22.  ?

## 2022-01-10 NOTE — Telephone Encounter (Signed)
Pt called in and left a message. He was trying to return [name]'s call ?

## 2022-01-10 NOTE — Telephone Encounter (Signed)
Spoke with pt who stated he started to have tremors in arms, legs, head and neck after completion of stress test yesterday. He also wants to know results of stress test and would like Dr. Erin Fulling to know he did get C Pap today and did feel like he slept better during his nap while using it. Dr. Erin Fulling please advise on stress test results.  ?

## 2022-01-10 NOTE — Telephone Encounter (Signed)
ATC LVMTCB x 2 

## 2022-01-11 ENCOUNTER — Telehealth: Payer: Self-pay | Admitting: Neurology

## 2022-01-11 NOTE — Telephone Encounter (Signed)
Called and spoke with pt letting him know the results of stress test per JD and he verbalized understanding. Nothing further needed. ?

## 2022-01-11 NOTE — Telephone Encounter (Signed)
EMG scheduled.

## 2022-01-11 NOTE — Telephone Encounter (Signed)
Per pt he was seeing the muscle provider. While in that visit he was asked to hold his arms out, While doing that his arms are shaking. So He was sent down to a stress test. During the stress test his right arm was shaking and legs. ? ?After the test his legs were shaking real bad.  ?Now his arms are shaking more. While on the phone patient stated his right arm is shaking. ? ?Pt wants to know what to do?  ?

## 2022-01-11 NOTE — Telephone Encounter (Signed)
-----   Message from Pieter Partridge, DO sent at 01/09/2022  3:52 PM EST ----- ?MRI of cervical spine does not reveal any cause for his symptoms.  For myalgias, I would like to check NCV-EMG of right arm and leg. ?

## 2022-01-11 NOTE — Telephone Encounter (Signed)
See encounter for 01/10/22 ?

## 2022-01-11 NOTE — Telephone Encounter (Signed)
Pt called in and left a message. Returning a call. ?

## 2022-01-11 NOTE — Telephone Encounter (Signed)
Patient is returning a call to sheena, said they are playing phone tag. ?

## 2022-01-11 NOTE — Telephone Encounter (Signed)
These are his stress test results: ?Conclusion: The interpretation of this test is limited due to submaximal effort during the exercise. Based on available data, exercise testing with gas exchange demonstrates severe functional impairment when compared to matched sedentary norms. There is no cardiopulmonary limitation. Patient presents with tremor and general weakness that suggests clinical correlation with neuromuscular evaluation.  ? ?He should keep his follow up with Dr. Tomi Likens in neurology for further workup of possible neuromuscular disorder. I see he already had cervical spine MRI completed and I believe there was an EMG test ordered.  ? ?Thanks, ?JD ?

## 2022-01-14 ENCOUNTER — Other Ambulatory Visit: Payer: Self-pay

## 2022-01-14 ENCOUNTER — Encounter: Payer: Self-pay | Admitting: Nurse Practitioner

## 2022-01-14 ENCOUNTER — Ambulatory Visit: Payer: 59 | Admitting: Nurse Practitioner

## 2022-01-14 VITALS — BP 132/90 | HR 109 | Ht 75.0 in | Wt 250.4 lb

## 2022-01-14 DIAGNOSIS — R002 Palpitations: Secondary | ICD-10-CM | POA: Diagnosis not present

## 2022-01-14 DIAGNOSIS — I10 Essential (primary) hypertension: Secondary | ICD-10-CM

## 2022-01-14 DIAGNOSIS — R42 Dizziness and giddiness: Secondary | ICD-10-CM

## 2022-01-14 DIAGNOSIS — I272 Pulmonary hypertension, unspecified: Secondary | ICD-10-CM | POA: Diagnosis not present

## 2022-01-14 DIAGNOSIS — R0609 Other forms of dyspnea: Secondary | ICD-10-CM | POA: Diagnosis not present

## 2022-01-14 DIAGNOSIS — R251 Tremor, unspecified: Secondary | ICD-10-CM

## 2022-01-14 DIAGNOSIS — E781 Pure hyperglyceridemia: Secondary | ICD-10-CM

## 2022-01-14 DIAGNOSIS — I440 Atrioventricular block, first degree: Secondary | ICD-10-CM

## 2022-01-14 DIAGNOSIS — G4733 Obstructive sleep apnea (adult) (pediatric): Secondary | ICD-10-CM | POA: Diagnosis not present

## 2022-01-14 DIAGNOSIS — R0789 Other chest pain: Secondary | ICD-10-CM | POA: Diagnosis not present

## 2022-01-14 DIAGNOSIS — R531 Weakness: Secondary | ICD-10-CM

## 2022-01-14 DIAGNOSIS — I471 Supraventricular tachycardia: Secondary | ICD-10-CM | POA: Diagnosis not present

## 2022-01-14 DIAGNOSIS — I441 Atrioventricular block, second degree: Secondary | ICD-10-CM

## 2022-01-14 MED ORDER — PROPRANOLOL HCL 20 MG PO TABS
ORAL_TABLET | ORAL | 3 refills | Status: DC
Start: 2022-01-14 — End: 2022-01-14

## 2022-01-14 MED ORDER — PROPRANOLOL HCL 20 MG PO TABS
ORAL_TABLET | ORAL | 3 refills | Status: DC
Start: 2022-01-14 — End: 2022-03-25

## 2022-01-14 NOTE — Patient Instructions (Addendum)
Medication Instructions:  ?Propranolol 20 mg twice daily as needed for palpitations  ? ?*If you need a refill on your cardiac medications before your next appointment, please call your pharmacy* ? ? ?Lab Work: ?NONE ordered at this time of appointment  ? ? ?Testing/Procedures: ?NONE ordered at this time of appointment  ? ? ?Follow-Up: ?At [facility], you and your health needs are our priority.  As part of our continuing mission to provide you with exceptional heart care, we have created designated Provider Care Teams.  These Care Teams include your primary Cardiologist (physician) and Advanced Practice Providers (APPs -  Physician Assistants and Nurse Practitioners) who all work together to provide you with the care you need, when you need it. ? ?We recommend signing up for the patient portal called "MyChart".  Sign up information is provided on this After Visit Summary.  MyChart is used to connect with patients for Virtual Visits (Telemedicine).  Patients are able to view lab/test results, encounter notes, upcoming appointments, etc.  Non-urgent messages can be sent to your provider as well.   ?To learn more about what you can do with MyChart, go to [email or web address].   ? ?Your next appointment:   ? Keep upcoming appointment  ? ?The format for your next appointment:   ?In Person ? ?Provider:  ?Dr. Ellyn Hack   ?  ? ? ?Other Instructions ?Please follow up with GI doctor & neurology.  ?Recommend Kardia Mobile device and Omron BP cuff. ?Please monitor Blood pressure. If blood pressure is consistently greater than 130/80, please contact our office.   ?

## 2022-01-14 NOTE — Progress Notes (Signed)
Office Visit    Patient Name: Elijah Shaffer Date of Encounter: 01/14/2022  Primary Care Provider:  Lindell Spar, MD Primary Cardiologist:  None  Chief Complaint    41 year old male with the above past medical history including atypical chest pain with normal coronary arteries on cath, PSVT, first-degree AV block, second-degree AV block Mobitz 1 (Wenckebach), chronic dyspnea on exertion, hypertriglyceridemia, hiatal hernia, OSA on CPAP, GERD, eczema, and anxiety who presents for follow-up related to chest pain, palpitations, and dyspnea on exertion.  Past Medical History    Past Medical History:  Diagnosis Date   Acute renal injury ([place]) 06/28/2017   Anxiety    Aspiration pneumonia of right upper lobe due to gastric secretions ([employer])    Diverticulitis    GERD (gastroesophageal reflux disease) 01/08/2018   Seasonal allergies    Syncope 06/26/2021   Past Surgical History:  Procedure Laterality Date   BIOPSY  03/18/2021   Procedure: BIOPSY;  Surgeon: Harvel Quale, MD;  [facility];  Service: Gastroenterology;;  esophageal at Z-line   BIOPSY  05/23/2021   Procedure: BIOPSY;  Surgeon: Harvel Quale, MD;  [facility];  Service: Gastroenterology;;   COLONOSCOPY N/A 09/22/2017   Procedure: COLONOSCOPY;  Surgeon: Rogene Houston, MD;  [facility];  Service: Endoscopy;  Laterality: N/A;  9:55   COLONOSCOPY WITH PROPOFOL N/A 05/23/2021   Procedure: COLONOSCOPY WITH PROPOFOL;  Surgeon: Harvel Quale, MD;  [facility];  Service: Gastroenterology;  Laterality: N/A;  7:30   ESOPHAGOGASTRODUODENOSCOPY (EGD) WITH PROPOFOL N/A 03/18/2021   Procedure: ESOPHAGOGASTRODUODENOSCOPY (EGD) WITH PROPOFOL;  Surgeon: Harvel Quale, MD;  [facility];  Service: Gastroenterology;  Laterality: N/A;   ESOPHAGOGASTRODUODENOSCOPY (EGD) WITH PROPOFOL N/A 05/23/2021   Procedure: ESOPHAGOGASTRODUODENOSCOPY  (EGD) WITH PROPOFOL;  Surgeon: Harvel Quale, MD;  [facility];  Service: Gastroenterology;  Laterality: N/A;   POLYPECTOMY  09/22/2017   Procedure: POLYPECTOMY;  Surgeon: Rogene Houston, MD;  [facility];  Service: Endoscopy;;   RIGHT/LEFT HEART CATH AND CORONARY ANGIOGRAPHY N/A 12/12/2021   Procedure: RIGHT/LEFT HEART CATH AND CORONARY ANGIOGRAPHY;  Surgeon: Jolaine Artist, MD;  Location: [facility];  Service: Cardiovascular;  Laterality: N/A;   TRANSTHORACIC ECHOCARDIOGRAM  02/24/2021   EF 60 to 65%.  Normal LV function.  Normal valves.  Mild RV dilation with normal function.    Allergies  Allergies  Allergen Reactions   Claritin [Loratadine] Other (See Comments)    Heart race   Pollen Extract Other (See Comments)    Seasonal allergies    History of Present Illness    41 year old male with the above past medical history including atypical chest pain with normal coronary arteries on cath, PSVT, first-degree AV block, second-degree AV block Mobitz 1 (Wenckebach), chronic dyspnea on exertion, hypertriglyceridemia, hiatal hernia, OSA on CPAP, GERD, eczema, and anxiety.  He has a history of a syncopal episode in April 2022. Echo at the time showed EF 60 to 65%, normal LV function, mildly enlarged right ventricle.  Carotid Dopplers were negative at the time.  Since the time of his syncopal episode he has had ongoing dyspnea on exertion, generalized weakness and fatigue.  He was referred to Dr. Ellyn Hack in January 2023 by his pulmonologist for profound exertional dyspnea with minimal activity associated with extreme fatigue. Sleep study on 12/12/2021 showed moderate obstructive sleep apnea. Right and left heart catheterization on 12/12/2021 showed normal  coronary arteries, normal hemodynamics, EF 50 to 55%. He did have a brief run of possible SVT prior to cath. Outpatient monitor preliminary results from February 2023 showed predominant underlying rhythm  of sinus rhythm, first-degree AV block present, second-degree AV block Mobitz 1 (Wenckebach), min HR 24, Max HR 159 bpm, average HR 74 bpm.   He called our office on 12/27/2021 with complaints of shortness of breath, pain in the left side of his chest, fatigue and lightheadedness.  He went to urgent care, troponin was negative, D-dimer was normal. He underwent cardiopulmonary exercise test on 01/09/2022 pulmonology. During his test he experienced overall generalized weakness and fatigue, ultimately leading to exercise termination.  There was no cardiopulmonary limitation. Neuromuscular evaluation was recommended for presence of tremor and generalized weakness.  He was advised to follow-up with neurology for valuation of possible neuromuscular disorder.  MRI C-spine was unrevealing, he is scheduled for EMG test.  Additionally, he was evaluated by infectious disease and rheumatology for hyperreflexia, elevated CK, IgM positive antibody concerning for [facility] spotted fever, however, ultimately it was thought the patient did not have rickettsial disease.   He presents today for follow-up accompanied by his brother with a myriad of concerns. Since his last visit continues to have daily palpitations, lightheadedness and shortness of breath with minimal activity.  He feels like he will fall down if he does not sit when the symptoms occur.  He works as a Development worker, international aid and has not been unable to work recently due to his ongoing symptoms. He also reports left sided epigastric pain though he thinks this is likely related to his large hiatal hernia. He continues to have tremors, generalized weakness and fatigue.  He has follow-up scheduled with neurology. He is very frustrated as he has had an extensive work-up to date including pulmonology, GI, rheumatology, infectious disease, cardiology, and neurology, and still does not have an answer as to the cause of his symptoms.   Home Medications    Current Outpatient  Medications  Medication Sig Dispense Refill   aspirin EC 81 MG tablet Take 81 mg by mouth daily. Swallow whole.     b complex vitamins capsule Take 1 capsule by mouth daily. With D3 gummie     betamethasone dipropionate 0.05 % cream Apply 1 application topically 2 (two) times daily as needed (Rash).     cetirizine (ZYRTEC) 10 MG tablet Take 10 mg by mouth daily.     diphenhydrAMINE-zinc acetate (BENADRYL EXTRA STRENGTH) cream Apply 1 application topically 3 (three) times daily as needed for itching. 28.4 g 0   docusate sodium (COLACE) 100 MG capsule Take 100 mg by mouth 2 (two) times daily.     doxycycline (VIBRAMYCIN) 100 MG capsule Take 100 mg by mouth 2 (two) times daily.     FLUoxetine (PROZAC) 20 MG capsule Take 1 capsule (20 mg total) by mouth daily. 90 capsule 1   fluticasone (FLONASE ALLERGY RELIEF) 50 MCG/ACT nasal spray Place 2 sprays into both nostrils daily as needed for allergies or rhinitis. (Patient taking differently: Place 2 sprays into both nostrils daily.) 9.9 mL 2   guaiFENesin (MUCINEX) 600 MG 12 hr tablet Take 600 mg by mouth 2 (two) times daily as needed for to loosen phlegm.     hydrOXYzine (ATARAX/VISTARIL) 10 MG tablet Take 1 tablet (10 mg total) by mouth 3 (three) times daily as needed for anxiety. 30 tablet 0   meclizine (ANTIVERT) 12.5 MG tablet Take 12.5 mg by mouth daily as needed  for dizziness.     montelukast (SINGULAIR) 10 MG tablet Take 1 tablet (10 mg total) by mouth at bedtime. 30 tablet 11   omega-3 acid ethyl esters (LOVAZA) 1 g capsule Take 2 capsules (2 g total) by mouth 2 (two) times daily. 120 capsule 3   ondansetron (ZOFRAN) 4 MG tablet Take 1 tablet (4 mg total) by mouth every 6 (six) hours as needed for nausea. 20 tablet 0   pantoprazole (PROTONIX) 40 MG tablet Take 1 tablet (40 mg total) by mouth 2 (two) times daily. 180 tablet 0   Potassium 99 MG TABS Take 1 tablet by mouth daily.     triamcinolone cream (KENALOG) 0.1 % Apply 1 application topically  2 (two) times daily. 30 g 0   acetaminophen (TYLENOL) 500 MG tablet Take 500-1,000 mg by mouth every 6 (six) hours as needed for moderate pain or mild pain. (Patient not taking: Reported on 01/14/2022)     albuterol (VENTOLIN HFA) 108 (90 Base) MCG/ACT inhaler Inhale 2 puffs into the lungs every 6 (six) hours as needed for shortness of breath. (Patient not taking: Reported on 01/14/2022) 8 g 0   fluticasone-salmeterol (ADVAIR) 250-50 MCG/ACT AEPB SMARTSIG:1 Puff(s) By Mouth Morning-Night     propranolol (INDERAL) 20 MG tablet Take one tablet twice daily as needed for palpitations 180 tablet 3   No current facility-administered medications for this visit.     Review of Systems    He denies chest pain, pnd, orthopnea, n, v, syncope, edema, weight gain, or early satiety. All other systems reviewed and are otherwise negative except as noted above.   Physical Exam    VS:  BP 132/90 (BP Location: Left Arm, Patient Position: Sitting, Cuff Size: Normal)    Pulse (!) 109    Ht _0  (1.905 m)    Wt 250 lb 6.4 oz (113.6 kg)    SpO2 98%    BMI 31.30 kg/m   GEN: Well nourished, well developed, in no acute distress. HEENT: normal. Neck: Supple, no JVD, carotid bruits, or masses. Cardiac: RRR, no murmurs, rubs, or gallops. No clubbing, cyanosis, edema.  Radials/DP/PT 2+ and equal bilaterally.  Respiratory:  Respirations regular and unlabored, clear to auscultation bilaterally. GI: Soft, nontender, nondistended, BS + x 4. MS: no deformity or atrophy. Skin: warm and dry, no rash. Neuro:  Strength and sensation are intact. Psych: Normal affect.  Accessory Clinical Findings    ECG personally reviewed by me today - NSR with sinus arrhythmia, 76 bpm - no acute changes.  Lab Results  Component Value Date   WBC 8.2 12/27/2021   HGB 16.7 12/27/2021   HCT 49.8 12/27/2021   MCV 87.7 12/27/2021   PLT 257 12/27/2021   Lab Results  Component Value Date   CREATININE 1.31 (H) 12/27/2021   BUN 19 12/27/2021    NA 134 (L) 12/27/2021   K 3.8 12/27/2021   CL 104 12/27/2021   CO2 20 (L) 12/27/2021   Lab Results  Component Value Date   ALT 75 (H) 11/22/2021   AST 45 (H) 11/22/2021   ALKPHOS 81 11/22/2021   BILITOT 0.5 11/22/2021   Lab Results  Component Value Date   CHOL 122 11/22/2021   HDL 31 (L) 11/22/2021   LDLCALC 25 11/22/2021   TRIG 481 (H) 11/22/2021   CHOLHDL 3.9 11/22/2021    Lab Results  Component Value Date   HGBA1C 5.4 02/24/2021    Assessment & Plan    1. Palpitations/lightheadedness/H/o PSVT/first-degree AV  block present/second-degree AV block Mobitz 1 (Wenckebach): H/o brief run of possible SVT prior to cath. Preliminary monitor report shows Wenckebach that occurred at 3 AM, no pauses greater than 3 seconds. He continues to report daily palpitations, lightheadedness, and shortness of breath.  Orthostatics negative in office today. Recommend KardiaMobile device for continued home monitoring given symptomatic palpitations. Will add propranolol 20 mg twice daily as needed for palpitations. Otherwise, will avoid additional AV nodal blocking agents. Will route to primary cardiologist (Dr. Ellyn Hack) for further input given extensive work-up to date.   2. Atypical chest pain: Right and left heart catheterization on 12/12/2021 showed normal coronary arteries, normal hemodynamics, EF 50 to 55%. Cardiopulmonary exercise test in March 2023 showed no evidence of cardiopulmonary limitation.  He continues to experience left-sided epigastric discomfort. Prior ischemic evaluation reassuring with normal R/LHC.  Prior GI work-up revealed large hiatal hernia. Recommend follow-up with PCP/GI as hernia could be contributing to his discomfort.  3. Chronic dyspnea on exertion/Suspected pulmonary hypertension/OSA: Echo in April 2022 showed EF 60-65%, normal LV function, mildly enlarged right ventricle. Recent R/LHC and cardiopulmonary exercise test as above. He continues to have dyspnea with minimal  exertion.  Prior pulmonary work-up essentially unremarkable, high-resolution CT chest was negative, PFT showed mild diffusion defect.  Recently diagnosed with sleep apnea. Encouraged adherence to CPAP.  Etiology remains unclear at this time. Follow-up with pulmonology as planned.   4. Tremors/generalized weakness/hyperreflexia: Ongoing since February 2023. Recommend follow-up with neurology and infectious disease for further consideration of neurological versus infectious etiology.  5. Hypertension: BP mildly elevated in office today. He is not on any antihypertensive medication at this time.  Recommend home BP monitoring with automatic arm cuff. I advised him to report BP consistently greater than 130/80. If BP remains elevated, consider addition antihypertensive medication.  6. Hypertriglyceridemia: Triglycerides were elevated at 838 in September 2022.  He was started on Lovaza.  Repeat triglycerides on 11/22/2021 resulted 481.  Monitored and managed by PCP. Continue Lovaza.   7. Disposition: Follow-up with Dr. Ellyn Hack as scheduled in May 2023, sooner if needed.   Lenna Sciara, NP 01/14/2022, 3:54 PM

## 2022-01-15 ENCOUNTER — Telehealth: Payer: Self-pay | Admitting: Nurse Practitioner

## 2022-01-15 NOTE — Telephone Encounter (Signed)
Patient is calling requesting to speak with Raquel Sarna in regards to the things discussed at his appt.  ?

## 2022-01-15 NOTE — Telephone Encounter (Signed)
Spoke to patient he stated he wanted to talk to [facility] PA.He has several questions.Advised she is in clinic.I will send message to her. ?

## 2022-01-16 ENCOUNTER — Other Ambulatory Visit: Payer: Self-pay | Admitting: *Deleted

## 2022-01-16 ENCOUNTER — Telehealth: Payer: Self-pay

## 2022-01-16 ENCOUNTER — Telehealth: Payer: Self-pay | Admitting: Pulmonary Disease

## 2022-01-16 DIAGNOSIS — A7740 Ehrlichiosis, unspecified: Secondary | ICD-10-CM

## 2022-01-16 DIAGNOSIS — R42 Dizziness and giddiness: Secondary | ICD-10-CM

## 2022-01-16 NOTE — Telephone Encounter (Signed)
I left a message to call back for this patient.  ?

## 2022-01-16 NOTE — Telephone Encounter (Signed)
Pt notified with verbal understanding  °

## 2022-01-16 NOTE — Telephone Encounter (Signed)
Patient is asking for  ID referral to [facility] - Laveda Norman MD - [phone] / He is also asking for a ENT referral in [place].  Thanks ?

## 2022-01-16 NOTE — Telephone Encounter (Signed)
Called and spoke with patient regarding recommendations per Dr. Erin Fulling.  He states he has been wearing the CPAP all night until he has to get up to go to the bathroom for about 6 days and is still very fatigued and cannot work.  I advised him that he needs to give it more time for the CPAP machine to work.  He says he knows he needs to get up to go to the bathroom at 2 am and he just lays there because he is so fatigued.  He gets to the bathroom and his arm shakes.  He is still staying tired and it is interfering with him trying to work.  He said he had his stress test done, however the results were not discussed with him.  I advised him that there is a note in the chart that Dr. Erin Fulling discussed the results with him.  The patient said he thought the appointment on 3/14 was for him to discuss his test results with him and he wants to keep the appointment.  He has his neurological testing next week.  I advised him that I would send a message to Dr. Erin Fulling and then we would call him back.  He verbalized understanding. ? ?Dr. Erin Fulling, ?Please advise.  Thank you. ?

## 2022-01-16 NOTE — Telephone Encounter (Signed)
Patient has been made aware. He is not having palpitations. His main complaint was fatigue. Patient made aware of ED precautions should new or worsening symptoms occur. Patient verbalized understanding.  ? ? ?Lenna Sciara, NP  You 24 minutes ago (3:25 PM)  ? ?I have routed my note from our most recent office visit to Dr. Ellyn Hack and am awaiting a response. At our visit, I recommended a KardiaMobile device for continued monitoring of heart rate/rhythm. I would recommend this still. Did he take his propanolol as prescribed at last visit when his symptoms occurred? If he has symptoms, recommend he take propanolol and check vital signs (BP/HR). If he takes propanolol and does not have relief of symptoms (presyncope, syncope, severe dyspnea, chest pain), recommend he go to ED for further evaluation. Otherwise, we will await recommendations from Dr. Ellyn Hack. Thank you.  ? ?

## 2022-01-16 NOTE — Telephone Encounter (Signed)
Called and spoke with patient, advised of recommendations/results per Dr. Erin Fulling.  He verbalized understanding.  Nothing further needed. ?

## 2022-01-16 NOTE — Telephone Encounter (Signed)
I called the patient and he reports that he is feeling tired and having some heart racing this morning after taking off his machine. He was hooking up the trailer for a friend this morning and went to [facility] it and he reports that he just felt off.  He denies any dizziness, headaches, but he does feel some numbness at times going down his arm. He has seen cardiology recently and was told that he was doing well with his blood pressure.  ? ?He has been using his CPAP and he felt off this morning with his heart racing. He feels short of breath when walking to the exercise test the other day. He wants to feel better and he was told the he has a nerve conduction test on 01/24/22. He has a follow up on 01/22/22  ?

## 2022-01-16 NOTE — Telephone Encounter (Signed)
ATC patient x1, left detailed message per DPR and phone # to return call. ?

## 2022-01-16 NOTE — Telephone Encounter (Signed)
Returned the call to the patient. He was calling to speak with Raquel Sarna. He stated that last night when he went to the restroom, he lost all of his energy to go back to bed. He stated that he felt drained. He was calling to give Raquel Sarna an update and to see if Dr. Ellyn Hack has any recommendations moving forward.  ? ? ? ? ?

## 2022-01-16 NOTE — Telephone Encounter (Signed)
Patient does not need appointment with me on 3/14. Please schedule him for appointment in April as planned to review CPAP compliance report. CPAP takes weeks/months sometimes to notice results. ? ?CPET results were discussed with patient based on prior call to the office. They did not see any issues with his heart or lung based on exercise test.  ? ?We have nothing further to add at this time. He needs to complete Neurologic evaluation for possible neuromuscular disorder. ? ?JD ?

## 2022-01-16 NOTE — Telephone Encounter (Signed)
Called and spoke with patient. He verbalized understanding of his test results. I have cancelled the appt for 3/14 and he is aware.  ? ?Nothing further needed at time of call.  ?

## 2022-01-16 NOTE — Telephone Encounter (Signed)
New Message: ? ? ? ? ? ?Patient says he really like to talk to [facility] please. He said he had had an episode lat night, heart beating real fat, so weak could hardly get out of bed to go to the bathroom. ? ? ?Patient c/o Palpitations:  High priority if patient c/o lightheadedness, shortness of breath, or chest pain ? ?How long have you had palpitations/irregular HR/ Afib? Are you having the symptoms now? Heart beating real hard ? ?Are you currently experiencing lightheadedness, SOB or CP?  little  short of breath and weak ? ?Do you have a history of afib (atrial fibrillation) or irregular heart rhythm?  ? ?Have you checked your BP or HR? (document readings if available): have not checked it ? ?Are you experiencing any other symptoms?  R eal short of breath at times and  really fatigued ? ?

## 2022-01-16 NOTE — Telephone Encounter (Signed)
Patient does not need to follow up on 3/14 as there is nothing I will do differently at this time for him. He can follow up in April as planned after last visit to review his CPAP compliance reports, this will give him more time to get used to the CPAP device.  ? ?He needs to complete the testing with the neurology team. ? ?Thanks, ?JD ?

## 2022-01-17 ENCOUNTER — Ambulatory Visit: Payer: Self-pay | Admitting: Internal Medicine

## 2022-01-19 ENCOUNTER — Encounter: Payer: Self-pay | Admitting: Pulmonary Disease

## 2022-01-21 DIAGNOSIS — R519 Headache, unspecified: Secondary | ICD-10-CM | POA: Diagnosis not present

## 2022-01-21 DIAGNOSIS — R030 Elevated blood-pressure reading, without diagnosis of hypertension: Secondary | ICD-10-CM | POA: Diagnosis not present

## 2022-01-21 DIAGNOSIS — R2689 Other abnormalities of gait and mobility: Secondary | ICD-10-CM | POA: Diagnosis not present

## 2022-01-21 DIAGNOSIS — R42 Dizziness and giddiness: Secondary | ICD-10-CM | POA: Diagnosis not present

## 2022-01-22 ENCOUNTER — Ambulatory Visit: Payer: 59 | Admitting: Pulmonary Disease

## 2022-01-23 ENCOUNTER — Ambulatory Visit: Payer: 59 | Admitting: Internal Medicine

## 2022-01-24 ENCOUNTER — Other Ambulatory Visit: Payer: Self-pay

## 2022-01-24 ENCOUNTER — Ambulatory Visit: Payer: 59 | Admitting: Neurology

## 2022-01-24 DIAGNOSIS — R292 Abnormal reflex: Secondary | ICD-10-CM

## 2022-01-24 DIAGNOSIS — G610 Guillain-Barre syndrome: Secondary | ICD-10-CM

## 2022-01-25 NOTE — Procedures (Signed)
[facility]  ?[address] ? [place], [place] [address] ?Tel: [phone] ?Fax:  [phone] ?Test Date:  01/24/2022 ? ?Patient: Elijah Shaffer DOB: [birth year] Physician: Narda Amber, DO  ?Sex: Male Height: '6\' 3"'$  Ref Phys: Metta Clines, D.O.  ?ID#: [id number]   Technician:   ? ?Patient Complaints: ?This is a 41 year old man referred for evaluation of generalized myalgias. ? ?NCV & EMG Findings: ?Extensive electrodiagnostic testing of the right upper and lower extremity shows:  ?Right median sensory response shows prolonged latency (3.6 ms).  Right ulnar sensory response shows prolonged latency (3.8 ms) and reduced amplitude (9.4 ?V).  Right radial sensory response shows reduced amplitude (R14..9 ?V).  Right superficial peroneal sensory response is absent.  Right sural sensory response is within normal limits. ?Right median and peroneal motor responses are within normal limits.  Right ulnar motor response shows prolonged latency (3.9 ms) and decreased conduction velocity (A Elbow-B Elbow, 40 m/s).  Right tibial motor response shows reduced amplitude (6.8 mV). ?Right ulnar and tibial F-wave studies shows prolonged latencies.  Right tibial H reflex study is within normal limits. ?In the right upper extremity, chronic motor axonal loss changes are seen affecting the ulnar innervated muscles, without accompanied active denervation. ?In the right lower extremity, there is no evidence of active or chronic motor axonal loss changes affecting any of the tested muscles. ? 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 ? ? ?___________________________ ?Narda Amber, DO ? ? ? ?Nerve Conduction Studies ?Anti Sensory Summary Table ? ? Stim Site NR Peak (ms) Norm Peak (ms) P-T Amp (?V) Norm P-T Amp  ?Right Median Anti Sensory (2nd Digit)   32?C  ?Wrist    3.6 <3.4 25.1 >20  ?Right Radial Anti Sensory (Base 1st Digit)  32?C  ?Wrist    1.9 <2.7 14.9 >18  ?Right Sup Peroneal Anti Sensory (Ant Lat Mall)  32?C  ?12 cm NR  <4.5  >5  ?Right Sural Anti Sensory (Lat Mall)  32?C  ?Calf    3.8 <4.5 8.1 >5  ?Right Ulnar Anti Sensory (5th Digit)  32?C  ?Wrist    3.8 <3.1 9.4 >12  ? ?Motor Summary Table ? ? Stim Site NR Onset (ms) Norm Onset (ms) O-P Amp (mV) Norm O-P Amp Site1 Site2 Delta-0 (ms) Dist (cm) Vel (m/s) Norm Vel (m/s)  ?Right Median Motor (Abd Poll Brev)  32?C  ?Wrist    3.1 <3.9 14.1 >6 Elbow Wrist 6.0 32.0 53 >50  ?Elbow    9.1  13.8         ?Right Peroneal Motor (Ext Dig Brev)  32?C  ?Ankle    3.3 <5.5 6.9 >3 B Fib Ankle 10.3 45.0 44 >40  ?B Fib    13.6  6.0  Poplt B Fib 1.9 10.0 53 >40  ?Poplt    15.5  5.9         ?Right Tibial Motor (Abd [place] Crane Brev)  32?C  ?Ankle    4.3 <6.0 6.8 >8 Knee Ankle 9.8 49.0 50 >40  ?Knee    14.1  6.3         ?Right Ulnar Motor (Abd Dig Minimi)  32?C  ?Wrist    3.9 <3.1 10.0 >7 B Elbow Wrist 4.5 26.0 58 >50  ?B Elbow    8.4  9.6  A Elbow B Elbow 2.5 10.0 40 >50  ?A Elbow  10.9  9.3         ? ?F Wave Studies ? ? NR F-Lat (ms) Lat Norm (ms) L-R F-Lat (ms)  ?Right Tibial (Mrkrs) (Abd Hallucis)  32?C  ?   63.41 <55   ?Right Ulnar (Mrkrs) (Abd Dig Min)  32?C  ?   36.76 <33   ? ?H Reflex Studies ? ? NR H-Lat (ms) Lat Norm (ms) L-R H-Lat (ms)  ?Right Tibial (Gastroc)  32?C  ?   34.97 <35   ? ?EMG ? ? Side Muscle Ins Act Fibs Psw Fasc Number Recrt Dur Dur. Amp Amp. Poly Poly. Comment  ?Right AntTibialis Nml Nml Nml Nml Nml Nml Nml Nml Nml Nml Nml Nml N/A  ?Right Gastroc Nml Nml Nml Nml Nml Nml Nml Nml Nml Nml Nml Nml N/A  ?Right Flex Dig Long Nml Nml Nml Nml Nml Nml Nml Nml Nml Nml Nml Nml N/A  ?Right RectFemoris Nml Nml Nml Nml Nml Nml Nml Nml Nml Nml Nml Nml N/A  ?Right GluteusMed Nml Nml Nml Nml Nml Nml Nml Nml Nml Nml Nml Nml N/A  ?Right Lumbo Parasp Low Nml Nml Nml Nml Nml Nml Nml Nml Nml Nml Nml Nml N/A  ?Right 1stDorInt  Nml Nml Nml Nml 1- Rapid Few 1+ Few 1+ Few 1+ N/A  ?Right Abd Poll Brev Nml Nml Nml Nml Nml Nml Nml Nml Nml Nml Nml Nml N/A  ?Right Ext Indicis Nml Nml Nml Nml Nml Nml Nml Nml Nml Nml Nml Nml N/A  ?Right PronatorTeres Nml Nml Nml Nml Nml Nml Nml Nml Nml Nml Nml Nml N/A  ?Right Biceps Nml Nml Nml Nml Nml Nml Nml Nml Nml Nml Nml Nml N/A  ?Right Triceps Nml Nml Nml Nml Nml Nml Nml Nml Nml Nml Nml Nml N/A  ?Right Deltoid Nml Nml Nml Nml Nml Nml Nml Nml Nml Nml Nml Nml N/A  ?Right Cervical Parasp Low Nml Nml Nml Nml Nml Nml Nml Nml Nml Nml Nml Nml N/A  ?Right ABD Dig Min Nml Nml Nml Nml 2- Rapid Few 1+ Few 1+ Few 1+ N/A  ?Right FlexCarpiUln Nml Nml Nml Nml Nml Nml Nml Nml Nml Nml Nml Nml N/A  ? ? ? ? ?Waveforms: ?    ? ?    ? ?    ? ?    ? ? ?

## 2022-01-29 ENCOUNTER — Other Ambulatory Visit: Payer: Self-pay | Admitting: *Deleted

## 2022-01-29 ENCOUNTER — Telehealth: Payer: Self-pay

## 2022-01-29 ENCOUNTER — Ambulatory Visit: Payer: 59 | Admitting: Internal Medicine

## 2022-01-29 DIAGNOSIS — N5089 Other specified disorders of the male genital organs: Secondary | ICD-10-CM

## 2022-01-29 NOTE — Telephone Encounter (Signed)
I have talked with Elijah Shaffer and he is all set with his referrals.  Does not need anything else at this point.  Will call if he needs anything.  ?

## 2022-01-29 NOTE — Telephone Encounter (Signed)
Called the pt per his request via [place]. No answer at this time, left message for him to return the call.  ?

## 2022-01-30 ENCOUNTER — Telehealth: Payer: Self-pay | Admitting: Neurology

## 2022-01-30 ENCOUNTER — Telehealth: Payer: Self-pay | Admitting: Cardiology

## 2022-01-30 ENCOUNTER — Encounter: Payer: Self-pay | Admitting: Urology

## 2022-01-30 ENCOUNTER — Telehealth: Payer: Self-pay | Admitting: Pulmonary Disease

## 2022-01-30 DIAGNOSIS — R42 Dizziness and giddiness: Secondary | ICD-10-CM | POA: Insufficient documentation

## 2022-01-30 DIAGNOSIS — R0981 Nasal congestion: Secondary | ICD-10-CM | POA: Insufficient documentation

## 2022-01-30 DIAGNOSIS — S233XXA Sprain of ligaments of thoracic spine, initial encounter: Secondary | ICD-10-CM | POA: Diagnosis not present

## 2022-01-30 DIAGNOSIS — J309 Allergic rhinitis, unspecified: Secondary | ICD-10-CM | POA: Diagnosis not present

## 2022-01-30 DIAGNOSIS — M4004 Postural kyphosis, thoracic region: Secondary | ICD-10-CM | POA: Diagnosis not present

## 2022-01-30 DIAGNOSIS — M7061 Trochanteric bursitis, right hip: Secondary | ICD-10-CM | POA: Diagnosis not present

## 2022-01-30 DIAGNOSIS — R55 Syncope and collapse: Secondary | ICD-10-CM | POA: Insufficient documentation

## 2022-01-30 DIAGNOSIS — S134XXA Sprain of ligaments of cervical spine, initial encounter: Secondary | ICD-10-CM | POA: Diagnosis not present

## 2022-01-30 DIAGNOSIS — S338XXA Sprain of other parts of lumbar spine and pelvis, initial encounter: Secondary | ICD-10-CM | POA: Diagnosis not present

## 2022-01-30 NOTE — Telephone Encounter (Signed)
See previous encounter

## 2022-01-30 NOTE — Telephone Encounter (Signed)
Called patient back and left a message for a call back.  ?

## 2022-01-30 NOTE — Telephone Encounter (Signed)
Pt called in and left a message returning our call ?

## 2022-01-30 NOTE — Telephone Encounter (Signed)
Called and spoke with patient. He stated that he is still having the tremors and increased SOB episodes. He hasn't been able to work for the past few weeks but felt well enough to work on 01/24/22. 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 He was trying to load his equipment onto his truck and the tremors started again. He felt like he was going to faint but never did. He denies ever having these episodes while driving.  ? ?He did have the nerve conduction test last week but he will not receive the results until the first week of April.  ? ?He wanted to know if Dr. Erin Fulling had any recommendations for him. He has been in contact with his PCP, neurologist and cardiologist and no one seems to know what is going on with him.  ? ?JD, can you please advise? Thanks!  ?

## 2022-01-30 NOTE — Telephone Encounter (Signed)
Called patient and he has informed me that he has been having fatigue and shortness of breath for 2 years. 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 He also states that the last 2 months he has developed tremors. He states his arms shake when grabbing stuff, both legs shake, and his head and neck shake. He states that it happens very frequently. He feels like there is something going on and he has been getting worse.  ? ?Patient is aware that I will send this message to Dr. Tomi Likens and give him a call once I hear back.  ?

## 2022-01-30 NOTE — Telephone Encounter (Signed)
Well - I saw him to set up Cash -- we did LHC as well - both were pretty normal ? ?Monitor showed SR - Stachy but no arrhythmias. ? ?Recommend cutting back caffeine  -- b/c that will increase  HR & BP ?Also - use the PRN Propranolol for HR fast & high BP. ? ?- probably best to see PCP. ? ?Glenetta Hew, MD ? ?

## 2022-01-30 NOTE — Telephone Encounter (Signed)
Pt c/o Shortness Of Breath: STAT if SOB developed within the last 24 hours or pt is noticeably SOB on the phone ? ?1. Are you currently SOB (can you hear that pt is SOB on the phone)? no ? ?2. How long have you been experiencing SOB? It's been going on since last year ? ?3. Are you SOB when sitting or when up moving around? Up moving around ? ?4. Are you currently experiencing any other symptoms? When he is working, he gets tired and fatigued. States he has tremors all over his body.  States his heart is still racing too, but doesn't keep a log and didn't know his heart rate.  ?

## 2022-01-30 NOTE — Telephone Encounter (Signed)
Called patient and left a message for a call back.  

## 2022-01-30 NOTE — Telephone Encounter (Signed)
Left message with the access nurse.  He states went to [employer] and was walking and gets out of breath.  Has a lot of shortness of breath and fatigue.  He would like advice, doesn't know whats going on. ?

## 2022-01-30 NOTE — Telephone Encounter (Signed)
Spoke with patient of Dr. Ellyn Hack  ?He reports SOB for a year ?He was seen on 01/14/22 by Raquel Sarna NP - he said no one can figure out what is going on with him  ?Tremors, shaking all over body - this is new -- no concerning symptoms for TIA/CVA reported  ?He reports he is unable to work b/c of these issues ?He reports drinking 2L of a caffeinated beverage daily - advised to decrease intake of this ?He reports heart racing with minimal activity -- has not taken PRN propranolol that was prescribed 3/6 ?He reports his BP was high yesterday and today (170/90) ? ?He was recently started on CPAP but still wakes up tired and fatigued during the day - follows w/pulm for this ? 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 ?Of note, he has also reached out to pulm and neuro regarding similar concerns  ? ?Advised will send to MD/NP to review ?

## 2022-01-31 ENCOUNTER — Telehealth: Payer: Self-pay | Admitting: Neurology

## 2022-01-31 ENCOUNTER — Telehealth: Payer: Self-pay

## 2022-01-31 DIAGNOSIS — R002 Palpitations: Secondary | ICD-10-CM | POA: Diagnosis not present

## 2022-01-31 DIAGNOSIS — R079 Chest pain, unspecified: Secondary | ICD-10-CM | POA: Diagnosis not present

## 2022-01-31 DIAGNOSIS — E785 Hyperlipidemia, unspecified: Secondary | ICD-10-CM | POA: Diagnosis not present

## 2022-01-31 DIAGNOSIS — R9431 Abnormal electrocardiogram [ECG] [EKG]: Secondary | ICD-10-CM | POA: Diagnosis not present

## 2022-01-31 DIAGNOSIS — R569 Unspecified convulsions: Secondary | ICD-10-CM | POA: Diagnosis not present

## 2022-01-31 DIAGNOSIS — K219 Gastro-esophageal reflux disease without esophagitis: Secondary | ICD-10-CM | POA: Diagnosis not present

## 2022-01-31 DIAGNOSIS — Z6831 Body mass index (BMI) 31.0-31.9, adult: Secondary | ICD-10-CM | POA: Diagnosis not present

## 2022-01-31 DIAGNOSIS — R251 Tremor, unspecified: Secondary | ICD-10-CM | POA: Diagnosis not present

## 2022-01-31 NOTE — Telephone Encounter (Signed)
Patient has been contacted. Please see previous encounter.  ?

## 2022-01-31 NOTE — Telephone Encounter (Signed)
Spoke with pt he stated yesterday head started shaking really bad like it was going to shake off and then head started hurting today this happened this am advised pt he would need to go to ER to be evaluated since this has happened twice and there were no available appointments here. Pt stated he called ER and they said if they didn't find anything they wasn't going to tell him anything. Advised pt that until he is evaluated in ER in person no one call tell him anything and it was best for him to go to ER and be evaluated with verbal understanding  ?

## 2022-01-31 NOTE — Telephone Encounter (Signed)
I do not have any further recommendations at this time.  ? ?We will review his CPAP download at his upcoming follow up visit. I read the cardiology phone note where he is drinking 2L of caffeinated beverages per day. I would like him to keep a daily journal of what he is eating and drinking on a daily basis as well as a blood pressure log, morning and afternoon blood pressures. He should also keep track of any over the counter medications or supplements he is taking.  ? ?Thanks, ?JD ?

## 2022-01-31 NOTE — Telephone Encounter (Signed)
Pt called in stating he would like to find out how to go about getting a spinal tap done ?

## 2022-01-31 NOTE — Telephone Encounter (Signed)
Pt called and said he needs to talk with someone.  He said he is shaking and his head is hurting and he does not know if he just needs to go to [facility]. Would like to talk with Nurse or MD.   ?

## 2022-01-31 NOTE — Telephone Encounter (Signed)
Pt called in stating he spoke with someone yesterday that was going to speak with Dr. Tomi Likens about him shaking. He says it's so bad he is afraid to drive and he is depressed. ?

## 2022-01-31 NOTE — Telephone Encounter (Signed)
Called patient and informed him of what Dr. Tomi Likens has said that he does not have a neurologic explanation for his symptoms.  The nerve study showed evidence of neuropathy which is something I want to workup with a spinal tap.  Patient stated he has not arranged that and is aware Dr. Tomi Likens was wanting him to get that done. ? ?Also, informed patient that Dr. Tomi Likens does not see how neuropathy would play into his other symptoms which he is not sure is neurologic in etiology. Informed patient that unfortunately Dr. Tomi Likens does not have any answers or recommendations for him. ? ?Patient stated that his mom is going to take him to the hospital in [place] today to see what is what is going on and get checked out.  ?

## 2022-02-01 ENCOUNTER — Telehealth: Payer: Self-pay

## 2022-02-01 ENCOUNTER — Other Ambulatory Visit: Payer: Self-pay

## 2022-02-01 DIAGNOSIS — R2681 Unsteadiness on feet: Secondary | ICD-10-CM

## 2022-02-01 DIAGNOSIS — R292 Abnormal reflex: Secondary | ICD-10-CM

## 2022-02-01 DIAGNOSIS — M791 Myalgia, unspecified site: Secondary | ICD-10-CM

## 2022-02-01 DIAGNOSIS — G610 Guillain-Barre syndrome: Secondary | ICD-10-CM

## 2022-02-01 NOTE — Telephone Encounter (Signed)
Spoke with patient of Dr. Ellyn Hack and relayed message from MD as noted below.  ? ?Spent about 15 minutes on the phone with patient as he expressed concerns about his condition, tremors, inability to work, and that no one can figure out what is wrong with him. He said he has to have a lumbar puncture/spinal tap per neuro, has contact rheumatologist and infectious disease (per chart review) and has many follow ups scheduled. Reiterated that from a heart standpoint, things are pretty normal but should decrease caffeine and use PRN propranolol.  ?

## 2022-02-01 NOTE — Telephone Encounter (Signed)
I spoke with patient, he had been at hospital. Sent home, I advised that to follow instructions that were given.Patient agreed to do LP, orders sent to [facility]. They will contact patient to have test performed. ?

## 2022-02-01 NOTE — Telephone Encounter (Signed)
Mother took patient to [facility] yesterday to ER was not admitted. ?Patient says shaking is getting worse, laying on the bed shaky very bad. Dr from [facility] took him out work, no driving. Please contact patient what is the next step to take.  Needs to know what is going on.  ?

## 2022-02-01 NOTE — Telephone Encounter (Signed)
Called and spoke with patient. He states that the tremors started in his arms and legs but now it has moved to his head and neck. Patient states that it's gotten worse over the past 2 days and the hospital has told him he can't work or drive anymore right now. ? ?Dr. Erin Fulling, please advise.  ?

## 2022-02-01 NOTE — Telephone Encounter (Signed)
Called and spoke with patient. He was very upset and hung up the phone. Nothing further needed.  ?

## 2022-02-01 NOTE — Telephone Encounter (Signed)
Patient called stating that he went to [facility] ED yesterday due to having "shakes in arms and legs", fatigue and SOB. Patient states that his labs came back abnormal. (See care everywhere). Patient also has appointment with [facility] infectious disease on 4/10.  ?

## 2022-02-01 NOTE — Telephone Encounter (Signed)
FYI, will route to [facility] on Monday.Was noted already in chart  ?

## 2022-02-01 NOTE — Telephone Encounter (Signed)
Patient called stating he was experiencing "seizure like tremors" and went to the ER at [facility] last night to be evaluated.  Patient states they didn't find anything and was told he shouldn't drive or go to work.  Patient states the shaking is now in both arms, legs, neck and head.  Patient states he has seen numerous doctors and had labs, but has not been given any answers.  Patient requested a return call.   ?

## 2022-02-01 NOTE — Telephone Encounter (Signed)
He should be discussing these symptoms with his neurology team. ? ?Thanks, ?JD ? ?

## 2022-02-01 NOTE — Telephone Encounter (Signed)
I tried to contact patient. I have ordered LP, to fax once I receive a call back to get it scheduled.  ?

## 2022-02-01 NOTE — Telephone Encounter (Signed)
Pt called in wanting to make sure Dr. Tomi Likens is aware of him going to the hospital at [facility] yesterday. He wants to figure out what is going on with him. He thinks something is attacking his system. ?

## 2022-02-01 NOTE — Telephone Encounter (Signed)
Saw still shaking getting worse all over.  Just in his hands and has progressed to all over his  ?

## 2022-02-01 NOTE — Telephone Encounter (Signed)
Patient called to report he is still not feeling well. ? ? ?

## 2022-02-01 NOTE — Telephone Encounter (Signed)
Spoke with pt advised of Dr Serita Grit message, pt states that he has spoke with neurologists yesterday and they are saying the only thing that's left to do is a spinal tap. PT is concerned that the provider took him out of work and that he is in debt already. He states that told him that they couldn't give him information on disability. Please advise.  ?

## 2022-02-03 NOTE — Telephone Encounter (Signed)
His rheumatology work-up was negative.  He was referred to Dr. Tomi Likens and is undergoing work-up.  He has an appointment coming up with Dr. Tomi Likens on April 4.  He should contact Dr. Tomi Likens.

## 2022-02-04 DIAGNOSIS — R55 Syncope and collapse: Secondary | ICD-10-CM | POA: Diagnosis not present

## 2022-02-04 DIAGNOSIS — R42 Dizziness and giddiness: Secondary | ICD-10-CM | POA: Diagnosis not present

## 2022-02-04 DIAGNOSIS — R251 Tremor, unspecified: Secondary | ICD-10-CM | POA: Diagnosis not present

## 2022-02-04 DIAGNOSIS — R2689 Other abnormalities of gait and mobility: Secondary | ICD-10-CM | POA: Diagnosis not present

## 2022-02-04 DIAGNOSIS — R519 Headache, unspecified: Secondary | ICD-10-CM | POA: Diagnosis not present

## 2022-02-04 NOTE — Telephone Encounter (Signed)
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

## 2022-02-04 NOTE — Telephone Encounter (Signed)
Patient advised his rheumatology work-up was negative.  He was referred to Dr. Tomi Likens and is undergoing work-up.  He has an appointment coming up with Dr. Tomi Likens on April 4.  He should contact Dr. Tomi Likens. ?

## 2022-02-06 ENCOUNTER — Other Ambulatory Visit: Payer: Self-pay

## 2022-02-06 ENCOUNTER — Other Ambulatory Visit (HOSPITAL_COMMUNITY)
Admission: RE | Admit: 2022-02-06 | Discharge: 2022-02-06 | Disposition: A | Discharge status: Home / Self Care | Payer: 59 | Source: Ambulatory Visit | Attending: Neurology | Admitting: Neurology

## 2022-02-06 ENCOUNTER — Ambulatory Visit
Admission: RE | Admit: 2022-02-06 | Discharge: 2022-02-06 | Disposition: A | Discharge status: Home / Self Care | Payer: 59 | Source: Ambulatory Visit | Attending: Neurology | Admitting: Neurology

## 2022-02-06 VITALS — BP 133/86 | HR 71

## 2022-02-06 DIAGNOSIS — R836 Abnormal cytological findings in cerebrospinal fluid: Secondary | ICD-10-CM | POA: Diagnosis not present

## 2022-02-06 DIAGNOSIS — R2681 Unsteadiness on feet: Secondary | ICD-10-CM

## 2022-02-06 DIAGNOSIS — M791 Myalgia, unspecified site: Secondary | ICD-10-CM

## 2022-02-06 DIAGNOSIS — G610 Guillain-Barre syndrome: Secondary | ICD-10-CM | POA: Diagnosis not present

## 2022-02-06 DIAGNOSIS — R292 Abnormal reflex: Secondary | ICD-10-CM | POA: Insufficient documentation

## 2022-02-06 DIAGNOSIS — G629 Polyneuropathy, unspecified: Secondary | ICD-10-CM | POA: Diagnosis not present

## 2022-02-06 DIAGNOSIS — R269 Unspecified abnormalities of gait and mobility: Secondary | ICD-10-CM | POA: Diagnosis not present

## 2022-02-06 NOTE — Progress Notes (Signed)
1 vial of blood drawn from pts LAC to be sent off with LP lab work. 1 successful attempt, pt tolerated well. Gauze and tape applied after. 

## 2022-02-06 NOTE — Discharge Instructions (Signed)
Lumbar Puncture Discharge Instructions ? ?Go home and rest quietly as needed. You may resume normal activities; however, do not exert yourself strongly or do any heavy lifting today and tomorrow.  ? ?DO NOT drive today.   ? ?You may resume your normal diet and medications unless otherwise indicated. Drink lots of extra fluids today and tomorrow.  ? ?The incidence of headache, nausea, or vomiting is about 5% (one in 20 patients).  If you develop a headache, lie flat for 24 hours and drink plenty of fluids until the headache goes away.  Caffeinated beverages may be helpful. If when you get up you still have a headache when standing, go back to bed and force fluids for another 24 hours.  ? ?If you develop severe nausea and vomiting or a headache that does not go away with the flat bedrest after 48 hours, please call [phone].  ? ?Call your physician for a follow-up appointment.  The results of your Lumbar Puncture will be sent directly to your physician and they will contact you.  ? ?If you have any questions or if complications develop after you arrive home, please call [phone]. ? ?Discharge instructions have been explained to the patient.  The patient, or the person responsible for the patient, fully understands these instructions.  ? ?Thank you for visiting our office today. ? ?MAY RESUME ASPIRIN AFTER PROCEDURE!   ?

## 2022-02-07 ENCOUNTER — Telehealth: Payer: Self-pay | Admitting: Neurology

## 2022-02-07 ENCOUNTER — Ambulatory Visit (INDEPENDENT_AMBULATORY_CARE_PROVIDER_SITE_OTHER): Payer: 59 | Admitting: Urology

## 2022-02-07 ENCOUNTER — Encounter: Payer: Self-pay | Admitting: Urology

## 2022-02-07 VITALS — BP 120/82 | HR 111 | Temp 98.5°F

## 2022-02-07 DIAGNOSIS — D239 Other benign neoplasm of skin, unspecified: Secondary | ICD-10-CM | POA: Insufficient documentation

## 2022-02-07 LAB — CYTOLOGY - NON PAP

## 2022-02-07 NOTE — Progress Notes (Signed)
? ?Assessment: ?1. Angiokeratoma of scrotum   ? ?Plan: ?I reassured him that the findings on exam are consistent with benign lesions called angiokeratomas.  No treatment is indicated. ?Return to office prn ? ?Chief Complaint:  ?Chief Complaint  ?Patient presents with  ? scrotal lesions  ? ? ?History of Present Illness: ? ?Elijah Shaffer is a 41 y.o. year old male who is seen in consultation from Lindell Spar, MD for evaluation of scrotal lesions.  He has noted dark purple spots on the scrotum recently.  No pain associated with this.  No scrotal swelling or erythema.  No bleeding. ?He reports nocturia x2.  No dysuria or gross hematuria. ?IPSS = 2 today. ? ? ?Past Medical History:  ?Past Medical History:  ?Diagnosis Date  ? Acute renal injury ([place]) 06/28/2017  ? Anxiety   ? Aspiration pneumonia of right upper lobe due to gastric secretions ([place])   ? Diverticulitis   ? GERD (gastroesophageal reflux disease) 01/08/2018  ? Seasonal allergies   ? Syncope 06/26/2021  ? ? ?Past Surgical History:  ?Past Surgical History:  ?Procedure Laterality Date  ? BIOPSY  03/18/2021  ? Procedure: BIOPSY;  Surgeon: Montez Morita, Quillian Quince, MD;  [facility];  Service: Gastroenterology;;  esophageal at [facility]  ? BIOPSY  05/23/2021  ? Procedure: BIOPSY;  Surgeon: Harvel Quale, MD;  [facility]: [facility];  Service: Gastroenterology;;  ? COLONOSCOPY N/A 09/22/2017  ? Procedure: COLONOSCOPY;  Surgeon: Rogene Houston, MD;  [facility];  Service: Endoscopy;  Laterality: N/A;  9:55  ? COLONOSCOPY WITH PROPOFOL N/A 05/23/2021  ? Procedure: COLONOSCOPY WITH PROPOFOL;  Surgeon: Harvel Quale, MD;  [facility];  Service: Gastroenterology;  Laterality: N/A;  7:30  ? ESOPHAGOGASTRODUODENOSCOPY (EGD) WITH PROPOFOL N/A 03/18/2021  ? Procedure: ESOPHAGOGASTRODUODENOSCOPY (EGD) WITH PROPOFOL;  Surgeon: Harvel Quale, MD;  [facility];  Service: Gastroenterology;   Laterality: N/A;  ? ESOPHAGOGASTRODUODENOSCOPY (EGD) WITH PROPOFOL N/A 05/23/2021  ? Procedure: ESOPHAGOGASTRODUODENOSCOPY (EGD) WITH PROPOFOL;  Surgeon: Harvel Quale, MD;  [facility];  Service: Gastroenterology;  Laterality: N/A;  ? POLYPECTOMY  09/22/2017  ? Procedure: POLYPECTOMY;  Surgeon: Rogene Houston, MD;  [facility];  Service: Endoscopy;;  ? RIGHT/LEFT HEART CATH AND CORONARY ANGIOGRAPHY N/A 12/12/2021  ? Procedure: RIGHT/LEFT HEART CATH AND CORONARY ANGIOGRAPHY;  Surgeon: Jolaine Artist, MD;  Location: [facility];  Service: Cardiovascular;  Laterality: N/A;  ? TRANSTHORACIC ECHOCARDIOGRAM  02/24/2021  ? EF 60 to 65%.  Normal LV function.  Normal valves.  Mild RV dilation with normal function.  ? ? ?Allergies:  ?Allergies  ?Allergen Reactions  ? Claritin [Loratadine] Other (See Comments)  ?  Heart race  ? Loratadine-Pseudoephedrine Er Palpitations  ? Pollen Extract Other (See Comments)  ?  Seasonal allergies  ? ? ?Family History:  ?Family History  ?Problem Relation Age of Onset  ? Healthy Mother   ? Cancer Father   ? Colon cancer Maternal Grandmother   ? Colon cancer Maternal Grandfather   ? ? ?Social History:  ?Social History  ? ?Tobacco Use  ? Smoking status: Never  ?  Passive exposure: Past  ? Smokeless tobacco: Never  ?Vaping Use  ? Vaping Use: Never used  ?Substance Use Topics  ? Alcohol use: Yes  ?  Comment: occ  ? Drug use: No  ? ? ?Review of symptoms:  ?Constitutional:  Negative for unexplained weight loss, night  sweats, fever, chills ?ENT:  Negative for nose bleeds, sinus pain, painful swallowing ?CV:  Negative for chest pain, shortness of breath, exercise intolerance, palpitations, loss of consciousness ?Resp:  Negative for cough, wheezing, shortness of breath ?GI:  Negative for nausea, vomiting, diarrhea, bloody stools ?GU:  Positives noted in HPI; otherwise negative for gross hematuria, dysuria, urinary incontinence ?Neuro:  Negative for  seizures, poor balance, limb weakness, slurred speech ?Psych:  Negative for lack of energy, depression, anxiety ?Endocrine:  Negative for polydipsia, polyuria, symptoms of hypoglycemia (dizziness, hunger, sweating) ?Hematologic:  Negative for anemia, purpura, petechia, prolonged or excessive bleeding, use of anticoagulants  ?Allergic:  Negative for difficulty breathing or choking as a result of exposure to anything; no shellfish allergy; no allergic response (rash/itch) to materials, foods ? ?Physical exam: ?BP 120/82   Pulse (!) 111   Temp 98.5 ?F (36.9 ?C)  ?GENERAL APPEARANCE:  Well appearing, well developed, well nourished, NAD ?HEENT: Atraumatic, Normocephalic, oropharynx clear. ?NECK: Supple without lymphadenopathy or thyromegaly. ?LUNGS: Clear to auscultation bilaterally. ?HEART: Regular Rate and Rhythm without murmurs, gallops, or rubs. ?ABDOMEN: Soft, non-tender, No Masses. ?EXTREMITIES: Moves all extremities well.  Without clubbing, cyanosis, or edema. ?NEUROLOGIC:  Alert and oriented x 3, normal gait, CN II-XII grossly intact.  ?MENTAL STATUS:  Appropriate. ?BACK:  Non-tender to palpation.  No CVAT ?SKIN:  Warm, dry and intact.   ?GU: ?Penis:  circumcised ?Meatus: Normal ?Scrotum: Multiple small purple lesions bilaterally consistent with angiokeratoma ?Testis: normal without masses bilateral ?Epididymis: normal ? ? ?Results: ?None ? ? ?

## 2022-02-07 NOTE — Telephone Encounter (Signed)
Patient called while on lunch and left a mess with the AN. Wants a call back from sheena ?

## 2022-02-07 NOTE — Telephone Encounter (Signed)
Per pt wanted to know if Dr.Jaffe called him,he had a missed call.  ? ?Advised Patient that Dr.Jaffe added two more labs to his CSF Paraneoplastic Panel, flow Cytometry. ?

## 2022-02-08 ENCOUNTER — Ambulatory Visit: Payer: 59 | Admitting: Licensed Clinical Social Worker

## 2022-02-08 ENCOUNTER — Other Ambulatory Visit: Payer: Self-pay | Admitting: Internal Medicine

## 2022-02-08 DIAGNOSIS — R0609 Other forms of dyspnea: Secondary | ICD-10-CM

## 2022-02-08 DIAGNOSIS — S233XXA Sprain of ligaments of thoracic spine, initial encounter: Secondary | ICD-10-CM | POA: Diagnosis not present

## 2022-02-08 DIAGNOSIS — K219 Gastro-esophageal reflux disease without esophagitis: Secondary | ICD-10-CM

## 2022-02-08 DIAGNOSIS — A770 Spotted fever due to Rickettsia rickettsii: Secondary | ICD-10-CM

## 2022-02-08 DIAGNOSIS — S338XXA Sprain of other parts of lumbar spine and pelvis, initial encounter: Secondary | ICD-10-CM | POA: Diagnosis not present

## 2022-02-08 DIAGNOSIS — S134XXA Sprain of ligaments of cervical spine, initial encounter: Secondary | ICD-10-CM | POA: Diagnosis not present

## 2022-02-08 DIAGNOSIS — E782 Mixed hyperlipidemia: Secondary | ICD-10-CM

## 2022-02-08 DIAGNOSIS — M4004 Postural kyphosis, thoracic region: Secondary | ICD-10-CM | POA: Diagnosis not present

## 2022-02-08 DIAGNOSIS — M7061 Trochanteric bursitis, right hip: Secondary | ICD-10-CM | POA: Diagnosis not present

## 2022-02-08 DIAGNOSIS — K2270 Barrett's esophagus without dysplasia: Secondary | ICD-10-CM

## 2022-02-08 DIAGNOSIS — F419 Anxiety disorder, unspecified: Secondary | ICD-10-CM

## 2022-02-08 DIAGNOSIS — R29818 Other symptoms and signs involving the nervous system: Secondary | ICD-10-CM

## 2022-02-08 NOTE — Chronic Care Management (AMB) (Signed)
?Chronic Care Management  ? ? Clinical Social Work Note ? ?02/08/2022 ?Name: ABDALRAHMAN CLEMENTSON MRN: [id number] DOB: January 09, 1981 ? ?ELICEO GLADU is a 41 y.o. year old male who is a primary care patient of Lindell Spar, MD. The CCM team was consulted to assist the patient with chronic disease management and/or care coordination needs related to: [employer] .  ? ?Engaged with patient face to face for initial visit in response to provider referral for social work chronic care management and care coordination services.  ? ?Consent to Services:  ?The patient was given the following information about Chronic Care Management services today, agreed to services, and gave verbal consent: 1. CCM service includes personalized support from designated clinical staff supervised by the primary care provider, including individualized plan of care and coordination with other care providers 2. 24/7 contact phone numbers for assistance for urgent and routine care needs. 3. Service will only be billed when office clinical staff spend 20 minutes or more in a month to coordinate care. 4. Only one practitioner may furnish and bill the service in a calendar month. 5.The patient may stop CCM services at any time (effective at the end of the month) by phone call to the office staff. 6. The patient will be responsible for cost sharing (co-pay) of up to 20% of the service fee (after annual deductible is met). Patient agreed to services and consent obtained. ? ?Patient agreed to services and consent obtained.  ? ?Assessment: Review of patient past medical history, allergies, medications, and health status, including review of relevant consultants reports was performed today as part of a comprehensive evaluation and provision of chronic care management and care coordination services.    ? ?SDOH (Social Determinants of Health) assessments and interventions performed:  ?SDOH Interventions   ? ?Flowsheet Row Most Recent Value  ?SDOH  Interventions   ?Stress Interventions Provide Counseling  [client has stress related to managing tremors. client has stress over decreased functional abilities]  ?Depression Interventions/Treatment  Medication, Counseling  ? ?  ?  ? ?Advanced Directives Status: See Vynca application for related entries. ? ?CCM Care Plan ? ?Allergies  ?Allergen Reactions  ? Claritin [Loratadine] Other (See Comments)  ?  Heart race  ? Loratadine-Pseudoephedrine Er Palpitations  ? Pollen Extract Other (See Comments)  ?  Seasonal allergies  ? ? ?Outpatient Encounter Medications as of 02/08/2022  ?Medication Sig  ? aspirin EC 81 MG tablet Take 81 mg by mouth daily. Swallow whole.  ? b complex vitamins capsule Take 1 capsule by mouth daily. With D3 ?gummie  ? betamethasone dipropionate 0.05 % cream Apply 1 application topically 2 (two) times daily as needed (Rash).  ? cetirizine (ZYRTEC) 10 MG tablet Take 10 mg by mouth daily.  ? diphenhydrAMINE-zinc acetate (BENADRYL EXTRA STRENGTH) cream Apply 1 application topically 3 (three) times daily as needed for itching.  ? docusate sodium (COLACE) 100 MG capsule Take 100 mg by mouth 2 (two) times daily.  ? doxycycline (VIBRAMYCIN) 100 MG capsule Take 100 mg by mouth 2 (two) times daily.  ? FLUoxetine (PROZAC) 20 MG capsule Take 1 capsule (20 mg total) by mouth daily.  ? fluticasone (FLONASE ALLERGY RELIEF) 50 MCG/ACT nasal spray Place 2 sprays into both nostrils daily as needed for allergies or rhinitis. (Patient taking differently: Place 2 sprays into both nostrils daily.)  ? fluticasone-salmeterol (ADVAIR) 250-50 MCG/ACT AEPB SMARTSIG:1 Puff(s) By Mouth Morning-Night  ? guaiFENesin (MUCINEX) 600 MG 12 hr tablet Take 600 mg by  mouth 2 (two) times daily as needed for to loosen phlegm.  ? hydrOXYzine (ATARAX) 25 MG tablet Take 25 mg by mouth every 8 (eight) hours as needed.  ? hydrOXYzine (ATARAX/VISTARIL) 10 MG tablet Take 1 tablet (10 mg total) by mouth 3 (three) times daily as needed for  anxiety.  ? meclizine (ANTIVERT) 12.5 MG tablet Take 12.5 mg by mouth daily as needed for dizziness.  ? montelukast (SINGULAIR) 10 MG tablet Take 1 tablet (10 mg total) by mouth at bedtime.  ? omega-3 acid ethyl esters (LOVAZA) 1 g capsule Take 2 capsules (2 g total) by mouth 2 (two) times daily.  ? ondansetron (ZOFRAN) 4 MG tablet Take 1 tablet (4 mg total) by mouth every 6 (six) hours as needed for nausea.  ? pantoprazole (PROTONIX) 40 MG tablet Take 1 tablet (40 mg total) by mouth 2 (two) times daily.  ? Potassium 99 MG TABS Take 1 tablet by mouth daily.  ? propranolol (INDERAL) 20 MG tablet Take one tablet twice daily as needed for palpitations  ? triamcinolone cream (KENALOG) 0.1 % Apply 1 application topically 2 (two) times daily.  ? ?No facility-administered encounter medications on file as of 02/08/2022.  ? ? ?Patient Active Problem List  ? Diagnosis Date Noted  ? 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 [place] spotted fever 12/31/2021  ? Encounter for examination following treatment at hospital 12/31/2021  ? 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 Anal itching 12/07/2021  ? Chronic sinusitis 12/04/2021  ? Mixed hyperlipidemia 11/30/2021  ? Esophageal hiatal hernia 11/22/2021  ? Constipation 11/22/2021  ? Dyspnea on minimal exertion 06/28/2021  ? Ehrlichiosis 16/08/9603  ? Anxiety 06/26/2021  ? Eczema 06/26/2021  ? Barrett's esophagus 04/11/2021  ? History of colonic polyps 04/11/2021  ? 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 GERD (gastroesophageal reflux disease) 01/08/2018  ? ? ?Conditions to be addressed/monitored: monitor client completion of daily activities; monitor client management of depression issues ? ?Care Plan : LCSW Care Plan  ?Updates made by Katha Cabal, LCSW since 02/08/2022 12:00 AM  ?  ? ?Problem: Coping Skills (General Plan of Care)   ?  ? ?Goal: Coping Skills Enhanced. Manage daily needs. Manage ADLs completion   ?Start Date: 02/08/2022  ?Expected End Date: 05/08/2022  ?This  Visit's Progress: Not on track  ?Priority: High  ?Note:   ?Current barriers:   ?Has tremors ?Has transport needs ?Increased appetite ?Increased sleep ? ?Clinical Goals:  ?LCSW to talk with client in next 30 days via phone or in person to discuss client coping skills for daily needs ?Client to attend scheduled medical appointments in next 30 days ?Client to communicate with RNCM as needed in next 30 days for nursing support ?LCSW to talk with client in next 30 days about client management of depression issues ? ?Clinical Interventions:  ?Collaboration with Lindell Spar, MD regarding development and update of comprehensive plan of care as evidenced by provider attestation and co-signature ?Assessment of needs, barriers of client  ?Assessments completed:  PHQ-2/9; GAD -7 ?Reviewed tremor issues with client. Strong shaking in right hand; some shaking in left hand. He said he has occasional shaking of his head.  He said this started a few weeks ago. He no longer drives in order to stay safe. He has support from his mother. He said he has gone to 2 neurologists for evaluation. He has another appointment with neurologist in [place] in April of 2023 ?Discussed medication procurement ?Discussed employment of client. 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  ?Discussed family support for client ?Reviewed walking issues of client. Reviewed appetite of client. He said he is eating more recently ?Discussed type of medications received. He had trouble remembering names of his medications. He said he has difficulty remembering things or recalling details. ?LCSW collaborated today with NP related to client needs ? ?Patient Coping Skills:  ?Has support from his mother ?Has no transport needs ?Attends medical appointments ? ?Patient  Deficits ? ?Tremors ?Depression issues ? ?Patient Goals: ? ?Attend medical appointments in next 30 days ?Patient will communicate with LCSW in next 30 days to discuss coping skills of client ?Patient will communicate as ne

## 2022-02-08 NOTE — Patient Instructions (Signed)
Visit Information ? ?Patient Goals:  Manage daily tasks; manage depression issues ? ?Time Frame:  Short Term Goal ?Priority:  High ?Progress:  Not On Track ? ?Start Date:  02/08/22 ?End Date:  05/07/22 ? ?Follow Up Date:  03/29/22 at 2:00 PM ? ?Manage daily tasks; manage depression issues ? ?Patient Coping Skills:  ?Has support from his mother ?Has no transport needs ?Attends medical appointments ? ?Patient Deficits ? ?Tremors ?Depression issues ? ?Patient Goals: ? ?Attend medical appointments in next 30 days ?Patient will communicate with LCSW in next 30 days to discuss coping skills of client ?Patient will communicate as needed with RNCM to discuss nursing needs of client ? ?Follow Up Plan: LCSW to call client on 03/29/22 at 2:00 PM  ? ?Norva Riffle.Min Collymore MSW, LCSW ?Licensed Clinical Social Worker ?[facility] ?[phone] ?

## 2022-02-11 ENCOUNTER — Ambulatory Visit (INDEPENDENT_AMBULATORY_CARE_PROVIDER_SITE_OTHER): Payer: 59 | Admitting: Urology

## 2022-02-11 ENCOUNTER — Telehealth: Payer: Self-pay

## 2022-02-11 ENCOUNTER — Encounter: Payer: Self-pay | Admitting: Urology

## 2022-02-11 ENCOUNTER — Ambulatory Visit: Payer: 59 | Admitting: Family Medicine

## 2022-02-11 VITALS — BP 135/85 | HR 108 | Ht 75.0 in | Wt 254.0 lb

## 2022-02-11 DIAGNOSIS — R109 Unspecified abdominal pain: Secondary | ICD-10-CM | POA: Diagnosis not present

## 2022-02-11 LAB — MICROSCOPIC EXAMINATION: RBC, Urine: NONE SEEN /hpf (ref 0–2)

## 2022-02-11 LAB — URINALYSIS, ROUTINE W REFLEX MICROSCOPIC
Bilirubin, UA: NEGATIVE
Leukocytes,UA: NEGATIVE
Nitrite, UA: NEGATIVE
RBC, UA: NEGATIVE
Specific Gravity, UA: 1.025 (ref 1.005–1.030)
Urobilinogen, Ur: 0.2 mg/dL (ref 0.2–1.0)
pH, UA: 6.5 (ref 5.0–7.5)

## 2022-02-11 NOTE — Progress Notes (Signed)
? ?Assessment: ?1. Bilateral flank pain   ? ? ?Plan: ?I discussed possible causes of his bilateral flank pain.  I advised him that bilateral ureteral calculi with obstruction is an unusual event.  There is no obvious evidence of infection on the urinalysis today. ?I discussed further evaluation with a CT renal stone study.  He would like to proceed with this. ?CT renal stone study at [facility]. ?I will contact him with results. ? ?Chief Complaint:  ?Chief Complaint  ?Patient presents with  ? Flank Pain  ? ? ?History of Present Illness: ? ?Elijah Shaffer is a 41 y.o. year old male who is seen for evaluation of bilateral flank pain.  He had onset of right-sided flank pain approximately 3-4 days ago.  He has also had some intermittent left-sided flank pain.  He describes the pain as intermittent and sharp in nature.  He notices the pain while walking as well as lying down.  No prior history of kidney stones.  No urinary symptoms.  No dysuria or gross hematuria. ?Creatinine 1.23 from 01/31/2022 ? ?He was seen on 02/07/22 for scrotal lesions.  He noted dark purple spots on the scrotum recently.  No pain associated with this.  No scrotal swelling or erythema.  No bleeding. ?He reports nocturia x2.  No dysuria or gross hematuria. ?IPSS = 2. ? ? ?Past Medical History:  ?Past Medical History:  ?Diagnosis Date  ? Acute renal injury ([place]) 06/28/2017  ? Anxiety   ? Aspiration pneumonia of right upper lobe due to gastric secretions ([place])   ? Diverticulitis   ? GERD (gastroesophageal reflux disease) 01/08/2018  ? Seasonal allergies   ? Syncope 06/26/2021  ? ? ?Past Surgical History:  ?Past Surgical History:  ?Procedure Laterality Date  ? BIOPSY  03/18/2021  ? Procedure: BIOPSY;  Surgeon: Montez Morita, Quillian Quince, MD;  [facility];  Service: Gastroenterology;;  esophageal at [facility]  ? BIOPSY  05/23/2021  ? Procedure: BIOPSY;  Surgeon: Harvel Quale, MD;  [facility];  Service: Gastroenterology;;   ? COLONOSCOPY N/A 09/22/2017  ? Procedure: COLONOSCOPY;  Surgeon: Rogene Houston, MD;  [facility];  Service: Endoscopy;  Laterality: N/A;  9:55  ? COLONOSCOPY WITH PROPOFOL N/A 05/23/2021  ? Procedure: COLONOSCOPY WITH PROPOFOL;  Surgeon: Harvel Quale, MD;  [facility];  Service: Gastroenterology;  Laterality: N/A;  7:30  ? ESOPHAGOGASTRODUODENOSCOPY (EGD) WITH PROPOFOL N/A 03/18/2021  ? Procedure: ESOPHAGOGASTRODUODENOSCOPY (EGD) WITH PROPOFOL;  Surgeon: Harvel Quale, MD;  [facility];  Service: Gastroenterology;  Laterality: N/A;  ? ESOPHAGOGASTRODUODENOSCOPY (EGD) WITH PROPOFOL N/A 05/23/2021  ? Procedure: ESOPHAGOGASTRODUODENOSCOPY (EGD) WITH PROPOFOL;  Surgeon: Harvel Quale, MD;  [facility];  Service: Gastroenterology;  Laterality: N/A;  ? POLYPECTOMY  09/22/2017  ? Procedure: POLYPECTOMY;  Surgeon: Rogene Houston, MD;  [facility];  Service: Endoscopy;;  ? RIGHT/LEFT HEART CATH AND CORONARY ANGIOGRAPHY N/A 12/12/2021  ? Procedure: RIGHT/LEFT HEART CATH AND CORONARY ANGIOGRAPHY;  Surgeon: Jolaine Artist, MD;  Location: [facility];  Service: Cardiovascular;  Laterality: N/A;  ? TRANSTHORACIC ECHOCARDIOGRAM  02/24/2021  ? EF 60 to 65%.  Normal LV function.  Normal valves.  Mild RV dilation with normal function.  ? ? ?Allergies:  ?Allergies  ?Allergen Reactions  ? Claritin [Loratadine] Other (See Comments)  ?  Heart race  ? Loratadine-Pseudoephedrine Er Palpitations  ? Pollen Extract Other (See Comments)  ?  Seasonal  allergies  ? ? ?Family History:  ?Family History  ?Problem Relation Age of Onset  ? Healthy Mother   ? Cancer Father   ? Colon cancer Maternal Grandmother   ? Colon cancer Maternal Grandfather   ? ? ?Social History:  ?Social History  ? ?Tobacco Use  ? Smoking status: Never  ?  Passive exposure: Past  ? Smokeless tobacco: Never  ?Vaping Use  ? Vaping Use: Never used  ?Substance Use  Topics  ? Alcohol use: Yes  ?  Comment: occ  ? Drug use: No  ? ? ?ROS: ?Constitutional:  Negative for fever, chills, weight loss ?CV: Negative for chest pain, previous MI, hypertension ?Respiratory:  Negative for shortness of breath, wheezing, sleep apnea, frequent cough ?GI:  Negative for nausea, vomiting, bloody stool, GERD ? ?Physical exam: ?BP 135/85   Pulse (!) 108   Ht '6\' 3"'$  (1.905 m)   Wt 254 lb (115.2 kg)   BMI 31.75 kg/m?  ?GENERAL APPEARANCE:  Well appearing, well developed, well nourished, NAD ?HEENT:  Atraumatic, normocephalic, oropharynx clear ?NECK:  Supple without lymphadenopathy or thyromegaly ?ABDOMEN:  Soft, non-tender, no masses ?EXTREMITIES:  Moves all extremities well, without clubbing, cyanosis, or edema ?NEUROLOGIC:  Alert and oriented x 3, normal gait, CN II-XII grossly intact ?MENTAL STATUS:  appropriate ?BACK:  Non-tender to palpation, No CVAT ?SKIN:  Warm, dry, and intact ? ? ?Results: ?U/A:  0-5 WBC, few bacteria ? ?

## 2022-02-11 NOTE — Telephone Encounter (Signed)
Elijah Shaffer came to the front desk and said to cancel [name] appt this am at 9.  I called the patient and he said this is for a UTI.  I told him Dr Posey Pronto said to cancel the appt.  He was upset and hung up.  His mother called back and asked for him to be seen for UTI or med called in.  Please call the patient or mother back and advise.   ?

## 2022-02-11 NOTE — Progress Notes (Addendum)
? ?NEUROLOGY FOLLOW UP OFFICE NOTE ? ?Elijah Shaffer ?[id number] ? ?Assessment/Plan:  ? ?Complex patient requiring high degree of medical decision making. 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  This may be a cause for numbness and tingling but unusual in absence of muscle weakness.  However it would not explain most of his symptoms. ?Hyperreflexia - unclear etiology.  No explanation on brain and cervical MRI.  Will check GAD antibodies ?Dizziness/gait instability.  No subsequent syncope.  Dizziness may suggest dysautonomia due to neuropathy ?Myalgias, dyspnea - unclear etiology ?Tremor - appears psychogenic.  Will check EEG to make sure there isn't any epileptiform discharges. ?Multiple systemic symptoms - headache, fatigue, chills - unclear if related to ehrlichosis or viral illness. ?Episode of facial asymmetry - given his age and lack of risk factors, I doubt TIA.  Reported right sided numbness but the only thing objectively noted on exam was possible facial droop (only noted when he smiled).  Bell's palsy most likely. ? 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 This can explain his paresthesias but would not explain the majority of his symptoms.  Usually with this type of neuropathy, I would expect weakness which he does not demonstrate.  Workup by other specialists have also been unremarkable thus far.  His tremor appears psychogenic raising the possibility that the majority of his symptoms (dizziness, hyperreflexia, dyspnea, fatigue) may be psychosomatic.  In setting of underlying neuropathy, dizziness may be dysautonomia but I have my doubts, especially in light of no weakness on exam.  I think the paraneoplastic panel will help sort this out. ?  ?Still waiting on remaining results from CSF, especially paraneoplastic panel ?Will check GAD antibodies ?Will check routine EEG ?Follow  up in 4 weeks.  ? ?02/20/2022 ADDENDUM: ?02/06/2022 CSF:  cell count 0, elevated protein 70, glucose 68, no oligoclonal bands, IgG synthesis rate/index 0.1/0.52, myelin basic protein negative, Lyme IgG/IgM ab negative, GS and culture negative, cytology negative, ACE 2, paraneoplastic antibody expanded evaluation negative ?02/12/2022 GAD ab negative ?02/19/2022 EEG normal ? 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  However, symptomatically he only endorses some numbness and tingling in regards to neuropathy and typically CIDP would be accompanied by weakness.  I am not sure if these findings indicate need to treat (such as with steroids or IVIG) especially given that it would not explain his other symptoms.  Autoimmune encephalopathy/paraneoplastic panel was negative.  As his tremor appears psychogenic, his constellation of symptoms may be psychosomatic as well, but that would be diagnosis of exclusion.  He is hyperreflexic without any objective pathologic etiology and this may be physiologic for him in light of his anxiety.  In regards to the finding of neuropathy, I will refer to [facility] for their opinion of findings and management. ? ?Metta Clines, DO ?  ?  ?Subjective:  ?Elijah Shaffer is a 41 year old left-handed male who follows up for dizziness/syncope, elevated CK and neuropathy. ?  ?UPDATE: ?Seen in ED on 12/27/2021 for SOB.  Serum Lyme, BNP, d-dimer, and troponin and RMSF IgG were negative but RMSF IgM was borderline elevated at 1.67.  Followed up with ID who believed that the RMSF IgM was false positive.  MRI of cervical spine with and without contrast on 01/08/2022 personally reviewed revealed spurring and disc protrusion at C5-6 causing moderate right foraminal encroachment and slight  cord flattening on the right but no significant canal stenosis or evidence of myelopathy.  Seen in ED on 01/31/2022 for full body tremors.  As  documented by the ED provider, patient exhibited several episodes of seizure-like activity with full body shaking lasting 30 seconds with immediate return to baseline with no postictal confusion, tongue biting or incontinence.  Did not appear to be epileptiform.  Since then, he has had a persistent tremor in his right hand.  When he uses his left hand, it will also start to shake.  He is now fatigued all of the time.  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  Underwent LP on 02/06/2022 for CSF analysis which revealed cell count 0, elevated protein 70, glucose 68, negative culture, negative cytology.  Paraneoplastic panel, ACE, Lyme, oligoclonal bands, IgG index, and myelin basic protein still pending.   ? ?HISTORY: ?In April 2022, he was admitted to [facility] for left sided facial droop, right sided numbness and abdominal pain.  Did not receive t-PA due to mild symptoms (NIHSS score of 1 due to mild facial palsy).  As per tele-stroke neurologist's note, he did not exhibit facial asymmetry when talking or at rest, only when smiling.  CT head negative for acute findings.  MRI of brain negative for stroke.  MRA of head negative for LVO or stenosis.  Carotid ultrasound negative for stenosis.  Echocardiogram showed EF 60-65% with no cardiac source of embolus.  LDL was 94, Hgb A1c 5.4, HIV negative, Sars Coronovirus 2 negative, UDS positive only for benzo.  It was decided that patient likely had a Bell's palsy vs TIA.  He was discharged on acyclovir and was supposed to be discharged on ASA and Plavix but appears it was never prescribed.  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  He began not feeling well including headache, chills and fever.  In May, he was  outside when he had a recurrence of this and then passed out.  He was told that he was shaking on the ground and was difficult to arouse when EMS arrived.  He vomited and had coffee-ground emesis.  He was admitted to the hospital where he was diagnosed with upper GI bleed and aspiration pneumonia.  He was discharged on PPI and antibiotics.  Since then, he has been to the ED on several occasions for headache, fatigue and fever and chills.  He was diagnosed with viral illness,  acute pharyngitis and dehydration.  One of the labs from the ED in August was positive for ehrlichosis and he was prescribed doxycycline.  He continued to have systemic symptoms such as cough and was prescribed another round of doxycycline.  He has an appointment with ID.  MRI of brain on 07/19/2021 was normal.  For shortness of breath, he was evaluated by pulmonology with normal work up including high-resolution CT chest and PFTs.  He was then evaluated by cardiology who diagnosed pulmonary hypertension.  He was evaluated by GI and diagnosed with Barrett's esophagus, hiatal hernia and colon polyps. In November, he was found to have an elevated CK of 356.  Other labs include negative ANA with negative ENA panel, negative anti-CCP, ESR 2, CRP <1, negative RF, aldolase 6, negative Myomarker3 panel,, RNP 1.4 He reports some nocturnal cramps in his calves and sometimes  involving the entire leg.  No rash or weakness.  No family history of autoimmune disease.  He was referred to rheumatology.  Repeat CK on 12/17/2021 was 105.  Rheumatologic workup was negative.   ? ?PAST MEDICAL HISTORY: ?Past Medical History:  ?Diagnosis Date  ? Acute renal injury ([place]) 06/28/2017  ? Anxiety   ? Aspiration pneumonia of right upper lobe due to gastric secretions ([place])   ? Diverticulitis   ? GERD (gastroesophageal reflux disease) 01/08/2018  ? Seasonal allergies   ? Syncope 06/26/2021  ? ? ?MEDICATIONS: ?Current Outpatient Medications on File Prior to Visit  ?Medication Sig  Dispense Refill  ? aspirin EC 81 MG tablet Take 81 mg by mouth daily. Swallow whole.    ? b complex vitamins capsule Take 1 capsule by mouth daily. With D3 ?gummie    ? betamethasone dipropionate 0.05 % cream

## 2022-02-12 ENCOUNTER — Encounter: Payer: Self-pay | Admitting: Neurology

## 2022-02-12 ENCOUNTER — Telehealth: Payer: Self-pay

## 2022-02-12 ENCOUNTER — Telehealth: Payer: Self-pay | Admitting: Neurology

## 2022-02-12 ENCOUNTER — Other Ambulatory Visit (INDEPENDENT_AMBULATORY_CARE_PROVIDER_SITE_OTHER): Payer: 59

## 2022-02-12 ENCOUNTER — Ambulatory Visit: Payer: 59 | Admitting: Neurology

## 2022-02-12 VITALS — BP 128/86 | HR 97 | Resp 18 | Ht 75.0 in | Wt 254.0 lb

## 2022-02-12 DIAGNOSIS — G610 Guillain-Barre syndrome: Secondary | ICD-10-CM

## 2022-02-12 DIAGNOSIS — R29898 Other symptoms and signs involving the musculoskeletal system: Secondary | ICD-10-CM

## 2022-02-12 DIAGNOSIS — R69 Illness, unspecified: Secondary | ICD-10-CM | POA: Diagnosis not present

## 2022-02-12 DIAGNOSIS — F444 Conversion disorder with motor symptom or deficit: Secondary | ICD-10-CM | POA: Diagnosis not present

## 2022-02-12 NOTE — Telephone Encounter (Signed)
Pt came into the office today and went downstairs to do labs. He stated once he did his labs he was feeling lightheaded, hot and sweaty, and nauseous. He says he feels this way off and on even not when having blood drawn. He wanted to make sure Dr. Tomi Likens was aware of that symptom. He stated he doesn't think he mentioned it in the appointment.  ?

## 2022-02-12 NOTE — Telephone Encounter (Signed)
Noted thank you for following up on it  ?

## 2022-02-12 NOTE — Patient Instructions (Signed)
Still waiting on test results from spinal fluid ?Would also like to check GAD antibodies ?Will also check EEG ?Follow up in 4 to 6 weeks. ?

## 2022-02-12 NOTE — Telephone Encounter (Signed)
Called patient, patient was seen at the urologist's yesterday no appt needed.  They are sending him for more test. ?

## 2022-02-12 NOTE — Telephone Encounter (Signed)
Elijah Shaffer came to the front desk and said to cancel [name] appt this am at 9.  I called the patient and he said this is for a UTI.  I told him Dr Posey Pronto said to cancel the appt.  He was upset and hung up.  His mother called back and asked for him to be seen for UTI or med called in.  Please call the patient or mother back and advise.   ?

## 2022-02-13 DIAGNOSIS — M4004 Postural kyphosis, thoracic region: Secondary | ICD-10-CM | POA: Diagnosis not present

## 2022-02-13 DIAGNOSIS — S338XXA Sprain of other parts of lumbar spine and pelvis, initial encounter: Secondary | ICD-10-CM | POA: Diagnosis not present

## 2022-02-13 DIAGNOSIS — M7061 Trochanteric bursitis, right hip: Secondary | ICD-10-CM | POA: Diagnosis not present

## 2022-02-13 DIAGNOSIS — R569 Unspecified convulsions: Secondary | ICD-10-CM | POA: Diagnosis not present

## 2022-02-13 DIAGNOSIS — S233XXA Sprain of ligaments of thoracic spine, initial encounter: Secondary | ICD-10-CM | POA: Diagnosis not present

## 2022-02-13 DIAGNOSIS — S134XXA Sprain of ligaments of cervical spine, initial encounter: Secondary | ICD-10-CM | POA: Diagnosis not present

## 2022-02-14 LAB — GLUTAMIC ACID DECARBOXYLASE AUTO ABS: Glutamic Acid Decarb Ab: <5 IU/mL (ref <5)

## 2022-02-15 ENCOUNTER — Encounter: Payer: Self-pay | Admitting: Pulmonary Disease

## 2022-02-15 ENCOUNTER — Ambulatory Visit (INDEPENDENT_AMBULATORY_CARE_PROVIDER_SITE_OTHER): Payer: 59 | Admitting: Pulmonary Disease

## 2022-02-15 VITALS — BP 132/84 | HR 92 | Ht 75.0 in | Wt 256.6 lb

## 2022-02-15 DIAGNOSIS — R0609 Other forms of dyspnea: Secondary | ICD-10-CM

## 2022-02-15 DIAGNOSIS — G4733 Obstructive sleep apnea (adult) (pediatric): Secondary | ICD-10-CM

## 2022-02-15 NOTE — Patient Instructions (Signed)
Your CPAP compliance is great. Continue to use each night.  ? ?Try finding padded bandages that you can cut to size to place on your nose if you'd like to return to your old CPAP mask.  ? ?Ok to stop montelukast medication. This was for your allergies/breathing.  ? ?Look into seeing a Neuromuscular specialist at [facility] or [place].  ? ?Follow up in 6 months ?

## 2022-02-15 NOTE — Progress Notes (Signed)
? ?Synopsis: Referred in September 2022 for Cough and shortness of breath ? ?Subjective:  ? ?PATIENT ID: Elijah Shaffer GENDER: male DOB: 1981-10-13, MRN: [id number] ? ?HPI ? ?Chief Complaint  ?Patient presents with  ? Follow-up  ?  2 mo f/u. States he is still having the SOB with exertion and fatigue. Using cpap machine.   ? ?Jujhar Everett is a 41 year old male, never smoker with history of GERD, large hiatal hernia and seasonal allergies who returns to pulmonary clinic for follow up of dyspnea, fatigue and obstructive sleep apnea.  ? ?He was started on CPAP therapy and his CPAP download shows 100% compliance with 0.7 AHI. He initially had a full face mask which was switched to a hybrid nasal pillow full face mask due to the skin on his nose getting sore. ? ?He is following with Neurology. He had an abnormal EMG and has elevated protein on CSF evaluation. There are CSF tests pending at this time.  ? ?OV 01/02/22 ?He continues to have easy fatigability and dyspnea with exertion. Heart cath 2/1 shows normal coronary arteries and normal hemodynamics. ICS/LABA inhaler therapy stopped at last visit with no changes in his symptoms. ? ?He is being evaluated by rheumatology for elevated CK level and RNP ab level. He is being evaluated for numbness and tingling symptoms by neurology and is ordered for an EMG and cervical spine MRI.  ? ?He went to urgent care on 2/16 for shortness of breath and chest pains. Given patient's concerns for tick exposures, lyme and [place] spotted fever labs ordered. RMSF IgG is negative and IgM is elevated at 1.67. D-dimer 2/16 was not elevated. He saw his PCP on 12/31/21 and was prescribed doxycyline but has not taken this medication yet.  ? ?Home sleep study 12/12/21 showed moderate obstructive sleep apnea.  ? ?OV 10/31/21 ?HRCT chest does not indicate ILD involvement. There is a large hiatal hernia noted. Pulmonary function tests show mild diffusion defect with normal spirometry and normal  TLC. It was difficult to get consistent data during the pulmonary function testing as the patient was getting fatigued. ? ?Echo from 02/24/21 showed mildly dilated RV with normal function.  ? ?CK level is mildly elevated at 356 and ENA RNP ab is elevated. Remaining inflammatory workup is unremarkable which included myositis panel, ANA, CCP, dsDNA, RF, ESR, CRP and ddimer. Hypersensitivity pneumonitis panel is negative. ? ?He continues to have exertional dyspnea and easily fatigued with exertion. He describes a pre-syncopal event when working recently. He continues to work as a Development worker, international aid.  ? ?He reports a small raised dry patch of skin on left lower extremity, lateral side and similar rash on right lateral ankle that is itchy. He also reports feeling pain in his legs when laying down at night, even with out a hard days work.  ? ? ?OV 10/02/21 ?He called in after being seen on 11/18 complaining of similar complaints as discussed in the office visit that day. He complains of more fatigue than normal and shortness of breath with exertion. He has felt lightheaded with exertion before. He feels very run down. He denies any fevers or chills. His appetite remains good. ? ?OV 09/28/21 ?He continues to experience exertional dyspnea. He was trialed on as needed albuterol with some improvement but continues to experience significant dyspnea with exertion. For example, he was short of breath walking around [employer] this past week. He denies wheezing. He does have cough with greenish-yellowish sputum.  ? ?Nasal congestion  is improved with fluticasone nasal spray. 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  ? ?He was unable to have PFTs done since last visit as his mother was recently diagnosed with cervical cancer and they were occupied with her treatment schedule.  ? ?He denies leg swelling, chest pain or syncopal episodes. ? ?He raised the question with his previous work exposures using round-up and other herbicide chemicals  could lead to his breathing issues. ? ?OV 07/24/21 ?He reports shortness of breath over the last 4 months.  The shortness of breath is evident with exertion and resolves with rest.  Patient has been treated for tickborne illness with concern for Ehrlichia and has been on doxycycline.  He is being evaluated by infectious disease today.  He has been much more inactive over these last 4 months.  He works as a Development worker, international aid and has not been working as much due to the shortness of breath.  He denies any wheezing or chest tightness.  He has intermittent dry cough that is occasionally productive with sputum.  He is being treated with PPI therapy for GERD and has a small hiatal hernia on CT imaging. ? ?He does report seasonal allergies with nasal and sinus congestion more so in the spring and fall. ? 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  He denies any history of blood clots.  No family history of blood clots.  No family history of lung disease. ? ?No history of smoking or vaping.  He does report some intermittent secondhand smoke exposure via social gatherings.  He did grow up in a home with a wood stove. ? ?Past Medical History:  ?Diagnosis Date  ? Acute renal injury ([place]) 06/28/2017  ? Anxiety   ? Aspiration pneumonia of right upper lobe due to gastric secretions ([place])   ? Diverticulitis   ? GERD (gastroesophageal reflux disease) 01/08/2018  ? Seasonal allergies   ? Syncope 06/26/2021  ?  ? ?Family History  ?Problem Relation Age of Onset  ? Healthy Mother   ? Cancer Father   ? Colon cancer Maternal Grandmother   ? Colon cancer Maternal Grandfather   ?  ? ?Social History  ? ?Socioeconomic History  ? Marital status: Single  ?  Spouse name: Not on file  ? Number of children: Not on file  ? Years of education: Not on file  ? Highest education level: Not on file  ?Occupational History  ? Not on file  ?Tobacco Use  ? Smoking status: Never  ?  Passive exposure: Past  ? Smokeless tobacco: Never  ?Vaping Use  ?  Vaping Use: Never used  ?Substance and Sexual Activity  ? Alcohol use: Yes  ?  Comment: occ  ? Drug use: No  ? Sexual activity: Not on file  ?Other Topics Concern  ? Not on file  ?Social History Narrative  ? Not on file  ? ?Social Determinants of Health  ? ?Financial Resource Strain: Not on file  ?Food Insecurity: Not on file  ?Transportation Needs: Not on file  ?Physical Activity: Not on file  ?Stress: Stress Concern Present  ? Feeling of Stress : Rather much  ?Social Connections: Not on file  ?Intimate Partner Violence: Not on file  ?  ? ?Allergies  ?Allergen Reactions  ? Claritin [Loratadine] Other (See Comments)  ?  Heart race  ? Loratadine-Pseudoephedrine Er Palpitations  ? Pollen Extract Other (See Comments)  ?  Seasonal allergies  ?  ? ?Outpatient  Medications Prior to Visit  ?Medication Sig Dispense Refill  ? aspirin EC 81 MG tablet Take 81 mg by mouth daily. Swallow whole.    ? b complex vitamins capsule Take 1 capsule by mouth daily. With D3 ?gummie    ? betamethasone dipropionate 0.05 % cream Apply 1 application topically 2 (two) times daily as needed (Rash).    ? cetirizine (ZYRTEC) 10 MG tablet Take 10 mg by mouth daily.    ? diphenhydrAMINE-zinc acetate (BENADRYL EXTRA STRENGTH) cream Apply 1 application topically 3 (three) times daily as needed for itching. 28.4 g 0  ? docusate sodium (COLACE) 100 MG capsule Take 100 mg by mouth 2 (two) times daily.    ? FLUoxetine (PROZAC) 20 MG capsule Take 1 capsule (20 mg total) by mouth daily. 90 capsule 1  ? fluticasone (FLONASE ALLERGY RELIEF) 50 MCG/ACT nasal spray Place 2 sprays into both nostrils daily as needed for allergies or rhinitis. (Patient taking differently: Place 2 sprays into both nostrils daily.) 9.9 mL 2  ? guaiFENesin (MUCINEX) 600 MG 12 hr tablet Take 600 mg by mouth 2 (two) times daily as needed for to loosen phlegm.    ? hydrOXYzine (ATARAX) 25 MG tablet Take 25 mg by mouth every 8 (eight) hours as needed.    ? hydrOXYzine (ATARAX/VISTARIL)  10 MG tablet Take 1 tablet (10 mg total) by mouth 3 (three) times daily as needed for anxiety. 30 tablet 0  ? meclizine (ANTIVERT) 12.5 MG tablet Take 12.5 mg by mouth daily as needed for dizziness.    ? montel

## 2022-02-16 ENCOUNTER — Encounter: Payer: Self-pay | Admitting: Pulmonary Disease

## 2022-02-18 LAB — LYME DISEASE ABS IGG, IGM, IFA, CSF
Lyme Disease AB (IgG), IBL: NOT DETECTED
Lyme Disease AB (IgM), IBL: NOT DETECTED

## 2022-02-18 LAB — ANGIOTENSIN CONVERTING ENZYME, CSF: ANGIOTENSIN CONVERTING ENZYME ( ACE) CSF: 2 U/L (ref <=15)

## 2022-02-18 LAB — CSF CELL COUNT WITH DIFFERENTIAL
RBC Count, CSF: 0 cells/uL
WBC, CSF: 0 cells/uL (ref 0–5)

## 2022-02-18 LAB — PARANEOPLASTIC AB EXPANDED EVAL W/ RFLX TITER AND LB,CSF
AGNA (SOX1) AB, IFA, CSF: NEGATIVE
AMPAR1 AB, CBA, CSF: NEGATIVE
AMPAR2 AB, CBA, CSF: NEGATIVE
AMPHIPHYSIN AB, IFA, CSF: NEGATIVE
ANNA1 (HU) AB, IFA, CSF: NEGATIVE
ANNA2 (RI) AB, IFA, CSF: NEGATIVE
ANNA3 AB, IFA, CSF: NEGATIVE
AQUAPORIN 4 (AQP4) AB,CBA: NEGATIVE
AQUAPORIN 4 AB, IFA, CSF: NEGATIVE
CASPR2 AB, CBA, CSF: NEGATIVE
CRMP5 (CV2) AB, IFA, CSF: NEGATIVE
DPPX AB, CBA, CSF: NEGATIVE
GABABR AB, CBA, CSF: NEGATIVE
GAD65 AB, IFA, CSF: NEGATIVE
LGI1 AB, CBA, CSF: NEGATIVE
MA2/TA AB, IFA, CSF: NEGATIVE
MYELIN AB, IFA, CSF: NEGATIVE
NMDAR1 AB, CBA, CSF: NEGATIVE
PCA TR (DNER) AB, IFA,CSF: NEGATIVE
PCA1 (YO) AB, IFA, CSF: NEGATIVE
PCA2 AB, IFA, CSF: NEGATIVE
VGKC AB, CSF: <20 pmol/L (ref <20)

## 2022-02-18 LAB — TEST AUTHORIZATION

## 2022-02-18 LAB — CNS IGG SYNTHESIS RATE, CSF+BLOOD
Albumin Serum: 4.9 g/dL (ref 3.6–5.1)
Albumin, CSF: 45 mg/dL — ABNORMAL HIGH (ref 8.0–42.0)
CNS-IgG Synthesis Rate: 0.1 mg/24 h (ref <=3.3)
IgG (Immunoglobin G), Serum: 1060 mg/dL (ref 600–1640)
IgG Total CSF: 5.1 mg/dL (ref 0.8–7.7)
IgG-Index: 0.52 (ref <0.70)

## 2022-02-18 LAB — CSF CULTURE W GRAM STAIN
GRAM STAIN:: NONE SEEN
MICRO NUMBER:: 13195664
Result:: NO GROWTH
SPECIMEN QUALITY:: ADEQUATE

## 2022-02-18 LAB — GLUCOSE, CSF: Glucose, CSF: 68 mg/dL (ref 40–80)

## 2022-02-18 LAB — LEUKEMIA/LYMPHOMA EVALUATION PANEL

## 2022-02-18 LAB — MYELIN BASIC PROTEIN, CSF: Myelin Basic Protein: <2 mcg/L (ref <=4.0)

## 2022-02-18 LAB — OLIGOCLONAL BANDS, CSF + SERM

## 2022-02-18 LAB — PROTEIN, CSF: Total Protein, CSF: 70 mg/dL — ABNORMAL HIGH (ref 15–45)

## 2022-02-19 ENCOUNTER — Ambulatory Visit: Payer: 59 | Admitting: Neurology

## 2022-02-19 ENCOUNTER — Telehealth: Payer: Self-pay | Admitting: Neurology

## 2022-02-19 DIAGNOSIS — F444 Conversion disorder with motor symptom or deficit: Secondary | ICD-10-CM

## 2022-02-19 DIAGNOSIS — R69 Illness, unspecified: Secondary | ICD-10-CM | POA: Diagnosis not present

## 2022-02-19 NOTE — Telephone Encounter (Signed)
error 

## 2022-02-19 NOTE — Telephone Encounter (Signed)
Patient came in for his EEG today. At check-in he mentioned he is having right sided pain around the back.  ? ?He said he is having a kidney test tomorrow and not sure if it was related to that but he wanted Dr. Tomi Likens to be notified in case. ?

## 2022-02-20 ENCOUNTER — Ambulatory Visit (HOSPITAL_COMMUNITY)
Admission: RE | Admit: 2022-02-20 | Discharge: 2022-02-20 | Disposition: A | Discharge status: Home / Self Care | Payer: 59 | Source: Ambulatory Visit | Attending: Urology | Admitting: Urology

## 2022-02-20 DIAGNOSIS — K760 Fatty (change of) liver, not elsewhere classified: Secondary | ICD-10-CM | POA: Diagnosis not present

## 2022-02-20 DIAGNOSIS — R251 Tremor, unspecified: Secondary | ICD-10-CM | POA: Diagnosis not present

## 2022-02-20 DIAGNOSIS — R2689 Other abnormalities of gait and mobility: Secondary | ICD-10-CM | POA: Diagnosis not present

## 2022-02-20 DIAGNOSIS — R42 Dizziness and giddiness: Secondary | ICD-10-CM | POA: Diagnosis not present

## 2022-02-20 DIAGNOSIS — I10 Essential (primary) hypertension: Secondary | ICD-10-CM | POA: Diagnosis not present

## 2022-02-20 DIAGNOSIS — R109 Unspecified abdominal pain: Secondary | ICD-10-CM | POA: Diagnosis not present

## 2022-02-20 DIAGNOSIS — K449 Diaphragmatic hernia without obstruction or gangrene: Secondary | ICD-10-CM | POA: Diagnosis not present

## 2022-02-20 DIAGNOSIS — K5730 Diverticulosis of large intestine without perforation or abscess without bleeding: Secondary | ICD-10-CM | POA: Diagnosis not present

## 2022-02-20 DIAGNOSIS — R519 Headache, unspecified: Secondary | ICD-10-CM | POA: Diagnosis not present

## 2022-02-20 NOTE — Procedures (Signed)
ELECTROENCEPHALOGRAM REPORT ? ?Date of Study: 02/19/2022 ? ?Patient's Name: Elijah Shaffer ?MRN: [id number] ?Date of Birth: February 24, 1981 ? ? ?Clinical History: 41 year old male with multiple symptomatology including dizziness, syncope, neuropathy and right hand shaking. ? ?Medications: ?aspirin EC 81 MG tablet ?b complex vitamins capsule With D3 gummie ?cellbetamethasone dipropionate 0.05 % cream ?ZYRTEC 10 MG tablet ?BENADRYL EXTRA STRENGTH cream ?COLACE 100 MG capsule ?PROZAC 20 MG capsule ?FLONASE ALLERGY RELEIF 50 MCG/ACT nasal spray ?ADVAIR 250-50 MCG/ACT AEPB ?MUCINEX 600 MG 12 hr tablet ?ATARAX 25 MG tablet ?ATARAX/VISTARIL 10 MG tablet ?ANTIVERT 12.5 MG tablet ?SINGULAIR 10 MG tablet ?LOVAZA 1 g capsule ?ZOFRAN 4 MG tablet ?PROTONIX 40 MG tablet ?INDERAL 20 MG tablet ? ?Technical Summary: ?A multichannel digital EEG recording measured by the international 10-20 system with electrodes applied with paste and impedances below 5000 ohms performed in our laboratory with EKG monitoring in an awake and drowsy patient.  Hyperventilation and photic stimulation were performed.  The digital EEG was referentially recorded, reformatted, and digitally filtered in a variety of bipolar and referential montages for optimal display.   ? ?Description: ?The patient is awake and drowsy during the recording.  During maximal wakefulness, there is a symmetric, medium voltage 10-11 Hz posterior dominant rhythm that attenuates with eye opening.  The record is symmetric.  During drowsiness, there is an increase in theta slowing of the background.  Stage 2 sleep not seen.  Hyperventilation and photic stimulation did not elicit any abnormalities.  Patient exhibited ongoing tremor of right hand with no electrographic correlate.  There were no epileptiform discharges or electrographic seizures seen.   ? ?EKG lead was unremarkable. ? ?Impression: ?This awake and drowsy EEG is normal.   ? ? ? ?Metta Clines, DO ? ?

## 2022-02-21 ENCOUNTER — Encounter: Payer: Self-pay | Admitting: Neurology

## 2022-02-21 ENCOUNTER — Telehealth: Payer: Self-pay

## 2022-02-21 DIAGNOSIS — M4004 Postural kyphosis, thoracic region: Secondary | ICD-10-CM | POA: Diagnosis not present

## 2022-02-21 DIAGNOSIS — S134XXA Sprain of ligaments of cervical spine, initial encounter: Secondary | ICD-10-CM | POA: Diagnosis not present

## 2022-02-21 DIAGNOSIS — M7061 Trochanteric bursitis, right hip: Secondary | ICD-10-CM | POA: Diagnosis not present

## 2022-02-21 DIAGNOSIS — S338XXA Sprain of other parts of lumbar spine and pelvis, initial encounter: Secondary | ICD-10-CM | POA: Diagnosis not present

## 2022-02-21 DIAGNOSIS — S233XXA Sprain of ligaments of thoracic spine, initial encounter: Secondary | ICD-10-CM | POA: Diagnosis not present

## 2022-02-21 NOTE — Telephone Encounter (Signed)
I recommend evaluation by his PCP.

## 2022-02-21 NOTE — Telephone Encounter (Signed)
Patient called stating he had testing with his neurologist, but is still experiencing right and left sided pain.  Patient states he is not sure if this pain could be related to his RA and requested a return call.   ?

## 2022-02-21 NOTE — Telephone Encounter (Signed)
Patient states for the last several days he has been having pain in his lower right side into back and then shoots to the left side. Patient states the pain is right above his hip. Patient states he has also started having "itching spells" in the last several. Patient states he has allergy medication and it helped some. Patient states he saw the urologist and was sent for a scan which determined that he did not have kidney stones. Patient states he has been taking Ibuprofen with little relief. He states the pain is a 10 currently. Please advise.  ?

## 2022-02-21 NOTE — Telephone Encounter (Signed)
Attempted to contact and left message to advise patient Dr. Estanislado Pandy recommends evaluation by his PCP. ?

## 2022-02-22 ENCOUNTER — Telehealth: Payer: Self-pay | Admitting: Internal Medicine

## 2022-02-22 NOTE — Telephone Encounter (Signed)
Patient  called in with FYI for upcoming appointment. 4/20 ? ?Patient is having pains in sides across from belly button, pains in back as well. ?Patient seen chiropractor and states they felt some swelling on around lower back and sides. ?May have issues surrounding spine. ?

## 2022-02-22 NOTE — Telephone Encounter (Signed)
Pt called back in to add that he has been itching all over his body and has rashes.  ?

## 2022-02-23 ENCOUNTER — Encounter: Payer: Self-pay | Admitting: Neurology

## 2022-02-26 ENCOUNTER — Encounter: Payer: Self-pay | Admitting: Neurology

## 2022-02-26 ENCOUNTER — Encounter: Payer: Self-pay | Admitting: Infectious Diseases

## 2022-02-26 ENCOUNTER — Ambulatory Visit: Payer: 59 | Attending: Infectious Diseases | Admitting: Infectious Diseases

## 2022-02-26 VITALS — BP 128/95 | HR 54 | Temp 96.6°F | Wt 258.0 lb

## 2022-02-26 DIAGNOSIS — K219 Gastro-esophageal reflux disease without esophagitis: Secondary | ICD-10-CM | POA: Insufficient documentation

## 2022-02-26 DIAGNOSIS — R7401 Elevation of levels of liver transaminase levels: Secondary | ICD-10-CM | POA: Insufficient documentation

## 2022-02-26 DIAGNOSIS — G629 Polyneuropathy, unspecified: Secondary | ICD-10-CM | POA: Insufficient documentation

## 2022-02-26 DIAGNOSIS — B882 Other arthropod infestations: Secondary | ICD-10-CM

## 2022-02-26 DIAGNOSIS — R0602 Shortness of breath: Secondary | ICD-10-CM | POA: Diagnosis not present

## 2022-02-26 DIAGNOSIS — R251 Tremor, unspecified: Secondary | ICD-10-CM | POA: Insufficient documentation

## 2022-02-26 DIAGNOSIS — F419 Anxiety disorder, unspecified: Secondary | ICD-10-CM | POA: Insufficient documentation

## 2022-02-26 DIAGNOSIS — R5383 Other fatigue: Secondary | ICD-10-CM | POA: Diagnosis not present

## 2022-02-26 DIAGNOSIS — R531 Weakness: Secondary | ICD-10-CM | POA: Insufficient documentation

## 2022-02-26 DIAGNOSIS — R69 Illness, unspecified: Secondary | ICD-10-CM | POA: Diagnosis not present

## 2022-02-26 NOTE — Patient Instructions (Addendum)
You are here for questions about tick borne illness- you had abnormal lab ( ehrlichia in Aug 7445 and got treated and repeat lab was normal) your symptoms of tremors, fatigue and fogginess is not due to ehrlichia- your lyme test was normal. I spoke to [facility] your neurologist and he will refer you to another facility. From infectious disease perspective you dont need any further antibiotics ?

## 2022-02-26 NOTE — Progress Notes (Signed)
NAME: Elijah Shaffer  ?DOB: 1981/06/07  ?MRN: [id number]  ?Date/Time: 02/26/2022 11:43 AM ? ? ?Subjective:  ? ?Patient is here for a third infectious disease opinion.  He is referred to me for multitude of symptoms and to rule out any tickborne illness.  He had seen Dr. Linus Salmons  infectious disease specialist at [facility] September 2022 and then he saw Dr. Juleen China at [facility] in Feb 2023. ? 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 ? ?In April 2022 he presented to the ED on 15 April with sudden onset right hand tingling, left-sided facial numbness and left facial droop.  He was admitted as TIA and had carotid Doppler which was normal, 2D echo normal, CT of the brain normal, MR angio of the brain were all normal  ?He then presented to the ED again on 03/05/2021 with sudden feeling of his breathing stopping at nighttime and he became anxious and had palpitations.  He was discharged from the ED.  On 03/17/2021 he was readmitted to the hospital after passing out while landscaping.  He was had recently been treated for heat injury.  But in the ED this at that admission he was vomiting he was diaphoretic lightheaded and dizzy.  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  Patient also had significant hiatus hernia.  He was discharged home on 03/20/2021 on Augmentin for possible aspiration pneumonia.  At that admission there was a mild increase in total bilirubin noted related gone up to 1.7 .  He was followed by GI and had on 06/08/2021 for repeat endoscopy and biopsies were taken from the Barrett's esophagus and it showed intestinal metaplasia with no dysplasia or malignancy identified. ?He then had a few more visits to the ED complaining of headaches, body ache, nausea and vomiting with shortness of breath.  He also had a fever of 101.9 which was on 06/03/2021.  At that visit there was a pink maculopapular  Blanching rash noted on the anterior torso.  He was prescribed amoxicillin.  As he was not Getting better patient went to urgent care and was sent to the hospital on 06/15/2021.   ?Work-up was done for tickborne illness and [facility] spotted fever IgG was negative, Lyme disease serology was negative and ehrlichia IgG was high at > 1:1024. On 06/22/22 he was prescribed Doxycycline for 14 days. HE then saw ID Dr.Comer as OP on 07/24/21 who checked convalescent titers and it was < 1:64. No furtehr treatment was recommended and he was told that his constellation of lingering /chronic symptoms were not due to Ehrlichia. HE was also seeing pulmonary for exertional dyspnea SOB and was given bronchodilators and montelukast for possible OPD. HRCT, PFT normal ? He saw Cardiology in Jan 2023 for chest pain , atypical angina and underwent Rt/Left cardiac cath on 12/12/21 ?Result normal cath with EF 50-55%, Normal coronaries. ?He was referred to rheumatologist  for elevated CK and saw Dr.Deveshwar on 12/17/21. ?Already an extensive workup was done for high CPK on 10/02/21 and were neg except for borderline RNP 1:4 ?:CK 356, Myomarker3 panel negative, aldolase 6, anti-CCP-, CRP<1, Scl-70-, SM ab-, dsDNA-, ANCA negative, ANA-, Ro-, La-, ESR 2, RNP 1.4, RF<14 ? ?On 12/21/21 HE had a follow up with Dr. Tomi Likens neurologist ?He noted hyperreflexia, episode of facial symmetry ( possible bells palsy) and ordered MRI Cervcial spine/ ? NCV-EMG ?HE was  seen in the ED on 12/27/21 for SOB ?RMSF test was sent- IgG was negative and IgM was barely positive at 1.67 ( falso positive ). Was prescribed Doxy for 14 days by his PCP- not clear whether he took it. ? ?He was referred to ID for RMSF and saw Dr.Wallace on 01/08/22 who reassured him rightly that IgM was a false positive test and no need for Doxy ?HE had long term heart monitoring as OP in early feb and it showedshowed predominant underlying rhythm of sinus rhythm, first-degree AV block present,  second-degree AV block Mobitz 1 (Wenckebach),none greater than 3 seconds min HR 24, Max HR 159 bpm, average HR 74 bpm.  ?Sleep study Moderate OSA on 12/12/21 ?Because of tremors in his hands rt > left he was seeing Dr.Jaffe. 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 ?A lumbar puncture was done on 02/06/22 and CSF analysis showed zero WBC and 70 protein which was elevated -albumino cytological dissociation ?Paranepoplastic and autoimmune work up of csf was negative ? ?Meanwhile patient wanted to get another opinion regarding the tick borne illness ? ? ?Past Medical History:  ?Diagnosis Date  ? Acute renal injury ([place]) 06/28/2017  ? Anxiety   ? Aspiration pneumonia of right upper lobe due to gastric secretions ([place])   ? Diverticulitis   ? GERD (gastroesophageal reflux disease) 01/08/2018  ? Seasonal allergies   ? Syncope 06/26/2021  ?  ?Past Surgical History:  ?Procedure Laterality Date  ? BIOPSY  03/18/2021  ? Procedure: BIOPSY;  Surgeon: Montez Morita, Quillian Quince, MD;  [facility];  Service: Gastroenterology;;  esophageal at Z-line  ? BIOPSY  05/23/2021  ? Procedure: BIOPSY;  Surgeon: Harvel Quale, MD;  [facility]: [facility];  Service: Gastroenterology;;  ? COLONOSCOPY N/A 09/22/2017  ? Procedure: COLONOSCOPY;  Surgeon: Rogene Houston, MD;  [facility];  Service: Endoscopy;  Laterality: N/A;  9:55  ? COLONOSCOPY WITH PROPOFOL N/A 05/23/2021  ? Procedure: COLONOSCOPY WITH PROPOFOL;  Surgeon: Harvel Quale, MD;  [facility];  Service: Gastroenterology;  Laterality: N/A;  7:30  ? ESOPHAGOGASTRODUODENOSCOPY (EGD) WITH PROPOFOL N/A 03/18/2021  ? Procedure: ESOPHAGOGASTRODUODENOSCOPY (EGD) WITH PROPOFOL;  Surgeon: Harvel Quale, MD;  [facility];  Service: Gastroenterology;  Laterality: N/A;  ? ESOPHAGOGASTRODUODENOSCOPY  (EGD) WITH PROPOFOL N/A 05/23/2021  ? Procedure: ESOPHAGOGASTRODUODENOSCOPY (EGD) WITH PROPOFOL;  Surgeon: Harvel Quale, MD;  [facility];  Service: Gastroenterology;  Laterality: N/A;  ? POLYPECTOMY  09/22/2017  ? Procedure: POLYPECTOMY;  Surgeon: Rogene Houston, MD;  [facility];  Service: Endoscopy;;  ? RIGHT/LEFT HEART CATH AND CORONARY ANGIOGRAPHY N/A 12/12/2021  ? Procedure: RIGHT/LEFT HEART CATH AND CORONARY ANGIOGRAPHY;  Surgeon: Jolaine Artist, MD;  Location: [facility];  Service: Cardiovascular;  Laterality: N/A;  ? TRANSTHORACIC ECHOCARDIOGRAM  02/24/2021  ? EF 60 to 65%.  Normal LV function.  Normal valves.  Mild RV dilation with normal function.  ?  ?Social History  ? ?Socioeconomic History  ? Marital status: Single  ?  Spouse name: Not on file  ? Number of children: Not on file  ? Years of education: Not on file  ? Highest education level: Not on file  ?Occupational History  ? Not on file  ?Tobacco Use  ? Smoking status: Never  ?  Passive exposure: Past  ?  Smokeless tobacco: Never  ?Vaping Use  ? Vaping Use: Never used  ?Substance and Sexual Activity  ? Alcohol use: Yes  ?  Comment: occ  ? Drug use: No  ? Sexual activity: Not on file  ?Other Topics Concern  ? Not on file  ?Social History Narrative  ? Not on file  ? ?Social Determinants of Health  ? ?Financial Resource Strain: Not on file  ?Food Insecurity: Not on file  ?Transportation Needs: Not on file  ?Physical Activity: Not on file  ?Stress: Stress Concern Present  ? Feeling of Stress : Rather much  ?Social Connections: Not on file  ?Intimate Partner Violence: Not on file  ?  ?Family History  ?Problem Relation Age of Onset  ? Healthy Mother   ? Cancer Father   ? Colon cancer Maternal Grandmother   ? Colon cancer Maternal Grandfather   ? ?Allergies  ?Allergen Reactions  ? Claritin [Loratadine] Other (See Comments)  ?  Heart race  ? Loratadine-Pseudoephedrine Er Palpitations  ? Pollen Extract Other  (See Comments)  ?  Seasonal allergies  ? ?I? ?Current Outpatient Medications  ?Medication Sig Dispense Refill  ? aspirin EC 81 MG tablet Take 81 mg by mouth daily. Swallow whole.    ? b complex vitamins capsu

## 2022-02-27 ENCOUNTER — Other Ambulatory Visit: Payer: Self-pay

## 2022-02-27 ENCOUNTER — Telehealth: Payer: Self-pay | Admitting: Neurology

## 2022-02-27 DIAGNOSIS — S134XXA Sprain of ligaments of cervical spine, initial encounter: Secondary | ICD-10-CM | POA: Diagnosis not present

## 2022-02-27 DIAGNOSIS — M7061 Trochanteric bursitis, right hip: Secondary | ICD-10-CM | POA: Diagnosis not present

## 2022-02-27 DIAGNOSIS — S338XXA Sprain of other parts of lumbar spine and pelvis, initial encounter: Secondary | ICD-10-CM | POA: Diagnosis not present

## 2022-02-27 DIAGNOSIS — S233XXA Sprain of ligaments of thoracic spine, initial encounter: Secondary | ICD-10-CM | POA: Diagnosis not present

## 2022-02-27 DIAGNOSIS — M4004 Postural kyphosis, thoracic region: Secondary | ICD-10-CM | POA: Diagnosis not present

## 2022-02-27 DIAGNOSIS — R251 Tremor, unspecified: Secondary | ICD-10-CM

## 2022-02-27 NOTE — Telephone Encounter (Signed)
Patient called and said he is hurting very bad. Some times he is a 10 in pain. He has an appt with his PCP soon, but in the mean time what is he to do. He said something is causing this pain. He would like a call back. He has had several test and they were negative. ?

## 2022-02-27 NOTE — Telephone Encounter (Signed)
Patient called stating he was returning [facility] call.  ? ?He [place] and wants to be sure it is okay to travel. ?

## 2022-02-27 NOTE — Progress Notes (Unsigned)
Per DR.Jaffe, would like to added, copper and ceruloplasmin.  ?

## 2022-02-28 ENCOUNTER — Other Ambulatory Visit (INDEPENDENT_AMBULATORY_CARE_PROVIDER_SITE_OTHER): Payer: 59

## 2022-02-28 ENCOUNTER — Ambulatory Visit: Payer: 59 | Admitting: Internal Medicine

## 2022-02-28 DIAGNOSIS — R251 Tremor, unspecified: Secondary | ICD-10-CM | POA: Diagnosis not present

## 2022-03-01 LAB — CERULOPLASMIN: Ceruloplasmin: 20 mg/dL (ref 18–36)

## 2022-03-04 DIAGNOSIS — S134XXA Sprain of ligaments of cervical spine, initial encounter: Secondary | ICD-10-CM | POA: Diagnosis not present

## 2022-03-04 DIAGNOSIS — S233XXA Sprain of ligaments of thoracic spine, initial encounter: Secondary | ICD-10-CM | POA: Diagnosis not present

## 2022-03-04 DIAGNOSIS — S338XXA Sprain of other parts of lumbar spine and pelvis, initial encounter: Secondary | ICD-10-CM | POA: Diagnosis not present

## 2022-03-04 DIAGNOSIS — M7061 Trochanteric bursitis, right hip: Secondary | ICD-10-CM | POA: Diagnosis not present

## 2022-03-04 DIAGNOSIS — M4004 Postural kyphosis, thoracic region: Secondary | ICD-10-CM | POA: Diagnosis not present

## 2022-03-05 ENCOUNTER — Telehealth (INDEPENDENT_AMBULATORY_CARE_PROVIDER_SITE_OTHER): Payer: Self-pay

## 2022-03-05 ENCOUNTER — Ambulatory Visit: Payer: 59 | Admitting: Internal Medicine

## 2022-03-05 DIAGNOSIS — R142 Eructation: Secondary | ICD-10-CM | POA: Diagnosis not present

## 2022-03-05 DIAGNOSIS — R12 Heartburn: Secondary | ICD-10-CM | POA: Diagnosis not present

## 2022-03-05 DIAGNOSIS — K449 Diaphragmatic hernia without obstruction or gangrene: Secondary | ICD-10-CM | POA: Diagnosis not present

## 2022-03-05 DIAGNOSIS — K219 Gastro-esophageal reflux disease without esophagitis: Secondary | ICD-10-CM | POA: Diagnosis not present

## 2022-03-05 LAB — COPPER, SERUM: Copper: 73 ug/dL (ref 70–175)

## 2022-03-05 NOTE — Telephone Encounter (Signed)
It would be important to see him in the office if possible. He can try taking IBGard as needed for the bloating. Can we try to get his an earlier appointment? ? ?Ann, do you know if they perform Bravo pH testing at [facility] or [facility]? May be the other way to do the GERD testing if he cannot have the manometry. ?

## 2022-03-05 NOTE — Telephone Encounter (Signed)
Patient called today states he preformed what he says he thinks was an Esophageal manometry test today,but they could not go very far due to his hernia. ? ?He wants to mention that he has been having abdominal swelling and tightness in the abdominal area for the last two weeks. He would like to know what he can do about this.  ? ?He denies any nausea, vomiting, constipation,diarrhea, dark or bloody stools,fevers. He says he is now taking a stool softener daily to help with constipation issue.  ? ?Please advise.  ?

## 2022-03-06 ENCOUNTER — Ambulatory Visit: Payer: 59 | Admitting: Internal Medicine

## 2022-03-06 NOTE — Telephone Encounter (Signed)
Patient aware he may take Ibgard, and aware to be here on 03/07/2022 for an appointment with Dr. Jenetta Downer. ?

## 2022-03-06 NOTE — Telephone Encounter (Signed)
Thanks, we will check if [place] does Bravo, if not will need to refer to [facility] ?

## 2022-03-06 NOTE — Telephone Encounter (Signed)
Ok thanks, do no send the referral yet, I'll discuss with him tomorrow ?Thanks ?

## 2022-03-07 ENCOUNTER — Encounter (INDEPENDENT_AMBULATORY_CARE_PROVIDER_SITE_OTHER): Payer: Self-pay | Admitting: Gastroenterology

## 2022-03-07 ENCOUNTER — Ambulatory Visit (INDEPENDENT_AMBULATORY_CARE_PROVIDER_SITE_OTHER): Payer: 59 | Admitting: Nurse Practitioner

## 2022-03-07 ENCOUNTER — Encounter: Payer: Self-pay | Admitting: Nurse Practitioner

## 2022-03-07 ENCOUNTER — Ambulatory Visit (INDEPENDENT_AMBULATORY_CARE_PROVIDER_SITE_OTHER): Payer: 59 | Admitting: Gastroenterology

## 2022-03-07 VITALS — BP 134/89 | HR 66 | Ht 75.0 in | Wt 261.0 lb

## 2022-03-07 DIAGNOSIS — R140 Abdominal distension (gaseous): Secondary | ICD-10-CM

## 2022-03-07 DIAGNOSIS — L299 Pruritus, unspecified: Secondary | ICD-10-CM

## 2022-03-07 DIAGNOSIS — L309 Dermatitis, unspecified: Secondary | ICD-10-CM

## 2022-03-07 DIAGNOSIS — M546 Pain in thoracic spine: Secondary | ICD-10-CM

## 2022-03-07 DIAGNOSIS — L298 Other pruritus: Secondary | ICD-10-CM | POA: Diagnosis not present

## 2022-03-07 DIAGNOSIS — R7989 Other specified abnormal findings of blood chemistry: Secondary | ICD-10-CM | POA: Insufficient documentation

## 2022-03-07 DIAGNOSIS — L2989 Other pruritus: Secondary | ICD-10-CM

## 2022-03-07 MED ORDER — KETOCONAZOLE 2 % EX SHAM: 1.0000 "application " | MEDICATED_SHAMPOO | CUTANEOUS | 0 refills | Status: DC

## 2022-03-07 MED ORDER — HYDROXYZINE PAMOATE 25 MG PO CAPS
25.0000 mg | ORAL_CAPSULE | Freq: Three times a day (TID) | ORAL | 0 refills | Status: DC | PRN
Start: 2022-03-07 — End: 2022-03-21

## 2022-03-07 MED ORDER — TRIAMCINOLONE ACETONIDE 0.1 % EX CREA: 1.0000 "application " | TOPICAL_CREAM | Freq: Two times a day (BID) | CUTANEOUS | 0 refills | Status: DC

## 2022-03-07 MED ORDER — CYCLOBENZAPRINE HCL 5 MG PO TABS
5.0000 mg | ORAL_TABLET | Freq: Three times a day (TID) | ORAL | 1 refills | Status: DC | PRN
Start: 2022-03-07 — End: 2022-05-29

## 2022-03-07 NOTE — Progress Notes (Signed)
Maylon Peppers, M.D. ?Gastroenterology & Hepatology ?[facility] ?[address] ?[place], [place] [address] ? ?Primary Care Physician: ?Lindell Spar, MD ?[address] ?[place] [address] ? ?I will communicate my assessment and recommendations to the referring MD via EMR. ? 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 ?History of Present Illness: ?Elijah Shaffer is a 41 y.o. male with history of GERD complicated by long segment nondysplastic Barrett's esophagus, history of diverticulitis and anxiety, GERD, large hiatal hernia and recent diagnosis of polyneuropathy of unclear etiology (currently undergoing multidisciplinary evaluation) who presents for evaluation of abdominal distention. ? ?The patient was last seen on 11/22/2021. At that time, the patient was continued on omeprazole 40 mg every 12 hours and was advised to practice diaphragmatic breathing.  He was also advised to start taking MiraLAX.  The patient was presenting persistent belching episodes and symptoms that were concerning for ongoing GERD, please schedule him for a pH impedance testing and esophageal manometry.  Patient went a couple days ago to [facility] to undergo testing but he states that he believes the manometry may not be accurate as he was told the probe could not be advanced to his stomach. However, the ph probe was advanced down to the esophagus and he had monitoring for 24 hours. ? ?Patient reports that for the last 2-3 weeks he has noticed worsening abdominal distention and tightness sensation in his abdomen. Has not changed his diet at all. The patient denies having any nausea, vomiting, fever, chills, hematochezia, melena, hematemesis, abdominal pain, diarrhea, jaundice, pruritus or weight changes. He reports that he was having more constipation while taking Miralax. He reports that  he went back to the stool softeners which led to 3 bowel movements every day. He is currently having a bowel movement two times a day - states it is a large amount. ? ?Interestingly, the reports that a couple of weeks ago he noticed recurrent episodes of "shaking of his extremities" and neck. 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 He is currently undergoing evaluation by infectious disease (due to previous tick bites), neurology, cardiology, rheumatology. Has taken doxycycline 3 times in the past without any improvement. Has been seen by pulmonology who have ruled out a lung disease as the cause of his SOB.Undergoing evaluation by neurology - he was referred to  [facility] pending evaluation of neuropathy. ? ?Had normal ceruloplasmin and copper recently. No iron stores are available. Had elevated LFTs on 11/22/2021 as his AST was 45, ALT was 75, alkaline phosphatase was 81, total bili was 0.5.  Repeat LFTs on 01/31/2022 showed improvement of his aminotransferases with ALT of 52 and AST of 29. ? ?He still has significant belching and burping episodes. Does not feel heartburn or dysphagia. ? ?Last EGD: 05/2021 ?- Esophageal mucosal changes consistent with long-segment Barrett's esophagus, classified as Barrett's stage C5-M6 per Prague criteria (between 30-36 cm). Biopsied - positive for BE but neg for dysplasia. ?- 9 cm hiatal hernia. ?- Normal stomach. ?- Normal examined duodenum. ?  ?Recommended to repeat in 3 years. ?  ?Last Colonoscopy: 05/2021 -  ?The perianal and digital rectal examinations were normal. ?Multiple large-mouthed diverticula were found in the sigmoid colon, transverse colon, ascending colon and cecum. ?Non-bleeding internal hemorrhoids were found during  retroflexion. The  hemorrhoids were small. ?  ?Recommended to repeat in 5  years due to family history of cancer. ? ?Past Medical History: ?Past Medical History:  ?Diagnosis Date  ? Acute renal injury ([place]) 06/28/2017  ? Anxiety   ? Aspiration pneumonia of right upper lobe due to gastric secretions ([place])   ? Diverticulitis   ? GERD (gastroesophageal reflux disease) 01/08/2018  ? Seasonal allergies   ? Syncope 06/26/2021  ? ? ?Past Surgical History: ?Past Surgical History:  ?Procedure Laterality Date  ? BIOPSY  03/18/2021  ? Procedure: BIOPSY;  Surgeon: Montez Morita, Quillian Quince, MD;  [facility];  Service: Gastroenterology;;  esophageal at [facility]  ? BIOPSY  05/23/2021  ? Procedure: BIOPSY;  Surgeon: Harvel Quale, MD;  [facility];  Service: Gastroenterology;;  ? COLONOSCOPY N/A 09/22/2017  ? Procedure: COLONOSCOPY;  Surgeon: Rogene Houston, MD;  [facility];  Service: Endoscopy;  Laterality: N/A;  9:55  ? COLONOSCOPY WITH PROPOFOL N/A 05/23/2021  ? Procedure: COLONOSCOPY WITH PROPOFOL;  Surgeon: Harvel Quale, MD;  [facility];  Service: Gastroenterology;  Laterality: N/A;  7:30  ? ESOPHAGOGASTRODUODENOSCOPY (EGD) WITH PROPOFOL N/A 03/18/2021  ? Procedure: ESOPHAGOGASTRODUODENOSCOPY (EGD) WITH PROPOFOL;  Surgeon: Harvel Quale, MD;  [facility];  Service: Gastroenterology;  Laterality: N/A;  ? ESOPHAGOGASTRODUODENOSCOPY (EGD) WITH PROPOFOL N/A 05/23/2021  ? Procedure: ESOPHAGOGASTRODUODENOSCOPY (EGD) WITH PROPOFOL;  Surgeon: Harvel Quale, MD;  [facility];  Service: Gastroenterology;  Laterality: N/A;  ? POLYPECTOMY  09/22/2017  ? Procedure: POLYPECTOMY;  Surgeon: Rogene Houston, MD;  [facility];  Service: Endoscopy;;  ? RIGHT/LEFT HEART CATH AND CORONARY ANGIOGRAPHY N/A 12/12/2021  ? Procedure: RIGHT/LEFT HEART CATH AND CORONARY ANGIOGRAPHY;  Surgeon: Jolaine Artist, MD;  Location: [facility];  Service: Cardiovascular;  Laterality: N/A;  ?  TRANSTHORACIC ECHOCARDIOGRAM  02/24/2021  ? EF 60 to 65%.  Normal LV function.  Normal valves.  Mild RV dilation with normal function.  ? ? ?Family History: ?Family History  ?Problem Relation Age of Onset  ? Healthy Mother   ? Cancer Father   ? Colon cancer Maternal Grandmother   ? Colon cancer Maternal Grandfather   ? ? ?Social History: ?Social History  ? ?Tobacco Use  ?Smoking Status Never  ? Passive exposure: Past  ?Smokeless Tobacco Never  ? ?Social History  ? ?Substance and Sexual Activity  ?Alcohol Use Yes  ? Comment: occ  ? ?Social History  ? ?Substance and Sexual Activity  ?Drug Use No  ? ? ?Allergies: ?Allergies  ?Allergen Reactions  ? Claritin [Loratadine] Other (See Comments)  ?  Heart race  ? Loratadine-Pseudoephedrine Er Palpitations  ? Pollen Extract Other (See Comments)  ?  Seasonal allergies  ? ? ?Medications: ?Current Outpatient Medications  ?Medication Sig Dispense Refill  ? aspirin EC 81 MG tablet Take 81 mg by mouth daily. Swallow whole.    ? b complex vitamins capsule Take 1 capsule by mouth daily. With D3 ?gummie    ? betamethasone dipropionate 0.05 % cream Apply 1 application topically 2 (two) times daily as needed (Rash).    ? cetirizine (ZYRTEC) 10 MG tablet Take 10 mg by mouth daily.    ? clonazePAM (KLONOPIN) 0.5 MG tablet Take 0.5 mg by mouth daily as needed.    ? cyclobenzaprine (FLEXERIL) 5 MG tablet Take 1 tablet (5 mg total) by mouth 3 (three) times daily as needed for  muscle spasms. 30 tablet 1  ? diphenhydrAMINE-zinc acetate (BENADRYL EXTRA STRENGTH) cream Apply 1 application topically 3 (three) times daily as needed for itching. 28.4 g 0  ? docusate sodium (COLACE) 100 MG capsule Take 100 mg by mouth 2 (two) times daily.    ? FLUoxetine (PROZAC) 20 MG capsule Take 1 capsule (20 mg total) by mouth daily. 90 capsule 1  ? fluticasone (FLONASE ALLERGY RELIEF) 50 MCG/ACT nasal spray Place 2 sprays into both nostrils daily as needed for allergies or rhinitis. (Patient taking differently:  Place 2 sprays into both nostrils daily.) 9.9 mL 2  ? guaiFENesin (MUCINEX) 600 MG 12 hr tablet Take 600 mg by mouth 2 (two) times daily as needed for to loosen phlegm.    ? hydrOXYzine (VISTARIL) 25 MG cap

## 2022-03-07 NOTE — Progress Notes (Addendum)
? ?  Elijah Shaffer     MRN: [id number]      DOB: 11-15-1980 ? ? ?HPI ?Elijah Shaffer with past medical history of eczema, focal neurologic deficit, GERD, chronic sinusitis, recommended spotted fever is here for complaints of generalized itching  rashes since the past 3 weeks.  The patient denies recent changes to his skin care products , or recent exposure to any known allergens ,he was previously seeing a dermatologist but due to insurance reasons he has not been able to see them. He has ran out of kenalog cream and would like a refil Patient denies fever chills, nausea or vomiting ? ? ?Pt c/o mid back pain that started 2 weeks ago, has intermittent shooting pain from left to right  he saw a chiropractor , he was told that he had problems with his spine, he sent a message to his neurology and they told him to see his PCP. Pain is worse with walking and laying down, pain is 10/10 when it happens, lasting for about 10-20 minutes. Pt denies fever, chills,dysuria, bloody urine, incontinence. He was seen by urology 3 weeks ago. CT showed no evidence of uropathy. He has been taking tylenol as needed.  ? ? ? ?ROS ?Denies recent fever or chills. ?Denies sinus pressure, nasal congestion, ear pain or sore throat. ?Denies chest congestion, productive cough or wheezing. ?Denies chest pains, palpitations and leg swelling ?Denies abdominal pain, nausea, vomiting,diarrhea or constipation.   ?Denies dysuria, frequency, hesitancy or incontinence. ?Denies headaches, seizures, numbness, or tingling. ?Denies depression, anxiety or insomnia. ? ? ? ?PE ? ?BP 134/89 (BP Location: Left Arm, Patient Position: Sitting, Cuff Size: Large)   Pulse 66   Ht '6\' 3"'$  (1.905 m)   Wt 261 lb (118.4 kg)   SpO2 95%   BMI 32.62 kg/m?  ? ?Patient alert and oriented and in no cardiopulmonary distress. ? ?Chest: Clear to auscultation bilaterally. ? ?CVS: S1, S2 no murmurs, no S3.Regular rate. ? ?ABD: Soft non tender.  ? ?Ext: No edema ? ?MS: Adequate ROM  spine, shoulders, hips and knees.no swelling,no tenderness on palpation of low back, no CVA  tenderness bilaterally. ? ?Skin: erythematous non fluid filled rashes on the anterior chest, abdomen, back and scalp, skin appears dry. No drainage noted  ? ?Psych: Good eye contact, normal affect. Memory intact not anxious or depressed appearing. ? ?CNS:  intact, power,  normal throughout.no focal deficits noted, tremors noted ? ? ?Assessment & Plan ?Acute left-sided thoracic back pain ?Recently seen by urology, CT showed no uropathy ?I will treat for musculoskeletal pain ?Flexeril 5 mg 3 times daily as needed for pain, continue Tylenol 650 mg every 6 hours as needed. ?Stretching exercises encouraged. ? ?Itching ?Skin appears dry.  Patient encouraged to get cerave moisturizing lotion at his pharmacy, avoid scented skin products ?Take hydroxyzine 25 mg 3 times daily as needed itching ?Referral to dermatology today ? ?Pruritic dermatosis of scalp ?Pruritic rashes noted on the scalp ?Use ketoconazole shampoo 3 Times Weekly ?Follow-up with dermatology ? ?Eczema ?Kenalog cream refilled ?Use CeraVe moisturizing skin lotion ?Follow-up with dermatology.  ? ?

## 2022-03-07 NOTE — Assessment & Plan Note (Signed)
Recently seen by urology, CT showed no uropathy ?I will treat for musculoskeletal pain ?Flexeril 5 mg 3 times daily as needed for pain, continue Tylenol 650 mg every 6 hours as needed. ?Stretching exercises encouraged. ?

## 2022-03-07 NOTE — Patient Instructions (Addendum)
Schedule abdominal xray ?Perform blood workup ?Patient was counseled about the benefit of implementing a low FODMAP to improve symptoms and recurrent episodes. A dietary list was provided to the patient. ?Start IBGard 1 tablet every 8-12 hours as needed ? ?

## 2022-03-07 NOTE — Patient Instructions (Addendum)
Please get ceravil Mositurizing cream at your pharmacy for your skin and use daily for your skin.  ? ?Take hydroxyzine '25mg'$  every 8 hours as needed for itching.  ? ? ?Pleas use ketoconazole shampoo. Five to 10 mL of shampoo should be left on for three to five minutes before rinsing off, shampoos should be used two to three times per week for two to four weeks  ? ?It is important that you exercise regularly at least 30 minutes 5 times a week.  ?Think about what you will eat, plan ahead. ?Choose " clean, green, fresh or frozen" over canned, processed or packaged foods which are more sugary, salty and fatty. ?70 to 75% of food eaten should be vegetables and fruit. ?Three meals at set times with snacks allowed between meals, but they must be fruit or vegetables. ?Aim to eat over a 12 hour period , example 7 am to 7 pm, and STOP after  your last meal of the day. ?Drink water,generally about 64 ounces per day, no other drink is as healthy. Fruit juice is best enjoyed in a healthy way, by EATING the fruit. ? ?Thanks for choosing [facility], we consider it a privelige to serve you.  ? ? ? ?

## 2022-03-07 NOTE — Assessment & Plan Note (Addendum)
Skin appears dry.  Patient encouraged to get cerave moisturizing lotion at his pharmacy, avoid scented skin products ?Take hydroxyzine 25 mg 3 times daily as needed itching ?Referral to dermatology today ?

## 2022-03-07 NOTE — Assessment & Plan Note (Signed)
Pruritic rashes noted on the scalp ?Use ketoconazole shampoo 3 Times Weekly ?Follow-up with dermatology ?

## 2022-03-07 NOTE — Assessment & Plan Note (Signed)
Kenalog cream refilled ?Use CeraVe moisturizing skin lotion ?Follow-up with dermatology. ?

## 2022-03-11 DIAGNOSIS — S338XXA Sprain of other parts of lumbar spine and pelvis, initial encounter: Secondary | ICD-10-CM | POA: Diagnosis not present

## 2022-03-11 DIAGNOSIS — M7061 Trochanteric bursitis, right hip: Secondary | ICD-10-CM | POA: Diagnosis not present

## 2022-03-11 DIAGNOSIS — S233XXA Sprain of ligaments of thoracic spine, initial encounter: Secondary | ICD-10-CM | POA: Diagnosis not present

## 2022-03-11 DIAGNOSIS — S134XXA Sprain of ligaments of cervical spine, initial encounter: Secondary | ICD-10-CM | POA: Diagnosis not present

## 2022-03-11 DIAGNOSIS — M4004 Postural kyphosis, thoracic region: Secondary | ICD-10-CM | POA: Diagnosis not present

## 2022-03-12 ENCOUNTER — Ambulatory Visit (HOSPITAL_COMMUNITY): Payer: 59

## 2022-03-12 NOTE — Progress Notes (Signed)
? ?NEUROLOGY FOLLOW UP OFFICE NOTE ? ?Elijah Shaffer ?[id number] ? ?Assessment/Plan:  ? 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  This may be a cause for numbness and tingling but unusual in absence of muscle weakness.  However it would not explain most of his symptoms. ?Hyperreflexia - MRI of spinal cord negative.  GAD abs negative.  Likely physiologic related to anxiety. ?Dizziness/gait instability.  No subsequent syncope.  ?Myalgias, dyspnea - unclear etiology ?Tremor - appears psychogenic.   ?Multiple systemic symptoms - headache, fatigue, chills - ID ruled out RMSF as cause. ?Episode of facial asymmetry - given his age and lack of risk factors, I doubt TIA.  Reported right sided numbness but the only thing objectively noted on exam was possible facial droop (only noted when he smiled) which is suggestive of a functional etiology.   ?  ? ?My suspicion is that the majority of his symptoms are psychosomatic.  The tremor appears consistent with psychogenic tremor.    The "facial droop" that brought him to the hospital seems functional based on the description.  He is hyperreflexic without any objective pathologic etiology and this may be physiologic for and related to his anxiety.  Autoimmune encephalopathy/paraneoplastic panel was negative.  He is hyperreflexic without any objective pathologic etiology and this may be physiologic for him in light of his anxiety.  The only objective positive finding is evidence of an underlying neuropathy such as CIDP as NCV-EMG revealed a polyradiculoneuropathy and CSF analysis revealed an elevated protein.  However, this would typically be accompanied by weakness which he does not exhibit.  In regards to the finding of neuropathy, I will refer to [facility] for further evaluation. ? ?Total time spent with patient and reviewing chart:  38 minutes ?  ?Metta Clines, DO ?  ?  ?Subjective:  ?Elijah Shaffer is a 41 year  old left-handed male who follows up to review the completed workup for his symptoms, including neuropathy, tremor and dizziness. ?  ? ?In April 2022, he was admitted to [facility] for left sided facial droop, right sided numbness and abdominal pain.  Did not receive t-PA due to mild symptoms (NIHSS score of 1 due to mild facial palsy).  As per tele-stroke neurologist's note, he did not exhibit facial asymmetry when talking or at rest, only when smiling.  CT head negative for acute findings.  MRI of brain negative for stroke.  MRA of head negative for LVO or stenosis.  Carotid ultrasound negative for stenosis.  Echocardiogram showed EF 60-65% with no cardiac source of embolus.  LDL was 94, Hgb A1c 5.4, HIV negative, Sars Coronovirus 2 negative, UDS positive only for benzo.  It was decided that patient likely had a Bell's palsy vs TIA.  He was discharged on acyclovir and was supposed to be discharged on ASA and Plavix but appears it was never prescribed.  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  He began not feeling well including headache, chills and fever.  In May, he was outside when he had a recurrence of this and then passed out.  He was told that he was shaking on the ground and was difficult to arouse when EMS arrived.  He vomited and had coffee-ground emesis.  He was admitted to the hospital where he was diagnosed with upper GI bleed and aspiration pneumonia.  He was discharged on PPI  and antibiotics.  Since then, he has been to the ED on several occasions for headache, fatigue and fever and chills.  He was diagnosed with viral illness,  acute pharyngitis and dehydration.  One of the labs from the ED in August was positive for ehrlichosis and he was prescribed doxycycline.  He continued to have systemic symptoms such as cough and was prescribed another round of doxycycline.  He has an appointment with ID.  MRI of brain  on 07/19/2021 was normal.  For shortness of breath, he was evaluated by pulmonology with normal work up including high-resolution CT chest and PFTs.  He was then evaluated by cardiology who diagnosed pulmonary hypertension.  He was evaluated by GI and diagnosed with Barrett's esophagus, hiatal hernia and colon polyps. In November, he was found to have an elevated CK of 356.  Other labs include negative ANA with negative ENA panel, negative anti-CCP, ESR 2, CRP <1, negative RF, aldolase 6, negative Myomarker3 panel,, RNP 1.4 He reports some nocturnal cramps in his calves and sometimes involving the entire leg.  No rash or weakness.  No family history of autoimmune disease.  He was referred to rheumatology.  Repeat CK on 12/17/2021 was 105.  Rheumatologic workup was negative.  Seen in ED on 12/27/2021 for SOB.  Serum Lyme, BNP, d-dimer, and troponin and RMSF IgG were negative but RMSF IgM was borderline elevated at 1.67.  Followed up with ID who believed that the RMSF IgM was false positive.  MRI of cervical spine with and without contrast on 01/08/2022 personally reviewed revealed spurring and disc protrusion at C5-6 causing moderate right foraminal encroachment and slight cord flattening on the right but no significant canal stenosis or evidence of myelopathy.  Seen in ED on 01/31/2022 for full body tremors.  As documented by the ED provider, patient exhibited several episodes of seizure-like activity with full body shaking lasting 30 seconds with immediate return to baseline with no postictal confusion, tongue biting or incontinence.  Did not appear to be epileptiform.  Since then, he has had a persistent tremor in his right hand.  When he uses his left hand, it will also start to shake.  He is now fatigued all of the time.  NCV-EMG of right upper and lower extremities on 01/24/2022 demonstrated length independent sensorimotor polyneuropathy with demyelinating and axonal features but sural sparing suggests possible  polyradiculopathy as well.  Underwent LP on 02/06/2022 for CSF analysis which revealed cell count 0, elevated protein 70, glucose 68, no oligoclonal bands, IgG synthesis rate/index 0.1/0.52, myelin basic protein negative, Lyme IgG/IgM ab negative, GS and culture negative, cytology negative, ACE 2, paraneoplastic antibody expanded evaluation negative.  GAD antibodies on 02/12/2022 was negative.  Copper and ceruloplasmin levels on 02/28/2022 were normal at 73 and 20 respectively.  EEG on 02/19/2022 was normal.   ? ?PAST MEDICAL HISTORY: ?Past Medical History:  ?Diagnosis Date  ? Acute renal injury ([place]) 06/28/2017  ? Anxiety   ? Aspiration pneumonia of right upper lobe due to gastric secretions ([place])   ? Diverticulitis   ? GERD (gastroesophageal reflux disease) 01/08/2018  ? Seasonal allergies   ? Syncope 06/26/2021  ? ? ?MEDICATIONS: ?Current Outpatient Medications on File Prior to Visit  ?Medication Sig Dispense Refill  ? aspirin EC 81 MG tablet Take 81 mg by mouth daily. Swallow whole.    ? b complex vitamins capsule Take 1 capsule by mouth daily. With D3 ?gummie    ? betamethasone dipropionate 0.05 % cream Apply  1 application topically 2 (two) times daily as needed (Rash).    ? cetirizine (ZYRTEC) 10 MG tablet Take 10 mg by mouth daily.    ? clonazePAM (KLONOPIN) 0.5 MG tablet Take 0.5 mg by mouth daily as needed.    ? cyclobenzaprine (FLEXERIL) 5 MG tablet Take 1 tablet (5 mg total) by mouth 3 (three) times daily as needed for muscle spasms. 30 tablet 1  ? diphenhydrAMINE-zinc acetate (BENADRYL EXTRA STRENGTH) cream Apply 1 application topically 3 (three) times daily as needed for itching. 28.4 g 0  ? docusate sodium (COLACE) 100 MG capsule Take 100 mg by mouth 2 (two) times daily.    ? FLUoxetine (PROZAC) 20 MG capsule Take 1 capsule (20 mg total) by mouth daily. 90 capsule 1  ? fluticasone (FLONASE ALLERGY RELIEF) 50 MCG/ACT nasal spray Place 2 sprays into both nostrils daily as needed for allergies or rhinitis.  (Patient taking differently: Place 2 sprays into both nostrils daily.) 9.9 mL 2  ? guaiFENesin (MUCINEX) 600 MG 12 hr tablet Take 600 mg by mouth 2 (two) times daily as needed for to loosen phlegm.    ? hydrOXYzine (

## 2022-03-13 ENCOUNTER — Ambulatory Visit: Payer: 59 | Admitting: Neurology

## 2022-03-13 ENCOUNTER — Encounter: Payer: Self-pay | Admitting: Neurology

## 2022-03-13 VITALS — BP 138/88 | HR 64 | Ht 75.0 in | Wt 263.0 lb

## 2022-03-13 DIAGNOSIS — G610 Guillain-Barre syndrome: Secondary | ICD-10-CM

## 2022-03-13 DIAGNOSIS — F444 Conversion disorder with motor symptom or deficit: Secondary | ICD-10-CM

## 2022-03-13 DIAGNOSIS — R69 Illness, unspecified: Secondary | ICD-10-CM | POA: Diagnosis not present

## 2022-03-13 LAB — HEPATITIS PANEL, ACUTE
Hep A IgM: NONREACTIVE
Hep B C IgM: NONREACTIVE
Hepatitis B Surface Ag: NONREACTIVE
Hepatitis C Ab: NONREACTIVE
SIGNAL TO CUT-OFF: 0.17 (ref <1.00)

## 2022-03-13 LAB — FERRITIN: Ferritin: 133 ng/mL (ref 38–380)

## 2022-03-13 LAB — CELIAC DISEASE PANEL
(tTG) Ab, IgA: <1 U/mL
(tTG) Ab, IgG: <1 U/mL
Gliadin IgA: <1 U/mL
Gliadin IgG: <1 U/mL
Immunoglobulin A: 167 mg/dL (ref 47–310)

## 2022-03-13 LAB — ANA: Anti Nuclear Antibody (ANA): NEGATIVE

## 2022-03-13 LAB — ANTI-SMOOTH MUSCLE ANTIBODY, IGG: Actin (Smooth Muscle) Antibody (IGG): <20 U (ref <20)

## 2022-03-13 LAB — IRON, TOTAL/TOTAL IRON BINDING CAP
%SAT: 33 % (calc) (ref 20–48)
Iron: 124 ug/dL (ref 50–180)
TIBC: 371 mcg/dL (calc) (ref 250–425)

## 2022-03-13 LAB — IGG: IgG (Immunoglobin G), Serum: 959 mg/dL (ref 600–1640)

## 2022-03-13 NOTE — Patient Instructions (Signed)
The only positive test is evidence of a neuropathy.  However, that would only explain the numbness.  The tremor is related to anxiety.  I would like to refer you to [facility] neurology for evaluation and evaluation of the neuropathy in particular.   ?

## 2022-03-14 ENCOUNTER — Ambulatory Visit (HOSPITAL_COMMUNITY)
Admission: RE | Admit: 2022-03-14 | Discharge: 2022-03-14 | Disposition: A | Discharge status: Home / Self Care | Payer: 59 | Source: Ambulatory Visit | Attending: Gastroenterology | Admitting: Gastroenterology

## 2022-03-14 DIAGNOSIS — R140 Abdominal distension (gaseous): Secondary | ICD-10-CM | POA: Insufficient documentation

## 2022-03-15 DIAGNOSIS — R142 Eructation: Secondary | ICD-10-CM | POA: Diagnosis not present

## 2022-03-15 DIAGNOSIS — K219 Gastro-esophageal reflux disease without esophagitis: Secondary | ICD-10-CM | POA: Diagnosis not present

## 2022-03-15 DIAGNOSIS — K449 Diaphragmatic hernia without obstruction or gangrene: Secondary | ICD-10-CM | POA: Diagnosis not present

## 2022-03-18 ENCOUNTER — Encounter: Payer: Self-pay | Admitting: Pulmonary Disease

## 2022-03-18 ENCOUNTER — Telehealth (INDEPENDENT_AMBULATORY_CARE_PROVIDER_SITE_OTHER): Payer: Self-pay | Admitting: Gastroenterology

## 2022-03-18 ENCOUNTER — Other Ambulatory Visit (INDEPENDENT_AMBULATORY_CARE_PROVIDER_SITE_OTHER): Payer: Self-pay | Admitting: Gastroenterology

## 2022-03-18 DIAGNOSIS — K581 Irritable bowel syndrome with constipation: Secondary | ICD-10-CM

## 2022-03-18 DIAGNOSIS — M7061 Trochanteric bursitis, right hip: Secondary | ICD-10-CM | POA: Diagnosis not present

## 2022-03-18 DIAGNOSIS — S134XXA Sprain of ligaments of cervical spine, initial encounter: Secondary | ICD-10-CM | POA: Diagnosis not present

## 2022-03-18 DIAGNOSIS — M4004 Postural kyphosis, thoracic region: Secondary | ICD-10-CM | POA: Diagnosis not present

## 2022-03-18 DIAGNOSIS — S233XXA Sprain of ligaments of thoracic spine, initial encounter: Secondary | ICD-10-CM | POA: Diagnosis not present

## 2022-03-18 DIAGNOSIS — K219 Gastro-esophageal reflux disease without esophagitis: Secondary | ICD-10-CM

## 2022-03-18 DIAGNOSIS — S338XXA Sprain of other parts of lumbar spine and pelvis, initial encounter: Secondary | ICD-10-CM | POA: Diagnosis not present

## 2022-03-18 MED ORDER — OMEPRAZOLE 40 MG PO CPDR: 40.0000 mg | DELAYED_RELEASE_CAPSULE | Freq: Two times a day (BID) | ORAL | 3 refills | Status: DC

## 2022-03-18 MED ORDER — LUBIPROSTONE 8 MCG PO CAPS: 8.0000 ug | ORAL_CAPSULE | Freq: Two times a day (BID) | ORAL | 1 refills | Status: DC

## 2022-03-18 NOTE — Telephone Encounter (Signed)
Patient states that he missed a call from the nurse. Returning call.  ?

## 2022-03-18 NOTE — Telephone Encounter (Signed)
Received results of most recent KUB that showed presence of stool.  He will be prescribed Amitiza 8 mcg twice daily for management of IBS-C. ? ?Also received the results of manometry which showed decreased GE junction IRP (7.2), without evidence of disordered peristalsis but there was incomplete bolus clearance due to large hiatal hernia.  pH impedance testing showed presence of minimal episodes of acid reflux (90 episodes, longest reflux episodes of 9.8 minutes and belching index in 28.6, total distal time pH less than 4 is 2.3% and DeMeester score was 9.0.  There is presence of ongoing reflux while on PPI which is just above the threshold.  Due to this, we will try switching him to another PPI (omeprazole 40 mg twice a day), he was advised to avoid food that will trigger the symptoms after taking the medicine at least 30 minutes before his meals.  If this fails to improve his symptoms he will need to undergo surgical management +/- C-TIF. ?

## 2022-03-19 ENCOUNTER — Encounter (INDEPENDENT_AMBULATORY_CARE_PROVIDER_SITE_OTHER): Payer: Self-pay

## 2022-03-19 ENCOUNTER — Telehealth (INDEPENDENT_AMBULATORY_CARE_PROVIDER_SITE_OTHER): Payer: Self-pay

## 2022-03-19 NOTE — Telephone Encounter (Signed)
?  Patient aware. ? ?Date: 03/19/2022 ? ?[facility] ?[address] ?[place], [place] [address] ? ? ?RE: We?ve approved your request for coverage of Omeprazole Cap '40mg'$ . ? ?Dear Elijah Shaffer: ? ?We?re pleased to let you know that we?ve approved your or your doctor?s request for coverage ?for Omeprazole Cap '40mg'$ . You can now fill your prescription, and it will be covered according ?to your plan. ?As long as you remain covered by your prescription drug plan and there are no changes to your ?plan benefits, this request is approved from 03/19/2022 to 03/20/2023. When this approval ?expires, please speak to your doctor about your treatment. ?Sincerely, ?[facility]? ? ?cc: Dr. Harvel Quale ?If you have questions, we want to help. ?Call the number on your prescription ID card or in your plan materials to sp ?

## 2022-03-19 NOTE — Telephone Encounter (Signed)
Date: 03/19/2022 ? ? ?[facility] ?[address] ?[place], [place] [address] ? ? ?RE: We?ve approved your request for coverage of Lubiprostone Caps. ? ?Dear [name] Holsonback: ? ?We?re pleased to let you know that we?ve approved your or your doctor?s request for coverage ?for Lubiprostone Caps. You can now fill your prescription, and it will be covered according to ?your plan. ?As long as you remain covered by your prescription drug plan and there are no changes to your ?plan benefits, this request is approved from 03/19/2022 to 03/19/2025. When this approval ?expires, please speak to your doctor about your treatment. ?Sincerely, ?[facility]? ? ? ?cc: Dr. Harvel Quale ?If you have questions, we want to help. ?Call the number on your prescription ID card or in your plan materials to speak with a Representative. ?

## 2022-03-21 ENCOUNTER — Ambulatory Visit: Payer: 59 | Admitting: Dermatology

## 2022-03-21 DIAGNOSIS — L2089 Other atopic dermatitis: Secondary | ICD-10-CM | POA: Diagnosis not present

## 2022-03-21 DIAGNOSIS — L299 Pruritus, unspecified: Secondary | ICD-10-CM

## 2022-03-21 DIAGNOSIS — L308 Other specified dermatitis: Secondary | ICD-10-CM | POA: Diagnosis not present

## 2022-03-21 DIAGNOSIS — L219 Seborrheic dermatitis, unspecified: Secondary | ICD-10-CM

## 2022-03-21 MED ORDER — CLOBETASOL PROPIONATE 0.05 % EX SOLN: 1.0000 | Freq: Two times a day (BID) | CUTANEOUS | 1 refills | Status: DC

## 2022-03-21 MED ORDER — CLOBETASOL PROPIONATE 0.05 % EX CREA: 1.0000 "application " | TOPICAL_CREAM | Freq: Two times a day (BID) | CUTANEOUS | 0 refills | Status: DC

## 2022-03-21 MED ORDER — KETOCONAZOLE 2 % EX SHAM: 1.0000 "application " | MEDICATED_SHAMPOO | Freq: Every day | CUTANEOUS | 11 refills | Status: DC | PRN

## 2022-03-21 MED ORDER — HYDROXYZINE PAMOATE 25 MG PO CAPS: 25.0000 mg | ORAL_CAPSULE | Freq: Three times a day (TID) | ORAL | 2 refills | Status: DC | PRN

## 2022-03-21 NOTE — Patient Instructions (Addendum)
For Eczema skin care for itchy areas at body  ? ?Buy TWO 16oz jars of CeraVe moisturizing cream ? [facility], [facility] (no prescription needed) ? Costs about $15 per jar  ? ?Jar #1: Use as a moisturizer as needed. Can be applied to any area of the body. Use twice daily to unaffected areas. ? ?Jar #2: Pour one 40m bottle of clobetasol 0.05% solution into jar, mix well. Label this jar to indicate the medication has been added. Use twice daily to affected areas. Do not apply to face, groin or underarms. ? ?Moisturizer may burn or sting initially. Try for at least 4 weeks.  ? ?Use clobetasol cream - apply twice daily to right ankle and left leg until itchy rash clears. Can use as needed for flares. Avoid face, groin, and axilla ? ?Continue hydroxyzine 25 mg capsule - take 1 capsule by mouth at bedtime as needed for itch  ? ?Topical steroids (such as triamcinolone, fluocinolone, fluocinonide, mometasone, clobetasol, halobetasol, betamethasone, hydrocortisone) can cause thinning and lightening of the skin if they are used for too long in the same area. Your physician has selected the right strength medicine for your problem and area affected on the body. Please use your medication only as directed by your physician to prevent side effects.  ? ?For itchy scalp ? ?Use ketoconazole shampoo - can use daily for itchy scalp, lather into scalp let sit for 3 - 5 min and then rinse.  ? ?For itchy bumps on chest and arms ?Keratosis Pilaris at chest and arms  ?- Tiny follicular keratotic papules ?- Benign. Genetic in nature. No cure ?- If desired, patient can use an emollient (moisturizer) containing ammonium lactate, urea or salicylic acid once a day to smooth the area ? ? ?Recommend starting moisturizer with exfoliant (Urea, Salicylic acid, or Lactic acid) one to two times daily to help smooth rough and bumpy skin.  OTC options include Cetaphil Rough and Bumpy lotion (Urea), Eucerin Roughness Relief lotion or spot treatment  cream (Urea), CeraVe SA lotion/cream for Rough and Bumpy skin (Sal Acid), Gold Bond Rough and Bumpy cream (Sal Acid), and AmLactin 12% lotion/cream (Lactic Acid).  If applying in morning, also apply sunscreen to sun-exposed areas, since these exfoliating moisturizers can increase sensitivity to sun. ? ? ? ?Topical steroids (such as triamcinolone, fluocinolone, fluocinonide, mometasone, clobetasol, halobetasol, betamethasone, hydrocortisone) can cause thinning and lightening of the skin if they are used for too long in the same area. Your physician has selected the right strength medicine for your problem and area affected on the body. Please use your medication only as directed by your physician to prevent side effects.  ? ? ?Gentle Skin Care Guide ? ?1. Bathe no more than once a day. ? ?2. Avoid bathing in hot water ? ?3. Use a mild soap like Dove, Vanicream, Cetaphil, CeraVe. Can use Lever 2000 or Cetaphil antibacterial soap ? ?4. Use soap only where you need it. On most days, use it under your arms, between your legs, and on your feet. Let the water rinse other areas unless visibly dirty. ? ?5. When you get out of the bath/shower, use a towel to gently blot your skin dry, don't rub it. ? ?6. While your skin is still a little damp, apply a moisturizing cream such as Vanicream, CeraVe, Cetaphil, Eucerin, Sarna lotion or plain Vaseline Jelly. For hands apply Neutrogena [place]Hand Cream or Excipial Hand Cream. ? ?7. Reapply moisturizer any time you start to itch or feel  dry. ? ?8. Sometimes using free and clear laundry detergents can be helpful. Fabric softener sheets should be avoided. Downy Free & Gentle liquid, or any liquid fabric softener that is free of dyes and perfumes, it acceptable to use ? ?9. If your doctor has given you prescription creams you may apply moisturizers over them  ? ? ?If You Need Anything After Your Visit ? ?If you have any questions or concerns for your doctor, please call our main  line at [phone] and press option 4 to reach your doctor's medical assistant. If no one answers, please leave a voicemail as directed and we will return your call as soon as possible. Messages left after 4 pm will be answered the following business day.  ? ?You may also send [place] a message via MyChart. We typically respond to MyChart messages within 1-2 business days. ? ?For prescription refills, please ask your pharmacy to contact our office. Our fax number is [phone]. ? ?If you have an urgent issue when the clinic is closed that cannot wait until the next business day, you can page your doctor at the number below.   ? ?Please note that while we do our best to be available for urgent issues outside of office hours, we are not available 24/7.  ? ?If you have an urgent issue and are unable to reach [place], you may choose to seek medical care at your doctor's office, retail clinic, urgent care center, or emergency room. ? ?If you have a medical emergency, please immediately call 911 or go to the emergency department. ? ?Pager Numbers ? ?- Dr. Nehemiah Massed: [phone] ? ?- Dr. Laurence Ferrari: [phone] ? ?- Dr. Nicole Kindred: [phone] ? ?In the event of inclement weather, please call our main line at [phone] for an update on the status of any delays or closures. ? ?Dermatology Medication Tips: ?Please keep the boxes that topical medications come in in order to help keep track of the instructions about where and how to use these. Pharmacies typically print the medication instructions only on the boxes and not directly on the medication tubes.  ? ?If your medication is too expensive, please contact our office at [phone] option 4 or send [place] a message through [employer].  ? ?We are unable to tell what your co-pay for medications will be in advance as this is different depending on your insurance coverage. However, we may be able to find a substitute medication at lower cost or fill out paperwork to get insurance to cover a  needed medication.  ? ?If a prior authorization is required to get your medication covered by your insurance company, please allow [place] 1-2 business days to complete this process. ? ?Drug prices often vary depending on where the prescription is filled and some pharmacies may offer cheaper prices. ? ?The website www.goodrx.com contains coupons for medications through different pharmacies. The prices here do not account for what the cost may be with help from insurance (it may be cheaper with your insurance), but the website can give you the price if you did not use any insurance.  ?- You can print the associated coupon and take it with your prescription to the pharmacy.  ?- You may also stop by our office during regular business hours and pick up a GoodRx coupon card.  ?- If you need your prescription sent electronically to a different pharmacy, notify our office through [facility] or by phone at [phone] option 4. ? ? ? ? ?Si [employer]  Despu?s de Su Visita ? ?Tambi?n puede enviarnos un mensaje a trav?s de MyChart. Por lo general respondemos a los mensajes de MyChart en el transcurso de 1 a 2 d?as h?biles. ? ?Para renovar recetas, por favor pida a su farmacia que se ponga en contacto con nuestra oficina. Nuestro n?mero de fax es el [phone]. ? ?Si tiene un asunto urgente cuando la cl?nica est? cerrada y que no puede esperar hasta el siguiente d?a h?bil, puede llamar/localizar a su doctor(a) al n?mero que aparece a continuaci?n.  ? ?Por favor, tenga en cuenta que aunque hacemos todo lo posible para estar disponibles para asuntos urgentes fuera del horario de oficina, no estamos disponibles las 24 horas del d?a, los 7 d?as de la semana.  ? ?Si tiene un problema urgente y no puede comunicarse con nosotros, puede optar por buscar atenci?n m?dica  en el consultorio de su doctor(a), en una cl?nica privada, en un centro de atenci?n urgente o en una sala de emergencias. ? ?Si tiene Engineer, maintenance (IT)  m?dica, por favor llame inmediatamente al 911 o vaya a la sala de emergencias. ? ?N?meros de b?per ? ?- Dr. Nehemiah Massed: [phone] ? ?- Dra. Moye: [phone] ? ?- Dra. Nicole Kindred: [phone] ? ?En caso de in

## 2022-03-21 NOTE — Progress Notes (Signed)
? ?New Patient Visit ? ?Subjective  ?Elijah Shaffer is a 41 y.o. male who presents for the following: New Patient (Initial Visit) (Here concerning itchy bumps at arms, back, chest, left leg, and right ankle. Hx of eczema, allergies, and asthma. Has tried switching detergents and soaps. Also reports some itchy flaky scalp. Rx of ketoconazole shampoo given but would like refills. 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 ).  Uses Rx shampoo every 3 days, helps temporarily but itching comes back. Pt states he itches all over his body, especially spot on L lower leg never clears up.  Recently started TMC cream on it, but doesn't help.  Also is taking hydroxyzine before bed for itching.  He takes daily antihistamines and nasal spray for seasonla allergies ? ?The patient has spots, moles and lesions to be evaluated, some may be new or changing and the patient has concerns that these could be cancer. ? ? ?The following portions of the chart were reviewed this encounter and updated as appropriate:  ?  ?  ? ?Review of Systems:  No other skin or systemic complaints except as noted in HPI or Assessment and Plan. ? ?Objective  ?Well appearing patient in no apparent distress; mood and affect are within normal limits. ? ?A focused examination was performed including back, arms, legs, scalp, face. Relevant physical exam findings are noted in the Assessment and Plan. ? ?Scalp ?Mild scaling, pt c/o frequent itching scalp ? ?left ankle, left lower leg, chest, back ?Right ankle lateral- violaceous patches, left lower leg- pink scaly plaque, back with scattered pink papules. ? ? ? ?Assessment & Plan  ?Other eczema ? ?Seborrheic dermatitis ?Scalp ? ?Chronic and persistent condition with duration or expected duration over one year. Condition is symptomatic/ bothersome to patient. Not currently at goal.  ? ?Seborrheic Dermatitis  ?-  is a chronic persistent rash characterized by pinkness and scaling  most commonly of the mid face but also can occur on the scalp (dandruff), ears; mid chest, mid back and groin.  It tends to be exacerbated by stress and cooler weather.  People who have neurologic disease may experience new onset or exacerbation of existing seborrheic dermatitis.  The condition is not curable but treatable and can be controlled. ? ?Increase use of Ketoconazole shampoo - apply daily prn for itchy scalp, massage into scalp and leave in for several minutes before rinsing out ? ? ? ? ?ketoconazole (NIZORAL) 2 % shampoo - Scalp ?Apply 1 application. topically daily as needed for irritation. ? ?Itching ? ?Other atopic dermatitis ?left ankle, left lower leg, chest, back ? ?Chronic and persistent condition with duration or expected duration over one year. Condition is bothersome/symptomatic for patient. Currently flared. Pt has persistent itching. Reports hx of asthma and allergies  ? ?Atopic dermatitis (eczema) is a chronic, relapsing, pruritic condition that can significantly affect quality of life. It is often associated with allergic rhinitis and/or asthma and can require treatment with topical medications, phototherapy, or in severe cases biologic injectable medication (Dupixent; Adbry) or Oral JAK inhibitors. ? ?Recommend mild soap and moisturizing cream 1-2 times daily.  Gentle skin care handout provided.  ? ? ? ?Start Cerave cream with clobetasol sol. mixture bid to body x 4 weeks ? ?Eczema Skin Care ? ?Buy TWO 16oz jars of CeraVe moisturizing cream ? [facility], [facility] (no prescription needed) ? Costs about $15 per jar  ? ?Jar #1: Use as a moisturizer as needed. Can  be applied to any area of the body. Use twice daily to unaffected areas. ? ?Jar #2: Pour one 66m bottle of clobetasol 0.05% solution into jar, mix well. Label this jar to indicate the medication has been added. Use twice daily to affected areas. Do not apply to face, groin or underarms. ? ?Moisturizer may burn or sting initially.  Try for at least 4 weeks.  ? ?Start clobetasol cream bid to aas L lower leg until itchy rash cleared. Avoid f/g/a. Caution skin atrophy with long-term use.  ?Continue hydroxyzine 25 mg PO tid prn itch. ? ? ? ? ?clobetasol (TEMOVATE) 0.05 % external solution - left ankle, left lower leg, chest, back ?Apply 1 application. topically 2 (two) times daily. Use as directed.  Mix with cerave cream. Use for up to 4 weeks. Avoid applying to face, groin, and axilla. Use as directed. ? ?clobetasol cream (TEMOVATE) 0.05 % - left ankle, left lower leg, chest, back ?Apply 1 application. topically 2 (two) times daily. Apply to aa of right ankle and left leg until itchy rash clear. Use prn when flared. Avoid applying to face, groin, and axilla. Use as directed. ? ?hydrOXYzine (VISTARIL) 25 MG capsule - left ankle, left lower leg, chest, back ?Take 1 capsule (25 mg total) by mouth every 8 (eight) hours as needed. Take before bed as needed for itchy. Can cause drowsiness. ? ? ?Keratosis Pilaris at chest and arms  ?- Tiny follicular keratotic papules ?- Benign. Genetic in nature. No cure. ?- Observe. ?- If desired, patient can use an emollient (moisturizer) containing ammonium lactate, urea or salicylic acid once a day to smooth the area ? ?Hemangiomas ?- Red papules ?- Discussed benign nature ?- Observe ?- Call for any changes ? ?Lentigines ?- Scattered tan macules ?- Due to sun exposure ?- Benign-appering, observe ?- Recommend daily broad spectrum sunscreen SPF 30+ to sun-exposed areas, reapply every 2 hours as needed. ?- Call for any changes ? ?Seborrheic Keratoses ?- Stuck-on, waxy, tan-brown papules and/or plaques  ?- Benign-appearing ?- Discussed benign etiology and prognosis. ?- Observe ?- Call for any changes ? ?Actinic Damage ?- chronic, secondary to cumulative UV radiation exposure/sun exposure over time ?- diffuse scaly erythematous macules with underlying dyspigmentation ?- Recommend daily broad spectrum sunscreen SPF 30+  to sun-exposed areas, reapply every 2 hours as needed.  ?- Recommend staying in the shade or wearing long sleeves, sun glasses (UVA+UVB protection) and wide brim hats (4-inch brim around the entire circumference of the hat). ?- Call for new or changing lesions. ? ?History of PreCancerous Actinic Keratosis  ?- site(s) of PreCancerous Actinic Keratosis clear today. BL ears ?- these may recur and new lesions may form requiring treatment to prevent transformation into skin cancer ?- observe for new or changing spots and [place] ?- photoprotection with sun protective clothing; sunglasses and broad spectrum sunscreen with SPF of at least 30 + and frequent self skin exams recommended ?- yearly exams by a dermatologist recommended for persons with history of PreCancerous Actinic Keratoses  ? ?Return in about 4 weeks (around 04/18/2022) for eczema and seb derm follow up;. ? ?I, MRuthell Rummage CMA, am acting as scribe for TBrendolyn Patty MD. ? ?Documentation: I have reviewed the above documentation for accuracy and completeness, and I agree with the above. ? ?TBrendolyn PattyMD  ? ?

## 2022-03-25 ENCOUNTER — Ambulatory Visit (INDEPENDENT_AMBULATORY_CARE_PROVIDER_SITE_OTHER): Payer: 59 | Admitting: Cardiology

## 2022-03-25 ENCOUNTER — Encounter: Payer: Self-pay | Admitting: Cardiology

## 2022-03-25 VITALS — BP 120/76 | HR 56 | Ht 75.0 in | Wt 261.8 lb

## 2022-03-25 DIAGNOSIS — E782 Mixed hyperlipidemia: Secondary | ICD-10-CM | POA: Diagnosis not present

## 2022-03-25 DIAGNOSIS — I471 Supraventricular tachycardia, unspecified: Secondary | ICD-10-CM | POA: Insufficient documentation

## 2022-03-25 DIAGNOSIS — Z79891 Long term (current) use of opiate analgesic: Secondary | ICD-10-CM | POA: Diagnosis not present

## 2022-03-25 DIAGNOSIS — R0609 Other forms of dyspnea: Secondary | ICD-10-CM | POA: Diagnosis not present

## 2022-03-25 DIAGNOSIS — R030 Elevated blood-pressure reading, without diagnosis of hypertension: Secondary | ICD-10-CM | POA: Diagnosis not present

## 2022-03-25 DIAGNOSIS — R2689 Other abnormalities of gait and mobility: Secondary | ICD-10-CM | POA: Diagnosis not present

## 2022-03-25 DIAGNOSIS — R251 Tremor, unspecified: Secondary | ICD-10-CM | POA: Diagnosis not present

## 2022-03-25 DIAGNOSIS — R42 Dizziness and giddiness: Secondary | ICD-10-CM | POA: Diagnosis not present

## 2022-03-25 MED ORDER — PROPRANOLOL HCL 20 MG PO TABS: ORAL_TABLET | ORAL | 3 refills | Status: DC

## 2022-03-25 NOTE — Patient Instructions (Addendum)
Medication Instructions:  ?Changing propranolol  twice a day  ? ?*If you need a refill on your cardiac medications before your next appointment, please call your pharmacy* ? ? ?Lab Work: ? ?If you have labs (blood work) drawn today and your tests are completely normal, you will receive your results only by: ?MyChart Message (if you have MyChart) OR ?A paper copy in the mail ?If you have any lab test that is abnormal or we need to change your treatment, we will call you to review the results. ? ? ?Testing/Procedures: ?Not needed ? ? ?Follow-Up: ?At [facility], you and your health needs are our priority.  As part of our continuing mission to provide you with exceptional heart care, we have created designated Provider Care Teams.  These Care Teams include your primary Cardiologist (physician) and Advanced Practice Providers (APPs -  Physician Assistants and Nurse Practitioners) who all work together to provide you with the care you need, when you need it. ? ?  ? ?Your next appointment:   ?12 month(s) ? ?The format for your next appointment:   ?In Person ? ?Provider:   ?None  ? ? ?Other Instructions  ? ?Recommendations for vagal maneuvers: ?"Bearing down" ?Coughing ?Gagging ?Icy, cold towel on face or drink ice cold water  ?

## 2022-03-25 NOTE — Progress Notes (Signed)
Primary Care Provider: Lindell Spar, MD Cardiologist: Glenetta Hew, MD Electrophysiologist: None  Clinic Note: Chief Complaint  Patient presents with   Follow-up    Overall symptoms have improved, but still has tremor and palpitations. Symptoms improved with addition of propranolol.    ===================================  ASSESSMENT/PLAN   Problem List Items Addressed This Visit       Cardiology Problems   Mixed hyperlipidemia (Chronic)    With minimal CAD on cardiac cath, lipids are pretty well controlled with just his triglycerides are elevated.  I would be reluctant to treat triglycerides until all of his other issues are resolved.  Would probably benefit from converting to Vascepa.  Needs dietary modification.      Relevant Medications   propranolol (INDERAL) 20 MG tablet   PSVT (paroxysmal supraventricular tachycardia) (HCC) - Primary (Chronic)    No significant findings noted on monitor, but he clearly was having palpitation symptoms.  Things seem to have improved both the tremors and the palpitations while being on propranolol PRN.  Plan: We will change propranolol to standing dose of propranolol 10 mg twice daily.  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  Would probably not titrate further with resting heart rate of 56, but he feels much better on it.      Relevant Medications   propranolol (INDERAL) 20 MG tablet     Other   Dyspnea on minimal exertion (Chronic)    Cardiac evaluation to date has been pretty much normal.  Low likelihood with his cholesterol panel and age that he would have microvascular disease leading to this [place] dyspnea.  Would look for noncardiac etiology.  I am getting continue the beta-blocker for his palpitations, and we will therefore monitor him on an annual basis.       ===================================  HPI:    Elijah Shaffer is a 41 y.o. male with a PMH  notable for ? PSVT, 1  AVB and Wenckebach block, OSA-CPAP with ? Pulm HTN, prior history of syncope (April 2022) who presents today for ~2 month follow-up.  I saw Elijah Shaffer for initial consult on December 10, 2021 for concern of Pulmonary Hypertension with complaints of significant exertional dyspnea and fatigue at the request of Dr. Erin Fulling from Pulmonary Medicine.  = Planned R&LHC Feb 1 (normal Coronaries & ~ normal RHC #s),but did have a possible SVT run. => Zio Patch Monitor ordered revealed NSR 1  AVB and Wenckebach Block,   Recent Hospitalizations/ ER or Clinic Visits:  February 16 with complaints of dyspnea, left-sided chest pain and fatigue/lightheaded.  Urgent care evaluation negative. => There was consideration of possible RMSF or Lyme disease (negative work-up)  He was seen on 01/14/2022 by Diona Browner, NP in response to the telephone call on February 16.  We noted daily palpitations and lightheadedness with dyspnea on minimal activity.  Noted significant unsteady gait and poor balance as of a new Tikosyn sit down when his episodes occur.  Still had tremors and generalized weakness/fatigue.  Still do not have cause for symptoms. Orthostatics = normal.  recommended [place] monitor to keep track of palpitations -> planned to avoid AV Nodal agents.  Left-sided epigastric/chest pain thought to be related to hiatal hernia.  Recommended continued follow-up with GI and PCP. With new diagnosis of OSA, recommended adherence to CPAP.  Otherwise reason for exertional dyspnea is not determined.  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  Apparently in August he was treated with doxycycline for 2 weeks and follow-up with September showed low convalescent titers.  He was also told that the constellation of symptoms are probably not related to his unusual symptoms.  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  This could cause numbness and tingling, but would be unusual in the absence of muscle weakness.  But it would not explain other symptoms.  Also noted hyperreflexia but spinal cord MRI negative GAD antibodies negative.  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   Reviewed  CV studies:    The following studies were reviewed today: (if available, images/films reviewed: From Epic Chart or Care Everywhere)  Right Left Heart Cath 12/12/2021: Normal coronaries with normal hemodynamics.  EF 50 to 55% by LV gram.  Brief run of SVT prior to cath. RA = 4 mmHg, RV = 23/4; PA = 22/6 (14) & PCW = 7; Ao sat = 95%; PA sat = 76%, 76%& SVC sat = 74%Fick cardiac output/index = 7.1/3.0; PVR = 1.0 WU Consider CPX testing to further evaluate cause of dyspnea. Place Zio monitor to further assess possible SVT.   Zio patch event monitor (March 2023): Sinus rhythm with heart rate range 24 to 159 bpm, 1  AVB and Wenckebach block noted along with rare PACs and PVCs.  No arrhythmias.  CPX 01/09/2022: Interpretation limited by submaximal effort.  Severe functional impairment when compared to max sedentary norms.  No cardiopulmonary limitations.  Noted tremor and generalized weakness that lead to premature exercise termination-consider neuromuscular evaluation. Report recommended neuro evaluation =>  Normal MRI of C-spine with EMG test pending.    Interval History:   Elijah Shaffer returns here today for follow-up.  He says overall, the tremors are better since he was started on PRN propranolol, but still has some tremors and the palpitations.  Overall doing better back at work now still has some fatigue but in general he feels better.  He has a Clinical cytogeneticist just in case he has any other significant palpitation episodes.  He still has exertional dyspnea, and he and his brother somewhat frustrated that there has been no clear-cut indication for what the cause  would be.  No further chest pain just epigastric pain.  CV Review of Symptoms (Summary) Cardiovascular ROS: positive for - dyspnea on exertion, irregular heartbeat, palpitations, and some associated lightheadedness and dizziness.  Persistent exertional dyspnea and tremor.  Also epigastric pain negative for - chest pain, orthopnea, paroxysmal nocturnal dyspnea, rapid heart rate, shortness of breath, or syncope/near syncope or TIA/amaurosis fugax, claudication  REVIEWED OF SYSTEMS   Review of Systems  Constitutional:  Positive for malaise/fatigue. Negative for weight loss.  HENT:  Negative for congestion and nosebleeds.   Respiratory:  Positive for cough and shortness of breath. Negative for sputum production and wheezing.        Mostly per HPI.  Cardiovascular:        Per HPI  Gastrointestinal:  Negative for blood in stool and melena.  Genitourinary:  Negative for hematuria.  Musculoskeletal:  Positive for joint pain.  Neurological:  Positive for dizziness. Negative for focal weakness, weakness and headaches.  Psychiatric/Behavioral:  Negative for depression (Although he does seem to have somewhat of a depressed mood.) and memory loss (No memory loss, just poor historian.  Brother provides a lot of history.). The patient is nervous/anxious.  The patient does not have insomnia.    I have reviewed and (if needed) personally updated the patient's problem list, medications, allergies, past medical and surgical history, social and family history.   PAST MEDICAL HISTORY   Past Medical History:  Diagnosis Date   Acute renal injury ([place]) 06/28/2017   Anxiety    Aspiration pneumonia of right upper lobe due to gastric secretions ([place])    Diverticulitis    GERD (gastroesophageal reflux disease) 01/08/2018   Seasonal allergies    Syncope 06/26/2021    PAST SURGICAL HISTORY   Past Surgical History:  Procedure Laterality Date   BIOPSY  03/18/2021   Procedure: BIOPSY;  Surgeon: Harvel Quale, MD;  [facility];  Service: Gastroenterology;;  esophageal at Z-line   BIOPSY  05/23/2021   Procedure: BIOPSY;  Surgeon: Harvel Quale, MD;  [facility];  Service: Gastroenterology;;   CARDIOPULMONARY EXERCISE TEST (CPX)  01/09/2022   Interpretation limited by submaximal effort.  Severe functional impairment when compared to max sedentary norms.  No cardiopulmonary limitations.  Noted tremor and generalized weakness that lead to premature exercise termination-consider neuromuscular evaluation.   COLONOSCOPY N/A 09/22/2017   Procedure: COLONOSCOPY;  Surgeon: Rogene Houston, MD;  [facility];  Service: Endoscopy;  Laterality: N/A;  9:55   COLONOSCOPY WITH PROPOFOL N/A 05/23/2021   Procedure: COLONOSCOPY WITH PROPOFOL;  Surgeon: Harvel Quale, MD;  [facility];  Service: Gastroenterology;  Laterality: N/A;  7:30   ESOPHAGOGASTRODUODENOSCOPY (EGD) WITH PROPOFOL N/A 03/18/2021   Procedure: ESOPHAGOGASTRODUODENOSCOPY (EGD) WITH PROPOFOL;  Surgeon: Harvel Quale, MD;  [facility];  Service: Gastroenterology;  Laterality: N/A;   ESOPHAGOGASTRODUODENOSCOPY (EGD) WITH PROPOFOL N/A 05/23/2021   Procedure: ESOPHAGOGASTRODUODENOSCOPY (EGD) WITH PROPOFOL;  Surgeon: Harvel Quale, MD;  [facility];  Service: Gastroenterology;  Laterality: N/A;   POLYPECTOMY  09/22/2017   Procedure: POLYPECTOMY;  Surgeon: Rogene Houston, MD;  [facility];  Service: Endoscopy;;   RIGHT/LEFT HEART CATH AND CORONARY ANGIOGRAPHY N/A 12/12/2021   Procedure: RIGHT/LEFT HEART CATH AND CORONARY ANGIOGRAPHY;  Surgeon: Jolaine Artist, MD;  Location: [facility];  Service: Cardiovascular:: Normal Coronaries & Hemodynamics.  EF 50-55%. RA = 4 mmHg, RV = 23/4; PA = 22/6 (14) & PCW = 7; Ao sat = 95%; PA sat = 76%, 76%& SVC sat = 74%Fick cardiac output/index = 7.1/3.0; PVR = 1.0 WU    TRANSTHORACIC ECHOCARDIOGRAM  02/24/2021   EF 60 to 65%.  Normal LV function.  Normal valves.  Mild RV dilation with normal function.   ZIO PATCH EVENT MONITOR  01/2022   Sinus rhythm with heart rate range 24 to 159 bpm, 1  AVB and Wenckebach block noted along with rare PACs and PVCs.  No arrhythmias.    Immunization History  Administered Date(s) Administered   Influenza,inj,Quad PF,6+ Mos 09/10/2021   Moderna SARS-COV2 Booster Vaccination 02/11/2020, 03/10/2020, 10/08/2020, 11/09/2021   Tdap 02/08/2017    MEDICATIONS/ALLERGIES   Current Meds  Medication Sig   aspirin EC 81 MG tablet Take 81 mg by mouth daily. Swallow whole.   b complex vitamins capsule Take 1 capsule by mouth daily. With D3 gummie   betamethasone dipropionate 0.05 % cream Apply 1 application topically 2 (two) times daily as needed (Rash).   cetirizine (ZYRTEC) 10 MG tablet Take 10 mg by mouth daily.   clobetasol (TEMOVATE) 0.05 % external solution Apply 1 application. topically 2 (  two) times daily. Use as directed.  Mix with cerave cream. Use for up to 4 weeks. Avoid applying to face, groin, and axilla. Use as directed.   clobetasol cream (TEMOVATE) 1.76 % Apply 1 application. topically 2 (two) times daily. Apply to aa of right ankle and left leg until itchy rash clear. Use prn when flared. Avoid applying to face, groin, and axilla. Use as directed.   clonazePAM (KLONOPIN) 0.5 MG tablet Take 0.5 mg by mouth daily as needed.   cyclobenzaprine (FLEXERIL) 5 MG tablet Take 1 tablet (5 mg total) by mouth 3 (three) times daily as needed for muscle spasms.   diphenhydrAMINE-zinc acetate (BENADRYL EXTRA STRENGTH) cream Apply 1 application topically 3 (three) times daily as needed for itching.   docusate sodium (COLACE) 100 MG capsule Take 100 mg by mouth 2 (two) times daily.   FLUoxetine (PROZAC) 20 MG capsule Take 1 capsule (20 mg total) by mouth daily.   fluticasone (FLONASE ALLERGY RELIEF) 50 MCG/ACT nasal spray Place 2  sprays into both nostrils daily as needed for allergies or rhinitis. (Patient taking differently: Place 2 sprays into both nostrils daily.)   guaiFENesin (MUCINEX) 600 MG 12 hr tablet Take 600 mg by mouth 2 (two) times daily as needed for to loosen phlegm.   hydrOXYzine (VISTARIL) 25 MG capsule Take 1 capsule (25 mg total) by mouth every 8 (eight) hours as needed. Take before bed as needed for itchy. Can cause drowsiness.   ketoconazole (NIZORAL) 2 % shampoo Apply 1 application. topically 3 (three) times a week.   ketoconazole (NIZORAL) 2 % shampoo Apply 1 application. topically daily as needed for irritation.   lubiprostone (AMITIZA) 8 MCG capsule Take 1 capsule (8 mcg total) by mouth 2 (two) times daily with a meal.   meclizine (ANTIVERT) 12.5 MG tablet Take 12.5 mg by mouth daily as needed for dizziness.   montelukast (SINGULAIR) 10 MG tablet Take 1 tablet (10 mg total) by mouth at bedtime.   omega-3 acid ethyl esters (LOVAZA) 1 g capsule Take 2 capsules (2 g total) by mouth 2 (two) times daily.   omeprazole (PRILOSEC) 40 MG capsule Take 1 capsule (40 mg total) by mouth 2 (two) times daily.   ondansetron (ZOFRAN) 4 MG tablet Take 1 tablet (4 mg total) by mouth every 6 (six) hours as needed for nausea.   Potassium 99 MG TABS Take 1 tablet by mouth daily.   propranolol (INDERAL) 20 MG tablet Take one tablet twice daily as needed for palpitations   triamcinolone cream (KENALOG) 0.1 % Apply 1 application. topically 2 (two) times daily.    Allergies  Allergen Reactions   Claritin [Loratadine] Other (See Comments)    Heart race   Loratadine-Pseudoephedrine Er Palpitations   Pollen Extract Other (See Comments)    Seasonal allergies    SOCIAL HISTORY/FAMILY HISTORY   Reviewed in Epic:  Pertinent findings:  Social History   Tobacco Use   Smoking status: Never    Passive exposure: Past   Smokeless tobacco: Never  Vaping Use   Vaping Use: Never used  Substance Use Topics   Alcohol use:  Yes    Comment: occ   Drug use: No   Social History   Social History Narrative   Not on file    OBJCTIVE -PE, EKG, labs   Wt Readings from Last 3 Encounters:  03/25/22 261 lb 12.8 oz (118.8 kg)  03/13/22 263 lb (119.3 kg)  03/07/22 263 lb 8 oz (119.5 kg)  Physical Exam: BP 120/76   Pulse (!) 56   Ht '6\' 3"'$  (1.905 m)   Wt 261 lb 12.8 oz (118.8 kg)   SpO2 96%   BMI 32.72 kg/m  Physical Exam Vitals reviewed.  Constitutional:      General: He is not in acute distress.    Appearance: Normal appearance. He is obese. He is not ill-appearing or toxic-appearing.  HENT:     Head: Normocephalic and atraumatic.  Neck:     Vascular: No carotid bruit.  Cardiovascular:     Rate and Rhythm: Regular rhythm. Bradycardia present.     Pulses: Normal pulses.     Heart sounds: Normal heart sounds. No murmur heard.    No friction rub. No gallop.  Pulmonary:     Effort: Pulmonary effort is normal. No respiratory distress.     Breath sounds: Normal breath sounds. No wheezing, rhonchi or rales.  Chest:     Chest wall: No tenderness.  Musculoskeletal:        General: No swelling. Normal range of motion.     Cervical back: Normal range of motion and neck supple.  Skin:    General: Skin is warm and dry.     Coloration: Skin is not jaundiced.  Neurological:     General: No focal deficit present.     Mental Status: He is alert and oriented to person, place, and time.     Gait: Gait normal.  Psychiatric:        Mood and Affect: Mood normal.        Behavior: Behavior normal.     Comments: We will plan for screening.  Brother assist with providing history.     Adult ECG Report N/a  Recent Labs:  no new labs  Lab Results  Component Value Date   CHOL 122 11/22/2021   HDL 31 (L) 11/22/2021   LDLCALC 25 11/22/2021   TRIG 481 (H) 11/22/2021   CHOLHDL 3.9 11/22/2021   Lab Results  Component Value Date   CREATININE 1.31 (H) 12/27/2021   BUN 19 12/27/2021   NA 134 (L) 12/27/2021    K 3.8 12/27/2021   CL 104 12/27/2021   CO2 20 (L) 12/27/2021      Latest Ref Rng & Units 12/27/2021    2:43 PM 12/12/2021    8:56 AM 12/12/2021    8:44 AM  CBC  WBC 4.0 - 10.5 K/uL 8.2      Hemoglobin 13.0 - 17.0 g/dL 16.7   14.6   15.3    Hematocrit 39.0 - 52.0 % 49.8   43.0   45.0    Platelets 150 - 400 K/uL 257        Lab Results  Component Value Date   HGBA1C 5.4 02/24/2021   Lab Results  Component Value Date   TSH 1.440 07/18/2021    ==================================================  COVID-19 Education: The signs and symptoms of COVID-19 were discussed with the patient and how to seek care for testing (follow up with PCP or arrange E-visit).    I spent a total of 21 minutes with the patient spent in direct patient consultation.  Additional time spent with chart review  / charting (studies, outside notes, etc): 28 min => reviewed urgent care, clinic notes, CPX, and cardiac cath Reports. Total Time: 49 min  Current medicines are reviewed at length with the patient today.  (+/- concerns) N/A  Notice: This dictation was prepared with Dragon dictation along with smart phrase technology. Any  transcriptional errors that result from this process are unintentional and may not be corrected upon review.  Studies Ordered:   No orders of the defined types were placed in this encounter.  Meds ordered this encounter  Medications   propranolol (INDERAL) 20 MG tablet    Sig: Take one tablet 20 mg  twice daily  may take an  additional dose of 20 mg  as needed for palpitations    Dispense:  190 tablet    Refill:  3    Directions changed . Patient will call when prescription is needed    Patient Instructions / Medication Changes & Studies & Tests Ordered   Patient Instructions  Medication Instructions:  Changing propranolol  twice a day   *If you need a refill on your cardiac medications before your next appointment, please call your pharmacy*   Lab Work:  If you have  labs (blood work) drawn today and your tests are completely normal, you will receive your results only by: MyChart Message (if you have MyChart) OR A paper copy in the mail If you have any lab test that is abnormal or we need to change your treatment, we will call you to review the results.   Testing/Procedures: Not needed   Follow-Up: At [facility], you and your health needs are our priority.  As part of our continuing mission to provide you with exceptional heart care, we have created designated Provider Care Teams.  These Care Teams include your primary Cardiologist (physician) and Advanced Practice Providers (APPs -  Physician Assistants and Nurse Practitioners) who all work together to provide you with the care you need, when you need it.     Your next appointment:   12 month(s)  The format for your next appointment:   In Person  Provider:   None    Other Instructions   Recommendations for vagal maneuvers: "Bearing down" Coughing Gagging [place], cold towel on face or drink ice cold water      Glenetta Hew, M.D., M.S. Interventional Cardiologist   Pager # [phone] Phone # [phone] [address] [place], [place] [address]   Thank you for choosing Heartcare at [facility]!!

## 2022-03-26 ENCOUNTER — Encounter: Payer: Self-pay | Admitting: Neurology

## 2022-03-27 DIAGNOSIS — R251 Tremor, unspecified: Secondary | ICD-10-CM | POA: Diagnosis not present

## 2022-03-27 DIAGNOSIS — Z79891 Long term (current) use of opiate analgesic: Secondary | ICD-10-CM | POA: Diagnosis not present

## 2022-03-28 DIAGNOSIS — M7061 Trochanteric bursitis, right hip: Secondary | ICD-10-CM | POA: Diagnosis not present

## 2022-03-28 DIAGNOSIS — S233XXA Sprain of ligaments of thoracic spine, initial encounter: Secondary | ICD-10-CM | POA: Diagnosis not present

## 2022-03-28 DIAGNOSIS — S338XXA Sprain of other parts of lumbar spine and pelvis, initial encounter: Secondary | ICD-10-CM | POA: Diagnosis not present

## 2022-03-28 DIAGNOSIS — M4004 Postural kyphosis, thoracic region: Secondary | ICD-10-CM | POA: Diagnosis not present

## 2022-03-28 DIAGNOSIS — S134XXA Sprain of ligaments of cervical spine, initial encounter: Secondary | ICD-10-CM | POA: Diagnosis not present

## 2022-03-29 ENCOUNTER — Ambulatory Visit: Payer: 59 | Admitting: Licensed Clinical Social Worker

## 2022-03-29 DIAGNOSIS — F419 Anxiety disorder, unspecified: Secondary | ICD-10-CM

## 2022-03-29 DIAGNOSIS — I272 Pulmonary hypertension, unspecified: Secondary | ICD-10-CM

## 2022-03-29 DIAGNOSIS — A770 Spotted fever due to Rickettsia rickettsii: Secondary | ICD-10-CM

## 2022-03-29 DIAGNOSIS — K2270 Barrett's esophagus without dysplasia: Secondary | ICD-10-CM

## 2022-03-29 DIAGNOSIS — R0609 Other forms of dyspnea: Secondary | ICD-10-CM

## 2022-03-29 DIAGNOSIS — K219 Gastro-esophageal reflux disease without esophagitis: Secondary | ICD-10-CM

## 2022-03-29 DIAGNOSIS — E782 Mixed hyperlipidemia: Secondary | ICD-10-CM

## 2022-03-29 NOTE — Patient Instructions (Signed)
Visit Information  Thank you for taking time to visit with me today. Please don't hesitate to contact me if I can be of assistance to you before our next scheduled telephone appointment.  Following are the goals we discussed today:   Our next appointment is by telephone on 05/07/22 at 2:00 PM   Please call the care guide team at [phone] if you need to cancel or reschedule your appointment.   If you are experiencing a Mental Health or [place] or need someone to talk to, please call the [facility]: [phone]   Following is a copy of your full plan of care:  Care Plan : [place]  Updates made by Elijah Cabal, LCSW since 03/29/2022 12:00 AM     Problem: Coping Skills (General Plan of Care)      Goal: Coping Skills Enhanced. Manage daily needs. Manage ADLs completion. Manage anxiety issues and depression issues   Start Date: 02/08/2022  Expected End Date: 06/03/2022  This Visit's Progress: Not on track  Recent Progress: Not on track  Priority: High  Note:   Current barriers:   Has tremors Has transport needs Increased appetite Fatigues easily   Clinical Goals:  LCSW to talk with client in next 30 days via phone or in person to discuss client coping skills for daily needs Client to attend scheduled medical appointments in next 30 days Client to communicate with RNCM as needed in next 30 days for nursing support LCSW to talk with client in next 30 days about client management of depression issues  Clinical Interventions:  Collaboration with Elijah Spar, MD regarding development and update of comprehensive plan of care as evidenced by provider attestation and co-signature Assessment of needs, barriers of client  Discussed medication procurement with Elijah Shaffer Discussed employment of client. He has worked in Biomedical scientist, a very physical type of job.  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 Encouraged Elijah Shaffer to call RNCM as needed to discuss nursing needs of client Provided counseling support for client.  Client said he had used the weed killer Round Up in his job. 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 LCSW encouraged Elijah Shaffer to talk with Elijah Shaffer about client history of using Round Up weed killer.  Client said he was wondering if he needed to have blood test.  Encouraged client to please talk with Elijah Shaffer about these medical questions related to Round Up use.  Reviewed walking issues of client. Reviewed appetite of client. He said he is eating more recently Discussed memory issues. He said he is sometimes a little forgetful.  Encouraged Elijah Shaffer to communicate as needed with RNCM to discuss nursing needs of client Discussed energy level of client. He said he fatigues easily  Patient Coping Skills:  Has support from his mother Has no transport needs Attends medical appointments  Patient Deficits  Tremors Depression issues  Patient Goals:  Attend medical appointments in next 30 days Patient will communicate with LCSW in next 30 days to discuss coping skills of client Patient will communicate as needed with RNCM to discuss nursing needs of client  Follow Up Plan: LCSW to call client on 05/07/22 at 2:00 PM     Elijah Shaffer  was given information about Care Management services by the embedded care coordination team including:  Care Management services include personalized support from designated clinical staff supervised by his physician, including individualized plan of care and coordination with other care providers 24/7 contact phone numbers for assistance for urgent and routine care needs. The patient may stop CCM services at any time (effective at the end of the month) by phone call to the office staff.  Patient  agreed to services and verbal consent obtained.   Elijah Shaffer MSW, [place] representative [facility] Care Management [phone]

## 2022-03-29 NOTE — Chronic Care Management (AMB) (Signed)
Care Management Clinical Social Work Note  03/29/2022 Name: Elijah Shaffer MRN: [id number] DOB: 12-26-1980  Elijah Shaffer is a 41 y.o. year old male who is a primary care patient of Lindell Spar, MD.  The Care Management team was consulted for assistance with chronic disease management and coordination needs.  Engaged with patient by telephone for follow up visit in response to provider referral for social work chronic care management and care coordination services  Consent to Services:  Mr. Tokunaga was given information about Care Management services today including:  Care Management services includes personalized support from designated clinical staff supervised by his physician, including individualized plan of care and coordination with other care providers 24/7 contact phone numbers for assistance for urgent and routine care needs. The patient may stop case management services at any time by phone call to the office staff.  Patient agreed to services and consent obtained.   Assessment: Review of patient past medical history, allergies, medications, and health status, including review of relevant consultants reports was performed today as part of a comprehensive evaluation and provision of chronic care management and care coordination services.  SDOH (Social Determinants of Health) assessments and interventions performed:  SDOH Interventions    Flowsheet Row Most Recent Value  SDOH Interventions   Physical Activity Interventions Other (Comments)  [walking challenges. He gets fatigued sometimes when walking]  Stress Interventions Provide Counseling  [client has stress related to managing medical needs]  Depression Interventions/Treatment  Counseling        Advanced Directives Status: See Vynca application for related entries.  Care Plan  Allergies  Allergen Reactions   Claritin [Loratadine] Other (See Comments)    Heart race   Loratadine-Pseudoephedrine Er Palpitations    Pollen Extract Other (See Comments)    Seasonal allergies    Outpatient Encounter Medications as of 03/29/2022  Medication Sig   aspirin EC 81 MG tablet Take 81 mg by mouth daily. Swallow whole.   b complex vitamins capsule Take 1 capsule by mouth daily. With D3 gummie   betamethasone dipropionate 0.05 % cream Apply 1 application topically 2 (two) times daily as needed (Rash).   cetirizine (ZYRTEC) 10 MG tablet Take 10 mg by mouth daily.   clobetasol (TEMOVATE) 0.05 % external solution Apply 1 application. topically 2 (two) times daily. Use as directed.  Mix with cerave cream. Use for up to 4 weeks. Avoid applying to face, groin, and axilla. Use as directed.   clobetasol cream (TEMOVATE) 8.45 % Apply 1 application. topically 2 (two) times daily. Apply to aa of right ankle and left leg until itchy rash clear. Use prn when flared. Avoid applying to face, groin, and axilla. Use as directed.   clonazePAM (KLONOPIN) 0.5 MG tablet Take 0.5 mg by mouth daily as needed.   cyclobenzaprine (FLEXERIL) 5 MG tablet Take 1 tablet (5 mg total) by mouth 3 (three) times daily as needed for muscle spasms.   diphenhydrAMINE-zinc acetate (BENADRYL EXTRA STRENGTH) cream Apply 1 application topically 3 (three) times daily as needed for itching.   docusate sodium (COLACE) 100 MG capsule Take 100 mg by mouth 2 (two) times daily.   FLUoxetine (PROZAC) 20 MG capsule Take 1 capsule (20 mg total) by mouth daily.   fluticasone (FLONASE ALLERGY RELIEF) 50 MCG/ACT nasal spray Place 2 sprays into both nostrils daily as needed for allergies or rhinitis. (Patient taking differently: Place 2 sprays into both nostrils daily.)   guaiFENesin (MUCINEX) 600 MG 12 hr tablet Take  600 mg by mouth 2 (two) times daily as needed for to loosen phlegm.   hydrOXYzine (VISTARIL) 25 MG capsule Take 1 capsule (25 mg total) by mouth every 8 (eight) hours as needed. Take before bed as needed for itchy. Can cause drowsiness.   ketoconazole (NIZORAL)  2 % shampoo Apply 1 application. topically 3 (three) times a week.   ketoconazole (NIZORAL) 2 % shampoo Apply 1 application. topically daily as needed for irritation.   lubiprostone (AMITIZA) 8 MCG capsule Take 1 capsule (8 mcg total) by mouth 2 (two) times daily with a meal.   meclizine (ANTIVERT) 12.5 MG tablet Take 12.5 mg by mouth daily as needed for dizziness.   montelukast (SINGULAIR) 10 MG tablet Take 1 tablet (10 mg total) by mouth at bedtime.   omega-3 acid ethyl esters (LOVAZA) 1 g capsule Take 2 capsules (2 g total) by mouth 2 (two) times daily.   omeprazole (PRILOSEC) 40 MG capsule Take 1 capsule (40 mg total) by mouth 2 (two) times daily.   ondansetron (ZOFRAN) 4 MG tablet Take 1 tablet (4 mg total) by mouth every 6 (six) hours as needed for nausea.   Potassium 99 MG TABS Take 1 tablet by mouth daily.   propranolol (INDERAL) 20 MG tablet Take one tablet 20 mg  twice daily  may take an  additional dose of 20 mg  as needed for palpitations   triamcinolone cream (KENALOG) 0.1 % Apply 1 application. topically 2 (two) times daily.   No facility-administered encounter medications on file as of 03/29/2022.    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   [facility] spotted fever 12/31/2021   Encounter for examination following treatment at hospital 12/31/2021   Atypical angina ([place]) 12/10/2021   Anal itching 12/07/2021   Chronic sinusitis 12/04/2021   Mixed hyperlipidemia 11/30/2021   Esophageal hiatal hernia 11/22/2021   Irritable bowel syndrome with constipation 11/22/2021   Dyspnea on minimal exertion 40/98/1191   Ehrlichiosis 47/82/9562   Anxiety 06/26/2021   Eczema 06/26/2021   Barrett's esophagus  04/11/2021   History of colonic polyps 04/11/2021   Focal neurological deficit 02/23/2021   GERD (gastroesophageal reflux disease) 01/08/2018    Conditions to be addressed/monitored; Monitor client completion of ADLs. Monitor client management of depression issues  Care Plan : LCSW Care Plan  Updates made by Katha Cabal, LCSW since 03/29/2022 12:00 AM     Problem: Coping Skills (General Plan of Care)      Goal: Coping Skills Enhanced. Manage daily needs. Manage ADLs completion. Manage anxiety issues and depression issues   Start Date: 02/08/2022  Expected End Date: 06/03/2022  This Visit's Progress: Not on track  Recent Progress: Not on track  Priority: High  Note:   Current barriers:   Has tremors Has transport needs Increased appetite Fatigues easily   Clinical Goals:  LCSW to talk with client in next 30 days via phone or in person to discuss client coping skills for daily needs Client to attend scheduled medical appointments in next 30 days Client to communicate with RNCM as needed in next 30 days for nursing support LCSW to talk with client in next 30 days about client management of depression issues  Clinical Interventions:  Collaboration  with Lindell Spar, MD regarding development and update of comprehensive plan of care as evidenced by provider attestation and co-signature Assessment of needs, barriers of client  Discussed medication procurement with Adrienne Mocha Discussed employment of client. He has worked in Biomedical scientist, a very physical type of job.  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 Encouraged [name] to call RNCM as needed to discuss nursing needs of client Provided counseling support for client.  Client said he had used the weed killer Round Up in his job. 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 LCSW encouraged Shaheed to talk with Dr. Posey Pronto about client history of using Round Up weed killer.  Client said he was wondering if he needed to have blood test.  Encouraged client to please talk with Dr. Posey Pronto about these medical questions related to Round Up use.  Reviewed walking issues of client. Reviewed appetite of client. He said he is eating more recently Discussed memory issues. He said he is sometimes a little forgetful.  Encouraged Moiz to communicate as needed with RNCM to discuss nursing needs of client Discussed energy level of client. He said he fatigues easily  Patient Coping Skills:  Has support from his mother Has no transport needs Attends medical appointments  Patient Deficits  Tremors Depression issues  Patient Goals:  Attend medical appointments in next 30 days Patient will communicate with LCSW in next 30 days to discuss coping skills of client Patient will communicate as needed with RNCM to discuss nursing needs of client  Follow Up Plan: LCSW to call client on 05/07/22 at 2:00 PM      Norva Riffle.Dylyn Mclaren MSW, [place] Holiday representative [facility] Care Management [phone]

## 2022-04-01 DIAGNOSIS — S134XXA Sprain of ligaments of cervical spine, initial encounter: Secondary | ICD-10-CM | POA: Diagnosis not present

## 2022-04-01 DIAGNOSIS — S233XXA Sprain of ligaments of thoracic spine, initial encounter: Secondary | ICD-10-CM | POA: Diagnosis not present

## 2022-04-01 DIAGNOSIS — S338XXA Sprain of other parts of lumbar spine and pelvis, initial encounter: Secondary | ICD-10-CM | POA: Diagnosis not present

## 2022-04-01 DIAGNOSIS — M7061 Trochanteric bursitis, right hip: Secondary | ICD-10-CM | POA: Diagnosis not present

## 2022-04-01 DIAGNOSIS — M4004 Postural kyphosis, thoracic region: Secondary | ICD-10-CM | POA: Diagnosis not present

## 2022-04-09 DIAGNOSIS — M4004 Postural kyphosis, thoracic region: Secondary | ICD-10-CM | POA: Diagnosis not present

## 2022-04-09 DIAGNOSIS — S134XXA Sprain of ligaments of cervical spine, initial encounter: Secondary | ICD-10-CM | POA: Diagnosis not present

## 2022-04-09 DIAGNOSIS — S233XXA Sprain of ligaments of thoracic spine, initial encounter: Secondary | ICD-10-CM | POA: Diagnosis not present

## 2022-04-09 DIAGNOSIS — M7061 Trochanteric bursitis, right hip: Secondary | ICD-10-CM | POA: Diagnosis not present

## 2022-04-09 DIAGNOSIS — S338XXA Sprain of other parts of lumbar spine and pelvis, initial encounter: Secondary | ICD-10-CM | POA: Diagnosis not present

## 2022-04-15 DIAGNOSIS — S134XXA Sprain of ligaments of cervical spine, initial encounter: Secondary | ICD-10-CM | POA: Diagnosis not present

## 2022-04-15 DIAGNOSIS — M4004 Postural kyphosis, thoracic region: Secondary | ICD-10-CM | POA: Diagnosis not present

## 2022-04-15 DIAGNOSIS — S338XXA Sprain of other parts of lumbar spine and pelvis, initial encounter: Secondary | ICD-10-CM | POA: Diagnosis not present

## 2022-04-15 DIAGNOSIS — S233XXA Sprain of ligaments of thoracic spine, initial encounter: Secondary | ICD-10-CM | POA: Diagnosis not present

## 2022-04-15 DIAGNOSIS — M7061 Trochanteric bursitis, right hip: Secondary | ICD-10-CM | POA: Diagnosis not present

## 2022-04-16 ENCOUNTER — Encounter (INDEPENDENT_AMBULATORY_CARE_PROVIDER_SITE_OTHER): Payer: Self-pay | Admitting: Gastroenterology

## 2022-04-21 ENCOUNTER — Encounter: Payer: Self-pay | Admitting: Cardiology

## 2022-04-21 NOTE — Assessment & Plan Note (Signed)
No significant findings noted on monitor, but he clearly was having palpitation symptoms.  Things seem to have improved both the tremors and the palpitations while being on propranolol PRN.  Plan: We will change propranolol to standing dose of propranolol 10 mg twice daily.  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  Would probably not titrate further with resting heart rate of 56, but he feels much better on it.

## 2022-04-21 NOTE — Assessment & Plan Note (Signed)
Cardiac evaluation to date has been pretty much normal.  Low likelihood with his cholesterol panel and age that he would have microvascular disease leading to this [place] dyspnea.  Would look for noncardiac etiology.  I am getting continue the beta-blocker for his palpitations, and we will therefore monitor him on an annual basis.

## 2022-04-21 NOTE — Assessment & Plan Note (Addendum)
With minimal CAD on cardiac cath, lipids are pretty well controlled with just his triglycerides are elevated.  I would be reluctant to treat triglycerides until all of his other issues are resolved.  Would probably benefit from converting to Vascepa.  Needs dietary modification.

## 2022-04-22 DIAGNOSIS — S134XXA Sprain of ligaments of cervical spine, initial encounter: Secondary | ICD-10-CM | POA: Diagnosis not present

## 2022-04-22 DIAGNOSIS — S233XXA Sprain of ligaments of thoracic spine, initial encounter: Secondary | ICD-10-CM | POA: Diagnosis not present

## 2022-04-22 DIAGNOSIS — S338XXA Sprain of other parts of lumbar spine and pelvis, initial encounter: Secondary | ICD-10-CM | POA: Diagnosis not present

## 2022-04-22 DIAGNOSIS — M7061 Trochanteric bursitis, right hip: Secondary | ICD-10-CM | POA: Diagnosis not present

## 2022-04-22 DIAGNOSIS — M4004 Postural kyphosis, thoracic region: Secondary | ICD-10-CM | POA: Diagnosis not present

## 2022-04-24 ENCOUNTER — Telehealth: Payer: Self-pay

## 2022-04-24 ENCOUNTER — Ambulatory Visit: Payer: 59 | Admitting: Dermatology

## 2022-04-24 DIAGNOSIS — L219 Seborrheic dermatitis, unspecified: Secondary | ICD-10-CM

## 2022-04-24 DIAGNOSIS — L281 Prurigo nodularis: Secondary | ICD-10-CM

## 2022-04-24 DIAGNOSIS — L739 Follicular disorder, unspecified: Secondary | ICD-10-CM

## 2022-04-24 DIAGNOSIS — L209 Atopic dermatitis, unspecified: Secondary | ICD-10-CM | POA: Diagnosis not present

## 2022-04-24 MED ORDER — CLINDAMYCIN PHOSPHATE 1 % EX SOLN: CUTANEOUS | 3 refills | Status: DC

## 2022-04-24 NOTE — Progress Notes (Signed)
Follow-Up Visit   Subjective  Elijah Shaffer is a 41 y.o. male who presents for the following: Seborrheic Keratosis (4 week follow up, has improved while on ketoconazole shampoo ), Eczema (Patient has been using clobetasol with cerave mix. Still reports itchy areas at left leg//), and Other (Patient reports having painful bumps come up at groin and crease of left leg. He states that he has picked at them and believes they have gone away. ).  Reports hx of asthma and allergies    The following portions of the chart were reviewed this encounter and updated as appropriate:      The patient has spots, moles and lesions to be evaluated, some may be new or changing and the patient has concerns that these could be cancer.   Objective  Well appearing patient in no apparent distress; mood and affect are within normal limits.  A focused examination was performed including scalp, left leg, arms, chest. Relevant physical exam findings are noted in the Assessment and Plan.  Scalp Mild erythema/scale   left lower leg, left ankle, chest, back Skin clear with residual discoloration.  Pink scaly nodule L lat lower leg  left lateral lower leg Pink firm scaly nodule at left lateral lower leg  Left Inguinal Area Pr declined exam today   Assessment & Plan  Seborrheic dermatitis Scalp  Chronic and persistent condition with duration or expected duration over one year. Condition is symptomatic/ bothersome to patient. Improving with treatment.   Seborrheic Dermatitis  -  is a chronic persistent rash characterized by pinkness and scaling most commonly of the mid face but also can occur on the scalp (dandruff), ears; mid chest, mid back and groin.  It tends to be exacerbated by stress and cooler weather.  People who have neurologic disease may experience new onset or exacerbation of existing seborrheic dermatitis.  The condition is not curable but treatable and can be controlled.   Continue  Ketoconazole shampoo - apply daily prn for itchy scalp, massage into scalp and leave in for several minutes before rinsing out  Related Medications ketoconazole (NIZORAL) 2 % shampoo Apply 1 application. topically daily as needed for irritation.  Atopic dermatitis, unspecified type left lower leg, left ankle, chest, back  Chronic and persistent condition with duration or expected duration over one year. Condition is symptomatic/ bothersome to patient. Improving with treatment but not currently at goal.  Resistant area L lower leg c/w prurigo nodule   Atopic dermatitis (eczema) is a chronic, relapsing, pruritic condition that can significantly affect quality of life. It is often associated with allergic rhinitis and/or asthma and can require treatment with topical medications, phototherapy, or in severe cases biologic injectable medication (Dupixent; Adbry) or Oral JAK inhibitors.   Continue using mild soap and moisturizing cream 1-2 times daily.  Gentle skin care handout provided.    Continue Cerave cream with clobetasol sol. mixture bid to body prn itch, avoid f/g/a   Continue as needed for flare clobetasol cream bid to aas L lower leg until itchy rash cleared. Avoid f/g/a. Caution skin atrophy with long-term use.   Continue hydroxyzine 25 mg PO tid prn itch.     Prurigo nodularis left lateral lower leg  With pruritus/persistent scratching to area  Avoid scratching/digging or lesion will not clear, can use clobetasol cream to bid aa prn itch.   Injected Kenalog 10 mg    NDC 7893-8101-75  Lot [id number] Exp 06/2023  Intralesional steroid injection side effects were reviewed including  thinning of the skin and discoloration, such as redness, lightening or darkening.   Intralesional injection - left lateral lower leg Location: left lateral lower leg  Informed Consent: Discussed risks (infection, pain, bleeding, bruising, thinning of the skin, loss of skin pigment, lack of  resolution, and recurrence of lesion) and benefits of the procedure, as well as the alternatives. Informed consent was obtained. Preparation: The area was prepared a standard fashion.   Procedure Details: An intralesional injection was performed with Kenalog 10 mg/cc. 0.3 cc in total were injected.  Total number of injections: < 7  Plan: The patient was instructed on post-op care. Recommend OTC analgesia as needed for pain.  Kenalog 10   NDC [id number]  Lot [id number] Exp 12/2480   Folliculitis Left Inguinal Area  Vs inflamed cysts, pt states they have improved  Recommend over the counter Panoxyl 4 % creamy wash - apply topically to aa's, let sit few minutes, then rinse.    Start Clindamycin 1 % solution - apply topically qd to aa's of body for folliculitis     clindamycin (CLEOCIN T) 1 % external solution - Left Inguinal Area Apply topically to acne bumps at aa's qd   Return in about 6 weeks (around 06/05/2022) for follow up on seb derm/eczema/prurigo. I, Ruthell Rummage, CMA, am acting as scribe for [name], MD.  Documentation: I have reviewed the above documentation for accuracy and completeness, and I agree with the above.  [name] MD

## 2022-04-24 NOTE — Patient Instructions (Addendum)
For painful bumps at groin  Can be found over the counter in acne section  Start Panoxyl 4 % Creamy wash - apply topically to acne bumps let sit and rinse off   Benzoyl peroxide can cause dryness and irritation of the skin. It can also bleach fabric.   Start Clindamycin solution - apply topically to acne bumps at body qd    Due to recent changes in healthcare laws, you may see results of your pathology and/or laboratory studies on MyChart before the doctors have had a chance to review them. We understand that in some cases there may be results that are confusing or concerning to you. Please understand that not all results are received at the same time and often the doctors may need to interpret multiple results in order to provide you with the best plan of care or course of treatment. Therefore, we ask that you please give [place] 2 business days to thoroughly review all your results before contacting the office for clarification. Should we see a critical lab result, you will be contacted sooner.   If You Need Anything After Your Visit  If you have any questions or concerns for your doctor, please call our main line at [phone] and press option 4 to reach your doctor's medical assistant. If no one answers, please leave a voicemail as directed and we will return your call as soon as possible. Messages left after 4 pm will be answered the following business day.   You may also send [place] a message via [place]. We typically respond to MyChart messages within 1-2 business days.  For prescription refills, please ask your pharmacy to contact our office. Our fax number is [phone].  If you have an urgent issue when the clinic is closed that cannot wait until the next business day, you can page your doctor at the number below.    Please note that while we do our best to be available for urgent issues outside of office hours, we are not available 24/7.   If you have an urgent issue and are unable to  reach [place], you may choose to seek medical care at your doctor's office, retail clinic, urgent care center, or emergency room.  If you have a medical emergency, please immediately call 911 or go to the emergency department.  Pager Numbers  - Dr. Nehemiah Massed: [phone]  - Dr. Laurence Ferrari: [phone]  - Dr. Nicole Kindred: [phone]  In the event of inclement weather, please call our main line at [phone] for an update on the status of any delays or closures.  Dermatology Medication Tips: Please keep the boxes that topical medications come in in order to help keep track of the instructions about where and how to use these. Pharmacies typically print the medication instructions only on the boxes and not directly on the medication tubes.   If your medication is too expensive, please contact our office at [phone] option 4 or send [place] a message through [place].   We are unable to tell what your co-pay for medications will be in advance as this is different depending on your insurance coverage. However, we may be able to find a substitute medication at lower cost or fill out paperwork to get insurance to cover a needed medication.   If a prior authorization is required to get your medication covered by your insurance company, please allow [place] 1-2 business days to complete this process.  Drug prices often vary depending on where the prescription is filled and  some pharmacies may offer cheaper prices.  The website www.goodrx.com contains coupons for medications through different pharmacies. The prices here do not account for what the cost may be with help from insurance (it may be cheaper with your insurance), but the website can give you the price if you did not use any insurance.  - You can print the associated coupon and take it with your prescription to the pharmacy.  - You may also stop by our office during regular business hours and pick up a GoodRx coupon card.  - If you need your prescription  sent electronically to a different pharmacy, notify our office through [facility] or by phone at [phone] option 4.     Si Usted Necesita Algo Despus de Su Visita  Tambin puede enviarnos un mensaje a travs de Pharmacist, community. Por lo general respondemos a los mensajes de MyChart en el transcurso de 1 a 2 das hbiles.  Para renovar recetas, por favor pida a su farmacia que se ponga en contacto con nuestra oficina. Harland Dingwall de fax es [place] [phone].  Si tiene un asunto urgente cuando la clnica est cerrada y que no puede esperar hasta el siguiente da hbil, puede llamar/localizar a su doctor(a) al nmero que aparece a continuacin.   Por favor, tenga en cuenta que aunque hacemos todo lo posible para estar disponibles para asuntos urgentes fuera del horario de [place], no estamos disponibles las 24 horas del da, los 7 das de la [place].   Si tiene un problema urgente y no puede comunicarse con nosotros, puede optar por buscar atencin mdica  en el consultorio de su doctor(a), en una clnica privada, en un centro de atencin urgente o en una sala de emergencias.  Si tiene Engineering geologist, por favor llame inmediatamente al 911 o vaya a la sala de emergencias.  Nmeros de bper  - Dr. Nehemiah Massed: [phone]  - Dra. Moye: [phone]  - Dra. Nicole Kindred: [phone]  En caso de inclemencias del [place], por favor llame a Johnsie Kindred principal al [phone] para una actualizacin sobre el [place] de cualquier retraso o cierre.  Consejos para la medicacin en dermatologa: Por favor, guarde las cajas en las que vienen los medicamentos de uso tpico para ayudarle a seguir las instrucciones sobre dnde y cmo usarlos. Las farmacias generalmente imprimen las instrucciones del medicamento slo en las cajas y no directamente en los tubos del [place].   Si su medicamento es muy caro, por favor, pngase en contacto con Zigmund Daniel llamando al [phone] y presione  la opcin 4 o envenos un mensaje a travs de Pharmacist, community.   No podemos decirle cul ser su copago por los medicamentos por adelantado ya que esto es diferente dependiendo de la cobertura de su seguro. Sin embargo, es posible que podamos encontrar un medicamento sustituto a Electrical engineer un formulario para que el seguro cubra el medicamento que se considera necesario.   Si se requiere una autorizacin previa para que su compaa de seguros [place] su medicamento, por favor permtanos de 1 a 2 das hbiles para completar este proceso.  Los precios de los medicamentos varan con frecuencia dependiendo del Environmental consultant de dnde se surte la receta y alguna farmacias pueden ofrecer precios ms baratos.  El sitio web www.goodrx.com tiene cupones para medicamentos de Airline pilot. Los precios aqu no tienen en cuenta lo que podra costar con la ayuda del seguro (puede ser ms barato con su seguro), pero el sitio web puede darle el precio  si no utiliz [employer].  - Puede imprimir el cupn correspondiente y llevarlo con su receta a la farmacia.  - Tambin puede pasar por nuestra oficina durante el horario de atencin regular y Charity fundraiser una tarjeta de cupones de GoodRx.  - Si necesita que su receta se enve electrnicamente a una farmacia diferente, informe a nuestra oficina a travs de MyChart de [place] o por telfono llamando al [phone] y presione la opcin 4.

## 2022-04-24 NOTE — Telephone Encounter (Signed)
Tried calling patient regarding recent  office visit today. Dr. Nicole Kindred requested, I call patient to go over how to use triamcinolone and cerave cream mix. He should only use as needed to itchy areas. If he is no longer itchy discontinue using and use cerave cream daily for dry skin.   Patient did not answer. Left message for pt to call office.

## 2022-04-25 NOTE — Telephone Encounter (Signed)
Discussed with pt how to use triamcinolone and cerave cream mix. He should only use as needed to itchy areas. If he is no longer itchy discontinue using and use cerave cream daily for dry skin.

## 2022-04-29 DIAGNOSIS — S134XXA Sprain of ligaments of cervical spine, initial encounter: Secondary | ICD-10-CM | POA: Diagnosis not present

## 2022-04-29 DIAGNOSIS — S338XXA Sprain of other parts of lumbar spine and pelvis, initial encounter: Secondary | ICD-10-CM | POA: Diagnosis not present

## 2022-04-29 DIAGNOSIS — M7061 Trochanteric bursitis, right hip: Secondary | ICD-10-CM | POA: Diagnosis not present

## 2022-04-29 DIAGNOSIS — S233XXA Sprain of ligaments of thoracic spine, initial encounter: Secondary | ICD-10-CM | POA: Diagnosis not present

## 2022-04-29 DIAGNOSIS — M4004 Postural kyphosis, thoracic region: Secondary | ICD-10-CM | POA: Diagnosis not present

## 2022-05-07 ENCOUNTER — Ambulatory Visit: Payer: 59 | Admitting: Licensed Clinical Social Worker

## 2022-05-07 DIAGNOSIS — E782 Mixed hyperlipidemia: Secondary | ICD-10-CM

## 2022-05-07 DIAGNOSIS — A770 Spotted fever due to Rickettsia rickettsii: Secondary | ICD-10-CM

## 2022-05-07 DIAGNOSIS — L309 Dermatitis, unspecified: Secondary | ICD-10-CM

## 2022-05-07 DIAGNOSIS — F419 Anxiety disorder, unspecified: Secondary | ICD-10-CM

## 2022-05-07 DIAGNOSIS — K219 Gastro-esophageal reflux disease without esophagitis: Secondary | ICD-10-CM

## 2022-05-07 DIAGNOSIS — R42 Dizziness and giddiness: Secondary | ICD-10-CM

## 2022-05-07 DIAGNOSIS — R0609 Other forms of dyspnea: Secondary | ICD-10-CM

## 2022-05-07 NOTE — Chronic Care Management (AMB) (Signed)
Care Management Clinical Social Work Note  05/07/2022 Name: Elijah Shaffer MRN: [id number] DOB: [birth year]  Elijah Shaffer is a 41 y.o. year old male who is a primary care patient of Anabel Halon, MD.  The Care Management team was consulted for assistance with chronic disease management and coordination needs.  Engaged with patient by telephone for follow up visit in response to provider referral for social work chronic care management and care coordination services  Consent to Services:  Mr. Maga was given information about Care Management services today including:  Care Management services includes personalized support from designated clinical staff supervised by his physician, including individualized plan of care and coordination with other care providers 24/7 contact phone numbers for assistance for urgent and routine care needs. The patient may stop case management services at any time by phone call to the office staff.  Patient agreed to services and consent obtained.   Assessment: Review of patient past medical history, allergies, medications, and health status, including review of relevant consultants reports was performed today as part of a comprehensive evaluation and provision of chronic care management and care coordination services.  SDOH (Social Determinants of Health) assessments and interventions performed:  SDOH Interventions    Flowsheet Row Most Recent Value  SDOH Interventions   Stress Interventions Provide Counseling  [client has stress related to managing medical needs]  Depression Interventions/Treatment  Counseling, Medication        Advanced Directives Status: See Vynca application for related entries.  Care Plan  Allergies  Allergen Reactions   Claritin [Loratadine] Other (See Comments)    Heart race   Loratadine-Pseudoephedrine Er Palpitations   Pollen Extract Other (See Comments)    Seasonal allergies    Outpatient Encounter Medications as  of 05/07/2022  Medication Sig   aspirin EC 81 MG tablet Take 81 mg by mouth daily. Swallow whole.   b complex vitamins capsule Take 1 capsule by mouth daily. With D3 gummie   betamethasone dipropionate 0.05 % cream Apply 1 application topically 2 (two) times daily as needed (Rash).   cetirizine (ZYRTEC) 10 MG tablet Take 10 mg by mouth daily.   clindamycin (CLEOCIN T) 1 % external solution Apply topically to acne bumps at aa's qd   clobetasol (TEMOVATE) 0.05 % external solution Apply 1 application. topically 2 (two) times daily. Use as directed.  Mix with cerave cream. Use for up to 4 weeks. Avoid applying to face, groin, and axilla. Use as directed.   clobetasol cream (TEMOVATE) 0.05 % Apply 1 application. topically 2 (two) times daily. Apply to aa of right ankle and left leg until itchy rash clear. Use prn when flared. Avoid applying to face, groin, and axilla. Use as directed.   clonazePAM (KLONOPIN) 0.5 MG tablet Take 0.5 mg by mouth daily as needed.   cyclobenzaprine (FLEXERIL) 5 MG tablet Take 1 tablet (5 mg total) by mouth 3 (three) times daily as needed for muscle spasms.   diphenhydrAMINE-zinc acetate (BENADRYL EXTRA STRENGTH) cream Apply 1 application topically 3 (three) times daily as needed for itching.   docusate sodium (COLACE) 100 MG capsule Take 100 mg by mouth 2 (two) times daily.   FLUoxetine (PROZAC) 20 MG capsule Take 1 capsule (20 mg total) by mouth daily.   fluticasone (FLONASE ALLERGY RELIEF) 50 MCG/ACT nasal spray Place 2 sprays into both nostrils daily as needed for allergies or rhinitis. (Patient taking differently: Place 2 sprays into both nostrils daily.)   guaiFENesin (MUCINEX) 600 MG 12  hr tablet Take 600 mg by mouth 2 (two) times daily as needed for to loosen phlegm.   hydrOXYzine (VISTARIL) 25 MG capsule Take 1 capsule (25 mg total) by mouth every 8 (eight) hours as needed. Take before bed as needed for itchy. Can cause drowsiness.   ketoconazole (NIZORAL) 2 % shampoo  Apply 1 application. topically 3 (three) times a week.   ketoconazole (NIZORAL) 2 % shampoo Apply 1 application. topically daily as needed for irritation.   lubiprostone (AMITIZA) 8 MCG capsule Take 1 capsule (8 mcg total) by mouth 2 (two) times daily with a meal.   meclizine (ANTIVERT) 12.5 MG tablet Take 12.5 mg by mouth daily as needed for dizziness.   montelukast (SINGULAIR) 10 MG tablet Take 1 tablet (10 mg total) by mouth at bedtime.   omega-3 acid ethyl esters (LOVAZA) 1 g capsule Take 2 capsules (2 g total) by mouth 2 (two) times daily.   omeprazole (PRILOSEC) 40 MG capsule Take 1 capsule (40 mg total) by mouth 2 (two) times daily.   ondansetron (ZOFRAN) 4 MG tablet Take 1 tablet (4 mg total) by mouth every 6 (six) hours as needed for nausea.   Potassium 99 MG TABS Take 1 tablet by mouth daily.   propranolol (INDERAL) 20 MG tablet Take one tablet 20 mg  twice daily  may take an  additional dose of 20 mg  as needed for palpitations   triamcinolone cream (KENALOG) 0.1 % Apply 1 application. topically 2 (two) times daily.   No facility-administered encounter medications on file as of 05/07/2022.    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   [facility] spotted fever 12/31/2021   Encounter for examination following treatment at hospital 12/31/2021   Atypical angina (HCC) 12/10/2021   Anal itching 12/07/2021   Chronic sinusitis 12/04/2021   Mixed hyperlipidemia 11/30/2021   Esophageal hiatal hernia 11/22/2021   Irritable bowel syndrome with constipation 11/22/2021   Dyspnea on minimal exertion 06/28/2021   Ehrlichiosis 06/26/2021   Anxiety 06/26/2021   Eczema 06/26/2021   Barrett's esophagus 04/11/2021    History of colonic polyps 04/11/2021   Focal neurological deficit 02/23/2021   GERD (gastroesophageal reflux disease) 01/08/2018    Conditions to be addressed/monitored: monitor client management of daily tasks. Monitor client management of depression issues  Care Plan : LCSW Care Plan  Updates made by Isaiah Blakes, LCSW since 05/07/2022 12:00 AM     Problem: Coping Skills (General Plan of Care)      Goal: Coping Skills Enhanced. Manage daily needs. Manage ADLs completion. Manage anxiety issues and depression issues   Start Date: 02/08/2022  Expected End Date: 08/05/2022  This Visit's Progress: Not on track  Recent Progress: Not on track  Priority: Medium  Note:   Current barriers:   Has tremors Some difficulty with job tasks Memory issues Fatigues easily   Clinical Goals:  LCSW to talk with client in next 30 days via phone or in person to discuss client coping skills for daily needs Client to attend scheduled medical appointments in next 30 days Client to communicate with RNCM as needed in next 30 days for nursing support LCSW to talk with client in next 30 days about client management of depression  issues  Clinical Interventions:  Collaboration with Anabel Halon, MD regarding development and update of comprehensive plan of care as evidenced by provider attestation and co-signature Assessment of needs, barriers of client  Discussed medication procurement with Jeri Cos Discussed employment of client. He has worked in Aeronautical engineer, a very physical type of job.  He said he is working but has to pace himself and not overwork. He fatigues easily.  He is trying to allow time for rest and relaxation Encouraged Ann to call RNCM as needed to discuss nursing needs of client Provided counseling support for client.  Client reported that he is scheduled to have a blood test this Thursday. He is concerned over past use of chemical Roundup. He is scheduled to have blood test this  Thursday. He also said he is scheduled to have a appointment with a neurologist this Friday in [place], [place] Discussed memory challenges of client Reviewed sleeping issues of client. Discussed appetite of client Discussed energy level of client. He said he fatigues easily Reviewed transport needs. He said he is driving as needed. He did not mention any driving issues Reviewed mood of client. He said he thought his mood was stable. He is taking medications as prescribed. He did not mention any mood problems  Patient Coping Skills:  Has support from his mother Has no transport needs Attends medical appointments  Patient Deficits  Tremors Depression issues Memory issues  Patient Goals:  Attend medical appointments in next 30 days Patient will communicate with LCSW in next 30 days to discuss coping skills of client Patient will communicate as needed with RNCM to discuss nursing needs of client  Follow Up Plan: LCSW to call client on 06/11/22 at 9:00 AM      Kelton Pillar.Pierrette Scheu MSW, LCSW Licensed Visual merchandiser [facility] Care Management [phone]

## 2022-05-08 DIAGNOSIS — S338XXA Sprain of other parts of lumbar spine and pelvis, initial encounter: Secondary | ICD-10-CM | POA: Diagnosis not present

## 2022-05-08 DIAGNOSIS — M7061 Trochanteric bursitis, right hip: Secondary | ICD-10-CM | POA: Diagnosis not present

## 2022-05-08 DIAGNOSIS — S134XXA Sprain of ligaments of cervical spine, initial encounter: Secondary | ICD-10-CM | POA: Diagnosis not present

## 2022-05-08 DIAGNOSIS — M4004 Postural kyphosis, thoracic region: Secondary | ICD-10-CM | POA: Diagnosis not present

## 2022-05-08 DIAGNOSIS — S233XXA Sprain of ligaments of thoracic spine, initial encounter: Secondary | ICD-10-CM | POA: Diagnosis not present

## 2022-05-09 ENCOUNTER — Ambulatory Visit: Payer: 59 | Admitting: Internal Medicine

## 2022-05-10 DIAGNOSIS — G629 Polyneuropathy, unspecified: Secondary | ICD-10-CM | POA: Diagnosis not present

## 2022-05-15 DIAGNOSIS — M7061 Trochanteric bursitis, right hip: Secondary | ICD-10-CM | POA: Diagnosis not present

## 2022-05-15 DIAGNOSIS — S338XXA Sprain of other parts of lumbar spine and pelvis, initial encounter: Secondary | ICD-10-CM | POA: Diagnosis not present

## 2022-05-15 DIAGNOSIS — S233XXA Sprain of ligaments of thoracic spine, initial encounter: Secondary | ICD-10-CM | POA: Diagnosis not present

## 2022-05-15 DIAGNOSIS — S134XXA Sprain of ligaments of cervical spine, initial encounter: Secondary | ICD-10-CM | POA: Diagnosis not present

## 2022-05-15 DIAGNOSIS — M4004 Postural kyphosis, thoracic region: Secondary | ICD-10-CM | POA: Diagnosis not present

## 2022-05-22 DIAGNOSIS — S338XXA Sprain of other parts of lumbar spine and pelvis, initial encounter: Secondary | ICD-10-CM | POA: Diagnosis not present

## 2022-05-22 DIAGNOSIS — S233XXA Sprain of ligaments of thoracic spine, initial encounter: Secondary | ICD-10-CM | POA: Diagnosis not present

## 2022-05-22 DIAGNOSIS — S134XXA Sprain of ligaments of cervical spine, initial encounter: Secondary | ICD-10-CM | POA: Diagnosis not present

## 2022-05-22 DIAGNOSIS — M4004 Postural kyphosis, thoracic region: Secondary | ICD-10-CM | POA: Diagnosis not present

## 2022-05-22 DIAGNOSIS — M7061 Trochanteric bursitis, right hip: Secondary | ICD-10-CM | POA: Diagnosis not present

## 2022-05-27 DIAGNOSIS — S233XXA Sprain of ligaments of thoracic spine, initial encounter: Secondary | ICD-10-CM | POA: Diagnosis not present

## 2022-05-27 DIAGNOSIS — M7061 Trochanteric bursitis, right hip: Secondary | ICD-10-CM | POA: Diagnosis not present

## 2022-05-27 DIAGNOSIS — S134XXA Sprain of ligaments of cervical spine, initial encounter: Secondary | ICD-10-CM | POA: Diagnosis not present

## 2022-05-27 DIAGNOSIS — S338XXA Sprain of other parts of lumbar spine and pelvis, initial encounter: Secondary | ICD-10-CM | POA: Diagnosis not present

## 2022-05-27 DIAGNOSIS — M4004 Postural kyphosis, thoracic region: Secondary | ICD-10-CM | POA: Diagnosis not present

## 2022-05-29 ENCOUNTER — Ambulatory Visit (INDEPENDENT_AMBULATORY_CARE_PROVIDER_SITE_OTHER): Payer: 59 | Admitting: Internal Medicine

## 2022-05-29 ENCOUNTER — Encounter: Payer: Self-pay | Admitting: Internal Medicine

## 2022-05-29 VITALS — BP 134/77 | HR 79 | Ht 74.5 in | Wt 258.8 lb

## 2022-05-29 DIAGNOSIS — R0609 Other forms of dyspnea: Secondary | ICD-10-CM

## 2022-05-29 DIAGNOSIS — E782 Mixed hyperlipidemia: Secondary | ICD-10-CM | POA: Diagnosis not present

## 2022-05-29 DIAGNOSIS — I471 Supraventricular tachycardia: Secondary | ICD-10-CM

## 2022-05-29 DIAGNOSIS — F419 Anxiety disorder, unspecified: Secondary | ICD-10-CM

## 2022-05-29 DIAGNOSIS — M5441 Lumbago with sciatica, right side: Secondary | ICD-10-CM

## 2022-05-29 DIAGNOSIS — M5442 Lumbago with sciatica, left side: Secondary | ICD-10-CM | POA: Diagnosis not present

## 2022-05-29 DIAGNOSIS — R69 Illness, unspecified: Secondary | ICD-10-CM | POA: Diagnosis not present

## 2022-05-29 DIAGNOSIS — G8929 Other chronic pain: Secondary | ICD-10-CM

## 2022-05-29 DIAGNOSIS — F444 Conversion disorder with motor symptom or deficit: Secondary | ICD-10-CM

## 2022-05-29 MED ORDER — CYCLOBENZAPRINE HCL 5 MG PO TABS: 5.0000 mg | ORAL_TABLET | Freq: Every evening | ORAL | 1 refills | Status: DC | PRN

## 2022-05-29 MED ORDER — FLUOXETINE HCL 20 MG PO CAPS: 20.0000 mg | ORAL_CAPSULE | Freq: Every day | ORAL | 1 refills | Status: DC

## 2022-05-29 NOTE — Progress Notes (Signed)
Established Patient Office Visit  Subjective:  Patient ID: Elijah Shaffer, male    DOB: 04/11/1981  Age: 41 y.o. MRN: [id number]  CC:  Chief Complaint  Patient presents with   Follow-up    6 month follow up, also c/o trouble sleeping from muscle spasms/lower back pain.    HPI Elijah Shaffer is a 41 y.o. male with past medical history of GERD, eczema, anxiety and chronic dyspnea who presents for f/u of his chronic medical conditions.  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  He complains of low back pain and muscle spasms after working.  He also reports chronic fatigue and dyspnea on exertion.  He has had extensive pulmonology and cardiac work-up, including PFTs, echo, left and right heart cath, which have not been able to show reason behind his dyspnea.  He also has been seen by rheumatology and neurology for chronic fatigue and weakness of the extremities.  He has a history of GERD and Barrett's esophagus, for which he takes Protonix.  He has been taking Prozac for anxiety.  He has improvement in his spells of anxiety with Prozac now.  Denies any SI or HI currently.  Past Medical History:  Diagnosis Date   Acute renal injury (HCC) 06/28/2017   Anxiety    Aspiration pneumonia of right upper lobe due to gastric secretions (HCC)    Diverticulitis    GERD (gastroesophageal reflux disease) 01/08/2018   Seasonal allergies    Syncope 06/26/2021    Past Surgical History:  Procedure Laterality Date   BIOPSY  03/18/2021   Procedure: BIOPSY;  Surgeon: Dolores Frame, MD;  [facility]: [facility];  Service: Gastroenterology;;  esophageal at Z-line   BIOPSY  05/23/2021   Procedure: BIOPSY;  Surgeon: Dolores Frame, MD;  [facility]: [facility];  Service: Gastroenterology;;   CARDIOPULMONARY EXERCISE TEST (CPX)  01/09/2022   Interpretation limited by submaximal effort.  Severe functional impairment when compared to max sedentary norms.  No cardiopulmonary  limitations.  Noted tremor and generalized weakness that lead to premature exercise termination-consider neuromuscular evaluation.   COLONOSCOPY N/A 09/22/2017   Procedure: COLONOSCOPY;  Surgeon: Malissa Hippo, MD;  Location: [facility];  Service: Endoscopy;  Laterality: N/A;  9:55   COLONOSCOPY WITH PROPOFOL N/A 05/23/2021   Procedure: COLONOSCOPY WITH PROPOFOL;  Surgeon: Dolores Frame, MD;  [facility]: [facility];  Service: Gastroenterology;  Laterality: N/A;  7:30   ESOPHAGOGASTRODUODENOSCOPY (EGD) WITH PROPOFOL N/A 03/18/2021   Procedure: ESOPHAGOGASTRODUODENOSCOPY (EGD) WITH PROPOFOL;  Surgeon: Dolores Frame, MD;  [facility]: [facility];  Service: Gastroenterology;  Laterality: N/A;   ESOPHAGOGASTRODUODENOSCOPY (EGD) WITH PROPOFOL N/A 05/23/2021   Procedure: ESOPHAGOGASTRODUODENOSCOPY (EGD) WITH PROPOFOL;  Surgeon: Dolores Frame, MD;  [facility]: [facility];  Service: Gastroenterology;  Laterality: N/A;   POLYPECTOMY  09/22/2017   Procedure: POLYPECTOMY;  Surgeon: Malissa Hippo, MD;  [facility]: [facility];  Service: Endoscopy;;   RIGHT/LEFT HEART CATH AND CORONARY ANGIOGRAPHY N/A 12/12/2021   Procedure: RIGHT/LEFT HEART CATH AND CORONARY ANGIOGRAPHY;  Surgeon: Dolores Patty, MD;  [facility];  Service: Cardiovascular:: Normal Coronaries & Hemodynamics.  EF 50-55%. RA = 4 mmHg, RV = 23/4; PA = 22/6 (14) & PCW = 7; Ao sat = 95%; PA sat = 76%, 76%& SVC sat = 74%Fick cardiac output/index = 7.1/3.0; PVR = 1.0 WU   TRANSTHORACIC ECHOCARDIOGRAM  02/24/2021   EF 60 to 65%.  Normal LV  function.  Normal valves.  Mild RV dilation with normal function.   ZIO PATCH EVENT MONITOR  01/2022   Sinus rhythm with heart rate range 24 to 159 bpm, 1  AVB and Wenckebach block noted along with rare PACs and PVCs.  No arrhythmias.    Family History  Problem Relation Age of Onset   Healthy Mother    Cancer Father    Colon cancer Maternal  Grandmother    Colon cancer Maternal Grandfather     Social History   Socioeconomic History   Marital status: Single    Spouse name: Not on file   Number of children: Not on file   Years of education: Not on file   Highest education level: Not on file  Occupational History   Not on file  Tobacco Use   Smoking status: Never    Passive exposure: Past   Smokeless tobacco: Never  Vaping Use   Vaping Use: Never used  Substance and Sexual Activity   Alcohol use: Yes    Comment: occ   Drug use: No   Sexual activity: Not on file  Other Topics Concern   Not on file  Social History Narrative   Not on file   Social Determinants of Health   Financial Resource Strain: Not on file  Food Insecurity: Not on file  Transportation Needs: Not on file  Physical Activity: Insufficiently Active (03/29/2022)   Exercise Vital Sign    Days of Exercise per Week: 2 days    Minutes of Exercise per Session: 10 min  Stress: Stress Concern Present (05/07/2022)   [place]    Feeling of Stress : To some extent  Social Connections: Not on file  Intimate Partner Violence: Not on file    Outpatient Medications Prior to Visit  Medication Sig Dispense Refill   aspirin EC 81 MG tablet Take 81 mg by mouth daily. Swallow whole.     b complex vitamins capsule Take 1 capsule by mouth daily. With D3 gummie     cetirizine (ZYRTEC) 10 MG tablet Take 10 mg by mouth daily.     clindamycin (CLEOCIN T) 1 % external solution Apply topically to acne bumps at aa's qd 30 mL 3   clobetasol (TEMOVATE) 0.05 % external solution Apply 1 application. topically 2 (two) times daily. Use as directed.  Mix with cerave cream. Use for up to 4 weeks. Avoid applying to face, groin, and axilla. Use as directed. 50 mL 1   diphenhydrAMINE-zinc acetate (BENADRYL EXTRA STRENGTH) cream Apply 1 application topically 3 (three) times daily as needed for itching. 28.4 g 0    docusate sodium (COLACE) 100 MG capsule Take 100 mg by mouth 2 (two) times daily.     fluticasone (FLONASE ALLERGY RELIEF) 50 MCG/ACT nasal spray Place 2 sprays into both nostrils daily as needed for allergies or rhinitis. (Patient taking differently: Place 2 sprays into both nostrils daily.) 9.9 mL 2   guaiFENesin (MUCINEX) 600 MG 12 hr tablet Take 600 mg by mouth 2 (two) times daily as needed for to loosen phlegm.     hydrOXYzine (VISTARIL) 25 MG capsule Take 1 capsule (25 mg total) by mouth every 8 (eight) hours as needed. Take before bed as needed for itchy. Can cause drowsiness. 60 capsule 2   ketoconazole (NIZORAL) 2 % shampoo Apply 1 application. topically daily as needed for irritation. 120 mL 11   lubiprostone (AMITIZA) 8 MCG capsule Take 1 capsule (  8 mcg total) by mouth 2 (two) times daily with a meal. 180 capsule 1   meclizine (ANTIVERT) 12.5 MG tablet Take 12.5 mg by mouth daily as needed for dizziness.     montelukast (SINGULAIR) 10 MG tablet Take 1 tablet (10 mg total) by mouth at bedtime. 30 tablet 11   omega-3 acid ethyl esters (LOVAZA) 1 g capsule Take 2 capsules (2 g total) by mouth 2 (two) times daily. 120 capsule 3   omeprazole (PRILOSEC) 40 MG capsule Take 1 capsule (40 mg total) by mouth 2 (two) times daily. 180 capsule 3   ondansetron (ZOFRAN) 4 MG tablet Take 1 tablet (4 mg total) by mouth every 6 (six) hours as needed for nausea. 20 tablet 0   Potassium 99 MG TABS Take 1 tablet by mouth daily.     propranolol (INDERAL) 20 MG tablet Take one tablet 20 mg  twice daily  may take an  additional dose of 20 mg  as needed for palpitations 190 tablet 3   triamcinolone cream (KENALOG) 0.1 % Apply 1 application. topically 2 (two) times daily. 30 g 0   betamethasone dipropionate 0.05 % cream Apply 1 application topically 2 (two) times daily as needed (Rash).     clobetasol cream (TEMOVATE) 0.05 % Apply 1 application. topically 2 (two) times daily. Apply to aa of right ankle and left leg  until itchy rash clear. Use prn when flared. Avoid applying to face, groin, and axilla. Use as directed. 30 g 0   clonazePAM (KLONOPIN) 0.5 MG tablet Take 0.5 mg by mouth daily as needed.     cyclobenzaprine (FLEXERIL) 5 MG tablet Take 1 tablet (5 mg total) by mouth 3 (three) times daily as needed for muscle spasms. 30 tablet 1   FLUoxetine (PROZAC) 20 MG capsule Take 1 capsule (20 mg total) by mouth daily. 90 capsule 1   ketoconazole (NIZORAL) 2 % shampoo Apply 1 application. topically 3 (three) times a week. 120 mL 0   No facility-administered medications prior to visit.    Allergies  Allergen Reactions   Claritin [Loratadine] Other (See Comments)    Heart race   Loratadine-Pseudoephedrine Er Palpitations   Pollen Extract Other (See Comments)    Seasonal allergies    ROS Review of Systems  Constitutional:  Positive for fatigue. Negative for chills and fever.  HENT:  Negative for congestion and sore throat.   Eyes:  Negative for pain and discharge.  Respiratory:  Positive for shortness of breath. Negative for cough.   Cardiovascular:  Positive for palpitations. Negative for chest pain.  Gastrointestinal:  Negative for diarrhea, nausea and vomiting.  Endocrine: Negative for polydipsia and polyuria.  Genitourinary:  Negative for dysuria and hematuria.  Musculoskeletal:  Positive for arthralgias, back pain and myalgias. Negative for neck pain and neck stiffness.  Neurological:  Positive for dizziness and tremors. Negative for headaches.  Psychiatric/Behavioral:  Positive for sleep disturbance. Negative for agitation and behavioral problems. The patient is nervous/anxious.       Objective:    Physical Exam Vitals reviewed.  Constitutional:      General: He is not in acute distress.    Appearance: He is not diaphoretic.  HENT:     Head: Normocephalic and atraumatic.     Nose: Nose normal.     Mouth/Throat:     Mouth: Mucous membranes are moist.  Eyes:     General: No  scleral icterus.    Extraocular Movements: Extraocular movements intact.  Cardiovascular:  Rate and Rhythm: Normal rate and regular rhythm.     Pulses: Normal pulses.     Heart sounds: Normal heart sounds. No murmur heard. Pulmonary:     Breath sounds: Normal breath sounds. No wheezing or rales.  Musculoskeletal:     Cervical back: Neck supple. No tenderness.     Right lower leg: No edema.     Left lower leg: No edema.  Skin:    General: Skin is warm.     Coloration: Skin is not jaundiced.  Neurological:     General: No focal deficit present.     Mental Status: He is alert and oriented to person, place, and time.     Comments: Functional tremors on right hand  Psychiatric:        Mood and Affect: Mood is anxious and depressed. Affect is flat.        Thought Content: Thought content does not include homicidal or suicidal ideation.     BP 134/77   Pulse 79   Ht 6' 2.5" (1.892 m)   Wt 258 lb 12.8 oz (117.4 kg)   SpO2 95%   BMI 32.78 kg/m  Wt Readings from Last 3 Encounters:  05/29/22 258 lb 12.8 oz (117.4 kg)  03/25/22 261 lb 12.8 oz (118.8 kg)  03/13/22 263 lb (119.3 kg)    Lab Results  Component Value Date   TSH 1.440 07/18/2021   Lab Results  Component Value Date   WBC 8.2 12/27/2021   HGB 16.7 12/27/2021   HCT 49.8 12/27/2021   MCV 87.7 12/27/2021   PLT 257 12/27/2021   Lab Results  Component Value Date   NA 134 (L) 12/27/2021   K 3.8 12/27/2021   CO2 20 (L) 12/27/2021   GLUCOSE 83 12/27/2021   BUN 19 12/27/2021   CREATININE 1.31 (H) 12/27/2021   BILITOT 0.5 11/22/2021   ALKPHOS 81 11/22/2021   AST 45 (H) 11/22/2021   ALT 75 (H) 11/22/2021   PROT 7.3 11/22/2021   ALBUMIN 4.9 02/06/2022   CALCIUM 9.2 12/27/2021   ANIONGAP 10 12/27/2021   EGFR 68 12/10/2021   GFR 68.05 10/02/2021   Lab Results  Component Value Date   CHOL 122 11/22/2021   Lab Results  Component Value Date   HDL 31 (L) 11/22/2021   Lab Results  Component Value Date    LDLCALC 25 11/22/2021   Lab Results  Component Value Date   TRIG 481 (H) 11/22/2021   Lab Results  Component Value Date   CHOLHDL 3.9 11/22/2021   Lab Results  Component Value Date   HGBA1C 5.4 02/24/2021      Assessment & Plan:   Problem List Items Addressed This Visit       Cardiovascular and Mediastinum   PSVT (paroxysmal supraventricular tachycardia) (HCC) (Chronic)    On propanolol Has had cardiac monitor evaluation with Cardiology        Nervous and Auditory   Chronic low back pain with bilateral sciatica    Likely due to strenuous activity or heavy lifting Avoid frequent bending and heavy lifting Flexeril as needed for muscle spasms Heating pad as needed Back brace as needed       Relevant Medications   cyclobenzaprine (FLEXERIL) 5 MG tablet   FLUoxetine (PROZAC) 20 MG capsule     Other   Dyspnea on minimal exertion - Primary (Chronic)    Fatigue and dyspnea on exertion - now better compared to prior Had decreased DLCO on PFT with  mild RV dilation Cardiac cath unremarkable Followed by Pulmonology      Mixed hyperlipidemia (Chronic)    Advised to follow low cholesterol diet for now      Anxiety    Well-controlled now Had increased dose of Prozac, 20 mg QD now Hydroxyzine as needed for anxiety      Relevant Medications   FLUoxetine (PROZAC) 20 MG capsule   Psychogenic tremor    Improved now, but has it on intermittent basis On Propranolol       Meds ordered this encounter  Medications   cyclobenzaprine (FLEXERIL) 5 MG tablet    Sig: Take 1 tablet (5 mg total) by mouth at bedtime as needed for muscle spasms.    Dispense:  30 tablet    Refill:  1   FLUoxetine (PROZAC) 20 MG capsule    Sig: Take 1 capsule (20 mg total) by mouth daily.    Dispense:  90 capsule    Refill:  1    Follow-up: Return in about 6 months (around 11/29/2022) for Annual physical.    Lindell Spar, MD

## 2022-05-29 NOTE — Assessment & Plan Note (Signed)
Well-controlled now Had increased dose of Prozac, 20 mg QD now Hydroxyzine as needed for anxiety 

## 2022-05-29 NOTE — Assessment & Plan Note (Signed)
Advised to follow low cholesterol diet for now 

## 2022-05-29 NOTE — Assessment & Plan Note (Signed)
Fatigue and dyspnea on exertion - now better compared to prior Had decreased DLCO on PFT with mild RV dilation Cardiac cath unremarkable Followed by Pulmonology

## 2022-05-29 NOTE — Patient Instructions (Signed)
Please continue to take medications as prescribed.  Please follow low carb diet and perform moderate exercise/walking at least 150 mins/week.

## 2022-05-29 NOTE — Assessment & Plan Note (Signed)
Improved now, but has it on intermittent basis On Propranolol 

## 2022-05-29 NOTE — Assessment & Plan Note (Signed)
On propanolol Has had cardiac monitor evaluation with Cardiology

## 2022-05-29 NOTE — Assessment & Plan Note (Signed)
Likely due to strenuous activity or heavy lifting Avoid frequent bending and heavy lifting Flexeril as needed for muscle spasms Heating pad as needed Back brace as needed

## 2022-06-03 DIAGNOSIS — S233XXA Sprain of ligaments of thoracic spine, initial encounter: Secondary | ICD-10-CM | POA: Diagnosis not present

## 2022-06-03 DIAGNOSIS — S134XXA Sprain of ligaments of cervical spine, initial encounter: Secondary | ICD-10-CM | POA: Diagnosis not present

## 2022-06-03 DIAGNOSIS — S338XXA Sprain of other parts of lumbar spine and pelvis, initial encounter: Secondary | ICD-10-CM | POA: Diagnosis not present

## 2022-06-03 DIAGNOSIS — M4004 Postural kyphosis, thoracic region: Secondary | ICD-10-CM | POA: Diagnosis not present

## 2022-06-03 DIAGNOSIS — M7061 Trochanteric bursitis, right hip: Secondary | ICD-10-CM | POA: Diagnosis not present

## 2022-06-05 ENCOUNTER — Ambulatory Visit: Payer: Self-pay | Admitting: Internal Medicine

## 2022-06-10 ENCOUNTER — Encounter (INDEPENDENT_AMBULATORY_CARE_PROVIDER_SITE_OTHER): Payer: Self-pay | Admitting: Gastroenterology

## 2022-06-10 ENCOUNTER — Ambulatory Visit (INDEPENDENT_AMBULATORY_CARE_PROVIDER_SITE_OTHER): Payer: 59 | Admitting: Gastroenterology

## 2022-06-10 VITALS — BP 139/75 | HR 74 | Temp 98.1°F | Ht 75.0 in | Wt 261.8 lb

## 2022-06-10 DIAGNOSIS — K581 Irritable bowel syndrome with constipation: Secondary | ICD-10-CM

## 2022-06-10 DIAGNOSIS — K219 Gastro-esophageal reflux disease without esophagitis: Secondary | ICD-10-CM | POA: Diagnosis not present

## 2022-06-10 DIAGNOSIS — K2270 Barrett's esophagus without dysplasia: Secondary | ICD-10-CM | POA: Diagnosis not present

## 2022-06-10 DIAGNOSIS — K7581 Nonalcoholic steatohepatitis (NASH): Secondary | ICD-10-CM | POA: Diagnosis not present

## 2022-06-10 NOTE — Progress Notes (Unsigned)
Elijah Shaffer, M.D. Gastroenterology & Hepatology [facility] [address] [place], [place] [address]  Primary Care Physician: Lindell Spar, MD [address] [place] [address]  I will communicate my assessment and recommendations to the referring MD via EMR.  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  male with history of GERD complicated by long segment nondysplastic Barrett's esophagus, history of diverticulitis and anxiety, GERD, large hiatal hernia and recent diagnosis of polyneuropathy of unclear etiology (currently undergoing multidisciplinary evaluation) who presents for evaluation of abdominal bloating.  The patient was last seen on 03/07/2022.  Patient had a KUB which showed presence of large amount of stool for which he was started on Amitiza.  He also had ANA, IgG, ASMA, acute hepatitis panel and iron panel checked for elevated LFTs which were all normal.  It was considered NASH was causing his elevated liver enzymes.  Patient was given dietary recommendations for NASH.  States he has been felling better compared to his previous visit. However, sometimes he is still feeling tired intermittently.  States that he has been following with neurology and infectious disease for possible [facility] spotted fever and neuropathy.  Was recently started on gabapentin.  Patient endorses his bloating has been getting better, but occasionally he feels his abdomen is getting tighter. Has tried IBGard occasionally, sometimes it does help. The patient denies having any nausea, vomiting, fever, chills, hematochezia, melena, hematemesis, heartburn, abdominal pain, diarrhea, jaundice, pruritus or weight loss.  Has 2 Bms per day with Amitiza which he  believes has been helping for his constipation, also takes some oral fiber.  He is taking omeprazole twice a day.  Last EGD: 05/2021 - Esophageal mucosal changes consistent with long-segment Barrett's esophagus, classified as Barrett's stage C5-M6 per Prague criteria (between 30-36 cm). Biopsied - positive for BE but neg for dysplasia. - 9 cm hiatal hernia. - Normal stomach. - Normal examined duodenum.   Recommended to repeat in 3 years.   Last Colonoscopy: 05/2021 -  The perianal and digital rectal examinations were normal. Multiple large-mouthed diverticula were found in the sigmoid colon, transverse colon, ascending colon and cecum. Non-bleeding internal hemorrhoids were found during retroflexion. The  hemorrhoids were small.   Recommended to repeat in 5 years due to family history of cancer.  Past Medical History: Past Medical History:  Diagnosis Date   Acute renal injury ([place]) 06/28/2017   Anxiety    Aspiration pneumonia of right upper lobe due to gastric secretions ([place])    Diverticulitis    GERD (gastroesophageal reflux disease) 01/08/2018   Seasonal allergies    Syncope 06/26/2021    Past Surgical History: Past Surgical History:  Procedure Laterality Date   BIOPSY  03/18/2021   Procedure: BIOPSY;  Surgeon: Harvel Quale, MD;  [facility];  Service: Gastroenterology;;  esophageal at Z-line   BIOPSY  05/23/2021   Procedure: BIOPSY;  Surgeon: Harvel Quale, MD;  [facility];  Service: Gastroenterology;;   CARDIOPULMONARY EXERCISE TEST (CPX)  01/09/2022   Interpretation limited by submaximal effort.  Severe functional impairment when compared to max sedentary norms.  No cardiopulmonary limitations.  Noted tremor and generalized weakness that lead to premature exercise termination-consider neuromuscular evaluation.   COLONOSCOPY N/A 09/22/2017   Procedure: COLONOSCOPY;  Surgeon: Laural Golden,  Mechele Dawley, MD;  [facility];   Service: Endoscopy;  Laterality: N/A;  9:55   COLONOSCOPY WITH PROPOFOL N/A 05/23/2021   Procedure: COLONOSCOPY WITH PROPOFOL;  Surgeon: Harvel Quale, MD;  [facility];  Service: Gastroenterology;  Laterality: N/A;  7:30   ESOPHAGOGASTRODUODENOSCOPY (EGD) WITH PROPOFOL N/A 03/18/2021   Procedure: ESOPHAGOGASTRODUODENOSCOPY (EGD) WITH PROPOFOL;  Surgeon: Harvel Quale, MD;  [facility];  Service: Gastroenterology;  Laterality: N/A;   ESOPHAGOGASTRODUODENOSCOPY (EGD) WITH PROPOFOL N/A 05/23/2021   Procedure: ESOPHAGOGASTRODUODENOSCOPY (EGD) WITH PROPOFOL;  Surgeon: Harvel Quale, MD;  [facility];  Service: Gastroenterology;  Laterality: N/A;   POLYPECTOMY  09/22/2017   Procedure: POLYPECTOMY;  Surgeon: Rogene Houston, MD;  [facility];  Service: Endoscopy;;   RIGHT/LEFT HEART CATH AND CORONARY ANGIOGRAPHY N/A 12/12/2021   Procedure: RIGHT/LEFT HEART CATH AND CORONARY ANGIOGRAPHY;  Surgeon: Jolaine Artist, MD;  Location: [facility];  Service: Cardiovascular:: Normal Coronaries & Hemodynamics.  EF 50-55%. RA = 4 mmHg, RV = 23/4; PA = 22/6 (14) & PCW = 7; Ao sat = 95%; PA sat = 76%, 76%& SVC sat = 74%Fick cardiac output/index = 7.1/3.0; PVR = 1.0 WU   TRANSTHORACIC ECHOCARDIOGRAM  02/24/2021   EF 60 to 65%.  Normal LV function.  Normal valves.  Mild RV dilation with normal function.   ZIO PATCH EVENT MONITOR  01/2022   Sinus rhythm with heart rate range 24 to 159 bpm, 1  AVB and Wenckebach block noted along with rare PACs and PVCs.  No arrhythmias.    Family History: Family History  Problem Relation Age of Onset   Healthy Mother    Cancer Father    Colon cancer Maternal Grandmother    Colon cancer Maternal Grandfather     Social History: Social History   Tobacco Use  Smoking Status Never   Passive exposure: Past  Smokeless Tobacco Never   Social History   Substance and Sexual Activity   Alcohol Use Yes   Comment: occ   Social History   Substance and Sexual Activity  Drug Use No    Allergies: Allergies  Allergen Reactions   Claritin [Loratadine] Other (See Comments)    Heart race   Loratadine-Pseudoephedrine Er Palpitations   Pollen Extract Other (See Comments)    Seasonal allergies    Medications: Current Outpatient Medications  Medication Sig Dispense Refill   aspirin EC 81 MG tablet Take 81 mg by mouth daily. Swallow whole.     b complex vitamins capsule Take 1 capsule by mouth daily. With D3 gummie     cetirizine (ZYRTEC) 10 MG tablet Take 10 mg by mouth daily.     clindamycin (CLEOCIN T) 1 % external solution Apply topically to acne bumps at aa's qd 30 mL 3   clobetasol (TEMOVATE) 0.05 % external solution Apply 1 application. topically 2 (two) times daily. Use as directed.  Mix with cerave cream. Use for up to 4 weeks. Avoid applying to face, groin, and axilla. Use as directed. 50 mL 1   cyclobenzaprine (FLEXERIL) 5 MG tablet Take 1 tablet (5 mg total) by mouth at bedtime as needed for muscle spasms. 30 tablet 1   diphenhydrAMINE-zinc acetate (BENADRYL EXTRA STRENGTH) cream Apply 1 application topically 3 (three) times daily as needed for itching. 28.4 g 0   docusate sodium (COLACE) 100 MG capsule Take 100 mg by mouth 2 (two) times daily.     FLUoxetine (  PROZAC) 20 MG capsule Take 1 capsule (20 mg total) by mouth daily. 90 capsule 1   fluticasone (FLONASE ALLERGY RELIEF) 50 MCG/ACT nasal spray Place 2 sprays into both nostrils daily as needed for allergies or rhinitis. (Patient taking differently: Place 2 sprays into both nostrils daily.) 9.9 mL 2   guaiFENesin (MUCINEX) 600 MG 12 hr tablet Take 600 mg by mouth 2 (two) times daily as needed for to loosen phlegm.     hydrOXYzine (VISTARIL) 25 MG capsule Take 1 capsule (25 mg total) by mouth every 8 (eight) hours as needed. Take before bed as needed for itchy. Can cause drowsiness. 60 capsule 2   ketoconazole  (NIZORAL) 2 % shampoo Apply 1 application. topically daily as needed for irritation. 120 mL 11   lubiprostone (AMITIZA) 8 MCG capsule Take 1 capsule (8 mcg total) by mouth 2 (two) times daily with a meal. 180 capsule 1   meclizine (ANTIVERT) 12.5 MG tablet Take 12.5 mg by mouth daily as needed for dizziness.     montelukast (SINGULAIR) 10 MG tablet Take 1 tablet (10 mg total) by mouth at bedtime. 30 tablet 11   omega-3 acid ethyl esters (LOVAZA) 1 g capsule Take 2 capsules (2 g total) by mouth 2 (two) times daily. 120 capsule 3   omeprazole (PRILOSEC) 40 MG capsule Take 1 capsule (40 mg total) by mouth 2 (two) times daily. 180 capsule 3   ondansetron (ZOFRAN) 4 MG tablet Take 1 tablet (4 mg total) by mouth every 6 (six) hours as needed for nausea. 20 tablet 0   Potassium 99 MG TABS Take 1 tablet by mouth daily.     propranolol (INDERAL) 20 MG tablet Take one tablet 20 mg  twice daily  may take an  additional dose of 20 mg  as needed for palpitations 190 tablet 3   triamcinolone cream (KENALOG) 0.1 % Apply 1 application. topically 2 (two) times daily. 30 g 0   No current facility-administered medications for this visit.    Review of Systems: GENERAL: negative for malaise, night sweats HEENT: No changes in hearing or vision, no nose bleeds or other nasal problems. NECK: Negative for lumps, goiter, pain and significant neck swelling RESPIRATORY: Negative for cough, wheezing CARDIOVASCULAR: Negative for chest pain, leg swelling, palpitations, orthopnea GI: SEE HPI MUSCULOSKELETAL: Negative for joint pain or swelling, back pain, and muscle pain. SKIN: Negative for lesions, rash PSYCH: Negative for sleep disturbance, mood disorder and recent psychosocial stressors. HEMATOLOGY Negative for prolonged bleeding, bruising easily, and swollen nodes. ENDOCRINE: Negative for cold or heat intolerance, polyuria, polydipsia and goiter. NEURO: negative for tremor, gait imbalance, syncope and seizures. The  remainder of the review of systems is noncontributory.   Physical Exam: BP 139/75 (BP Location: Left Arm, Patient Position: Sitting, Cuff Size: Large)   Pulse 74   Temp 98.1 F (36.7 C) (Oral)   Ht '6\' 3"'$  (1.905 m)   Wt 261 lb 12.8 oz (118.8 kg)   BMI 32.72 kg/m  GENERAL: The patient is AO x3, in no acute distress. Obese. HEENT: Head is normocephalic and atraumatic. EOMI are intact. Mouth is well hydrated and without lesions. NECK: Supple. No masses LUNGS: Clear to auscultation. No presence of rhonchi/wheezing/rales. Adequate chest expansion HEART: RRR, normal s1 and s2. ABDOMEN: Soft, nontender, no guarding, no peritoneal signs, and nondistended. BS +. No masses. EXTREMITIES: Without any cyanosis, clubbing, rash, lesions or edema. NEUROLOGIC: AOx3, no focal motor deficit. SKIN: no jaundice, no rashes  Imaging/Labs: as above  I personally reviewed and interpreted the available labs, imaging and endoscopic files.  Impression and Plan: KENNETH CUARESMA is a 41 y.o.  male with history of GERD complicated by long segment nondysplastic Barrett's esophagus, history of diverticulitis and anxiety, GERD, large hiatal hernia and recent diagnosis of polyneuropathy of unclear etiology (currently undergoing multidisciplinary evaluation) who presents for evaluation of abdominal bloating.  The patient has had multiple investigations that have been negative for a clear cause of his bloating.  I do believe that since he has presented some improvement with the use of IBgard and the endoscopic procedures have been negative for definite diagnosis, this is likely related to IBS.  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  He can continue with IBgard as needed to improve his symptoms.  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  His GERD has been well controlled and he can try decreasing his omeprazole to once  a day.  He will need a repeat EGD 3 years out from his most recent surveillance endoscopy.  Finally, his NASH may benefit for reporting implementing a Mediterranean diet compliantly. He should also work on losing weight to achieve further regression of his steatohepatitis.  - Implement Mediterranean diet - Explained to the patient the etiology and consequences of his current liver disease. Patient was counseled about the benefit of implementing a Mediterranean diet and exercise plan to decrease at least 5% of weight. The patient understood about the importance of lifestyle changes to potentially reverse his liver involvement. - Continue IBGard 1 tablet every 8-12 hours as needed for bloating - Decrease omeprazole to 40 mg qday - Continue Amitiza 8 mcg twice a day  All questions were answered.      Harvel Quale, MD Gastroenterology and Hepatology [facility]

## 2022-06-10 NOTE — Patient Instructions (Addendum)
Implement Mediterranean diet Explained to the patient the etiology and consequences of his current liver disease. Patient was counseled about the benefit of implementing a Mediterranean diet and exercise plan to decrease at least 5% of weight. The patient understood about the importance of lifestyle changes to potentially reverse his liver involvement. Continue IBGard 1 tablet every 8-12 hours as needed for bloating Decrease omeprazole to 40 mg qday Continue Amitiza 8 mcg twice a day

## 2022-06-11 ENCOUNTER — Telehealth: Payer: 59

## 2022-06-11 ENCOUNTER — Ambulatory Visit: Payer: 59 | Admitting: Dermatology

## 2022-06-11 ENCOUNTER — Encounter: Payer: Self-pay | Admitting: Dermatology

## 2022-06-11 DIAGNOSIS — L2089 Other atopic dermatitis: Secondary | ICD-10-CM | POA: Diagnosis not present

## 2022-06-11 DIAGNOSIS — L219 Seborrheic dermatitis, unspecified: Secondary | ICD-10-CM | POA: Diagnosis not present

## 2022-06-11 DIAGNOSIS — L820 Inflamed seborrheic keratosis: Secondary | ICD-10-CM

## 2022-06-11 DIAGNOSIS — L814 Other melanin hyperpigmentation: Secondary | ICD-10-CM | POA: Diagnosis not present

## 2022-06-11 DIAGNOSIS — L281 Prurigo nodularis: Secondary | ICD-10-CM | POA: Diagnosis not present

## 2022-06-11 MED ORDER — KETOCONAZOLE 2 % EX SHAM: 1.0000 | MEDICATED_SHAMPOO | Freq: Every day | CUTANEOUS | 11 refills | Status: DC | PRN

## 2022-06-11 MED ORDER — FLUOCINONIDE 0.05 % EX SOLN: CUTANEOUS | 2 refills | Status: DC

## 2022-06-11 NOTE — Patient Instructions (Addendum)
Scalp:  Continue Ketoconazole 2% shampoo on scalp as directed.   Lidex scalp solution BID to affected area on scalp up to 2 weeks as needed. Avoid applying to face, groin, and axilla. Use as directed. Long-term use can cause thinning of the skin.  Legs: Continue CeraVe/Clobetasol mixture twice daily up to 2 weeks as needed for rash/itching.  Continue Hydroxyzine 25 mg 1 tablet every 8 hours as needed for itching.    Topical steroids (such as triamcinolone, fluocinolone, fluocinonide, mometasone, clobetasol, halobetasol, betamethasone, hydrocortisone) can cause thinning and lightening of the skin if they are used for too long in the same area. Your physician has selected the right strength medicine for your problem and area affected on the body. Please use your medication only as directed by your physician to prevent side effects.     Cryotherapy Aftercare  Wash gently with soap and water everyday.   Apply Vaseline  daily until healed.   Gentle Skin Care Guide  1. Bathe no more than once a day.  2. Avoid bathing in hot water  3. Use a mild soap like Dove, Vanicream, Cetaphil, CeraVe. Can use Lever 2000 or Cetaphil antibacterial soap  4. Use soap only where you need it. On most days, use it under your arms, between your legs, and on your feet. Let the water rinse other areas unless visibly dirty.  5. When you get out of the bath/shower, use a towel to gently blot your skin dry, don't rub it.  6. While your skin is still a little damp, apply a moisturizing cream such as Vanicream, CeraVe, Cetaphil, Eucerin, Sarna lotion or plain Vaseline Jelly. For hands apply Neutrogena [place] Hand Cream or Excipial Hand Cream.  7. Reapply moisturizer any time you start to itch or feel dry.  8. Sometimes using free and clear laundry detergents can be helpful. Fabric softener sheets should be avoided. Downy Free & Gentle liquid, or any liquid fabric softener that is free of dyes and perfumes, it  acceptable to use  9. If your doctor has given you prescription creams you may apply moisturizers over them    Due to recent changes in healthcare laws, you may see results of your pathology and/or laboratory studies on MyChart before the doctors have had a chance to review them. We understand that in some cases there may be results that are confusing or concerning to you. Please understand that not all results are received at the same time and often the doctors may need to interpret multiple results in order to provide you with the best plan of care or course of treatment. Therefore, we ask that you please give [place] 2 business days to thoroughly review all your results before contacting the office for clarification. Should we see a critical lab result, you will be contacted sooner.   If You Need Anything After Your Visit  If you have any questions or concerns for your doctor, please call our main line at [phone] and press option 4 to reach your doctor's medical assistant. If no one answers, please leave a voicemail as directed and we will return your call as soon as possible. Messages left after 4 pm will be answered the following business day.   You may also send [place] a message via [place]. We typically respond to MyChart messages within 1-2 business days.  For prescription refills, please ask your pharmacy to contact our office. Our fax number is [phone].  If you have an urgent issue when the clinic  is closed that cannot wait until the next business day, you can page your doctor at the number below.    Please note that while we do our best to be available for urgent issues outside of office hours, we are not available 24/7.   If you have an urgent issue and are unable to reach [place], you may choose to seek medical care at your doctor's office, retail clinic, urgent care center, or emergency room.  If you have a medical emergency, please immediately call 911 or go to the emergency  department.  Pager Numbers  - Dr. Nehemiah Massed: [phone]  - Dr. Laurence Ferrari: [phone]  - Dr. Nicole Kindred: [phone]  In the event of inclement weather, please call our main line at [phone] for an update on the status of any delays or closures.  Dermatology Medication Tips: Please keep the boxes that topical medications come in in order to help keep track of the instructions about where and how to use these. Pharmacies typically print the medication instructions only on the boxes and not directly on the medication tubes.   If your medication is too expensive, please contact our office at [phone] option 4 or send [place] a message through [place].   We are unable to tell what your co-pay for medications will be in advance as this is different depending on your insurance coverage. However, we may be able to find a substitute medication at lower cost or fill out paperwork to get insurance to cover a needed medication.   If a prior authorization is required to get your medication covered by your insurance company, please allow [place] 1-2 business days to complete this process.  Drug prices often vary depending on where the prescription is filled and some pharmacies may offer cheaper prices.  The website www.goodrx.com contains coupons for medications through different pharmacies. The prices here do not account for what the cost may be with help from insurance (it may be cheaper with your insurance), but the website can give you the price if you did not use any insurance.  - You can print the associated coupon and take it with your prescription to the pharmacy.  - You may also stop by our office during regular business hours and pick up a GoodRx coupon card.  - If you need your prescription sent electronically to a different pharmacy, notify our office through [facility] or by phone at [phone] option 4.     Si Usted Necesita Algo Despus de Su Visita  Tambin puede enviarnos un  mensaje a travs de Pharmacist, community. Por lo general respondemos a los mensajes de MyChart en el transcurso de 1 a 2 das hbiles.  Para renovar recetas, por favor pida a su farmacia que se ponga en contacto con nuestra oficina. Harland Dingwall de fax es [place] [phone].  Si tiene un asunto urgente cuando la clnica est cerrada y que no puede esperar hasta el siguiente da hbil, puede llamar/localizar a su doctor(a) al nmero que aparece a continuacin.   Por favor, tenga en cuenta que aunque hacemos todo lo posible para estar disponibles para asuntos urgentes fuera del horario de [place], no estamos disponibles las 24 horas del da, los 7 das de la [place].   Si tiene un problema urgente y no puede comunicarse con nosotros, puede optar por buscar atencin mdica  en el consultorio de su doctor(a), en una clnica privada, en un centro de atencin urgente o en una sala de emergencias.  Si tiene Engineer, maintenance (IT)  mdica, por favor llame inmediatamente al 911 o vaya a la sala de emergencias.  Nmeros de bper  - Dr. Nehemiah Massed: [phone]  - Dra. Moye: [phone]  - Dra. Nicole Kindred: [phone]  En caso de inclemencias del [place], por favor llame a Johnsie Kindred principal al [phone] para una actualizacin sobre el [place] de cualquier retraso o cierre.  Consejos para la medicacin en dermatologa: Por favor, guarde las cajas en las que vienen los medicamentos de uso tpico para ayudarle a seguir las instrucciones sobre dnde y cmo usarlos. Las farmacias generalmente imprimen las instrucciones del medicamento slo en las cajas y no directamente en los tubos del [place].   Si su medicamento es muy caro, por favor, pngase en contacto con Zigmund Daniel llamando al [phone] y presione la opcin 4 o envenos un mensaje a travs de Pharmacist, community.   No podemos decirle cul ser su copago por los medicamentos por adelantado ya que esto es diferente dependiendo de la cobertura de su seguro. Sin embargo,  es posible que podamos encontrar un medicamento sustituto a Electrical engineer un formulario para que el seguro cubra el medicamento que se considera necesario.   Si se requiere una autorizacin previa para que su compaa de seguros [place] su medicamento, por favor permtanos de 1 a 2 das hbiles para completar este proceso.  Los precios de los medicamentos varan con frecuencia dependiendo del Environmental consultant de dnde se surte la receta y alguna farmacias pueden ofrecer precios ms baratos.  El sitio web www.goodrx.com tiene cupones para medicamentos de Airline pilot. Los precios aqu no tienen en cuenta lo que podra costar con la ayuda del seguro (puede ser ms barato con su seguro), pero el sitio web puede darle el precio si no utiliz Research scientist (physical sciences).  - Puede imprimir el cupn correspondiente y llevarlo con su receta a la farmacia.  - Tambin puede pasar por nuestra oficina durante el horario de atencin regular y Charity fundraiser una tarjeta de cupones de GoodRx.  - Si necesita que su receta se enve electrnicamente a una farmacia diferente, informe a nuestra oficina a travs de MyChart de [place] o por telfono llamando al [phone] y presione la opcin 4.

## 2022-06-11 NOTE — Progress Notes (Signed)
Follow-Up Visit   Subjective  Elijah Shaffer is a 41 y.o. male who presents for the following: Follow-up (6 week recheck of seborrheic dermatitis on scalp, eczema on left leg/chest/back, and prurigo at left lower leg. Using Ketoconazole shampoo on scalp and CeraVe/Clobetasol mixture for eczema. Hx of ILK injections for prurigo ). Has improved.    The following portions of the chart were reviewed this encounter and updated as appropriate:      Review of Systems: No other skin or systemic complaints except as noted in HPI or Assessment and Plan.   Objective  Well appearing patient in no apparent distress; mood and affect are within normal limits.  A focused examination was performed including scalp, left leg. Relevant physical exam findings are noted in the Assessment and Plan.  Scalp Mild pinkness/scale  right lower leg Small pink scaly patch at right lateral ankle. Xerosis at B/L elbows, R>L, trunk clear  Left Lower Leg - Anterior Light pink scaly patch at left lateral leg  posterior crown of scalp Pink keratotic papule   Assessment & Plan  Seborrheic dermatitis Scalp  Chronic condition with duration or expected duration over one year. Currently well-controlled.   Continue Ketoconazole 2% shampoo on scalp as directed.   Related Medications ketoconazole (NIZORAL) 2 % shampoo Apply 1 Application topically daily as needed for irritation.  Other atopic dermatitis right lower leg  Chronic and persistent condition with duration or expected duration over one year. Condition is symptomatic/ bothersome to patient. Improving but not currently at goal.   Atopic dermatitis (eczema) is a chronic, relapsing, pruritic condition that can significantly affect quality of life. It is often associated with allergic rhinitis and/or asthma and can require treatment with topical medications, phototherapy, or in severe cases biologic injectable medication (Dupixent; Adbry) or Oral JAK  inhibitors.  Continue CeraVe/Clobetasol mixture twice daily up to 2 weeks as needed for rash/itching.  Continue Hydroxyzine 25 mg 1 tablet every 8 hours as needed for itching.   Recommend mild soap and moisturizing cream 1-2 times daily.  Gentle skin care handout provided.    Related Medications clobetasol (TEMOVATE) 0.05 % external solution Apply 1 application. topically 2 (two) times daily. Use as directed.  Mix with cerave cream. Use for up to 4 weeks. Avoid applying to face, groin, and axilla. Use as directed.  hydrOXYzine (VISTARIL) 25 MG capsule Take 1 capsule (25 mg total) by mouth every 8 (eight) hours as needed. Take before bed as needed for itchy. Can cause drowsiness.  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  Use CeraVe/Clobetasol mixture qd/bid as needed for itching.   Inflamed seborrheic keratosis posterior crown of scalp  Vs prurigo nodule Symptomatic, irritating, patient would like treated.  Once healed, Lidex scalp solution BID to affected area on scalp up to 2 weeks as needed for itch.  Avoid applying to face, groin, and axilla. Use as directed. Long-term use can cause thinning of the skin.  Topical steroids (such as triamcinolone, fluocinolone, fluocinonide, mometasone, clobetasol, halobetasol, betamethasone, hydrocortisone) can cause thinning and lightening of the skin if they are used for too long in the same area. Your physician has selected the right strength medicine for your problem and area affected on the body. Please use your medication only as directed by your physician to prevent side effects.    Destruction of lesion - posterior crown of scalp  Destruction method: cryotherapy   Informed consent: discussed  and consent obtained   Lesion destroyed using liquid nitrogen: Yes   Region frozen until ice ball extended beyond lesion: Yes   Outcome: patient tolerated procedure well with  no complications   Post-procedure details: wound care instructions given   Additional details:  Prior to procedure, discussed risks of blister formation, small wound, skin dyspigmentation, or rare scar following cryotherapy. Recommend Vaseline ointment to treated areas while healing.   fluocinonide (LIDEX) 0.05 % external solution - posterior crown of scalp Apply once or twice daily to affected areas on scalp up to 2 weeks as needed for itching. Avoid applying to face, groin, and axilla.   Lentigines. Ears. - Scattered tan macules - Due to sun exposure - Benign-appering, observe - Recommend daily broad spectrum sunscreen SPF 30+ to sun-exposed areas, reapply every 2 hours as needed. - Call for any changes   Return in about 3 months (around 09/11/2022) for Seborrheic Dermatitis, prurigo, atopic dermatitis, UBSE  Follow Up.  I, Elijah Shaffer, CMA, am acting as scribe for Elijah Patty, MD.  Documentation: I have reviewed the above documentation for accuracy and completeness, and I agree with the above.  Elijah Patty MD

## 2022-06-17 ENCOUNTER — Other Ambulatory Visit: Payer: Self-pay

## 2022-06-17 DIAGNOSIS — R030 Elevated blood-pressure reading, without diagnosis of hypertension: Secondary | ICD-10-CM | POA: Diagnosis not present

## 2022-06-17 DIAGNOSIS — M4004 Postural kyphosis, thoracic region: Secondary | ICD-10-CM | POA: Diagnosis not present

## 2022-06-17 DIAGNOSIS — S233XXA Sprain of ligaments of thoracic spine, initial encounter: Secondary | ICD-10-CM | POA: Diagnosis not present

## 2022-06-17 DIAGNOSIS — R42 Dizziness and giddiness: Secondary | ICD-10-CM | POA: Diagnosis not present

## 2022-06-17 DIAGNOSIS — L739 Follicular disorder, unspecified: Secondary | ICD-10-CM

## 2022-06-17 DIAGNOSIS — S338XXA Sprain of other parts of lumbar spine and pelvis, initial encounter: Secondary | ICD-10-CM | POA: Diagnosis not present

## 2022-06-17 DIAGNOSIS — R251 Tremor, unspecified: Secondary | ICD-10-CM | POA: Diagnosis not present

## 2022-06-17 DIAGNOSIS — M7061 Trochanteric bursitis, right hip: Secondary | ICD-10-CM | POA: Diagnosis not present

## 2022-06-17 DIAGNOSIS — S134XXA Sprain of ligaments of cervical spine, initial encounter: Secondary | ICD-10-CM | POA: Diagnosis not present

## 2022-06-17 DIAGNOSIS — R2689 Other abnormalities of gait and mobility: Secondary | ICD-10-CM | POA: Diagnosis not present

## 2022-06-17 MED ORDER — CLINDAMYCIN PHOSPHATE 1 % EX SOLN: CUTANEOUS | 3 refills | Status: DC

## 2022-06-17 NOTE — Progress Notes (Signed)
Refill request faxed from [facility]. Escripted

## 2022-06-24 DIAGNOSIS — M4004 Postural kyphosis, thoracic region: Secondary | ICD-10-CM | POA: Diagnosis not present

## 2022-06-24 DIAGNOSIS — S233XXA Sprain of ligaments of thoracic spine, initial encounter: Secondary | ICD-10-CM | POA: Diagnosis not present

## 2022-06-24 DIAGNOSIS — S134XXA Sprain of ligaments of cervical spine, initial encounter: Secondary | ICD-10-CM | POA: Diagnosis not present

## 2022-06-24 DIAGNOSIS — M7061 Trochanteric bursitis, right hip: Secondary | ICD-10-CM | POA: Diagnosis not present

## 2022-06-24 DIAGNOSIS — S338XXA Sprain of other parts of lumbar spine and pelvis, initial encounter: Secondary | ICD-10-CM | POA: Diagnosis not present

## 2022-07-01 DIAGNOSIS — S134XXA Sprain of ligaments of cervical spine, initial encounter: Secondary | ICD-10-CM | POA: Diagnosis not present

## 2022-07-01 DIAGNOSIS — S338XXA Sprain of other parts of lumbar spine and pelvis, initial encounter: Secondary | ICD-10-CM | POA: Diagnosis not present

## 2022-07-01 DIAGNOSIS — M7061 Trochanteric bursitis, right hip: Secondary | ICD-10-CM | POA: Diagnosis not present

## 2022-07-01 DIAGNOSIS — M4004 Postural kyphosis, thoracic region: Secondary | ICD-10-CM | POA: Diagnosis not present

## 2022-07-01 DIAGNOSIS — S233XXA Sprain of ligaments of thoracic spine, initial encounter: Secondary | ICD-10-CM | POA: Diagnosis not present

## 2022-07-08 DIAGNOSIS — S134XXA Sprain of ligaments of cervical spine, initial encounter: Secondary | ICD-10-CM | POA: Diagnosis not present

## 2022-07-08 DIAGNOSIS — M7061 Trochanteric bursitis, right hip: Secondary | ICD-10-CM | POA: Diagnosis not present

## 2022-07-08 DIAGNOSIS — S338XXA Sprain of other parts of lumbar spine and pelvis, initial encounter: Secondary | ICD-10-CM | POA: Diagnosis not present

## 2022-07-08 DIAGNOSIS — M4004 Postural kyphosis, thoracic region: Secondary | ICD-10-CM | POA: Diagnosis not present

## 2022-07-08 DIAGNOSIS — S233XXA Sprain of ligaments of thoracic spine, initial encounter: Secondary | ICD-10-CM | POA: Diagnosis not present

## 2022-07-18 DIAGNOSIS — S338XXA Sprain of other parts of lumbar spine and pelvis, initial encounter: Secondary | ICD-10-CM | POA: Diagnosis not present

## 2022-07-18 DIAGNOSIS — S233XXA Sprain of ligaments of thoracic spine, initial encounter: Secondary | ICD-10-CM | POA: Diagnosis not present

## 2022-07-18 DIAGNOSIS — S134XXA Sprain of ligaments of cervical spine, initial encounter: Secondary | ICD-10-CM | POA: Diagnosis not present

## 2022-07-18 DIAGNOSIS — M4004 Postural kyphosis, thoracic region: Secondary | ICD-10-CM | POA: Diagnosis not present

## 2022-07-18 DIAGNOSIS — M7061 Trochanteric bursitis, right hip: Secondary | ICD-10-CM | POA: Diagnosis not present

## 2022-07-22 DIAGNOSIS — S233XXA Sprain of ligaments of thoracic spine, initial encounter: Secondary | ICD-10-CM | POA: Diagnosis not present

## 2022-07-22 DIAGNOSIS — S338XXA Sprain of other parts of lumbar spine and pelvis, initial encounter: Secondary | ICD-10-CM | POA: Diagnosis not present

## 2022-07-22 DIAGNOSIS — M4004 Postural kyphosis, thoracic region: Secondary | ICD-10-CM | POA: Diagnosis not present

## 2022-07-22 DIAGNOSIS — M7061 Trochanteric bursitis, right hip: Secondary | ICD-10-CM | POA: Diagnosis not present

## 2022-07-22 DIAGNOSIS — S134XXA Sprain of ligaments of cervical spine, initial encounter: Secondary | ICD-10-CM | POA: Diagnosis not present

## 2022-07-24 DIAGNOSIS — S233XXA Sprain of ligaments of thoracic spine, initial encounter: Secondary | ICD-10-CM | POA: Diagnosis not present

## 2022-07-24 DIAGNOSIS — M4004 Postural kyphosis, thoracic region: Secondary | ICD-10-CM | POA: Diagnosis not present

## 2022-07-24 DIAGNOSIS — S338XXA Sprain of other parts of lumbar spine and pelvis, initial encounter: Secondary | ICD-10-CM | POA: Diagnosis not present

## 2022-07-24 DIAGNOSIS — M7061 Trochanteric bursitis, right hip: Secondary | ICD-10-CM | POA: Diagnosis not present

## 2022-07-24 DIAGNOSIS — S134XXA Sprain of ligaments of cervical spine, initial encounter: Secondary | ICD-10-CM | POA: Diagnosis not present

## 2022-07-25 ENCOUNTER — Encounter: Payer: Self-pay | Admitting: Internal Medicine

## 2022-07-25 DIAGNOSIS — S134XXA Sprain of ligaments of cervical spine, initial encounter: Secondary | ICD-10-CM | POA: Diagnosis not present

## 2022-07-25 DIAGNOSIS — S233XXA Sprain of ligaments of thoracic spine, initial encounter: Secondary | ICD-10-CM | POA: Diagnosis not present

## 2022-07-25 DIAGNOSIS — M4004 Postural kyphosis, thoracic region: Secondary | ICD-10-CM | POA: Diagnosis not present

## 2022-07-25 DIAGNOSIS — S338XXA Sprain of other parts of lumbar spine and pelvis, initial encounter: Secondary | ICD-10-CM | POA: Diagnosis not present

## 2022-07-25 DIAGNOSIS — M7061 Trochanteric bursitis, right hip: Secondary | ICD-10-CM | POA: Diagnosis not present

## 2022-07-29 ENCOUNTER — Encounter: Payer: Self-pay | Admitting: Internal Medicine

## 2022-07-29 ENCOUNTER — Ambulatory Visit (INDEPENDENT_AMBULATORY_CARE_PROVIDER_SITE_OTHER): Payer: 59 | Admitting: Internal Medicine

## 2022-07-29 VITALS — BP 136/84 | HR 84 | Ht 75.0 in | Wt 263.0 lb

## 2022-07-29 DIAGNOSIS — S338XXA Sprain of other parts of lumbar spine and pelvis, initial encounter: Secondary | ICD-10-CM | POA: Diagnosis not present

## 2022-07-29 DIAGNOSIS — Z23 Encounter for immunization: Secondary | ICD-10-CM

## 2022-07-29 DIAGNOSIS — M5136 Other intervertebral disc degeneration, lumbar region: Secondary | ICD-10-CM | POA: Diagnosis not present

## 2022-07-29 DIAGNOSIS — M4802 Spinal stenosis, cervical region: Secondary | ICD-10-CM | POA: Diagnosis not present

## 2022-07-29 DIAGNOSIS — S233XXA Sprain of ligaments of thoracic spine, initial encounter: Secondary | ICD-10-CM | POA: Diagnosis not present

## 2022-07-29 DIAGNOSIS — M4004 Postural kyphosis, thoracic region: Secondary | ICD-10-CM | POA: Diagnosis not present

## 2022-07-29 DIAGNOSIS — S134XXA Sprain of ligaments of cervical spine, initial encounter: Secondary | ICD-10-CM | POA: Diagnosis not present

## 2022-07-29 DIAGNOSIS — M7061 Trochanteric bursitis, right hip: Secondary | ICD-10-CM | POA: Diagnosis not present

## 2022-07-29 MED ORDER — NAPROXEN 500 MG PO TABS: 500.0000 mg | ORAL_TABLET | Freq: Two times a day (BID) | ORAL | 1 refills | Status: DC

## 2022-07-29 NOTE — Assessment & Plan Note (Signed)
Has had lumbar spine x-ray, which showed DDD and bone spurring - note received from chiropractor Has had spinal manipulation done by chiropractor Advised to avoid heavy lifting and frequent bending Flexeril as needed for muscle spasms Naproxen as needed for back pain Continue gabapentin for neuropathic pain Referred to spine surgery

## 2022-07-29 NOTE — Patient Instructions (Addendum)
Please continue taking medications as prescribed.  Please take Naproxen as needed for back pain.  Avoid heavy lifting and frequent bending.  You are being referred to Spine surgery.  [address] [place], [place] [address] [phone]

## 2022-07-29 NOTE — Assessment & Plan Note (Signed)
Has had an MRI of cervical spine for investigation of hand tremors Showed cervical spinal stenosis Has chronic neck pain Naproxen as needed Referred to spine surgery

## 2022-07-29 NOTE — Progress Notes (Signed)
Acute Office Visit  Subjective:    Patient ID: Elijah Shaffer, male    DOB: 1981/10/15, 41 y.o.   MRN: [id number]  Chief Complaint  Patient presents with   Back Pain    Back pain for about 4 months has had mri seen chiropractor.    HPI Patient is in today for complaint of chronic mid and lower back pain, constant, sharp, radiating to bilateral LE, worse with movement and better with rest.  He also has chronic numbness and weakness of the LE.  He has had neurology evaluation for neuropathic symptoms and was placed on gabapentin for it.  He has not noticed much improvement in his pain.  He has seen chiropractor for chronic back pain, who performed x-rays of spine, which showed DDD and bone spurring.  He has had spinal manipulation done by chiropractor as well.  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  He has tried taking Flexeril with some relief.  Denies any saddle anesthesia, urinary or stool incontinence.  Past Medical History:  Diagnosis Date   Acute renal injury ([place]) 06/28/2017   Anxiety    Aspiration pneumonia of right upper lobe due to gastric secretions ([place])    Diverticulitis    GERD (gastroesophageal reflux disease) 01/08/2018   Seasonal allergies    Syncope 06/26/2021    Past Surgical History:  Procedure Laterality Date   BIOPSY  03/18/2021   Procedure: BIOPSY;  Surgeon: Harvel Quale, MD;  [facility];  Service: Gastroenterology;;  esophageal at Z-line   BIOPSY  05/23/2021   Procedure: BIOPSY;  Surgeon: Harvel Quale, MD;  [facility];  Service: Gastroenterology;;   CARDIOPULMONARY EXERCISE TEST (CPX)  01/09/2022   Interpretation limited by submaximal effort.  Severe functional impairment when compared to max sedentary norms.  No cardiopulmonary limitations.  Noted tremor and generalized weakness that lead to premature exercise termination-consider  neuromuscular evaluation.   COLONOSCOPY N/A 09/22/2017   Procedure: COLONOSCOPY;  Surgeon: Rogene Houston, MD;  [facility];  Service: Endoscopy;  Laterality: N/A;  9:55   COLONOSCOPY WITH PROPOFOL N/A 05/23/2021   Procedure: COLONOSCOPY WITH PROPOFOL;  Surgeon: Harvel Quale, MD;  [facility];  Service: Gastroenterology;  Laterality: N/A;  7:30   ESOPHAGOGASTRODUODENOSCOPY (EGD) WITH PROPOFOL N/A 03/18/2021   Procedure: ESOPHAGOGASTRODUODENOSCOPY (EGD) WITH PROPOFOL;  Surgeon: Harvel Quale, MD;  [facility];  Service: Gastroenterology;  Laterality: N/A;   ESOPHAGOGASTRODUODENOSCOPY (EGD) WITH PROPOFOL N/A 05/23/2021   Procedure: ESOPHAGOGASTRODUODENOSCOPY (EGD) WITH PROPOFOL;  Surgeon: Harvel Quale, MD;  [facility];  Service: Gastroenterology;  Laterality: N/A;   POLYPECTOMY  09/22/2017   Procedure: POLYPECTOMY;  Surgeon: Rogene Houston, MD;  [facility];  Service: Endoscopy;;   RIGHT/LEFT HEART CATH AND CORONARY ANGIOGRAPHY N/A 12/12/2021   Procedure: RIGHT/LEFT HEART CATH AND CORONARY ANGIOGRAPHY;  Surgeon: Jolaine Artist, MD;  Location: [facility];  Service: Cardiovascular:: Normal Coronaries & Hemodynamics.  EF 50-55%. RA = 4 mmHg, RV = 23/4; PA = 22/6 (14) & PCW = 7; Ao sat = 95%; PA sat = 76%, 76%& SVC sat = 74%Fick cardiac output/index = 7.1/3.0; PVR = 1.0 WU   TRANSTHORACIC ECHOCARDIOGRAM  02/24/2021   EF 60 to 65%.  Normal LV function.  Normal valves.  Mild RV dilation with normal function.   ZIO PATCH EVENT MONITOR  01/2022   Sinus rhythm with heart rate range 24 to 159 bpm, 1  AVB and Wenckebach block noted along with rare PACs and PVCs.  No arrhythmias.    Family History  Problem Relation Age of Onset   Healthy Mother    Cancer Father    Colon cancer Maternal Grandmother    Colon cancer Maternal Grandfather     Social History   Socioeconomic History   Marital  status: Single    Spouse name: Not on file   Number of children: Not on file   Years of education: Not on file   Highest education level: Not on file  Occupational History   Not on file  Tobacco Use   Smoking status: Never    Passive exposure: Past   Smokeless tobacco: Never  Vaping Use   Vaping Use: Never used  Substance and Sexual Activity   Alcohol use: Yes    Comment: occ   Drug use: No   Sexual activity: Not on file  Other Topics Concern   Not on file  Social History Narrative   Not on file   Social Determinants of Health   Financial Resource Strain: Not on file  Food Insecurity: Not on file  Transportation Needs: Not on file  Physical Activity: Insufficiently Active (03/29/2022)   Exercise Vital Sign    Days of Exercise per Week: 2 days    Minutes of Exercise per Session: 10 min  Stress: Stress Concern Present (05/07/2022)   [place]    Feeling of Stress : To some extent  Social Connections: Not on file  Intimate Partner Violence: Not on file    Outpatient Medications Prior to Visit  Medication Sig Dispense Refill   aspirin EC 81 MG tablet Take 81 mg by mouth daily. Swallow whole.     b complex vitamins capsule Take 1 capsule by mouth daily. With D3 gummie     cetirizine (ZYRTEC) 10 MG tablet Take 10 mg by mouth daily.     clindamycin (CLEOCIN T) 1 % external solution Apply topically to acne bumps at aa's qd 30 mL 3   clobetasol (TEMOVATE) 0.05 % external solution Apply 1 application. topically 2 (two) times daily. Use as directed.  Mix with cerave cream. Use for up to 4 weeks. Avoid applying to face, groin, and axilla. Use as directed. 50 mL 1   cyclobenzaprine (FLEXERIL) 5 MG tablet Take 1 tablet (5 mg total) by mouth at bedtime as needed for muscle spasms. 30 tablet 1   diphenhydrAMINE-zinc acetate (BENADRYL EXTRA STRENGTH) cream Apply 1 application topically 3 (three) times daily as needed for  itching. 28.4 g 0   docusate sodium (COLACE) 100 MG capsule Take 100 mg by mouth 2 (two) times daily.     fluocinonide (LIDEX) 0.05 % external solution Apply once or twice daily to affected areas on scalp up to 2 weeks as needed for itching. Avoid applying to face, groin, and axilla. 60 mL 2   FLUoxetine (PROZAC) 20 MG capsule Take 1 capsule (20 mg total) by mouth daily. 90 capsule 1   fluticasone (FLONASE ALLERGY RELIEF) 50 MCG/ACT nasal spray Place 2 sprays into both nostrils daily as needed for allergies or rhinitis. (Patient taking differently: Place 2 sprays into both nostrils daily.) 9.9 mL 2   guaiFENesin (MUCINEX) 600 MG 12 hr tablet Take 600 mg by mouth 2 (two) times daily as needed for to loosen phlegm.  hydrOXYzine (VISTARIL) 25 MG capsule Take 1 capsule (25 mg total) by mouth every 8 (eight) hours as needed. Take before bed as needed for itchy. Can cause drowsiness. 60 capsule 2   ketoconazole (NIZORAL) 2 % shampoo Apply 1 Application topically daily as needed for irritation. 120 mL 11   lubiprostone (AMITIZA) 8 MCG capsule Take 1 capsule (8 mcg total) by mouth 2 (two) times daily with a meal. 180 capsule 1   meclizine (ANTIVERT) 12.5 MG tablet Take 12.5 mg by mouth daily as needed for dizziness.     montelukast (SINGULAIR) 10 MG tablet Take 1 tablet (10 mg total) by mouth at bedtime. 30 tablet 11   omega-3 acid ethyl esters (LOVAZA) 1 g capsule Take 2 capsules (2 g total) by mouth 2 (two) times daily. 120 capsule 3   omeprazole (PRILOSEC) 40 MG capsule Take 1 capsule (40 mg total) by mouth 2 (two) times daily. 180 capsule 3   ondansetron (ZOFRAN) 4 MG tablet Take 1 tablet (4 mg total) by mouth every 6 (six) hours as needed for nausea. 20 tablet 0   Potassium 99 MG TABS Take 1 tablet by mouth daily.     propranolol (INDERAL) 20 MG tablet Take one tablet 20 mg  twice daily  may take an  additional dose of 20 mg  as needed for palpitations 190 tablet 3   triamcinolone cream (KENALOG) 0.1  % Apply 1 application. topically 2 (two) times daily. 30 g 0   No facility-administered medications prior to visit.    Allergies  Allergen Reactions   Claritin [Loratadine] Other (See Comments)    Heart race   Loratadine-Pseudoephedrine Er Palpitations   Pollen Extract Other (See Comments)    Seasonal allergies    Review of Systems  Constitutional:  Positive for fatigue. Negative for chills and fever.  HENT:  Negative for congestion and sore throat.   Eyes:  Negative for pain and discharge.  Respiratory:  Positive for shortness of breath. Negative for cough.   Cardiovascular:  Positive for palpitations. Negative for chest pain.  Gastrointestinal:  Negative for diarrhea, nausea and vomiting.  Endocrine: Negative for polydipsia and polyuria.  Genitourinary:  Negative for dysuria and hematuria.  Musculoskeletal:  Positive for arthralgias, back pain, myalgias and neck pain. Negative for neck stiffness.  Neurological:  Positive for dizziness and tremors. Negative for headaches.  Psychiatric/Behavioral:  Positive for sleep disturbance. Negative for agitation and behavioral problems. The patient is nervous/anxious.        Objective:    Physical Exam Vitals reviewed.  Constitutional:      General: He is not in acute distress.    Appearance: He is not diaphoretic.  HENT:     Head: Normocephalic and atraumatic.     Nose: Nose normal.     Mouth/Throat:     Mouth: Mucous membranes are moist.  Eyes:     General: No scleral icterus.    Extraocular Movements: Extraocular movements intact.  Cardiovascular:     Rate and Rhythm: Normal rate and regular rhythm.     Pulses: Normal pulses.     Heart sounds: Normal heart sounds. No murmur heard. Pulmonary:     Breath sounds: Normal breath sounds. No wheezing or rales.  Musculoskeletal:        General: Tenderness (Mid thoracic and lumbar spine area) present.     Cervical back: Neck supple. No tenderness.     Right lower leg: No edema.      Left lower  leg: No edema.  Skin:    General: Skin is warm.     Coloration: Skin is not jaundiced.  Neurological:     General: No focal deficit present.     Mental Status: He is alert and oriented to person, place, and time.     Comments: Functional tremors on right hand  Psychiatric:        Mood and Affect: Mood is anxious and depressed. Affect is flat.        Thought Content: Thought content does not include homicidal or suicidal ideation.     BP 136/84   Pulse 84   Ht '6\' 3"'$  (1.905 m)   Wt 263 lb (119.3 kg)   SpO2 96%   BMI 32.87 kg/m  Wt Readings from Last 3 Encounters:  07/29/22 263 lb (119.3 kg)  06/10/22 261 lb 12.8 oz (118.8 kg)  05/29/22 258 lb 12.8 oz (117.4 kg)        Assessment & Plan:   Problem List Items Addressed This Visit       Musculoskeletal and Integument   DDD (degenerative disc disease), lumbar - Primary    Has had lumbar spine x-ray, which showed DDD and bone spurring - note received from chiropractor Has had spinal manipulation done by chiropractor Advised to avoid heavy lifting and frequent bending Flexeril as needed for muscle spasms Naproxen as needed for back pain Continue gabapentin for neuropathic pain Referred to spine surgery      Relevant Medications   naproxen (NAPROSYN) 500 MG tablet   Other Relevant Orders   Ambulatory referral to Spine Surgery     Other   Cervical spinal stenosis    Has had an MRI of cervical spine for investigation of hand tremors Showed cervical spinal stenosis Has chronic neck pain Naproxen as needed Referred to spine surgery       Relevant Medications   naproxen (NAPROSYN) 500 MG tablet   Other Relevant Orders   Ambulatory referral to Spine Surgery   Other Visit Diagnoses     Need for immunization against influenza       Relevant Orders   Flu Vaccine QUAD 6moIM (Fluarix, Fluzone & Alfiuria Quad PF) (Completed)        Meds ordered this encounter  Medications   naproxen (NAPROSYN)  500 MG tablet    Sig: Take 1 tablet (500 mg total) by mouth 2 (two) times daily with a meal.    Dispense:  60 tablet    Refill:  1     Elijah Shaffer KKeith Rake MD

## 2022-07-31 DIAGNOSIS — S338XXA Sprain of other parts of lumbar spine and pelvis, initial encounter: Secondary | ICD-10-CM | POA: Diagnosis not present

## 2022-07-31 DIAGNOSIS — S233XXA Sprain of ligaments of thoracic spine, initial encounter: Secondary | ICD-10-CM | POA: Diagnosis not present

## 2022-07-31 DIAGNOSIS — M4004 Postural kyphosis, thoracic region: Secondary | ICD-10-CM | POA: Diagnosis not present

## 2022-07-31 DIAGNOSIS — S134XXA Sprain of ligaments of cervical spine, initial encounter: Secondary | ICD-10-CM | POA: Diagnosis not present

## 2022-07-31 DIAGNOSIS — M7061 Trochanteric bursitis, right hip: Secondary | ICD-10-CM | POA: Diagnosis not present

## 2022-08-01 ENCOUNTER — Other Ambulatory Visit: Payer: Self-pay | Admitting: Internal Medicine

## 2022-08-05 DIAGNOSIS — S134XXA Sprain of ligaments of cervical spine, initial encounter: Secondary | ICD-10-CM | POA: Diagnosis not present

## 2022-08-05 DIAGNOSIS — S338XXA Sprain of other parts of lumbar spine and pelvis, initial encounter: Secondary | ICD-10-CM | POA: Diagnosis not present

## 2022-08-05 DIAGNOSIS — M7061 Trochanteric bursitis, right hip: Secondary | ICD-10-CM | POA: Diagnosis not present

## 2022-08-05 DIAGNOSIS — S233XXA Sprain of ligaments of thoracic spine, initial encounter: Secondary | ICD-10-CM | POA: Diagnosis not present

## 2022-08-05 DIAGNOSIS — M4004 Postural kyphosis, thoracic region: Secondary | ICD-10-CM | POA: Diagnosis not present

## 2022-08-09 DIAGNOSIS — M4714 Other spondylosis with myelopathy, thoracic region: Secondary | ICD-10-CM | POA: Diagnosis not present

## 2022-08-12 ENCOUNTER — Other Ambulatory Visit (HOSPITAL_COMMUNITY): Payer: Self-pay | Admitting: Neurological Surgery

## 2022-08-12 ENCOUNTER — Other Ambulatory Visit: Payer: Self-pay | Admitting: Neurological Surgery

## 2022-08-12 DIAGNOSIS — M4714 Other spondylosis with myelopathy, thoracic region: Secondary | ICD-10-CM

## 2022-08-12 DIAGNOSIS — S134XXA Sprain of ligaments of cervical spine, initial encounter: Secondary | ICD-10-CM | POA: Diagnosis not present

## 2022-08-12 DIAGNOSIS — M4004 Postural kyphosis, thoracic region: Secondary | ICD-10-CM | POA: Diagnosis not present

## 2022-08-12 DIAGNOSIS — S338XXA Sprain of other parts of lumbar spine and pelvis, initial encounter: Secondary | ICD-10-CM | POA: Diagnosis not present

## 2022-08-12 DIAGNOSIS — M7061 Trochanteric bursitis, right hip: Secondary | ICD-10-CM | POA: Diagnosis not present

## 2022-08-12 DIAGNOSIS — S233XXA Sprain of ligaments of thoracic spine, initial encounter: Secondary | ICD-10-CM | POA: Diagnosis not present

## 2022-08-14 IMAGING — DX DG CHEST 2V
2 series · 2 of 2 positions shown · non-contrast
Comparison: 10/02/2021

CLINICAL DATA: Shortness of breath, dyspnea on exertion, some chest
pain and lightheadedness

EXAM:
CHEST - 2 VIEW

[chest pa]
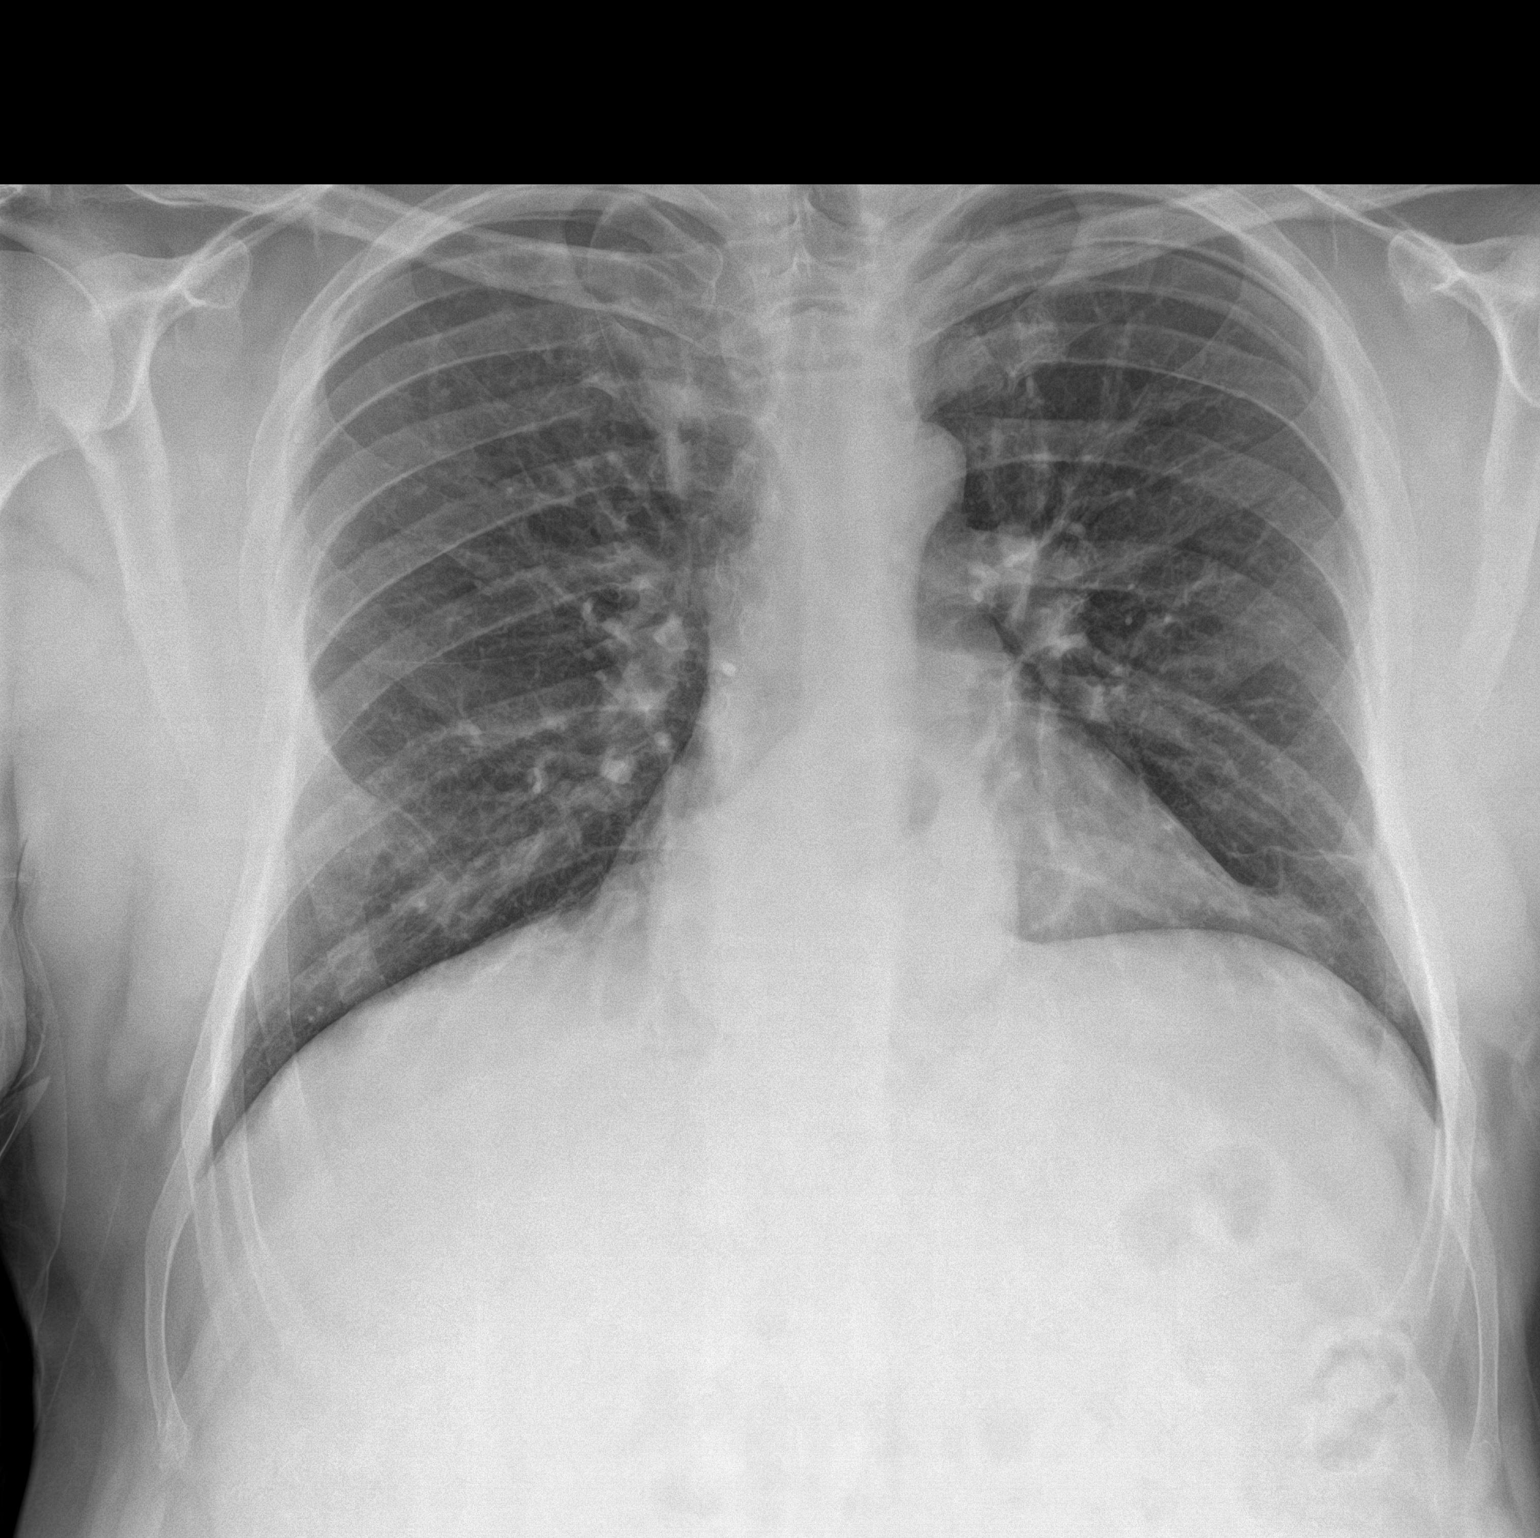

[chest lat]
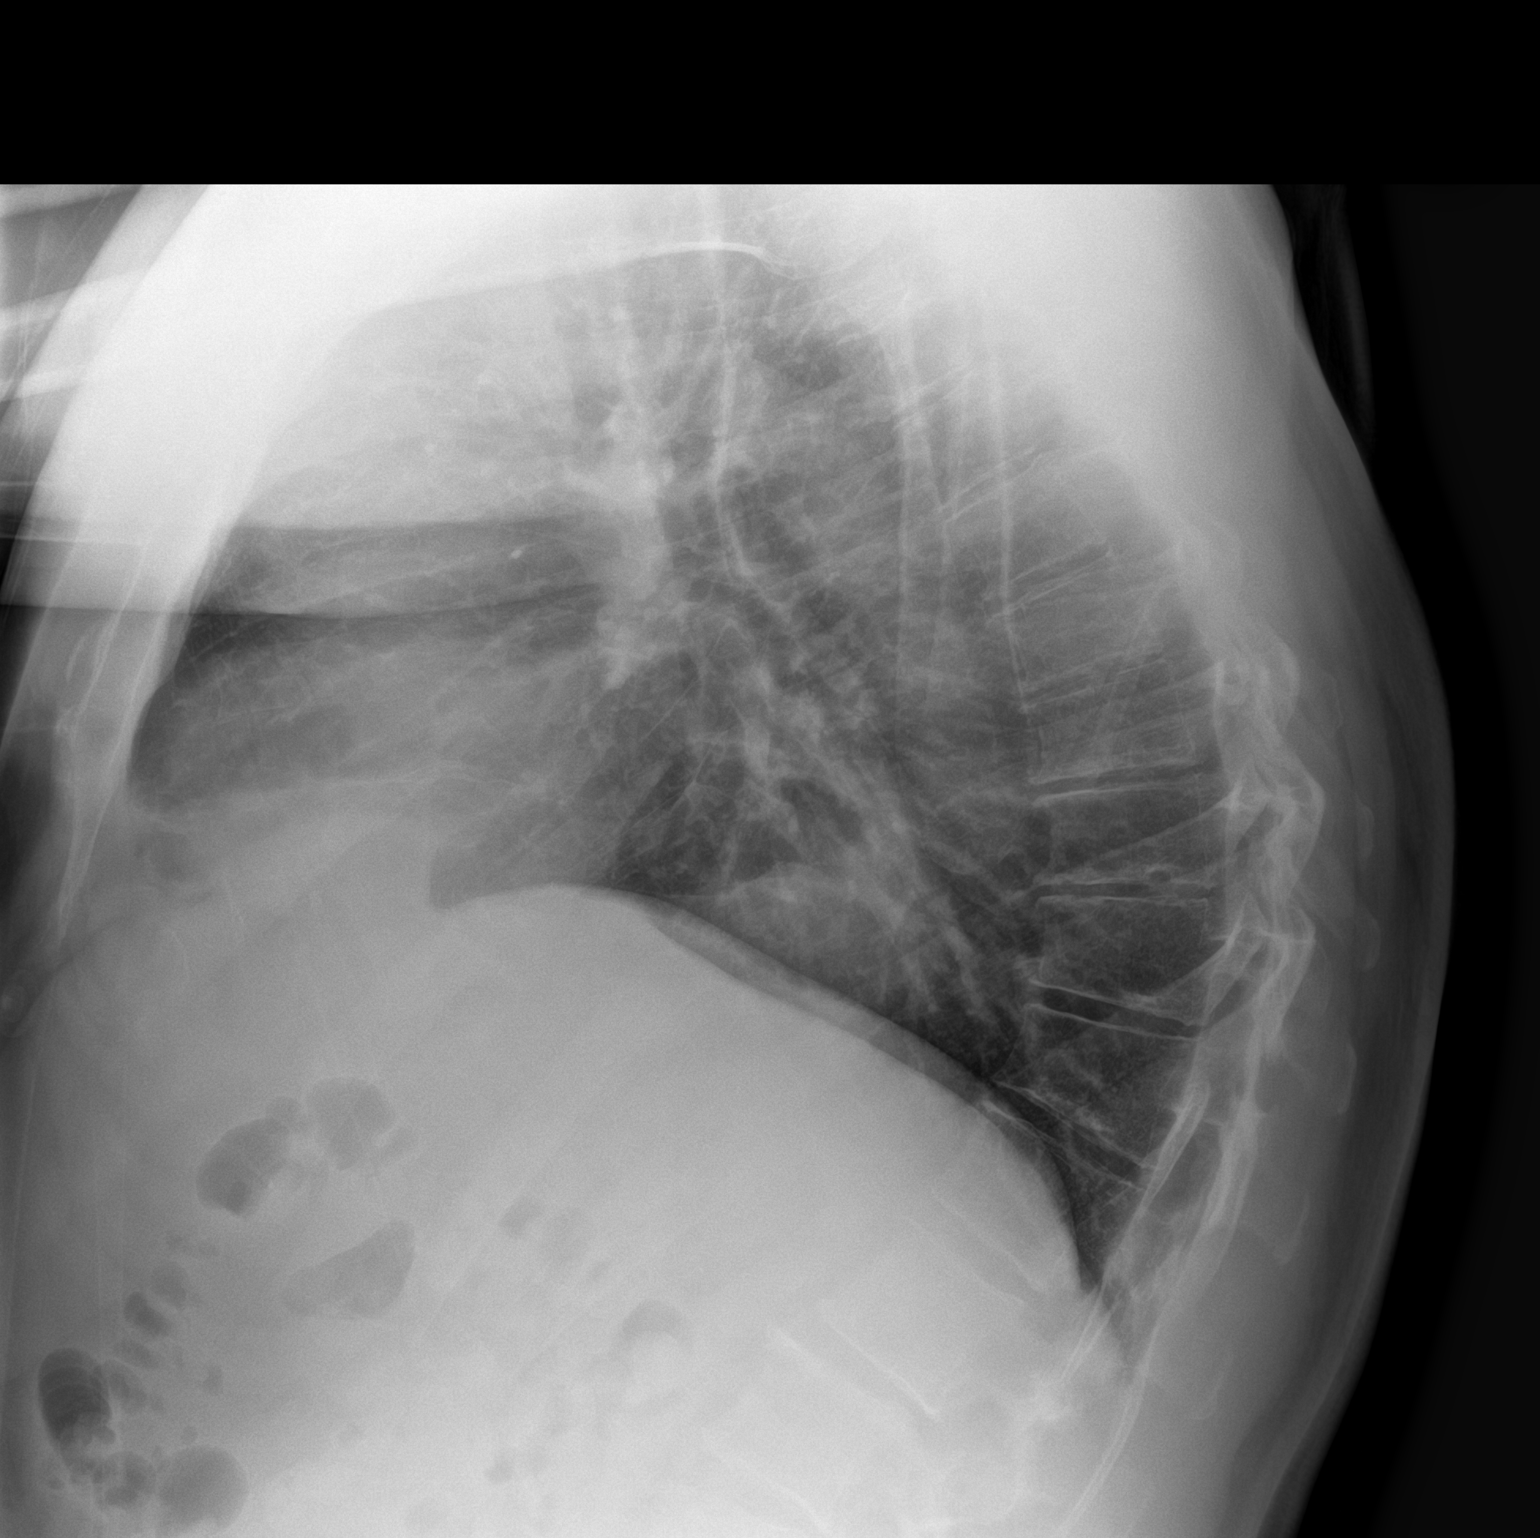

[2 of 2 positions shown; findings below may reference images not displayed]

FINDINGS: Normal heart size, mediastinal contours, and pulmonary vascularity.

Moderate-sized hiatal hernia.

Azygous fissure noted.

Minimal scarring at lingula.

Remaining lungs clear.

No acute infiltrate, pleural effusion, or pneumothorax.

No acute osseous findings.

Chronic height loss of a lower thoracic vertebra and bifid anterior
RIGHT fifth rib noted.
IMPRESSION: Moderate-sized hiatal hernia.

Lingular scarring without acute infiltrate.

## 2022-09-04 ENCOUNTER — Ambulatory Visit (HOSPITAL_COMMUNITY)
Admission: RE | Admit: 2022-09-04 | Discharge: 2022-09-04 | Disposition: A | Discharge status: Home / Self Care | Payer: 59 | Source: Ambulatory Visit | Attending: Neurological Surgery | Admitting: Neurological Surgery

## 2022-09-04 DIAGNOSIS — M4714 Other spondylosis with myelopathy, thoracic region: Secondary | ICD-10-CM | POA: Diagnosis not present

## 2022-09-04 DIAGNOSIS — M40204 Unspecified kyphosis, thoracic region: Secondary | ICD-10-CM | POA: Diagnosis not present

## 2022-09-04 MED ORDER — GADOBUTROL 1 MMOL/ML IV SOLN
10.0000 mL | Freq: Once | INTRAVENOUS | Status: AC | PRN
  Administered 2022-09-04: 10 mL via INTRAVENOUS

## 2022-09-16 ENCOUNTER — Ambulatory Visit: Payer: 59 | Admitting: Dermatology

## 2022-09-16 DIAGNOSIS — L219 Seborrheic dermatitis, unspecified: Secondary | ICD-10-CM

## 2022-09-16 DIAGNOSIS — L2089 Other atopic dermatitis: Secondary | ICD-10-CM

## 2022-09-16 DIAGNOSIS — L700 Acne vulgaris: Secondary | ICD-10-CM

## 2022-09-16 DIAGNOSIS — L814 Other melanin hyperpigmentation: Secondary | ICD-10-CM | POA: Diagnosis not present

## 2022-09-16 DIAGNOSIS — L820 Inflamed seborrheic keratosis: Secondary | ICD-10-CM

## 2022-09-16 DIAGNOSIS — L209 Atopic dermatitis, unspecified: Secondary | ICD-10-CM

## 2022-09-16 MED ORDER — CLOBETASOL PROPIONATE 0.05 % EX CREA: TOPICAL_CREAM | CUTANEOUS | 0 refills | Status: DC

## 2022-09-16 MED ORDER — TACROLIMUS 0.1 % EX OINT: TOPICAL_OINTMENT | CUTANEOUS | 0 refills | Status: DC

## 2022-09-16 MED ORDER — FLUOCINOLONE ACETONIDE 0.01 % OT OIL: TOPICAL_OIL | OTIC | 2 refills | Status: DC

## 2022-09-16 NOTE — Patient Instructions (Addendum)
For scalp  Continue ketoconazole shampoo 2% daily as needed   Continue lidex solution apply topically to itchy areas at scalp as needed.    For itchy scale inside ears  Start fluocinolone acetonide 0.01 % apply 1 - 2 drops inside 1 - 2 times daily as needed for scale.   For rash at right ankle, face and body  Start tacroliums 0.1 % ointment - apply topically 1 - 2 times daily as needed to  affected areas of body and face for rash  Start clobetasol cream 0.05 % apply small amount to affected areas at right ankle or other flared area at body daily before bed as needed for itchy rash. Discontinue when clear. Avoid applying to face, groin, and axilla. Use as directed. Long-term use can cause thinning of the skin.  Topical steroids (such as triamcinolone, fluocinolone, fluocinonide, mometasone, clobetasol, halobetasol, betamethasone, hydrocortisone) can cause thinning and lightening of the skin if they are used for too long in the same area. Your physician has selected the right strength medicine for your problem and area affected on the body. Please use your medication only as directed by your physician to prevent side effects.    For bump at nose  Apply effaclear - apply small amount nightly before bed.     Due to recent changes in healthcare laws, you may see results of your pathology and/or laboratory studies on MyChart before the doctors have had a chance to review them. We understand that in some cases there may be results that are confusing or concerning to you. Please understand that not all results are received at the same time and often the doctors may need to interpret multiple results in order to provide you with the best plan of care or course of treatment. Therefore, we ask that you please give [place] 2 business days to thoroughly review all your results before contacting the office for clarification. Should we see a critical lab result, you will be contacted sooner.   If You  Need Anything After Your Visit  If you have any questions or concerns for your doctor, please call our main line at [phone] and press option 4 to reach your doctor's medical assistant. If no one answers, please leave a voicemail as directed and we will return your call as soon as possible. Messages left after 4 pm will be answered the following business day.   You may also send [place] a message via [place]. We typically respond to MyChart messages within 1-2 business days.  For prescription refills, please ask your pharmacy to contact our office. Our fax number is [phone].  If you have an urgent issue when the clinic is closed that cannot wait until the next business day, you can page your doctor at the number below.    Please note that while we do our best to be available for urgent issues outside of office hours, we are not available 24/7.   If you have an urgent issue and are unable to reach [place], you may choose to seek medical care at your doctor's office, retail clinic, urgent care center, or emergency room.  If you have a medical emergency, please immediately call 911 or go to the emergency department.  Pager Numbers  - Dr. Nehemiah Massed: [phone]  - Dr. Laurence Ferrari: [phone]  - Dr. Nicole Kindred: [phone]  In the event of inclement weather, please call our main line at [phone] for an update on the status of any delays or closures.  Dermatology  Medication Tips: Please keep the boxes that topical medications come in in order to help keep track of the instructions about where and how to use these. Pharmacies typically print the medication instructions only on the boxes and not directly on the medication tubes.   If your medication is too expensive, please contact our office at [phone] option 4 or send [place] a message through [place].   We are unable to tell what your co-pay for medications will be in advance as this is different depending on your insurance coverage. However,  we may be able to find a substitute medication at lower cost or fill out paperwork to get insurance to cover a needed medication.   If a prior authorization is required to get your medication covered by your insurance company, please allow [place] 1-2 business days to complete this process.  Drug prices often vary depending on where the prescription is filled and some pharmacies may offer cheaper prices.  The website www.goodrx.com contains coupons for medications through different pharmacies. The prices here do not account for what the cost may be with help from insurance (it may be cheaper with your insurance), but the website can give you the price if you did not use any insurance.  - You can print the associated coupon and take it with your prescription to the pharmacy.  - You may also stop by our office during regular business hours and pick up a GoodRx coupon card.  - If you need your prescription sent electronically to a different pharmacy, notify our office through [facility] or by phone at [phone] option 4.     Si Usted Necesita Algo Despus de Su Visita  Tambin puede enviarnos un mensaje a travs de Pharmacist, community. Por lo general respondemos a los mensajes de MyChart en el transcurso de 1 a 2 das hbiles.  Para renovar recetas, por favor pida a su farmacia que se ponga en contacto con nuestra oficina. Harland Dingwall de fax es [place] [phone].  Si tiene un asunto urgente cuando la clnica est cerrada y que no puede esperar hasta el siguiente da hbil, puede llamar/localizar a su doctor(a) al nmero que aparece a continuacin.   Por favor, tenga en cuenta que aunque hacemos todo lo posible para estar disponibles para asuntos urgentes fuera del horario de [place], no estamos disponibles las 24 horas del da, los 7 das de la [place].   Si tiene un problema urgente y no puede comunicarse con nosotros, puede optar por buscar atencin mdica  en el consultorio de su doctor(a), en una  clnica privada, en un centro de atencin urgente o en una sala de emergencias.  Si tiene Engineering geologist, por favor llame inmediatamente al 911 o vaya a la sala de emergencias.  Nmeros de bper  - Dr. Nehemiah Massed: [phone]  - Dra. Moye: [phone]  - Dra. Nicole Kindred: [phone]  En caso de inclemencias del [place], por favor llame a Johnsie Kindred principal al [phone] para una actualizacin sobre el [place] de cualquier retraso o cierre.  Consejos para la medicacin en dermatologa: Por favor, guarde las cajas en las que vienen los medicamentos de uso tpico para ayudarle a seguir las instrucciones sobre dnde y cmo usarlos. Las farmacias generalmente imprimen las instrucciones del medicamento slo en las cajas y no directamente en los tubos del [place].   Si su medicamento es muy caro, por favor, pngase en contacto con Zigmund Daniel llamando al [phone] y presione la opcin 4 o envenos un mensaje  a travs de MyChart.   No podemos decirle cul ser su copago por los medicamentos por adelantado ya que esto es diferente dependiendo de la cobertura de su seguro. Sin embargo, es posible que podamos encontrar un medicamento sustituto a Electrical engineer un formulario para que el seguro cubra el medicamento que se considera necesario.   Si se requiere una autorizacin previa para que su compaa de seguros [place] su medicamento, por favor permtanos de 1 a 2 das hbiles para completar este proceso.  Los precios de los medicamentos varan con frecuencia dependiendo del Environmental consultant de dnde se surte la receta y alguna farmacias pueden ofrecer precios ms baratos.  El sitio web www.goodrx.com tiene cupones para medicamentos de Airline pilot. Los precios aqu no tienen en cuenta lo que podra costar con la ayuda del seguro (puede ser ms barato con su seguro), pero el sitio web puede darle el precio si no utiliz Research scientist (physical sciences).  - Puede imprimir el cupn correspondiente y  llevarlo con su receta a la farmacia.  - Tambin puede pasar por nuestra oficina durante el horario de atencin regular y Charity fundraiser una tarjeta de cupones de GoodRx.  - Si necesita que su receta se enve electrnicamente a una farmacia diferente, informe a nuestra oficina a travs de MyChart de [place] o por telfono llamando al [phone] y presione la opcin 4.

## 2022-09-16 NOTE — Progress Notes (Signed)
Follow-Up Visit   Subjective  Elijah Shaffer is a 41 y.o. male who presents for the following: Follow-up (Patient reports a spot at nose that he noticed 2 weeks ago. Hx of atopic derm at right ankle and left lower leg, patient continue to use cerave/clobetasol mix as needed. Reports itchy area at right ankle. 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 Patient still using ketoconazole shampoo daily for itchy scalp. Also using lidex solution to itchy spots on scalp. Patient reports still itchy. Also reports some itchy scale inside ears. ).  The patient has spots, moles and lesions to be evaluated, some may be new or changing and the patient has concerns that these could be cancer.   The following portions of the chart were reviewed this encounter and updated as appropriate:      Review of Systems: No other skin or systemic complaints except as noted in HPI or Assessment and Plan.   Objective  Well appearing patient in no apparent distress; mood and affect are within normal limits.  A focused examination was performed including face, ears, arms, right ankle, left lower leg. Relevant physical exam findings are noted in the Assessment and Plan.  scalp / b/l ear canals Pink scaliness of scalp and ears    right ankle, body, face Pink scaly patch at right ankle   Scalp Scattered pink scaly excoriations at scalp  nasal dorsum Open comedone at nasal dorsum   Assessment & Plan  Seborrheic dermatitis scalp / b/l ear canals  Chronic and persistent condition with duration or expected duration over one year. Condition is symptomatic/ bothersome to patient. Not currently at goal.   Seborrheic Dermatitis  -  is a chronic persistent rash characterized by pinkness and scaling most commonly of the mid face but also can occur on the scalp (dandruff), ears; mid chest, mid back and groin.  It tends to be exacerbated by stress and cooler weather.  People who have neurologic disease may experience new onset or  exacerbation of existing seborrheic dermatitis.  The condition is not curable but treatable and can be controlled.  Continue ketoconazole shampoo 2% apply topically qd prn for itchy dry scalp.   Start fluocinolone acetonide 0.01% oil - apply 1 - 2 drops qd / bid prn for scale inside ears.    Fluocinolone Acetonide 0.01 % OIL - scalp / b/l ear canals Use 1 - 2 drop qd/bid prn for scale at ears  Related Medications ketoconazole (NIZORAL) 2 % shampoo Apply 1 Application topically daily as needed for irritation.  Atopic dermatitis, unspecified type right ankle, body, face  Chronic and persistent condition with duration or expected duration over one year. Condition is symptomatic/ bothersome to patient. Improving but not currently at goal. Pt continues to have itchy patches all over, and has to use topicals   Atopic dermatitis (eczema) is a chronic, relapsing, pruritic condition that can significantly affect quality of life. It is often associated with allergic rhinitis and/or asthma and can require treatment with topical medications, phototherapy, or in severe cases biologic injectable medication (Dupixent; Adbry) or Oral JAK inhibitors.  Discussed Dupixent, will consider at next follow up if topicals do not help.   For itchy rash at right ankle  Start clobetasol cream 0.05 % - apply small amount before bed daily as needed for right ankle or other flared areas of body. D/c if clear. Avoid applying to face, groin, and axilla. Use as directed. Long-term use can cause thinning of the skin.  Start  tacrolimus 0.1 % ointment - apply topically to affected areas of body and face for rash qd/bid prn.    Continue CeraVe/Clobetasol mixture twice daily up to 2 weeks as needed for rash/itching on body. Don't need to use it on ankle while using clobetasol cream Continue Hydroxyzine 25 mg 1 tablet every 8 hours as needed for itching.   Topical steroids (such as triamcinolone, fluocinolone, fluocinonide,  mometasone, clobetasol, halobetasol, betamethasone, hydrocortisone) can cause thinning and lightening of the skin if they are used for too long in the same area. Your physician has selected the right strength medicine for your problem and area affected on the body. Please use your medication only as directed by your physician to prevent side effects.     tacrolimus (PROTOPIC) 0.1 % ointment - right ankle, body, face Apply topically to affected areas of body for rash qd/bid prn  Related Medications clobetasol cream (TEMOVATE) 0.05 % Apply small amount daily before bed to right ankle or other areas of body as needed for itchy rash Avoid applying to face, groin, and axilla Use as directed.  Inflamed seborrheic keratosis Scalp  Vs prurigo nodule vrs seb derm, improving but not at goal  Continue Lidex scalp solution BID to affected area on scalp up to 2 weeks as needed for itch.  Avoid applying to face, groin, and axilla. Use as directed. Long-term use can cause thinning of the skin.   Topical steroids (such as triamcinolone, fluocinolone, fluocinonide, mometasone, clobetasol, halobetasol, betamethasone, hydrocortisone) can cause thinning and lightening of the skin if they are used for too long in the same area. Your physician has selected the right strength medicine for your problem and area affected on the body. Please use your medication only as directed by your physician to prevent side effects.   Related Medications fluocinonide (LIDEX) 0.05 % external solution Apply once or twice daily to affected areas on scalp up to 2 weeks as needed for itching. Avoid applying to face, groin, and axilla.  Open comedone nasal dorsum  Benign. Observe.  Discussed could extract if bothersome  Start sample of effaclar nightly to affected area. Will recheck at next follow up. May consider acne surgery on f/up since it has been sore  Other atopic dermatitis  Related Medications clobetasol (TEMOVATE)  0.05 % external solution Apply 1 application. topically 2 (two) times daily. Use as directed.  Mix with cerave cream. Use for up to 4 weeks. Avoid applying to face, groin, and axilla. Use as directed.  hydrOXYzine (VISTARIL) 25 MG capsule Take 1 capsule (25 mg total) by mouth every 8 (eight) hours as needed. Take before bed as needed for itchy. Can cause drowsiness.   Lentigines - Scattered tan macules - Due to sun exposure - Benign-appearing, observe - Recommend daily broad spectrum sunscreen SPF 30+ to sun-exposed areas, reapply every 2 hours as needed. - Call for any changes  Return in about 1 month (around 10/16/2022) for atopic derm, seb derm follow up. I, Ruthell Rummage, CMA, am acting as scribe for [name], MD.   Documentation: I have reviewed the above documentation for accuracy and completeness, and I agree with the above.  [name] MD

## 2022-09-18 DIAGNOSIS — R2689 Other abnormalities of gait and mobility: Secondary | ICD-10-CM | POA: Diagnosis not present

## 2022-09-18 DIAGNOSIS — R030 Elevated blood-pressure reading, without diagnosis of hypertension: Secondary | ICD-10-CM | POA: Diagnosis not present

## 2022-09-18 DIAGNOSIS — R42 Dizziness and giddiness: Secondary | ICD-10-CM | POA: Diagnosis not present

## 2022-09-18 DIAGNOSIS — R251 Tremor, unspecified: Secondary | ICD-10-CM | POA: Diagnosis not present

## 2022-09-19 ENCOUNTER — Telehealth: Payer: Self-pay

## 2022-09-19 NOTE — Telephone Encounter (Signed)
Patient left VM that he current has a flare on his scalp from something previous frozen. He states this area is inflamed and comes up as knot then it will slowly fade away.   Patient was seen 06/11/2022 and you froze inflamed SK vs prurigo nodule and then reexamined 09/16/22.

## 2022-09-20 DIAGNOSIS — R270 Ataxia, unspecified: Secondary | ICD-10-CM | POA: Diagnosis not present

## 2022-09-20 DIAGNOSIS — M5416 Radiculopathy, lumbar region: Secondary | ICD-10-CM | POA: Diagnosis not present

## 2022-09-24 NOTE — Telephone Encounter (Signed)
Called patient, no answer. Left voicemail. aw

## 2022-09-24 NOTE — Telephone Encounter (Signed)
Made patient appointment and left details on voice mail. Patient to call back if he can not make appointment tomorrow. aw

## 2022-09-25 ENCOUNTER — Ambulatory Visit: Payer: 59 | Admitting: Dermatology

## 2022-09-25 DIAGNOSIS — L700 Acne vulgaris: Secondary | ICD-10-CM | POA: Diagnosis not present

## 2022-09-25 DIAGNOSIS — L7211 Pilar cyst: Secondary | ICD-10-CM | POA: Diagnosis not present

## 2022-09-25 MED ORDER — DOXYCYCLINE HYCLATE 100 MG PO CAPS: 100.0000 mg | ORAL_CAPSULE | Freq: Two times a day (BID) | ORAL | 0 refills | Status: AC

## 2022-09-25 NOTE — Patient Instructions (Addendum)
Start doxycycline '100mg'$  take 1 capsule by mouth twice a day with food x 2 weeks. Doxycycline should be taken with food to prevent nausea. Do not lay down for 30 minutes after taking. Be cautious with sun exposure and use good sun protection while on this medication. Pregnant women should not take this medication.   Continue fluocinonide solution once to twice daily to affected area scalp as needed for itch.   Due to recent changes in healthcare laws, you may see results of your pathology and/or laboratory studies on MyChart before the doctors have had a chance to review them. We understand that in some cases there may be results that are confusing or concerning to you. Please understand that not all results are received at the same time and often the doctors may need to interpret multiple results in order to provide you with the best plan of care or course of treatment. Therefore, we ask that you please give [place] 2 business days to thoroughly review all your results before contacting the office for clarification. Should we see a critical lab result, you will be contacted sooner.   If You Need Anything After Your Visit  If you have any questions or concerns for your doctor, please call our main line at [phone] and press option 4 to reach your doctor's medical assistant. If no one answers, please leave a voicemail as directed and we will return your call as soon as possible. Messages left after 4 pm will be answered the following business day.   You may also send [place] a message via [place]. We typically respond to MyChart messages within 1-2 business days.  For prescription refills, please ask your pharmacy to contact our office. Our fax number is [phone].  If you have an urgent issue when the clinic is closed that cannot wait until the next business day, you can page your doctor at the number below.    Please note that while we do our best to be available for urgent issues outside of office  hours, we are not available 24/7.   If you have an urgent issue and are unable to reach [place], you may choose to seek medical care at your doctor's office, retail clinic, urgent care center, or emergency room.  If you have a medical emergency, please immediately call 911 or go to the emergency department.  Pager Numbers  - Dr. Nehemiah Massed: [phone]  - Dr. Laurence Ferrari: [phone]  - Dr. Nicole Kindred: [phone]  In the event of inclement weather, please call our main line at [phone] for an update on the status of any delays or closures.  Dermatology Medication Tips: Please keep the boxes that topical medications come in in order to help keep track of the instructions about where and how to use these. Pharmacies typically print the medication instructions only on the boxes and not directly on the medication tubes.   If your medication is too expensive, please contact our office at [phone] option 4 or send [place] a message through [place].   We are unable to tell what your co-pay for medications will be in advance as this is different depending on your insurance coverage. However, we may be able to find a substitute medication at lower cost or fill out paperwork to get insurance to cover a needed medication.   If a prior authorization is required to get your medication covered by your insurance company, please allow [place] 1-2 business days to complete this process.  Drug prices often vary depending  on where the prescription is filled and some pharmacies may offer cheaper prices.  The website www.goodrx.com contains coupons for medications through different pharmacies. The prices here do not account for what the cost may be with help from insurance (it may be cheaper with your insurance), but the website can give you the price if you did not use any insurance.  - You can print the associated coupon and take it with your prescription to the pharmacy.  - You may also stop by our office during regular  business hours and pick up a GoodRx coupon card.  - If you need your prescription sent electronically to a different pharmacy, notify our office through [facility] or by phone at [phone] option 4.     Si Usted Necesita Algo Despus de Su Visita  Tambin puede enviarnos un mensaje a travs de Pharmacist, community. Por lo general respondemos a los mensajes de MyChart en el transcurso de 1 a 2 das hbiles.  Para renovar recetas, por favor pida a su farmacia que se ponga en contacto con nuestra oficina. Harland Dingwall de fax es [place] [phone].  Si tiene un asunto urgente cuando la clnica est cerrada y que no puede esperar hasta el siguiente da hbil, puede llamar/localizar a su doctor(a) al nmero que aparece a continuacin.   Por favor, tenga en cuenta que aunque hacemos todo lo posible para estar disponibles para asuntos urgentes fuera del horario de [place], no estamos disponibles las 24 horas del da, los 7 das de la [place].   Si tiene un problema urgente y no puede comunicarse con nosotros, puede optar por buscar atencin mdica  en el consultorio de su doctor(a), en una clnica privada, en un centro de atencin urgente o en una sala de emergencias.  Si tiene Engineering geologist, por favor llame inmediatamente al 911 o vaya a la sala de emergencias.  Nmeros de bper  - Dr. Nehemiah Massed: [phone]  - Dra. Moye: [phone]  - Dra. Nicole Kindred: [phone]  En caso de inclemencias del [place], por favor llame a Johnsie Kindred principal al [phone] para una actualizacin sobre el [place] de cualquier retraso o cierre.  Consejos para la medicacin en dermatologa: Por favor, guarde las cajas en las que vienen los medicamentos de uso tpico para ayudarle a seguir las instrucciones sobre dnde y cmo usarlos. Las farmacias generalmente imprimen las instrucciones del medicamento slo en las cajas y no directamente en los tubos del [place].   Si su medicamento es muy caro, por  favor, pngase en contacto con Zigmund Daniel llamando al [phone] y presione la opcin 4 o envenos un mensaje a travs de Pharmacist, community.   No podemos decirle cul ser su copago por los medicamentos por adelantado ya que esto es diferente dependiendo de la cobertura de su seguro. Sin embargo, es posible que podamos encontrar un medicamento sustituto a Electrical engineer un formulario para que el seguro cubra el medicamento que se considera necesario.   Si se requiere una autorizacin previa para que su compaa de seguros [place] su medicamento, por favor permtanos de 1 a 2 das hbiles para completar este proceso.  Los precios de los medicamentos varan con frecuencia dependiendo del Environmental consultant de dnde se surte la receta y alguna farmacias pueden ofrecer precios ms baratos.  El sitio web www.goodrx.com tiene cupones para medicamentos de Airline pilot. Los precios aqu no tienen en cuenta lo que podra costar con la ayuda del seguro (puede ser ms barato con su seguro), [employer]  el sitio web puede darle el precio si no Field seismologist.  - Puede imprimir el cupn correspondiente y llevarlo con su receta a la farmacia.  - Tambin puede pasar por nuestra oficina durante el horario de atencin regular y Charity fundraiser una tarjeta de cupones de GoodRx.  - Si necesita que su receta se enve electrnicamente a una farmacia diferente, informe a nuestra oficina a travs de MyChart de [place] o por telfono llamando al [phone] y presione la opcin 4.

## 2022-09-25 NOTE — Progress Notes (Signed)
   Follow-Up Visit   Subjective  Elijah Shaffer is a 41 y.o. 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 Area was treated with cryotherapy 06/11/22 and he has been using Lidex solution prn itch. Area had gotten larger and painful 2 nights ago. Patient would like checked. He also has comedone of the nose that he has been using effaclar gel to treat. ).   The following portions of the chart were reviewed this encounter and updated as appropriate:       Review of Systems:  No other skin or systemic complaints except as noted in HPI or Assessment and Plan.  Objective  Well appearing patient in no apparent distress; mood and affect are within normal limits.  A focused examination was performed including face, scalp. Relevant physical exam findings are noted in the Assessment and Plan.  Upper Occipital Scalp 1.0 CM firm sq nodule with mild overlying erythema and scale  nasal dorsum Open comedone    Assessment & Plan  Pilar cyst Upper Occipital Scalp  Inflamed. Benign-appearing. Exam most consistent with an epidermal inclusion cyst. Discussed that a cyst is a benign growth that can grow over time and sometimes get irritated or inflamed. Recommend observation if it is not bothersome. Discussed option of surgical excision to remove it if it is growing, symptomatic, or other changes noted. Please call for new or changing lesions so they can be evaluated.  Start doxycycline 100 MG take 1 po BID with food x 2 weeks dsp #28 0Rf. Doxycycline should be taken with food to prevent nausea. Do not lay down for 30 minutes after taking. Be cautious with sun exposure and use good sun protection while on this medication. Pregnant women should not take this medication.   Continue Lidex Scalp Solution QD/BID prn itch.   Avoid pressing/scratching area. Recheck on follow-up.     doxycycline (VIBRAMYCIN) 100 MG capsule - Upper Occipital Scalp Take  1 capsule (100 mg total) by mouth 2 (two) times daily for 14 days. Take with food.  Open comedone nasal dorsum  With multiple ingrown hairs. Acne surgery performed today. It may need punch excision to remove resulting large dilated pore.  Apply Vaseline daily until healed, then may restart effaclar gel.   Acne/Milia surgery - nasal dorsum Procedure risks and benefits were discussed with the patient and verbal consent was obtained. Following prep of the skin on the dorsum nose with an alcohol swab and 1% lidocaine with epinephrine, extraction of comedones was performed with an #11 blade and cotton swabs . Vaseline ointment was applied to each site. The patient tolerated the procedure well.   Return as scheduled, for f/u. Also recheck cyst of the scalp.Lindi Adie, CMA, am acting as scribe for [name], MD .  Documentation: I have reviewed the above documentation for accuracy and completeness, and I agree with the above.  [name] MD

## 2022-09-26 DIAGNOSIS — M5416 Radiculopathy, lumbar region: Secondary | ICD-10-CM | POA: Insufficient documentation

## 2022-10-07 ENCOUNTER — Other Ambulatory Visit: Payer: Self-pay | Admitting: Dermatology

## 2022-10-07 DIAGNOSIS — L209 Atopic dermatitis, unspecified: Secondary | ICD-10-CM

## 2022-10-14 ENCOUNTER — Ambulatory Visit: Payer: 59 | Admitting: Dermatology

## 2022-10-14 DIAGNOSIS — L7211 Pilar cyst: Secondary | ICD-10-CM | POA: Diagnosis not present

## 2022-10-14 DIAGNOSIS — L309 Dermatitis, unspecified: Secondary | ICD-10-CM | POA: Diagnosis not present

## 2022-10-14 DIAGNOSIS — L219 Seborrheic dermatitis, unspecified: Secondary | ICD-10-CM | POA: Diagnosis not present

## 2022-10-14 DIAGNOSIS — L2089 Other atopic dermatitis: Secondary | ICD-10-CM | POA: Diagnosis not present

## 2022-10-14 MED ORDER — BETAMETHASONE DIPROPIONATE 0.05 % EX LOTN: TOPICAL_LOTION | CUTANEOUS | 2 refills | Status: DC

## 2022-10-14 MED ORDER — HYDROXYZINE PAMOATE 25 MG PO CAPS: 25.0000 mg | ORAL_CAPSULE | Freq: Three times a day (TID) | ORAL | 2 refills | Status: DC | PRN

## 2022-10-14 NOTE — Progress Notes (Signed)
Follow-Up Visit   Subjective  Elijah Shaffer is a 41 y.o. male who presents for the following: Follow-up (Patient here for 1 month seb derm, atopic derm follow up. Also recheck pilar cyst. ), Seborrheic Dermatitis (Patient still having itching at scalp. Currently using ketoconazole 2% shampoo and fluocinonide at scalp and fluocinolone oil to ears. ), and Dermatitis (Patient using clobetasol cream, tacrolimus, clobetasol/Cerave mix and taking hydroxyzine. Still with itching but much better. ). Cyst is doing better after taking doxycycline.  Still has some itching in scalp.   The following portions of the chart were reviewed this encounter and updated as appropriate:       Review of Systems:  No other skin or systemic complaints except as noted in HPI or Assessment and Plan.  Objective  Well appearing patient in no apparent distress; mood and affect are within normal limits.  A focused examination was performed including legs, scalp, face. Relevant physical exam findings are noted in the Assessment and Plan.  Scalp Indistinct firm papule with overlapping patch of alopecia upper occipital scalp, alopecia 2ndary to scratching, pt c/o itching all over  right ankle, body, face Pink patch at right lateral ankle, left lateral lower leg, improved, but pt states he is itching all over  upper occipital scalp Subcutaneous nodule at scalp, decreased in size.     Assessment & Plan  Seborrheic dermatitis Scalp  Chronic and persistent condition with duration or expected duration over one year. Condition is symptomatic/ bothersome to patient. Not currently at goal.   Seborrheic Dermatitis with prurigo nodule -  is a chronic persistent rash characterized by pinkness and scaling most commonly of the mid face but also can occur on the scalp (dandruff), ears; mid chest, mid back and groin.  It tends to be exacerbated by stress and cooler weather.  People who have neurologic disease may experience  new onset or exacerbation of existing seborrheic dermatitis.  The condition is not curable but treatable and can be controlled.  Start betamethasone diprolene 0.05% lotion 1-2 times daily to affected areas at scalp as needed for itch.  Continue ketoconazole 2% shampoo apply three times per week, massage into scalp and leave in for 5-10 minutes before rinsing out. Continue fluocinolone oil 1-2 drops daily to ears as needed for itch.    betamethasone dipropionate 0.05 % lotion - Scalp Apply 1-2 times daily to affected itchy areas at scalp. Avoid applying to face, groin, and axilla. Use as directed. Long-term use can cause thinning of the skin.  Related Medications ketoconazole (NIZORAL) 2 % shampoo Apply 1 Application topically daily as needed for irritation.  Fluocinolone Acetonide 0.01 % OIL Use 1 - 2 drop qd/bid prn for scale at ears  Eczema, unspecified type right ankle, body, face  Chronic and persistent condition with duration or expected duration over one year. Condition is symptomatic/ bothersome to patient. Improving but not currently at goal.   Recommend mild soap and moisturizing cream 1-2 times daily.  Gentle skin care handout provided.   Continue clobetasol 0.05% cream to itchy spots at legs 1-2 times daily for up to 2 weeks as needed for itchy flares. Avoid applying to face, groin, and axilla. Use as directed. Long-term use can cause thinning of the skin. Continue Clob/CeraVe cream mix to aas body qd/bid prn itch Continue tacrolimus ointment 1-2 times daily. Continue hydroxyzine 25 mg at bedtime as needed for itch. May cause drowsiness.   Discussed adding Dupixent injections since pt has persistent itching.  Topical steroids (such as triamcinolone, fluocinolone, fluocinonide, mometasone, clobetasol, halobetasol, betamethasone, hydrocortisone) can cause thinning and lightening of the skin if they are used for too long in the same area. Your physician has selected the right  strength medicine for your problem and area affected on the body. Please use your medication only as directed by your physician to prevent side effects.    Pilar cyst upper occipital scalp  Improved on doxycycline  Benign-appearing. Exam most consistent with a pilar cyst. Discussed that a cyst is a benign growth that can grow over time and sometimes get irritated or inflamed. Recommend observation if it is not bothersome. Discussed option of surgical excision to remove it if it is growing, symptomatic, or other changes noted. Please call for new or changing lesions so they can be evaluated.  Discussed adding ILK injections if continues to be symptomatic.     Other atopic dermatitis  Related Medications clobetasol (TEMOVATE) 0.05 % external solution Apply 1 application. topically 2 (two) times daily. Use as directed.  Mix with cerave cream. Use for up to 4 weeks. Avoid applying to face, groin, and axilla. Use as directed.  hydrOXYzine (VISTARIL) 25 MG capsule Take 1 capsule (25 mg total) by mouth every 8 (eight) hours as needed. Take before bed as needed for itchy. Can cause drowsiness.   Return in about 2 months (around 12/15/2022) for Seb Derm, Eczema.  Graciella Belton, RMA, am acting as scribe for [name], MD .  Documentation: I have reviewed the above documentation for accuracy and completeness, and I agree with the above.  [name] MD

## 2022-10-14 NOTE — Patient Instructions (Addendum)
Continue clobetasol 0.05% cream to itchy spots at legs 1-2 times daily for up to 2 weeks as needed for itchy flares. Avoid applying to face, groin, and axilla. Use as directed. Long-term use can cause thinning of the skin. Continue tacrolimus 1-2 times daily to itchy spots at body. Continue hydroxyzine 25 mg at bedtime as needed for itch. May cause drowsiness.   Start betamethasone diprolene 0.05% lotion 1-2 times daily to affected areas at scalp as needed for itch. Avoid applying to face, groin, and axilla. Use as directed. Long-term use can cause thinning of the skin. Continue fluocinolone oil 1-2 drops daily to ears as needed for itch. Continue ketoconazole 2% shampoo apply three times per week, massage into scalp and leave in for 5-10 minutes before rinsing out.  Topical steroids (such as triamcinolone, fluocinolone, fluocinonide, mometasone, clobetasol, halobetasol, betamethasone, hydrocortisone) can cause thinning and lightening of the skin if they are used for too long in the same area. Your physician has selected the right strength medicine for your problem and area affected on the body. Please use your medication only as directed by your physician to prevent side effects.   Due to recent changes in healthcare laws, you may see results of your pathology and/or laboratory studies on MyChart before the doctors have had a chance to review them. We understand that in some cases there may be results that are confusing or concerning to you. Please understand that not all results are received at the same time and often the doctors may need to interpret multiple results in order to provide you with the best plan of care or course of treatment. Therefore, we ask that you please give [place] 2 business days to thoroughly review all your results before contacting the office for clarification. Should we see a critical lab result, you will be contacted sooner.   If You Need Anything After Your Visit  If you  have any questions or concerns for your doctor, please call our main line at [phone] and press option 4 to reach your doctor's medical assistant. If no one answers, please leave a voicemail as directed and we will return your call as soon as possible. Messages left after 4 pm will be answered the following business day.   You may also send [place] a message via [place]. We typically respond to MyChart messages within 1-2 business days.  For prescription refills, please ask your pharmacy to contact our office. Our fax number is [phone].  If you have an urgent issue when the clinic is closed that cannot wait until the next business day, you can page your doctor at the number below.    Please note that while we do our best to be available for urgent issues outside of office hours, we are not available 24/7.   If you have an urgent issue and are unable to reach [place], you may choose to seek medical care at your doctor's office, retail clinic, urgent care center, or emergency room.  If you have a medical emergency, please immediately call 911 or go to the emergency department.  Pager Numbers  - Dr. Nehemiah Massed: [phone]  - Dr. Laurence Ferrari: [phone]  - Dr. Nicole Kindred: [phone]  In the event of inclement weather, please call our main line at [phone] for an update on the status of any delays or closures.  Dermatology Medication Tips: Please keep the boxes that topical medications come in in order to help keep track of the instructions about where and how to use  these. Pharmacies typically print the medication instructions only on the boxes and not directly on the medication tubes.   If your medication is too expensive, please contact our office at [phone] option 4 or send [place] a message through [place].   We are unable to tell what your co-pay for medications will be in advance as this is different depending on your insurance coverage. However, we may be able to find a substitute  medication at lower cost or fill out paperwork to get insurance to cover a needed medication.   If a prior authorization is required to get your medication covered by your insurance company, please allow [place] 1-2 business days to complete this process.  Drug prices often vary depending on where the prescription is filled and some pharmacies may offer cheaper prices.  The website www.goodrx.com contains coupons for medications through different pharmacies. The prices here do not account for what the cost may be with help from insurance (it may be cheaper with your insurance), but the website can give you the price if you did not use any insurance.  - You can print the associated coupon and take it with your prescription to the pharmacy.  - You may also stop by our office during regular business hours and pick up a GoodRx coupon card.  - If you need your prescription sent electronically to a different pharmacy, notify our office through [facility] or by phone at [phone] option 4.     Si Usted Necesita Algo Despus de Su Visita  Tambin puede enviarnos un mensaje a travs de Pharmacist, community. Por lo general respondemos a los mensajes de MyChart en el transcurso de 1 a 2 das hbiles.  Para renovar recetas, por favor pida a su farmacia que se ponga en contacto con nuestra oficina. Harland Dingwall de fax es [place] [phone].  Si tiene un asunto urgente cuando la clnica est cerrada y que no puede esperar hasta el siguiente da hbil, puede llamar/localizar a su doctor(a) al nmero que aparece a continuacin.   Por favor, tenga en cuenta que aunque hacemos todo lo posible para estar disponibles para asuntos urgentes fuera del horario de [place], no estamos disponibles las 24 horas del da, los 7 das de la [place].   Si tiene un problema urgente y no puede comunicarse con nosotros, puede optar por buscar atencin mdica  en el consultorio de su doctor(a), en una clnica privada, en un centro de  atencin urgente o en una sala de emergencias.  Si tiene Engineering geologist, por favor llame inmediatamente al 911 o vaya a la sala de emergencias.  Nmeros de bper  - Dr. Nehemiah Massed: [phone]  - Dra. Moye: [phone]  - Dra. Nicole Kindred: [phone]  En caso de inclemencias del [place], por favor llame a Johnsie Kindred principal al [phone] para una actualizacin sobre el [place] de cualquier retraso o cierre.  Consejos para la medicacin en dermatologa: Por favor, guarde las cajas en las que vienen los medicamentos de uso tpico para ayudarle a seguir las instrucciones sobre dnde y cmo usarlos. Las farmacias generalmente imprimen las instrucciones del medicamento slo en las cajas y no directamente en los tubos del [place].   Si su medicamento es muy caro, por favor, pngase en contacto con Zigmund Daniel llamando al [phone] y presione la opcin 4 o envenos un mensaje a travs de Pharmacist, community.   No podemos decirle cul ser su copago por los medicamentos por adelantado ya que esto es diferente dependiendo de la  cobertura de su seguro. Sin embargo, es posible que podamos encontrar un medicamento sustituto a Electrical engineer un formulario para que el seguro cubra el medicamento que se considera necesario.   Si se requiere una autorizacin previa para que su compaa de seguros [place] su medicamento, por favor permtanos de 1 a 2 das hbiles para completar este proceso.  Los precios de los medicamentos varan con frecuencia dependiendo del Environmental consultant de dnde se surte la receta y alguna farmacias pueden ofrecer precios ms baratos.  El sitio web www.goodrx.com tiene cupones para medicamentos de Airline pilot. Los precios aqu no tienen en cuenta lo que podra costar con la ayuda del seguro (puede ser ms barato con su seguro), pero el sitio web puede darle el precio si no utiliz Research scientist (physical sciences).  - Puede imprimir el cupn correspondiente y llevarlo con su receta a la  farmacia.  - Tambin puede pasar por nuestra oficina durante el horario de atencin regular y Charity fundraiser una tarjeta de cupones de GoodRx.  - Si necesita que su receta se enve electrnicamente a una farmacia diferente, informe a nuestra oficina a travs de MyChart de [place] o por telfono llamando al [phone] y presione la opcin 4.

## 2022-10-23 DIAGNOSIS — M419 Scoliosis, unspecified: Secondary | ICD-10-CM | POA: Diagnosis not present

## 2022-10-27 DIAGNOSIS — M5126 Other intervertebral disc displacement, lumbar region: Secondary | ICD-10-CM | POA: Diagnosis not present

## 2022-10-27 DIAGNOSIS — M47816 Spondylosis without myelopathy or radiculopathy, lumbar region: Secondary | ICD-10-CM | POA: Diagnosis not present

## 2022-11-01 ENCOUNTER — Encounter: Payer: Self-pay | Admitting: Internal Medicine

## 2022-11-05 ENCOUNTER — Ambulatory Visit (INDEPENDENT_AMBULATORY_CARE_PROVIDER_SITE_OTHER): Payer: 59 | Admitting: Internal Medicine

## 2022-11-05 ENCOUNTER — Encounter: Payer: Self-pay | Admitting: Internal Medicine

## 2022-11-05 VITALS — BP 132/88 | HR 72 | Ht 75.0 in | Wt 264.2 lb

## 2022-11-05 DIAGNOSIS — R339 Retention of urine, unspecified: Secondary | ICD-10-CM | POA: Insufficient documentation

## 2022-11-05 DIAGNOSIS — L309 Dermatitis, unspecified: Secondary | ICD-10-CM | POA: Diagnosis not present

## 2022-11-05 NOTE — Progress Notes (Signed)
Acute Office Visit  Subjective:    Patient ID: Elijah Shaffer, male    DOB: Feb 03, 1981, 41 y.o.   MRN: [id number]  Chief Complaint  Patient presents with   Urinary Retention    HPI Patient is in today for complaint of urinary retention, frequency, dysuria and lower abdominal pain while urinating at times.  He states that he has to stand for a few seconds before he starts urinating and has only small volume of urine outflow after it, but he has to urinate multiple times in a day.  He denies any hematuria, pelvic or flank pain.  Denies any fever or chills.  Of note, he takes Atarax as needed for itching.  Past Medical History:  Diagnosis Date   Acute renal injury ([place]) 06/28/2017   Anxiety    Aspiration pneumonia of right upper lobe due to gastric secretions ([place])    Diverticulitis    GERD (gastroesophageal reflux disease) 01/08/2018   Seasonal allergies    Syncope 06/26/2021    Past Surgical History:  Procedure Laterality Date   BIOPSY  03/18/2021   Procedure: BIOPSY;  Surgeon: Harvel Quale, MD;  [facility]: [facility];  Service: Gastroenterology;;  esophageal at [facility]   BIOPSY  05/23/2021   Procedure: BIOPSY;  Surgeon: Harvel Quale, MD;  [facility]: [facility];  Service: Gastroenterology;;   CARDIOPULMONARY EXERCISE TEST (CPX)  01/09/2022   Interpretation limited by submaximal effort.  Severe functional impairment when compared to max sedentary norms.  No cardiopulmonary limitations.  Noted tremor and generalized weakness that lead to premature exercise termination-consider neuromuscular evaluation.   COLONOSCOPY N/A 09/22/2017   Procedure: COLONOSCOPY;  Surgeon: Rogene Houston, MD;  [facility]: [facility];  Service: Endoscopy;  Laterality: N/A;  9:55   COLONOSCOPY WITH PROPOFOL N/A 05/23/2021   Procedure: COLONOSCOPY WITH PROPOFOL;  Surgeon: Harvel Quale, MD;  [facility]: [facility];  Service: Gastroenterology;  Laterality: N/A;  7:30    ESOPHAGOGASTRODUODENOSCOPY (EGD) WITH PROPOFOL N/A 03/18/2021   Procedure: ESOPHAGOGASTRODUODENOSCOPY (EGD) WITH PROPOFOL;  Surgeon: Harvel Quale, MD;  [facility];  Service: Gastroenterology;  Laterality: N/A;   ESOPHAGOGASTRODUODENOSCOPY (EGD) WITH PROPOFOL N/A 05/23/2021   Procedure: ESOPHAGOGASTRODUODENOSCOPY (EGD) WITH PROPOFOL;  Surgeon: Harvel Quale, MD;  [facility]: [facility];  Service: Gastroenterology;  Laterality: N/A;   POLYPECTOMY  09/22/2017   Procedure: POLYPECTOMY;  Surgeon: Rogene Houston, MD;  [facility];  Service: Endoscopy;;   RIGHT/LEFT HEART CATH AND CORONARY ANGIOGRAPHY N/A 12/12/2021   Procedure: RIGHT/LEFT HEART CATH AND CORONARY ANGIOGRAPHY;  Surgeon: Jolaine Artist, MD;  Location: [facility];  Service: Cardiovascular:: Normal Coronaries & Hemodynamics.  EF 50-55%. RA = 4 mmHg, RV = 23/4; PA = 22/6 (14) & PCW = 7; Ao sat = 95%; PA sat = 76%, 76%& SVC sat = 74%Fick cardiac output/index = 7.1/3.0; PVR = 1.0 WU   TRANSTHORACIC ECHOCARDIOGRAM  02/24/2021   EF 60 to 65%.  Normal LV function.  Normal valves.  Mild RV dilation with normal function.   ZIO PATCH EVENT MONITOR  01/2022   Sinus rhythm with heart rate range 24 to 159 bpm, 1  AVB and Wenckebach block noted along with rare PACs and PVCs.  No arrhythmias.    Family History  Problem Relation Age of Onset   Healthy Mother    Cancer Father    Colon cancer Maternal Grandmother    Colon cancer Maternal Grandfather  Social History   Socioeconomic History   Marital status: Single    Spouse name: Not on file   Number of children: Not on file   Years of education: Not on file   Highest education level: Not on file  Occupational History   Not on file  Tobacco Use   Smoking status: Never    Passive exposure: Past   Smokeless tobacco: Never  Vaping Use   Vaping Use: Never used  Substance and Sexual Activity   Alcohol use: Yes    Comment:  occ   Drug use: No   Sexual activity: Not on file  Other Topics Concern   Not on file  Social History Narrative   Not on file   Social Determinants of Health   Financial Resource Strain: Not on file  Food Insecurity: Not on file  Transportation Needs: Not on file  Physical Activity: Insufficiently Active (03/29/2022)   Exercise Vital Sign    Days of Exercise per Week: 2 days    Minutes of Exercise per Session: 10 min  Stress: Stress Concern Present (05/07/2022)   [place]    Feeling of Stress : To some extent  Social Connections: Not on file  Intimate Partner Violence: Not on file    Outpatient Medications Prior to Visit  Medication Sig Dispense Refill   clonazePAM (KLONOPIN) 0.5 MG tablet Take 0.5 mg by mouth at bedtime.     fluticasone-salmeterol (ADVAIR) 250-50 MCG/ACT AEPB as needed.     naproxen (NAPROSYN) 500 MG tablet Take 500 mg by mouth 2 (two) times daily.     hydrOXYzine (ATARAX) 25 MG tablet TAKE 1 TABLET BY MOUTH EVERY 8 HOURS AS NEEDED FOR ITCHING, ALLERGIES OR ANXIETY     aspirin EC 81 MG tablet Take 81 mg by mouth daily. Swallow whole.     b complex vitamins capsule Take 1 capsule by mouth daily. With D3 gummie     betamethasone dipropionate 0.05 % lotion Apply 1-2 times daily to affected itchy areas at scalp. Avoid applying to face, groin, and axilla. Use as directed. Long-term use can cause thinning of the skin. 60 mL 2   cetirizine (ZYRTEC) 10 MG tablet Take 10 mg by mouth daily.     clindamycin (CLEOCIN T) 1 % external solution Apply topically to acne bumps at aa's qd 30 mL 3   clobetasol (TEMOVATE) 0.05 % external solution Apply 1 application. topically 2 (two) times daily. Use as directed.  Mix with cerave cream. Use for up to 4 weeks. Avoid applying to face, groin, and axilla. Use as directed. 50 mL 1   clobetasol cream (TEMOVATE) 0.05 % Apply small amount daily before bed to right ankle or  other areas of body as needed for itchy rash Avoid applying to face, groin, and axilla Use as directed. 30 g 0   cyclobenzaprine (FLEXERIL) 5 MG tablet Take 1 tablet (5 mg total) by mouth at bedtime as needed for muscle spasms. 30 tablet 1   diphenhydrAMINE-zinc acetate (BENADRYL EXTRA STRENGTH) cream Apply 1 application topically 3 (three) times daily as needed for itching. 28.4 g 0   docusate sodium (COLACE) 100 MG capsule Take 100 mg by mouth 2 (two) times daily.     DULoxetine (CYMBALTA) 30 MG capsule Take 60 mg by mouth daily.     Fluocinolone Acetonide 0.01 % OIL Use 1 - 2 drop qd/bid prn for scale at ears 20 mL 2   fluocinonide (  LIDEX) 0.05 % external solution Apply once or twice daily to affected areas on scalp up to 2 weeks as needed for itching. Avoid applying to face, groin, and axilla. 60 mL 2   FLUoxetine (PROZAC) 20 MG capsule Take 1 capsule (20 mg total) by mouth daily. 90 capsule 1   fluticasone (FLONASE ALLERGY RELIEF) 50 MCG/ACT nasal spray Place 2 sprays into both nostrils daily as needed for allergies or rhinitis. (Patient taking differently: Place 2 sprays into both nostrils daily.) 9.9 mL 2   gabapentin (NEURONTIN) 300 MG capsule Take 300 mg by mouth 3 (three) times daily.     guaiFENesin (MUCINEX) 600 MG 12 hr tablet Take 600 mg by mouth 2 (two) times daily as needed for to loosen phlegm.     hydrOXYzine (VISTARIL) 25 MG capsule Take 1 capsule (25 mg total) by mouth every 8 (eight) hours as needed. Take before bed as needed for itchy. Can cause drowsiness. 60 capsule 2   ketoconazole (NIZORAL) 2 % shampoo Apply 1 Application topically daily as needed for irritation. 120 mL 11   lubiprostone (AMITIZA) 8 MCG capsule Take 1 capsule (8 mcg total) by mouth 2 (two) times daily with a meal. 180 capsule 1   montelukast (SINGULAIR) 10 MG tablet Take 1 tablet (10 mg total) by mouth at bedtime. 30 tablet 11   omega-3 acid ethyl esters (LOVAZA) 1 g capsule Take 2 capsules (2 g total) by  mouth 2 (two) times daily. 120 capsule 3   omeprazole (PRILOSEC) 40 MG capsule Take 1 capsule (40 mg total) by mouth 2 (two) times daily. 180 capsule 3   ondansetron (ZOFRAN) 4 MG tablet Take 1 tablet (4 mg total) by mouth every 6 (six) hours as needed for nausea. 20 tablet 0   Potassium 99 MG TABS Take 1 tablet by mouth daily.     propranolol (INDERAL) 20 MG tablet Take one tablet 20 mg  twice daily  may take an  additional dose of 20 mg  as needed for palpitations 190 tablet 3   senna (SENOKOT) 8.6 MG tablet Take by mouth.     tacrolimus (PROTOPIC) 0.1 % ointment APPLY TOPICALLY TO AFFECTED AREAS OF BODY FOR RASH DAILY OR TWICE DAILY AS NEEDED 30 g 0   meclizine (ANTIVERT) 12.5 MG tablet Take 12.5 mg by mouth daily as needed for dizziness.     No facility-administered medications prior to visit.    Allergies  Allergen Reactions   Gramineae Pollens Itching   Claritin [Loratadine] Other (See Comments)    Heart race   Loratadine-Pseudoephedrine Er Palpitations   Pollen Extract Other (See Comments)    Seasonal allergies    Review of Systems  Constitutional:  Positive for fatigue. Negative for chills and fever.  HENT:  Negative for congestion and sore throat.   Eyes:  Negative for pain and discharge.  Respiratory:  Positive for shortness of breath. Negative for cough.   Cardiovascular:  Positive for palpitations. Negative for chest pain.  Gastrointestinal:  Negative for diarrhea, nausea and vomiting.  Endocrine: Negative for polydipsia and polyuria.  Genitourinary:  Positive for difficulty urinating, dysuria and frequency. Negative for hematuria.  Musculoskeletal:  Positive for arthralgias, back pain, myalgias and neck pain. Negative for neck stiffness.  Neurological:  Positive for dizziness and tremors. Negative for headaches.  Psychiatric/Behavioral:  Positive for sleep disturbance. Negative for agitation and behavioral problems. The patient is nervous/anxious.        Objective:     Physical Exam  Vitals reviewed.  Constitutional:      General: He is not in acute distress.    Appearance: He is not diaphoretic.  HENT:     Head: Normocephalic and atraumatic.     Nose: Nose normal.     Mouth/Throat:     Mouth: Mucous membranes are moist.  Eyes:     General: No scleral icterus.    Extraocular Movements: Extraocular movements intact.  Cardiovascular:     Rate and Rhythm: Normal rate and regular rhythm.     Pulses: Normal pulses.     Heart sounds: Normal heart sounds. No murmur heard. Pulmonary:     Breath sounds: Normal breath sounds. No wheezing or rales.  Musculoskeletal:        General: Tenderness (Mid thoracic and lumbar spine area) present.     Cervical back: Neck supple. No tenderness.     Right lower leg: No edema.     Left lower leg: No edema.  Skin:    General: Skin is warm.     Coloration: Skin is not jaundiced.  Neurological:     General: No focal deficit present.     Mental Status: He is alert and oriented to person, place, and time.     Comments: Functional tremors on right hand  Psychiatric:        Mood and Affect: Mood is anxious and depressed. Affect is flat.        Thought Content: Thought content does not include homicidal or suicidal ideation.     BP 132/88 (BP Location: Left Arm, Patient Position: Sitting, Cuff Size: Large)   Pulse 72   Ht '6\' 3"'$  (1.905 m)   Wt 264 lb 3.2 oz (119.8 kg)   SpO2 98%   BMI 33.02 kg/m  Wt Readings from Last 3 Encounters:  11/05/22 264 lb 3.2 oz (119.8 kg)  07/29/22 263 lb (119.3 kg)  06/10/22 261 lb 12.8 oz (118.8 kg)        Assessment & Plan:   Problem List Items Addressed This Visit       Musculoskeletal and Integument   Eczema    Has steroid creams, followed by dermatology Has Atarax as needed for itching, may need to switch agent as he is having urinary retention        Genitourinary   Urinary retention - Primary    With urinary frequency Check UA with reflex culture to rule out  UTI If persistent, may need to avoid anticholinergic agents or switch agent Will consider postvoid bladder scan if persistent symptoms      Relevant Orders   UA/M w/rflx Culture, Routine     No orders of the defined types were placed in this encounter.    Lindell Spar, MD

## 2022-11-05 NOTE — Patient Instructions (Addendum)
Please continue to take medications as prescribed.  Please maintain at least 64 ounces of fluid intake in a day.

## 2022-11-05 NOTE — Assessment & Plan Note (Addendum)
With urinary frequency Check UA with reflex culture to rule out UTI If persistent, may need to avoid anticholinergic agents or switch agent Will consider postvoid bladder scan if persistent symptoms

## 2022-11-05 NOTE — Addendum Note (Signed)
Addended by: Everett Graff D on: 11/05/2022 09:36 AM   Modules accepted: Orders

## 2022-11-05 NOTE — Assessment & Plan Note (Signed)
Has steroid creams, followed by dermatology Has Atarax as needed for itching, may need to switch agent as he is having urinary retention

## 2022-11-06 LAB — MICROSCOPIC EXAMINATION
Bacteria, UA: NONE SEEN
Casts: NONE SEEN /lpf
Epithelial Cells (non renal): NONE SEEN /hpf (ref 0–10)
RBC, Urine: NONE SEEN /hpf (ref 0–2)
WBC, UA: NONE SEEN /hpf (ref 0–5)

## 2022-11-06 LAB — UA/M W/RFLX CULTURE, ROUTINE
Bilirubin, UA: NEGATIVE
Glucose, UA: NEGATIVE
Ketones, UA: NEGATIVE
Leukocytes,UA: NEGATIVE
Nitrite, UA: NEGATIVE
RBC, UA: NEGATIVE
Specific Gravity, UA: 1.017 (ref 1.005–1.030)
Urobilinogen, Ur: 0.2 mg/dL (ref 0.2–1.0)
pH, UA: 6 (ref 5.0–7.5)

## 2022-11-18 DIAGNOSIS — S134XXA Sprain of ligaments of cervical spine, initial encounter: Secondary | ICD-10-CM | POA: Diagnosis not present

## 2022-11-18 DIAGNOSIS — M546 Pain in thoracic spine: Secondary | ICD-10-CM | POA: Diagnosis not present

## 2022-11-18 DIAGNOSIS — M47814 Spondylosis without myelopathy or radiculopathy, thoracic region: Secondary | ICD-10-CM | POA: Diagnosis not present

## 2022-11-18 DIAGNOSIS — S338XXA Sprain of other parts of lumbar spine and pelvis, initial encounter: Secondary | ICD-10-CM | POA: Diagnosis not present

## 2022-11-22 ENCOUNTER — Telehealth: Payer: Self-pay | Admitting: Internal Medicine

## 2022-11-22 NOTE — Telephone Encounter (Signed)
Patient called said he can to to work and still gets tired and has to sit down and rest like an hour before getting up and go again.   He also said he laid on couch and got up to take a shower could hardly move his legs and had more back pain and taking baby steps.  Working makes him worse and more pain at night time.

## 2022-11-25 ENCOUNTER — Ambulatory Visit (INDEPENDENT_AMBULATORY_CARE_PROVIDER_SITE_OTHER): Payer: 59 | Admitting: Gastroenterology

## 2022-11-25 DIAGNOSIS — S338XXA Sprain of other parts of lumbar spine and pelvis, initial encounter: Secondary | ICD-10-CM | POA: Diagnosis not present

## 2022-11-25 DIAGNOSIS — M546 Pain in thoracic spine: Secondary | ICD-10-CM | POA: Diagnosis not present

## 2022-11-25 DIAGNOSIS — S134XXA Sprain of ligaments of cervical spine, initial encounter: Secondary | ICD-10-CM | POA: Diagnosis not present

## 2022-11-25 DIAGNOSIS — M47814 Spondylosis without myelopathy or radiculopathy, thoracic region: Secondary | ICD-10-CM | POA: Diagnosis not present

## 2022-11-25 NOTE — Telephone Encounter (Signed)
Patient advised.

## 2022-11-25 NOTE — Telephone Encounter (Signed)
Lmtrc

## 2022-12-02 ENCOUNTER — Encounter: Payer: Self-pay | Admitting: Internal Medicine

## 2022-12-02 ENCOUNTER — Ambulatory Visit (INDEPENDENT_AMBULATORY_CARE_PROVIDER_SITE_OTHER): Payer: 59 | Admitting: Internal Medicine

## 2022-12-02 VITALS — BP 114/82 | HR 85 | Ht 75.0 in | Wt 264.4 lb

## 2022-12-02 DIAGNOSIS — F419 Anxiety disorder, unspecified: Secondary | ICD-10-CM | POA: Diagnosis not present

## 2022-12-02 DIAGNOSIS — M5136 Other intervertebral disc degeneration, lumbar region: Secondary | ICD-10-CM | POA: Diagnosis not present

## 2022-12-02 DIAGNOSIS — E559 Vitamin D deficiency, unspecified: Secondary | ICD-10-CM

## 2022-12-02 DIAGNOSIS — R739 Hyperglycemia, unspecified: Secondary | ICD-10-CM

## 2022-12-02 DIAGNOSIS — R69 Illness, unspecified: Secondary | ICD-10-CM | POA: Diagnosis not present

## 2022-12-02 DIAGNOSIS — G4733 Obstructive sleep apnea (adult) (pediatric): Secondary | ICD-10-CM | POA: Diagnosis not present

## 2022-12-02 DIAGNOSIS — E782 Mixed hyperlipidemia: Secondary | ICD-10-CM | POA: Diagnosis not present

## 2022-12-02 DIAGNOSIS — Z0001 Encounter for general adult medical examination with abnormal findings: Secondary | ICD-10-CM

## 2022-12-02 DIAGNOSIS — R0609 Other forms of dyspnea: Secondary | ICD-10-CM | POA: Diagnosis not present

## 2022-12-02 NOTE — Patient Instructions (Signed)
Please continue taking medications as prescribed.  Please follow up with physical therapy as scheduled.  Please continue to use CPAP for OSA.

## 2022-12-02 NOTE — Progress Notes (Signed)
Established Patient Office Visit  Subjective:  Patient ID: Elijah Shaffer, male    DOB: 08-12-1981  Age: 42 y.o. MRN: [id number]  CC:  Chief Complaint  Patient presents with   Annual Exam    Patient said he is still having back pain, which makes him difficult to sleep at night. He has noticed his body is getting tired and shortness of breath more often    HPI Elijah Shaffer is a 42 y.o. male with past medical history of GERD, eczema, anxiety and chronic dyspnea who presents for annual physical.  He still complains of neck, upper back and lower back pain, worse with mild exertion and sideways movement.  He denies any heavy lifting recently.  Denies any recent injury.  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  He has been referred to physical therapy for it.  He is on gabapentin and Cymbalta currently.  Denies any saddle anesthesia, urinary or stool incontinence.   He also reports chronic fatigue and dyspnea on exertion.  He has had extensive pulmonology and cardiac work-up, including PFTs, echo, left and right heart cath, which have not been able to show reason behind his dyspnea.  He also has been seen by rheumatology and neurology for chronic fatigue and weakness of the extremities.   He has a history of GERD and Barrett's esophagus, for which he takes Protonix.   He has been taking Prozac for anxiety.  He has improvement in his spells of anxiety with Prozac now.  Denies any SI or HI currently.      Past Medical History:  Diagnosis Date   Acute renal injury ([place]) 06/28/2017   Anxiety    Aspiration pneumonia of right upper lobe due to gastric secretions ([place])    Diverticulitis    GERD (gastroesophageal reflux disease) 01/08/2018   Seasonal allergies    Syncope 06/26/2021    Past Surgical History:  Procedure Laterality Date   BIOPSY  03/18/2021   Procedure: BIOPSY;  Surgeon: Harvel Quale, MD;  [facility]: [facility];   Service: Gastroenterology;;  esophageal at [facility]   BIOPSY  05/23/2021   Procedure: BIOPSY;  Surgeon: Harvel Quale, MD;  Location: [facility];  Service: Gastroenterology;;   CARDIOPULMONARY EXERCISE TEST (CPX)  01/09/2022   Interpretation limited by submaximal effort.  Severe functional impairment when compared to max sedentary norms.  No cardiopulmonary limitations.  Noted tremor and generalized weakness that lead to premature exercise termination-consider neuromuscular evaluation.   COLONOSCOPY N/A 09/22/2017   Procedure: COLONOSCOPY;  Surgeon: Rogene Houston, MD;  Location: [facility];  Service: Endoscopy;  Laterality: N/A;  9:55   COLONOSCOPY WITH PROPOFOL N/A 05/23/2021   Procedure: COLONOSCOPY WITH PROPOFOL;  Surgeon: Harvel Quale, MD;  [facility]: [facility];  Service: Gastroenterology;  Laterality: N/A;  7:30   ESOPHAGOGASTRODUODENOSCOPY (EGD) WITH PROPOFOL N/A 03/18/2021   Procedure: ESOPHAGOGASTRODUODENOSCOPY (EGD) WITH PROPOFOL;  Surgeon: Harvel Quale, MD;  [facility]: [facility];  Service: Gastroenterology;  Laterality: N/A;   ESOPHAGOGASTRODUODENOSCOPY (EGD) WITH PROPOFOL N/A 05/23/2021   Procedure: ESOPHAGOGASTRODUODENOSCOPY (EGD) WITH PROPOFOL;  Surgeon: Harvel Quale, MD;  [facility]: [facility];  Service: Gastroenterology;  Laterality: N/A;   POLYPECTOMY  09/22/2017   Procedure: POLYPECTOMY;  Surgeon: Rogene Houston, MD;  [facility];  Service: Endoscopy;;   RIGHT/LEFT HEART CATH AND CORONARY ANGIOGRAPHY N/A 12/12/2021   Procedure: RIGHT/LEFT HEART CATH AND CORONARY ANGIOGRAPHY;  Surgeon: Jolaine Artist, MD;  Location: [facility];  Service: Cardiovascular:: Normal Coronaries & Hemodynamics.  EF 50-55%. RA = 4 mmHg, RV = 23/4; PA = 22/6 (14) & PCW = 7; Ao sat = 95%; PA sat = 76%, 76%& SVC sat = 74%Fick cardiac output/index = 7.1/3.0; PVR = 1.0 WU   TRANSTHORACIC ECHOCARDIOGRAM  02/24/2021   EF  60 to 65%.  Normal LV function.  Normal valves.  Mild RV dilation with normal function.   ZIO PATCH EVENT MONITOR  01/2022   Sinus rhythm with heart rate range 24 to 159 bpm, 1  AVB and Wenckebach block noted along with rare PACs and PVCs.  No arrhythmias.    Family History  Problem Relation Age of Onset   Healthy Mother    Cancer Father    Colon cancer Maternal Grandmother    Colon cancer Maternal Grandfather     Social History   Socioeconomic History   Marital status: Single    Spouse name: Not on file   Number of children: Not on file   Years of education: Not on file   Highest education level: Not on file  Occupational History   Not on file  Tobacco Use   Smoking status: Never    Passive exposure: Past   Smokeless tobacco: Never  Vaping Use   Vaping Use: Never used  Substance and Sexual Activity   Alcohol use: Yes    Comment: occ   Drug use: No   Sexual activity: Not on file  Other Topics Concern   Not on file  Social History Narrative   Not on file   Social Determinants of Health   Financial Resource Strain: Not on file  Food Insecurity: Not on file  Transportation Needs: Not on file  Physical Activity: Insufficiently Active (03/29/2022)   Exercise Vital Sign    Days of Exercise per Week: 2 days    Minutes of Exercise per Session: 10 min  Stress: Stress Concern Present (05/07/2022)   [place]    Feeling of Stress : To some extent  Social Connections: Not on file  Intimate Partner Violence: Not on file    Outpatient Medications Prior to Visit  Medication Sig Dispense Refill   aspirin EC 81 MG tablet Take 81 mg by mouth daily. Swallow whole.     b complex vitamins capsule Take 1 capsule by mouth daily. With D3 gummie     betamethasone dipropionate 0.05 % lotion Apply 1-2 times daily to affected itchy areas at scalp. Avoid applying to face, groin, and axilla. Use as directed. Long-term use  can cause thinning of the skin. 60 mL 2   cetirizine (ZYRTEC) 10 MG tablet Take 10 mg by mouth daily.     clindamycin (CLEOCIN T) 1 % external solution Apply topically to acne bumps at aa's qd 30 mL 3   clobetasol (TEMOVATE) 0.05 % external solution Apply 1 application. topically 2 (two) times daily. Use as directed.  Mix with cerave cream. Use for up to 4 weeks. Avoid applying to face, groin, and axilla. Use as directed. 50 mL 1   clobetasol cream (TEMOVATE) 0.05 % Apply small amount daily before bed to right ankle or other areas of body as needed for itchy rash Avoid applying to face, groin, and axilla Use as directed. 30 g 0   cyclobenzaprine (FLEXERIL) 5 MG tablet Take 1 tablet (5 mg total) by mouth at bedtime as  needed for muscle spasms. 30 tablet 1   diphenhydrAMINE-zinc acetate (BENADRYL EXTRA STRENGTH) cream Apply 1 application topically 3 (three) times daily as needed for itching. 28.4 g 0   docusate sodium (COLACE) 100 MG capsule Take 100 mg by mouth 2 (two) times daily.     DULoxetine (CYMBALTA) 30 MG capsule Take 60 mg by mouth daily.     Fluocinolone Acetonide 0.01 % OIL Use 1 - 2 drop qd/bid prn for scale at ears 20 mL 2   fluocinonide (LIDEX) 0.05 % external solution Apply once or twice daily to affected areas on scalp up to 2 weeks as needed for itching. Avoid applying to face, groin, and axilla. 60 mL 2   FLUoxetine (PROZAC) 20 MG capsule Take 1 capsule (20 mg total) by mouth daily. 90 capsule 1   fluticasone (FLONASE ALLERGY RELIEF) 50 MCG/ACT nasal spray Place 2 sprays into both nostrils daily as needed for allergies or rhinitis. (Patient taking differently: Place 2 sprays into both nostrils daily.) 9.9 mL 2   fluticasone-salmeterol (ADVAIR) 250-50 MCG/ACT AEPB as needed.     gabapentin (NEURONTIN) 300 MG capsule Take 300 mg by mouth 3 (three) times daily.     hydrOXYzine (VISTARIL) 25 MG capsule Take 1 capsule (25 mg total) by mouth every 8 (eight) hours as needed. Take before bed  as needed for itchy. Can cause drowsiness. 60 capsule 2   ketoconazole (NIZORAL) 2 % shampoo Apply 1 Application topically daily as needed for irritation. 120 mL 11   lubiprostone (AMITIZA) 8 MCG capsule Take 1 capsule (8 mcg total) by mouth 2 (two) times daily with a meal. 180 capsule 1   montelukast (SINGULAIR) 10 MG tablet Take 1 tablet (10 mg total) by mouth at bedtime. 30 tablet 11   naproxen (NAPROSYN) 500 MG tablet Take 500 mg by mouth 2 (two) times daily.     omega-3 acid ethyl esters (LOVAZA) 1 g capsule Take 2 capsules (2 g total) by mouth 2 (two) times daily. 120 capsule 3   omeprazole (PRILOSEC) 40 MG capsule Take 1 capsule (40 mg total) by mouth 2 (two) times daily. 180 capsule 3   ondansetron (ZOFRAN) 4 MG tablet Take 1 tablet (4 mg total) by mouth every 6 (six) hours as needed for nausea. 20 tablet 0   Potassium 99 MG TABS Take 1 tablet by mouth daily.     propranolol (INDERAL) 20 MG tablet Take one tablet 20 mg  twice daily  may take an  additional dose of 20 mg  as needed for palpitations 190 tablet 3   senna (SENOKOT) 8.6 MG tablet Take by mouth.     tacrolimus (PROTOPIC) 0.1 % ointment APPLY TOPICALLY TO AFFECTED AREAS OF BODY FOR RASH DAILY OR TWICE DAILY AS NEEDED 30 g 0   clonazePAM (KLONOPIN) 0.5 MG tablet Take 0.5 mg by mouth at bedtime.     guaiFENesin (MUCINEX) 600 MG 12 hr tablet Take 600 mg by mouth 2 (two) times daily as needed for to loosen phlegm.     No facility-administered medications prior to visit.    Allergies  Allergen Reactions   Gramineae Pollens Itching   Claritin [Loratadine] Other (See Comments)    Heart race   Loratadine-Pseudoephedrine Er Palpitations   Pollen Extract Other (See Comments)    Seasonal allergies    ROS Review of Systems  Constitutional:  Positive for fatigue. Negative for chills and fever.  HENT:  Negative for congestion and sore throat.   Eyes:  Negative for pain and discharge.  Respiratory:  Positive for shortness of  breath. Negative for cough.   Cardiovascular:  Positive for palpitations. Negative for chest pain.  Gastrointestinal:  Negative for diarrhea, nausea and vomiting.  Endocrine: Negative for polydipsia and polyuria.  Genitourinary:  Negative for dysuria and hematuria.  Musculoskeletal:  Positive for arthralgias, back pain, myalgias and neck pain. Negative for neck stiffness.  Neurological:  Positive for dizziness and tremors. Negative for headaches.  Psychiatric/Behavioral:  Positive for sleep disturbance. Negative for agitation and behavioral problems. The patient is nervous/anxious.       Objective:    Physical Exam Vitals reviewed.  Constitutional:      General: He is not in acute distress.    Appearance: He is not diaphoretic.  HENT:     Head: Normocephalic and atraumatic.     Nose: Nose normal.     Mouth/Throat:     Mouth: Mucous membranes are moist.  Eyes:     General: No scleral icterus.    Extraocular Movements: Extraocular movements intact.  Cardiovascular:     Rate and Rhythm: Normal rate and regular rhythm.     Pulses: Normal pulses.     Heart sounds: Normal heart sounds. No murmur heard. Pulmonary:     Breath sounds: Normal breath sounds. No wheezing or rales.  Abdominal:     Palpations: Abdomen is soft.     Tenderness: There is no abdominal tenderness.  Musculoskeletal:        General: Tenderness (Mid thoracic and lumbar spine area) present.     Cervical back: Neck supple. No tenderness.     Right lower leg: No edema.     Left lower leg: No edema.  Skin:    General: Skin is warm.     Coloration: Skin is not jaundiced.  Neurological:     General: No focal deficit present.     Mental Status: He is alert and oriented to person, place, and time.     Cranial Nerves: No cranial nerve deficit.     Sensory: No sensory deficit.     Motor: No weakness.     Comments: Functional tremors on right hand  Psychiatric:        Mood and Affect: Mood is anxious and depressed.  Affect is flat.        Thought Content: Thought content does not include homicidal or suicidal ideation.     BP 114/82 (BP Location: Left Arm, Patient Position: Sitting, Cuff Size: Large)   Pulse 85   Ht '6\' 3"'$  (1.905 m)   Wt 264 lb 6.4 oz (119.9 kg)   SpO2 97%   BMI 33.05 kg/m  Wt Readings from Last 3 Encounters:  12/02/22 264 lb 6.4 oz (119.9 kg)  11/05/22 264 lb 3.2 oz (119.8 kg)  07/29/22 263 lb (119.3 kg)    Lab Results  Component Value Date   TSH 3.110 12/02/2022   Lab Results  Component Value Date   WBC 9.2 12/02/2022   HGB 18.1 (H) 12/02/2022   HCT 52.5 (H) 12/02/2022   MCV 88 12/02/2022   PLT 298 12/02/2022   Lab Results  Component Value Date   NA 139 12/02/2022   K 5.3 (H) 12/02/2022   CO2 21 12/02/2022   GLUCOSE 108 (H) 12/02/2022   BUN 16 12/02/2022   CREATININE 1.75 (H) 12/02/2022   BILITOT 0.6 12/02/2022   ALKPHOS 76 12/02/2022   AST 34 12/02/2022   ALT 49 (H) 12/02/2022  PROT 7.4 12/02/2022   ALBUMIN 4.6 12/02/2022   CALCIUM 10.4 (H) 12/02/2022   ANIONGAP 10 12/27/2021   EGFR 50 (L) 12/02/2022   GFR 68.05 10/02/2021   Lab Results  Component Value Date   CHOL 266 (H) 12/02/2022   Lab Results  Component Value Date   HDL 31 (L) 12/02/2022   Lab Results  Component Value Date   LDLCALC Comment (A) 12/02/2022   Lab Results  Component Value Date   TRIG 987 (HH) 12/02/2022   Lab Results  Component Value Date   CHOLHDL 8.6 (H) 12/02/2022   Lab Results  Component Value Date   HGBA1C 6.0 (H) 12/02/2022      Assessment & Plan:   Problem List Items Addressed This Visit       Respiratory   OSA (obstructive sleep apnea)    Uses CPAP regularly      Relevant Orders   CBC with Differential/Platelet (Completed)     Musculoskeletal and Integument   DDD (degenerative disc disease), lumbar    Has had lumbar spine x-ray, which showed DDD and bone spurring - note received from chiropractor Has had spinal manipulation done by  chiropractor MRI lumbar spine showed DDD of lumbar spine Planned to start PT Advised to avoid heavy lifting and frequent bending Flexeril as needed for muscle spasms Naproxen as needed for back pain Continue gabapentin for neuropathic pain Referred to spine surgery        Other   Dyspnea on minimal exertion (Chronic)    Fatigue and dyspnea on exertion - unclear etiology Had decreased DLCO on PFT with mild RV dilation Cardiac cath unremarkable Followed by Pulmonology      Relevant Orders   CBC with Differential/Platelet (Completed)   Mixed hyperlipidemia (Chronic)    Advised to follow low cholesterol diet for now If persistently elevated cholesterol profile, plan to refer to lipid clinic      Relevant Orders   Lipid panel (Completed)   Anxiety    Well-controlled now Had increased dose of Prozac, 20 mg QD now Hydroxyzine as needed for anxiety      Relevant Orders   TSH (Completed)   CMP14+EGFR (Completed)   CBC with Differential/Platelet (Completed)   Encounter for general adult medical examination with abnormal findings - Primary    Physical exam as documented. Fasting blood tests ordered.      Relevant Orders   VITAMIN D 25 Hydroxy (Vit-D Deficiency, Fractures) (Completed)   TSH (Completed)   Lipid panel (Completed)   Hemoglobin A1c (Completed)   CMP14+EGFR (Completed)   CBC with Differential/Platelet (Completed)   Other Visit Diagnoses     Hyperglycemia       Relevant Orders   Hemoglobin A1c (Completed)   Vitamin D deficiency       Relevant Orders   VITAMIN D 25 Hydroxy (Vit-D Deficiency, Fractures) (Completed)       No orders of the defined types were placed in this encounter.   Follow-up: Return in about 4 months (around 04/02/2023) for GAD.    Lindell Spar, MD

## 2022-12-03 ENCOUNTER — Other Ambulatory Visit: Payer: Self-pay | Admitting: Internal Medicine

## 2022-12-03 DIAGNOSIS — D751 Secondary polycythemia: Secondary | ICD-10-CM | POA: Insufficient documentation

## 2022-12-03 DIAGNOSIS — E782 Mixed hyperlipidemia: Secondary | ICD-10-CM

## 2022-12-03 LAB — HEMOGLOBIN A1C
Est. average glucose Bld gHb Est-mCnc: 126 mg/dL
Hgb A1c MFr Bld: 6 % — ABNORMAL HIGH (ref 4.8–5.6)

## 2022-12-03 LAB — CMP14+EGFR
ALT: 49 IU/L — ABNORMAL HIGH (ref 0–44)
AST: 34 IU/L (ref 0–40)
Albumin/Globulin Ratio: 1.6 (ref 1.2–2.2)
Albumin: 4.6 g/dL (ref 4.1–5.1)
Alkaline Phosphatase: 76 IU/L (ref 44–121)
BUN/Creatinine Ratio: 9 (ref 9–20)
BUN: 16 mg/dL (ref 6–24)
Bilirubin Total: 0.6 mg/dL (ref 0.0–1.2)
CO2: 21 mmol/L (ref 20–29)
Calcium: 10.4 mg/dL — ABNORMAL HIGH (ref 8.7–10.2)
Chloride: 99 mmol/L (ref 96–106)
Creatinine, Ser: 1.75 mg/dL — ABNORMAL HIGH (ref 0.76–1.27)
Globulin, Total: 2.8 g/dL (ref 1.5–4.5)
Glucose: 108 mg/dL — ABNORMAL HIGH (ref 70–99)
Potassium: 5.3 mmol/L — ABNORMAL HIGH (ref 3.5–5.2)
Sodium: 139 mmol/L (ref 134–144)
Total Protein: 7.4 g/dL (ref 6.0–8.5)
eGFR: 50 mL/min/{1.73_m2} — ABNORMAL LOW (ref >59)

## 2022-12-03 LAB — CBC WITH DIFFERENTIAL/PLATELET
Basophils Absolute: 0.1 10*3/uL (ref 0.0–0.2)
Basos: 1 %
EOS (ABSOLUTE): 0.1 10*3/uL (ref 0.0–0.4)
Eos: 1 %
Hematocrit: 52.5 % — ABNORMAL HIGH (ref 37.5–51.0)
Hemoglobin: 18.1 g/dL — ABNORMAL HIGH (ref 13.0–17.7)
Immature Grans (Abs): 0.1 10*3/uL (ref 0.0–0.1)
Immature Granulocytes: 1 %
Lymphocytes Absolute: 2.7 10*3/uL (ref 0.7–3.1)
Lymphs: 29 %
MCH: 30.2 pg (ref 26.6–33.0)
MCHC: 34.5 g/dL (ref 31.5–35.7)
MCV: 88 fL (ref 79–97)
Monocytes Absolute: 0.7 10*3/uL (ref 0.1–0.9)
Monocytes: 7 %
Neutrophils Absolute: 5.6 10*3/uL (ref 1.4–7.0)
Neutrophils: 61 %
Platelets: 298 10*3/uL (ref 150–450)
RBC: 6 x10E6/uL — ABNORMAL HIGH (ref 4.14–5.80)
RDW: 12.1 % (ref 11.6–15.4)
WBC: 9.2 10*3/uL (ref 3.4–10.8)

## 2022-12-03 LAB — LIPID PANEL
Chol/HDL Ratio: 8.6 ratio — ABNORMAL HIGH (ref 0.0–5.0)
Cholesterol, Total: 266 mg/dL — ABNORMAL HIGH (ref 100–199)
HDL: 31 mg/dL — ABNORMAL LOW (ref >39)
Triglycerides: 987 mg/dL (ref 0–149)

## 2022-12-03 LAB — VITAMIN D 25 HYDROXY (VIT D DEFICIENCY, FRACTURES): Vit D, 25-Hydroxy: 12.6 ng/mL — ABNORMAL LOW (ref 30.0–100.0)

## 2022-12-03 LAB — TSH: TSH: 3.11 u[IU]/mL (ref 0.450–4.500)

## 2022-12-03 NOTE — Assessment & Plan Note (Signed)
Well-controlled now Had increased dose of Prozac, 20 mg QD now Hydroxyzine as needed for anxiety 

## 2022-12-03 NOTE — Assessment & Plan Note (Signed)
Advised to follow low cholesterol diet for now If persistently elevated cholesterol profile, plan to refer to lipid clinic

## 2022-12-03 NOTE — Assessment & Plan Note (Signed)
Physical exam as documented. Fasting blood tests ordered. 

## 2022-12-03 NOTE — Assessment & Plan Note (Signed)
Uses CPAP regularly °

## 2022-12-03 NOTE — Assessment & Plan Note (Signed)
Has had lumbar spine x-ray, which showed DDD and bone spurring - note received from chiropractor Has had spinal manipulation done by chiropractor MRI lumbar spine showed DDD of lumbar spine Planned to start PT Advised to avoid heavy lifting and frequent bending Flexeril as needed for muscle spasms Naproxen as needed for back pain Continue gabapentin for neuropathic pain Referred to spine surgery

## 2022-12-03 NOTE — Assessment & Plan Note (Signed)
Fatigue and dyspnea on exertion - unclear etiology Had decreased DLCO on PFT with mild RV dilation Cardiac cath unremarkable Followed by Pulmonology

## 2022-12-04 ENCOUNTER — Other Ambulatory Visit: Payer: Self-pay | Admitting: Pulmonary Disease

## 2022-12-04 DIAGNOSIS — J4530 Mild persistent asthma, uncomplicated: Secondary | ICD-10-CM

## 2022-12-10 NOTE — Therapy (Signed)
OUTPATIENT PHYSICAL THERAPY LUMBAR AND THORACIC SPINE EVALUATION   Patient Name: Elijah Shaffer MRN: [id number] DOB:Apr 29, 1981, 42 y.o., male Today's Date: 12/11/2022  END OF SESSION:  PT End of Session - 12/11/22 0823     Visit Number 1    Number of Visits 8    Date for PT Re-Evaluation 01/08/23    Authorization Type [employer]; no VL and no auth    Progress Note Due on Visit 8    PT Start Time 0820    PT Stop Time 0900    PT Time Calculation (min) 40 min    Activity Tolerance Patient tolerated treatment well    Behavior During Therapy [facility] for tasks assessed/performed             Past Medical History:  Diagnosis Date   Acute renal injury ([place]) 06/28/2017   Anxiety    Aspiration pneumonia of right upper lobe due to gastric secretions ([place])    Diverticulitis    GERD (gastroesophageal reflux disease) 01/08/2018   Seasonal allergies    Syncope 06/26/2021   Past Surgical History:  Procedure Laterality Date   BIOPSY  03/18/2021   Procedure: BIOPSY;  Surgeon: Harvel Quale, MD;  [facility];  Service: Gastroenterology;;  esophageal at Z-line   BIOPSY  05/23/2021   Procedure: BIOPSY;  Surgeon: Harvel Quale, MD;  [facility];  Service: Gastroenterology;;   CARDIOPULMONARY EXERCISE TEST (CPX)  01/09/2022   Interpretation limited by submaximal effort.  Severe functional impairment when compared to max sedentary norms.  No cardiopulmonary limitations.  Noted tremor and generalized weakness that lead to premature exercise termination-consider neuromuscular evaluation.   COLONOSCOPY N/A 09/22/2017   Procedure: COLONOSCOPY;  Surgeon: Rogene Houston, MD;  [facility];  Service: Endoscopy;  Laterality: N/A;  9:55   COLONOSCOPY WITH PROPOFOL N/A 05/23/2021   Procedure: COLONOSCOPY WITH PROPOFOL;  Surgeon: Harvel Quale, MD;  [facility];  Service: Gastroenterology;  Laterality: N/A;  7:30    ESOPHAGOGASTRODUODENOSCOPY (EGD) WITH PROPOFOL N/A 03/18/2021   Procedure: ESOPHAGOGASTRODUODENOSCOPY (EGD) WITH PROPOFOL;  Surgeon: Harvel Quale, MD;  [facility];  Service: Gastroenterology;  Laterality: N/A;   ESOPHAGOGASTRODUODENOSCOPY (EGD) WITH PROPOFOL N/A 05/23/2021   Procedure: ESOPHAGOGASTRODUODENOSCOPY (EGD) WITH PROPOFOL;  Surgeon: Harvel Quale, MD;  [facility];  Service: Gastroenterology;  Laterality: N/A;   POLYPECTOMY  09/22/2017   Procedure: POLYPECTOMY;  Surgeon: Rogene Houston, MD;  [facility];  Service: Endoscopy;;   RIGHT/LEFT HEART CATH AND CORONARY ANGIOGRAPHY N/A 12/12/2021   Procedure: RIGHT/LEFT HEART CATH AND CORONARY ANGIOGRAPHY;  Surgeon: Jolaine Artist, MD;  Location: [facility];  Service: Cardiovascular:: Normal Coronaries & Hemodynamics.  EF 50-55%. RA = 4 mmHg, RV = 23/4; PA = 22/6 (14) & PCW = 7; Ao sat = 95%; PA sat = 76%, 76%& SVC sat = 74%Fick cardiac output/index = 7.1/3.0; PVR = 1.0 WU   TRANSTHORACIC ECHOCARDIOGRAM  02/24/2021   EF 60 to 65%.  Normal LV function.  Normal valves.  Mild RV dilation with normal function.   ZIO PATCH EVENT MONITOR  01/2022   Sinus rhythm with heart rate range 24 to 159 bpm, 1  AVB and Wenckebach block noted along with rare PACs and PVCs.  No arrhythmias.   Patient Active Problem List   Diagnosis Date Noted   Erythrocytosis 12/03/2022   Encounter for general adult medical examination with abnormal findings  12/02/2022   OSA (obstructive sleep apnea) 12/02/2022   Urinary retention 11/05/2022   Cervical spinal stenosis 07/29/2022   DDD (degenerative disc disease), lumbar 07/29/2022   NASH (nonalcoholic steatohepatitis) 06/10/2022   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/2022   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   PSVT (paroxysmal supraventricular tachycardia) 03/25/2022   Itching 03/07/2022   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    Bloating 03/07/2022   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   [facility] spotted fever 12/31/2021   Encounter for examination following treatment at hospital 12/31/2021   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   Chronic sinusitis 12/04/2021   Mixed hyperlipidemia 11/30/2021   Esophageal hiatal hernia 11/22/2021   Irritable bowel syndrome with constipation 11/22/2021   Dyspnea on minimal exertion 06/28/2021   Anxiety 06/26/2021   Eczema 06/26/2021   Barrett's esophagus 04/11/2021   History of colonic polyps 04/11/2021   Focal neurological deficit 02/23/2021   GERD (gastroesophageal reflux disease) 01/08/2018    PCP: Ihor Dow, MD  REFERRING PROVIDER: Judith Part, MD  REFERRING DIAG: M41.9 (ICD-10-CM) - Scoliosis, unspecified  THERAPY DIAG:  Low back pain, unspecified back pain laterality, unspecified chronicity, unspecified whether sciatica present - Plan: PT plan of care cert/re-cert  Pain in thoracic spine - Plan: PT plan of care cert/re-cert  Rationale for Evaluation and Treatment: Rehabilitation  ONSET DATE: chronic; worse over the last year  SUBJECTIVE:   SUBJECTIVE STATEMENT: Back pain has gotten worse over the last year; 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  PC[facility] to [facility].  MRI done of the thoracic spine; referred to therapy; he also goes to chiropractor some; Dr. Bethann Goo.  Reports weak feeling in legs  PERTINENT HISTORY: Last year [facility] spotted fever with subsequent  tremors and passing out Large hiatal hernia. See above PAIN:  Are you having pain? Yes: NPRS scale: 5/10 Pain location: back from neck down to hip Pain description: sore and aching, stiff Aggravating factors: working, overworking Relieving factors: medication  PRECAUTIONS: None  WEIGHT BEARING RESTRICTIONS: No  FALLS:  Has patient fallen in last 6 months? No  LIVING ENVIRONMENT: Lives with: lives with their family Lives in: House/apartment Stairs: Yes:  External: 6 steps; on right going up, on left going up, and can reach both Has following equipment at home: None  OCCUPATION: Tree surgeon maintenance  PLOF: Independent  PATIENT GOALS: help my back feel better  NEXT MD VISIT: Dr. Jabier Mutton (neurologist)  OBJECTIVE:   DIAGNOSTIC FINDINGS: IMPRESSION: 1. Mild degenerative changes of the thoracic spine without high-grade spinal canal or neural foraminal stenosis at any level. 2. Exaggerated thoracic kyphosis related to minimal chronic compression fractures, as detailed above. 3. Large hiatal hernia.  IMPRESSION: 1. Moderate right foraminal encroachment due to spurring and disc protrusion. Slight cord flattening on the right due to spurring. 2. Otherwise no significant spinal stenosis or cord compression.     Electronically Signed   By: Pedro Earls M.D.   On: 09/06/2022 14:22  PATIENT SURVEYS:  Modified Oswestry 15/50 30%   COGNITION: Overall cognitive status: Within functional limits for tasks assessed     SENSATION: Reports occasional numbness right thigh   POSTURE: rounded shoulders, forward head, and increased thoracic kyphosis slight right side rib hump; significant kyphosis thoracic spine  PALPATION: General tenderness of the back on palpation  LOWER EXTREMITY MMT:  MMT Right eval Left eval  Hip flexion 4+* 5  Hip extension 4 4+  Hip abduction    Hip  adduction    Hip internal rotation    Hip external rotation    Knee flexion 5 4+  Knee extension 5* 5  Ankle dorsiflexion 5 5  Ankle plantarflexion    Ankle inversion    Ankle eversion     (Blank rows = not tested)   LUMBAR ROM:   Active  A/PROM  eval  Flexion 85% available *  Extension 50% available*  Right lateral flexion To knee  Left lateral flexion To just above knee  Right rotation   Left rotation    (Blank rows = not tested)   FUNCTIONAL TESTS:  5 times sit to stand: 18.80 Sec   TODAY'S TREATMENT:                                                                                                                               DATE: 12/11/22 physical therapy evaluation and HEP instruction    PATIENT EDUCATION:  Education details: Patient educated on exam findings, POC, scope of PT, HEP, and what . Person educated: Patient Education method: Explanation, Demonstration, and Handouts Education comprehension: verbalized understanding, returned demonstration, verbal cues required, and tactile cues required HOME EXERCISE PROGRAM: Access Code: [email or web address] URL: [email or web address] Date: 12/11/2022 Prepared by: [facility]  Exercises - Static Prone on Elbows  - 4 x daily - 7 x weekly - 1 sets - 1 reps - 2 minutes hold - Prone Press Up  - 4 x daily - 7 x weekly - 1 sets - 10 reps - Seated Thoracic Lumbar Extension  - 4 x daily - 7 x weekly - 1 sets - 10 reps - Doorway Pec Stretch at 90 Degrees Abduction  - 2 x daily - 7 x weekly - 1 sets - 5 reps - 20 sec hold  ASSESSMENT:  CLINICAL IMPRESSION: Patient is a 42 y.o. male who was seen today for physical therapy evaluation and treatment for M41.9 (ICD-10-CM) - Scoliosis, unspecified. Patient presents to physical therapy with complaint of back pain. Patient demonstrates muscle weakness, reduced ROM, and fascial restrictions which are likely contributing to symptoms of pain and are negatively impacting patient ability to perform ADLs and functional mobility tasks. Patient will benefit from skilled physical therapy services to address these deficits to reduce pain and improve level of function with ADLs and functional mobility tasks.   OBJECTIVE IMPAIRMENTS: Abnormal gait, decreased activity tolerance, decreased knowledge of condition, decreased mobility, difficulty walking, decreased ROM, decreased strength, hypomobility, increased fascial restrictions, impaired perceived functional ability, impaired flexibility, postural dysfunction, and pain.    ACTIVITY LIMITATIONS: carrying, lifting, bending, standing, squatting, sleeping, reach over head, locomotion level, and caring for others  PARTICIPATION LIMITATIONS: meal prep, cleaning, laundry, shopping, community activity, occupation, and yard work  PERSONAL FACTORS: Profession are also affecting patient's functional outcome.   REHAB POTENTIAL: Good  CLINICAL DECISION MAKING: Stable/uncomplicated  EVALUATION COMPLEXITY: Low   GOALS: Goals reviewed with patient? No  SHORT TERM GOALS: Target  date: 12/25/2022 patient will be independent with initial HEP  Baseline: Goal status: INITIAL  2.  Patient will self report 30% improvement to improve tolerance for functional activity   Baseline:  Goal status: INITIAL   LONG TERM GOALS: Target date: 01/08/2023  Patient will be independent in self management strategies to improve quality of life and functional outcomes.   Baseline:  Goal status: INITIAL  2.  Patient will improve modified Oswestry score by 10 points to demonstrate improved functional mobility Baseline: 15/50 Goal status: INITIAL  3.  Patient will increase bilateral leg MMTs to 5/5 without pain to promote return to ambulation community distances with minimal deviation.  Baseline: see above Goal status: INITIAL  4.  Patient will self report 50% improvement to improve tolerance for functional activity  Baseline:  Goal status: INITIAL   PLAN:  PT FREQUENCY: 2x/week  PT DURATION: 4 weeks  PLANNED INTERVENTIONS: Therapeutic exercises, Therapeutic activity, Neuromuscular re-education, Balance training, Gait training, Patient/Family education, Joint manipulation, Joint mobilization, Stair training, Orthotic/Fit training, DME instructions, Aquatic Therapy, Dry Needling, Electrical stimulation, Spinal manipulation, Spinal mobilization, Cryotherapy, Moist heat, Compression bandaging, scar mobilization, Splintting, Taping, Traction, Ultrasound, Ionotophoresis  '4mg'$ /ml Dexamethasone, and Manual therapy   PLAN FOR NEXT SESSION: Review HEP and goals; progress lumbar and thoracic extension mobility as able; postural and core strengthening; lower extremity strengthening; body mechanics training   9:21 AM, 12/11/22 Aksh Swart Small Lakshya Mcgillicuddy MPT [facility] [phone] BT:[phone]

## 2022-12-11 ENCOUNTER — Ambulatory Visit (HOSPITAL_COMMUNITY): Payer: 59 | Attending: Neurological Surgery

## 2022-12-11 ENCOUNTER — Other Ambulatory Visit: Payer: Self-pay

## 2022-12-11 DIAGNOSIS — M546 Pain in thoracic spine: Secondary | ICD-10-CM | POA: Diagnosis not present

## 2022-12-11 DIAGNOSIS — M545 Low back pain, unspecified: Secondary | ICD-10-CM | POA: Diagnosis not present

## 2022-12-16 ENCOUNTER — Ambulatory Visit: Payer: 59 | Admitting: Dermatology

## 2022-12-16 VITALS — BP 115/91 | HR 74

## 2022-12-16 DIAGNOSIS — L2089 Other atopic dermatitis: Secondary | ICD-10-CM

## 2022-12-16 MED ORDER — DUPIXENT 300 MG/2ML ~~LOC~~ SOSY: 600.0000 mg | PREFILLED_SYRINGE | Freq: Once | SUBCUTANEOUS | 0 refills | Status: AC

## 2022-12-16 MED ORDER — DUPIXENT 300 MG/2ML ~~LOC~~ SOAJ: 300.0000 mg | SUBCUTANEOUS | 5 refills | Status: DC

## 2022-12-16 MED ORDER — DUPIXENT 300 MG/2ML ~~LOC~~ SOSY: 600.0000 mg | PREFILLED_SYRINGE | Freq: Once | SUBCUTANEOUS | 0 refills | Status: DC

## 2022-12-16 NOTE — Progress Notes (Signed)
Follow-Up Visit   Subjective  Elijah Shaffer is a 42 y.o. male who presents for the following: Follow-up.  Patient presents for 2 month follow-up dermatitis of the scalp. He has a few bumps in his scalp that he is concerned about. He uses betamethasone dipropionate lotion, ketoconazole 2% shampoo, and fluocinolone oil, as needed. He is still itching. He has eczema on the face and body, and uses clobetasol cream, clobetasol/CeraVe cream mix, tacrolimus ointment, and hydroxyzine, as needed. He has new bumps on his trunk and is still itchy all over. He has seasonal allergies.  The following portions of the chart were reviewed this encounter and updated as appropriate:       Review of Systems:  No other skin or systemic complaints except as noted in HPI or Assessment and Plan.  Objective  Well appearing patient in no apparent distress; mood and affect are within normal limits.  A focused examination was performed including face, trunk, arms. Relevant physical exam findings are noted in the Assessment and Plan.  trunk, hands, fingers, face, scalp Excoriations on the abdomen/flank with xerosis; erythema and lichenification on the palms and fingers; crusted excoriated papules on the scalp, mild erythema face BSA 12%    Assessment & Plan  Other atopic dermatitis trunk, hands, fingers, face, scalp  Chronic and persistent condition with duration or expected duration over one year. Condition is symptomatic / bothersome to patient. Not to goal. Patient is still itchy all over.  Atopic dermatitis (eczema) is a chronic, relapsing, pruritic condition that can significantly affect quality of life. It is often associated with allergic rhinitis and/or asthma and can require treatment with topical medications, phototherapy, or in severe cases biologic injectable medication (Dupixent; Adbry) or Oral JAK inhibitors.  Discussed Dupixent injections. Patient would like to start. Loading dose given to  patient in office today. Dupixent 300 mg/mL x 2 given to left and right upper arm today. Patient tolerated well.  Lot [id number] Exp 02/2025 NDC 4166-0630-16 Maintenance Rx sent to [facility] '300mg'$ /mL injected q 2 weeks 5Rf.  Dupilumab (Dupixent) is a treatment given by injection for adults and children with moderate-to-severe atopic dermatitis. Goal is control of skin condition, not cure. It is given as 2 injections at the first dose followed by 1 injection ever 2 weeks thereafter.  Young children are dosed monthly.  Potential side effects include allergic reaction, herpes infections, injection site reactions and conjunctivitis (inflammation of the eyes).  The use of Dupixent requires long term medication management, including periodic office visits.   Recommend mild soap and moisturizing cream 1-2 times daily.  Gentle skin care handout provided.    Continue clobetasol 0.05% cream to itchy spots at legs 1-2 times daily for up to 2 weeks as needed for itchy flares. Avoid applying to face, groin, and axilla. Use as directed. Long-term use can cause thinning of the skin. Continue Clob/CeraVe cream mix to aas body qd/bid prn itch Continue tacrolimus ointment 1-2 times daily. Continue hydroxyzine 25 mg at bedtime as needed for itch. May cause drowsiness.  Continue betamethasone dipropionate 0.05% lotion 1-2 times daily to affected areas at scalp as needed for itch.  Continue ketoconazole 2% shampoo apply three times per week, massage into scalp and leave in for 5-10 minutes before rinsing out. Continue fluocinolone oil 1-2 drops daily to ears as needed for itch.  Topical steroids (such as triamcinolone, fluocinolone, fluocinonide, mometasone, clobetasol, halobetasol, betamethasone, hydrocortisone) can cause thinning and lightening of the skin if they are used  for too long in the same area. Your physician has selected the right strength medicine for your problem and area affected on the body.  Please use your medication only as directed by your physician to prevent side effects.      Related Medications clobetasol (TEMOVATE) 0.05 % external solution Apply 1 application. topically 2 (two) times daily. Use as directed.  Mix with cerave cream. Use for up to 4 weeks. Avoid applying to face, groin, and axilla. Use as directed.  hydrOXYzine (VISTARIL) 25 MG capsule Take 1 capsule (25 mg total) by mouth every 8 (eight) hours as needed. Take before bed as needed for itchy. Can cause drowsiness.  Dupilumab (DUPIXENT) 300 MG/2ML SOPN Inject 300 mg into the skin every 14 (fourteen) days. Starting at day 15 for maintenance.  dupilumab (DUPIXENT) 300 MG/2ML prefilled syringe Inject 600 mg into the skin once for 1 dose. On day 1.   Return in about 2 weeks (around 12/30/2022) for nurse visit for Dupixent injection (sample), 1 month Dr Geralyn Flash, CMA, am acting as scribe for [name], MD .  Documentation: I have reviewed the above documentation for accuracy and completeness, and I agree with the above.  [name] MD

## 2022-12-16 NOTE — Patient Instructions (Addendum)
Dupilumab (Dupixent) is a treatment given by injection for adults and children with moderate-to-severe atopic dermatitis. Goal is control of skin condition, not cure. It is given as 2 injections at the first dose followed by 1 injection ever 2 weeks thereafter.  Young children are dosed monthly.  Potential side effects include allergic reaction, herpes infections, injection site reactions and conjunctivitis (inflammation of the eyes).  The use of Dupixent requires long term medication management, including periodic office visits.  Atopic Dermatitis  "Dermatitis" means inflammation of the skin.  "Atopic" dermatitis is a particular type of skin inflammation that is marked by dryness, associated itching, and a characteristic pattern of rash on the body.  The condition is fairly common and may occur in as many as 10% of children.  You will often hear it called "atopic eczema" or sometimes just "eczema".  The exact cause of atopic dermatitis is unknown.  In many patients, there is a family history of hay fever, asthma, or atopic dermatitis itself.  Rarely, atopic dermatitis in infants may be related to food sensitivity, such as sensitivity to milk, but this is often difficult to determine and manage.  In the majority of cases, however, no allergic triggers can be found.  Physical or emotional stressors (severe seasonal allergies, physical illness, etc.) can worsen atopic dermatitis.  Atopic dermatitis usually starts in infancy from the ages of 2 to 6 months.  The skin is dry and the rash is quite itchy, so infants may be restless and rub against the sheets or scratch (if able).  The rash may involve the face or it may cover a large part of the body.  As the child gets older, the rash may become more localized.  In early childhood, the rash is commonly on the legs, feet, hands and arms.  As a child becomes older, the rash may be limited to the bend of the elbows, knees, on the back of the hands, feet, and on the  neck and face.  When the rash becomes more established, the dry itchy skin may become thickened, leathery and sometimes darker in coloration.  The more the person scratches, the worse the rash is and the thicker the skin gets.  Many children with atopic dermatitis outgrow the condition before school age, while others continue to have problems into adolescence and adulthood.  Many things may affect the severity of the condition.  All patients have sensitive and dry skin.  Many will find that during the winter months when the humidity is very low, the dryness and itchiness will be worse.  On the other hand, some people are easily irritated by sweat and will find that they have more problems during the summer months.  Most patients note an increase in itching at times when there are sudden changes in temperature.  Other irritants easily affect the skin of a patient with atopic dermatitis.  Use of harsh soaps or detergents and exposure to wool are common problems.  Sometimes atopic dermatitis may become infected by bacteria, yeast or viruses.  This is called "secondary infection".  Bacterial secondary infection is the most common and is often a result of scratching.  The rash gets very red with pus-filled pimples and scabs.  If this occurs, your doctor will prescribe an antibiotic to control the infection.  A more serious complication can be caused by certain viruses.  The "cold sore" virus (herpes simplex) may cause a severe rash.  If this is suspected, immediately contact your doctor.  What can I expect from treatment? Unfortunately, there is no "magic" cure that will always eliminate atopic dermatitis.  The main objective in treating atopic dermatitis is to decrease the skin eruption and relieve the itching.  There are a number of different forms of the medications that are used for atopic dermatitis.  Primarily, topical medications will be used.  Because the skin is excessively dry, moisturizers will be  recommended that will effectively decrease the dryness.  Daily bathing is a useful way to get water into the skin but bathing should be brief (no more than 10 minutes unless otherwise indicated by your physician).  Effective moisturizers (Cetaphil cream or lotion, CeraVe cream or lotion [[employer], [employer], and [employer]], Aquaphor, and plain Vaseline) can be used immediately after the bath or shower to trap moisture within the skin.  It is best to "pat dry" after a bathing and then place your moisturizer (cream or lotion) on your skin.  Cortisone (steroid) is a medicated ointment or cream (eg. triamcinolone, hydrocortisone, desonide, betamethasone, clobetasol) that may also be suggested.  It is very helpful in decreasing the itching and controlling the inflammation.  Your doctor will prescribe a cortisone treatment that is most appropriate for the severity and location of the dermatitis that is to be treated.   Once the affected area clears up, it is best to discontinue the use of the cortisone preparation due to possibility of atrophy (skin thinning), but continue the regular use of moisturizers to try to prevent new areas of dermatitis from occurring.  Of course, if itching or a new rash begins, the cortisone preparation may have to be started again.  Anti-inflammatory creams and ointments which are not steroids such as Protopic and Elidel may also be prescribed.  Certain internal medicines called antihistamines (eg. Atarax, Benadryl, hydroxyzine) may help control itching.  They primarily help with the itching by introducing some drowsiness and allowing you to sleep at night.  Some oral antibiotics are often useful as well for controlling the secondary infection and enable infected dermatitis to be controlled.  Other important forms of treatment: Avoid contact with substances you know to cause itching.  These may include soaps, detergents, certain perfumes, dust, grass, weeds, wools, and other types of  scratchy clothing. You may bathe daily.  Use no soap or the minimal amount necessary to get clean.  Always use moisturizer immediately after bathing (within 3 minutes is best).  Avoid very hot or very cold water.  Avoid bubble baths.  When drying with a towel, pat dry and do not rub. Use a mild, unscented soap (Dove, CeraVe Cleanser, Lever 2000, or Cetaphil). Try to keep the temperature and humidity in the home fairly constant.  Use a bedroom air conditioner in the summer and a humidifier in the winter.  It is very important that the humidifier be cleaned frequently and thoroughly since mold may grow and cause allergies. Try to avoid scratching.  Atopic dermatitis is often called "the itch that rashes" and it is known that scratching plays a significant role in making atopic dermatitis worse.  Keeping the nails short and well-filed is helpful. Use a fragrance-free, sensitive skin laundry detergent (eg. All Free & Clear).  Run clothes through a second rinse cycle to remove any residual detergents and chemicals.  Bed linens and towels should be washed in hot water to kill dust mites, which are common allergen in atopic patients. In the bedroom, minimize rugs and curtains or other loose fabrics that collect dust.  The National Eczema Association (www.eczema-assn.org) is a wonderful organization that sends out a Mudlogger with useful information on these types of conditions. Please consider contacting them at the above website or by address: National Eczema Association for Science and Education, [place], [place], [place], [place]     Due to recent changes in healthcare laws, you may see results of your pathology and/or laboratory studies on MyChart before the doctors have had a chance to review them. We understand that in some cases there may be results that are confusing or concerning to you. Please understand that not all results are received at the same time and often the  doctors may need to interpret multiple results in order to provide you with the best plan of care or course of treatment. Therefore, we ask that you please give [place] 2 business days to thoroughly review all your results before contacting the office for clarification. Should we see a critical lab result, you will be contacted sooner.   If You Need Anything After Your Visit  If you have any questions or concerns for your doctor, please call our main line at [phone] and press option 4 to reach your doctor's medical assistant. If no one answers, please leave a voicemail as directed and we will return your call as soon as possible. Messages left after 4 pm will be answered the following business day.   You may also send [place] a message via [place]. We typically respond to MyChart messages within 1-2 business days.  For prescription refills, please ask your pharmacy to contact our office. Our fax number is [phone].  If you have an urgent issue when the clinic is closed that cannot wait until the next business day, you can page your doctor at the number below.    Please note that while we do our best to be available for urgent issues outside of office hours, we are not available 24/7.   If you have an urgent issue and are unable to reach [place], you may choose to seek medical care at your doctor's office, retail clinic, urgent care center, or emergency room.  If you have a medical emergency, please immediately call 911 or go to the emergency department.  Pager Numbers  - Dr. Nehemiah Massed: [phone]  - Dr. Laurence Ferrari: [phone]  - Dr. Nicole Kindred: [phone]  In the event of inclement weather, please call our main line at [phone] for an update on the status of any delays or closures.  Dermatology Medication Tips: Please keep the boxes that topical medications come in in order to help keep track of the instructions about where and how to use these. Pharmacies typically print the medication  instructions only on the boxes and not directly on the medication tubes.   If your medication is too expensive, please contact our office at [phone] option 4 or send [place] a message through [place].   We are unable to tell what your co-pay for medications will be in advance as this is different depending on your insurance coverage. However, we may be able to find a substitute medication at lower cost or fill out paperwork to get insurance to cover a needed medication.   If a prior authorization is required to get your medication covered by your insurance company, please allow [place] 1-2 business days to complete this process.  Drug prices often vary depending on where the prescription is filled and some pharmacies may offer cheaper prices.  The website www.goodrx.com contains coupons for  medications through different pharmacies. The prices here do not account for what the cost may be with help from insurance (it may be cheaper with your insurance), but the website can give you the price if you did not use any insurance.  - You can print the associated coupon and take it with your prescription to the pharmacy.  - You may also stop by our office during regular business hours and pick up a GoodRx coupon card.  - If you need your prescription sent electronically to a different pharmacy, notify our office through [facility] or by phone at [phone] option 4.     Si Usted Necesita Algo Despus de Su Visita  Tambin puede enviarnos un mensaje a travs de Pharmacist, community. Por lo general respondemos a los mensajes de MyChart en el transcurso de 1 a 2 das hbiles.  Para renovar recetas, por favor pida a su farmacia que se ponga en contacto con nuestra oficina. Harland Dingwall de fax es [place] [phone].  Si tiene un asunto urgente cuando la clnica est cerrada y que no puede esperar hasta el siguiente da hbil, puede llamar/localizar a su doctor(a) al nmero que aparece a continuacin.   Por  favor, tenga en cuenta que aunque hacemos todo lo posible para estar disponibles para asuntos urgentes fuera del horario de [place], no estamos disponibles las 24 horas del da, los 7 das de la [place].   Si tiene un problema urgente y no puede comunicarse con nosotros, puede optar por buscar atencin mdica  en el consultorio de su doctor(a), en una clnica privada, en un centro de atencin urgente o en una sala de emergencias.  Si tiene Engineering geologist, por favor llame inmediatamente al 911 o vaya a la sala de emergencias.  Nmeros de bper  - Dr. Nehemiah Massed: [phone]  - Dra. Moye: [phone]  - Dra. Nicole Kindred: [phone]  En caso de inclemencias del [place], por favor llame a Johnsie Kindred principal al [phone] para una actualizacin sobre el [place] de cualquier retraso o cierre.  Consejos para la medicacin en dermatologa: Por favor, guarde las cajas en las que vienen los medicamentos de uso tpico para ayudarle a seguir las instrucciones sobre dnde y cmo usarlos. Las farmacias generalmente imprimen las instrucciones del medicamento slo en las cajas y no directamente en los tubos del [place].   Si su medicamento es muy caro, por favor, pngase en contacto con Zigmund Daniel llamando al [phone] y presione la opcin 4 o envenos un mensaje a travs de Pharmacist, community.   No podemos decirle cul ser su copago por los medicamentos por adelantado ya que esto es diferente dependiendo de la cobertura de su seguro. Sin embargo, es posible que podamos encontrar un medicamento sustituto a Electrical engineer un formulario para que el seguro cubra el medicamento que se considera necesario.   Si se requiere una autorizacin previa para que su compaa de seguros [place] su medicamento, por favor permtanos de 1 a 2 das hbiles para completar este proceso.  Los precios de los medicamentos varan con frecuencia dependiendo del Environmental consultant de dnde se surte la receta y alguna farmacias  pueden ofrecer precios ms baratos.  El sitio web www.goodrx.com tiene cupones para medicamentos de Airline pilot. Los precios aqu no tienen en cuenta lo que podra costar con la ayuda del seguro (puede ser ms barato con su seguro), pero el sitio web puede darle el precio si no utiliz Research scientist (physical sciences).  - Puede imprimir el cupn correspondiente y  llevarlo con su receta a la farmacia.  - Tambin puede pasar por nuestra oficina durante el horario de atencin regular y Charity fundraiser una tarjeta de cupones de GoodRx.  - Si necesita que su receta se enve electrnicamente a una farmacia diferente, informe a nuestra oficina a travs de MyChart de [place] o por telfono llamando al [phone] y presione la opcin 4.

## 2022-12-17 ENCOUNTER — Ambulatory Visit (HOSPITAL_COMMUNITY): Payer: 59

## 2022-12-17 DIAGNOSIS — M546 Pain in thoracic spine: Secondary | ICD-10-CM

## 2022-12-17 DIAGNOSIS — D751 Secondary polycythemia: Secondary | ICD-10-CM | POA: Diagnosis present

## 2022-12-17 DIAGNOSIS — M545 Low back pain, unspecified: Secondary | ICD-10-CM

## 2022-12-17 DIAGNOSIS — Z800 Family history of malignant neoplasm of digestive organs: Secondary | ICD-10-CM | POA: Diagnosis not present

## 2022-12-17 NOTE — Therapy (Signed)
OUTPATIENT PHYSICAL THERAPY LUMBAR AND THORACIC SPINE EVALUATION   Patient Name: Elijah Shaffer MRN: [id number] DOB:02-06-1981, 42 y.o., male Today's Date: 12/17/2022  END OF SESSION:  PT End of Session - 12/17/22 1351     Visit Number 2    Number of Visits 8    Date for PT Re-Evaluation 01/08/23    Authorization Type [employer]; no VL and no auth    Progress Note Due on Visit 8    PT Start Time 1350    PT Stop Time 1430    PT Time Calculation (min) 40 min    Activity Tolerance Patient tolerated treatment well    Behavior During Therapy [facility] for tasks assessed/performed             Past Medical History:  Diagnosis Date   Acute renal injury ([place]) 06/28/2017   Anxiety    Aspiration pneumonia of right upper lobe due to gastric secretions ([place])    Diverticulitis    GERD (gastroesophageal reflux disease) 01/08/2018   Seasonal allergies    Syncope 06/26/2021   Past Surgical History:  Procedure Laterality Date   BIOPSY  03/18/2021   Procedure: BIOPSY;  Surgeon: Harvel Quale, MD;  [facility];  Service: Gastroenterology;;  esophageal at Z-line   BIOPSY  05/23/2021   Procedure: BIOPSY;  Surgeon: Harvel Quale, MD;  [facility];  Service: Gastroenterology;;   CARDIOPULMONARY EXERCISE TEST (CPX)  01/09/2022   Interpretation limited by submaximal effort.  Severe functional impairment when compared to max sedentary norms.  No cardiopulmonary limitations.  Noted tremor and generalized weakness that lead to premature exercise termination-consider neuromuscular evaluation.   COLONOSCOPY N/A 09/22/2017   Procedure: COLONOSCOPY;  Surgeon: Rogene Houston, MD;  [facility];  Service: Endoscopy;  Laterality: N/A;  9:55   COLONOSCOPY WITH PROPOFOL N/A 05/23/2021   Procedure: COLONOSCOPY WITH PROPOFOL;  Surgeon: Harvel Quale, MD;  [facility];  Service: Gastroenterology;  Laterality: N/A;  7:30    ESOPHAGOGASTRODUODENOSCOPY (EGD) WITH PROPOFOL N/A 03/18/2021   Procedure: ESOPHAGOGASTRODUODENOSCOPY (EGD) WITH PROPOFOL;  Surgeon: Harvel Quale, MD;  [facility];  Service: Gastroenterology;  Laterality: N/A;   ESOPHAGOGASTRODUODENOSCOPY (EGD) WITH PROPOFOL N/A 05/23/2021   Procedure: ESOPHAGOGASTRODUODENOSCOPY (EGD) WITH PROPOFOL;  Surgeon: Harvel Quale, MD;  [facility];  Service: Gastroenterology;  Laterality: N/A;   POLYPECTOMY  09/22/2017   Procedure: POLYPECTOMY;  Surgeon: Rogene Houston, MD;  [facility];  Service: Endoscopy;;   RIGHT/LEFT HEART CATH AND CORONARY ANGIOGRAPHY N/A 12/12/2021   Procedure: RIGHT/LEFT HEART CATH AND CORONARY ANGIOGRAPHY;  Surgeon: Jolaine Artist, MD;  Location: [facility];  Service: Cardiovascular:: Normal Coronaries & Hemodynamics.  EF 50-55%. RA = 4 mmHg, RV = 23/4; PA = 22/6 (14) & PCW = 7; Ao sat = 95%; PA sat = 76%, 76%& SVC sat = 74%Fick cardiac output/index = 7.1/3.0; PVR = 1.0 WU   TRANSTHORACIC ECHOCARDIOGRAM  02/24/2021   EF 60 to 65%.  Normal LV function.  Normal valves.  Mild RV dilation with normal function.   ZIO PATCH EVENT MONITOR  01/2022   Sinus rhythm with heart rate range 24 to 159 bpm, 1  AVB and Wenckebach block noted along with rare PACs and PVCs.  No arrhythmias.   Patient Active Problem List   Diagnosis Date Noted   Erythrocytosis 12/03/2022   Encounter for general adult medical examination with abnormal findings  12/02/2022   OSA (obstructive sleep apnea) 12/02/2022   Urinary retention 11/05/2022   Cervical spinal stenosis 07/29/2022   DDD (degenerative disc disease), lumbar 07/29/2022   NASH (nonalcoholic steatohepatitis) 06/10/2022   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/2022   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   PSVT (paroxysmal supraventricular tachycardia) 03/25/2022   Itching 03/07/2022   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    Bloating 03/07/2022   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   [facility] spotted fever 12/31/2021   Encounter for examination following treatment at hospital 12/31/2021   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   Chronic sinusitis 12/04/2021   Mixed hyperlipidemia 11/30/2021   Esophageal hiatal hernia 11/22/2021   Irritable bowel syndrome with constipation 11/22/2021   Dyspnea on minimal exertion 06/28/2021   Anxiety 06/26/2021   Eczema 06/26/2021   Barrett's esophagus 04/11/2021   History of colonic polyps 04/11/2021   Focal neurological deficit 02/23/2021   GERD (gastroesophageal reflux disease) 01/08/2018    PCP: Ihor Dow, MD  REFERRING PROVIDER: Judith Part, MD  REFERRING DIAG: M41.9 (ICD-10-CM) - Scoliosis, unspecified  THERAPY DIAG:  Low back pain, unspecified back pain laterality, unspecified chronicity, unspecified whether sciatica present  Pain in thoracic spine  Rationale for Evaluation and Treatment: Rehabilitation  ONSET DATE: chronic; worse over the last year  SUBJECTIVE:   SUBJECTIVE STATEMENT: Has been compliant with HEP; Did some work on Saturday; started having some tremors Sunday night while trying to sleep.  Made an appointment to see neurologist again due to increased tremors.  Has been active since last visit moving a bed; landscaping etc.  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  PCP referred to [facility].  MRI done of the thoracic spine; referred to therapy; he also goes to chiropractor some; Dr. Bethann Goo.  Reports weak feeling in legs  PERTINENT HISTORY: Last year [facility] spotted fever with subsequent  tremors and passing out Large hiatal hernia. See above PAIN:  Are you having pain? Yes: NPRS scale: 5/10 Pain location: back from neck down to hip Pain description: sore and aching, stiff Aggravating factors: working, overworking Relieving factors: medication  PRECAUTIONS:  None  WEIGHT BEARING RESTRICTIONS: No  FALLS:  Has patient fallen in last 6 months? No  LIVING ENVIRONMENT: Lives with: lives with their family Lives in: House/apartment Stairs: Yes: External: 6 steps; on right going up, on left going up, and can reach both Has following equipment at home: None  OCCUPATION: Tree surgeon maintenance  PLOF: Independent  PATIENT GOALS: help my back feel better  NEXT MD VISIT: Dr. Jabier Mutton (neurologist)  OBJECTIVE:   DIAGNOSTIC FINDINGS: IMPRESSION: 1. Mild degenerative changes of the thoracic spine without high-grade spinal canal or neural foraminal stenosis at any level. 2. Exaggerated thoracic kyphosis related to minimal chronic compression fractures, as detailed above. 3. Large hiatal hernia.  IMPRESSION: 1. Moderate right foraminal encroachment due to spurring and disc protrusion. Slight cord flattening on the right due to spurring. 2. Otherwise no significant spinal stenosis or cord compression.     Electronically Signed   By: Pedro Earls M.D.   On: 09/06/2022 14:22  PATIENT SURVEYS:  Modified Oswestry 15/50 30%   COGNITION: Overall cognitive status: Within functional limits for tasks assessed     SENSATION: Reports occasional numbness right thigh   POSTURE: rounded shoulders, forward head, and increased thoracic kyphosis slight right side rib hump; significant kyphosis thoracic spine  PALPATION: General tenderness of  the back on palpation  LOWER EXTREMITY MMT:  MMT Right eval Left eval  Hip flexion 4+* 5  Hip extension 4 4+  Hip abduction    Hip adduction    Hip internal rotation    Hip external rotation    Knee flexion 5 4+  Knee extension 5* 5  Ankle dorsiflexion 5 5  Ankle plantarflexion    Ankle inversion    Ankle eversion     (Blank rows = not tested)   LUMBAR ROM:   Active  A/PROM  eval  Flexion 85% available *  Extension 50% available*  Right lateral flexion To knee   Left lateral flexion To just above knee  Right rotation   Left rotation    (Blank rows = not tested)   FUNCTIONAL TESTS:  5 times sit to stand: 18.80 Sec   TODAY'S TREATMENT:                                                                                                                              DATE:  12/17/2022 Review of HEP and goals Decompression exercises 5" hold x 8 each Head press Shoulder press Leg lengthener Leg press  12/11/22 physical therapy evaluation and HEP instruction    PATIENT EDUCATION:  Education details: Patient educated on exam findings, POC, scope of PT, HEP, and what . Person educated: Patient Education method: Explanation, Demonstration, and Handouts Education comprehension: verbalized understanding, returned demonstration, verbal cues required, and tactile cues required HOME EXERCISE PROGRAM: 12/17/2022 decompression exercises; hold all other HEP for now  Access Code: [email or web address] URL: [email or web address] Date: 12/11/2022 Prepared by: [facility]  Exercises - Static Prone on Elbows  - 4 x daily - 7 x weekly - 1 sets - 1 reps - 2 minutes hold - Prone Press Up  - 4 x daily - 7 x weekly - 1 sets - 10 reps - Seated Thoracic Lumbar Extension  - 4 x daily - 7 x weekly - 1 sets - 10 reps - Doorway Pec Stretch at 90 Degrees Abduction  - 2 x daily - 7 x weekly - 1 sets - 5 reps - 20 sec hold  ASSESSMENT:  CLINICAL IMPRESSION: Today's session started with a review of HEP and goals; patient verbalizes agreement with set rehab goals.  Patient did his HEP but reports some increased tremors so we proceeded with trying decompression exercises today.  Patient with good tolerance for decompression exercises but needed cues for technique and breathing.  Issued paper handout of decompression exercise.  No pain after treatment today.  Patient will benefit from continued skilled therapy services to address deficits and promote return to optimal function.        Eval:Patient is a 42 y.o. male who was seen today for physical therapy evaluation and treatment for M41.9 (ICD-10-CM) - Scoliosis, unspecified. Patient presents to physical therapy with complaint of back pain. Patient demonstrates muscle weakness, reduced ROM, and fascial restrictions which  are likely contributing to symptoms of pain and are negatively impacting patient ability to perform ADLs and functional mobility tasks. Patient will benefit from skilled physical therapy services to address these deficits to reduce pain and improve level of function with ADLs and functional mobility tasks.   OBJECTIVE IMPAIRMENTS: Abnormal gait, decreased activity tolerance, decreased knowledge of condition, decreased mobility, difficulty walking, decreased ROM, decreased strength, hypomobility, increased fascial restrictions, impaired perceived functional ability, impaired flexibility, postural dysfunction, and pain.   ACTIVITY LIMITATIONS: carrying, lifting, bending, standing, squatting, sleeping, reach over head, locomotion level, and caring for others  PARTICIPATION LIMITATIONS: meal prep, cleaning, laundry, shopping, community activity, occupation, and yard work  PERSONAL FACTORS: Profession are also affecting patient's functional outcome.   REHAB POTENTIAL: Good  CLINICAL DECISION MAKING: Stable/uncomplicated  EVALUATION COMPLEXITY: Low   GOALS: Goals reviewed with patient? No  SHORT TERM GOALS: Target date: 12/25/2022 patient will be independent with initial HEP  Baseline: Goal status: IN PROGRESS  2.  Patient will self report 30% improvement to improve tolerance for functional activity   Baseline:  Goal status: IN PROGRESS   LONG TERM GOALS: Target date: 01/08/2023  Patient will be independent in self management strategies to improve quality of life and functional outcomes.   Baseline:  Goal status: IN PROGRESS  2.  Patient will improve modified Oswestry score by 10  points to demonstrate improved functional mobility Baseline: 15/50 Goal status: IN PROGRESS  3.  Patient will increase bilateral leg MMTs to 5/5 without pain to promote return to ambulation community distances with minimal deviation.  Baseline: see above Goal status: IN PROGRESS  4.  Patient will self report 50% improvement to improve tolerance for functional activity  Baseline:  Goal status: IN PROGRESS   PLAN:  PT FREQUENCY: 2x/week  PT DURATION: 4 weeks  PLANNED INTERVENTIONS: Therapeutic exercises, Therapeutic activity, Neuromuscular re-education, Balance training, Gait training, Patient/Family education, Joint manipulation, Joint mobilization, Stair training, Orthotic/Fit training, DME instructions, Aquatic Therapy, Dry Needling, Electrical stimulation, Spinal manipulation, Spinal mobilization, Cryotherapy, Moist heat, Compression bandaging, scar mobilization, Splintting, Taping, Traction, Ultrasound, Ionotophoresis '4mg'$ /ml Dexamethasone, and Manual therapy   PLAN FOR NEXT SESSION:  progress lumbar and thoracic extension mobility as able; postural and core strengthening; lower extremity strengthening; body mechanics training; decompression exercises   2:34 PM, 12/17/22 Donae Kueker Small Biance Moncrief MPT [place] [facility]  [phone] [phone]

## 2022-12-20 ENCOUNTER — Ambulatory Visit (HOSPITAL_COMMUNITY): Payer: 59

## 2022-12-20 DIAGNOSIS — D751 Secondary polycythemia: Secondary | ICD-10-CM | POA: Diagnosis not present

## 2022-12-20 DIAGNOSIS — M545 Low back pain, unspecified: Secondary | ICD-10-CM

## 2022-12-20 NOTE — Therapy (Addendum)
OUTPATIENT PHYSICAL THERAPY LUMBAR AND THORACIC TREATMENT   Patient Name: Elijah Shaffer MRN: [id number] DOB:08-05-1981, 42 y.o., male Today's Date: 12/20/2022  END OF SESSION:  PT End of Session - 12/20/22 1415     Visit Number 3    Number of Visits 8    Date for PT Re-Evaluation 01/08/23    Authorization Type [employer]; no VL and no auth    Progress Note Due on Visit 8    PT Start Time 1345    PT Stop Time 1430    PT Time Calculation (min) 45 min    Activity Tolerance Patient limited by pain              Past Medical History:  Diagnosis Date   Acute renal injury ([place]) 06/28/2017   Anxiety    Aspiration pneumonia of right upper lobe due to gastric secretions ([place])    Diverticulitis    GERD (gastroesophageal reflux disease) 01/08/2018   Seasonal allergies    Syncope 06/26/2021   Past Surgical History:  Procedure Laterality Date   BIOPSY  03/18/2021   Procedure: BIOPSY;  Surgeon: Harvel Quale, MD;  [facility];  Service: Gastroenterology;;  esophageal at Z-line   BIOPSY  05/23/2021   Procedure: BIOPSY;  Surgeon: Harvel Quale, MD;  [facility];  Service: Gastroenterology;;   CARDIOPULMONARY EXERCISE TEST (CPX)  01/09/2022   Interpretation limited by submaximal effort.  Severe functional impairment when compared to max sedentary norms.  No cardiopulmonary limitations.  Noted tremor and generalized weakness that lead to premature exercise termination-consider neuromuscular evaluation.   COLONOSCOPY N/A 09/22/2017   Procedure: COLONOSCOPY;  Surgeon: Rogene Houston, MD;  [facility];  Service: Endoscopy;  Laterality: N/A;  9:55   COLONOSCOPY WITH PROPOFOL N/A 05/23/2021   Procedure: COLONOSCOPY WITH PROPOFOL;  Surgeon: Harvel Quale, MD;  [facility];  Service: Gastroenterology;  Laterality: N/A;  7:30   ESOPHAGOGASTRODUODENOSCOPY (EGD) WITH PROPOFOL N/A 03/18/2021   Procedure:  ESOPHAGOGASTRODUODENOSCOPY (EGD) WITH PROPOFOL;  Surgeon: Harvel Quale, MD;  [facility];  Service: Gastroenterology;  Laterality: N/A;   ESOPHAGOGASTRODUODENOSCOPY (EGD) WITH PROPOFOL N/A 05/23/2021   Procedure: ESOPHAGOGASTRODUODENOSCOPY (EGD) WITH PROPOFOL;  Surgeon: Harvel Quale, MD;  [facility];  Service: Gastroenterology;  Laterality: N/A;   POLYPECTOMY  09/22/2017   Procedure: POLYPECTOMY;  Surgeon: Rogene Houston, MD;  [facility];  Service: Endoscopy;;   RIGHT/LEFT HEART CATH AND CORONARY ANGIOGRAPHY N/A 12/12/2021   Procedure: RIGHT/LEFT HEART CATH AND CORONARY ANGIOGRAPHY;  Surgeon: Jolaine Artist, MD;  Location: [facility];  Service: Cardiovascular:: Normal Coronaries & Hemodynamics.  EF 50-55%. RA = 4 mmHg, RV = 23/4; PA = 22/6 (14) & PCW = 7; Ao sat = 95%; PA sat = 76%, 76%& SVC sat = 74%Fick cardiac output/index = 7.1/3.0; PVR = 1.0 WU   TRANSTHORACIC ECHOCARDIOGRAM  02/24/2021   EF 60 to 65%.  Normal LV function.  Normal valves.  Mild RV dilation with normal function.   ZIO PATCH EVENT MONITOR  01/2022   Sinus rhythm with heart rate range 24 to 159 bpm, 1  AVB and Wenckebach block noted along with rare PACs and PVCs.  No arrhythmias.   Patient Active Problem List   Diagnosis Date Noted   Erythrocytosis 12/03/2022   Encounter for general adult medical examination with abnormal findings 12/02/2022   OSA (obstructive sleep apnea) 12/02/2022  Urinary retention 11/05/2022   Cervical spinal stenosis 07/29/2022   DDD (degenerative disc disease), lumbar 07/29/2022   NASH (nonalcoholic steatohepatitis) 06/10/2022   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/2022   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   PSVT (paroxysmal supraventricular tachycardia) 03/25/2022   Itching 03/07/2022   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   Bloating 03/07/2022   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   [facility]  spotted fever 12/31/2021   Encounter for examination following treatment at hospital 12/31/2021   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   Chronic sinusitis 12/04/2021   Mixed hyperlipidemia 11/30/2021   Esophageal hiatal hernia 11/22/2021   Irritable bowel syndrome with constipation 11/22/2021   Dyspnea on minimal exertion 06/28/2021   Anxiety 06/26/2021   Eczema 06/26/2021   Barrett's esophagus 04/11/2021   History of colonic polyps 04/11/2021   Focal neurological deficit 02/23/2021   GERD (gastroesophageal reflux disease) 01/08/2018    PCP: Ihor Dow, MD  REFERRING PROVIDER: Judith Part, MD  REFERRING DIAG: M41.9 (ICD-10-CM) - Scoliosis, unspecified  THERAPY DIAG:  Low back pain, unspecified back pain laterality, unspecified chronicity, unspecified whether sciatica present  Rationale for Evaluation and Treatment: Rehabilitation  ONSET DATE: chronic; worse over the last year  SUBJECTIVE:   SUBJECTIVE STATEMENT: Pain gets worse towards the end of the day and at night (10/10 on NPRS). Currently rates pain = 2/10 on NPRS. Denies shooting pain down the legs on B.  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  PCP referred to [facility].  MRI done of the thoracic spine; referred to therapy; he also goes to chiropractor some; Dr. Bethann Goo.  Reports weak feeling in legs  PERTINENT HISTORY: Last year [facility] spotted fever with subsequent  tremors and passing out Large hiatal hernia. See above PAIN:  Are you having pain? Yes: NPRS scale: 2/10 Pain location: back from neck down to hip Pain description: sore and aching, stiff Aggravating factors: working, overworking Relieving factors: medication  PRECAUTIONS: None  WEIGHT BEARING RESTRICTIONS: No  FALLS:  Has patient fallen in last 6 months? No  LIVING ENVIRONMENT: Lives with: lives with their family Lives in: House/apartment Stairs: Yes: External: 6 steps; on  right going up, on left going up, and can reach both Has following equipment at home: None  OCCUPATION: Tree surgeon maintenance  PLOF: Independent  PATIENT GOALS: help my back feel better  NEXT MD VISIT: Dr. Jabier Mutton (neurologist)  OBJECTIVE:   DIAGNOSTIC FINDINGS: IMPRESSION: 1. Mild degenerative changes of the thoracic spine without high-grade spinal canal or neural foraminal stenosis at any level. 2. Exaggerated thoracic kyphosis related to minimal chronic compression fractures, as detailed above. 3. Large hiatal hernia.  IMPRESSION: 1. Moderate right foraminal encroachment due to spurring and disc protrusion. Slight cord flattening on the right due to spurring. 2. Otherwise no significant spinal stenosis or cord compression.     Electronically Signed   By: Pedro Earls M.D.   On: 09/06/2022 14:22  PATIENT SURVEYS:  Modified Oswestry 15/50 30%   COGNITION: Overall cognitive status: Within functional limits for tasks assessed     SENSATION: Reports occasional numbness right thigh   POSTURE: rounded shoulders, forward head, and increased thoracic kyphosis slight right side rib hump; significant kyphosis thoracic spine  PALPATION: General tenderness of the back on palpation  LOWER EXTREMITY MMT:  MMT Right eval Left eval  Hip flexion 4+* 5  Hip extension 4 4+  Hip abduction  Hip adduction    Hip internal rotation    Hip external rotation    Knee flexion 5 4+  Knee extension 5* 5  Ankle dorsiflexion 5 5  Ankle plantarflexion    Ankle inversion    Ankle eversion     (Blank rows = not tested)   LUMBAR ROM:   Active  A/PROM  eval  Flexion 85% available *  Extension 50% available*  Right lateral flexion To knee  Left lateral flexion To just above knee  Right rotation   Left rotation    (Blank rows = not tested)   FUNCTIONAL TESTS:  5 times sit to stand: 18.80 Sec   TODAY'S TREATMENT:                                                                                                                               DATE:  12/20/22 Grade 1 long axis distraction on L hip x 30" x 3 followed by Grade 1 inf thrust on the last rep Hooklying hip adduction x 3" x 10 x 2 Bridging x 10 x 2 Hooklying hip abd isom with a belt x 3" x 10 x 2 Supine R sciatic nerve floss with pillow x 1' Prone on elbows x 5" x 10 Prone press ups x 5" x 10 Seated hamstring stretch x 30" x 3  12/17/2022 Review of HEP and goals Decompression exercises 5" hold x 8 each Head press Shoulder press Leg lengthener Leg press  12/11/22 physical therapy evaluation and HEP instruction    PATIENT EDUCATION:  Education details: Patient educated on exam findings, POC, scope of PT, HEP, and what . Person educated: Patient Education method: Explanation, Demonstration, and Handouts Education comprehension: verbalized understanding, returned demonstration, verbal cues required, and tactile cues required HOME EXERCISE PROGRAM: 12/20/22 - Supine Bridge  - 1-2 x daily - 7 x weekly - 2 sets - 10 reps - Supine Hip Adduction Isometric with Ball  - 1-2 x daily - 7 x weekly - 2 sets - 10 reps - 3 hold - Hooklying Isometric Hip Abduction with Belt  - 1-2 x daily - 7 x weekly - 1-2 sets - 10 reps - 3 hold - Seated Hamstring Stretch  - 1-2 x daily - 7 x weekly - 3 reps - 30 hold - Supine Sciatic Nerve Mobilization With Leg on Pillow  - 1-2 x daily - 7 x weekly 12/17/2022 decompression exercises; hold all other HEP for now  Access Code: [email or web address] URL: [email or web address] Date: 12/11/2022 Prepared by: [facility]  Exercises - Static Prone on Elbows  - 4 x daily - 7 x weekly - 1 sets - 1 reps - 2 minutes hold - Prone Press Up  - 4 x daily - 7 x weekly - 1 sets - 10 reps - Seated Thoracic Lumbar Extension  - 4 x daily - 7 x weekly - 1 sets - 10 reps - Doorway Pec Stretch at  90 Degrees Abduction  - 2 x daily - 7 x weekly - 1 sets - 5 reps - 20 sec  hold  ASSESSMENT:  CLINICAL IMPRESSION: Patient presents a slight pelvic upslip on the L as manifested by R LE longer than the L. R LE did not get shorter in supine-to-sit. L iliac crest is higher in standing. After the hip distraction, patient's B medial malleoli were level in supine. Interventions today were geared towards centralization of symptoms, LE flexibility, and hip strengthening. Tolerated all activities without worsening of symptoms. No shooting pain reported on the legs on today's activity. Demonstrated appropriate levels of fatigue. Required slight amount of cueing to ensure correct execution of activity. To date, skilled PT is required to address the impairments and improve function.   Eval:Patient is a 42 y.o. male who was seen today for physical therapy evaluation and treatment for M41.9 (ICD-10-CM) - Scoliosis, unspecified. Patient presents to physical therapy with complaint of back pain. Patient demonstrates muscle weakness, reduced ROM, and fascial restrictions which are likely contributing to symptoms of pain and are negatively impacting patient ability to perform ADLs and functional mobility tasks. Patient will benefit from skilled physical therapy services to address these deficits to reduce pain and improve level of function with ADLs and functional mobility tasks.   OBJECTIVE IMPAIRMENTS: Abnormal gait, decreased activity tolerance, decreased knowledge of condition, decreased mobility, difficulty walking, decreased ROM, decreased strength, hypomobility, increased fascial restrictions, impaired perceived functional ability, impaired flexibility, postural dysfunction, and pain.   ACTIVITY LIMITATIONS: carrying, lifting, bending, standing, squatting, sleeping, reach over head, locomotion level, and caring for others  PARTICIPATION LIMITATIONS: meal prep, cleaning, laundry, shopping, community activity, occupation, and yard work  PERSONAL FACTORS: Profession are also affecting  patient's functional outcome.   REHAB POTENTIAL: Good  CLINICAL DECISION MAKING: Stable/uncomplicated  EVALUATION COMPLEXITY: Low   GOALS: Goals reviewed with patient? No  SHORT TERM GOALS: Target date: 12/25/2022 patient will be independent with initial HEP  Baseline: Goal status: IN PROGRESS  2.  Patient will self report 30% improvement to improve tolerance for functional activity   Baseline:  Goal status: IN PROGRESS   LONG TERM GOALS: Target date: 01/08/2023  Patient will be independent in self management strategies to improve quality of life and functional outcomes.   Baseline:  Goal status: IN PROGRESS  2.  Patient will improve modified Oswestry score by 10 points to demonstrate improved functional mobility Baseline: 15/50 Goal status: IN PROGRESS  3.  Patient will increase bilateral leg MMTs to 5/5 without pain to promote return to ambulation community distances with minimal deviation.  Baseline: see above Goal status: IN PROGRESS  4.  Patient will self report 50% improvement to improve tolerance for functional activity  Baseline:  Goal status: IN PROGRESS   PLAN:  PT FREQUENCY: 2x/week  PT DURATION: 4 weeks  PLANNED INTERVENTIONS: Therapeutic exercises, Therapeutic activity, Neuromuscular re-education, Balance training, Gait training, Patient/Family education, Joint manipulation, Joint mobilization, Stair training, Orthotic/Fit training, DME instructions, Aquatic Therapy, Dry Needling, Electrical stimulation, Spinal manipulation, Spinal mobilization, Cryotherapy, Moist heat, Compression bandaging, scar mobilization, Splintting, Taping, Traction, Ultrasound, Ionotophoresis 45m/ml Dexamethasone, and Manual therapy   PLAN FOR NEXT SESSION:  Continue POC and may progress as tolerated with emphasis on postural and core strengthening; lower extremity strengthening; body mechanics training; decompression exercises  Maliha Outten L. Alizaya Oshea, PT, DPT,  OCS Board-Certified Clinical Specialist in [place] ([place]): [id number] 2:16 PM, 12/20/22

## 2022-12-21 ENCOUNTER — Other Ambulatory Visit (INDEPENDENT_AMBULATORY_CARE_PROVIDER_SITE_OTHER): Payer: Self-pay | Admitting: Gastroenterology

## 2022-12-21 ENCOUNTER — Encounter: Payer: Self-pay | Admitting: Pulmonary Disease

## 2022-12-21 DIAGNOSIS — K581 Irritable bowel syndrome with constipation: Secondary | ICD-10-CM

## 2022-12-23 ENCOUNTER — Ambulatory Visit (HOSPITAL_COMMUNITY)
Admission: RE | Admit: 2022-12-23 | Discharge: 2022-12-23 | Disposition: A | Discharge status: Home / Self Care | Payer: 59 | Source: Ambulatory Visit | Attending: Family Medicine | Admitting: Family Medicine

## 2022-12-23 ENCOUNTER — Encounter: Payer: Self-pay | Admitting: Family Medicine

## 2022-12-23 ENCOUNTER — Ambulatory Visit (INDEPENDENT_AMBULATORY_CARE_PROVIDER_SITE_OTHER): Payer: 59 | Admitting: Family Medicine

## 2022-12-23 VITALS — BP 122/88 | HR 109 | Ht 75.0 in | Wt 265.1 lb

## 2022-12-23 DIAGNOSIS — R5383 Other fatigue: Secondary | ICD-10-CM

## 2022-12-23 DIAGNOSIS — R0602 Shortness of breath: Secondary | ICD-10-CM | POA: Insufficient documentation

## 2022-12-23 DIAGNOSIS — K449 Diaphragmatic hernia without obstruction or gangrene: Secondary | ICD-10-CM | POA: Diagnosis not present

## 2022-12-23 DIAGNOSIS — R0609 Other forms of dyspnea: Secondary | ICD-10-CM | POA: Diagnosis not present

## 2022-12-23 MED ORDER — ALBUTEROL SULFATE HFA 108 (90 BASE) MCG/ACT IN AERS: 2.0000 | INHALATION_SPRAY | Freq: Four times a day (QID) | RESPIRATORY_TRACT | 1 refills | Status: DC | PRN

## 2022-12-23 NOTE — Progress Notes (Signed)
Acute Office Visit  Subjective:    Patient ID: Elijah Shaffer, male    DOB: December 27, 1980, 42 y.o.   MRN: [id number]  Chief Complaint  Patient presents with   Shortness of Breath    Pt reports feeling sob, body feels really fatigued, onset of sx began 2 days ago (12/21/2022). Just very sluggish.     HPI Patient is in today for with complaints of shortness of breath with exertion for 2 days.Marland Kitchenglodeta  Past Medical History:  Diagnosis Date   Acute renal injury ([place]) 06/28/2017   Anxiety    Aspiration pneumonia of right upper lobe due to gastric secretions ([place])    Diverticulitis    GERD (gastroesophageal reflux disease) 01/08/2018   Seasonal allergies    Syncope 06/26/2021    Past Surgical History:  Procedure Laterality Date   BIOPSY  03/18/2021   Procedure: BIOPSY;  Surgeon: Harvel Quale, MD;  [facility];  Service: Gastroenterology;;  esophageal at [facility]   BIOPSY  05/23/2021   Procedure: BIOPSY;  Surgeon: Harvel Quale, MD;  [facility]: [facility];  Service: Gastroenterology;;   CARDIOPULMONARY EXERCISE TEST (CPX)  01/09/2022   Interpretation limited by submaximal effort.  Severe functional impairment when compared to max sedentary norms.  No cardiopulmonary limitations.  Noted tremor and generalized weakness that lead to premature exercise termination-consider neuromuscular evaluation.   COLONOSCOPY N/A 09/22/2017   Procedure: COLONOSCOPY;  Surgeon: Rogene Houston, MD;  [facility]: [facility];  Service: Endoscopy;  Laterality: N/A;  9:55   COLONOSCOPY WITH PROPOFOL N/A 05/23/2021   Procedure: COLONOSCOPY WITH PROPOFOL;  Surgeon: Harvel Quale, MD;  [facility];  Service: Gastroenterology;  Laterality: N/A;  7:30   ESOPHAGOGASTRODUODENOSCOPY (EGD) WITH PROPOFOL N/A 03/18/2021   Procedure: ESOPHAGOGASTRODUODENOSCOPY (EGD) WITH PROPOFOL;  Surgeon: Harvel Quale, MD;  [facility];  Service:  Gastroenterology;  Laterality: N/A;   ESOPHAGOGASTRODUODENOSCOPY (EGD) WITH PROPOFOL N/A 05/23/2021   Procedure: ESOPHAGOGASTRODUODENOSCOPY (EGD) WITH PROPOFOL;  Surgeon: Harvel Quale, MD;  [facility];  Service: Gastroenterology;  Laterality: N/A;   POLYPECTOMY  09/22/2017   Procedure: POLYPECTOMY;  Surgeon: Rogene Houston, MD;  [facility];  Service: Endoscopy;;   RIGHT/LEFT HEART CATH AND CORONARY ANGIOGRAPHY N/A 12/12/2021   Procedure: RIGHT/LEFT HEART CATH AND CORONARY ANGIOGRAPHY;  Surgeon: Jolaine Artist, MD;  Location: [facility];  Service: Cardiovascular:: Normal Coronaries & Hemodynamics.  EF 50-55%. RA = 4 mmHg, RV = 23/4; PA = 22/6 (14) & PCW = 7; Ao sat = 95%; PA sat = 76%, 76%& SVC sat = 74%Fick cardiac output/index = 7.1/3.0; PVR = 1.0 WU   TRANSTHORACIC ECHOCARDIOGRAM  02/24/2021   EF 60 to 65%.  Normal LV function.  Normal valves.  Mild RV dilation with normal function.   ZIO PATCH EVENT MONITOR  01/2022   Sinus rhythm with heart rate range 24 to 159 bpm, 1  AVB and Wenckebach block noted along with rare PACs and PVCs.  No arrhythmias.    Family History  Problem Relation Age of Onset   Healthy Mother    Cancer Father    Colon cancer Maternal Grandmother    Colon cancer Maternal Grandfather     Social History   Socioeconomic History   Marital status: Single    Spouse name: Not on file   Number of children: Not on file   Years of education: Not on file   Highest education  level: Not on file  Occupational History   Not on file  Tobacco Use   Smoking status: Never    Passive exposure: Past   Smokeless tobacco: Never  Vaping Use   Vaping Use: Never used  Substance and Sexual Activity   Alcohol use: Yes    Comment: occ   Drug use: No   Sexual activity: Not on file  Other Topics Concern   Not on file  Social History Narrative   Not on file   Social Determinants of Health   Financial Resource Strain: Not on  file  Food Insecurity: Not on file  Transportation Needs: Not on file  Physical Activity: Insufficiently Active (03/29/2022)   Exercise Vital Sign    Days of Exercise per Week: 2 days    Minutes of Exercise per Session: 10 min  Stress: Stress Concern Present (05/07/2022)   [place]    Feeling of Stress : To some extent  Social Connections: Not on file  Intimate Partner Violence: Not on file    Outpatient Medications Prior to Visit  Medication Sig Dispense Refill   aspirin EC 81 MG tablet Take 81 mg by mouth daily. Swallow whole.     b complex vitamins capsule Take 1 capsule by mouth daily. With D3 gummie     betamethasone dipropionate 0.05 % lotion Apply 1-2 times daily to affected itchy areas at scalp. Avoid applying to face, groin, and axilla. Use as directed. Long-term use can cause thinning of the skin. 60 mL 2   cetirizine (ZYRTEC) 10 MG tablet Take 10 mg by mouth daily.     clindamycin (CLEOCIN T) 1 % external solution Apply topically to acne bumps at aa's qd 30 mL 3   clobetasol (TEMOVATE) 0.05 % external solution Apply 1 application. topically 2 (two) times daily. Use as directed.  Mix with cerave cream. Use for up to 4 weeks. Avoid applying to face, groin, and axilla. Use as directed. 50 mL 1   clobetasol cream (TEMOVATE) 0.05 % Apply small amount daily before bed to right ankle or other areas of body as needed for itchy rash Avoid applying to face, groin, and axilla Use as directed. 30 g 0   cyclobenzaprine (FLEXERIL) 5 MG tablet Take 1 tablet (5 mg total) by mouth at bedtime as needed for muscle spasms. 30 tablet 1   diphenhydrAMINE-zinc acetate (BENADRYL EXTRA STRENGTH) cream Apply 1 application topically 3 (three) times daily as needed for itching. 28.4 g 0   docusate sodium (COLACE) 100 MG capsule Take 100 mg by mouth 2 (two) times daily.     DULoxetine (CYMBALTA) 30 MG capsule Take 60 mg by mouth daily.      Dupilumab (DUPIXENT) 300 MG/2ML SOPN Inject 300 mg into the skin every 14 (fourteen) days. Starting at day 15 for maintenance. 4 mL 5   Fluocinolone Acetonide 0.01 % OIL Use 1 - 2 drop qd/bid prn for scale at ears 20 mL 2   fluocinonide (LIDEX) 0.05 % external solution Apply once or twice daily to affected areas on scalp up to 2 weeks as needed for itching. Avoid applying to face, groin, and axilla. 60 mL 2   FLUoxetine (PROZAC) 20 MG capsule Take 1 capsule (20 mg total) by mouth daily. 90 capsule 1   fluticasone (FLONASE ALLERGY RELIEF) 50 MCG/ACT nasal spray Place 2 sprays into both nostrils daily as needed for allergies or rhinitis. (Patient taking differently: Place 2 sprays into  both nostrils daily.) 9.9 mL 2   fluticasone-salmeterol (ADVAIR) 250-50 MCG/ACT AEPB as needed.     gabapentin (NEURONTIN) 300 MG capsule Take 300 mg by mouth 3 (three) times daily.     hydrOXYzine (VISTARIL) 25 MG capsule Take 1 capsule (25 mg total) by mouth every 8 (eight) hours as needed. Take before bed as needed for itchy. Can cause drowsiness. 60 capsule 2   ketoconazole (NIZORAL) 2 % shampoo Apply 1 Application topically daily as needed for irritation. 120 mL 11   lubiprostone (AMITIZA) 8 MCG capsule TAKE 1 CAPSULE BY MOUTH TWICE DAILY WITH A MEAL 180 capsule 0   montelukast (SINGULAIR) 10 MG tablet TAKE 1 TABLET BY MOUTH AT BEDTIME 30 tablet 1   naproxen (NAPROSYN) 500 MG tablet Take 500 mg by mouth 2 (two) times daily.     omega-3 acid ethyl esters (LOVAZA) 1 g capsule Take 2 capsules (2 g total) by mouth 2 (two) times daily. 120 capsule 3   omeprazole (PRILOSEC) 40 MG capsule Take 1 capsule (40 mg total) by mouth 2 (two) times daily. 180 capsule 3   ondansetron (ZOFRAN) 4 MG tablet Take 1 tablet (4 mg total) by mouth every 6 (six) hours as needed for nausea. 20 tablet 0   Potassium 99 MG TABS Take 1 tablet by mouth daily.     propranolol (INDERAL) 20 MG tablet Take one tablet 20 mg  twice daily  may take an   additional dose of 20 mg  as needed for palpitations 190 tablet 3   senna (SENOKOT) 8.6 MG tablet Take by mouth.     tacrolimus (PROTOPIC) 0.1 % ointment APPLY TOPICALLY TO AFFECTED AREAS OF BODY FOR RASH DAILY OR TWICE DAILY AS NEEDED 30 g 0   No facility-administered medications prior to visit.    Allergies  Allergen Reactions   Gramineae Pollens Itching   Claritin [Loratadine] Other (See Comments)    Heart race   Loratadine-Pseudoephedrine Er Palpitations   Pollen Extract Other (See Comments)    Seasonal allergies    Review of Systems  Constitutional:  Positive for fatigue. Negative for fever.  HENT:  Negative for congestion.   Eyes:  Negative for visual disturbance.  Respiratory:  Positive for shortness of breath. Negative for apnea, cough, choking, chest tightness, wheezing and stridor.   Cardiovascular:  Negative for chest pain, palpitations and leg swelling.  Neurological:  Positive for tremors (right hand). Negative for dizziness and headaches.       Objective:    Physical Exam HENT:     Head: Normocephalic.     Right Ear: External ear normal.     Left Ear: External ear normal.     Nose: No congestion or rhinorrhea.     Mouth/Throat:     Mouth: Mucous membranes are moist.  Eyes:     Extraocular Movements: Extraocular movements intact.     Pupils: Pupils are equal, round, and reactive to light.  Cardiovascular:     Rate and Rhythm: Regular rhythm.     Heart sounds: No murmur heard. Pulmonary:     Effort: No respiratory distress.     Breath sounds: Normal breath sounds. No decreased breath sounds or wheezing.  Neurological:     Mental Status: He is alert and oriented to person, place, and time.     GCS: GCS eye subscore is 4. GCS verbal subscore is 5. GCS motor subscore is 6.     Gait: Gait normal.     BP  122/88 (BP Location: Left Arm)   Pulse (!) 109   Ht 6' 3"$  (1.905 m)   Wt 265 lb 1.9 oz (120.3 kg)   SpO2 96%   BMI 33.14 kg/m  Wt Readings from Last  3 Encounters:  12/23/22 265 lb 1.9 oz (120.3 kg)  12/02/22 264 lb 6.4 oz (119.9 kg)  11/05/22 264 lb 3.2 oz (119.8 kg)       Assessment & Plan:  SOB (shortness of breath) on exertion Assessment & Plan: Fatigued and dyspnea on exertion with unclear etiology Denies chest pain, palpitation, chest tightness, lower extremity edema EKG in office shows sinus rhythm Checks x-ray negative for cardiopulmonary findings Patient reports having similar symptoms when he had a tick bite Will get Lyme and [facility] spotted fever titers Patient reports  following up with pulmonary and cardiology Encouraged to follow-up with neurology concerning right hand tremors Will assess testosterone levels today  Encouraged to continue using his albuterol inhaler as needed  Orders: -     CBC -     Lyme Disease Serology w/Reflex -     [facility] spotted fvr abs pnl(IgG+IgM) -     DG Chest 2 View -     EKG 12-Lead -     BMP8+EGFR  Other fatigue -     Testosterone,Free and Total  Dyspnea on exertion -     Albuterol Sulfate HFA; Inhale 2 puffs into the lungs every 6 (six) hours as needed for shortness of breath.  Dispense: 8 g; Refill: [place], FNP

## 2022-12-23 NOTE — Assessment & Plan Note (Addendum)
Fatigued and dyspnea on exertion with unclear etiology Denies chest pain, palpitation, chest tightness, lower extremity edema EKG in office shows sinus rhythm Checks x-ray negative for cardiopulmonary findings Patient reports having similar symptoms when he had a tick bite Will get Lyme and [facility] spotted fever titers Patient reports  following up with pulmonary and cardiology Encouraged to follow-up with neurology concerning right hand tremors Will assess testosterone levels today  Encouraged to continue using his albuterol inhaler as needed

## 2022-12-23 NOTE — Patient Instructions (Addendum)
I appreciate the opportunity to provide care to you today!    Follow up: as needed   Labs: please stop by the lab today to get your blood drawn (CBC, lyme and [place] titer  Please return in the morning for your testosterone levels  Please stop by [facility] hospital anytime to get an x-ray of  your chest      Please continue to a heart-healthy diet and increase your physical activities. Try to exercise for 29mns at least five times a week.      It was a pleasure to see you and I look forward to continuing to work together on your health and well-being. Please do not hesitate to call the office if you need care or have questions about your care.   Have a wonderful day and week. With Gratitude, GAlvira MondayMSN, FNP-BC

## 2022-12-24 DIAGNOSIS — R5383 Other fatigue: Secondary | ICD-10-CM | POA: Diagnosis not present

## 2022-12-25 ENCOUNTER — Ambulatory Visit (HOSPITAL_COMMUNITY): Payer: 59

## 2022-12-25 DIAGNOSIS — D751 Secondary polycythemia: Secondary | ICD-10-CM | POA: Diagnosis not present

## 2022-12-25 DIAGNOSIS — M545 Low back pain, unspecified: Secondary | ICD-10-CM

## 2022-12-25 DIAGNOSIS — M546 Pain in thoracic spine: Secondary | ICD-10-CM

## 2022-12-25 LAB — CBC
Hematocrit: 53.8 % — ABNORMAL HIGH (ref 37.5–51.0)
Hemoglobin: 18.2 g/dL — ABNORMAL HIGH (ref 13.0–17.7)
MCH: 30 pg (ref 26.6–33.0)
MCHC: 33.8 g/dL (ref 31.5–35.7)
MCV: 89 fL (ref 79–97)
Platelets: 285 10*3/uL (ref 150–450)
RBC: 6.06 x10E6/uL — ABNORMAL HIGH (ref 4.14–5.80)
RDW: 12.7 % (ref 11.6–15.4)
WBC: 8.6 10*3/uL (ref 3.4–10.8)

## 2022-12-25 LAB — LYME DISEASE SEROLOGY W/REFLEX: Lyme Total Antibody EIA: NEGATIVE

## 2022-12-25 NOTE — Therapy (Signed)
OUTPATIENT PHYSICAL THERAPY LUMBAR AND THORACIC TREATMENT   Patient Name: Elijah Shaffer MRN: [id number] DOB:08-Dec-1980, 42 y.o., male Today's Date: 12/25/2022  END OF SESSION:  PT End of Session - 12/25/22 0825     Visit Number 4    Number of Visits 8    Date for PT Re-Evaluation 01/08/23    Authorization Type [employer]; no VL and no auth    Progress Note Due on Visit 8    PT Start Time 0820    PT Stop Time 0900    PT Time Calculation (min) 40 min    Activity Tolerance Patient tolerated treatment well            Past Medical History:  Diagnosis Date   Acute renal injury ([place]) 06/28/2017   Anxiety    Aspiration pneumonia of right upper lobe due to gastric secretions ([place])    Diverticulitis    GERD (gastroesophageal reflux disease) 01/08/2018   Seasonal allergies    Syncope 06/26/2021   Past Surgical History:  Procedure Laterality Date   BIOPSY  03/18/2021   Procedure: BIOPSY;  Surgeon: Harvel Quale, MD;  [facility];  Service: Gastroenterology;;  esophageal at [facility]   BIOPSY  05/23/2021   Procedure: BIOPSY;  Surgeon: Harvel Quale, MD;  [facility];  Service: Gastroenterology;;   CARDIOPULMONARY EXERCISE TEST (CPX)  01/09/2022   Interpretation limited by submaximal effort.  Severe functional impairment when compared to max sedentary norms.  No cardiopulmonary limitations.  Noted tremor and generalized weakness that lead to premature exercise termination-consider neuromuscular evaluation.   COLONOSCOPY N/A 09/22/2017   Procedure: COLONOSCOPY;  Surgeon: Rogene Houston, MD;  [facility];  Service: Endoscopy;  Laterality: N/A;  9:55   COLONOSCOPY WITH PROPOFOL N/A 05/23/2021   Procedure: COLONOSCOPY WITH PROPOFOL;  Surgeon: Harvel Quale, MD;  [facility];  Service: Gastroenterology;  Laterality: N/A;  7:30   ESOPHAGOGASTRODUODENOSCOPY (EGD) WITH PROPOFOL N/A 03/18/2021   Procedure:  ESOPHAGOGASTRODUODENOSCOPY (EGD) WITH PROPOFOL;  Surgeon: Harvel Quale, MD;  [facility];  Service: Gastroenterology;  Laterality: N/A;   ESOPHAGOGASTRODUODENOSCOPY (EGD) WITH PROPOFOL N/A 05/23/2021   Procedure: ESOPHAGOGASTRODUODENOSCOPY (EGD) WITH PROPOFOL;  Surgeon: Harvel Quale, MD;  [facility];  Service: Gastroenterology;  Laterality: N/A;   POLYPECTOMY  09/22/2017   Procedure: POLYPECTOMY;  Surgeon: Rogene Houston, MD;  [facility];  Service: Endoscopy;;   RIGHT/LEFT HEART CATH AND CORONARY ANGIOGRAPHY N/A 12/12/2021   Procedure: RIGHT/LEFT HEART CATH AND CORONARY ANGIOGRAPHY;  Surgeon: Jolaine Artist, MD;  Location: [facility];  Service: Cardiovascular:: Normal Coronaries & Hemodynamics.  EF 50-55%. RA = 4 mmHg, RV = 23/4; PA = 22/6 (14) & PCW = 7; Ao sat = 95%; PA sat = 76%, 76%& SVC sat = 74%Fick cardiac output/index = 7.1/3.0; PVR = 1.0 WU   TRANSTHORACIC ECHOCARDIOGRAM  02/24/2021   EF 60 to 65%.  Normal LV function.  Normal valves.  Mild RV dilation with normal function.   ZIO PATCH EVENT MONITOR  01/2022   Sinus rhythm with heart rate range 24 to 159 bpm, 1  AVB and Wenckebach block noted along with rare PACs and PVCs.  No arrhythmias.   Patient Active Problem List   Diagnosis Date Noted   Erythrocytosis 12/03/2022   Encounter for general adult medical examination with abnormal findings 12/02/2022   OSA (obstructive sleep apnea) 12/02/2022   Urinary retention  11/05/2022   Cervical spinal stenosis 07/29/2022   DDD (degenerative disc disease), lumbar 07/29/2022   NASH (nonalcoholic steatohepatitis) 06/10/2022   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/2022   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   PSVT (paroxysmal supraventricular tachycardia) 03/25/2022   Itching 03/07/2022   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   Bloating 03/07/2022   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   [facility]  spotted fever 12/31/2021   Encounter for examination following treatment at hospital 12/31/2021   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   Chronic sinusitis 12/04/2021   Mixed hyperlipidemia 11/30/2021   Esophageal hiatal hernia 11/22/2021   Irritable bowel syndrome with constipation 11/22/2021   SOB (shortness of breath) on exertion 06/28/2021   Anxiety 06/26/2021   Eczema 06/26/2021   Barrett's esophagus 04/11/2021   History of colonic polyps 04/11/2021   Focal neurological deficit 02/23/2021   GERD (gastroesophageal reflux disease) 01/08/2018    PCP: Ihor Dow, MD  REFERRING PROVIDER: Judith Part, MD  REFERRING DIAG: M41.9 (ICD-10-CM) - Scoliosis, unspecified  THERAPY DIAG:  Low back pain, unspecified back pain laterality, unspecified chronicity, unspecified whether sciatica present  Pain in thoracic spine  Rationale for Evaluation and Treatment: Rehabilitation  ONSET DATE: chronic; worse over the last year  SUBJECTIVE:   SUBJECTIVE STATEMENT: Patient did not sleep good last night as he has been having tremors. Patient states that he had episodes of SOB and then was extremely fatigued over the weekend. States that he walked some over the weekend which made him SOB. Patient reports that at times, he can feel that he's going to pass out. Patient states that he has been to multiple physicians but they haven't figured out why he's having tremors. Patient reports that he's having some pain on the neck and on the back (5-6/10). Denies any pain on the legs. Patient states that he was good after the last session.  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  PCP referred to [facility].  MRI done of the thoracic spine; referred to therapy; he also goes to chiropractor some; Dr. Bethann Goo.  Reports weak feeling in legs  PERTINENT HISTORY: Last year [facility] spotted fever with subsequent  tremors and passing out Large hiatal  hernia. See above PAIN:  Are you having pain? Yes: NPRS scale: 5-6/10 Pain location: back from neck down to hip Pain description: sore and aching, stiff Aggravating factors: working, overworking Relieving factors: medication  PRECAUTIONS: None  WEIGHT BEARING RESTRICTIONS: No  FALLS:  Has patient fallen in last 6 months? No  LIVING ENVIRONMENT: Lives with: lives with their family Lives in: House/apartment Stairs: Yes: External: 6 steps; on right going up, on left going up, and can reach both Has following equipment at home: None  OCCUPATION: Tree surgeon maintenance  PLOF: Independent  PATIENT GOALS: help my back feel better  NEXT MD VISIT: Dr. Jabier Mutton (neurologist)  OBJECTIVE:   DIAGNOSTIC FINDINGS: IMPRESSION: 1. Mild degenerative changes of the thoracic spine without high-grade spinal canal or neural foraminal stenosis at any level. 2. Exaggerated thoracic kyphosis related to minimal chronic compression fractures, as detailed above. 3. Large hiatal hernia.  IMPRESSION: 1. Moderate right foraminal encroachment due to spurring and disc protrusion. Slight cord flattening on the right due to spurring. 2. Otherwise no significant spinal stenosis or cord compression.     Electronically Signed   By: Pedro Earls M.D.   On: 09/06/2022 14:22  PATIENT SURVEYS:  Modified  Oswestry 15/50 30%   COGNITION: Overall cognitive status: Within functional limits for tasks assessed     SENSATION: Reports occasional numbness right thigh   POSTURE: rounded shoulders, forward head, and increased thoracic kyphosis slight right side rib hump; significant kyphosis thoracic spine  PALPATION: General tenderness of the back on palpation  LOWER EXTREMITY MMT:  MMT Right eval Left eval  Hip flexion 4+* 5  Hip extension 4 4+  Hip abduction    Hip adduction    Hip internal rotation    Hip external rotation    Knee flexion 5 4+  Knee extension 5* 5   Ankle dorsiflexion 5 5  Ankle plantarflexion    Ankle inversion    Ankle eversion     (Blank rows = not tested)   LUMBAR ROM:   Active  A/PROM  eval  Flexion 85% available *  Extension 50% available*  Right lateral flexion To knee  Left lateral flexion To just above knee  Right rotation   Left rotation    (Blank rows = not tested)   FUNCTIONAL TESTS:  5 times sit to stand: 18.80 Sec   TODAY'S TREATMENT:                                                                                                                              DATE:  12/25/22 Hooklying hip adduction x 3" x 10 x 2 Bridging x 10 x 2 Hooklying hip abd, RTB x 3" x 10 x 2 Supine R sciatic nerve floss x 1' LTR x 1' Prone on elbows x 5" x 10 Prone press ups x 5" x 10 Seated hamstring stretch x 30" x 3  12/20/22 Grade 1 long axis distraction on L hip x 30" x 3 followed by Grade 1 inf thrust on the last rep Hooklying hip adduction x 3" x 10 x 2 Bridging x 10 x 2 Hooklying hip abd isom with a belt x 3" x 10 x 2 Supine R sciatic nerve floss with pillow x 1' Prone on elbows x 5" x 10 Prone press ups x 5" x 10 Seated hamstring stretch x 30" x 3  12/17/2022 Review of HEP and goals Decompression exercises 5" hold x 8 each Head press Shoulder press Leg lengthener Leg press  12/11/22 physical therapy evaluation and HEP instruction    PATIENT EDUCATION:  Education details: updated HEP Education method: Explanation, Demonstration, and Handouts Education comprehension: verbalized understanding, returned demonstration, verbal cues required, and tactile cues required HOME EXERCISE PROGRAM: Access Code: [email or web address] URL: [email or web address]  12/25/22 - Lower Trunk Rotations  - 1-2 x daily - 7 x weekly  12/20/22 - Supine Bridge  - 1-2 x daily - 7 x weekly - 2 sets - 10 reps - Supine Hip Adduction Isometric with Ball  - 1-2 x daily - 7 x weekly - 2 sets - 10 reps - 3 hold - Hooklying Isometric Hip  Abduction with Belt  -  1-2 x daily - 7 x weekly - 1-2 sets - 10 reps - 3 hold - Seated Hamstring Stretch  - 1-2 x daily - 7 x weekly - 3 reps - 30 hold - Supine Sciatic Nerve Mobilization With Leg on Pillow  - 1-2 x daily - 7 x weekly  12/17/2022 decompression exercises; hold all other HEP for now  Date: 12/11/2022 Prepared by: [facility]  Exercises - Static Prone on Elbows  - 4 x daily - 7 x weekly - 1 sets - 1 reps - 2 minutes hold - Prone Press Up  - 4 x daily - 7 x weekly - 1 sets - 10 reps - Seated Thoracic Lumbar Extension  - 4 x daily - 7 x weekly - 1 sets - 10 reps - Doorway Pec Stretch at 90 Degrees Abduction  - 2 x daily - 7 x weekly - 1 sets - 5 reps - 20 sec hold  ASSESSMENT:  CLINICAL IMPRESSION: B medial malleoli are still level in supine so hip distraction was not done today. Interventions today were geared towards hip strengthening, LE flexibility, and neural mobility. Tolerated all activities without worsening of symptoms. Demonstrated appropriate mild to moderate level of fatigue. Tremors noted during the first rep of hip adduction squeeze and prone on hands. Rest periods provided and pacing of activities was slow. Required slight to mild amount of cueing to ensure correct execution of activity. To date, skilled PT is required to address the impairments and improve function. Patient has a hx of anxiety and might need to be re-examined by patient's physician as a possible cause of unexplained tremors, SOB, and the feeling of passing out.      Eval:Patient is a 42 y.o. male who was seen today for physical therapy evaluation and treatment for M41.9 (ICD-10-CM) - Scoliosis, unspecified. Patient presents to physical therapy with complaint of back pain. Patient demonstrates muscle weakness, reduced ROM, and fascial restrictions which are likely contributing to symptoms of pain and are negatively impacting patient ability to perform ADLs and functional mobility tasks. Patient will  benefit from skilled physical therapy services to address these deficits to reduce pain and improve level of function with ADLs and functional mobility tasks.   OBJECTIVE IMPAIRMENTS: Abnormal gait, decreased activity tolerance, decreased knowledge of condition, decreased mobility, difficulty walking, decreased ROM, decreased strength, hypomobility, increased fascial restrictions, impaired perceived functional ability, impaired flexibility, postural dysfunction, and pain.   ACTIVITY LIMITATIONS: carrying, lifting, bending, standing, squatting, sleeping, reach over head, locomotion level, and caring for others  PARTICIPATION LIMITATIONS: meal prep, cleaning, laundry, shopping, community activity, occupation, and yard work  PERSONAL FACTORS: Profession are also affecting patient's functional outcome.   REHAB POTENTIAL: Good  CLINICAL DECISION MAKING: Stable/uncomplicated  EVALUATION COMPLEXITY: Low   GOALS: Goals reviewed with patient? No  SHORT TERM GOALS: Target date: 12/25/2022 patient will be independent with initial HEP  Baseline: Goal status: IN PROGRESS  2.  Patient will self report 30% improvement to improve tolerance for functional activity   Baseline:  Goal status: IN PROGRESS   LONG TERM GOALS: Target date: 01/08/2023  Patient will be independent in self management strategies to improve quality of life and functional outcomes.   Baseline:  Goal status: IN PROGRESS  2.  Patient will improve modified Oswestry score by 10 points to demonstrate improved functional mobility Baseline: 15/50 Goal status: IN PROGRESS  3.  Patient will increase bilateral leg MMTs to 5/5 without pain to promote return to ambulation community  distances with minimal deviation.  Baseline: see above Goal status: IN PROGRESS  4.  Patient will self report 50% improvement to improve tolerance for functional activity  Baseline:  Goal status: IN PROGRESS   PLAN:  PT FREQUENCY:  2x/week  PT DURATION: 4 weeks  PLANNED INTERVENTIONS: Therapeutic exercises, Therapeutic activity, Neuromuscular re-education, Balance training, Gait training, Patient/Family education, Joint manipulation, Joint mobilization, Stair training, Orthotic/Fit training, DME instructions, Aquatic Therapy, Dry Needling, Electrical stimulation, Spinal manipulation, Spinal mobilization, Cryotherapy, Moist heat, Compression bandaging, scar mobilization, Splintting, Taping, Traction, Ultrasound, Ionotophoresis 81m/ml Dexamethasone, and Manual therapy   PLAN FOR NEXT SESSION:  Continue POC and may progress as tolerated with emphasis on postural and core strengthening; lower extremity strengthening; body mechanics training; decompression exercises  Latiya Navia L. Zadaya Cuadra, PT, DPT, OCS Board-Certified Clinical Specialist in [place]# ([place]: [id number] 8:25 AM, 12/25/22

## 2022-12-27 ENCOUNTER — Inpatient Hospital Stay: Payer: 59 | Attending: Hematology | Admitting: Hematology

## 2022-12-27 ENCOUNTER — Encounter: Payer: Self-pay | Admitting: Hematology

## 2022-12-27 ENCOUNTER — Ambulatory Visit (HOSPITAL_COMMUNITY): Payer: 59

## 2022-12-27 ENCOUNTER — Encounter (HOSPITAL_COMMUNITY): Payer: 59

## 2022-12-27 ENCOUNTER — Inpatient Hospital Stay: Payer: 59

## 2022-12-27 VITALS — BP 122/87 | HR 84 | Temp 97.6°F | Resp 16 | Ht 75.0 in | Wt 263.0 lb

## 2022-12-27 DIAGNOSIS — Z800 Family history of malignant neoplasm of digestive organs: Secondary | ICD-10-CM

## 2022-12-27 DIAGNOSIS — D751 Secondary polycythemia: Secondary | ICD-10-CM | POA: Diagnosis not present

## 2022-12-27 DIAGNOSIS — M545 Low back pain, unspecified: Secondary | ICD-10-CM | POA: Insufficient documentation

## 2022-12-27 DIAGNOSIS — M546 Pain in thoracic spine: Secondary | ICD-10-CM | POA: Insufficient documentation

## 2022-12-27 LAB — CBC WITH DIFFERENTIAL/PLATELET
Abs Immature Granulocytes: 0.06 10*3/uL (ref 0.00–0.07)
Basophils Absolute: 0.1 10*3/uL (ref 0.0–0.1)
Basophils Relative: 1 %
Eosinophils Absolute: 0.1 10*3/uL (ref 0.0–0.5)
Eosinophils Relative: 1 %
HCT: 47.7 % (ref 39.0–52.0)
Hemoglobin: 16.3 g/dL (ref 13.0–17.0)
Immature Granulocytes: 1 %
Lymphocytes Relative: 32 %
Lymphs Abs: 3.3 10*3/uL (ref 0.7–4.0)
MCH: 30.1 pg (ref 26.0–34.0)
MCHC: 34.2 g/dL (ref 30.0–36.0)
MCV: 88.2 fL (ref 80.0–100.0)
Monocytes Absolute: 0.9 10*3/uL (ref 0.1–1.0)
Monocytes Relative: 8 %
Neutro Abs: 6 10*3/uL (ref 1.7–7.7)
Neutrophils Relative %: 57 %
Platelets: 262 10*3/uL (ref 150–400)
RBC: 5.41 MIL/uL (ref 4.22–5.81)
RDW: 12.9 % (ref 11.5–15.5)
WBC: 10.4 10*3/uL (ref 4.0–10.5)
nRBC: 0 % (ref 0.0–0.2)

## 2022-12-27 LAB — LACTATE DEHYDROGENASE: LDH: 190 U/L (ref 98–192)

## 2022-12-27 NOTE — Therapy (Addendum)
OUTPATIENT PHYSICAL THERAPY LUMBAR AND THORACIC TREATMENT   Patient Name: Elijah Shaffer MRN: [id number] DOB:[birth year], 42 y.o., male Today's Date: 12/27/2022  END OF SESSION:   Past Medical History:  Diagnosis Date   Acute renal injury ([place]) 06/28/2017   Anxiety    Aspiration pneumonia of right upper lobe due to gastric secretions ([place])    Diverticulitis    GERD (gastroesophageal reflux disease) 01/08/2018   Seasonal allergies    Syncope 06/26/2021   Past Surgical History:  Procedure Laterality Date   BIOPSY  03/18/2021   Procedure: BIOPSY;  Surgeon: Harvel Quale, MD;  [facility];  Service: Gastroenterology;;  esophageal at Z-line   BIOPSY  05/23/2021   Procedure: BIOPSY;  Surgeon: Harvel Quale, MD;  [facility];  Service: Gastroenterology;;   CARDIOPULMONARY EXERCISE TEST (CPX)  01/09/2022   Interpretation limited by submaximal effort.  Severe functional impairment when compared to max sedentary norms.  No cardiopulmonary limitations.  Noted tremor and generalized weakness that lead to premature exercise termination-consider neuromuscular evaluation.   COLONOSCOPY N/A 09/22/2017   Procedure: COLONOSCOPY;  Surgeon: Rogene Houston, MD;  [facility];  Service: Endoscopy;  Laterality: N/A;  9:55   COLONOSCOPY WITH PROPOFOL N/A 05/23/2021   Procedure: COLONOSCOPY WITH PROPOFOL;  Surgeon: Harvel Quale, MD;  [facility];  Service: Gastroenterology;  Laterality: N/A;  7:30   ESOPHAGOGASTRODUODENOSCOPY (EGD) WITH PROPOFOL N/A 03/18/2021   Procedure: ESOPHAGOGASTRODUODENOSCOPY (EGD) WITH PROPOFOL;  Surgeon: Harvel Quale, MD;  [facility];  Service: Gastroenterology;  Laterality: N/A;   ESOPHAGOGASTRODUODENOSCOPY (EGD) WITH PROPOFOL N/A 05/23/2021   Procedure: ESOPHAGOGASTRODUODENOSCOPY (EGD) WITH PROPOFOL;  Surgeon: Harvel Quale, MD;  [facility];   Service: Gastroenterology;  Laterality: N/A;   POLYPECTOMY  09/22/2017   Procedure: POLYPECTOMY;  Surgeon: Rogene Houston, MD;  [facility];  Service: Endoscopy;;   RIGHT/LEFT HEART CATH AND CORONARY ANGIOGRAPHY N/A 12/12/2021   Procedure: RIGHT/LEFT HEART CATH AND CORONARY ANGIOGRAPHY;  Surgeon: Jolaine Artist, MD;  Location: [facility];  Service: Cardiovascular:: Normal Coronaries & Hemodynamics.  EF 50-55%. RA = 4 mmHg, RV = 23/4; PA = 22/6 (14) & PCW = 7; Ao sat = 95%; PA sat = 76%, 76%& SVC sat = 74%Fick cardiac output/index = 7.1/3.0; PVR = 1.0 WU   TRANSTHORACIC ECHOCARDIOGRAM  02/24/2021   EF 60 to 65%.  Normal LV function.  Normal valves.  Mild RV dilation with normal function.   ZIO PATCH EVENT MONITOR  01/2022   Sinus rhythm with heart rate range 24 to 159 bpm, 1  AVB and Wenckebach block noted along with rare PACs and PVCs.  No arrhythmias.   Patient Active Problem List   Diagnosis Date Noted   Erythrocytosis 12/03/2022   Encounter for general adult medical examination with abnormal findings 12/02/2022   OSA (obstructive sleep apnea) 12/02/2022   Urinary retention 11/05/2022   Cervical spinal stenosis 07/29/2022   DDD (degenerative disc disease), lumbar 07/29/2022   NASH (nonalcoholic steatohepatitis) 06/10/2022   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/2022   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   PSVT (paroxysmal supraventricular tachycardia) 03/25/2022   Itching 03/07/2022   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   Bloating 03/07/2022   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   [facility] spotted fever 12/31/2021   Encounter for examination following treatment at hospital 12/31/2021   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   Chronic sinusitis 12/04/2021  Mixed hyperlipidemia 11/30/2021   Esophageal hiatal hernia 11/22/2021   Irritable bowel syndrome with constipation 11/22/2021   SOB (shortness of breath) on exertion 06/28/2021   Anxiety  06/26/2021   Eczema 06/26/2021   Barrett's esophagus 04/11/2021   History of colonic polyps 04/11/2021   Focal neurological deficit 02/23/2021   GERD (gastroesophageal reflux disease) 01/08/2018    PCP: Ihor Dow, MD  REFERRING PROVIDER: Judith Part, MD  REFERRING DIAG: M41.9 (ICD-10-CM) - Scoliosis, unspecified  THERAPY DIAG:  No diagnosis found.  Rationale for Evaluation and Treatment: Rehabilitation  ONSET DATE: chronic; worse over the last year  SUBJECTIVE:   SUBJECTIVE STATEMENT: Patient states that he's too tired today as he has been working on tires. Patient denies of any leg pain and back pain. Patient states that he was okay but got tired after the last session.  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  PCP referred to [facility].  MRI done of the thoracic spine; referred to therapy; he also goes to chiropractor some; Dr. Bethann Goo.  Reports weak feeling in legs  PERTINENT HISTORY: Last year [facility] spotted fever with subsequent  tremors and passing out Large hiatal hernia. See above PAIN:  Are you having pain? No  PRECAUTIONS: None  WEIGHT BEARING RESTRICTIONS: No  FALLS:  Has patient fallen in last 6 months? No  LIVING ENVIRONMENT: Lives with: lives with their family Lives in: House/apartment Stairs: Yes: External: 6 steps; on right going up, on left going up, and can reach both Has following equipment at home: None  OCCUPATION: Tree surgeon maintenance  PLOF: Independent  PATIENT GOALS: help my back feel better  NEXT MD VISIT: Dr. Jabier Mutton (neurologist)  OBJECTIVE:   DIAGNOSTIC FINDINGS: IMPRESSION: 1. Mild degenerative changes of the thoracic spine without high-grade spinal canal or neural foraminal stenosis at any level. 2. Exaggerated thoracic kyphosis related to minimal chronic compression fractures, as detailed above. 3. Large hiatal hernia.  IMPRESSION: 1.  Moderate right foraminal encroachment due to spurring and disc protrusion. Slight cord flattening on the right due to spurring. 2. Otherwise no significant spinal stenosis or cord compression.     Electronically Signed   By: Pedro Earls M.D.   On: 09/06/2022 14:22  PATIENT SURVEYS:  Modified Oswestry 15/50 30%   COGNITION: Overall cognitive status: Within functional limits for tasks assessed     SENSATION: Reports occasional numbness right thigh   POSTURE: rounded shoulders, forward head, and increased thoracic kyphosis slight right side rib hump; significant kyphosis thoracic spine  PALPATION: General tenderness of the back on palpation  LOWER EXTREMITY MMT:  MMT Right eval Left eval  Hip flexion 4+* 5  Hip extension 4 4+  Hip abduction    Hip adduction    Hip internal rotation    Hip external rotation    Knee flexion 5 4+  Knee extension 5* 5  Ankle dorsiflexion 5 5  Ankle plantarflexion    Ankle inversion    Ankle eversion     (Blank rows = not tested)   LUMBAR ROM:   Active  A/PROM  eval  Flexion 85% available *  Extension 50% available*  Right lateral flexion To knee  Left lateral flexion To just above knee  Right rotation   Left rotation    (Blank rows = not tested)   FUNCTIONAL TESTS:  5 times sit to stand: 18.80 Sec  TODAY'S TREATMENT:  DATE:  12/27/22 Hooklying hip adduction x 3" x 10 x 2 Bridging with hip abd, GTB, 3" x 10 x 2 Sidelying clamshells, GTB x 10 x 2 on each Supine R sciatic nerve floss x 1' LTR x 1' Prone on elbows x 5" x 10 Prone press ups x 5" x 10 Seated abdominal isometrics with physioball x 3" x 10 x 2 Seated hamstring stretch x 30" x 3 Standing Hip hinges x 10  12/25/22 Hooklying hip adduction x 3" x 10 x 2 Bridging x 10 x 2 Hooklying hip abd, RTB x 3" x 10 x 2 Supine R  sciatic nerve floss x 1' LTR x 1' Prone on elbows x 5" x 10 Prone press ups x 5" x 10 Seated hamstring stretch x 30" x 3  12/20/22 Grade 1 long axis distraction on L hip x 30" x 3 followed by Grade 1 inf thrust on the last rep Hooklying hip adduction x 3" x 10 x 2 Bridging x 10 x 2 Hooklying hip abd isom with a belt x 3" x 10 x 2 Supine R sciatic nerve floss with pillow x 1' Prone on elbows x 5" x 10 Prone press ups x 5" x 10 Seated hamstring stretch x 30" x 3  12/17/2022 Review of HEP and goals Decompression exercises 5" hold x 8 each Head press Shoulder press Leg lengthener Leg press  12/11/22 physical therapy evaluation and HEP instruction    PATIENT EDUCATION:  Education details: updated HEP Education method: Explanation, Demonstration, and Handouts Education comprehension: verbalized understanding, returned demonstration, verbal cues required, and tactile cues required HOME EXERCISE PROGRAM: Access Code: [email or web address] URL: [email or web address]  12/27/22 - Clamshell with Resistance  - 1-2 x daily - 7 x weekly - 2 sets - 10 reps - Supine Bridge with Resistance Band  - 1-2 x daily - 7 x weekly - 2 sets - 10 reps  12/25/22 - Lower Trunk Rotations  - 1-2 x daily - 7 x weekly  12/20/22 - Supine Bridge  - 1-2 x daily - 7 x weekly - 2 sets - 10 reps - Supine Hip Adduction Isometric with Ball  - 1-2 x daily - 7 x weekly - 2 sets - 10 reps - 3 hold - Hooklying Isometric Hip Abduction with Belt  - 1-2 x daily - 7 x weekly - 1-2 sets - 10 reps - 3 hold - Seated Hamstring Stretch  - 1-2 x daily - 7 x weekly - 3 reps - 30 hold - Supine Sciatic Nerve Mobilization With Leg on Pillow  - 1-2 x daily - 7 x weekly  12/17/2022 decompression exercises; hold all other HEP for now  Date: 12/11/2022 Prepared by: [facility]  Exercises - Static Prone on Elbows  - 4 x daily - 7 x weekly - 1 sets - 1 reps - 2 minutes hold - Prone Press Up  - 4 x daily - 7 x weekly - 1 sets - 10  reps - Seated Thoracic Lumbar Extension  - 4 x daily - 7 x weekly - 1 sets - 10 reps - Doorway Pec Stretch at 90 Degrees Abduction  - 2 x daily - 7 x weekly - 1 sets - 5 reps - 20 sec hold  ASSESSMENT:  CLINICAL IMPRESSION: B medial malleoli are still level in supine so hip distraction was not done today. Interventions today were geared towards hip strengthening, core strengthening, LE flexibility, and neural mobility. Tolerated all activities without worsening of symptoms.  No shooting pain on the legs were reported. Demonstrated appropriate mild to moderate level of fatigue. Rest periods provided and pacing of activities was slow. Required slight to mild amount of cueing to ensure correct execution of activity especially on the hip hinges where patient has a tendency to slightly round his back due to weak core. To date, skilled PT is required to address the impairments and improve function.    Eval:Patient is a 42 y.o. male who was seen today for physical therapy evaluation and treatment for M41.9 (ICD-10-CM) - Scoliosis, unspecified. Patient presents to physical therapy with complaint of back pain. Patient demonstrates muscle weakness, reduced ROM, and fascial restrictions which are likely contributing to symptoms of pain and are negatively impacting patient ability to perform ADLs and functional mobility tasks. Patient will benefit from skilled physical therapy services to address these deficits to reduce pain and improve level of function with ADLs and functional mobility tasks.   OBJECTIVE IMPAIRMENTS: Abnormal gait, decreased activity tolerance, decreased knowledge of condition, decreased mobility, difficulty walking, decreased ROM, decreased strength, hypomobility, increased fascial restrictions, impaired perceived functional ability, impaired flexibility, postural dysfunction, and pain.   ACTIVITY LIMITATIONS: carrying, lifting, bending, standing, squatting, sleeping, reach over head,  locomotion level, and caring for others  PARTICIPATION LIMITATIONS: meal prep, cleaning, laundry, shopping, community activity, occupation, and yard work  PERSONAL FACTORS: Profession are also affecting patient's functional outcome.   REHAB POTENTIAL: Good  CLINICAL DECISION MAKING: Stable/uncomplicated  EVALUATION COMPLEXITY: Low   GOALS: Goals reviewed with patient? No  SHORT TERM GOALS: Target date: 12/25/2022 patient will be independent with initial HEP  Baseline: Goal status: IN PROGRESS  2.  Patient will self report 30% improvement to improve tolerance for functional activity   Baseline:  Goal status: IN PROGRESS   LONG TERM GOALS: Target date: 01/08/2023  Patient will be independent in self management strategies to improve quality of life and functional outcomes.   Baseline:  Goal status: IN PROGRESS  2.  Patient will improve modified Oswestry score by 10 points to demonstrate improved functional mobility Baseline: 15/50 Goal status: IN PROGRESS  3.  Patient will increase bilateral leg MMTs to 5/5 without pain to promote return to ambulation community distances with minimal deviation.  Baseline: see above Goal status: IN PROGRESS  4.  Patient will self report 50% improvement to improve tolerance for functional activity  Baseline:  Goal status: IN PROGRESS   PLAN:  PT FREQUENCY: 2x/week  PT DURATION: 4 weeks  PLANNED INTERVENTIONS: Therapeutic exercises, Therapeutic activity, Neuromuscular re-education, Balance training, Gait training, Patient/Family education, Joint manipulation, Joint mobilization, Stair training, Orthotic/Fit training, DME instructions, Aquatic Therapy, Dry Needling, Electrical stimulation, Spinal manipulation, Spinal mobilization, Cryotherapy, Moist heat, Compression bandaging, scar mobilization, Splintting, Taping, Traction, Ultrasound, Ionotophoresis 34m/ml Dexamethasone, and Manual therapy   PLAN FOR NEXT SESSION:  Continue  POC and may progress as tolerated with emphasis on postural and core strengthening; lower extremity strengthening; body mechanics training; decompression exercises  Joliana Claflin L. Criston Chancellor, PT, DPT, OCS Board-Certified Clinical Specialist in [place] ([place]: [id number] 2:34 PM, 12/27/22

## 2022-12-27 NOTE — Progress Notes (Signed)
[facility] [address], [place] [address]   Clinic Day:  12/27/2022  Referring physician: Lindell Spar, MD  Patient Care Team: Lindell Spar, MD as PCP - General (Internal Medicine) Leonie Man, MD as PCP - Cardiology (Cardiology) Shea Evans Norva Riffle, LCSW as [place] [facility] (Licensed Clinical Social Worker) Derek Jack, MD as Medical Oncologist (Hematology)   ASSESSMENT & PLAN:   Assessment:  1.  Erythrocytosis: - CBC (12/23/2022): Hb-18.2, HCT-53.8, RBC-6.06 - CBC (12/02/2022): Hb-18.1, HCT-52.8, RBC-6.00 - Reportedly had a syncopal episode last year and was diagnosed with a tickborne illness and was treated.  He was also seen by infectious disease. - CTAP (02/23/2021): Spleen normal. - Denies any aquagenic pruritus or vasomotor symptoms.  No prior history of thrombosis.  He is not on testosterone supplements.  No B symptoms. - He has obstructive sleep apnea, has CPAP machine but does not use on a regular basis.  He uses 1-2 times per month. - Had generalized itching seen by dermatology and given a shot which resolved. - Reports now getting tremors of the whole body at nighttime.  2.  Social/family history: - He is seen with his mother today.  He has lawn care and landscaping business.  He is a non-smoker.  Exposure to Roundup present. - No family history of polycythemia vera.  Mother had cervical cancer.  Maternal grandmother and maternal grandfather had cancers.  Father had cancer.   Plan:  1.  Erythrocytosis: - We discussed causes of erythrocytosis including reactive and clonal causes. - Sleep apnea can also contribute to it as he is not using CPAP machine. - We will repeat his CBC today.  Will also check EPO level and JAK2 V617F mutation.  RTC 4 weeks for follow-up to discuss results and further plan.   Orders Placed This Encounter  Procedures   CBC with Differential    Standing Status:   Future     Number of Occurrences:   1    Standing Expiration Date:   12/27/2023   Lactate dehydrogenase    Standing Status:   Future    Number of Occurrences:   1    Standing Expiration Date:   12/27/2023   Erythropoietin    Standing Status:   Future    Number of Occurrences:   1    Standing Expiration Date:   12/27/2023   JAK2 V617F rfx CALR/MPL/E12-15    Standing Status:   Future    Number of Occurrences:   1    Standing Expiration Date:   12/28/2023      Kaleen Odea as a scribe for Derek Jack, MD.,have documented all relevant documentation on the behalf of Derek Jack, MD,as directed by  Derek Jack, MD while in the presence of Derek Jack, MD.   I, Derek Jack MD, have reviewed the above documentation for accuracy and completeness, and I agree with the above.   Doyce Loose   2/16/202411:25 AM  CHIEF COMPLAINT/PURPOSE OF CONSULT:   Diagnosis: Erythrocytosis   Current Therapy:  Working up  HISTORY OF PRESENT ILLNESS:   Graeme is a 42 y.o. male presenting to clinic today for evaluation of Erythrocytosis  at the request of Dr. Ihor Dow.  He was diagnosed with a tickborne disease in 2023 and was treated for it.   Today, he states that he is doing well overall. His appetite level is at 100%. His energy level is at 10%. He is  here today with his mother.  He rarely uses CPAP machine for sleep apnea. He previously had a skin rash. The rash and generalized itching resolved after a shot was given to him by dermatology.  He denies taking blood pressure medication.   He denies any history of smoking.   His mom had cervical cancer. His grandma and grandfather had cancer. He denies family history of blood clots.   He currently has his own Theme park manager. He previous had 80 yards before he got sick and since decreased. He works around a [employer] bre[employer]. One of the chemical names is roundup.   PAST MEDICAL HISTORY:    Past Medical History: Past Medical History:  Diagnosis Date   Acute renal injury ([place]) 06/28/2017   Anxiety    Aspiration pneumonia of right upper lobe due to gastric secretions ([place])    Diverticulitis    GERD (gastroesophageal reflux disease) 01/08/2018   Seasonal allergies    Syncope 06/26/2021    Surgical History: Past Surgical History:  Procedure Laterality Date   BIOPSY  03/18/2021   Procedure: BIOPSY;  Surgeon: Harvel Quale, MD;  [facility];  Service: Gastroenterology;;  esophageal at [facility]   BIOPSY  05/23/2021   Procedure: BIOPSY;  Surgeon: Harvel Quale, MD;  [facility];  Service: Gastroenterology;;   CARDIOPULMONARY EXERCISE TEST (CPX)  01/09/2022   Interpretation limited by submaximal effort.  Severe functional impairment when compared to max sedentary norms.  No cardiopulmonary limitations.  Noted tremor and generalized weakness that lead to premature exercise termination-consider neuromuscular evaluation.   COLONOSCOPY N/A 09/22/2017   Procedure: COLONOSCOPY;  Surgeon: Rogene Houston, MD;  [facility];  Service: Endoscopy;  Laterality: N/A;  9:55   COLONOSCOPY WITH PROPOFOL N/A 05/23/2021   Procedure: COLONOSCOPY WITH PROPOFOL;  Surgeon: Harvel Quale, MD;  [facility];  Service: Gastroenterology;  Laterality: N/A;  7:30   ESOPHAGOGASTRODUODENOSCOPY (EGD) WITH PROPOFOL N/A 03/18/2021   Procedure: ESOPHAGOGASTRODUODENOSCOPY (EGD) WITH PROPOFOL;  Surgeon: Harvel Quale, MD;  [facility];  Service: Gastroenterology;  Laterality: N/A;   ESOPHAGOGASTRODUODENOSCOPY (EGD) WITH PROPOFOL N/A 05/23/2021   Procedure: ESOPHAGOGASTRODUODENOSCOPY (EGD) WITH PROPOFOL;  Surgeon: Harvel Quale, MD;  [facility];  Service: Gastroenterology;  Laterality: N/A;   POLYPECTOMY  09/22/2017   Procedure: POLYPECTOMY;  Surgeon: Rogene Houston, MD;  [facility];  Service: Endoscopy;;   RIGHT/LEFT HEART CATH AND CORONARY ANGIOGRAPHY N/A 12/12/2021   Procedure: RIGHT/LEFT HEART CATH AND CORONARY ANGIOGRAPHY;  Surgeon: Jolaine Artist, MD;  Location: [facility];  Service: Cardiovascular:: Normal Coronaries & Hemodynamics.  EF 50-55%. RA = 4 mmHg, RV = 23/4; PA = 22/6 (14) & PCW = 7; Ao sat = 95%; PA sat = 76%, 76%& SVC sat = 74%Fick cardiac output/index = 7.1/3.0; PVR = 1.0 WU   TRANSTHORACIC ECHOCARDIOGRAM  02/24/2021   EF 60 to 65%.  Normal LV function.  Normal valves.  Mild RV dilation with normal function.   ZIO PATCH EVENT MONITOR  01/2022   Sinus rhythm with heart rate range 24 to 159 bpm, 1  AVB and Wenckebach block noted along with rare PACs and PVCs.  No arrhythmias.    Social History: Social History   Socioeconomic History   Marital status: Single    Spouse name: Not on file   Number of children: Not on file   Years of education: Not on  file   Highest education level: Not on file  Occupational History   Not on file  Tobacco Use   Smoking status: Never    Passive exposure: Past   Smokeless tobacco: Never  Vaping Use   Vaping Use: Never used  Substance and Sexual Activity   Alcohol use: Yes    Comment: occ   Drug use: No   Sexual activity: Not on file  Other Topics Concern   Not on file  Social History Narrative   He currently has his own lawn care landscaping service. He previous had 80 yards before he got sick and since decreased. He works around a Technical brewer. One of the chemical names is roundup.    Social Determinants of Health   Financial Resource Strain: Not on file  Food Insecurity: No Food Insecurity (12/27/2022)   Hunger Vital Sign    Worried About Running Out of Food in the Last Year: Never true    Ran Out of Food in the Last Year: Never true  Transportation Needs: No Transportation Needs (12/27/2022)   PRAPARE - Hydrologist (Medical): No    Lack of  Transportation (Non-Medical): No  Physical Activity: Insufficiently Active (03/29/2022)   Exercise Vital Sign    Days of Exercise per Week: 2 days    Minutes of Exercise per Session: 10 min  Stress: Stress Concern Present (05/07/2022)   [place]    Feeling of Stress : To some extent  Social Connections: Not on file  Intimate Partner Violence: Not At Risk (12/27/2022)   Humiliation, Afraid, Rape, and Kick questionnaire    Fear of Current or Ex-Partner: No    Emotionally Abused: No    Physically Abused: No    Sexually Abused: No    Family History: Family History  Problem Relation Age of Onset   Healthy Mother    Cervical cancer Mother    Cancer Father    Colon cancer Maternal Grandmother    Colon cancer Maternal Grandfather     Current Medications:  Current Outpatient Medications:    albuterol (VENTOLIN HFA) 108 (90 Base) MCG/ACT inhaler, Inhale 2 puffs into the lungs every 6 (six) hours as needed for shortness of breath., Disp: 8 g, Rfl: 1   aspirin EC 81 MG tablet, Take 81 mg by mouth daily. Swallow whole., Disp: , Rfl:    b complex vitamins capsule, Take 1 capsule by mouth daily. With D3 gummie, Disp: , Rfl:    betamethasone dipropionate 0.05 % lotion, Apply 1-2 times daily to affected itchy areas at scalp. Avoid applying to face, groin, and axilla. Use as directed. Long-term use can cause thinning of the skin., Disp: 60 mL, Rfl: 2   cetirizine (ZYRTEC) 10 MG tablet, Take 10 mg by mouth daily., Disp: , Rfl:    clindamycin (CLEOCIN T) 1 % external solution, Apply topically to acne bumps at aa's qd, Disp: 30 mL, Rfl: 3   clobetasol (TEMOVATE) 0.05 % external solution, Apply 1 application. topically 2 (two) times daily. Use as directed.  Mix with cerave cream. Use for up to 4 weeks. Avoid applying to face, groin, and axilla. Use as directed., Disp: 50 mL, Rfl: 1   clobetasol cream (TEMOVATE) 0.05 %, Apply small amount  daily before bed to right ankle or other areas of body as needed for itchy rash Avoid applying to face, groin, and axilla Use as directed., Disp: 30 g, Rfl:  0   cyclobenzaprine (FLEXERIL) 5 MG tablet, Take 1 tablet (5 mg total) by mouth at bedtime as needed for muscle spasms., Disp: 30 tablet, Rfl: 1   diphenhydrAMINE-zinc acetate (BENADRYL EXTRA STRENGTH) cream, Apply 1 application topically 3 (three) times daily as needed for itching., Disp: 28.4 g, Rfl: 0   docusate sodium (COLACE) 100 MG capsule, Take 100 mg by mouth 2 (two) times daily., Disp: , Rfl:    DULoxetine (CYMBALTA) 30 MG capsule, Take 60 mg by mouth daily., Disp: , Rfl:    Dupilumab (DUPIXENT) 300 MG/2ML SOPN, Inject 300 mg into the skin every 14 (fourteen) days. Starting at day 15 for maintenance., Disp: 4 mL, Rfl: 5   Fluocinolone Acetonide 0.01 % OIL, Use 1 - 2 drop qd/bid prn for scale at ears, Disp: 20 mL, Rfl: 2   fluocinonide (LIDEX) 0.05 % external solution, Apply once or twice daily to affected areas on scalp up to 2 weeks as needed for itching. Avoid applying to face, groin, and axilla., Disp: 60 mL, Rfl: 2   FLUoxetine (PROZAC) 20 MG capsule, Take 1 capsule (20 mg total) by mouth daily., Disp: 90 capsule, Rfl: 1   fluticasone (FLONASE ALLERGY RELIEF) 50 MCG/ACT nasal spray, Place 2 sprays into both nostrils daily as needed for allergies or rhinitis. (Patient taking differently: Place 2 sprays into both nostrils daily.), Disp: 9.9 mL, Rfl: 2   fluticasone-salmeterol (ADVAIR) 250-50 MCG/ACT AEPB, as needed., Disp: , Rfl:    gabapentin (NEURONTIN) 300 MG capsule, Take 300 mg by mouth 3 (three) times daily., Disp: , Rfl:    hydrOXYzine (VISTARIL) 25 MG capsule, Take 1 capsule (25 mg total) by mouth every 8 (eight) hours as needed. Take before bed as needed for itchy. Can cause drowsiness., Disp: 60 capsule, Rfl: 2   ketoconazole (NIZORAL) 2 % shampoo, Apply 1 Application topically daily as needed for irritation., Disp: 120 mL, Rfl:  11   lubiprostone (AMITIZA) 8 MCG capsule, TAKE 1 CAPSULE BY MOUTH TWICE DAILY WITH A MEAL, Disp: 180 capsule, Rfl: 0   montelukast (SINGULAIR) 10 MG tablet, TAKE 1 TABLET BY MOUTH AT BEDTIME, Disp: 30 tablet, Rfl: 1   naproxen (NAPROSYN) 500 MG tablet, Take 500 mg by mouth 2 (two) times daily., Disp: , Rfl:    omega-3 acid ethyl esters (LOVAZA) 1 g capsule, Take 2 capsules (2 g total) by mouth 2 (two) times daily., Disp: 120 capsule, Rfl: 3   omeprazole (PRILOSEC) 40 MG capsule, Take 1 capsule (40 mg total) by mouth 2 (two) times daily., Disp: 180 capsule, Rfl: 3   Potassium 99 MG TABS, Take 1 tablet by mouth daily., Disp: , Rfl:    propranolol (INDERAL) 20 MG tablet, Take one tablet 20 mg  twice daily  may take an  additional dose of 20 mg  as needed for palpitations, Disp: 190 tablet, Rfl: 3   senna (SENOKOT) 8.6 MG tablet, Take by mouth., Disp: , Rfl:    tacrolimus (PROTOPIC) 0.1 % ointment, APPLY TOPICALLY TO AFFECTED AREAS OF BODY FOR RASH DAILY OR TWICE DAILY AS NEEDED, Disp: 30 g, Rfl: 0   ondansetron (ZOFRAN) 4 MG tablet, Take 1 tablet (4 mg total) by mouth every 6 (six) hours as needed for nausea. (Patient not taking: Reported on 12/27/2022), Disp: 20 tablet, Rfl: 0   Allergies: Allergies  Allergen Reactions   Gramineae Pollens Itching   Claritin [Loratadine] Other (See Comments)    Heart race   Loratadine-Pseudoephedrine Er Palpitations   Pollen  Extract Other (See Comments)    Seasonal allergies    REVIEW OF SYSTEMS:   Review of Systems  Constitutional:  Negative for chills, fatigue and fever.  HENT:   Negative for lump/mass, mouth sores, nosebleeds, sore throat and trouble swallowing.   Eyes:  Negative for eye problems.  Respiratory:  Positive for shortness of breath (With exertion). Negative for cough.   Cardiovascular:  Positive for palpitations. Negative for chest pain and leg swelling.  Gastrointestinal:  Positive for constipation. Negative for abdominal pain, diarrhea,  nausea and vomiting.  Genitourinary:  Negative for bladder incontinence, difficulty urinating, dysuria, frequency, hematuria and nocturia.   Musculoskeletal:  Negative for arthralgias, back pain, flank pain, myalgias and neck pain.  Skin:  Negative for itching and rash.  Neurological:  Positive for headaches. Negative for dizziness and numbness.  Hematological:  Does not bruise/bleed easily.  Psychiatric/Behavioral:  Positive for sleep disturbance. Negative for depression and suicidal ideas. The patient is not nervous/anxious.   All other systems reviewed and are negative.    VITALS:   Blood pressure 122/87, pulse 84, temperature 97.6 F (36.4 C), temperature source Oral, resp. rate 16, height 6' 3"$  (1.905 m), weight 263 lb (119.3 kg), SpO2 98 %.  Wt Readings from Last 3 Encounters:  12/27/22 263 lb (119.3 kg)  12/23/22 265 lb 1.9 oz (120.3 kg)  12/02/22 264 lb 6.4 oz (119.9 kg)    Body mass index is 32.87 kg/m.   PHYSICAL EXAM:   Physical Exam Vitals and nursing note reviewed. Exam conducted with a chaperone present.  Constitutional:      Appearance: Normal appearance.  Cardiovascular:     Rate and Rhythm: Normal rate and regular rhythm.     Pulses: Normal pulses.     Heart sounds: Normal heart sounds.  Pulmonary:     Effort: Pulmonary effort is normal.     Breath sounds: Normal breath sounds.  Abdominal:     Palpations: Abdomen is soft. There is no hepatomegaly, splenomegaly or mass.     Tenderness: There is no abdominal tenderness.  Musculoskeletal:     Right lower leg: No edema.     Left lower leg: No edema.  Lymphadenopathy:     Cervical: No cervical adenopathy.     Right cervical: No superficial, deep or posterior cervical adenopathy.    Left cervical: No superficial, deep or posterior cervical adenopathy.     Upper Body:     Right upper body: No supraclavicular or axillary adenopathy.     Left upper body: No supraclavicular or axillary adenopathy.   Neurological:     General: No focal deficit present.     Mental Status: He is alert and oriented to person, place, and time.  Psychiatric:        Mood and Affect: Mood normal.        Behavior: Behavior normal.     LABS:      Latest Ref Rng & Units 12/23/2022   11:27 AM 12/02/2022    8:59 AM 12/27/2021    2:43 PM  CBC  WBC 3.4 - 10.8 x10E3/uL 8.6  9.2  8.2   Hemoglobin 13.0 - 17.7 g/dL 18.2  18.1  16.7   Hematocrit 37.5 - 51.0 % 53.8  52.5  49.8   Platelets 150 - 450 x10E3/uL 285  298  257       Latest Ref Rng & Units 12/02/2022    8:59 AM 12/27/2021    2:43 PM 12/12/2021  8:56 AM  CMP  Glucose 70 - 99 mg/dL 108  83    BUN 6 - 24 mg/dL 16  19    Creatinine 0.76 - 1.27 mg/dL 1.75  1.31    Sodium 134 - 144 mmol/L 139  134  142   Potassium 3.5 - 5.2 mmol/L 5.3  3.8  3.4   Chloride 96 - 106 mmol/L 99  104    CO2 20 - 29 mmol/L 21  20    Calcium 8.7 - 10.2 mg/dL 10.4  9.2    Total Protein 6.0 - 8.5 g/dL 7.4     Total Bilirubin 0.0 - 1.2 mg/dL 0.6     Alkaline Phos 44 - 121 IU/L 76     AST 0 - 40 IU/L 34     ALT 0 - 44 IU/L 49        No results found for: "CEA1", "CEA" / No results found for: "CEA1", "CEA" No results found for: "PSA1" No results found for: "[id number]" No results found for: "CAN125"  No results found for: "TOTALPROTELP", "ALBUMINELP", "A1GS", "A2GS", "BETS", "BETA2SER", "GAMS", "MSPIKE", "SPEI" Lab Results  Component Value Date   TIBC 371 03/07/2022   FERRITIN 133 03/07/2022   IRONPCTSAT 33 03/07/2022   No results found for: "LDH"   STUDIES:   DG Chest 2 View  Result Date: 12/23/2022 CLINICAL DATA:  Shortness of breath with exertion EXAM: CHEST - 2 VIEW COMPARISON:  Chest radiograph 12/27/2021 FINDINGS: Stable cardiac and mediastinal contours. Moderate-sized hiatal hernia. No large area pulmonary consolidation. No pleural effusion or pneumothorax. Thoracic spine degenerative changes. Bifid anterior right fifth rib. IMPRESSION: No active cardiopulmonary  disease. Electronically Signed   By: Lovey Newcomer M.D.   On: 12/23/2022 16:56

## 2022-12-27 NOTE — Patient Instructions (Addendum)
[facility]  Discharge Instructions  You were seen and examined today by Dr. Delton Coombes. Dr. Delton Coombes is a hematologist, meaning that he specializes in blood abnormalities. Dr. Delton Coombes discussed your past medical history, family history of cancers/blood conditions and the events that led to you being here today.  You were referred to Dr. Delton Coombes due to elevated red blood cells.  Dr. Delton Coombes has recommended additional labs today for further evaluation.  Follow-up as scheduled.  Thank you for choosing [facility] to provide your oncology and hematology care.   To afford each patient quality time with our provider, please arrive at least 15 minutes before your scheduled appointment time. You may need to reschedule your appointment if you arrive late (10 or more minutes). Arriving late affects you and other patients whose appointments are after yours.  Also, if you miss three or more appointments without notifying the office, you may be dismissed from the clinic at the provider's discretion.    Again, thank you for choosing [facility].  Our hope is that these requests will decrease the amount of time that you wait before being seen by our physicians.   If you have a lab appointment with the [facility] please come in thru the Main Entrance and check in at the main information desk.           _____________________________________________________________  Should you have questions after your visit to [facility], please contact our office at [phone] and follow the prompts.  Our office hours are 8:00 a.m. to 4:30 p.m. Monday - Thursday and 8:00 a.m. to 2:30 p.m. Friday.  Please note that voicemails left after 4:00 p.m. may not be returned until the following business day.  We are closed weekends and all major holidays.  You do have access to a nurse 24-7, just call the main number to the clinic  [phone] and do not press any options, hold on the line and a nurse will answer the phone.    For prescription refill requests, have your pharmacy contact our office and allow 72 hours.    Masks are optional in the cancer centers. If you would like for your care team to wear a mask while they are taking care of you, please let them know. You may have one support person who is at least 42 years old accompany you for your appointments.

## 2022-12-28 LAB — ERYTHROPOIETIN: Erythropoietin: 11.6 m[IU]/mL (ref 2.6–18.5)

## 2022-12-30 ENCOUNTER — Other Ambulatory Visit: Payer: Self-pay | Admitting: Family Medicine

## 2022-12-30 ENCOUNTER — Ambulatory Visit (INDEPENDENT_AMBULATORY_CARE_PROVIDER_SITE_OTHER): Payer: 59

## 2022-12-30 ENCOUNTER — Encounter: Payer: Self-pay | Admitting: Internal Medicine

## 2022-12-30 DIAGNOSIS — L209 Atopic dermatitis, unspecified: Secondary | ICD-10-CM | POA: Diagnosis not present

## 2022-12-30 DIAGNOSIS — E291 Testicular hypofunction: Secondary | ICD-10-CM

## 2022-12-30 MED ORDER — DUPILUMAB 300 MG/2ML ~~LOC~~ SOSY
300.0000 mg | PREFILLED_SYRINGE | Freq: Once | SUBCUTANEOUS | Status: AC
  Administered 2022-12-30: 300 mg via SUBCUTANEOUS

## 2022-12-30 NOTE — Progress Notes (Signed)
Patient here today for 2 week Dupixent injection for Severe Atopic Dermatitis.  Dupixent 365m injected into patients right upper arm. Patient tolerated procedure well.   Patient already scheduled to come back and see Dr .SNicole Kindredin 2 weeks for next injection and followup.  LPO:[id number]: 02/2025 NLE:[id number] ADicie BeamRMA

## 2022-12-31 ENCOUNTER — Ambulatory Visit (HOSPITAL_COMMUNITY): Payer: 59

## 2022-12-31 DIAGNOSIS — M545 Low back pain, unspecified: Secondary | ICD-10-CM

## 2022-12-31 DIAGNOSIS — D751 Secondary polycythemia: Secondary | ICD-10-CM | POA: Diagnosis not present

## 2022-12-31 DIAGNOSIS — G629 Polyneuropathy, unspecified: Secondary | ICD-10-CM | POA: Diagnosis not present

## 2022-12-31 DIAGNOSIS — M546 Pain in thoracic spine: Secondary | ICD-10-CM

## 2022-12-31 NOTE — Therapy (Signed)
OUTPATIENT PHYSICAL THERAPY LUMBAR AND THORACIC TREATMENT   Patient Name: FLAY RIBERA MRN: [id number] DOB:1981/08/10, 42 y.o., male Today's Date: 12/31/2022  END OF SESSION:   12/27/22 1517  PT Visits / Re-Eval  Visit Number 5  Number of Visits 8  Date for PT Re-Evaluation 01/08/23  Authorization  Authorization Type [employer]; no VL and no auth  Progress Note Due on Visit 8  PT Time Calculation  PT Start Time 1435  PT Stop Time 1515  PT Time Calculation (min) 40 min  PT - End of Session  Activity Tolerance Patient tolerated treatment well    PT End of Session - 12/31/22 1433     Visit Number 6    Number of Visits 8    Date for PT Re-Evaluation 01/08/23    Authorization Type [employer]; no VL and no auth    Progress Note Due on Visit 8    PT Start Time [id number]    PT Stop Time 1515    PT Time Calculation (min) 42 min             Past Medical History:  Diagnosis Date   Acute renal injury ([place]) 06/28/2017   Anxiety    Aspiration pneumonia of right upper lobe due to gastric secretions ([place])    Diverticulitis    GERD (gastroesophageal reflux disease) 01/08/2018   Seasonal allergies    Syncope 06/26/2021   Past Surgical History:  Procedure Laterality Date   BIOPSY  03/18/2021   Procedure: BIOPSY;  Surgeon: Harvel Quale, MD;  [facility];  Service: Gastroenterology;;  esophageal at [facility]   BIOPSY  05/23/2021   Procedure: BIOPSY;  Surgeon: Harvel Quale, MD;  [facility];  Service: Gastroenterology;;   CARDIOPULMONARY EXERCISE TEST (CPX)  01/09/2022   Interpretation limited by submaximal effort.  Severe functional impairment when compared to max sedentary norms.  No cardiopulmonary limitations.  Noted tremor and generalized weakness that lead to premature exercise termination-consider neuromuscular evaluation.   COLONOSCOPY N/A 09/22/2017   Procedure: COLONOSCOPY;  Surgeon: Rogene Houston, MD;  [facility]  [facility];  Service: Endoscopy;  Laterality: N/A;  9:55   COLONOSCOPY WITH PROPOFOL N/A 05/23/2021   Procedure: COLONOSCOPY WITH PROPOFOL;  Surgeon: Harvel Quale, MD;  [facility];  Service: Gastroenterology;  Laterality: N/A;  7:30   ESOPHAGOGASTRODUODENOSCOPY (EGD) WITH PROPOFOL N/A 03/18/2021   Procedure: ESOPHAGOGASTRODUODENOSCOPY (EGD) WITH PROPOFOL;  Surgeon: Harvel Quale, MD;  [facility];  Service: Gastroenterology;  Laterality: N/A;   ESOPHAGOGASTRODUODENOSCOPY (EGD) WITH PROPOFOL N/A 05/23/2021   Procedure: ESOPHAGOGASTRODUODENOSCOPY (EGD) WITH PROPOFOL;  Surgeon: Harvel Quale, MD;  [facility];  Service: Gastroenterology;  Laterality: N/A;   POLYPECTOMY  09/22/2017   Procedure: POLYPECTOMY;  Surgeon: Rogene Houston, MD;  [facility];  Service: Endoscopy;;   RIGHT/LEFT HEART CATH AND CORONARY ANGIOGRAPHY N/A 12/12/2021   Procedure: RIGHT/LEFT HEART CATH AND CORONARY ANGIOGRAPHY;  Surgeon: Jolaine Artist, MD;  Location: [facility];  Service: Cardiovascular:: Normal Coronaries & Hemodynamics.  EF 50-55%. RA = 4 mmHg, RV = 23/4; PA = 22/6 (14) & PCW = 7; Ao sat = 95%; PA sat = 76%, 76%& SVC sat = 74%Fick cardiac output/index = 7.1/3.0; PVR = 1.0 WU   TRANSTHORACIC ECHOCARDIOGRAM  02/24/2021   EF 60 to 65%.  Normal LV function.  Normal valves.  Mild RV dilation with normal function.  ZIO PATCH EVENT MONITOR  01/2022   Sinus rhythm with heart rate range 24 to 159 bpm, 1  AVB and Wenckebach block noted along with rare PACs and PVCs.  No arrhythmias.   Patient Active Problem List   Diagnosis Date Noted   Erythrocytosis 12/03/2022   Encounter for general adult medical examination with abnormal findings 12/02/2022   OSA (obstructive sleep apnea) 12/02/2022   Urinary retention 11/05/2022   Cervical spinal stenosis 07/29/2022   DDD (degenerative disc disease), lumbar 07/29/2022   NASH  (nonalcoholic steatohepatitis) 06/10/2022   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/2022   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   PSVT (paroxysmal supraventricular tachycardia) 03/25/2022   Itching 03/07/2022   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   Bloating 03/07/2022   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   [facility] spotted fever 12/31/2021   Encounter for examination following treatment at hospital 12/31/2021   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   Chronic sinusitis 12/04/2021   Mixed hyperlipidemia 11/30/2021   Esophageal hiatal hernia 11/22/2021   Irritable bowel syndrome with constipation 11/22/2021   SOB (shortness of breath) on exertion 06/28/2021   Anxiety 06/26/2021   Eczema 06/26/2021   Barrett's esophagus 04/11/2021   History of colonic polyps 04/11/2021   Focal neurological deficit 02/23/2021   GERD (gastroesophageal reflux disease) 01/08/2018    PCP: Ihor Dow, MD  REFERRING PROVIDER: Judith Part, MD  REFERRING DIAG: M41.9 (ICD-10-CM) - Scoliosis, unspecified  THERAPY DIAG:  Low back pain, unspecified back pain laterality, unspecified chronicity, unspecified whether sciatica present  Pain in thoracic spine  Rationale for Evaluation and Treatment: Rehabilitation  ONSET DATE: chronic; worse over the last year  SUBJECTIVE:   SUBJECTIVE STATEMENT: Back still bothering me some at night but not as badly; less pain with exercises.  Not working much right now so that may be why less pain.  Less right leg pain; able to walk around and shop some without pain.  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  PCP referred to [facility].  MRI done of the thoracic spine; referred to therapy; he also goes to chiropractor some; Dr. Bethann Goo.  Reports weak feeling in legs  PERTINENT HISTORY: Last year [facility] spotted fever with subsequent  tremors and passing out Large hiatal hernia. See above PAIN:   Are you having pain? No  PRECAUTIONS: None  WEIGHT BEARING RESTRICTIONS: No  FALLS:  Has patient fallen in last 6 months? No  LIVING ENVIRONMENT: Lives with: lives with their family Lives in: House/apartment Stairs: Yes: External: 6 steps; on right going up, on left going up, and can reach both Has following equipment at home: None  OCCUPATION: Tree surgeon maintenance  PLOF: Independent  PATIENT GOALS: help my back feel better  NEXT MD VISIT: Dr. Jabier Mutton (neurologist)  OBJECTIVE:   DIAGNOSTIC FINDINGS: IMPRESSION: 1. Mild degenerative changes of the thoracic spine without high-grade spinal canal or neural foraminal stenosis at any level. 2. Exaggerated thoracic kyphosis related to minimal chronic compression fractures, as detailed above. 3. Large hiatal hernia.  IMPRESSION: 1. Moderate right foraminal encroachment due to spurring and disc protrusion. Slight cord flattening on the right due to spurring. 2. Otherwise no significant spinal stenosis or cord compression.     Electronically Signed   By: Pedro Earls M.D.   On: 09/06/2022 14:22  PATIENT SURVEYS:  Modified Oswestry 15/50 30%   COGNITION: Overall cognitive status: Within functional limits for tasks  assessed     SENSATION: Reports occasional numbness right thigh   POSTURE: rounded shoulders, forward head, and increased thoracic kyphosis slight right side rib hump; significant kyphosis thoracic spine  PALPATION: General tenderness of the back on palpation  LOWER EXTREMITY MMT:  MMT Right eval Left eval  Hip flexion 4+* 5  Hip extension 4 4+  Hip abduction    Hip adduction    Hip internal rotation    Hip external rotation    Knee flexion 5 4+  Knee extension 5* 5  Ankle dorsiflexion 5 5  Ankle plantarflexion    Ankle inversion    Ankle eversion     (Blank rows = not tested)   LUMBAR ROM:   Active  A/PROM  eval  Flexion 85% available *  Extension 50%  available*  Right lateral flexion To knee  Left lateral flexion To just above knee  Right rotation   Left rotation    (Blank rows = not tested)   FUNCTIONAL TESTS:  5 times sit to stand: 18.80 Sec  TODAY'S TREATMENT:                                                                                                                              DATE:  12/31/22 Supine: LTR x 2' Hooklying hip adduction with ball 5" hold x 10 Hip abduction with GTB with bridge 3" hold x 10  Sidelying: Clam GTB 2 x 10 each way  Prone: Press ups 10" x 10  Seated: Abdominal isometrics 5" hold 2 x 10 Hamstring stretch with strap 3 x 20"   12/27/22 Hooklying hip adduction x 3" x 10 x 2 Bridging with hip abd, GTB, 3" x 10 x 2 Sidelying clamshells, GTB x 10 x 2 on each Supine R sciatic nerve floss x 1' LTR x 1' Prone on elbows x 5" x 10 Prone press ups x 5" x 10 Seated abdominal isometrics with physioball x 3" x 10 x 2 Seated hamstring stretch x 30" x 3 Standing Hip hinges x 10  12/25/22 Hooklying hip adduction x 3" x 10 x 2 Bridging x 10 x 2 Hooklying hip abd, RTB x 3" x 10 x 2 Supine R sciatic nerve floss x 1' LTR x 1' Prone on elbows x 5" x 10 Prone press ups x 5" x 10 Seated hamstring stretch x 30" x 3  12/20/22 Grade 1 long axis distraction on L hip x 30" x 3 followed by Grade 1 inf thrust on the last rep Hooklying hip adduction x 3" x 10 x 2 Bridging x 10 x 2 Hooklying hip abd isom with a belt x 3" x 10 x 2 Supine R sciatic nerve floss with pillow x 1' Prone on elbows x 5" x 10 Prone press ups x 5" x 10 Seated hamstring stretch x 30" x 3  12/17/2022 Review of HEP and goals Decompression exercises 5" hold x 8 each Head press Shoulder  press Leg lengthener Leg press  12/11/22 physical therapy evaluation and HEP instruction    PATIENT EDUCATION:  Education details: updated HEP Education method: Explanation, Demonstration, and Handouts Education comprehension: verbalized  understanding, returned demonstration, verbal cues required, and tactile cues required HOME EXERCISE PROGRAM: Access Code: [email or web address] URL: [email or web address]  12/27/22 - Clamshell with Resistance  - 1-2 x daily - 7 x weekly - 2 sets - 10 reps - Supine Bridge with Resistance Band  - 1-2 x daily - 7 x weekly - 2 sets - 10 reps  12/25/22 - Lower Trunk Rotations  - 1-2 x daily - 7 x weekly  12/20/22 - Supine Bridge  - 1-2 x daily - 7 x weekly - 2 sets - 10 reps - Supine Hip Adduction Isometric with Ball  - 1-2 x daily - 7 x weekly - 2 sets - 10 reps - 3 hold - Hooklying Isometric Hip Abduction with Belt  - 1-2 x daily - 7 x weekly - 1-2 sets - 10 reps - 3 hold - Seated Hamstring Stretch  - 1-2 x daily - 7 x weekly - 3 reps - 30 hold - Supine Sciatic Nerve Mobilization With Leg on Pillow  - 1-2 x daily - 7 x weekly  12/17/2022 decompression exercises; hold all other HEP for now  Date: 12/11/2022 Prepared by: [facility]  Exercises - Static Prone on Elbows  - 4 x daily - 7 x weekly - 1 sets - 1 reps - 2 minutes hold - Prone Press Up  - 4 x daily - 7 x weekly - 1 sets - 10 reps - Seated Thoracic Lumbar Extension  - 4 x daily - 7 x weekly - 1 sets - 10 reps - Doorway Pec Stretch at 90 Degrees Abduction  - 2 x daily - 7 x weekly - 1 sets - 5 reps - 20 sec hold  ASSESSMENT:  CLINICAL IMPRESSION: Today's session continued to work on spinal mobility and core strengthening.  Able to hold bridge today with improved form.  Needs occasional cues for breathing and to contract abdominals before bridge.  No increased pain after exercise today.  To date, skilled PT is required to address the impairments and improve function.    Eval:Patient is a 42 y.o. male who was seen today for physical therapy evaluation and treatment for M41.9 (ICD-10-CM) - Scoliosis, unspecified. Patient presents to physical therapy with complaint of back pain. Patient demonstrates muscle weakness, reduced ROM, and  fascial restrictions which are likely contributing to symptoms of pain and are negatively impacting patient ability to perform ADLs and functional mobility tasks. Patient will benefit from skilled physical therapy services to address these deficits to reduce pain and improve level of function with ADLs and functional mobility tasks.   OBJECTIVE IMPAIRMENTS: Abnormal gait, decreased activity tolerance, decreased knowledge of condition, decreased mobility, difficulty walking, decreased ROM, decreased strength, hypomobility, increased fascial restrictions, impaired perceived functional ability, impaired flexibility, postural dysfunction, and pain.   ACTIVITY LIMITATIONS: carrying, lifting, bending, standing, squatting, sleeping, reach over head, locomotion level, and caring for others  PARTICIPATION LIMITATIONS: meal prep, cleaning, laundry, shopping, community activity, occupation, and yard work  PERSONAL FACTORS: Profession are also affecting patient's functional outcome.   REHAB POTENTIAL: Good  CLINICAL DECISION MAKING: Stable/uncomplicated  EVALUATION COMPLEXITY: Low   GOALS: Goals reviewed with patient? No  SHORT TERM GOALS: Target date: 12/25/2022 patient will be independent with initial HEP  Baseline: Goal status: IN PROGRESS  2.  Patient will  self report 30% improvement to improve tolerance for functional activity   Baseline:  Goal status: IN PROGRESS   LONG TERM GOALS: Target date: 01/08/2023  Patient will be independent in self management strategies to improve quality of life and functional outcomes.   Baseline:  Goal status: IN PROGRESS  2.  Patient will improve modified Oswestry score by 10 points to demonstrate improved functional mobility Baseline: 15/50 Goal status: IN PROGRESS  3.  Patient will increase bilateral leg MMTs to 5/5 without pain to promote return to ambulation community distances with minimal deviation.  Baseline: see above Goal status: IN  PROGRESS  4.  Patient will self report 50% improvement to improve tolerance for functional activity  Baseline:  Goal status: IN PROGRESS   PLAN:  PT FREQUENCY: 2x/week  PT DURATION: 4 weeks  PLANNED INTERVENTIONS: Therapeutic exercises, Therapeutic activity, Neuromuscular re-education, Balance training, Gait training, Patient/Family education, Joint manipulation, Joint mobilization, Stair training, Orthotic/Fit training, DME instructions, Aquatic Therapy, Dry Needling, Electrical stimulation, Spinal manipulation, Spinal mobilization, Cryotherapy, Moist heat, Compression bandaging, scar mobilization, Splintting, Taping, Traction, Ultrasound, Ionotophoresis 79m/ml Dexamethasone, and Manual therapy   PLAN FOR NEXT SESSION:  Continue POC and may progress as tolerated with emphasis on postural and core strengthening; lower extremity strengthening; body mechanics training; decompression exercises  3:15 PM, 12/31/22 Conlee Sliter Small Konica Stankowski MPT [facility] [place] #[phone]

## 2023-01-01 ENCOUNTER — Encounter: Payer: Self-pay | Admitting: Pulmonary Disease

## 2023-01-01 ENCOUNTER — Ambulatory Visit: Payer: 59 | Attending: Cardiology | Admitting: Student

## 2023-01-01 ENCOUNTER — Ambulatory Visit: Payer: 59 | Admitting: Pulmonary Disease

## 2023-01-01 VITALS — BP 118/76 | HR 67 | Ht 75.0 in | Wt 264.0 lb

## 2023-01-01 DIAGNOSIS — G4733 Obstructive sleep apnea (adult) (pediatric): Secondary | ICD-10-CM | POA: Diagnosis not present

## 2023-01-01 DIAGNOSIS — E782 Mixed hyperlipidemia: Secondary | ICD-10-CM

## 2023-01-01 LAB — TESTOSTERONE,FREE AND TOTAL
Testosterone, Free: 15 pg/mL (ref 6.8–21.5)
Testosterone: 157 ng/dL — ABNORMAL LOW (ref 264–916)

## 2023-01-01 MED ORDER — ROSUVASTATIN CALCIUM 40 MG PO TABS: 40.0000 mg | ORAL_TABLET | Freq: Every day | ORAL | 3 refills | Status: DC

## 2023-01-01 NOTE — Progress Notes (Signed)
Patient ID: Elijah Shaffer                 DOB: August 07, 1981                    MRN: [id number]      HPI: Elijah Shaffer is a 42 y.o. male patient referred to lipid clinic by [facility]. PMH is significant for  angina, OSA, GERD, NASH, IBS-C.  Patient presented today with his brother. Reports he takes Lovaza daily and tolerates it well without any side effects. He does not recall why rosuvastatin was stopped. But sure he did not experienced any side effects from it. He is in [place] so gets very busy during spring and summer but not so much in winter. He eats most meals per day outside. He does not know how to cook. He had tick bite and after that his started having lots of health issues. He feels tired all the time and not motivated to do any thing. He moved with his mother and brother from past few months so he has great support system but he eats most meals per day outside. He does not know how to cook. When he is going to physical therapy for his back pain, other than that he does not do any exercise at home. Reports his body hurts, get short of breath when over do it.    Diet:  -breakfast: everyday eats out at [employer]  -lunch: every day eat out - fast food  -dinner-  hamburger helper at home  or eats out -fast food   Exercise: none during winter months and heavy duty job during spring and summer months.   Family History: maternal GM - cancer, heart valve - had pacemaker  Maternal GF: T2DM  Social History:  Smoking: never Alcohol: once a year on his birthday Recreational Drug use: never  Labs: Lipid Panel     Component Value Date/Time   CHOL 266 (H) 12/02/2022 0859   TRIG 987 (HH) 12/02/2022 0859   HDL 31 (L) 12/02/2022 0859   CHOLHDL 8.6 (H) 12/02/2022 0859   CHOLHDL 3.6 02/24/2021 0525   VLDL 17 02/24/2021 0525   LDLCALC Comment (A) 12/02/2022 0859   LABVLDL Comment (A) 12/02/2022 0859    Past Medical History:  Diagnosis Date   Acute renal injury ([place])  06/28/2017   Anxiety    Aspiration pneumonia of right upper lobe due to gastric secretions ([facility])    Diverticulitis    GERD (gastroesophageal reflux disease) 01/08/2018   Seasonal allergies    Syncope 06/26/2021    Current Outpatient Medications on File Prior to Visit  Medication Sig Dispense Refill   albuterol (VENTOLIN HFA) 108 (90 Base) MCG/ACT inhaler Inhale 2 puffs into the lungs every 6 (six) hours as needed for shortness of breath. 8 g 1   aspirin EC 81 MG tablet Take 81 mg by mouth daily. Swallow whole.     b complex vitamins capsule Take 1 capsule by mouth daily. With D3 gummie     betamethasone dipropionate 0.05 % lotion Apply 1-2 times daily to affected itchy areas at scalp. Avoid applying to face, groin, and axilla. Use as directed. Long-term use can cause thinning of the skin. 60 mL 2   cetirizine (ZYRTEC) 10 MG tablet Take 10 mg by mouth daily.     clindamycin (CLEOCIN T) 1 % external solution Apply topically to acne bumps at aa's qd 30 mL 3   clobetasol (TEMOVATE) 0.05 %  external solution Apply 1 application. topically 2 (two) times daily. Use as directed.  Mix with cerave cream. Use for up to 4 weeks. Avoid applying to face, groin, and axilla. Use as directed. 50 mL 1   clobetasol cream (TEMOVATE) 0.05 % Apply small amount daily before bed to right ankle or other areas of body as needed for itchy rash Avoid applying to face, groin, and axilla Use as directed. 30 g 0   cyclobenzaprine (FLEXERIL) 5 MG tablet Take 1 tablet (5 mg total) by mouth at bedtime as needed for muscle spasms. 30 tablet 1   diphenhydrAMINE-zinc acetate (BENADRYL EXTRA STRENGTH) cream Apply 1 application topically 3 (three) times daily as needed for itching. 28.4 g 0   docusate sodium (COLACE) 100 MG capsule Take 100 mg by mouth 2 (two) times daily.     DULoxetine (CYMBALTA) 30 MG capsule Take 60 mg by mouth daily.     Dupilumab (DUPIXENT) 300 MG/2ML SOPN Inject 300 mg into the skin every 14 (fourteen) days.  Starting at day 15 for maintenance. 4 mL 5   Fluocinolone Acetonide 0.01 % OIL Use 1 - 2 drop qd/bid prn for scale at ears 20 mL 2   fluocinonide (LIDEX) 0.05 % external solution Apply once or twice daily to affected areas on scalp up to 2 weeks as needed for itching. Avoid applying to face, groin, and axilla. 60 mL 2   FLUoxetine (PROZAC) 20 MG capsule Take 1 capsule (20 mg total) by mouth daily. 90 capsule 1   fluticasone (FLONASE ALLERGY RELIEF) 50 MCG/ACT nasal spray Place 2 sprays into both nostrils daily as needed for allergies or rhinitis. (Patient taking differently: Place 2 sprays into both nostrils daily.) 9.9 mL 2   fluticasone-salmeterol (ADVAIR) 250-50 MCG/ACT AEPB as needed.     gabapentin (NEURONTIN) 300 MG capsule Take 300 mg by mouth 3 (three) times daily.     hydrOXYzine (VISTARIL) 25 MG capsule Take 1 capsule (25 mg total) by mouth every 8 (eight) hours as needed. Take before bed as needed for itchy. Can cause drowsiness. 60 capsule 2   ketoconazole (NIZORAL) 2 % shampoo Apply 1 Application topically daily as needed for irritation. 120 mL 11   lubiprostone (AMITIZA) 8 MCG capsule TAKE 1 CAPSULE BY MOUTH TWICE DAILY WITH A MEAL 180 capsule 0   montelukast (SINGULAIR) 10 MG tablet TAKE 1 TABLET BY MOUTH AT BEDTIME 30 tablet 1   naproxen (NAPROSYN) 500 MG tablet Take 500 mg by mouth 2 (two) times daily.     omega-3 acid ethyl esters (LOVAZA) 1 g capsule Take 2 capsules (2 g total) by mouth 2 (two) times daily. 120 capsule 3   omeprazole (PRILOSEC) 40 MG capsule Take 1 capsule (40 mg total) by mouth 2 (two) times daily. 180 capsule 3   ondansetron (ZOFRAN) 4 MG tablet Take 1 tablet (4 mg total) by mouth every 6 (six) hours as needed for nausea. (Patient not taking: Reported on 12/27/2022) 20 tablet 0   Potassium 99 MG TABS Take 1 tablet by mouth daily.     propranolol (INDERAL) 20 MG tablet Take one tablet 20 mg  twice daily  may take an  additional dose of 20 mg  as needed for  palpitations 190 tablet 3   senna (SENOKOT) 8.6 MG tablet Take by mouth.     tacrolimus (PROTOPIC) 0.1 % ointment APPLY TOPICALLY TO AFFECTED AREAS OF BODY FOR RASH DAILY OR TWICE DAILY AS NEEDED 30 g 0  No current facility-administered medications on file prior to visit.    Allergies  Allergen Reactions   Gramineae Pollens Itching   Claritin [Loratadine] Other (See Comments)    Heart race   Loratadine-Pseudoephedrine Er Palpitations   Pollen Extract Other (See Comments)    Seasonal allergies    Problem  Mixed Hyperlipidemia    Current Medications: Lovaza 2 gm twice daily  Intolerances: none  Risk Factors: 10 years ASCVD risk ??7.5%, pre-diabetes, angina,NASH  LDL goal: <70 mg/dl        Mixed hyperlipidemia Assessment:  Hypertriglyceridemia very high TG level- 987 (Jan/2024) (goal: < 150 mg/dl)  Takes Lovaza 2 gm twice daily regularly and tolerates it well without any side effects Use to be on Crestor 20 mg - was tolerating well do not know why was it d/c  Eats out 3 meals per day everyday. When eat at home eats processed food  Does not do any physical activity due to general fatigue and SOB   Plan: Start taking Crestor 40 mg daily  Continue taking current medications Lovaza 2 g twice daily  PharmD to follow up in 2 weeks via phone to assess tolerability on Crestor 40 mg if not tolerated well will reduce dose to 20 mg  Discussed importance of healthy diet and regular exercise in detail; provided "High Triglycerides Eating Plan" In person 4 weeks follow up scheduled with PharmD; will be adding Tricor  Patient to start healthy eating and regular exercise (10 min walks twice daily with 5 min of chair exercise twice daily every day slowly up the time of exercise as tolerated)    Thank you,  Cammy Copa, Pharm.D  [facility] [address] [place], [place] [address]  Phone: [phone]; Fax: [phone]

## 2023-01-01 NOTE — Patient Instructions (Addendum)
We will place an order for a mask fitting session to determine the best mask for you to use with your CPAP machine.   Ok to use albuterol inhaler as needed.   Follow up in 6 months to review CPAP compliance.

## 2023-01-01 NOTE — Progress Notes (Signed)
Synopsis: Referred in September 2022 for Cough and shortness of breath  Subjective:   PATIENT ID: Elijah Shaffer GENDER: male DOB: 12-Sep-1981, MRN: [id number]  HPI  Chief Complaint  Patient presents with   Follow-up    F/U on SOB and fatigue. States he felt well for a few months but the symptoms have returned.    Elijah Shaffer is a 42 year old male, never smoker with history of GERD, large hiatal hernia and seasonal allergies who returns to pulmonary clinic for follow up of dyspnea, fatigue and obstructive sleep apnea.   He was feeling well for a few months, but recently he has noticed events of shortness of breath, tired feeling in his legs and light headed. He has noticed these symptoms with exertion. No cough or wheezing.  He has not been consistently using his CPAP machine over recent months. He is having trouble with mask leak and fit.   OV 02/15/22 He was started on CPAP therapy and his CPAP download shows 100% compliance with 0.7 AHI. He initially had a full face mask which was switched to a hybrid nasal pillow full face mask due to the skin on his nose getting sore.  He is following with Neurology. He had an abnormal EMG and has elevated protein on CSF evaluation. There are CSF tests pending at this time.   OV 01/02/22 He continues to have easy fatigability and dyspnea with exertion. Heart cath 2/1 shows normal coronary arteries and normal hemodynamics. ICS/LABA inhaler therapy stopped at last visit with no changes in his symptoms.  He is being evaluated by rheumatology for elevated CK level and RNP ab level. He is being evaluated for numbness and tingling symptoms by neurology and is ordered for an EMG and cervical spine MRI.   He went to urgent care on 2/16 for shortness of breath and chest pains. Given patient's concerns for tick exposures, lyme and [place] spotted fever labs ordered. RMSF IgG is negative and IgM is elevated at 1.67. D-dimer 2/16 was not elevated. He saw  his PCP on 12/31/21 and was prescribed doxycyline but has not taken this medication yet.   Home sleep study 12/12/21 showed moderate obstructive sleep apnea.   OV 10/31/21 HRCT chest does not indicate ILD involvement. There is a large hiatal hernia noted. Pulmonary function tests show mild diffusion defect with normal spirometry and normal TLC. It was difficult to get consistent data during the pulmonary function testing as the patient was getting fatigued.  Echo from 02/24/21 showed mildly dilated RV with normal function.   CK level is mildly elevated at 356 and ENA RNP ab is elevated. Remaining inflammatory workup is unremarkable which included myositis panel, ANA, CCP, dsDNA, RF, ESR, CRP and ddimer. Hypersensitivity pneumonitis panel is negative.  He continues to have exertional dyspnea and easily fatigued with exertion. He describes a pre-syncopal event when working recently. He continues to work as a Development worker, international aid.   He reports a small raised dry patch of skin on left lower extremity, lateral side and similar rash on right lateral ankle that is itchy. He also reports feeling pain in his legs when laying down at night, even with out a hard days work.    OV 10/02/21 He called in after being seen on 11/18 complaining of similar complaints as discussed in the office visit that day. He complains of more fatigue than normal and shortness of breath with exertion. He has felt lightheaded with exertion before. He feels very run down.  He denies any fevers or chills. His appetite remains good.  OV 09/28/21 He continues to experience exertional dyspnea. He was trialed on as needed albuterol with some improvement but continues to experience significant dyspnea with exertion. For example, he was short of breath walking around [employer] this past week. He denies wheezing. He does have cough with greenish-yellowish sputum.   Nasal congestion is improved with fluticasone nasal spray. He is in leaf season now as a  Development worker, international aid.   He was unable to have PFTs done since last visit as his mother was recently diagnosed with cervical cancer and they were occupied with her treatment schedule.   He denies leg swelling, chest pain or syncopal episodes.  He raised the question with his previous work exposures using round-up and other herbicide chemicals could lead to his breathing issues.  OV 07/24/21 He reports shortness of breath over the last 4 months.  The shortness of breath is evident with exertion and resolves with rest.  Patient has been treated for tickborne illness with concern for Ehrlichia and has been on doxycycline.  He is being evaluated by infectious disease today.  He has been much more inactive over these last 4 months.  He works as a Development worker, international aid and has not been working as much due to the shortness of breath.  He denies any wheezing or chest tightness.  He has intermittent dry cough that is occasionally productive with sputum.  He is being treated with PPI therapy for GERD and has a small hiatal hernia on CT imaging.  He does report seasonal allergies with nasal and sinus congestion more so in the spring and fall.  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  He denies any history of blood clots.  No family history of blood clots.  No family history of lung disease.  No history of smoking or vaping.  He does report some intermittent secondhand smoke exposure via social gatherings.  He did grow up in a home with a wood stove.  Past Medical History:  Diagnosis Date   Acute renal injury ([place]) 06/28/2017   Anxiety    Aspiration pneumonia of right upper lobe due to gastric secretions (HCC)    Diverticulitis    GERD (gastroesophageal reflux disease) 01/08/2018   Seasonal allergies    Syncope 06/26/2021     Family History  Problem Relation Age of Onset   Healthy Mother    Cervical cancer Mother    Cancer Father    Colon cancer Maternal Grandmother    Colon cancer Maternal Grandfather       Social History   Socioeconomic History   Marital status: Single    Spouse name: Not on file   Number of children: Not on file   Years of education: Not on file   Highest education level: Not on file  Occupational History   Not on file  Tobacco Use   Smoking status: Never    Passive exposure: Past   Smokeless tobacco: Never  Vaping Use   Vaping Use: Never used  Substance and Sexual Activity   Alcohol use: Yes    Comment: occ   Drug use: No   Sexual activity: Not on file  Other Topics Concern   Not on file  Social History Narrative   He currently has his own lawn care landscaping service. He previous had 80 yards before he got sick and since decreased. He works around a [employer]. One of the chemical names  is roundup.    Social Determinants of Health   Financial Resource Strain: Not on file  Food Insecurity: No Food Insecurity (12/27/2022)   Hunger Vital Sign    Worried About Running Out of Food in the Last Year: Never true    Ran Out of Food in the Last Year: Never true  Transportation Needs: No Transportation Needs (12/27/2022)   PRAPARE - Hydrologist (Medical): No    Lack of Transportation (Non-Medical): No  Physical Activity: Insufficiently Active (03/29/2022)   Exercise Vital Sign    Days of Exercise per Week: 2 days    Minutes of Exercise per Session: 10 min  Stress: Stress Concern Present (05/07/2022)   [place]    Feeling of Stress : To some extent  Social Connections: Not on file  Intimate Partner Violence: Not At Risk (12/27/2022)   Humiliation, Afraid, Rape, and Kick questionnaire    Fear of Current or Ex-Partner: No    Emotionally Abused: No    Physically Abused: No    Sexually Abused: No     Allergies  Allergen Reactions   Gramineae Pollens Itching   Claritin [Loratadine] Other (See Comments)    Heart race   Loratadine-Pseudoephedrine Er  Palpitations   Pollen Extract Other (See Comments)    Seasonal allergies     Outpatient Medications Prior to Visit  Medication Sig Dispense Refill   albuterol (VENTOLIN HFA) 108 (90 Base) MCG/ACT inhaler Inhale 2 puffs into the lungs every 6 (six) hours as needed for shortness of breath. 8 g 1   aspirin EC 81 MG tablet Take 81 mg by mouth daily. Swallow whole.     b complex vitamins capsule Take 1 capsule by mouth daily. With D3 gummie     betamethasone dipropionate 0.05 % lotion Apply 1-2 times daily to affected itchy areas at scalp. Avoid applying to face, groin, and axilla. Use as directed. Long-term use can cause thinning of the skin. 60 mL 2   cetirizine (ZYRTEC) 10 MG tablet Take 10 mg by mouth daily.     Cholecalciferol (VITAMIN D-3 PO) Take by mouth.     clindamycin (CLEOCIN T) 1 % external solution Apply topically to acne bumps at aa's qd 30 mL 3   clobetasol (TEMOVATE) 0.05 % external solution Apply 1 application. topically 2 (two) times daily. Use as directed.  Mix with cerave cream. Use for up to 4 weeks. Avoid applying to face, groin, and axilla. Use as directed. 50 mL 1   clobetasol cream (TEMOVATE) 0.05 % Apply small amount daily before bed to right ankle or other areas of body as needed for itchy rash Avoid applying to face, groin, and axilla Use as directed. 30 g 0   cyclobenzaprine (FLEXERIL) 5 MG tablet Take 1 tablet (5 mg total) by mouth at bedtime as needed for muscle spasms. 30 tablet 1   diphenhydrAMINE-zinc acetate (BENADRYL EXTRA STRENGTH) cream Apply 1 application topically 3 (three) times daily as needed for itching. 28.4 g 0   docusate sodium (COLACE) 100 MG capsule Take 100 mg by mouth 2 (two) times daily.     DULoxetine (CYMBALTA) 30 MG capsule Take 60 mg by mouth daily.     Dupilumab (DUPIXENT) 300 MG/2ML SOPN Inject 300 mg into the skin every 14 (fourteen) days. Starting at day 15 for maintenance. 4 mL 5   Fluocinolone Acetonide 0.01 % OIL Use 1 - 2 drop qd/bid  prn for scale at ears 20 mL 2   fluocinonide (LIDEX) 0.05 % external solution Apply once or twice daily to affected areas on scalp up to 2 weeks as needed for itching. Avoid applying to face, groin, and axilla. 60 mL 2   FLUoxetine (PROZAC) 20 MG capsule Take 1 capsule (20 mg total) by mouth daily. 90 capsule 1   fluticasone (FLONASE ALLERGY RELIEF) 50 MCG/ACT nasal spray Place 2 sprays into both nostrils daily as needed for allergies or rhinitis. (Patient taking differently: Place 2 sprays into both nostrils daily.) 9.9 mL 2   gabapentin (NEURONTIN) 300 MG capsule Take 300 mg by mouth 3 (three) times daily.     hydrOXYzine (VISTARIL) 25 MG capsule Take 1 capsule (25 mg total) by mouth every 8 (eight) hours as needed. Take before bed as needed for itchy. Can cause drowsiness. 60 capsule 2   ketoconazole (NIZORAL) 2 % shampoo Apply 1 Application topically daily as needed for irritation. 120 mL 11   lubiprostone (AMITIZA) 8 MCG capsule TAKE 1 CAPSULE BY MOUTH TWICE DAILY WITH A MEAL 180 capsule 0   montelukast (SINGULAIR) 10 MG tablet TAKE 1 TABLET BY MOUTH AT BEDTIME 30 tablet 1   naproxen (NAPROSYN) 500 MG tablet Take 500 mg by mouth 2 (two) times daily.     omega-3 acid ethyl esters (LOVAZA) 1 g capsule Take 2 capsules (2 g total) by mouth 2 (two) times daily. 120 capsule 3   omeprazole (PRILOSEC) 40 MG capsule Take 1 capsule (40 mg total) by mouth 2 (two) times daily. 180 capsule 3   ondansetron (ZOFRAN) 4 MG tablet Take 1 tablet (4 mg total) by mouth every 6 (six) hours as needed for nausea. 20 tablet 0   Potassium 99 MG TABS Take 1 tablet by mouth daily.     propranolol (INDERAL) 20 MG tablet Take one tablet 20 mg  twice daily  may take an  additional dose of 20 mg  as needed for palpitations 190 tablet 3   rosuvastatin (CRESTOR) 40 MG tablet Take 1 tablet (40 mg total) by mouth daily. 90 tablet 3   senna (SENOKOT) 8.6 MG tablet Take by mouth.     tacrolimus (PROTOPIC) 0.1 % ointment APPLY  TOPICALLY TO AFFECTED AREAS OF BODY FOR RASH DAILY OR TWICE DAILY AS NEEDED 30 g 0   fluticasone-salmeterol (ADVAIR) 250-50 MCG/ACT AEPB as needed.     No facility-administered medications prior to visit.   Review of Systems  Constitutional:  Positive for malaise/fatigue. Negative for chills, fever and weight loss.  HENT:  Negative for congestion, sinus pain and sore throat.   Eyes: Negative.   Respiratory:  Positive for shortness of breath. Negative for cough, hemoptysis, sputum production and wheezing.   Cardiovascular:  Negative for chest pain, palpitations, orthopnea, claudication and leg swelling.  Gastrointestinal:  Negative for abdominal pain, heartburn, nausea and vomiting.  Genitourinary: Negative.   Musculoskeletal:  Negative for joint pain and myalgias.       Leg pain  Skin:  Negative for rash.  Neurological:  Positive for dizziness. Negative for weakness.  Endo/Heme/Allergies: Negative.   Psychiatric/Behavioral:  Positive for depression. The patient is nervous/anxious.     Objective:   Vitals:   01/01/23 1114  BP: 118/76  Pulse: 67  SpO2: 95%  Weight: 264 lb (119.7 kg)  Height: 6' 3"$  (1.905 m)   Physical Exam Constitutional:      General: He is not in acute distress.    Appearance:  He is not ill-appearing or toxic-appearing.  HENT:     Head: Normocephalic and atraumatic.  Eyes:     Conjunctiva/sclera: Conjunctivae normal.  Cardiovascular:     Rate and Rhythm: Normal rate and regular rhythm.     Pulses: Normal pulses.     Heart sounds: Normal heart sounds. No murmur heard. Pulmonary:     Effort: Pulmonary effort is normal.     Breath sounds: No decreased air movement. No wheezing, rhonchi or rales.  Musculoskeletal:     Right lower leg: No edema.     Left lower leg: No edema.  Skin:    General: Skin is warm and dry.     Findings: No rash.  Neurological:     Mental Status: He is alert and oriented to person, place, and time.  Psychiatric:        Mood  and Affect: Mood normal.        Behavior: Behavior normal.        Thought Content: Thought content normal.        Judgment: Judgment normal.    CBC    Component Value Date/Time   WBC 10.4 12/27/2022 1042   RBC 5.41 12/27/2022 1042   HGB 16.3 12/27/2022 1042   HGB 18.2 (H) 12/23/2022 1127   HCT 47.7 12/27/2022 1042   HCT 53.8 (H) 12/23/2022 1127   PLT 262 12/27/2022 1042   PLT 285 12/23/2022 1127   MCV 88.2 12/27/2022 1042   MCV 89 12/23/2022 1127   MCH 30.1 12/27/2022 1042   MCHC 34.2 12/27/2022 1042   RDW 12.9 12/27/2022 1042   RDW 12.7 12/23/2022 1127   LYMPHSABS 3.3 12/27/2022 1042   LYMPHSABS 2.7 12/02/2022 0859   MONOABS 0.9 12/27/2022 1042   EOSABS 0.1 12/27/2022 1042   EOSABS 0.1 12/02/2022 0859   BASOSABS 0.1 12/27/2022 1042   BASOSABS 0.1 12/02/2022 0859      Latest Ref Rng & Units 12/02/2022    8:59 AM 12/27/2021    2:43 PM 12/12/2021    8:56 AM  BMP  Glucose 70 - 99 mg/dL 108  83    BUN 6 - 24 mg/dL 16  19    Creatinine 0.76 - 1.27 mg/dL 1.75  1.31    BUN/Creat Ratio 9 - 20 9     Sodium 134 - 144 mmol/L 139  134  142   Potassium 3.5 - 5.2 mmol/L 5.3  3.8  3.4   Chloride 96 - 106 mmol/L 99  104    CO2 20 - 29 mmol/L 21  20    Calcium 8.7 - 10.2 mg/dL 10.4  9.2     Chest imaging: HRCT Chest 10/17/21 1. No findings to suggest interstitial lung disease. No acute findings in the thorax to account for the patient's symptoms. 2. Small pulmonary nodules measuring up to 5 mm in the lateral segment of the right middle lobe, nonspecific, but statistically likely benign. No follow-up needed if patient is low-risk (and has no known or suspected primary neoplasm). Non-contrast chest CT can be considered in 12 months if patient is high-risk.  3. Large hiatal hernia. 4. Mild hepatic steatosis. 5. Colonic diverticulosis  CXR 10/03/21 Increased interstitial prominence bilaterally with medial right upper lobe opacity along the mediastinal border.   CXR 07/19/21 Mild  scarring of right lateral base. Small to moderate hiatal hernia.  Increased retrosternal airspace  PFT:    Latest Ref Rng & Units 10/30/2021    9:06 AM  PFT Results  FVC-Pre L 5.65   FVC-Predicted Pre % 91   FVC-Post L 5.94   FVC-Predicted Post % 96   Pre FEV1/FVC % % 88   Post FEV1/FCV % % 82   FEV1-Pre L 4.97   FEV1-Predicted Pre % 101   FEV1-Post L 4.88   DLCO uncorrected ml/min/mmHg 24.00   DLCO UNC% % 66   DLCO corrected ml/min/mmHg 23.19   DLCO COR %Predicted % 64   DLVA Predicted % 88   TLC L 7.27   TLC % Predicted % 91   RV % Predicted % 55   10/30/21: Mild diffusion defect with normal spirometry and TLC.  Echo 02/24/21: LV EF 60-65%. Normal diastolic parameters. RV systolic function is normal. RV is mildly enlarged.   [place] 12/12/21: RA = 4 RV = 23/4 PA = 22/6 (14) PCW = 7 Fick cardiac output/index = 7.1/3.0 PVR = 1.0 WU Ao sat = 95% PA sat = 76%, 76% SVC sat = 74%  Assessment & Plan:   OSA (obstructive sleep apnea) - Plan: Desensitization mask fit  Discussion: Hitoshi Borsch is a 42 year old male, never smoker with history of GERD, large hiatal hernia and seasonal allergies who returns to pulmonary clinic for follow up of dyspnea, fatigue and obstructive sleep apnea.   We will order him a CPAP mask fitting session to determine the best mask for him. Will likely need a full face mask due to being a mouth breather.   We discussed how his untreated sleep apnea can lead to fatigue/sleepiness and dyspnea during the day time.   Follow up in 6 months for CPAP review.  Freda Jackson, MD [facility] Office: [phone]   Current Outpatient Medications:    albuterol (VENTOLIN HFA) 108 (90 Base) MCG/ACT inhaler, Inhale 2 puffs into the lungs every 6 (six) hours as needed for shortness of breath., Disp: 8 g, Rfl: 1   aspirin EC 81 MG tablet, Take 81 mg by mouth daily. Swallow whole., Disp: , Rfl:    b complex vitamins capsule, Take 1  capsule by mouth daily. With D3 gummie, Disp: , Rfl:    betamethasone dipropionate 0.05 % lotion, Apply 1-2 times daily to affected itchy areas at scalp. Avoid applying to face, groin, and axilla. Use as directed. Long-term use can cause thinning of the skin., Disp: 60 mL, Rfl: 2   cetirizine (ZYRTEC) 10 MG tablet, Take 10 mg by mouth daily., Disp: , Rfl:    Cholecalciferol (VITAMIN D-3 PO), Take by mouth., Disp: , Rfl:    clindamycin (CLEOCIN T) 1 % external solution, Apply topically to acne bumps at aa's qd, Disp: 30 mL, Rfl: 3   clobetasol (TEMOVATE) 0.05 % external solution, Apply 1 application. topically 2 (two) times daily. Use as directed.  Mix with cerave cream. Use for up to 4 weeks. Avoid applying to face, groin, and axilla. Use as directed., Disp: 50 mL, Rfl: 1   clobetasol cream (TEMOVATE) 0.05 %, Apply small amount daily before bed to right ankle or other areas of body as needed for itchy rash Avoid applying to face, groin, and axilla Use as directed., Disp: 30 g, Rfl: 0   cyclobenzaprine (FLEXERIL) 5 MG tablet, Take 1 tablet (5 mg total) by mouth at bedtime as needed for muscle spasms., Disp: 30 tablet, Rfl: 1   diphenhydrAMINE-zinc acetate (BENADRYL EXTRA STRENGTH) cream, Apply 1 application topically 3 (three) times daily as needed for itching., Disp: 28.4 g, Rfl: 0   docusate  sodium (COLACE) 100 MG capsule, Take 100 mg by mouth 2 (two) times daily., Disp: , Rfl:    DULoxetine (CYMBALTA) 30 MG capsule, Take 60 mg by mouth daily., Disp: , Rfl:    Dupilumab (DUPIXENT) 300 MG/2ML SOPN, Inject 300 mg into the skin every 14 (fourteen) days. Starting at day 15 for maintenance., Disp: 4 mL, Rfl: 5   Fluocinolone Acetonide 0.01 % OIL, Use 1 - 2 drop qd/bid prn for scale at ears, Disp: 20 mL, Rfl: 2   fluocinonide (LIDEX) 0.05 % external solution, Apply once or twice daily to affected areas on scalp up to 2 weeks as needed for itching. Avoid applying to face, groin, and axilla., Disp: 60 mL, Rfl:  2   FLUoxetine (PROZAC) 20 MG capsule, Take 1 capsule (20 mg total) by mouth daily., Disp: 90 capsule, Rfl: 1   fluticasone (FLONASE ALLERGY RELIEF) 50 MCG/ACT nasal spray, Place 2 sprays into both nostrils daily as needed for allergies or rhinitis. (Patient taking differently: Place 2 sprays into both nostrils daily.), Disp: 9.9 mL, Rfl: 2   gabapentin (NEURONTIN) 300 MG capsule, Take 300 mg by mouth 3 (three) times daily., Disp: , Rfl:    hydrOXYzine (VISTARIL) 25 MG capsule, Take 1 capsule (25 mg total) by mouth every 8 (eight) hours as needed. Take before bed as needed for itchy. Can cause drowsiness., Disp: 60 capsule, Rfl: 2   ketoconazole (NIZORAL) 2 % shampoo, Apply 1 Application topically daily as needed for irritation., Disp: 120 mL, Rfl: 11   lubiprostone (AMITIZA) 8 MCG capsule, TAKE 1 CAPSULE BY MOUTH TWICE DAILY WITH A MEAL, Disp: 180 capsule, Rfl: 0   montelukast (SINGULAIR) 10 MG tablet, TAKE 1 TABLET BY MOUTH AT BEDTIME, Disp: 30 tablet, Rfl: 1   naproxen (NAPROSYN) 500 MG tablet, Take 500 mg by mouth 2 (two) times daily., Disp: , Rfl:    omega-3 acid ethyl esters (LOVAZA) 1 g capsule, Take 2 capsules (2 g total) by mouth 2 (two) times daily., Disp: 120 capsule, Rfl: 3   omeprazole (PRILOSEC) 40 MG capsule, Take 1 capsule (40 mg total) by mouth 2 (two) times daily., Disp: 180 capsule, Rfl: 3   ondansetron (ZOFRAN) 4 MG tablet, Take 1 tablet (4 mg total) by mouth every 6 (six) hours as needed for nausea., Disp: 20 tablet, Rfl: 0   Potassium 99 MG TABS, Take 1 tablet by mouth daily., Disp: , Rfl:    propranolol (INDERAL) 20 MG tablet, Take one tablet 20 mg  twice daily  may take an  additional dose of 20 mg  as needed for palpitations, Disp: 190 tablet, Rfl: 3   rosuvastatin (CRESTOR) 40 MG tablet, Take 1 tablet (40 mg total) by mouth daily., Disp: 90 tablet, Rfl: 3   senna (SENOKOT) 8.6 MG tablet, Take by mouth., Disp: , Rfl:    tacrolimus (PROTOPIC) 0.1 % ointment, APPLY TOPICALLY TO  AFFECTED AREAS OF BODY FOR RASH DAILY OR TWICE DAILY AS NEEDED, Disp: 30 g, Rfl: 0

## 2023-01-01 NOTE — Patient Instructions (Addendum)
High Triglycerides Eating Plan ?Triglycerides are a type of fat in the blood. High levels of triglycerides can increase your risk of heart disease and stroke. If your triglyceride levels are high, choosing the right foods can help lower your triglycerides and keep your heart healthy. Work with your health care provider or a dietitian to develop an eating plan that is right for you. ?What are tips for following this plan? ?General guidelines ? ?Lose weight, if you are overweight. For most people, losing 5-10 lb (2-5 kg) helps lower triglyceride levels. A weight-loss plan may include: ?30 minutes of exercise at least 5 days a week. ?Reducing the amount of calories, sugar, and fat you eat. ?Eat a wide variety of fresh fruits, vegetables, and whole grains. These foods are high in fiber. ?Eat foods that contain healthy fats, such as fatty fish, nuts, seeds, and olive oil. ?Avoid foods that are high in added sugar, added salt (sodium), and saturated fat. ?Avoid low-fiber, refined carbohydrates such as white bread, crackers, noodles, and white rice. ?Avoid foods with trans fats or partially hydrogenated oils, such as fried foods or stick margarine. ?If you drink alcohol: ?Limit how much you have to: ?0-1 drink a day for women who are not pregnant. ?0-2 drinks a day for men. ?Your health care provider may recommend that you drink less than these amounts depending on your overall health. ?Know how much alcohol is in a drink. In the [place] one drink equals one 12 oz bottle of beer (355 mL), one 5 oz glass of wine (148 mL), or one 1? oz glass of hard liquor (44 mL). ?Reading food labels ?Check food labels for: ?The amount of saturated fat. Choose foods with no or very little saturated fat (less than 2 g). ?The amount of trans fat. Choose foods with no transfat. ?The amount of cholesterol. Choose foods that are low in cholesterol. ?The amount of sodium. Choose foods with less than 140 milligrams (mg) per serving. ?Shopping ?Buy  dairy products labeled as nonfat (skim) or low-fat (1%). ?Avoid buying processed or prepackaged foods. These are often high in added sugar, sodium, and fat. ?Cooking ?Choose healthy fats when cooking, such as olive oil, avocado oil, or canola oil. ?Cook foods using lower fat methods, such as baking, broiling, boiling, or grilling. ?Make your own sauces, dressings, and marinades when possible, instead of buying them. Store-bought sauces, dressings, and marinades are often high in sodium and sugar. ?Meal planning ?Eat more home-cooked food and less restaurant, buffet, and fast food. ?Eat fatty fish at least 2 times each week. Examples of fatty fish include salmon, trout, sardines, mackerel, tuna, and herring. ?If you eat whole eggs, do not eat more than 4 egg yolks per week. ?What foods should I eat? ?Fruits ?All fresh, canned (in natural juice), or frozen fruits. ?Vegetables ?Fresh or frozen vegetables. Low-sodium canned vegetables. ?Grains ?Whole wheat or whole grain breads, crackers, cereals, and pasta. Unsweetened oatmeal. Bulgur. Barley. Quinoa. Brown rice. Whole wheat flour tortillas. ?Meats and other proteins ?Skinless chicken or turkey. Ground chicken or turkey. Lean cuts of pork, trimmed of fat. Fish and seafood, especially salmon, trout, and herring. Egg whites. Dried beans, peas, or lentils. Unsalted nuts or seeds. Unsalted canned beans. Natural peanut or almond butter or other nut butters. ?Dairy ?Low-fat dairy products. Skim or low-fat (1%) milk. Reduced fat (2%) and low-sodium cheese. Low-fat ricotta cheese. Low-fat cottage cheese. Plain, low-fat yogurt. ?Fats and oils ?Tub margarine without trans fats. Light or reduced-fat mayonnaise. Light or   reduced-fat salad dressings. Avocado. Safflower, olive, sunflower, soybean, and canola oils. ?The items listed above may not be a complete list of recommended foods and beverages. Talk with your dietitian about what dietary choices are best for you. ?What foods  should I avoid? ?Fruits ?Sweetened dried fruit. Canned fruit in syrup. Fruit juice. ?Vegetables ?Creamed or fried vegetables. Vegetables in a cheese sauce. ?Grains ?White bread. White (regular) pasta. White rice. Cornbread. Bagels. Pastries. Crackers that contain trans fat. ?Meats and other proteins ?Fatty cuts of meat. Ribs. Chicken wings. Bacon. Sausage. Bologna. Salami. Chitterlings. Fatback. Hot dogs. Bratwurst. Packaged lunch meats. ?Dairy ?Whole or reduced-fat (2%) milk. Half-and-half. Cream cheese. Full-fat or sweetened yogurt. Full-fat cheese. Nondairy creamers. Whipped toppings. Processed cheese or cheese spreads. Cheese curds. ?Fats and oils ?Butter. Stick margarine. Lard. Shortening. Ghee. Bacon fat. Tropical oils, such as coconut, palm kernel, or palm oils. ?Beverages ?Alcohol. Sweetened drinks, such as soda, lemonade, fruit drinks, or punches. ?Sweets and desserts ?Corn syrup. Sugars. Honey. Molasses. Candy. Jam and jelly. Syrup. Sweetened cereals. Cookies. Pies. Cakes. Donuts. Muffins. Ice cream. ?Condiments ?Store-bought sauces, dressings, and marinades that are high in sugar, such as ketchup and barbecue sauce. ?The items listed above may not be a complete list of foods and beverages you should avoid. Talk with your dietitian about what dietary choices are best for you. ?Summary ?High levels of triglycerides can increase the risk of heart disease and stroke. Choosing the right foods can help lower your triglycerides. ?Eat plenty of fresh fruits, vegetables, and whole grains. Choose low-fat dairy and lean meats. Eat fatty fish at least twice a week. ?Avoid processed and prepackaged foods with added sugar, sodium, saturated fat, and trans fat. ?If you need suggestions or have questions about what types of food are good for you, talk with your health care provider or a dietitian. ?This information is not intended to replace advice given to you by your health care provider. Make sure you discuss any  questions you have with your health care provider. ?Document Revised: 03/09/2021 Document Reviewed: 03/09/2021 ?Elsevier Patient Education ? 2023 Elsevier Inc. ? ?

## 2023-01-01 NOTE — Assessment & Plan Note (Signed)
Assessment:  Hypertriglyceridemia very high TG level- 987 (Jan/2024) (goal: < 150 mg/dl)  Takes Lovaza 2 gm twice daily regularly and tolerates it well without any side effects Use to be on Crestor 20 mg - was tolerating well do not know why was it d/c  Eats out 3 meals per day everyday. When eat at home eats processed food  Does not do any physical activity due to general fatigue and SOB   Plan: Start taking Crestor 40 mg daily  Continue taking current medications Lovaza 2 g twice daily  PharmD to follow up in 2 weeks via phone to assess tolerability on Crestor 40 mg if not tolerated well will reduce dose to 20 mg  Discussed importance of healthy diet and regular exercise in detail; provided "High Triglycerides Eating Plan" In person 4 weeks follow up scheduled with PharmD; will be adding Tricor  Patient to start healthy eating and regular exercise (10 min walks twice daily with 5 min of chair exercise twice daily every day slowly up the time of exercise as tolerated)

## 2023-01-02 ENCOUNTER — Ambulatory Visit (HOSPITAL_COMMUNITY): Payer: 59

## 2023-01-02 DIAGNOSIS — M546 Pain in thoracic spine: Secondary | ICD-10-CM

## 2023-01-02 DIAGNOSIS — M545 Low back pain, unspecified: Secondary | ICD-10-CM

## 2023-01-02 DIAGNOSIS — D751 Secondary polycythemia: Secondary | ICD-10-CM | POA: Diagnosis not present

## 2023-01-02 NOTE — Therapy (Signed)
OUTPATIENT PHYSICAL THERAPY LUMBAR AND THORACIC TREATMENT   Patient Name: Elijah Shaffer MRN: [id number] DOB:Dec 20, 1980, 42 y.o., male Today's Date: 01/02/2023  END OF SESSION:    PT End of Session - 01/02/23 1406     Visit Number 7    Number of Visits 8    Date for PT Re-Evaluation 01/08/23    Authorization Type [employer]; no VL and no auth    Progress Note Due on Visit 8    PT Start Time 1405    PT Stop Time [id number]    PT Time Calculation (min) 40 min             Past Medical History:  Diagnosis Date   Acute renal injury ([place]) 06/28/2017   Anxiety    Aspiration pneumonia of right upper lobe due to gastric secretions ([place])    Diverticulitis    GERD (gastroesophageal reflux disease) 01/08/2018   Seasonal allergies    Syncope 06/26/2021   Past Surgical History:  Procedure Laterality Date   BIOPSY  03/18/2021   Procedure: BIOPSY;  Surgeon: Harvel Quale, MD;  [facility];  Service: Gastroenterology;;  esophageal at [facility]   BIOPSY  05/23/2021   Procedure: BIOPSY;  Surgeon: Harvel Quale, MD;  [facility];  Service: Gastroenterology;;   CARDIOPULMONARY EXERCISE TEST (CPX)  01/09/2022   Interpretation limited by submaximal effort.  Severe functional impairment when compared to max sedentary norms.  No cardiopulmonary limitations.  Noted tremor and generalized weakness that lead to premature exercise termination-consider neuromuscular evaluation.   COLONOSCOPY N/A 09/22/2017   Procedure: COLONOSCOPY;  Surgeon: Rogene Houston, MD;  [facility];  Service: Endoscopy;  Laterality: N/A;  9:55   COLONOSCOPY WITH PROPOFOL N/A 05/23/2021   Procedure: COLONOSCOPY WITH PROPOFOL;  Surgeon: Harvel Quale, MD;  [facility];  Service: Gastroenterology;  Laterality: N/A;  7:30   ESOPHAGOGASTRODUODENOSCOPY (EGD) WITH PROPOFOL N/A 03/18/2021   Procedure: ESOPHAGOGASTRODUODENOSCOPY (EGD) WITH PROPOFOL;   Surgeon: Harvel Quale, MD;  [facility];  Service: Gastroenterology;  Laterality: N/A;   ESOPHAGOGASTRODUODENOSCOPY (EGD) WITH PROPOFOL N/A 05/23/2021   Procedure: ESOPHAGOGASTRODUODENOSCOPY (EGD) WITH PROPOFOL;  Surgeon: Harvel Quale, MD;  [facility];  Service: Gastroenterology;  Laterality: N/A;   POLYPECTOMY  09/22/2017   Procedure: POLYPECTOMY;  Surgeon: Rogene Houston, MD;  [facility];  Service: Endoscopy;;   RIGHT/LEFT HEART CATH AND CORONARY ANGIOGRAPHY N/A 12/12/2021   Procedure: RIGHT/LEFT HEART CATH AND CORONARY ANGIOGRAPHY;  Surgeon: Jolaine Artist, MD;  Location: [facility];  Service: Cardiovascular:: Normal Coronaries & Hemodynamics.  EF 50-55%. RA = 4 mmHg, RV = 23/4; PA = 22/6 (14) & PCW = 7; Ao sat = 95%; PA sat = 76%, 76%& SVC sat = 74%Fick cardiac output/index = 7.1/3.0; PVR = 1.0 WU   TRANSTHORACIC ECHOCARDIOGRAM  02/24/2021   EF 60 to 65%.  Normal LV function.  Normal valves.  Mild RV dilation with normal function.   ZIO PATCH EVENT MONITOR  01/2022   Sinus rhythm with heart rate range 24 to 159 bpm, 1  AVB and Wenckebach block noted along with rare PACs and PVCs.  No arrhythmias.   Patient Active Problem List   Diagnosis Date Noted   Erythrocytosis 12/03/2022   Encounter for general adult medical examination with abnormal findings 12/02/2022   OSA (obstructive sleep apnea) 12/02/2022   Urinary retention 11/05/2022   Cervical spinal stenosis  07/29/2022   DDD (degenerative disc disease), lumbar 07/29/2022   NASH (nonalcoholic steatohepatitis) 06/10/2022   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/2022   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   PSVT (paroxysmal supraventricular tachycardia) 03/25/2022   Itching 03/07/2022   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   Bloating 03/07/2022   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   [facility] spotted fever 12/31/2021   Encounter for examination  following treatment at hospital 12/31/2021   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   Chronic sinusitis 12/04/2021   Mixed hyperlipidemia 11/30/2021   Esophageal hiatal hernia 11/22/2021   Irritable bowel syndrome with constipation 11/22/2021   SOB (shortness of breath) on exertion 06/28/2021   Anxiety 06/26/2021   Eczema 06/26/2021   Barrett's esophagus 04/11/2021   History of colonic polyps 04/11/2021   Focal neurological deficit 02/23/2021   GERD (gastroesophageal reflux disease) 01/08/2018    PCP: Ihor Dow, MD  REFERRING PROVIDER: Judith Part, MD  REFERRING DIAG: M41.9 (ICD-10-CM) - Scoliosis, unspecified  THERAPY DIAG:  Low back pain, unspecified back pain laterality, unspecified chronicity, unspecified whether sciatica present  Pain in thoracic spine  Rationale for Evaluation and Treatment: Rehabilitation  ONSET DATE: chronic; worse over the last year  SUBJECTIVE:   SUBJECTIVE STATEMENT: States he saw a nutritionist today and states he needs to change his diet; pre-diabetic; fatty liver.  Did a little work today; bending over and picking up sticks and able to push wheelbarrow although it was not too full without much issue.  Reports general fatigue from working.  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  PCP referred to [facility].  MRI done of the thoracic spine; referred to therapy; he also goes to chiropractor some; Dr. Bethann Goo.  Reports weak feeling in legs  PERTINENT HISTORY: Last year [facility] spotted fever with subsequent  tremors and passing out Large hiatal hernia. See above PAIN:  Are you having pain? No  PRECAUTIONS: None  WEIGHT BEARING RESTRICTIONS: No  FALLS:  Has patient fallen in last 6 months? No  LIVING ENVIRONMENT: Lives with: lives with their family Lives in: House/apartment Stairs: Yes: External: 6 steps; on right going up, on left going up, and can reach both Has following  equipment at home: None  OCCUPATION: Tree surgeon maintenance  PLOF: Independent  PATIENT GOALS: help my back feel better  NEXT MD VISIT: Dr. Jabier Mutton (neurologist)  OBJECTIVE:   DIAGNOSTIC FINDINGS: IMPRESSION: 1. Mild degenerative changes of the thoracic spine without high-grade spinal canal or neural foraminal stenosis at any level. 2. Exaggerated thoracic kyphosis related to minimal chronic compression fractures, as detailed above. 3. Large hiatal hernia.  IMPRESSION: 1. Moderate right foraminal encroachment due to spurring and disc protrusion. Slight cord flattening on the right due to spurring. 2. Otherwise no significant spinal stenosis or cord compression.     Electronically Signed   By: Pedro Earls M.D.   On: 09/06/2022 14:22  PATIENT SURVEYS:  Modified Oswestry 15/50 30%   COGNITION: Overall cognitive status: Within functional limits for tasks assessed     SENSATION: Reports occasional numbness right thigh   POSTURE: rounded shoulders, forward head, and increased thoracic kyphosis slight right side rib hump; significant kyphosis thoracic spine  PALPATION: General tenderness of the back on palpation  LOWER EXTREMITY MMT:  MMT Right eval Left eval  Hip flexion 4+* 5  Hip extension 4 4+  Hip abduction    Hip adduction  Hip internal rotation    Hip external rotation    Knee flexion 5 4+  Knee extension 5* 5  Ankle dorsiflexion 5 5  Ankle plantarflexion    Ankle inversion    Ankle eversion     (Blank rows = not tested)   LUMBAR ROM:   Active  A/PROM  eval  Flexion 85% available *  Extension 50% available*  Right lateral flexion To knee  Left lateral flexion To just above knee  Right rotation   Left rotation    (Blank rows = not tested)   FUNCTIONAL TESTS:  5 times sit to stand: 18.80 Sec  TODAY'S TREATMENT:                                                                                                                               DATE:  01/02/23 Supine: LTR x 2' Hip adduction with ball with bridge x 10 Hip abduction with GTB with bridge 3" hold x 10 Dead bug 2 x 10  Sidelying Clam GTB  2 x 10 each  Prone: Press ups 2" x 10  Sitting: Isometric abdominal sets 5" 2 x 10 with physioball Hamstring stretch with strap 5 x 20" each     12/31/22 Supine: LTR x 2' Hooklying hip adduction with ball 5" hold x 10 Hip abduction with GTB with bridge 3" hold x 10  Sidelying: Clam GTB 2 x 10 each way  Prone: Press ups 10" x 10  Seated: Abdominal isometrics 5" hold 2 x 10 Hamstring stretch with strap 3 x 20"   12/27/22 Hooklying hip adduction x 3" x 10 x 2 Bridging with hip abd, GTB, 3" x 10 x 2 Sidelying clamshells, GTB x 10 x 2 on each Supine R sciatic nerve floss x 1' LTR x 1' Prone on elbows x 5" x 10 Prone press ups x 5" x 10 Seated abdominal isometrics with physioball x 3" x 10 x 2 Seated hamstring stretch x 30" x 3 Standing Hip hinges x 10  12/25/22 Hooklying hip adduction x 3" x 10 x 2 Bridging x 10 x 2 Hooklying hip abd, RTB x 3" x 10 x 2 Supine R sciatic nerve floss x 1' LTR x 1' Prone on elbows x 5" x 10 Prone press ups x 5" x 10 Seated hamstring stretch x 30" x 3  12/20/22 Grade 1 long axis distraction on L hip x 30" x 3 followed by Grade 1 inf thrust on the last rep Hooklying hip adduction x 3" x 10 x 2 Bridging x 10 x 2 Hooklying hip abd isom with a belt x 3" x 10 x 2 Supine R sciatic nerve floss with pillow x 1' Prone on elbows x 5" x 10 Prone press ups x 5" x 10 Seated hamstring stretch x 30" x 3  12/17/2022 Review of HEP and goals Decompression exercises 5" hold x 8 each Head press Shoulder press Leg lengthener Leg  press  12/11/22 physical therapy evaluation and HEP instruction    PATIENT EDUCATION:  Education details: updated HEP Education method: Explanation, Demonstration, and Handouts Education comprehension: verbalized understanding,  returned demonstration, verbal cues required, and tactile cues required HOME EXERCISE PROGRAM: 01/02/23 hamstring stretch with strap, dead bug  Access Code: [email or web address] URL: [email or web address]  12/27/22 - Clamshell with Resistance  - 1-2 x daily - 7 x weekly - 2 sets - 10 reps - Supine Bridge with Resistance Band  - 1-2 x daily - 7 x weekly - 2 sets - 10 reps  12/25/22 - Lower Trunk Rotations  - 1-2 x daily - 7 x weekly  12/20/22 - Supine Bridge  - 1-2 x daily - 7 x weekly - 2 sets - 10 reps - Supine Hip Adduction Isometric with Ball  - 1-2 x daily - 7 x weekly - 2 sets - 10 reps - 3 hold - Hooklying Isometric Hip Abduction with Belt  - 1-2 x daily - 7 x weekly - 1-2 sets - 10 reps - 3 hold - Seated Hamstring Stretch  - 1-2 x daily - 7 x weekly - 3 reps - 30 hold - Supine Sciatic Nerve Mobilization With Leg on Pillow  - 1-2 x daily - 7 x weekly  12/17/2022 decompression exercises; hold all other HEP for now  Date: 12/11/2022 Prepared by: [facility]  Exercises - Static Prone on Elbows  - 4 x daily - 7 x weekly - 1 sets - 1 reps - 2 minutes hold - Prone Press Up  - 4 x daily - 7 x weekly - 1 sets - 10 reps - Seated Thoracic Lumbar Extension  - 4 x daily - 7 x weekly - 1 sets - 10 reps - Doorway Pec Stretch at 90 Degrees Abduction  - 2 x daily - 7 x weekly - 1 sets - 5 reps - 20 sec hold  ASSESSMENT:  CLINICAL IMPRESSION: Today's session continued to work on spinal mobility and core strengthening.  Patient demonstrates improving smoothness of movement; good recall of exercises.  Added dead bug today; patient needs cues for technique; abdominal bracing during exercise. Updated HEP.  To date, skilled PT is required to address the impairments and improve function.    Eval:Patient is a 42 y.o. male who was seen today for physical therapy evaluation and treatment for M41.9 (ICD-10-CM) - Scoliosis, unspecified. Patient presents to physical therapy with complaint of back pain.  Patient demonstrates muscle weakness, reduced ROM, and fascial restrictions which are likely contributing to symptoms of pain and are negatively impacting patient ability to perform ADLs and functional mobility tasks. Patient will benefit from skilled physical therapy services to address these deficits to reduce pain and improve level of function with ADLs and functional mobility tasks.   OBJECTIVE IMPAIRMENTS: Abnormal gait, decreased activity tolerance, decreased knowledge of condition, decreased mobility, difficulty walking, decreased ROM, decreased strength, hypomobility, increased fascial restrictions, impaired perceived functional ability, impaired flexibility, postural dysfunction, and pain.   ACTIVITY LIMITATIONS: carrying, lifting, bending, standing, squatting, sleeping, reach over head, locomotion level, and caring for others  PARTICIPATION LIMITATIONS: meal prep, cleaning, laundry, shopping, community activity, occupation, and yard work  PERSONAL FACTORS: Profession are also affecting patient's functional outcome.   REHAB POTENTIAL: Good  CLINICAL DECISION MAKING: Stable/uncomplicated  EVALUATION COMPLEXITY: Low   GOALS: Goals reviewed with patient? No  SHORT TERM GOALS: Target date: 12/25/2022 patient will be independent with initial HEP  Baseline: Goal status: IN PROGRESS  2.  Patient will self report 30% improvement to improve tolerance for functional activity   Baseline:  Goal status: IN PROGRESS   LONG TERM GOALS: Target date: 01/08/2023  Patient will be independent in self management strategies to improve quality of life and functional outcomes.   Baseline:  Goal status: IN PROGRESS  2.  Patient will improve modified Oswestry score by 10 points to demonstrate improved functional mobility Baseline: 15/50 Goal status: IN PROGRESS  3.  Patient will increase bilateral leg MMTs to 5/5 without pain to promote return to ambulation community distances with  minimal deviation.  Baseline: see above Goal status: IN PROGRESS  4.  Patient will self report 50% improvement to improve tolerance for functional activity  Baseline:  Goal status: IN PROGRESS   PLAN:  PT FREQUENCY: 2x/week  PT DURATION: 4 weeks  PLANNED INTERVENTIONS: Therapeutic exercises, Therapeutic activity, Neuromuscular re-education, Balance training, Gait training, Patient/Family education, Joint manipulation, Joint mobilization, Stair training, Orthotic/Fit training, DME instructions, Aquatic Therapy, Dry Needling, Electrical stimulation, Spinal manipulation, Spinal mobilization, Cryotherapy, Moist heat, Compression bandaging, scar mobilization, Splintting, Taping, Traction, Ultrasound, Ionotophoresis 30m/ml Dexamethasone, and Manual therapy   PLAN FOR NEXT SESSION:  Continue POC and may progress as tolerated with emphasis on postural and core strengthening; lower extremity strengthening; body mechanics training;reassess next visit  2:47 PM, 01/02/23 Jaz Mallick Small Eliya Bubar MPT [facility] [place] #[phone]:[phone]

## 2023-01-06 ENCOUNTER — Ambulatory Visit (INDEPENDENT_AMBULATORY_CARE_PROVIDER_SITE_OTHER): Payer: 59 | Admitting: Gastroenterology

## 2023-01-06 ENCOUNTER — Encounter: Payer: Self-pay | Admitting: Family Medicine

## 2023-01-06 ENCOUNTER — Encounter (HOSPITAL_COMMUNITY): Payer: 59

## 2023-01-06 ENCOUNTER — Other Ambulatory Visit: Payer: Self-pay

## 2023-01-06 ENCOUNTER — Ambulatory Visit (INDEPENDENT_AMBULATORY_CARE_PROVIDER_SITE_OTHER): Payer: 59 | Admitting: Family Medicine

## 2023-01-06 ENCOUNTER — Emergency Department (HOSPITAL_COMMUNITY)
Admission: EM | Admit: 2023-01-06 | Discharge: 2023-01-06 | Discharge status: Against Medical Advice | Payer: 59 | Attending: Emergency Medicine | Admitting: Emergency Medicine

## 2023-01-06 ENCOUNTER — Encounter (INDEPENDENT_AMBULATORY_CARE_PROVIDER_SITE_OTHER): Payer: Self-pay | Admitting: Gastroenterology

## 2023-01-06 VITALS — BP 134/86 | HR 86 | Ht 73.0 in | Wt 260.0 lb

## 2023-01-06 VITALS — BP 133/87 | HR 103 | Temp 97.8°F | Ht 74.0 in | Wt 261.6 lb

## 2023-01-06 DIAGNOSIS — Z5321 Procedure and treatment not carried out due to patient leaving prior to being seen by health care provider: Secondary | ICD-10-CM | POA: Diagnosis not present

## 2023-01-06 DIAGNOSIS — R109 Unspecified abdominal pain: Secondary | ICD-10-CM | POA: Diagnosis not present

## 2023-01-06 DIAGNOSIS — R197 Diarrhea, unspecified: Secondary | ICD-10-CM | POA: Diagnosis not present

## 2023-01-06 DIAGNOSIS — R1030 Lower abdominal pain, unspecified: Secondary | ICD-10-CM | POA: Diagnosis not present

## 2023-01-06 DIAGNOSIS — K921 Melena: Secondary | ICD-10-CM | POA: Diagnosis not present

## 2023-01-06 DIAGNOSIS — K581 Irritable bowel syndrome with constipation: Secondary | ICD-10-CM

## 2023-01-06 LAB — JAK2 V617F RFX CALR/MPL/E12-15

## 2023-01-06 LAB — CALR +MPL + E12-E15  (REFLEX)

## 2023-01-06 MED ORDER — LUBIPROSTONE 8 MCG PO CAPS: 8.0000 ug | ORAL_CAPSULE | Freq: Two times a day (BID) | ORAL | 3 refills | Status: DC

## 2023-01-06 MED ORDER — LOPERAMIDE HCL 2 MG PO TABS: 2.0000 mg | ORAL_TABLET | Freq: Four times a day (QID) | ORAL | 0 refills | Status: DC | PRN

## 2023-01-06 MED ORDER — CIPROFLOXACIN HCL 500 MG PO TABS: 500.0000 mg | ORAL_TABLET | Freq: Two times a day (BID) | ORAL | 0 refills | Status: AC

## 2023-01-06 NOTE — ED Triage Notes (Signed)
Pt c/o lower abdominal pain and diarrhea that started last night. Reports seeing scant amount of bright red blood in stool and "purple looking mucus".

## 2023-01-06 NOTE — Progress Notes (Signed)
Patient Office Visit   Subjective   Patient ID: Elijah Shaffer, male    DOB: 1981-05-27  Age: 42 y.o. MRN: [id number]  CC:  Chief Complaint  Patient presents with   Abdominal Pain    Patient complains of diarrhea with some blood and abdominal pain since 8pm last night.     HPI Elijah Shaffer 42 year old male, presents to clinic for abdominal pain started last night at 8 pm . He  has a past medical history of Acute renal injury ([place]) (06/28/2017), Anxiety, Aspiration pneumonia of right upper lobe due to gastric secretions ([place]), Diverticulitis, GERD (gastroesophageal reflux disease) (01/08/2018), Seasonal allergies, and Syncope (06/26/2021).  Abdominal Pain This is a new problem. The current episode started yesterday. The onset quality is sudden. The problem occurs intermittently. The problem has been gradually improving. The pain is located in the suprapubic region. The pain is at a severity of 5/10. The pain is moderate. The quality of the pain is aching and colicky. The abdominal pain does not radiate. Associated symptoms include diarrhea, a fever and melena. Pertinent negatives include no constipation, hematochezia, hematuria, myalgias, nausea or vomiting. The pain is aggravated by bowel movement. He has tried nothing for the symptoms. His past medical history is significant for irritable bowel syndrome.      Outpatient Encounter Medications as of 01/06/2023  Medication Sig   albuterol (VENTOLIN HFA) 108 (90 Base) MCG/ACT inhaler Inhale 2 puffs into the lungs every 6 (six) hours as needed for shortness of breath.   aspirin EC 81 MG tablet Take 81 mg by mouth daily. Swallow whole.   b complex vitamins capsule Take 1 capsule by mouth daily. With D3 gummie   B Complex-Folic Acid (B COMPLEX VITAMINS, W/ FA,) CAPS Take by mouth.   betamethasone dipropionate 0.05 % lotion Apply 1-2 times daily to affected itchy areas at scalp. Avoid applying to face, groin, and axilla. Use as directed.  Long-term use can cause thinning of the skin.   cetirizine (ZYRTEC) 10 MG tablet Take 10 mg by mouth daily.   Cholecalciferol (VITAMIN D-3 PO) Take by mouth.   ciprofloxacin (CIPRO) 500 MG tablet Take 1 tablet (500 mg total) by mouth 2 (two) times daily for 7 days. (Patient not taking: Reported on 01/06/2023)   clindamycin (CLEOCIN T) 1 % external solution Apply topically to acne bumps at aa's qd   clobetasol (TEMOVATE) 0.05 % external solution Apply 1 application. topically 2 (two) times daily. Use as directed.  Mix with cerave cream. Use for up to 4 weeks. Avoid applying to face, groin, and axilla. Use as directed.   clobetasol cream (TEMOVATE) 0.05 % Apply small amount daily before bed to right ankle or other areas of body as needed for itchy rash Avoid applying to face, groin, and axilla Use as directed.   clonazePAM (KLONOPIN) 0.5 MG tablet Take 0.5 mg by mouth daily.   cyclobenzaprine (FLEXERIL) 5 MG tablet Take 1 tablet (5 mg total) by mouth at bedtime as needed for muscle spasms.   diphenhydrAMINE-zinc acetate (BENADRYL EXTRA STRENGTH) cream Apply 1 application topically 3 (three) times daily as needed for itching.   docusate sodium (COLACE) 100 MG capsule Take 100 mg by mouth 2 (two) times daily.   DULoxetine (CYMBALTA) 30 MG capsule Take 60 mg by mouth daily.   Dupilumab (DUPIXENT) 300 MG/2ML SOPN Inject 300 mg into the skin every 14 (fourteen) days. Starting at day 15 for maintenance.   Fluocinolone Acetonide 0.01 %  OIL Use 1 - 2 drop qd/bid prn for scale at ears   fluocinonide (LIDEX) 0.05 % external solution Apply once or twice daily to affected areas on scalp up to 2 weeks as needed for itching. Avoid applying to face, groin, and axilla.   FLUoxetine (PROZAC) 20 MG capsule Take 1 capsule (20 mg total) by mouth daily.   fluticasone (FLONASE ALLERGY RELIEF) 50 MCG/ACT nasal spray Place 2 sprays into both nostrils daily as needed for allergies or rhinitis. (Patient taking differently: Place  2 sprays into both nostrils daily.)   fluticasone-salmeterol (ADVAIR) 250-50 MCG/ACT AEPB as needed.   gabapentin (NEURONTIN) 300 MG capsule Take 300 mg by mouth 3 (three) times daily.   guaiFENesin (MUCINEX) 600 MG 12 hr tablet Take by mouth.   hydrOXYzine (ATARAX) 25 MG tablet Take 25 mg by mouth daily at 6 (six) AM.   hydrOXYzine (VISTARIL) 25 MG capsule Take 1 capsule (25 mg total) by mouth every 8 (eight) hours as needed. Take before bed as needed for itchy. Can cause drowsiness.   ketoconazole (NIZORAL) 2 % shampoo Apply 1 Application topically daily as needed for irritation.   loperamide (IMODIUM A-D) 2 MG tablet Take 1 tablet (2 mg total) by mouth 4 (four) times daily as needed for diarrhea or loose stools.   montelukast (SINGULAIR) 10 MG tablet TAKE 1 TABLET BY MOUTH AT BEDTIME   naproxen (NAPROSYN) 500 MG tablet Take 500 mg by mouth 2 (two) times daily.   omega-3 acid ethyl esters (LOVAZA) 1 g capsule Take 2 capsules (2 g total) by mouth 2 (two) times daily.   omeprazole (PRILOSEC) 40 MG capsule Take 1 capsule (40 mg total) by mouth 2 (two) times daily.   ondansetron (ZOFRAN) 4 MG tablet Take 1 tablet (4 mg total) by mouth every 6 (six) hours as needed for nausea.   pantoprazole (PROTONIX) 40 MG tablet Take 40 mg by mouth daily.   potassium gluconate (RA POTASSIUM GLUCONATE) 595 (99 K) MG TABS tablet Take 1 tablet by mouth daily.   propranolol (INDERAL) 20 MG tablet Take one tablet 20 mg  twice daily  may take an  additional dose of 20 mg  as needed for palpitations   rosuvastatin (CRESTOR) 40 MG tablet Take 1 tablet (40 mg total) by mouth daily.   senna (SENOKOT) 8.6 MG tablet Take 1 tablet by mouth daily. prn   tacrolimus (PROTOPIC) 0.1 % ointment APPLY TOPICALLY TO AFFECTED AREAS OF BODY FOR RASH DAILY OR TWICE DAILY AS NEEDED   triamcinolone cream (KENALOG) 0.1 %    [DISCONTINUED] lubiprostone (AMITIZA) 8 MCG capsule TAKE 1 CAPSULE BY MOUTH TWICE DAILY WITH A MEAL   [DISCONTINUED]  Potassium 99 MG TABS Take 1 tablet by mouth daily.   No facility-administered encounter medications on file as of 01/06/2023.    Past Surgical History:  Procedure Laterality Date   BIOPSY  03/18/2021   Procedure: BIOPSY;  Surgeon: Harvel Quale, MD;  [facility]: [facility];  Service: Gastroenterology;;  esophageal at [facility]   BIOPSY  05/23/2021   Procedure: BIOPSY;  Surgeon: Harvel Quale, MD;  Location: [facility];  Service: Gastroenterology;;   CARDIOPULMONARY EXERCISE TEST (CPX)  01/09/2022   Interpretation limited by submaximal effort.  Severe functional impairment when compared to max sedentary norms.  No cardiopulmonary limitations.  Noted tremor and generalized weakness that lead to premature exercise termination-consider neuromuscular evaluation.   COLONOSCOPY N/A 09/22/2017   Procedure: COLONOSCOPY;  Surgeon: Rogene Houston, MD;  Location: [facility];  Service: Endoscopy;  Laterality: N/A;  9:55   COLONOSCOPY WITH PROPOFOL N/A 05/23/2021   Procedure: COLONOSCOPY WITH PROPOFOL;  Surgeon: Harvel Quale, MD;  Location: [facility];  Service: Gastroenterology;  Laterality: N/A;  7:30   ESOPHAGOGASTRODUODENOSCOPY (EGD) WITH PROPOFOL N/A 03/18/2021   Procedure: ESOPHAGOGASTRODUODENOSCOPY (EGD) WITH PROPOFOL;  Surgeon: Harvel Quale, MD;  Location: [facility];  Service: Gastroenterology;  Laterality: N/A;   ESOPHAGOGASTRODUODENOSCOPY (EGD) WITH PROPOFOL N/A 05/23/2021   Procedure: ESOPHAGOGASTRODUODENOSCOPY (EGD) WITH PROPOFOL;  Surgeon: Harvel Quale, MD;  Location: [facility];  Service: Gastroenterology;  Laterality: N/A;   POLYPECTOMY  09/22/2017   Procedure: POLYPECTOMY;  Surgeon: Rogene Houston, MD;  Location: [facility];  Service: Endoscopy;;   RIGHT/LEFT HEART CATH AND CORONARY ANGIOGRAPHY N/A 12/12/2021   Procedure: RIGHT/LEFT HEART CATH AND CORONARY ANGIOGRAPHY;  Surgeon: Jolaine Artist, MD;   Location: [facility];  Service: Cardiovascular:: Normal Coronaries & Hemodynamics.  EF 50-55%. RA = 4 mmHg, RV = 23/4; PA = 22/6 (14) & PCW = 7; Ao sat = 95%; PA sat = 76%, 76%& SVC sat = 74%Fick cardiac output/index = 7.1/3.0; PVR = 1.0 WU   TRANSTHORACIC ECHOCARDIOGRAM  02/24/2021   EF 60 to 65%.  Normal LV function.  Normal valves.  Mild RV dilation with normal function.   ZIO PATCH EVENT MONITOR  01/2022   Sinus rhythm with heart rate range 24 to 159 bpm, 1  AVB and Wenckebach block noted along with rare PACs and PVCs.  No arrhythmias.    Review of Systems  Constitutional:  Positive for chills and fever.  Gastrointestinal:  Positive for abdominal pain, diarrhea and melena. Negative for constipation, hematochezia, nausea and vomiting.  Genitourinary:  Negative for hematuria.  Musculoskeletal:  Negative for myalgias.      Objective    BP 134/86   Pulse 86   Ht '6\' 1"'$  (1.854 m)   Wt 260 lb (117.9 kg)   SpO2 93%   BMI 34.30 kg/m   Physical Exam Constitutional:      General: He is not in acute distress. Cardiovascular:     Rate and Rhythm: Normal rate.  Pulmonary:     Effort: Pulmonary effort is normal.     Breath sounds: Normal breath sounds.  Abdominal:     General: Bowel sounds are normal. There is no distension or abdominal bruit.     Palpations: Abdomen is soft. There is no mass or pulsatile mass.     Tenderness: There is abdominal tenderness in the suprapubic area. There is no right CVA tenderness, left CVA tenderness or rebound. Negative signs include Rovsing's sign and obturator sign.  Neurological:     Mental Status: He is alert.       Assessment & Plan:  Acute abdominal pain -     GI Profile, Stool, PCR -     Ciprofloxacin HCl; Take 1 tablet (500 mg total) by mouth 2 (two) times daily for 7 days. (Patient not taking: Reported on 01/06/2023)  Dispense: 14 tablet; Refill: 0 -     Loperamide HCl; Take 1 tablet (2 mg total) by mouth 4 (four) times daily as  needed for diarrhea or loose stools.  Dispense: 30 tablet; Refill: 0 -     CBC -     Basic metabolic panel -     Lipase -     CT ABDOMEN PELVIS WO CONTRAST; Future -  H. pylori antigen, stool  Lower abdominal pain Assessment & Plan: Prescribed ciprofloxacin 500 mg BID X 7 days, loperamide 2 mg, labs ordered, CT Abdomen Pelvis ordered Patient is already  being followed by GI and will schedule an appointment. Advise to Increase oral fluid intake. Bland diet as tolerated. Follow-up in unable to keep food/fluid down x 24 hours, dizziness, fevers, worsening or persistent symptoms to present to ED or contact primary care provider. Patient verbalizes understanding regarding plan of care and all questions answered.      No follow-ups on file.   Renard Hamper Ria Comment, FNP

## 2023-01-06 NOTE — Patient Instructions (Addendum)
Proceed with ordered stool testing Follow up with Dr. Posey Pronto regarding high triglycerides Do not start antibiotics yet until stool results are back, unless your diarrhea is very severe  If presenting recurrent constipation this week, can restart Sennokot as needed Restart Amitiza 8 mcg BID in 2 weeks

## 2023-01-06 NOTE — Assessment & Plan Note (Signed)
Prescribed ciprofloxacin 500 mg BID X 7 days, loperamide 2 mg, labs ordered, CT Abdomen Pelvis ordered Patient is already  being followed by GI and will schedule an appointment. Advise to Increase oral fluid intake. Bland diet as tolerated. Follow-up in unable to keep food/fluid down x 24 hours, dizziness, fevers, worsening or persistent symptoms to present to ED or contact primary care provider. Patient verbalizes understanding regarding plan of care and all questions answered.

## 2023-01-06 NOTE — Patient Instructions (Signed)
It was pleasure meeting with you today. Please take medications as prescribed. Follow up with your primary health provider if any health concerns arises. If symptoms worsen please contact your primary care provider and/or visit the emergency department.

## 2023-01-06 NOTE — Progress Notes (Addendum)
Elijah Shaffer, M.D. Gastroenterology & Hepatology [facility] [address] [place], [place] [address]  Primary Care Physician: Lindell Spar, MD [address] [place] [id number]  I will communicate my assessment and recommendations to the referring MD via EMR.  Problems: Acute diarrhea. 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 male with history of GERD complicated by long segment nondysplastic Barrett's esophagus, history of diverticulitis and anxiety, GERD, OSA, large hiatal hernia and polyneuropathy, who presents for evalaution of new onset diarrhea.  The patient was last seen on 06/10/2022. At that time, the patient was counseled to implement that maintaining diet to achieve weight loss.  He was also advised to continue IBgard as needed for bloating episodes and to decrease omeprazole to 40 mg every day.  Was continued on Amitiza 8 mcg twice a day.  Notably, the patient has labs from 12/02/2022 showing severely elevated triglycerides of 987, creatinine of 1.75.  Also had labs done on 12/27/2022 which showed CBC with WBC of 10.4, hemoglobin 16.3 and platelets 262.  LDH was 190, had negative JAK2 mutation  Patient reports that yesterday night he presented new onset of abdominal pain of the lower abdomen. He reports this was followed by multiple episodes of diarrhea described as watery stools with fresh blood. He reports that the pain has subsided in the AM today, only had some discomfort when he defecated today. He states he was defecating when he wanted to urinate, but today he did not have this anymore. He was able to eat in the morning today.  He reports that he noticed that he has been taking stool softeners more often as he was not taking them very often (Sennokot) - he states he ran out of Amitiza and did  not call for a refill. Noticed since he stopped Amitiza, he has presented more abdominal distention.  Patient was seen at his PCPs office in the morning today after presenting abdominal pain and rectal bleeding.  Was given prescription of ciprofloxacin and loperamide.  Patient was ordered H. pylori stool testing, GI profile PCR in stool, CBC, BMP and lipase but he has not collected the samples yet.  Notably, the patient went to the ER today but left AMA as they were taking too long to evaluate him.  The patient denies having any nausea, vomiting, fever, chills,  melena, hematemesis, abdominal distention, jaundice, pruritus or weight loss.  Last EGD: 05/2021 - Esophageal mucosal changes consistent with long-segment Barrett's esophagus, classified as Barrett's stage C5-M6 per Prague criteria (between 30-36 cm). Biopsied - positive for BE but neg for dysplasia. - 9 cm hiatal hernia. - Normal stomach. - Normal examined duodenum.   Recommended to repeat in 3 years.   Last Colonoscopy: 05/2021 -  The perianal and digital rectal examinations were normal. Multiple large-mouthed diverticula were found in the sigmoid colon, transverse colon, ascending colon and cecum. Non-bleeding internal hemorrhoids were found during retroflexion. The  hemorrhoids were small.   Recommended to repeat in 5 years due to family history of cancer.  Past Medical History: Past Medical History:  Diagnosis Date   Acute renal injury ([place]) 06/28/2017   Anxiety    Aspiration pneumonia of right upper lobe due to gastric secretions (HCC)    Diverticulitis    GERD (gastroesophageal reflux disease) 01/08/2018   Seasonal allergies    Syncope 06/26/2021  Past Surgical History: Past Surgical History:  Procedure Laterality Date   BIOPSY  03/18/2021   Procedure: BIOPSY;  Surgeon: Harvel Quale, MD;  [facility];  Service: Gastroenterology;;  esophageal at Z-line   BIOPSY  05/23/2021   Procedure:  BIOPSY;  Surgeon: Harvel Quale, MD;  [facility]: [facility];  Service: Gastroenterology;;   CARDIOPULMONARY EXERCISE TEST (CPX)  01/09/2022   Interpretation limited by submaximal effort.  Severe functional impairment when compared to max sedentary norms.  No cardiopulmonary limitations.  Noted tremor and generalized weakness that lead to premature exercise termination-consider neuromuscular evaluation.   COLONOSCOPY N/A 09/22/2017   Procedure: COLONOSCOPY;  Surgeon: Rogene Houston, MD;  [facility]: [facility];  Service: Endoscopy;  Laterality: N/A;  9:55   COLONOSCOPY WITH PROPOFOL N/A 05/23/2021   Procedure: COLONOSCOPY WITH PROPOFOL;  Surgeon: Harvel Quale, MD;  [facility];  Service: Gastroenterology;  Laterality: N/A;  7:30   ESOPHAGOGASTRODUODENOSCOPY (EGD) WITH PROPOFOL N/A 03/18/2021   Procedure: ESOPHAGOGASTRODUODENOSCOPY (EGD) WITH PROPOFOL;  Surgeon: Harvel Quale, MD;  [facility];  Service: Gastroenterology;  Laterality: N/A;   ESOPHAGOGASTRODUODENOSCOPY (EGD) WITH PROPOFOL N/A 05/23/2021   Procedure: ESOPHAGOGASTRODUODENOSCOPY (EGD) WITH PROPOFOL;  Surgeon: Harvel Quale, MD;  [facility];  Service: Gastroenterology;  Laterality: N/A;   POLYPECTOMY  09/22/2017   Procedure: POLYPECTOMY;  Surgeon: Rogene Houston, MD;  [facility];  Service: Endoscopy;;   RIGHT/LEFT HEART CATH AND CORONARY ANGIOGRAPHY N/A 12/12/2021   Procedure: RIGHT/LEFT HEART CATH AND CORONARY ANGIOGRAPHY;  Surgeon: Jolaine Artist, MD;  Location: [facility];  Service: Cardiovascular:: Normal Coronaries & Hemodynamics.  EF 50-55%. RA = 4 mmHg, RV = 23/4; PA = 22/6 (14) & PCW = 7; Ao sat = 95%; PA sat = 76%, 76%& SVC sat = 74%Fick cardiac output/index = 7.1/3.0; PVR = 1.0 WU   TRANSTHORACIC ECHOCARDIOGRAM  02/24/2021   EF 60 to 65%.  Normal LV function.  Normal valves.  Mild RV dilation with normal function.    ZIO PATCH EVENT MONITOR  01/2022   Sinus rhythm with heart rate range 24 to 159 bpm, 1  AVB and Wenckebach block noted along with rare PACs and PVCs.  No arrhythmias.    Family History: Family History  Problem Relation Age of Onset   Healthy Mother    Cervical cancer Mother    Cancer Father    Colon cancer Maternal Grandmother    Colon cancer Maternal Grandfather     Social History: Social History   Tobacco Use  Smoking Status Never   Passive exposure: Past  Smokeless Tobacco Never   Social History   Substance and Sexual Activity  Alcohol Use Yes   Comment: occ   Social History   Substance and Sexual Activity  Drug Use No    Allergies: Allergies  Allergen Reactions   Gramineae Pollens Itching   Claritin [Loratadine] Other (See Comments)    Heart race   Loratadine-Pseudoephedrine Er Palpitations   Pollen Extract Other (See Comments)    Seasonal allergies    Medications: Current Outpatient Medications  Medication Sig Dispense Refill   albuterol (VENTOLIN HFA) 108 (90 Base) MCG/ACT inhaler Inhale 2 puffs into the lungs every 6 (six) hours as needed for shortness of breath. 8 g 1   aspirin EC 81 MG tablet Take 81 mg by mouth daily. Swallow whole.     b complex vitamins capsule Take 1 capsule by  mouth daily. With D3 gummie     B Complex-Folic Acid (B COMPLEX VITAMINS, W/ FA,) CAPS Take by mouth.     betamethasone dipropionate 0.05 % lotion Apply 1-2 times daily to affected itchy areas at scalp. Avoid applying to face, groin, and axilla. Use as directed. Long-term use can cause thinning of the skin. 60 mL 2   cetirizine (ZYRTEC) 10 MG tablet Take 10 mg by mouth daily.     Cholecalciferol (VITAMIN D-3 PO) Take by mouth.     clindamycin (CLEOCIN T) 1 % external solution Apply topically to acne bumps at aa's qd 30 mL 3   clobetasol (TEMOVATE) 0.05 % external solution Apply 1 application. topically 2 (two) times daily. Use as directed.  Mix with cerave cream. Use  for up to 4 weeks. Avoid applying to face, groin, and axilla. Use as directed. 50 mL 1   clobetasol cream (TEMOVATE) 0.05 % Apply small amount daily before bed to right ankle or other areas of body as needed for itchy rash Avoid applying to face, groin, and axilla Use as directed. 30 g 0   clonazePAM (KLONOPIN) 0.5 MG tablet Take 0.5 mg by mouth daily.     cyclobenzaprine (FLEXERIL) 5 MG tablet Take 1 tablet (5 mg total) by mouth at bedtime as needed for muscle spasms. 30 tablet 1   diphenhydrAMINE-zinc acetate (BENADRYL EXTRA STRENGTH) cream Apply 1 application topically 3 (three) times daily as needed for itching. 28.4 g 0   docusate sodium (COLACE) 100 MG capsule Take 100 mg by mouth 2 (two) times daily.     DULoxetine (CYMBALTA) 30 MG capsule Take 60 mg by mouth daily.     Dupilumab (DUPIXENT) 300 MG/2ML SOPN Inject 300 mg into the skin every 14 (fourteen) days. Starting at day 15 for maintenance. 4 mL 5   Fluocinolone Acetonide 0.01 % OIL Use 1 - 2 drop qd/bid prn for scale at ears 20 mL 2   fluocinonide (LIDEX) 0.05 % external solution Apply once or twice daily to affected areas on scalp up to 2 weeks as needed for itching. Avoid applying to face, groin, and axilla. 60 mL 2   FLUoxetine (PROZAC) 20 MG capsule Take 1 capsule (20 mg total) by mouth daily. 90 capsule 1   fluticasone (FLONASE ALLERGY RELIEF) 50 MCG/ACT nasal spray Place 2 sprays into both nostrils daily as needed for allergies or rhinitis. (Patient taking differently: Place 2 sprays into both nostrils daily.) 9.9 mL 2   fluticasone-salmeterol (ADVAIR) 250-50 MCG/ACT AEPB as needed.     gabapentin (NEURONTIN) 300 MG capsule Take 300 mg by mouth 3 (three) times daily.     guaiFENesin (MUCINEX) 600 MG 12 hr tablet Take by mouth.     hydrOXYzine (ATARAX) 25 MG tablet Take 25 mg by mouth daily at 6 (six) AM.     hydrOXYzine (VISTARIL) 25 MG capsule Take 1 capsule (25 mg total) by mouth every 8 (eight) hours as needed. Take before bed as  needed for itchy. Can cause drowsiness. 60 capsule 2   ketoconazole (NIZORAL) 2 % shampoo Apply 1 Application topically daily as needed for irritation. 120 mL 11   loperamide (IMODIUM A-D) 2 MG tablet Take 1 tablet (2 mg total) by mouth 4 (four) times daily as needed for diarrhea or loose stools. 30 tablet 0   lubiprostone (AMITIZA) 8 MCG capsule TAKE 1 CAPSULE BY MOUTH TWICE DAILY WITH A MEAL 180 capsule 0   montelukast (SINGULAIR) 10 MG tablet TAKE 1  TABLET BY MOUTH AT BEDTIME 30 tablet 1   naproxen (NAPROSYN) 500 MG tablet Take 500 mg by mouth 2 (two) times daily.     omega-3 acid ethyl esters (LOVAZA) 1 g capsule Take 2 capsules (2 g total) by mouth 2 (two) times daily. 120 capsule 3   omeprazole (PRILOSEC) 40 MG capsule Take 1 capsule (40 mg total) by mouth 2 (two) times daily. 180 capsule 3   ondansetron (ZOFRAN) 4 MG tablet Take 1 tablet (4 mg total) by mouth every 6 (six) hours as needed for nausea. 20 tablet 0   pantoprazole (PROTONIX) 40 MG tablet Take 40 mg by mouth daily.     potassium gluconate (RA POTASSIUM GLUCONATE) 595 (99 K) MG TABS tablet Take 1 tablet by mouth daily.     propranolol (INDERAL) 20 MG tablet Take one tablet 20 mg  twice daily  may take an  additional dose of 20 mg  as needed for palpitations 190 tablet 3   rosuvastatin (CRESTOR) 40 MG tablet Take 1 tablet (40 mg total) by mouth daily. 90 tablet 3   senna (SENOKOT) 8.6 MG tablet Take 1 tablet by mouth daily. prn     tacrolimus (PROTOPIC) 0.1 % ointment APPLY TOPICALLY TO AFFECTED AREAS OF BODY FOR RASH DAILY OR TWICE DAILY AS NEEDED 30 g 0   triamcinolone cream (KENALOG) 0.1 %      ciprofloxacin (CIPRO) 500 MG tablet Take 1 tablet (500 mg total) by mouth 2 (two) times daily for 7 days. (Patient not taking: Reported on 01/06/2023) 14 tablet 0   No current facility-administered medications for this visit.    Review of Systems: GENERAL: negative for malaise, night sweats HEENT: No changes in hearing or vision, no  nose bleeds or other nasal problems. NECK: Negative for lumps, goiter, pain and significant neck swelling RESPIRATORY: Negative for cough, wheezing CARDIOVASCULAR: Negative for chest pain, leg swelling, palpitations, orthopnea GI: SEE HPI MUSCULOSKELETAL: Negative for joint pain or swelling, back pain, and muscle pain. SKIN: Negative for lesions, rash PSYCH: Negative for sleep disturbance, mood disorder and recent psychosocial stressors. HEMATOLOGY Negative for prolonged bleeding, bruising easily, and swollen nodes. ENDOCRINE: Negative for cold or heat intolerance, polyuria, polydipsia and goiter. NEURO: negative for tremor, gait imbalance, syncope and seizures. The remainder of the review of systems is noncontributory.   Physical Exam: BP 133/87 (BP Location: Left Arm, Patient Position: Sitting, Cuff Size: Large)   Pulse (!) 103   Temp 97.8 F (36.6 C) (Temporal)   Ht '6\' 2"'$  (1.88 m)   Wt 261 lb 9.6 oz (118.7 kg)   BMI 33.59 kg/m  GENERAL: The patient is AO x3, in no acute distress. HEENT: Head is normocephalic and atraumatic. EOMI are intact. Mouth is well hydrated and without lesions. NECK: Supple. No masses LUNGS: Clear to auscultation. No presence of rhonchi/wheezing/rales. Adequate chest expansion HEART: RRR, normal s1 and s2. ABDOMEN: tender in epigastric area, no guarding, no peritoneal signs, and nondistended. BS +. No masses. EXTREMITIES: Without any cyanosis, clubbing, rash, lesions or edema. NEUROLOGIC: AOx3, no focal motor deficit. SKIN: no jaundice, no rashes  Imaging/Labs: as above  I personally reviewed and interpreted the available labs, imaging and endoscopic files.  Impression and Plan: BERNERD JEREZ is a 42 y.o. male with history of GERD complicated by long segment nondysplastic Barrett's esophagus, history of diverticulitis and anxiety, GERD, OSA, large hiatal hernia and polyneuropathy, who presents for evalaution of new onset diarrhea.  The patient was  doing relatively  well in terms of his bowel movements, although he noticed change in the frequency when he ran out of his Amitiza.  However, he presented acute onset of bloody diarrhea since yesterday.  Given the time of onset of his symptoms, I consider this is related to possible gastrointestinal infection.  I advised him to proceed with stool collection to evaluate bacterial/viral etiology.  I advised him to hold off on starting antibiotics yet until he received the results of stool testing but may started earlier if his symptoms were to worsen.  He will follow-up tomorrow with his PCP regarding his hypertriglyceridemia, she will also follow-up regarding the symptom progression.  Once his diarrhea improves he can restart taking his Amitiza twice daily for IBS-C.  In the meantime, he can take Senokot as needed for episodes of constipation in between.  - Proceed with ordered stool testing - Follow up with Dr. Posey Pronto regarding high triglycerides -Do not start antibiotics yet until stool results are back, unless your diarrhea is very severe  -If presenting recurrent constipation this week, can restart Sennokot as needed -Restart Amitiza 8 mcg BID in 2 weeks  All questions were answered.      Elijah Peppers, MD Gastroenterology and Hepatology [facility]

## 2023-01-07 ENCOUNTER — Ambulatory Visit (INDEPENDENT_AMBULATORY_CARE_PROVIDER_SITE_OTHER): Payer: 59 | Admitting: Internal Medicine

## 2023-01-07 ENCOUNTER — Encounter: Payer: Self-pay | Admitting: Internal Medicine

## 2023-01-07 VITALS — BP 121/80 | HR 77 | Ht 74.0 in | Wt 262.2 lb

## 2023-01-07 DIAGNOSIS — F444 Conversion disorder with motor symptom or deficit: Secondary | ICD-10-CM

## 2023-01-07 DIAGNOSIS — K581 Irritable bowel syndrome with constipation: Secondary | ICD-10-CM | POA: Diagnosis not present

## 2023-01-07 DIAGNOSIS — F419 Anxiety disorder, unspecified: Secondary | ICD-10-CM | POA: Diagnosis not present

## 2023-01-07 DIAGNOSIS — R197 Diarrhea, unspecified: Secondary | ICD-10-CM

## 2023-01-07 DIAGNOSIS — R109 Unspecified abdominal pain: Secondary | ICD-10-CM | POA: Diagnosis not present

## 2023-01-07 MED ORDER — FLUOXETINE HCL 20 MG PO CAPS: 20.0000 mg | ORAL_CAPSULE | Freq: Every day | ORAL | 1 refills | Status: DC

## 2023-01-07 NOTE — Patient Instructions (Addendum)
Please stop taking Ciprofloxacin for now and we will wait for stool study.  Please continue taking other medications as prescribed.

## 2023-01-08 ENCOUNTER — Ambulatory Visit (HOSPITAL_COMMUNITY): Payer: 59

## 2023-01-08 DIAGNOSIS — M545 Low back pain, unspecified: Secondary | ICD-10-CM

## 2023-01-08 DIAGNOSIS — D751 Secondary polycythemia: Secondary | ICD-10-CM | POA: Diagnosis not present

## 2023-01-08 DIAGNOSIS — M546 Pain in thoracic spine: Secondary | ICD-10-CM

## 2023-01-08 LAB — CBC

## 2023-01-08 NOTE — Therapy (Signed)
OUTPATIENT PHYSICAL THERAPY LUMBAR AND THORACIC DISCHARGE PHYSICAL THERAPY DISCHARGE SUMMARY  Visits from Start of Care: 8  Current functional level related to goals / functional outcomes: See below   Remaining deficits: See below   Education / Equipment: See below   Patient agrees to discharge. Patient goals were partially met. Patient is being discharged due to being pleased with the current functional level.    Patient Name: Elijah Shaffer MRN: [id number] DOB:18-Apr-1981, 42 y.o., male Today's Date: 01/08/2023  END OF SESSION:    PT End of Session - 01/08/23 1033     Visit Number 8    Number of Visits 8    Date for PT Re-Evaluation 01/08/23    Authorization Type [employer]; no VL and no auth    Progress Note Due on Visit 8    PT Start Time 1032    PT Stop Time 1112    PT Time Calculation (min) 40 min             Past Medical History:  Diagnosis Date   Acute renal injury ([place]) 06/28/2017   Anxiety    Aspiration pneumonia of right upper lobe due to gastric secretions ([place])    Diverticulitis    GERD (gastroesophageal reflux disease) 01/08/2018   Seasonal allergies    Syncope 06/26/2021   Past Surgical History:  Procedure Laterality Date   BIOPSY  03/18/2021   Procedure: BIOPSY;  Surgeon: Harvel Quale, MD;  [facility];  Service: Gastroenterology;;  esophageal at [facility]   BIOPSY  05/23/2021   Procedure: BIOPSY;  Surgeon: Harvel Quale, MD;  [facility];  Service: Gastroenterology;;   CARDIOPULMONARY EXERCISE TEST (CPX)  01/09/2022   Interpretation limited by submaximal effort.  Severe functional impairment when compared to max sedentary norms.  No cardiopulmonary limitations.  Noted tremor and generalized weakness that lead to premature exercise termination-consider neuromuscular evaluation.   COLONOSCOPY N/A 09/22/2017   Procedure: COLONOSCOPY;  Surgeon: Rogene Houston, MD;  [facility];   Service: Endoscopy;  Laterality: N/A;  9:55   COLONOSCOPY WITH PROPOFOL N/A 05/23/2021   Procedure: COLONOSCOPY WITH PROPOFOL;  Surgeon: Harvel Quale, MD;  [facility];  Service: Gastroenterology;  Laterality: N/A;  7:30   ESOPHAGOGASTRODUODENOSCOPY (EGD) WITH PROPOFOL N/A 03/18/2021   Procedure: ESOPHAGOGASTRODUODENOSCOPY (EGD) WITH PROPOFOL;  Surgeon: Harvel Quale, MD;  [facility];  Service: Gastroenterology;  Laterality: N/A;   ESOPHAGOGASTRODUODENOSCOPY (EGD) WITH PROPOFOL N/A 05/23/2021   Procedure: ESOPHAGOGASTRODUODENOSCOPY (EGD) WITH PROPOFOL;  Surgeon: Harvel Quale, MD;  [facility];  Service: Gastroenterology;  Laterality: N/A;   POLYPECTOMY  09/22/2017   Procedure: POLYPECTOMY;  Surgeon: Rogene Houston, MD;  [facility];  Service: Endoscopy;;   RIGHT/LEFT HEART CATH AND CORONARY ANGIOGRAPHY N/A 12/12/2021   Procedure: RIGHT/LEFT HEART CATH AND CORONARY ANGIOGRAPHY;  Surgeon: Jolaine Artist, MD;  Location: [facility];  Service: Cardiovascular:: Normal Coronaries & Hemodynamics.  EF 50-55%. RA = 4 mmHg, RV = 23/4; PA = 22/6 (14) & PCW = 7; Ao sat = 95%; PA sat = 76%, 76%& SVC sat = 74%Fick cardiac output/index = 7.1/3.0; PVR = 1.0 WU   TRANSTHORACIC ECHOCARDIOGRAM  02/24/2021   EF 60 to 65%.  Normal LV function.  Normal valves.  Mild RV dilation with normal function.   ZIO PATCH EVENT MONITOR  01/2022   Sinus rhythm with heart rate range 24 to 159  bpm, 1  AVB and Wenckebach block noted along with rare PACs and PVCs.  No arrhythmias.   Patient Active Problem List   Diagnosis Date Noted   Acute diarrhea 01/06/2023   Abdominal pain 01/06/2023   Erythrocytosis 12/03/2022   Encounter for general adult medical examination with abnormal findings 12/02/2022   OSA (obstructive sleep apnea) 12/02/2022   Urinary retention 11/05/2022   Cervical spinal stenosis 07/29/2022   DDD (degenerative  disc disease), lumbar 07/29/2022   NASH (nonalcoholic steatohepatitis) 06/10/2022   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/2022   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   PSVT (paroxysmal supraventricular tachycardia) 03/25/2022   Itching 03/07/2022   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   Bloating 03/07/2022   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   [facility] spotted fever 12/31/2021   Encounter for examination following treatment at hospital 12/31/2021   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   Chronic sinusitis 12/04/2021   Mixed hyperlipidemia 11/30/2021   Esophageal hiatal hernia 11/22/2021   Irritable bowel syndrome with constipation 11/22/2021   SOB (shortness of breath) on exertion 06/28/2021   Anxiety 06/26/2021   Eczema 06/26/2021   Barrett's esophagus 04/11/2021   History of colonic polyps 04/11/2021   Focal neurological deficit 02/23/2021   GERD (gastroesophageal reflux disease) 01/08/2018    PCP: Ihor Dow, MD  REFERRING PROVIDER: Judith Part, MD  REFERRING DIAG: M41.9 (ICD-10-CM) - Scoliosis, unspecified  THERAPY DIAG:  Low back pain, unspecified back pain laterality, unspecified chronicity, unspecified whether sciatica present  Pain in thoracic spine  Rationale for Evaluation and Treatment: Rehabilitation  ONSET DATE: chronic; worse over the last year  SUBJECTIVE:   SUBJECTIVE STATEMENT: Patient states he went to the ED earlier this week; had a stomach infection.  Back is not hurting today but he has not been able to do much due to other medical issues.  Generally tired and not feeling well the past few days although he is better this morning.  His back is "100%" better although he has not returned to  working    Eval:Back pain has gotten worse over the last year; owns his own [employer].  PCP referred to [facility].  MRI done of the thoracic spine; referred to therapy; he also goes to chiropractor some; Dr. Bethann Goo.  Reports  weak feeling in legs  PERTINENT HISTORY: Last year [facility] spotted fever with subsequent  tremors and passing out Large hiatal hernia. See above PAIN:  Are you having pain? No  PRECAUTIONS: None  WEIGHT BEARING RESTRICTIONS: No  FALLS:  Has patient fallen in last 6 months? No  LIVING ENVIRONMENT: Lives with: lives with their family Lives in: House/apartment Stairs: Yes: External: 6 steps; on right going up, on left going up, and can reach both Has following equipment at home: None  OCCUPATION: Tree surgeon maintenance  PLOF: Independent  PATIENT GOALS: help my back feel better  NEXT MD VISIT: Dr. Jabier Mutton (neurologist)  OBJECTIVE:   DIAGNOSTIC FINDINGS: IMPRESSION: 1. Mild degenerative changes of the thoracic spine without high-grade spinal canal or neural foraminal stenosis at any level. 2. Exaggerated thoracic kyphosis related to minimal chronic compression fractures, as detailed above. 3. Large hiatal hernia.  IMPRESSION: 1. Moderate right foraminal encroachment due to spurring and disc protrusion. Slight cord flattening on the right due to spurring. 2. Otherwise no significant spinal stenosis or cord compression.     Electronically Signed   By: Pedro Earls M.D.   On: 09/06/2022 14:22  PATIENT SURVEYS:  Modified Oswestry 15/50 30%   COGNITION: Overall cognitive status: Within functional limits for tasks assessed     SENSATION: Reports occasional numbness right thigh   POSTURE: rounded shoulders, forward head, and increased thoracic kyphosis slight right side rib hump; significant kyphosis thoracic spine  PALPATION: General tenderness of the back on palpation  LOWER EXTREMITY MMT:  MMT Right eval Left eval Right 01/08/23 Left 01/08/23  Hip flexion 4+* 5 4+* 5  Hip extension 4 4+ 4+ 5  Hip abduction      Hip adduction      Hip internal rotation      Hip external rotation      Knee flexion 5 4+ 5 5  Knee  extension 5* 5 5* 5  Ankle dorsiflexion '5 5 5 5  '$ Ankle plantarflexion      Ankle inversion      Ankle eversion       (Blank rows = not tested)   LUMBAR ROM:   Active  A/PROM  eval AROM 01/08/23  Flexion 85% available * 90% available feels tight  Extension 50% available* 70% available  Right lateral flexion To knee   Left lateral flexion To just above knee   Right rotation    Left rotation     (Blank rows = not tested)   FUNCTIONAL TESTS:  5 times sit to stand: 18.80 Sec  TODAY'S TREATMENT:                                                                                                                              DATE:  01/08/23 Progress note Modified oswestry 4/50 = 8% 5 x sit to stand 13.05 sec MMT's and AROM see above   Prone: Prone press up 2" x 10  Supine: LTR x 10 Hip adduction with ball and bridge x 10 Hip abduction with belt and bridge; 3" hold x 10 Dead bug x 10    01-26-2023 Supine: LTR x 2' Hip adduction with ball with bridge x 10 Hip abduction with GTB with bridge 3" hold x 10 Dead bug 2 x 10  Sidelying Clam GTB  2 x 10 each  Prone: Press ups 2" x 10  Sitting: Isometric abdominal sets 5" 2 x 10 with physioball Hamstring stretch with strap 5 x 20" each     12/31/22 Supine: LTR x 2' Hooklying hip adduction with ball 5" hold x 10 Hip abduction with GTB with bridge 3" hold x 10  Sidelying: Clam GTB 2 x 10 each way  Prone: Press ups 10" x 10  Seated: Abdominal isometrics 5" hold 2 x 10 Hamstring stretch with strap 3 x 20"   12/27/22 Hooklying hip adduction x 3" x 10 x 2 Bridging with hip abd, GTB, 3" x 10 x 2 Sidelying clamshells, GTB x 10 x 2 on each Supine R sciatic nerve floss x 1' LTR x 1' Prone on elbows x 5" x 10 Prone  press ups x 5" x 10 Seated abdominal isometrics with physioball x 3" x 10 x 2 Seated hamstring stretch x 30" x 3 Standing Hip hinges x 10  12/25/22 Hooklying hip adduction x 3" x 10 x 2 Bridging x 10 x  2 Hooklying hip abd, RTB x 3" x 10 x 2 Supine R sciatic nerve floss x 1' LTR x 1' Prone on elbows x 5" x 10 Prone press ups x 5" x 10 Seated hamstring stretch x 30" x 3  12/20/22 Grade 1 long axis distraction on L hip x 30" x 3 followed by Grade 1 inf thrust on the last rep Hooklying hip adduction x 3" x 10 x 2 Bridging x 10 x 2 Hooklying hip abd isom with a belt x 3" x 10 x 2 Supine R sciatic nerve floss with pillow x 1' Prone on elbows x 5" x 10 Prone press ups x 5" x 10 Seated hamstring stretch x 30" x 3  12/17/2022 Review of HEP and goals Decompression exercises 5" hold x 8 each Head press Shoulder press Leg lengthener Leg press  12/11/22 physical therapy evaluation and HEP instruction    PATIENT EDUCATION:  Education details: updated HEP Education method: Explanation, Demonstration, and Handouts Education comprehension: verbalized understanding, returned demonstration, verbal cues required, and tactile cues required HOME EXERCISE PROGRAM: 01/02/23 hamstring stretch with strap, dead bug  Access Code: [email or web address] URL: [email or web address]  12/27/22 - Clamshell with Resistance  - 1-2 x daily - 7 x weekly - 2 sets - 10 reps - Supine Bridge with Resistance Band  - 1-2 x daily - 7 x weekly - 2 sets - 10 reps  12/25/22 - Lower Trunk Rotations  - 1-2 x daily - 7 x weekly  12/20/22 - Supine Bridge  - 1-2 x daily - 7 x weekly - 2 sets - 10 reps - Supine Hip Adduction Isometric with Ball  - 1-2 x daily - 7 x weekly - 2 sets - 10 reps - 3 hold - Hooklying Isometric Hip Abduction with Belt  - 1-2 x daily - 7 x weekly - 1-2 sets - 10 reps - 3 hold - Seated Hamstring Stretch  - 1-2 x daily - 7 x weekly - 3 reps - 30 hold - Supine Sciatic Nerve Mobilization With Leg on Pillow  - 1-2 x daily - 7 x weekly  12/17/2022 decompression exercises; hold all other HEP for now  Date: 12/11/2022 Prepared by: [facility]  Exercises - Static Prone on Elbows  - 4 x daily - 7 x weekly  - 1 sets - 1 reps - 2 minutes hold - Prone Press Up  - 4 x daily - 7 x weekly - 1 sets - 10 reps - Seated Thoracic Lumbar Extension  - 4 x daily - 7 x weekly - 1 sets - 10 reps - Doorway Pec Stretch at 90 Degrees Abduction  - 2 x daily - 7 x weekly - 1 sets - 5 reps - 20 sec hold  ASSESSMENT:  CLINICAL IMPRESSION: Progress note today.  Patient has met 2/2 short term and 3/4 long term set rehab goals.  He is pleased with current functional level and is agreeable to discharge at this time.   Eval:Patient is a 42 y.o. male who was seen today for physical therapy evaluation and treatment for M41.9 (ICD-10-CM) - Scoliosis, unspecified. Patient presents to physical therapy with complaint of back pain. Patient demonstrates muscle weakness, reduced ROM, and fascial restrictions which  are likely contributing to symptoms of pain and are negatively impacting patient ability to perform ADLs and functional mobility tasks. Patient will benefit from skilled physical therapy services to address these deficits to reduce pain and improve level of function with ADLs and functional mobility tasks.   OBJECTIVE IMPAIRMENTS: Abnormal gait, decreased activity tolerance, decreased knowledge of condition, decreased mobility, difficulty walking, decreased ROM, decreased strength, hypomobility, increased fascial restrictions, impaired perceived functional ability, impaired flexibility, postural dysfunction, and pain.   ACTIVITY LIMITATIONS: carrying, lifting, bending, standing, squatting, sleeping, reach over head, locomotion level, and caring for others  PARTICIPATION LIMITATIONS: meal prep, cleaning, laundry, shopping, community activity, occupation, and yard work  PERSONAL FACTORS: Profession are also affecting patient's functional outcome.   REHAB POTENTIAL: Good  CLINICAL DECISION MAKING: Stable/uncomplicated  EVALUATION COMPLEXITY: Low   GOALS: Goals reviewed with patient? No  SHORT TERM GOALS: Target  date: 12/25/2022 patient will be independent with initial HEP  Baseline: Goal status: MET  2.  Patient will self report 30% improvement to improve tolerance for functional activity   Baseline: 01/08/23 Goal status: MET   LONG TERM GOALS: Target date: 01/08/2023  Patient will be independent in self management strategies to improve quality of life and functional outcomes.   Baseline:  Goal status: MET  2.  Patient will improve modified Oswestry score by 10 points to demonstrate improved functional mobility Baseline: 15/50; 01/08/23 4/50  Goal status: MET  3.  Patient will increase bilateral leg MMTs to 5/5 without pain to promote return to ambulation community distances with minimal deviation.  Baseline: see above; 2/28 met all but right hip 4+/5 Goal status: IN PROGRESS  4.  Patient will self report 50% improvement to improve tolerance for functional activity  Baseline:  Goal status: MET   PLAN:  PT FREQUENCY: 2x/week  PT DURATION: 4 weeks  PLANNED INTERVENTIONS: Therapeutic exercises, Therapeutic activity, Neuromuscular re-education, Balance training, Gait training, Patient/Family education, Joint manipulation, Joint mobilization, Stair training, Orthotic/Fit training, DME instructions, Aquatic Therapy, Dry Needling, Electrical stimulation, Spinal manipulation, Spinal mobilization, Cryotherapy, Moist heat, Compression bandaging, scar mobilization, Splintting, Taping, Traction, Ultrasound, Ionotophoresis '4mg'$ /ml Dexamethasone, and Manual therapy   PLAN FOR NEXT SESSION:  discharge  11:12 AM, 01/08/23 Kamilah Correia Small Debarah Mccumbers MPT [place] [facility]  [phone] [id number]

## 2023-01-09 ENCOUNTER — Encounter: Payer: Self-pay | Admitting: Radiology

## 2023-01-09 LAB — H. PYLORI ANTIGEN, STOOL: H pylori Ag, Stl: NEGATIVE

## 2023-01-10 ENCOUNTER — Encounter: Payer: Self-pay | Admitting: Pulmonary Disease

## 2023-01-10 NOTE — Assessment & Plan Note (Signed)
Improved now, but has it on intermittent basis On Propranolol

## 2023-01-10 NOTE — Progress Notes (Signed)
Established Patient Office Visit  Subjective:  Patient ID: Elijah Shaffer, male    DOB: 12/17/1980  Age: 42 y.o. MRN: [id number]  CC:  Chief Complaint  Patient presents with   Anxiety    Patient is laying in bed at night trembling    HPI Elijah Shaffer is a 42 y.o. male with past medical history of GERD, eczema, anxiety and chronic dyspnea who presents for f/u of his chronic medical conditions.  IBS: He had diarrhea for the last 2 days.  He was seen by NP, and was given ciprofloxacin and Imodium.  He has taken 1 dose of ciprofloxacin so far.  He was advised by GI specialist to avoid taking antibiotic until stool studies.  He states that he had 1 formed BM today.  Denies any abdominal pain currently.  He usually has chronic constipation and takes Amitiza for it.  Denies any melena or hematochezia.  Anxiety: He takes Prozac and Cymbalta currently.  He still has episodes of anxiety and tremors.  He was placed on clonazepam by Dr. Merlene Laughter, but was discontinued by [facility].    Past Medical History:  Diagnosis Date   Acute renal injury ([place]) 06/28/2017   Anxiety    Aspiration pneumonia of right upper lobe due to gastric secretions ([place])    Diverticulitis    GERD (gastroesophageal reflux disease) 01/08/2018   Seasonal allergies    Syncope 06/26/2021    Past Surgical History:  Procedure Laterality Date   BIOPSY  03/18/2021   Procedure: BIOPSY;  Surgeon: Harvel Quale, MD;  [facility];  Service: Gastroenterology;;  esophageal at [place]   BIOPSY  05/23/2021   Procedure: BIOPSY;  Surgeon: Harvel Quale, MD;  [facility];  Service: Gastroenterology;;   CARDIOPULMONARY EXERCISE TEST (CPX)  01/09/2022   Interpretation limited by submaximal effort.  Severe functional impairment when compared to max sedentary norms.  No cardiopulmonary limitations.  Noted tremor and generalized weakness that lead to premature exercise  termination-consider neuromuscular evaluation.   COLONOSCOPY N/A 09/22/2017   Procedure: COLONOSCOPY;  Surgeon: Rogene Houston, MD;  [facility];  Service: Endoscopy;  Laterality: N/A;  9:55   COLONOSCOPY WITH PROPOFOL N/A 05/23/2021   Procedure: COLONOSCOPY WITH PROPOFOL;  Surgeon: Harvel Quale, MD;  [facility];  Service: Gastroenterology;  Laterality: N/A;  7:30   ESOPHAGOGASTRODUODENOSCOPY (EGD) WITH PROPOFOL N/A 03/18/2021   Procedure: ESOPHAGOGASTRODUODENOSCOPY (EGD) WITH PROPOFOL;  Surgeon: Harvel Quale, MD;  [facility];  Service: Gastroenterology;  Laterality: N/A;   ESOPHAGOGASTRODUODENOSCOPY (EGD) WITH PROPOFOL N/A 05/23/2021   Procedure: ESOPHAGOGASTRODUODENOSCOPY (EGD) WITH PROPOFOL;  Surgeon: Harvel Quale, MD;  [facility];  Service: Gastroenterology;  Laterality: N/A;   POLYPECTOMY  09/22/2017   Procedure: POLYPECTOMY;  Surgeon: Rogene Houston, MD;  [facility];  Service: Endoscopy;;   RIGHT/LEFT HEART CATH AND CORONARY ANGIOGRAPHY N/A 12/12/2021   Procedure: RIGHT/LEFT HEART CATH AND CORONARY ANGIOGRAPHY;  Surgeon: Jolaine Artist, MD;  Location: [place] [facility];  Service: Cardiovascular:: Normal Coronaries & Hemodynamics.  EF 50-55%. RA = 4 mmHg, RV = 23/4; PA = 22/6 (14) & PCW = 7; Ao sat = 95%; PA sat = 76%, 76%& SVC sat = 74%Fick cardiac output/index = 7.1/3.0; PVR = 1.0 WU   TRANSTHORACIC ECHOCARDIOGRAM  02/24/2021   EF 60 to 65%.  Normal LV function.  Normal valves.  Mild RV dilation with normal function.  ZIO PATCH EVENT MONITOR  01/2022   Sinus rhythm with heart rate range 24 to 159 bpm, 1  AVB and Wenckebach block noted along with rare PACs and PVCs.  No arrhythmias.    Family History  Problem Relation Age of Onset   Healthy Mother    Cervical cancer Mother    Cancer Father    Colon cancer Maternal Grandmother    Colon cancer Maternal Grandfather      Social History   Socioeconomic History   Marital status: Single    Spouse name: Not on file   Number of children: Not on file   Years of education: Not on file   Highest education level: Not on file  Occupational History   Not on file  Tobacco Use   Smoking status: Never    Passive exposure: Past   Smokeless tobacco: Never  Vaping Use   Vaping Use: Never used  Substance and Sexual Activity   Alcohol use: Yes    Comment: occ   Drug use: No   Sexual activity: Not on file  Other Topics Concern   Not on file  Social History Narrative   He currently has his own lawn care landscaping service. He previous had 80 yards before he got sick and since decreased. He works around a [employer]. One of the chemical names is roundup.    Social Determinants of Health   Financial Resource Strain: Not on file  Food Insecurity: No Food Insecurity (12/27/2022)   Hunger Vital Sign    Worried About Running Out of Food in the Last Year: Never true    Ran Out of Food in the Last Year: Never true  Transportation Needs: No Transportation Needs (12/27/2022)   PRAPARE - Hydrologist (Medical): No    Lack of Transportation (Non-Medical): No  Physical Activity: Insufficiently Active (03/29/2022)   Exercise Vital Sign    Days of Exercise per Week: 2 days    Minutes of Exercise per Session: 10 min  Stress: Stress Concern Present (05/07/2022)   [place]    Feeling of Stress : To some extent  Social Connections: Not on file  Intimate Partner Violence: Not At Risk (12/27/2022)   Humiliation, Afraid, Rape, and Kick questionnaire    Fear of Current or Ex-Partner: No    Emotionally Abused: No    Physically Abused: No    Sexually Abused: No    Outpatient Medications Prior to Visit  Medication Sig Dispense Refill   albuterol (VENTOLIN HFA) 108 (90 Base) MCG/ACT inhaler Inhale 2 puffs into the lungs  every 6 (six) hours as needed for shortness of breath. 8 g 1   aspirin EC 81 MG tablet Take 81 mg by mouth daily. Swallow whole.     b complex vitamins capsule Take 1 capsule by mouth daily. With D3 gummie     betamethasone dipropionate 0.05 % lotion Apply 1-2 times daily to affected itchy areas at scalp. Avoid applying to face, groin, and axilla. Use as directed. Long-term use can cause thinning of the skin. 60 mL 2   cetirizine (ZYRTEC) 10 MG tablet Take 10 mg by mouth daily.     Cholecalciferol (VITAMIN D-3 PO) Take by mouth.     ciprofloxacin (CIPRO) 500 MG tablet Take 1 tablet (500 mg total) by mouth 2 (two) times daily for 7 days. (Patient not taking: Reported on 01/06/2023) 14 tablet 0   clindamycin (  CLEOCIN T) 1 % external solution Apply topically to acne bumps at aa's qd 30 mL 3   clobetasol (TEMOVATE) 0.05 % external solution Apply 1 application. topically 2 (two) times daily. Use as directed.  Mix with cerave cream. Use for up to 4 weeks. Avoid applying to face, groin, and axilla. Use as directed. 50 mL 1   clobetasol cream (TEMOVATE) 0.05 % Apply small amount daily before bed to right ankle or other areas of body as needed for itchy rash Avoid applying to face, groin, and axilla Use as directed. 30 g 0   cyclobenzaprine (FLEXERIL) 5 MG tablet Take 1 tablet (5 mg total) by mouth at bedtime as needed for muscle spasms. 30 tablet 1   diphenhydrAMINE-zinc acetate (BENADRYL EXTRA STRENGTH) cream Apply 1 application topically 3 (three) times daily as needed for itching. 28.4 g 0   docusate sodium (COLACE) 100 MG capsule Take 100 mg by mouth 2 (two) times daily.     DULoxetine (CYMBALTA) 30 MG capsule Take 60 mg by mouth daily.     Dupilumab (DUPIXENT) 300 MG/2ML SOPN Inject 300 mg into the skin every 14 (fourteen) days. Starting at day 15 for maintenance. 4 mL 5   Fluocinolone Acetonide 0.01 % OIL Use 1 - 2 drop qd/bid prn for scale at ears 20 mL 2   fluocinonide (LIDEX) 0.05 % external  solution Apply once or twice daily to affected areas on scalp up to 2 weeks as needed for itching. Avoid applying to face, groin, and axilla. 60 mL 2   fluticasone (FLONASE ALLERGY RELIEF) 50 MCG/ACT nasal spray Place 2 sprays into both nostrils daily as needed for allergies or rhinitis. (Patient taking differently: Place 2 sprays into both nostrils daily.) 9.9 mL 2   fluticasone-salmeterol (ADVAIR) 250-50 MCG/ACT AEPB as needed.     gabapentin (NEURONTIN) 300 MG capsule Take 300 mg by mouth 3 (three) times daily.     guaiFENesin (MUCINEX) 600 MG 12 hr tablet Take by mouth.     hydrOXYzine (VISTARIL) 25 MG capsule Take 1 capsule (25 mg total) by mouth every 8 (eight) hours as needed. Take before bed as needed for itchy. Can cause drowsiness. 60 capsule 2   ketoconazole (NIZORAL) 2 % shampoo Apply 1 Application topically daily as needed for irritation. 120 mL 11   lubiprostone (AMITIZA) 8 MCG capsule Take 1 capsule (8 mcg total) by mouth 2 (two) times daily with a meal. 180 capsule 3   montelukast (SINGULAIR) 10 MG tablet TAKE 1 TABLET BY MOUTH AT BEDTIME 30 tablet 1   naproxen (NAPROSYN) 500 MG tablet Take 500 mg by mouth 2 (two) times daily.     omega-3 acid ethyl esters (LOVAZA) 1 g capsule Take 2 capsules (2 g total) by mouth 2 (two) times daily. 120 capsule 3   omeprazole (PRILOSEC) 40 MG capsule Take 1 capsule (40 mg total) by mouth 2 (two) times daily. 180 capsule 3   ondansetron (ZOFRAN) 4 MG tablet Take 1 tablet (4 mg total) by mouth every 6 (six) hours as needed for nausea. 20 tablet 0   potassium gluconate (RA POTASSIUM GLUCONATE) 595 (99 K) MG TABS tablet Take 1 tablet by mouth daily.     propranolol (INDERAL) 20 MG tablet Take one tablet 20 mg  twice daily  may take an  additional dose of 20 mg  as needed for palpitations 190 tablet 3   rosuvastatin (CRESTOR) 40 MG tablet Take 1 tablet (40 mg total) by mouth daily.  90 tablet 3   senna (SENOKOT) 8.6 MG tablet Take 1 tablet by mouth daily.  prn     tacrolimus (PROTOPIC) 0.1 % ointment APPLY TOPICALLY TO AFFECTED AREAS OF BODY FOR RASH DAILY OR TWICE DAILY AS NEEDED 30 g 0   triamcinolone cream (KENALOG) 0.1 %      B Complex-Folic Acid (B COMPLEX VITAMINS, W/ FA,) CAPS Take by mouth.     clonazePAM (KLONOPIN) 0.5 MG tablet Take 0.5 mg by mouth daily.     FLUoxetine (PROZAC) 20 MG capsule Take 1 capsule (20 mg total) by mouth daily. 90 capsule 1   hydrOXYzine (ATARAX) 25 MG tablet Take 25 mg by mouth daily at 6 (six) AM.     loperamide (IMODIUM A-D) 2 MG tablet Take 1 tablet (2 mg total) by mouth 4 (four) times daily as needed for diarrhea or loose stools. 30 tablet 0   pantoprazole (PROTONIX) 40 MG tablet Take 40 mg by mouth daily.     No facility-administered medications prior to visit.    Allergies  Allergen Reactions   Gramineae Pollens Itching   Claritin [Loratadine] Other (See Comments)    Heart race   Loratadine-Pseudoephedrine Er Palpitations   Pollen Extract Other (See Comments)    Seasonal allergies    ROS Review of Systems  Constitutional:  Positive for fatigue. Negative for chills and fever.  HENT:  Negative for congestion and sore throat.   Eyes:  Negative for pain and discharge.  Respiratory:  Positive for shortness of breath. Negative for cough.   Cardiovascular:  Positive for palpitations. Negative for chest pain.  Gastrointestinal:  Positive for diarrhea. Negative for nausea and vomiting.  Endocrine: Negative for polydipsia and polyuria.  Genitourinary:  Negative for dysuria and hematuria.  Musculoskeletal:  Positive for arthralgias, back pain, myalgias and neck pain. Negative for neck stiffness.  Neurological:  Positive for dizziness and tremors. Negative for headaches.  Psychiatric/Behavioral:  Positive for sleep disturbance. Negative for agitation and behavioral problems. The patient is nervous/anxious.       Objective:    Physical Exam Vitals reviewed.  Constitutional:      General: He is  not in acute distress.    Appearance: He is not diaphoretic.  HENT:     Head: Normocephalic and atraumatic.     Nose: Nose normal.     Mouth/Throat:     Mouth: Mucous membranes are moist.  Eyes:     General: No scleral icterus.    Extraocular Movements: Extraocular movements intact.  Cardiovascular:     Rate and Rhythm: Normal rate and regular rhythm.     Pulses: Normal pulses.     Heart sounds: Normal heart sounds. No murmur heard. Pulmonary:     Breath sounds: Normal breath sounds. No wheezing or rales.  Abdominal:     General: Bowel sounds are normal.     Palpations: Abdomen is soft.     Tenderness: There is no abdominal tenderness.  Musculoskeletal:        General: Tenderness (Mid thoracic and lumbar spine area) present.     Cervical back: Neck supple. No tenderness.     Right lower leg: No edema.     Left lower leg: No edema.  Skin:    General: Skin is warm.     Coloration: Skin is not jaundiced.  Neurological:     General: No focal deficit present.     Mental Status: He is alert and oriented to person, place, and time.  Cranial Nerves: No cranial nerve deficit.     Sensory: No sensory deficit.     Motor: No weakness.     Comments: Functional tremors on right hand  Psychiatric:        Mood and Affect: Mood is anxious and depressed. Affect is flat.        Thought Content: Thought content does not include homicidal or suicidal ideation.     BP 121/80 (BP Location: Right Arm, Patient Position: Sitting, Cuff Size: Large)   Pulse 77   Ht '6\' 2"'$  (1.88 m)   Wt 262 lb 3.2 oz (118.9 kg)   SpO2 96%   BMI 33.66 kg/m  Wt Readings from Last 3 Encounters:  01/07/23 262 lb 3.2 oz (118.9 kg)  01/06/23 261 lb 9.6 oz (118.7 kg)  01/06/23 262 lb 5.6 oz (119 kg)    Lab Results  Component Value Date   TSH 3.110 12/02/2022   Lab Results  Component Value Date   WBC 11.8 (H) 01/07/2023   HGB 16.1 01/07/2023   HCT 47.6 01/07/2023   MCV 89 01/07/2023   PLT 291 01/07/2023    Lab Results  Component Value Date   NA 137 01/07/2023   K 4.3 01/07/2023   CO2 18 (L) 01/07/2023   GLUCOSE 88 01/07/2023   BUN 16 01/07/2023   CREATININE 1.40 (H) 01/07/2023   BILITOT 0.6 12/02/2022   ALKPHOS 76 12/02/2022   AST 34 12/02/2022   ALT 49 (H) 12/02/2022   PROT 7.4 12/02/2022   ALBUMIN 4.6 12/02/2022   CALCIUM 9.9 01/07/2023   ANIONGAP 10 12/27/2021   EGFR 65 01/07/2023   GFR 68.05 10/02/2021   Lab Results  Component Value Date   CHOL 266 (H) 12/02/2022   Lab Results  Component Value Date   HDL 31 (L) 12/02/2022   Lab Results  Component Value Date   LDLCALC Comment (A) 12/02/2022   Lab Results  Component Value Date   TRIG 987 (HH) 12/02/2022   Lab Results  Component Value Date   CHOLHDL 8.6 (H) 12/02/2022   Lab Results  Component Value Date   HGBA1C 6.0 (H) 12/02/2022      Assessment & Plan:   Problem List Items Addressed This Visit       Digestive   Irritable bowel syndrome with constipation    Usually has chronic constipation Takes Amitiza, advised to hold for now as he has diarrhea      Acute diarrhea    Advised to avoid Ciprofloxacin and loperamide for now Check stool GI profile CT abdomen has been scheduled GI visit note reviewed Advised to maintain adequate hydration        Other   Anxiety - Primary (Chronic)    Well-controlled now Had increased dose of Prozac, 20 mg QD now Hydroxyzine as needed for anxiety      Relevant Medications   FLUoxetine (PROZAC) 20 MG capsule   Psychogenic tremor    Improved now, but has it on intermittent basis On Propranolol       Meds ordered this encounter  Medications   FLUoxetine (PROZAC) 20 MG capsule    Sig: Take 1 capsule (20 mg total) by mouth daily.    Dispense:  90 capsule    Refill:  1    Follow-up: Return if symptoms worsen or fail to improve.    Lindell Spar, MD

## 2023-01-10 NOTE — Assessment & Plan Note (Signed)
Usually has chronic constipation Takes Amitiza, advised to hold for now as he has diarrhea

## 2023-01-10 NOTE — Assessment & Plan Note (Signed)
Well-controlled now Had increased dose of Prozac, 20 mg QD now Hydroxyzine as needed for anxiety

## 2023-01-10 NOTE — Assessment & Plan Note (Signed)
Advised to avoid Ciprofloxacin and loperamide for now Check stool GI profile CT abdomen has been scheduled GI visit note reviewed Advised to maintain adequate hydration

## 2023-01-11 LAB — GI PROFILE, STOOL, PCR

## 2023-01-11 LAB — BASIC METABOLIC PANEL
BUN/Creatinine Ratio: 11 (ref 9–20)
BUN: 16 mg/dL (ref 6–24)
CO2: 18 mmol/L — ABNORMAL LOW (ref 20–29)
Calcium: 9.9 mg/dL (ref 8.7–10.2)
Chloride: 104 mmol/L (ref 96–106)
Creatinine, Ser: 1.4 mg/dL — ABNORMAL HIGH (ref 0.76–1.27)
Glucose: 88 mg/dL (ref 70–99)
Potassium: 4.3 mmol/L (ref 3.5–5.2)
Sodium: 137 mmol/L (ref 134–144)
eGFR: 65 mL/min/{1.73_m2} (ref >59)

## 2023-01-11 LAB — CBC
Hematocrit: 47.6 % (ref 37.5–51.0)
Hemoglobin: 16.1 g/dL (ref 13.0–17.7)
MCH: 30 pg (ref 26.6–33.0)
MCHC: 33.8 g/dL (ref 31.5–35.7)
MCV: 89 fL (ref 79–97)
Platelets: 291 10*3/uL (ref 150–450)
RBC: 5.36 x10E6/uL (ref 4.14–5.80)
RDW: 13.1 % (ref 11.6–15.4)
WBC: 11.8 10*3/uL — ABNORMAL HIGH (ref 3.4–10.8)

## 2023-01-11 LAB — LIPASE: Lipase: 52 U/L (ref 13–78)

## 2023-01-14 ENCOUNTER — Encounter: Payer: Self-pay | Admitting: Dermatology

## 2023-01-14 ENCOUNTER — Telehealth: Payer: Self-pay

## 2023-01-14 ENCOUNTER — Ambulatory Visit: Payer: 59 | Admitting: Dermatology

## 2023-01-14 VITALS — BP 143/94 | HR 66

## 2023-01-14 DIAGNOSIS — Z79899 Other long term (current) drug therapy: Secondary | ICD-10-CM | POA: Diagnosis not present

## 2023-01-14 DIAGNOSIS — L209 Atopic dermatitis, unspecified: Secondary | ICD-10-CM

## 2023-01-14 DIAGNOSIS — L219 Seborrheic dermatitis, unspecified: Secondary | ICD-10-CM | POA: Diagnosis not present

## 2023-01-14 MED ORDER — DUPIXENT 300 MG/2ML ~~LOC~~ SOSY: 300.0000 mg | PREFILLED_SYRINGE | SUBCUTANEOUS | 5 refills | Status: DC

## 2023-01-14 MED ORDER — DUPILUMAB 300 MG/2ML ~~LOC~~ SOSY
300.0000 mg | PREFILLED_SYRINGE | Freq: Once | SUBCUTANEOUS | Status: AC
  Administered 2023-01-14: 300 mg via SUBCUTANEOUS

## 2023-01-14 NOTE — Telephone Encounter (Signed)
Called patient regarding Elijah Shaffer and Dupixent start. Patient was started on Dupixent injections 12/16/22. Paperwork and rx was documented to be sent to [facility]. Patient reports he has not heard anything from [facility] regarding [place].   [name] and they stated they had no information on patient. So resubmitted the rx to [facility]. Filled out Dupixent myway forms to fax. Need patient signature in order to fax.   Left message for patient to call office regarding the need for his signature so forms can be faxed for dupixent.

## 2023-01-14 NOTE — Patient Instructions (Addendum)
For itchy scaly areas on ears  Start  betamethasone dipropionate 0.05% lotion apply to itchy areas at ears once a day to affected areas of ear use no more than 1 week.   Continue fluocinolone oil 1-2 drops daily to ears as needed for itch.  Avoid picking at areas  Dupilumab ([place]) is a treatment given by injection for adults and children with moderate-to-severe atopic dermatitis. Goal is control of skin condition, not cure. It is given as 2 injections at the first dose followed by 1 injection ever 2 weeks thereafter.  Young children are dosed monthly.  Potential side effects include allergic reaction, herpes infections, injection site reactions and conjunctivitis (inflammation of the eyes).  The use of Dupixent requires long term medication management, including periodic office visits.    Due to recent changes in healthcare laws, you may see results of your pathology and/or laboratory studies on MyChart before the doctors have had a chance to review them. We understand that in some cases there may be results that are confusing or concerning to you. Please understand that not all results are received at the same time and often the doctors may need to interpret multiple results in order to provide you with the best plan of care or course of treatment. Therefore, we ask that you please give [place] 2 business days to thoroughly review all your results before contacting the office for clarification. Should we see a critical lab result, you will be contacted sooner.   If You Need Anything After Your Visit  If you have any questions or concerns for your doctor, please call our main line at [phone] and press option 4 to reach your doctor's medical assistant. If no one answers, please leave a voicemail as directed and we will return your call as soon as possible. Messages left after 4 pm will be answered the following business day.   You may also send [place] a message via [place]. We typically respond  to MyChart messages within 1-2 business days.  For prescription refills, please ask your pharmacy to contact our office. Our fax number is [phone].  If you have an urgent issue when the clinic is closed that cannot wait until the next business day, you can page your doctor at the number below.    Please note that while we do our best to be available for urgent issues outside of office hours, we are not available 24/7.   If you have an urgent issue and are unable to reach [place], you may choose to seek medical care at your doctor's office, retail clinic, urgent care center, or emergency room.  If you have a medical emergency, please immediately call 911 or go to the emergency department.  Pager Numbers  - Dr. Nehemiah Massed: [phone]  - Dr. Laurence Ferrari: [phone]  - Dr. Nicole Kindred: [phone]  In the event of inclement weather, please call our main line at [phone] for an update on the status of any delays or closures.  Dermatology Medication Tips: Please keep the boxes that topical medications come in in order to help keep track of the instructions about where and how to use these. Pharmacies typically print the medication instructions only on the boxes and not directly on the medication tubes.   If your medication is too expensive, please contact our office at [phone] option 4 or send [place] a message through [place].   We are unable to tell what your co-pay for medications will be in advance as this is different depending  on your insurance coverage. However, we may be able to find a substitute medication at lower cost or fill out paperwork to get insurance to cover a needed medication.   If a prior authorization is required to get your medication covered by your insurance company, please allow [place] 1-2 business days to complete this process.  Drug prices often vary depending on where the prescription is filled and some pharmacies may offer cheaper prices.  The website www.goodrx.com  contains coupons for medications through different pharmacies. The prices here do not account for what the cost may be with help from insurance (it may be cheaper with your insurance), but the website can give you the price if you did not use any insurance.  - You can print the associated coupon and take it with your prescription to the pharmacy.  - You may also stop by our office during regular business hours and pick up a GoodRx coupon card.  - If you need your prescription sent electronically to a different pharmacy, notify our office through [facility] or by phone at [phone] option 4.     Si Usted Necesita Algo Despus de Su Visita  Tambin puede enviarnos un mensaje a travs de Pharmacist, community. Por lo general respondemos a los mensajes de MyChart en el transcurso de 1 a 2 das hbiles.  Para renovar recetas, por favor pida a su farmacia que se ponga en contacto con nuestra oficina. Harland Dingwall de fax es [place] [phone].  Si tiene un asunto urgente cuando la clnica est cerrada y que no puede esperar hasta el siguiente da hbil, puede llamar/localizar a su doctor(a) al nmero que aparece a continuacin.   Por favor, tenga en cuenta que aunque hacemos todo lo posible para estar disponibles para asuntos urgentes fuera del horario de [place], no estamos disponibles las 24 horas del da, los 7 das de la [place].   Si tiene un problema urgente y no puede comunicarse con nosotros, puede optar por buscar atencin mdica  en el consultorio de su doctor(a), en una clnica privada, en un centro de atencin urgente o en una sala de emergencias.  Si tiene Engineering geologist, por favor llame inmediatamente al 911 o vaya a la sala de emergencias.  Nmeros de bper  - Dr. Nehemiah Massed: [phone]  - Dra. Moye: [phone]  - Dra. Nicole Kindred: [phone]  En caso de inclemencias del [place], por favor llame a Johnsie Kindred principal al [phone] para una actualizacin sobre el [place]  de cualquier retraso o cierre.  Consejos para la medicacin en dermatologa: Por favor, guarde las cajas en las que vienen los medicamentos de uso tpico para ayudarle a seguir las instrucciones sobre dnde y cmo usarlos. Las farmacias generalmente imprimen las instrucciones del medicamento slo en las cajas y no directamente en los tubos del [place].   Si su medicamento es muy caro, por favor, pngase en contacto con Zigmund Daniel llamando al [phone] y presione la opcin 4 o envenos un mensaje a travs de Pharmacist, community.   No podemos decirle cul ser su copago por los medicamentos por adelantado ya que esto es diferente dependiendo de la cobertura de su seguro. Sin embargo, es posible que podamos encontrar un medicamento sustituto a Electrical engineer un formulario para que el seguro cubra el medicamento que se considera necesario.   Si se requiere una autorizacin previa para que su compaa de seguros [place] su medicamento, por favor permtanos de 1 a 2 das hbiles para completar este proceso.  Los precios de los medicamentos varan con frecuencia dependiendo del Environmental consultant de dnde se surte la receta y alguna farmacias pueden ofrecer precios ms baratos.  El sitio web www.goodrx.com tiene cupones para medicamentos de Airline pilot. Los precios aqu no tienen en cuenta lo que podra costar con la ayuda del seguro (puede ser ms barato con su seguro), pero el sitio web puede darle el precio si no utiliz Research scientist (physical sciences).  - Puede imprimir el cupn correspondiente y llevarlo con su receta a la farmacia.  - Tambin puede pasar por nuestra oficina durante el horario de atencin regular y Charity fundraiser una tarjeta de cupones de GoodRx.  - Si necesita que su receta se enve electrnicamente a una farmacia diferente, informe a nuestra oficina a travs de MyChart de [place] o por telfono llamando al [phone] y presione la opcin 4.

## 2023-01-14 NOTE — Progress Notes (Signed)
Follow-Up Visit   Subjective  Elijah Shaffer is a 42 y.o. male who presents for the following: Eczema (1 month follow up, hx of flares at trunk, hands, fingers, face and scalp.  Patient started on dupixent injections. Reports he is less itchy since starting dupixent. Denies any side effects from dupixents. Still has some itchy areas inside ears. ).  Oil drops not helping.   The following portions of the chart were reviewed this encounter and updated as appropriate:      Review of Systems: No other skin or systemic complaints except as noted in HPI or Assessment and Plan.   Objective  Well appearing patient in no apparent distress; mood and affect are within normal limits.  A focused examination was performed including b/l ears, scalp. Relevant physical exam findings are noted in the Assessment and Plan.  trunk, hands, fingers, face, scalp Scaliness in left ear canal  and crusted excoriation in left upper antihelix, arms, legs clear with mild xerosis   Assessment & Plan  Atopic dermatitis, unspecified type trunk, hands, fingers, face, scalp   Chronic and persistent condition with duration or expected duration over one year. Condition is symptomatic / bothersome to patient. Not to goal. Patient's itching has improved significantly on the Dupixent   Atopic dermatitis - Severe, on Dupixent (biologic medication).  Atopic dermatitis (eczema) is a chronic, relapsing, pruritic condition that can significantly affect quality of life. It is often associated with allergic rhinitis and/or asthma and can require treatment with topical medications, phototherapy, or in severe cases biologic medications, which require long term medication management.    Patient was started on dupixent 12/16/22, still has not heard from [place]. Resubmitted rx today to [place] form filled out after pt visit today and awaiting patient signature. Patient contacted but no answer. Left message for  him to return call.   Dupixent 300 mg/mL sample given to left upper arm today. Patient tolerated well with no adverse reactions Lot [id number] Exp 02/2025 [place] [id number]   Dupilumab (Dupixent) is a treatment given by injection for adults and children with moderate-to-severe atopic dermatitis. Goal is control of skin condition, not cure. It is given as 2 injections at the first dose followed by 1 injection ever 2 weeks thereafter.  Young children are dosed monthly.   Potential side effects include allergic reaction, herpes infections, injection site reactions and conjunctivitis (inflammation of the eyes).  The use of Dupixent requires long term medication management, including periodic office visits.    Recommend mild soap and moisturizing cream 1-2 times daily.  Gentle skin care handout provided.    Continue clobetasol 0.05% cream to itchy spots at legs 1-2 times daily for up to 2 weeks as needed for itchy flares. Avoid applying to face, groin, and axilla. Use as directed. Long-term use can cause thinning of the skin. Continue Clob/CeraVe cream mix to aas body qd/bid prn itch Continue tacrolimus ointment 1-2 times daily. Continue hydroxyzine 25 mg at bedtime as needed for itch. May cause drowsiness.  Continue betamethasone dipropionate 0.05% lotion 1-2 times daily to affected areas at scalp as needed for itch. May use for ears qd prn itch for up to a week Continue ketoconazole 2% shampoo apply three times per week, massage into scalp and leave in for 5-10 minutes before rinsing out. Continue fluocinolone oil 1-2 drops daily to ears as needed for itch.  Avoid picking  Topical steroids (such as triamcinolone, fluocinolone, fluocinonide, mometasone, clobetasol, halobetasol, betamethasone, hydrocortisone) can cause  thinning and lightening of the skin if they are used for too long in the same area. Your physician has selected the right strength medicine for your problem and area affected on the body.  Please use your medication only as directed by your physician to prevent side effects.   dupilumab (DUPIXENT) prefilled syringe 300 mg - trunk, hands, fingers, face, scalp   dupilumab (DUPIXENT) 300 MG/2ML prefilled syringe - trunk, hands, fingers, face, scalp Inject 300 mg into the skin every 14 (fourteen) days. Starting at day 15 for maintenance.  Related Medications clobetasol cream (TEMOVATE) 0.05 % Apply small amount daily before bed to right ankle or other areas of body as needed for itchy rash Avoid applying to face, groin, and axilla Use as directed.  tacrolimus (PROTOPIC) 0.1 % ointment APPLY TOPICALLY TO AFFECTED AREAS OF BODY FOR RASH DAILY OR TWICE DAILY AS NEEDED   Return for 2 week nurse visit for dupixent , 4 week atopic derm follow up.  Documentation: I have reviewed the above documentation for accuracy and completeness, and I agree with the above.  [name] MD

## 2023-01-15 MED ORDER — FLUOCINOLONE ACETONIDE 0.01 % OT OIL: TOPICAL_OIL | OTIC | 2 refills | Status: DC

## 2023-01-15 MED ORDER — BETAMETHASONE DIPROPIONATE 0.05 % EX LOTN: TOPICAL_LOTION | CUTANEOUS | 2 refills | Status: DC

## 2023-01-15 NOTE — Addendum Note (Signed)
Addended by: Ruthell Rummage A on: 01/15/2023 08:08 AM   Modules accepted: Orders

## 2023-01-21 ENCOUNTER — Telehealth: Payer: Self-pay | Admitting: Pharmacist

## 2023-01-21 DIAGNOSIS — E782 Mixed hyperlipidemia: Secondary | ICD-10-CM

## 2023-01-21 MED ORDER — FENOFIBRATE 145 MG PO TABS: 145.0000 mg | ORAL_TABLET | Freq: Every day | ORAL | 3 refills | Status: DC

## 2023-01-21 NOTE — Telephone Encounter (Signed)
Call to check on Crestor tolerability.  TG level warrants addition of Tricor.  N/A, LVM and sent MyChart MSG   Patient called back. Reports tolerating Crestor well. Will initiate Tricor 145 mg daily, repeat FLP and LFT in 3 months ( lab has been ordered)  Patient to go for lab around mid June.

## 2023-01-22 ENCOUNTER — Ambulatory Visit (HOSPITAL_BASED_OUTPATIENT_CLINIC_OR_DEPARTMENT_OTHER): Payer: 59 | Attending: Pulmonary Disease | Admitting: Radiology

## 2023-01-22 ENCOUNTER — Ambulatory Visit (HOSPITAL_COMMUNITY): Payer: 59

## 2023-01-22 DIAGNOSIS — G4733 Obstructive sleep apnea (adult) (pediatric): Secondary | ICD-10-CM

## 2023-01-22 NOTE — Progress Notes (Unsigned)
Elijah Shaffer, [place] [address]   CLINIC:  Medical Oncology/Hematology  PCP:  Lindell Spar, MD [address] [place] [address] [phone]   REASON FOR VISIT:  Follow-up for erythrocytosis  CURRENT THERAPY: Under workup  INTERVAL HISTORY:   Mr. Elijah Shaffer 42 y.o. male returns for routine follow-up of erythrocytosis.  He was seen for initial consultation by Dr. Delton Coombes on 12/27/2022.  At today's visit, he reports feeling fairly well.  Although he has some general itchiness from his dermatitis, he denies any aquagenic pruritus.  No vasomotor symptoms or B symptoms.  His hemoglobin has normalized since his last visit, and he reports that he has been using his CPAP nightly for the past few weeks.  He does also report an episode of isolated rectal bleeding a few weeks ago.  He denies any obvious exposure to carbon monoxide.  He has 80% energy and 100% appetite. He endorses that he is maintaining a stable weight.  ASSESSMENT & PLAN:  1.  Erythrocytosis -Mild erythrocytosis first noted in January 2024 with CBC/D (12/02/2022) showing Hgb 18.1/HCT 52.5%.  Repeat CBC/D (12/23/2022) showed Hgb 18.2/HCT 53.8%. - CTAP (02/23/2021) showed normal-appearing spleen - Non-smoker.   He denies any sources of carbon monoxide exposure.    - No prior history of thrombosis. - He is not on testosterone supplementation.  He does not take any diuretics. - No aquagenic pruritus, vasomotor symptoms, or B symptoms. - Patient has obstructive sleep apnea, but had had not been wearing his CPAP for 2 to 3 months prior to onset of his erythrocytosis.  Since that time, he has had new mask fitting and is using his CPAP nightly for the past 2 to 3 weeks. - Hematology workup (12/27/2022): Negative for mutations of JAK2, CALR, MPL, Exons 12-15 Normal LDH, normal erythropoietin 11.6 - CBC (12/27/2022): Normal Hgb 16.1/hematocrit 47.6 - Hemoglobin has normalized as per most recent  CBC, which may be related to his improved CPAP compliance or his isolated episode of rectal bleeding  - DIFFERENTIAL DIAGNOSIS of transient erythrocytosis favors secondary polycythemia from noncompliance with CPAP in the setting of obstructive sleep apnea - PLAN: Discussed with patient that primary treatment of secondary erythrocytosis is aimed at underlying cause.  Recommend improved compliance with CPAP. - No indication for therapeutic phlebotomy unless severely symptomatic or HCT >54.0% - We will recheck labs and see patient for follow-up visit in 6 months.  If hemoglobin remains normal over the next 6 to 12 months, would consider discharge to PCP. - Would consider bone marrow biopsy if unexplained elevations in hemoglobin or expansion to other cell lines  2.  Other history - Following with neurology for tremors - Follows with dermatology for atopic dermatitis, receiving Dupixent injections - Sleep apnea, noncompliant with CPAP - History of upper GI bleeding/hematemesis from [place] lesion in his stomach (EGD 03/18/2021) - Other PMH: GERD complicated by Barrett's esophagus, anxiety, chronic dyspnea - SOCIAL:  He has lawn care and landscaping business.  He is a non-smoker.  Exposure to Roundup present. - FAMILY: No family history of polycythemia vera.  Mother had cervical cancer.  Maternal grandmother and maternal grandfather had cancers.  Father had cancer.  PLAN SUMMARY: >> Labs (CBC/D) in 6 months >> OFFICE visit after labs     REVIEW OF SYSTEMS:   Review of Systems  Constitutional:  Negative for appetite change, chills, diaphoresis, fatigue, fever and unexpected weight change.  HENT:   Negative for lump/mass  and nosebleeds.   Eyes:  Negative for eye problems.  Respiratory:  Negative for cough, hemoptysis and shortness of breath.   Cardiovascular:  Negative for chest pain, leg swelling and palpitations.  Gastrointestinal:  Negative for abdominal pain, blood in stool, constipation,  diarrhea, nausea and vomiting.  Genitourinary:  Negative for hematuria.   Skin: Negative.   Neurological:  Negative for dizziness, headaches and light-headedness.  Hematological:  Does not bruise/bleed easily.     PHYSICAL EXAM:  ECOG PERFORMANCE STATUS: 0 - Asymptomatic  There were no vitals filed for this visit. There were no vitals filed for this visit. Physical Exam Constitutional:      Appearance: Normal appearance. He is obese.  Cardiovascular:     Heart sounds: Normal heart sounds.  Pulmonary:     Breath sounds: Normal breath sounds.  Neurological:     General: No focal deficit present.     Mental Status: Mental status is at baseline.  Psychiatric:        Behavior: Behavior normal. Behavior is cooperative.    PAST MEDICAL/SURGICAL HISTORY:  Past Medical History:  Diagnosis Date   Acute renal injury ([facility]) 06/28/2017   Anxiety    Aspiration pneumonia of right upper lobe due to gastric secretions ([place])    Diverticulitis    GERD (gastroesophageal reflux disease) 01/08/2018   Seasonal allergies    Syncope 06/26/2021   Past Surgical History:  Procedure Laterality Date   BIOPSY  03/18/2021   Procedure: BIOPSY;  Surgeon: Harvel Quale, MD;  [facility];  Service: Gastroenterology;;  esophageal at [facility]   BIOPSY  05/23/2021   Procedure: BIOPSY;  Surgeon: Harvel Quale, MD;  [facility];  Service: Gastroenterology;;   CARDIOPULMONARY EXERCISE TEST (CPX)  01/09/2022   Interpretation limited by submaximal effort.  Severe functional impairment when compared to max sedentary norms.  No cardiopulmonary limitations.  Noted tremor and generalized weakness that lead to premature exercise termination-consider neuromuscular evaluation.   COLONOSCOPY N/A 09/22/2017   Procedure: COLONOSCOPY;  Surgeon: Rogene Houston, MD;  [facility];  Service: Endoscopy;  Laterality: N/A;  9:55   COLONOSCOPY WITH PROPOFOL N/A 05/23/2021    Procedure: COLONOSCOPY WITH PROPOFOL;  Surgeon: Harvel Quale, MD;  [facility];  Service: Gastroenterology;  Laterality: N/A;  7:30   ESOPHAGOGASTRODUODENOSCOPY (EGD) WITH PROPOFOL N/A 03/18/2021   Procedure: ESOPHAGOGASTRODUODENOSCOPY (EGD) WITH PROPOFOL;  Surgeon: Harvel Quale, MD;  [facility];  Service: Gastroenterology;  Laterality: N/A;   ESOPHAGOGASTRODUODENOSCOPY (EGD) WITH PROPOFOL N/A 05/23/2021   Procedure: ESOPHAGOGASTRODUODENOSCOPY (EGD) WITH PROPOFOL;  Surgeon: Harvel Quale, MD;  [facility];  Service: Gastroenterology;  Laterality: N/A;   POLYPECTOMY  09/22/2017   Procedure: POLYPECTOMY;  Surgeon: Rogene Houston, MD;  [facility];  Service: Endoscopy;;   RIGHT/LEFT HEART CATH AND CORONARY ANGIOGRAPHY N/A 12/12/2021   Procedure: RIGHT/LEFT HEART CATH AND CORONARY ANGIOGRAPHY;  Surgeon: Jolaine Artist, MD;  Location: [facility];  Service: Cardiovascular:: Normal Coronaries & Hemodynamics.  EF 50-55%. RA = 4 mmHg, RV = 23/4; PA = 22/6 (14) & PCW = 7; Ao sat = 95%; PA sat = 76%, 76%& SVC sat = 74%Fick cardiac output/index = 7.1/3.0; PVR = 1.0 WU   TRANSTHORACIC ECHOCARDIOGRAM  02/24/2021   EF 60 to 65%.  Normal LV function.  Normal valves.  Mild RV dilation with normal function.   ZIO PATCH EVENT MONITOR  01/2022  Sinus rhythm with heart rate range 24 to 159 bpm, 1  AVB and Wenckebach block noted along with rare PACs and PVCs.  No arrhythmias.    SOCIAL HISTORY:  Social History   Socioeconomic History   Marital status: Single    Spouse name: Not on file   Number of children: Not on file   Years of education: Not on file   Highest education level: Not on file  Occupational History   Not on file  Tobacco Use   Smoking status: Never    Passive exposure: Past   Smokeless tobacco: Never  Vaping Use   Vaping Use: Never used  Substance and Sexual Activity   Alcohol use: Yes     Comment: occ   Drug use: No   Sexual activity: Not on file  Other Topics Concern   Not on file  Social History Narrative   He currently has his own lawn care landscaping service. He previous had 80 yards before he got sick and since decreased. He works around a [employer]. One of the chemical names is roundup.    Social Determinants of Health   Financial Resource Strain: Not on file  Food Insecurity: No Food Insecurity (12/27/2022)   Hunger Vital Sign    Worried About Running Out of Food in the Last Year: Never true    Ran Out of Food in the Last Year: Never true  Transportation Needs: No Transportation Needs (12/27/2022)   PRAPARE - Hydrologist (Medical): No    Lack of Transportation (Non-Medical): No  Physical Activity: Insufficiently Active (03/29/2022)   Exercise Vital Sign    Days of Exercise per Week: 2 days    Minutes of Exercise per Session: 10 min  Stress: Stress Concern Present (05/07/2022)   [place]    Feeling of Stress : To some extent  Social Connections: Not on file  Intimate Partner Violence: Not At Risk (12/27/2022)   Humiliation, Afraid, Rape, and Kick questionnaire    Fear of Current or Ex-Partner: No    Emotionally Abused: No    Physically Abused: No    Sexually Abused: No    FAMILY HISTORY:  Family History  Problem Relation Age of Onset   Healthy Mother    Cervical cancer Mother    Cancer Father    Colon cancer Maternal Grandmother    Colon cancer Maternal Grandfather     CURRENT MEDICATIONS:  Outpatient Encounter Medications as of 01/23/2023  Medication Sig   albuterol (VENTOLIN HFA) 108 (90 Base) MCG/ACT inhaler Inhale 2 puffs into the lungs every 6 (six) hours as needed for shortness of breath.   aspirin EC 81 MG tablet Take 81 mg by mouth daily. Swallow whole.   b complex vitamins capsule Take 1 capsule by mouth daily. With D3 gummie    betamethasone dipropionate 0.05 % lotion Apply 1-2 times daily to affected itchy areas at scalp. Avoid applying to face, groin, and axilla. Use as directed. Long-term use can cause thinning of the skin.   cetirizine (ZYRTEC) 10 MG tablet Take 10 mg by mouth daily.   Cholecalciferol (VITAMIN D-3 PO) Take by mouth.   clindamycin (CLEOCIN T) 1 % external solution Apply topically to acne bumps at aa's qd   clobetasol (TEMOVATE) 0.05 % external solution Apply 1 application. topically 2 (two) times daily. Use as directed.  Mix with cerave cream. Use for up to 4 weeks. Avoid  applying to face, groin, and axilla. Use as directed.   clobetasol cream (TEMOVATE) 0.05 % Apply small amount daily before bed to right ankle or other areas of body as needed for itchy rash Avoid applying to face, groin, and axilla Use as directed.   cyclobenzaprine (FLEXERIL) 5 MG tablet Take 1 tablet (5 mg total) by mouth at bedtime as needed for muscle spasms.   diphenhydrAMINE-zinc acetate (BENADRYL EXTRA STRENGTH) cream Apply 1 application topically 3 (three) times daily as needed for itching.   docusate sodium (COLACE) 100 MG capsule Take 100 mg by mouth 2 (two) times daily.   DULoxetine (CYMBALTA) 30 MG capsule Take 60 mg by mouth daily.   dupilumab (DUPIXENT) 300 MG/2ML prefilled syringe Inject 300 mg into the skin every 14 (fourteen) days. Starting at day 15 for maintenance.   Dupilumab (DUPIXENT) 300 MG/2ML SOPN Inject 300 mg into the skin every 14 (fourteen) days. Starting at day 15 for maintenance.   fenofibrate (TRICOR) 145 MG tablet Take 1 tablet (145 mg total) by mouth daily.   Fluocinolone Acetonide 0.01 % OIL Use 1 - 2 drop qd/bid prn for scale at ears   fluocinonide (LIDEX) 0.05 % external solution Apply once or twice daily to affected areas on scalp up to 2 weeks as needed for itching. Avoid applying to face, groin, and axilla.   FLUoxetine (PROZAC) 20 MG capsule Take 1 capsule (20 mg total) by mouth daily.    fluticasone (FLONASE ALLERGY RELIEF) 50 MCG/ACT nasal spray Place 2 sprays into both nostrils daily as needed for allergies or rhinitis. (Patient taking differently: Place 2 sprays into both nostrils daily.)   fluticasone-salmeterol (ADVAIR) 250-50 MCG/ACT AEPB as needed.   gabapentin (NEURONTIN) 300 MG capsule Take 300 mg by mouth 3 (three) times daily.   guaiFENesin (MUCINEX) 600 MG 12 hr tablet Take by mouth.   hydrOXYzine (VISTARIL) 25 MG capsule Take 1 capsule (25 mg total) by mouth every 8 (eight) hours as needed. Take before bed as needed for itchy. Can cause drowsiness.   ketoconazole (NIZORAL) 2 % shampoo Apply 1 Application topically daily as needed for irritation.   lubiprostone (AMITIZA) 8 MCG capsule Take 1 capsule (8 mcg total) by mouth 2 (two) times daily with a meal.   montelukast (SINGULAIR) 10 MG tablet TAKE 1 TABLET BY MOUTH AT BEDTIME   naproxen (NAPROSYN) 500 MG tablet Take 500 mg by mouth 2 (two) times daily.   omega-3 acid ethyl esters (LOVAZA) 1 g capsule Take 2 capsules (2 g total) by mouth 2 (two) times daily.   omeprazole (PRILOSEC) 40 MG capsule Take 1 capsule (40 mg total) by mouth 2 (two) times daily.   ondansetron (ZOFRAN) 4 MG tablet Take 1 tablet (4 mg total) by mouth every 6 (six) hours as needed for nausea.   potassium gluconate (RA POTASSIUM GLUCONATE) 595 (99 K) MG TABS tablet Take 1 tablet by mouth daily.   propranolol (INDERAL) 20 MG tablet Take one tablet 20 mg  twice daily  may take an  additional dose of 20 mg  as needed for palpitations   rosuvastatin (CRESTOR) 40 MG tablet Take 1 tablet (40 mg total) by mouth daily.   senna (SENOKOT) 8.6 MG tablet Take 1 tablet by mouth daily. prn   tacrolimus (PROTOPIC) 0.1 % ointment APPLY TOPICALLY TO AFFECTED AREAS OF BODY FOR RASH DAILY OR TWICE DAILY AS NEEDED   triamcinolone cream (KENALOG) 0.1 %    No facility-administered encounter medications on file as of  01/23/2023.    ALLERGIES:  Allergies  Allergen  Reactions   Gramineae Pollens Itching   Claritin [Loratadine] Other (See Comments)    Heart race   Loratadine-Pseudoephedrine Er Palpitations   Pollen Extract Other (See Comments)    Seasonal allergies    LABORATORY DATA:  I have reviewed the labs as listed.  CBC    Component Value Date/Time   WBC 11.8 (H) 01/07/2023 1556   WBC 10.4 12/27/2022 1042   RBC 5.36 01/07/2023 1556   RBC 5.41 12/27/2022 1042   HGB 16.1 01/07/2023 1556   HCT 47.6 01/07/2023 1556   PLT 291 01/07/2023 1556   MCV 89 01/07/2023 1556   MCH 30.0 01/07/2023 1556   MCH 30.1 12/27/2022 1042   MCHC 33.8 01/07/2023 1556   MCHC 34.2 12/27/2022 1042   RDW 13.1 01/07/2023 1556   LYMPHSABS 3.3 12/27/2022 1042   LYMPHSABS 2.7 12/02/2022 0859   MONOABS 0.9 12/27/2022 1042   EOSABS 0.1 12/27/2022 1042   EOSABS 0.1 12/02/2022 0859   BASOSABS 0.1 12/27/2022 1042   BASOSABS 0.1 12/02/2022 0859      Latest Ref Rng & Units 01/07/2023    3:56 PM 12/02/2022    8:59 AM 12/27/2021    2:43 PM  CMP  Glucose 70 - 99 mg/dL 88  108  83   BUN 6 - 24 mg/dL '16  16  19   '$ Creatinine 0.76 - 1.27 mg/dL 1.40  1.75  1.31   Sodium 134 - 144 mmol/L 137  139  134   Potassium 3.5 - 5.2 mmol/L 4.3  5.3  3.8   Chloride 96 - 106 mmol/L 104  99  104   CO2 20 - 29 mmol/L '18  21  20   '$ Calcium 8.7 - 10.2 mg/dL 9.9  10.4  9.2   Total Protein 6.0 - 8.5 g/dL  7.4    Total Bilirubin 0.0 - 1.2 mg/dL  0.6    Alkaline Phos 44 - 121 IU/L  76    AST 0 - 40 IU/L  34    ALT 0 - 44 IU/L  49      DIAGNOSTIC IMAGING:  I have independently reviewed the relevant imaging and discussed with the patient.   WRAP UP:  All questions were answered. The patient knows to call the clinic with any problems, questions or concerns.  Medical decision making: Moderate  Time spent on visit: I spent 20 minutes counseling the patient face to face. The total time spent in the appointment was 30 minutes and more than 50% was on counseling.  Harriett Rush,  PA-C  01/23/2023 8:40 AM

## 2023-01-23 ENCOUNTER — Inpatient Hospital Stay: Payer: 59 | Attending: Hematology | Admitting: Physician Assistant

## 2023-01-23 ENCOUNTER — Other Ambulatory Visit: Payer: Self-pay

## 2023-01-23 VITALS — BP 131/83 | HR 60 | Temp 98.2°F | Resp 18 | Ht 75.0 in | Wt 242.0 lb

## 2023-01-23 DIAGNOSIS — G4733 Obstructive sleep apnea (adult) (pediatric): Secondary | ICD-10-CM | POA: Diagnosis not present

## 2023-01-23 DIAGNOSIS — R251 Tremor, unspecified: Secondary | ICD-10-CM | POA: Diagnosis not present

## 2023-01-23 DIAGNOSIS — D751 Secondary polycythemia: Secondary | ICD-10-CM | POA: Insufficient documentation

## 2023-01-23 DIAGNOSIS — F419 Anxiety disorder, unspecified: Secondary | ICD-10-CM | POA: Insufficient documentation

## 2023-01-23 DIAGNOSIS — K2270 Barrett's esophagus without dysplasia: Secondary | ICD-10-CM | POA: Insufficient documentation

## 2023-01-23 DIAGNOSIS — L209 Atopic dermatitis, unspecified: Secondary | ICD-10-CM | POA: Insufficient documentation

## 2023-01-23 DIAGNOSIS — K219 Gastro-esophageal reflux disease without esophagitis: Secondary | ICD-10-CM | POA: Insufficient documentation

## 2023-01-23 DIAGNOSIS — Z800 Family history of malignant neoplasm of digestive organs: Secondary | ICD-10-CM | POA: Diagnosis not present

## 2023-01-23 DIAGNOSIS — R0600 Dyspnea, unspecified: Secondary | ICD-10-CM | POA: Diagnosis not present

## 2023-01-23 NOTE — Patient Instructions (Signed)
[facility] [place] [facility] **   You were seen today by Tarri Abernethy PA-C for your follow-up visit.    ELEVATED RED BLOOD CELLS Your labs did not show any genetic mutations or cancerous causes of elevated red blood cells. Your elevated hemoglobin/elevated red blood cells are most likely related to your sleep apnea.  It is extremely important that you use your CPAP every night as recommended by sleep medicine specialists and primary care provider.  FOLLOW-UP APPOINTMENT: Labs in 6 months with follow-up visit after labs  ** Thank you for trusting me with your healthcare!  I strive to provide all of my patients with quality care at each visit.  If you receive a survey for this visit, I would be so grateful to you for taking the time to provide feedback.  Thank you in advance!  ~ Anthoni Geerts                   Dr. Derek Jack   &   Tarri Abernethy, PA-C   - - - - - - - - - - - - - - - - - -    Thank you for choosing [facility] to provide your oncology and hematology care.  To afford each patient quality time with our provider, please arrive at least 15 minutes before your scheduled appointment time.   If you have a lab appointment with the [facility] please come in thru the Main Entrance and check in at the main information desk.  You need to re-schedule your appointment should you arrive 10 or more minutes late.  We strive to give you quality time with our providers, and arriving late affects you and other patients whose appointments are after yours.  Also, if you no show three or more times for appointments you may be dismissed from the clinic at the providers discretion.     Again, thank you for choosing [facility].  Our hope is that these requests will decrease the amount of time that you wait before being seen by our physicians.        _____________________________________________________________  Should you have questions after your visit to [facility], please contact our office at [phone] and follow the prompts.  Our office hours are 8:00 a.m. and 4:30 p.m. Monday - Friday.  Please note that voicemails left after 4:00 p.m. may not be returned until the following business day.  We are closed weekends and major holidays.  You do have access to a nurse 24-7, just call the main number to the clinic [phone] and do not press any options, hold on the line and a nurse will answer the phone.    For prescription refill requests, have your pharmacy contact our office and allow 72 hours.

## 2023-01-24 NOTE — Telephone Encounter (Signed)
3/21 appointment cancelled. Follow up lab can be discussed over the phone.

## 2023-01-26 ENCOUNTER — Other Ambulatory Visit: Payer: Self-pay | Admitting: Pulmonary Disease

## 2023-01-26 DIAGNOSIS — J4530 Mild persistent asthma, uncomplicated: Secondary | ICD-10-CM

## 2023-01-28 ENCOUNTER — Ambulatory Visit: Payer: 59

## 2023-01-28 DIAGNOSIS — L209 Atopic dermatitis, unspecified: Secondary | ICD-10-CM

## 2023-01-28 MED ORDER — DUPILUMAB 300 MG/2ML ~~LOC~~ SOSY
300.0000 mg | PREFILLED_SYRINGE | Freq: Once | SUBCUTANEOUS | Status: AC
  Administered 2023-01-28: 300 mg via SUBCUTANEOUS

## 2023-01-28 NOTE — Progress Notes (Signed)
Patient here today for 2 week Dupixent injection for Severe Atopic Dermatitis.   Dupixent 300mg  injected into patients right upper arm. Patient tolerated procedure well.    Patient already scheduled to come back and see Dr .Nicole Kindred in 2 weeks for next injection and followup.   [id number] Exp: 02/2025 [id number]   Elijah Shaffer

## 2023-01-30 ENCOUNTER — Ambulatory Visit: Payer: 59

## 2023-02-05 ENCOUNTER — Other Ambulatory Visit: Payer: 59

## 2023-02-06 ENCOUNTER — Encounter: Payer: Self-pay | Admitting: Urology

## 2023-02-06 ENCOUNTER — Ambulatory Visit: Payer: 59 | Admitting: Urology

## 2023-02-06 ENCOUNTER — Telehealth: Payer: Self-pay | Admitting: Family Medicine

## 2023-02-06 VITALS — BP 127/82 | HR 67 | Ht 75.0 in | Wt 242.0 lb

## 2023-02-06 DIAGNOSIS — Z125 Encounter for screening for malignant neoplasm of prostate: Secondary | ICD-10-CM

## 2023-02-06 DIAGNOSIS — E291 Testicular hypofunction: Secondary | ICD-10-CM

## 2023-02-06 DIAGNOSIS — R5383 Other fatigue: Secondary | ICD-10-CM | POA: Diagnosis not present

## 2023-02-06 LAB — URINALYSIS, ROUTINE W REFLEX MICROSCOPIC
Bilirubin, UA: NEGATIVE
Glucose, UA: NEGATIVE
Ketones, UA: NEGATIVE
Leukocytes,UA: NEGATIVE
Nitrite, UA: NEGATIVE
RBC, UA: NEGATIVE
Specific Gravity, UA: 1.02 (ref 1.005–1.030)
Urobilinogen, Ur: 1 mg/dL (ref 0.2–1.0)
pH, UA: 7.5 (ref 5.0–7.5)

## 2023-02-06 LAB — MICROSCOPIC EXAMINATION
Bacteria, UA: NONE SEEN
RBC, Urine: NONE SEEN /hpf (ref 0–2)
WBC, UA: NONE SEEN /hpf (ref 0–5)

## 2023-02-06 NOTE — Progress Notes (Signed)
Subjective: 1. Hypogonadism in male   2. Other fatigue   3. Prostate cancer screening      Consult requested by Dr. Ihor Dow  Elijah Shaffer is a 42 yo male who is sent for low testosterone of 157 on his labs from 12/24/22.  He was having fatigue and reduced energy.  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  He doesn't report issues with reduced libido or erections but he is not active.  His Hgb was 18.2 on 12/23/22 but it was back down to 16.1 on 01/07/23 and that was felt to be related to improper use of his CPAP that he is on for OSA.  He is on gabapentin for sciatica.   He has had no recent weight loss.  His TSH was normal on 12/02/22.  He has CKD3 and hyperlipidemia.  His IPSS is 1. With nocturia x 1.   ROS:  Review of Systems  Constitutional:  Positive for malaise/fatigue.  HENT:  Positive for congestion.   Neurological:  Positive for tingling.    Allergies  Allergen Reactions   Gramineae Pollens Itching   Claritin [Loratadine] Other (See Comments)    Heart race   Loratadine-Pseudoephedrine Er Palpitations   Pollen Extract Other (See Comments)    Seasonal allergies    Past Medical History:  Diagnosis Date   Acute renal injury ([place]) 06/28/2017   Anxiety    Aspiration pneumonia of right upper lobe due to gastric secretions ([place])    Diverticulitis    GERD (gastroesophageal reflux disease) 01/08/2018   Seasonal allergies    Syncope 06/26/2021    Past Surgical History:  Procedure Laterality Date   BIOPSY  03/18/2021   Procedure: BIOPSY;  Surgeon: Harvel Quale, MD;  [facility];  Service: Gastroenterology;;  esophageal at [facility]   BIOPSY  05/23/2021   Procedure: BIOPSY;  Surgeon: Harvel Quale, MD;  [facility];  Service: Gastroenterology;;   CARDIOPULMONARY EXERCISE TEST (CPX)  01/09/2022   Interpretation limited by submaximal effort.  Severe functional impairment when  compared to max sedentary norms.  No cardiopulmonary limitations.  Noted tremor and generalized weakness that lead to premature exercise termination-consider neuromuscular evaluation.   COLONOSCOPY N/A 09/22/2017   Procedure: COLONOSCOPY;  Surgeon: Rogene Houston, MD;  [facility];  Service: Endoscopy;  Laterality: N/A;  9:55   COLONOSCOPY WITH PROPOFOL N/A 05/23/2021   Procedure: COLONOSCOPY WITH PROPOFOL;  Surgeon: Harvel Quale, MD;  [facility];  Service: Gastroenterology;  Laterality: N/A;  7:30   ESOPHAGOGASTRODUODENOSCOPY (EGD) WITH PROPOFOL N/A 03/18/2021   Procedure: ESOPHAGOGASTRODUODENOSCOPY (EGD) WITH PROPOFOL;  Surgeon: Harvel Quale, MD;  [facility];  Service: Gastroenterology;  Laterality: N/A;   ESOPHAGOGASTRODUODENOSCOPY (EGD) WITH PROPOFOL N/A 05/23/2021   Procedure: ESOPHAGOGASTRODUODENOSCOPY (EGD) WITH PROPOFOL;  Surgeon: Harvel Quale, MD;  [facility];  Service: Gastroenterology;  Laterality: N/A;   POLYPECTOMY  09/22/2017   Procedure: POLYPECTOMY;  Surgeon: Rogene Houston, MD;  [facility];  Service: Endoscopy;;   RIGHT/LEFT HEART CATH AND CORONARY ANGIOGRAPHY N/A 12/12/2021   Procedure: RIGHT/LEFT HEART CATH AND CORONARY ANGIOGRAPHY;  Surgeon: Jolaine Artist, MD;  Location: [facility];  Service: Cardiovascular:: Normal Coronaries & Hemodynamics.  EF 50-55%. RA = 4 mmHg, RV = 23/4; PA = 22/6 (14) & PCW = 7; Ao sat = 95%; PA sat = 76%, 76%& SVC  sat = 74%Fick cardiac output/index = 7.1/3.0; PVR = 1.0 WU   TRANSTHORACIC ECHOCARDIOGRAM  02/24/2021   EF 60 to 65%.  Normal LV function.  Normal valves.  Mild RV dilation with normal function.   ZIO PATCH EVENT MONITOR  01/2022   Sinus rhythm with heart rate range 24 to 159 bpm, 1  AVB and Wenckebach block noted along with rare PACs and PVCs.  No arrhythmias.    Social History   Socioeconomic History   Marital status:  Single    Spouse name: Not on file   Number of children: Not on file   Years of education: Not on file   Highest education level: Not on file  Occupational History   Not on file  Tobacco Use   Smoking status: Never    Passive exposure: Past   Smokeless tobacco: Never  Vaping Use   Vaping Use: Never used  Substance and Sexual Activity   Alcohol use: Yes    Comment: occ   Drug use: No   Sexual activity: Not on file  Other Topics Concern   Not on file  Social History Narrative   He currently has his own lawn care landscaping service. He previous had 80 yards before he got sick and since decreased. He works around a Technical [employer]. One of the chemical names is roundup.    Social Determinants of Health   Financial Resource Strain: Not on file  Food Insecurity: No Food Insecurity (12/27/2022)   Hunger Vital Sign    Worried About Running Out of Food in the Last Year: Never true    Ran Out of Food in the Last Year: Never true  Transportation Needs: No Transportation Needs (12/27/2022)   PRAPARE - Hydrologist (Medical): No    Lack of Transportation (Non-Medical): No  Physical Activity: Insufficiently Active (03/29/2022)   Exercise Vital Sign    Days of Exercise per Week: 2 days    Minutes of Exercise per Session: 10 min  Stress: Stress Concern Present (05/07/2022)   [place]    Feeling of Stress : To some extent  Social Connections: Not on file  Intimate Partner Violence: Not At Risk (12/27/2022)   Humiliation, Afraid, Rape, and Kick questionnaire    Fear of Current or Ex-Partner: No    Emotionally Abused: No    Physically Abused: No    Sexually Abused: No    Family History  Problem Relation Age of Onset   Healthy Mother    Cervical cancer Mother    Cancer Father    Colon cancer Maternal Grandmother    Colon cancer Maternal Grandfather     Anti-infectives: Anti-infectives  (From admission, onward)    None       Current Outpatient Medications  Medication Sig Dispense Refill   aspirin EC 81 MG tablet Take 81 mg by mouth daily. Swallow whole.     b complex vitamins capsule Take 1 capsule by mouth daily. With D3 gummie     betamethasone dipropionate 0.05 % lotion Apply 1-2 times daily to affected itchy areas at scalp. Avoid applying to face, groin, and axilla. Use as directed. Long-term use can cause thinning of the skin. 60 mL 2   cetirizine (ZYRTEC) 10 MG tablet Take 10 mg by mouth daily.     Cholecalciferol (VITAMIN D-3 PO) Take by mouth.     clindamycin (CLEOCIN T) 1 % external solution Apply topically  to acne bumps at aa's qd 30 mL 3   clobetasol (TEMOVATE) 0.05 % external solution Apply 1 application. topically 2 (two) times daily. Use as directed.  Mix with cerave cream. Use for up to 4 weeks. Avoid applying to face, groin, and axilla. Use as directed. 50 mL 1   clobetasol cream (TEMOVATE) 0.05 % Apply small amount daily before bed to right ankle or other areas of body as needed for itchy rash Avoid applying to face, groin, and axilla Use as directed. 30 g 0   cyclobenzaprine (FLEXERIL) 5 MG tablet Take 1 tablet (5 mg total) by mouth at bedtime as needed for muscle spasms. 30 tablet 1   diphenhydrAMINE-zinc acetate (BENADRYL EXTRA STRENGTH) cream Apply 1 application topically 3 (three) times daily as needed for itching. 28.4 g 0   docusate sodium (COLACE) 100 MG capsule Take 100 mg by mouth 2 (two) times daily.     DULoxetine (CYMBALTA) 30 MG capsule Take 60 mg by mouth daily.     dupilumab (DUPIXENT) 300 MG/2ML prefilled syringe Inject 300 mg into the skin every 14 (fourteen) days. Starting at day 15 for maintenance. 4 mL 5   Dupilumab (DUPIXENT) 300 MG/2ML SOPN Inject 300 mg into the skin every 14 (fourteen) days. Starting at day 15 for maintenance. 4 mL 5   fenofibrate (TRICOR) 145 MG tablet Take 1 tablet (145 mg total) by mouth daily. 90 tablet 3    Fluocinolone Acetonide 0.01 % OIL Use 1 - 2 drop qd/bid prn for scale at ears 20 mL 2   fluocinonide (LIDEX) 0.05 % external solution Apply once or twice daily to affected areas on scalp up to 2 weeks as needed for itching. Avoid applying to face, groin, and axilla. 60 mL 2   FLUoxetine (PROZAC) 20 MG capsule Take 1 capsule (20 mg total) by mouth daily. 90 capsule 1   fluticasone (FLONASE ALLERGY RELIEF) 50 MCG/ACT nasal spray Place 2 sprays into both nostrils daily as needed for allergies or rhinitis. (Patient taking differently: Place 2 sprays into both nostrils daily.) 9.9 mL 2   fluticasone-salmeterol (ADVAIR) 250-50 MCG/ACT AEPB as needed.     gabapentin (NEURONTIN) 300 MG capsule Take 300 mg by mouth 3 (three) times daily.     guaiFENesin (MUCINEX) 600 MG 12 hr tablet Take by mouth.     hydrOXYzine (VISTARIL) 25 MG capsule Take 1 capsule (25 mg total) by mouth every 8 (eight) hours as needed. Take before bed as needed for itchy. Can cause drowsiness. 60 capsule 2   ketoconazole (NIZORAL) 2 % shampoo Apply 1 Application topically daily as needed for irritation. 120 mL 11   lubiprostone (AMITIZA) 8 MCG capsule Take 1 capsule (8 mcg total) by mouth 2 (two) times daily with a meal. 180 capsule 3   montelukast (SINGULAIR) 10 MG tablet Take 1 tablet (10 mg total) by mouth at bedtime. 30 tablet 5   naproxen (NAPROSYN) 500 MG tablet Take 500 mg by mouth 2 (two) times daily.     omega-3 acid ethyl esters (LOVAZA) 1 g capsule Take 2 capsules (2 g total) by mouth 2 (two) times daily. 120 capsule 3   omeprazole (PRILOSEC) 40 MG capsule Take 1 capsule (40 mg total) by mouth 2 (two) times daily. 180 capsule 3   ondansetron (ZOFRAN) 4 MG tablet Take 1 tablet (4 mg total) by mouth every 6 (six) hours as needed for nausea. 20 tablet 0   potassium gluconate (RA POTASSIUM GLUCONATE) 595 (99 K)  MG TABS tablet Take 1 tablet by mouth daily.     propranolol (INDERAL) 20 MG tablet Take one tablet 20 mg  twice daily   may take an  additional dose of 20 mg  as needed for palpitations 190 tablet 3   rosuvastatin (CRESTOR) 40 MG tablet Take 1 tablet (40 mg total) by mouth daily. 90 tablet 3   senna (SENOKOT) 8.6 MG tablet Take 1 tablet by mouth daily. prn     tacrolimus (PROTOPIC) 0.1 % ointment APPLY TOPICALLY TO AFFECTED AREAS OF BODY FOR RASH DAILY OR TWICE DAILY AS NEEDED 30 g 0   triamcinolone cream (KENALOG) 0.1 %      albuterol (VENTOLIN HFA) 108 (90 Base) MCG/ACT inhaler INHALE 2 PUFFS BY MOUTH EVERY 6 HOURS AS NEEDED FOR SHORTNESS OF BREATH 9 g 0   No current facility-administered medications for this visit.     Objective: Vital signs in last 24 hours: BP 127/82   Pulse 67   Ht 6\' 3"  (1.905 m)   Wt 242 lb (109.8 kg)   BMI 30.25 kg/m   Intake/Output from previous day: No intake/output data recorded. Intake/Output this shift: @IOTHISSHIFT @   Physical Exam Vitals reviewed.  Constitutional:      Appearance: Normal appearance.  Cardiovascular:     Rate and Rhythm: Normal rate and regular rhythm.     Heart sounds: Normal heart sounds.  Pulmonary:     Effort: Pulmonary effort is normal. No respiratory distress.     Breath sounds: Normal breath sounds.  Abdominal:     General: Abdomen is flat.     Palpations: Abdomen is soft.     Tenderness: There is no abdominal tenderness.     Hernia: No hernia is present.  Genitourinary:    Penis: Normal.      Testes: Normal.     Prostate: Normal.     Rectum: Normal.     Comments: Prostate 1.5+  No testicular atrophy Musculoskeletal:        General: No swelling or tenderness. Normal range of motion.     Cervical back: Normal range of motion and neck supple.  Skin:    General: Skin is warm and dry.  Neurological:     General: No focal deficit present.     Mental Status: He is alert and oriented to person, place, and time.  Psychiatric:        Mood and Affect: Mood normal.        Behavior: Behavior normal.     Lab Results:  No results  found for this or any previous visit (from the past 24 hour(s)).  BMET No results for input(s): "NA", "K", "CL", "CO2", "GLUCOSE", "BUN", "CREATININE", "CALCIUM" in the last 72 hours. PT/INR No results for input(s): "LABPROT", "INR" in the last 72 hours. ABG No results for input(s): "PHART", "HCO3" in the last 72 hours.  Invalid input(s): "PCO2", "PO2" UA has 1+ protein.  Labs reviewed as noted above.  Studies/Results: No results found. Prior office noted reviewed.  Prior CT reviewed.   Assessment/Plan: Hypogonadism with fatigue.  I will do additional testing and call him with the results.  If the T remains low and there is no obvious correctable cause, we can consider TRT.  Erythrocytosis.  He had a transient erythrocytosis and will need that monitored closely if we do consider TRT.  Proteinuria.  He has CKD 3 which is the probably culprit for the protein.   No orders of the defined types were  placed in this encounter.    Orders Placed This Encounter  Procedures   Microscopic Examination   Urinalysis, Routine w reflex microscopic   PSA, total and free   Testosterone Free, Profile I   Follicle stimulating hormone   Luteinizing hormone   Estradiol   Prolactin   Testosterone Free, Profile I     Return for I will call with the lab results and arrange f/u accordingly. .    CC: Dr. Kathrine Cords     Irine Seal 02/07/2023

## 2023-02-06 NOTE — Telephone Encounter (Signed)
The CT you ordered was denied by Mr [employer].

## 2023-02-07 ENCOUNTER — Other Ambulatory Visit: Payer: Self-pay | Admitting: Family Medicine

## 2023-02-07 DIAGNOSIS — R0609 Other forms of dyspnea: Secondary | ICD-10-CM

## 2023-02-07 LAB — TESTOSTERONE FREE, PROFILE I
Albumin: 4.7 g/dL (ref 4.1–5.1)
Sex Hormone Binding: 17.6 nmol/L (ref 16.5–55.9)
Testost., Free, Calc: 91.6 pg/mL (ref 30.3–183.2)
Testosterone: 356 ng/dL (ref 264–916)

## 2023-02-07 LAB — LUTEINIZING HORMONE: LH: 3.1 m[IU]/mL (ref 1.7–8.6)

## 2023-02-07 LAB — FOLLICLE STIMULATING HORMONE: FSH: 2.9 m[IU]/mL (ref 1.5–12.4)

## 2023-02-07 LAB — PSA, TOTAL AND FREE
PSA, Free Pct: 20 %
PSA, Free: 0.14 ng/mL
Prostate Specific Ag, Serum: 0.7 ng/mL (ref 0.0–4.0)

## 2023-02-07 LAB — ESTRADIOL: Estradiol: 27.1 pg/mL (ref 7.6–42.6)

## 2023-02-07 LAB — PROLACTIN: Prolactin: 9.8 ng/mL (ref 3.9–22.7)

## 2023-02-08 ENCOUNTER — Other Ambulatory Visit (INDEPENDENT_AMBULATORY_CARE_PROVIDER_SITE_OTHER): Payer: Self-pay | Admitting: Gastroenterology

## 2023-02-08 DIAGNOSIS — K219 Gastro-esophageal reflux disease without esophagitis: Secondary | ICD-10-CM

## 2023-02-10 ENCOUNTER — Other Ambulatory Visit: Payer: Self-pay

## 2023-02-10 ENCOUNTER — Telehealth: Payer: Self-pay

## 2023-02-10 DIAGNOSIS — E291 Testicular hypofunction: Secondary | ICD-10-CM

## 2023-02-10 NOTE — Telephone Encounter (Signed)
Patient aware of MD response to lab work.  Patient did want to have labs rechecked in a month.  Orders in, lab apt and f/u apt with MD scheduled.

## 2023-02-10 NOTE — Telephone Encounter (Signed)
-----   Message from Irine Seal, MD sent at 02/10/2023 12:31 PM EDT ----- His repeat T is 356 which is in the normal range and his free T is 91 which is well within the normal range.    With these numbers I don't see a reason to treat for low T.  The cause of the fatigue is not clear.  His prior T was quite low at 157, so if he would like to get another T panel in a month with a morning fasting blood draw we could do that just to make sure which would be reasonable.   His PSA remains low at 0.7.    ----- Message ----- From: Audie Box, CMA Sent: 02/10/2023   8:32 AM EDT To: Irine Seal, MD  Please review.

## 2023-02-12 ENCOUNTER — Ambulatory Visit: Payer: 59 | Admitting: Dermatology

## 2023-02-12 ENCOUNTER — Encounter: Payer: Self-pay | Admitting: Dermatology

## 2023-02-12 VITALS — BP 147/93 | HR 60

## 2023-02-12 DIAGNOSIS — Z79899 Other long term (current) drug therapy: Secondary | ICD-10-CM | POA: Diagnosis not present

## 2023-02-12 DIAGNOSIS — L209 Atopic dermatitis, unspecified: Secondary | ICD-10-CM | POA: Diagnosis not present

## 2023-02-12 DIAGNOSIS — L281 Prurigo nodularis: Secondary | ICD-10-CM | POA: Diagnosis not present

## 2023-02-12 MED ORDER — DUPILUMAB 300 MG/2ML ~~LOC~~ SOPN
300.0000 mg | PEN_INJECTOR | Freq: Once | SUBCUTANEOUS | Status: AC
Start: 2023-02-12 — End: 2023-02-12
  Administered 2023-02-12: 300 mg via SUBCUTANEOUS

## 2023-02-12 NOTE — Patient Instructions (Addendum)
Call [place] [facility] [phone] regarding patient assistance.    Continue Dupixent 300 mg every 2 weeks.   Dupilumab (Dupixent) is a treatment given by injection for adults and children with moderate-to-severe atopic dermatitis. Goal is control of skin condition, not cure. It is given as 2 injections at the first dose followed by 1 injection ever 2 weeks thereafter.  Young children are dosed monthly.  Potential side effects include allergic reaction, herpes infections, injection site reactions and conjunctivitis (inflammation of the eyes).  The use of Dupixent requires long term medication management, including periodic office visits.   Continue clobetasol 0.05% cream to itchy spots at legs 1-2 times daily for up to 2 weeks as needed for itchy flares. Avoid applying to face, groin, and axilla. Use as directed. Long-term use can cause thinning of the skin. Continue Clob/CeraVe cream mix to aas body qd/bid prn itch Continue tacrolimus ointment 1-2 times daily. Continue hydroxyzine 25 mg at bedtime as needed for itch. May cause drowsiness.  Continue betamethasone dipropionate 0.05% lotion 1-2 times daily to affected areas at scalp as needed for itch. May use for ears qd prn itch for up to a week Continue ketoconazole 2% shampoo apply three times per week, massage into scalp and leave in for 5-10 minutes before rinsing out. Continue fluocinolone oil 1-2 drops daily to ears as needed for itch    Gentle Skin Care Guide  1. Bathe no more than once a day.  2. Avoid bathing in hot water  3. Use a mild soap like Dove, Vanicream, Cetaphil, CeraVe. Can use Lever 2000 or Cetaphil antibacterial soap  4. Use soap only where you need it. On most days, use it under your arms, between your legs, and on your feet. Let the water rinse other areas unless visibly dirty.  5. When you get out of the bath/shower, use a towel to gently blot your skin dry, don't rub it.  6. While your skin is still a little  damp, apply a moisturizing cream such as Vanicream, CeraVe, Cetaphil, Eucerin, Sarna lotion or plain Vaseline Jelly. For hands apply Neutrogena [place] Hand Cream or Excipial Hand Cream.  7. Reapply moisturizer any time you start to itch or feel dry.  8. Sometimes using free and clear laundry detergents can be helpful. Fabric softener sheets should be avoided. Downy Free & Gentle liquid, or any liquid fabric softener that is free of dyes and perfumes, it acceptable to use  9. If your doctor has given you prescription creams you may apply moisturizers over them      Due to recent changes in healthcare laws, you may see results of your pathology and/or laboratory studies on MyChart before the doctors have had a chance to review them. We understand that in some cases there may be results that are confusing or concerning to you. Please understand that not all results are received at the same time and often the doctors may need to interpret multiple results in order to provide you with the best plan of care or course of treatment. Therefore, we ask that you please give [place] 2 business days to thoroughly review all your results before contacting the office for clarification. Should we see a critical lab result, you will be contacted sooner.   If You Need Anything After Your Visit  If you have any questions or concerns for your doctor, please call our main line at [phone] and press option 4 to reach your doctor's medical assistant. If no one answers,  please leave a voicemail as directed and we will return your call as soon as possible. Messages left after 4 pm will be answered the following business day.   You may also send [place] a message via [place]. We typically respond to MyChart messages within 1-2 business days.  For prescription refills, please ask your pharmacy to contact our office. Our fax number is [phone].  If you have an urgent issue when the clinic is closed that cannot wait until  the next business day, you can page your doctor at the number below.    Please note that while we do our best to be available for urgent issues outside of office hours, we are not available 24/7.   If you have an urgent issue and are unable to reach [place], you may choose to seek medical care at your doctor's office, retail clinic, urgent care center, or emergency room.  If you have a medical emergency, please immediately call 911 or go to the emergency department.  Pager Numbers  - Dr. Nehemiah Massed: [phone]  - Dr. Laurence Ferrari: [phone]  - Dr. Nicole Kindred: [phone]  In the event of inclement weather, please call our main line at [phone] for an update on the status of any delays or closures.  Dermatology Medication Tips: Please keep the boxes that topical medications come in in order to help keep track of the instructions about where and how to use these. Pharmacies typically print the medication instructions only on the boxes and not directly on the medication tubes.   If your medication is too expensive, please contact our office at [phone] option 4 or send [place] a message through [place].   We are unable to tell what your co-pay for medications will be in advance as this is different depending on your insurance coverage. However, we may be able to find a substitute medication at lower cost or fill out paperwork to get insurance to cover a needed medication.   If a prior authorization is required to get your medication covered by your insurance company, please allow [place] 1-2 business days to complete this process.  Drug prices often vary depending on where the prescription is filled and some pharmacies may offer cheaper prices.  The website www.goodrx.com contains coupons for medications through different pharmacies. The prices here do not account for what the cost may be with help from insurance (it may be cheaper with your insurance), but the website can give you the price if you did  not use any insurance.  - You can print the associated coupon and take it with your prescription to the pharmacy.  - You may also stop by our office during regular business hours and pick up a GoodRx coupon card.  - If you need your prescription sent electronically to a different pharmacy, notify our office through [facility] or by phone at [phone] option 4.     Si Usted Necesita Algo Despus de Su Visita  Tambin puede enviarnos un mensaje a travs de Pharmacist, community. Por lo general respondemos a los mensajes de MyChart en el transcurso de 1 a 2 das hbiles.  Para renovar recetas, por favor pida a su farmacia que se ponga en contacto con nuestra oficina. Harland Dingwall de fax es [place] [phone].  Si tiene un asunto urgente cuando la clnica est cerrada y que no puede esperar hasta el siguiente da hbil, puede llamar/localizar a su doctor(a) al nmero que aparece a continuacin.   Por favor, tenga en cuenta que aunque  hacemos todo lo posible para estar disponibles para asuntos urgentes fuera del horario de oficina, no estamos disponibles las 24 horas del da, los 7 das de la [place].   Si tiene un problema urgente y no puede comunicarse con nosotros, puede optar por buscar atencin mdica  en el consultorio de su doctor(a), en una clnica privada, en un centro de atencin urgente o en una sala de emergencias.  Si tiene Engineering geologist, por favor llame inmediatamente al 911 o vaya a la sala de emergencias.  Nmeros de bper  - Dr. Nehemiah Massed: [phone]  - Dra. Moye: [phone]  - Dra. Nicole Kindred: [phone]  En caso de inclemencias del [place], por favor llame a Johnsie Kindred principal al [phone] para una actualizacin sobre el [place] de cualquier retraso o cierre.  Consejos para la medicacin en dermatologa: Por favor, guarde las cajas en las que vienen los medicamentos de uso tpico para ayudarle a seguir las instrucciones sobre dnde y cmo usarlos. Las  farmacias generalmente imprimen las instrucciones del medicamento slo en las cajas y no directamente en los tubos del [place].   Si su medicamento es muy caro, por favor, pngase en contacto con Zigmund Daniel llamando al [phone] y presione la opcin 4 o envenos un mensaje a travs de Pharmacist, community.   No podemos decirle cul ser su copago por los medicamentos por adelantado ya que esto es diferente dependiendo de la cobertura de su seguro. Sin embargo, es posible que podamos encontrar un medicamento sustituto a Electrical engineer un formulario para que el seguro cubra el medicamento que se considera necesario.   Si se requiere una autorizacin previa para que su compaa de seguros [place] su medicamento, por favor permtanos de 1 a 2 das hbiles para completar este proceso.  Los precios de los medicamentos varan con frecuencia dependiendo del Environmental consultant de dnde se surte la receta y alguna farmacias pueden ofrecer precios ms baratos.  El sitio web www.goodrx.com tiene cupones para medicamentos de Airline pilot. Los precios aqu no tienen en cuenta lo que podra costar con la ayuda del seguro (puede ser ms barato con su seguro), pero el sitio web puede darle el precio si no utiliz Research scientist (physical sciences).  - Puede imprimir el cupn correspondiente y llevarlo con su receta a la farmacia.  - Tambin puede pasar por nuestra oficina durante el horario de atencin regular y Charity fundraiser una tarjeta de cupones de GoodRx.  - Si necesita que su receta se enve electrnicamente a una farmacia diferente, informe a nuestra oficina a travs de MyChart de [place] o por telfono llamando al [phone] y presione la opcin 4.

## 2023-02-12 NOTE — Progress Notes (Signed)
Follow Up Visit   Subjective  Elijah Shaffer is a 42 y.o. male who presents for the following: Atopic dermatitis. Torso, hands, face, scalp. Started Dupixent 12/16/2022. Usind Clobetasol cream, Tacrolimus ointment to body areas as directed. Using Betamethasone lotion and ketoconazole shampoo to scalp as directed. Patient reports itching and rash have improved.  Denies adverse reactions or side effects.    The following portions of the chart were reviewed this encounter and updated as appropriate: medications, allergies, medical history  Review of Systems:  No other skin or systemic complaints except as noted in HPI or Assessment and Plan.  Objective  Well appearing patient in no apparent distress; mood and affect are within normal limits.  A focused examination was performed of the following areas: Torso, arms, hands, face, scalp  Relevant exam findings are noted in the Assessment and Plan.    Assessment & Plan   Atopic dermatitis, unspecified type  Related Medications clobetasol cream (TEMOVATE) 0.05 % Apply small amount daily before bed to right ankle or other areas of body as needed for itchy rash Avoid applying to face, groin, and axilla Use as directed.  tacrolimus (PROTOPIC) 0.1 % ointment APPLY TOPICALLY TO AFFECTED AREAS OF BODY FOR RASH DAILY OR TWICE DAILY AS NEEDED  dupilumab (DUPIXENT) 300 MG/2ML prefilled syringe Inject 300 mg into the skin every 14 (fourteen) days. Starting at day 15 for maintenance.  Dupilumab SOPN 300 mg     Atopic dermatitis with prurigo nodularis Exam: Firm scaly indistinct papule at posterior scalp. Hyperpigmented patch at left lateral ankle. Rest of skin is clear, and pt denies itching.  Chronic and persistent condition with duration or expected duration over one year. Condition is symptomatic / bothersome to patient. Not to goal. Patient's itching has improved significantly on the Dupixent samples, and not needing to use topicals.  Still working on getting medication covered by insurance.   Atopic dermatitis - Severe, on Dupixent (biologic medication).  Atopic dermatitis (eczema) is a chronic, relapsing, pruritic condition that can significantly affect quality of life. It is often associated with allergic rhinitis and/or asthma and can require treatment with topical medications, phototherapy, or in severe cases biologic medications, which require long term medication management.    Treatment Plan: Continue Dupixent 300 mg every 2 weeks.   Dupilumab (Dupixent) is a treatment given by injection for adults and children with moderate-to-severe atopic dermatitis. Goal is control of skin condition, not cure. It is given as 2 injections at the first dose followed by 1 injection ever 2 weeks thereafter.  Young children are dosed monthly.  Potential side effects include allergic reaction, herpes infections, injection site reactions and conjunctivitis (inflammation of the eyes).  The use of Dupixent requires long term medication management, including periodic office visits.   Continue clobetasol 0.05% cream to itchy spots at legs 1-2 times daily for up to 2 weeks as needed for itchy flares. Avoid applying to face, groin, and axilla. Use as directed. Long-term use can cause thinning of the skin. Continue Clob/CeraVe cream mix to aas body qd/bid prn itch Continue tacrolimus ointment 1-2 times daily prn. Continue hydroxyzine 25 mg at bedtime as needed for itch. May cause drowsiness.  Continue betamethasone dipropionate 0.05% lotion 1-2 times daily to affected areas at scalp as needed for itch. May use for ears qd prn itch for up to a week Continue ketoconazole 2% shampoo apply three times per week, massage into scalp and leave in for 5-10 minutes before rinsing out. Continue fluocinolone  oil 1-2 drops daily to ears as needed for itch.   Avoid picking   Topical steroids (such as triamcinolone, fluocinolone, fluocinonide, mometasone,  clobetasol, halobetasol, betamethasone, hydrocortisone) can cause thinning and lightening of the skin if they are used for too long in the same area. Your physician has selected the right strength medicine for your problem and area affected on the body. Please use your medication only as directed by your physician to prevent side effects.      Dupixent 300 mg injected into left upper arm. Patient tolerated well.   Rice: [id number] Lot: [id number] Exp: 10/10/2024    Return for Dupixent injection nurse visit in 2 weeks, Atopic Dermatitis follow up 3 months.  I, Emelia Salisbury, CMA, am acting as scribe for [name], MD.   Documentation: I have reviewed the above documentation for accuracy and completeness, and I agree with the above.  [name], MD

## 2023-02-26 ENCOUNTER — Ambulatory Visit (INDEPENDENT_AMBULATORY_CARE_PROVIDER_SITE_OTHER): Payer: 59

## 2023-02-26 DIAGNOSIS — L209 Atopic dermatitis, unspecified: Secondary | ICD-10-CM

## 2023-02-26 MED ORDER — DUPILUMAB 300 MG/2ML ~~LOC~~ SOSY
300.0000 mg | PREFILLED_SYRINGE | Freq: Once | SUBCUTANEOUS | Status: AC
Start: 2023-02-26 — End: 2023-02-26
  Administered 2023-02-26: 300 mg via SUBCUTANEOUS

## 2023-02-26 NOTE — Progress Notes (Signed)
Patient here today for 2 week Dupixent injection for Severe Atopic Dermatitis.   Dupixent  injected into patients right upper arm. Patient tolerated procedure well.    [id number] Exp: 04/2025 WUJ:[id number]   Dorathy Daft RMA

## 2023-03-09 ENCOUNTER — Other Ambulatory Visit: Payer: Self-pay | Admitting: Family Medicine

## 2023-03-09 DIAGNOSIS — R0609 Other forms of dyspnea: Secondary | ICD-10-CM

## 2023-03-12 ENCOUNTER — Ambulatory Visit (INDEPENDENT_AMBULATORY_CARE_PROVIDER_SITE_OTHER): Payer: 59

## 2023-03-12 DIAGNOSIS — L209 Atopic dermatitis, unspecified: Secondary | ICD-10-CM

## 2023-03-12 MED ORDER — DUPILUMAB 300 MG/2ML ~~LOC~~ SOSY
300.0000 mg | PREFILLED_SYRINGE | Freq: Once | SUBCUTANEOUS | Status: AC
Start: 2023-03-12 — End: 2023-03-12
  Administered 2023-03-12: 300 mg via SUBCUTANEOUS

## 2023-03-12 NOTE — Progress Notes (Signed)
Patient here today for 2 week Dupixent injection for Severe Atopic Dermatitis.   Dupixent 300mg  injected into patients left upper arm. Patient tolerated procedure well.    Lot: [id number]  Exp: 02/2025 NDC: 0454-0981-19   Dorathy Daft RMA

## 2023-03-19 ENCOUNTER — Other Ambulatory Visit: Payer: Self-pay | Admitting: Family Medicine

## 2023-03-24 ENCOUNTER — Ambulatory Visit (INDEPENDENT_AMBULATORY_CARE_PROVIDER_SITE_OTHER): Payer: 59 | Admitting: Internal Medicine

## 2023-03-24 ENCOUNTER — Other Ambulatory Visit: Payer: 59

## 2023-03-24 ENCOUNTER — Encounter: Payer: Self-pay | Admitting: Internal Medicine

## 2023-03-24 VITALS — BP 144/86 | HR 72 | Ht 75.0 in | Wt 264.6 lb

## 2023-03-24 DIAGNOSIS — I471 Supraventricular tachycardia, unspecified: Secondary | ICD-10-CM

## 2023-03-24 DIAGNOSIS — R7303 Prediabetes: Secondary | ICD-10-CM

## 2023-03-24 DIAGNOSIS — I10 Essential (primary) hypertension: Secondary | ICD-10-CM | POA: Diagnosis not present

## 2023-03-24 DIAGNOSIS — K219 Gastro-esophageal reflux disease without esophagitis: Secondary | ICD-10-CM

## 2023-03-24 DIAGNOSIS — F444 Conversion disorder with motor symptom or deficit: Secondary | ICD-10-CM | POA: Diagnosis not present

## 2023-03-24 DIAGNOSIS — K581 Irritable bowel syndrome with constipation: Secondary | ICD-10-CM

## 2023-03-24 DIAGNOSIS — E782 Mixed hyperlipidemia: Secondary | ICD-10-CM | POA: Diagnosis not present

## 2023-03-24 DIAGNOSIS — F419 Anxiety disorder, unspecified: Secondary | ICD-10-CM

## 2023-03-24 DIAGNOSIS — L309 Dermatitis, unspecified: Secondary | ICD-10-CM

## 2023-03-24 DIAGNOSIS — G4733 Obstructive sleep apnea (adult) (pediatric): Secondary | ICD-10-CM

## 2023-03-24 DIAGNOSIS — E291 Testicular hypofunction: Secondary | ICD-10-CM | POA: Diagnosis not present

## 2023-03-24 MED ORDER — AMLODIPINE BESYLATE 5 MG PO TABS
5.0000 mg | ORAL_TABLET | Freq: Every day | ORAL | 1 refills | Status: DC
Start: 2023-03-24 — End: 2023-09-08

## 2023-03-24 NOTE — Patient Instructions (Signed)
Please start taking Amlodipine as prescribed for blood pressure.  Please continue to take other medications as prescribed.  Please continue to follow low carb diet and perform moderate exercise/walking at least 150 mins/week.

## 2023-03-24 NOTE — Assessment & Plan Note (Signed)
Uses CPAP regularly °

## 2023-03-24 NOTE — Assessment & Plan Note (Signed)
BP Readings from Last 1 Encounters:  03/24/23 (!) 144/86   New onset On propranolol for PSVT and tremors Added amlodipine 5 mg QD Counseled for compliance with the medications Advised DASH diet and moderate exercise/walking, at least 150 mins/week

## 2023-03-24 NOTE — Assessment & Plan Note (Signed)
Usually has chronic constipation Takes Amitiza, advised to hold if he has diarrhea

## 2023-03-24 NOTE — Progress Notes (Signed)
Established Patient Office Visit  Subjective:  Patient ID: Elijah Shaffer, male    DOB: Jul 11, 1981  Age: 42 y.o. MRN: [id number]  CC:  Chief Complaint  Patient presents with   Anxiety    Follow up    HPI Elijah Shaffer is a 42 y.o. male with past medical history of GERD, eczema, anxiety and chronic dyspnea who presents for f/u of his chronic medical conditions.  His blood pressure was elevated today.  He has chronic palpitations and dyspnea on exertion.  Denies any chest pain currently.  IBS: Denies any abdominal pain currently.  He usually has chronic constipation and takes Amitiza for it.  Denies any melena or hematochezia.  Anxiety: He takes Prozac and Cymbalta currently.  He still has episodes of anxiety and tremors.  He was placed on clonazepam by Dr. Gerilyn Pilgrim, but was discontinued by [facility].  He has multiple physical complaints such as constant itching, dyspnea, tremors, etc., likely somewhat attributed to anxiety.  He had urology evaluation for low testosterone.  His testosterone level was WNL in 03/24, but needs repeat check per Urology.  Denies any dysuria, hematuria or urethral discharge currently.  He has chronic fatigue, but denies any fever, chills, hemoptysis, recent weight loss or LAD.    Past Medical History:  Diagnosis Date   Acute renal injury (HCC) 06/28/2017   Anxiety    Aspiration pneumonia of right upper lobe due to gastric secretions (HCC)    Diverticulitis    GERD (gastroesophageal reflux disease) 01/08/2018   Seasonal allergies    Syncope 06/26/2021    Past Surgical History:  Procedure Laterality Date   BIOPSY  03/18/2021   Procedure: BIOPSY;  Surgeon: Dolores Frame, MD;  [facility];  Service: Gastroenterology;;  esophageal at Z-line   BIOPSY  05/23/2021   Procedure: BIOPSY;  Surgeon: Dolores Frame, MD;  [facility]: [facility];  Service: Gastroenterology;;   CARDIOPULMONARY EXERCISE TEST (CPX)   01/09/2022   Interpretation limited by submaximal effort.  Severe functional impairment when compared to max sedentary norms.  No cardiopulmonary limitations.  Noted tremor and generalized weakness that lead to premature exercise termination-consider neuromuscular evaluation.   COLONOSCOPY N/A 09/22/2017   Procedure: COLONOSCOPY;  Surgeon: Malissa Hippo, MD;  Location: [facility];  Service: Endoscopy;  Laterality: N/A;  9:55   COLONOSCOPY WITH PROPOFOL N/A 05/23/2021   Procedure: COLONOSCOPY WITH PROPOFOL;  Surgeon: Dolores Frame, MD;  [facility]: [facility];  Service: Gastroenterology;  Laterality: N/A;  7:30   ESOPHAGOGASTRODUODENOSCOPY (EGD) WITH PROPOFOL N/A 03/18/2021   Procedure: ESOPHAGOGASTRODUODENOSCOPY (EGD) WITH PROPOFOL;  Surgeon: Dolores Frame, MD;  [facility]: [facility];  Service: Gastroenterology;  Laterality: N/A;   ESOPHAGOGASTRODUODENOSCOPY (EGD) WITH PROPOFOL N/A 05/23/2021   Procedure: ESOPHAGOGASTRODUODENOSCOPY (EGD) WITH PROPOFOL;  Surgeon: Dolores Frame, MD;  [facility]: [facility];  Service: Gastroenterology;  Laterality: N/A;   POLYPECTOMY  09/22/2017   Procedure: POLYPECTOMY;  Surgeon: Malissa Hippo, MD;  [facility]: [facility];  Service: Endoscopy;;   RIGHT/LEFT HEART CATH AND CORONARY ANGIOGRAPHY N/A 12/12/2021   Procedure: RIGHT/LEFT HEART CATH AND CORONARY ANGIOGRAPHY;  Surgeon: Dolores Patty, MD;  Location: [facility];  Service: Cardiovascular:: Normal Coronaries & Hemodynamics.  EF 50-55%. RA = 4 mmHg, RV = 23/4; PA = 22/6 (14) & PCW = 7; Ao sat = 95%; PA sat = 76%, 76%& SVC sat = 74%Fick cardiac output/index = 7.1/3.0; PVR = 1.0 WU  TRANSTHORACIC ECHOCARDIOGRAM  02/24/2021   EF 60 to 65%.  Normal LV function.  Normal valves.  Mild RV dilation with normal function.   ZIO PATCH EVENT MONITOR  01/2022   Sinus rhythm with heart rate range 24 to 159 bpm, 1  AVB and Wenckebach block noted along with rare  PACs and PVCs.  No arrhythmias.    Family History  Problem Relation Age of Onset   Healthy Mother    Cervical cancer Mother    Cancer Father    Colon cancer Maternal Grandmother    Colon cancer Maternal Grandfather     Social History   Socioeconomic History   Marital status: Single    Spouse name: Not on file   Number of children: Not on file   Years of education: Not on file   Highest education level: Not on file  Occupational History   Not on file  Tobacco Use   Smoking status: Never    Passive exposure: Past   Smokeless tobacco: Never  Vaping Use   Vaping Use: Never used  Substance and Sexual Activity   Alcohol use: Yes    Comment: occ   Drug use: No   Sexual activity: Not on file  Other Topics Concern   Not on file  Social History Narrative   He currently has his own lawn care landscaping service. He previous had 80 yards before he got sick and since decreased. He works around a Interior and spatial designer. One of the chemical names is roundup.    Social Determinants of Health   Financial Resource Strain: Not on file  Food Insecurity: No Food Insecurity (12/27/2022)   Hunger Vital Sign    Worried About Running Out of Food in the Last Year: Never true    Ran Out of Food in the Last Year: Never true  Transportation Needs: No Transportation Needs (12/27/2022)   PRAPARE - Administrator, Civil Service (Medical): No    Lack of Transportation (Non-Medical): No  Physical Activity: Insufficiently Active (03/29/2022)   Exercise Vital Sign    Days of Exercise per Week: 2 days    Minutes of Exercise per Session: 10 min  Stress: Stress Concern Present (05/07/2022)   [employer] of Occupational Health - Occupational Stress Questionnaire    Feeling of Stress : To some extent  Social Connections: Not on file  Intimate Partner Violence: Not At Risk (12/27/2022)   Humiliation, Afraid, Rape, and Kick questionnaire    Fear of Current or Ex-Partner: No    Emotionally  Abused: No    Physically Abused: No    Sexually Abused: No    Outpatient Medications Prior to Visit  Medication Sig Dispense Refill   albuterol (VENTOLIN HFA) 108 (90 Base) MCG/ACT inhaler INHALE 2 PUFFS BY MOUTH EVERY 6 HOURS AS NEEDED FOR SHORTNESS OF BREATH 9 g 0   aspirin EC 81 MG tablet Take 81 mg by mouth daily. Swallow whole.     b complex vitamins capsule Take 1 capsule by mouth daily. With D3 gummie     betamethasone dipropionate 0.05 % lotion Apply 1-2 times daily to affected itchy areas at scalp. Avoid applying to face, groin, and axilla. Use as directed. Long-term use can cause thinning of the skin. 60 mL 2   cetirizine (ZYRTEC) 10 MG tablet Take 10 mg by mouth daily.     Cholecalciferol (VITAMIN D-3 PO) Take by mouth.     clindamycin (CLEOCIN T) 1 % external solution Apply  topically to acne bumps at aa's qd 30 mL 3   clobetasol (TEMOVATE) 0.05 % external solution Apply 1 application. topically 2 (two) times daily. Use as directed.  Mix with cerave cream. Use for up to 4 weeks. Avoid applying to face, groin, and axilla. Use as directed. 50 mL 1   clobetasol cream (TEMOVATE) 0.05 % Apply small amount daily before bed to right ankle or other areas of body as needed for itchy rash Avoid applying to face, groin, and axilla Use as directed. 30 g 0   cyclobenzaprine (FLEXERIL) 5 MG tablet Take 1 tablet (5 mg total) by mouth at bedtime as needed for muscle spasms. 30 tablet 1   diphenhydrAMINE-zinc acetate (BENADRYL EXTRA STRENGTH) cream Apply 1 application topically 3 (three) times daily as needed for itching. 28.4 g 0   docusate sodium (COLACE) 100 MG capsule Take 100 mg by mouth 2 (two) times daily.     DULoxetine (CYMBALTA) 30 MG capsule Take 60 mg by mouth daily.     dupilumab (DUPIXENT) 300 MG/2ML prefilled syringe Inject 300 mg into the skin every 14 (fourteen) days. Starting at day 15 for maintenance. 4 mL 5   Dupilumab (DUPIXENT) 300 MG/2ML SOPN Inject 300 mg into the skin every  14 (fourteen) days. Starting at day 15 for maintenance. 4 mL 5   fenofibrate (TRICOR) 145 MG tablet Take 1 tablet (145 mg total) by mouth daily. 90 tablet 3   Fluocinolone Acetonide 0.01 % OIL Use 1 - 2 drop qd/bid prn for scale at ears 20 mL 2   fluocinonide (LIDEX) 0.05 % external solution Apply once or twice daily to affected areas on scalp up to 2 weeks as needed for itching. Avoid applying to face, groin, and axilla. 60 mL 2   FLUoxetine (PROZAC) 20 MG capsule Take 1 capsule (20 mg total) by mouth daily. 90 capsule 1   fluticasone (FLONASE ALLERGY RELIEF) 50 MCG/ACT nasal spray Place 2 sprays into both nostrils daily as needed for allergies or rhinitis. (Patient taking differently: Place 2 sprays into both nostrils daily.) 9.9 mL 2   fluticasone-salmeterol (ADVAIR) 250-50 MCG/ACT AEPB as needed.     gabapentin (NEURONTIN) 300 MG capsule Take 300 mg by mouth 3 (three) times daily.     guaiFENesin (MUCINEX) 600 MG 12 hr tablet Take by mouth.     hydrOXYzine (VISTARIL) 25 MG capsule Take 1 capsule (25 mg total) by mouth every 8 (eight) hours as needed. Take before bed as needed for itchy. Can cause drowsiness. 60 capsule 2   ketoconazole (NIZORAL) 2 % shampoo Apply 1 Application topically daily as needed for irritation. 120 mL 11   lubiprostone (AMITIZA) 8 MCG capsule Take 1 capsule (8 mcg total) by mouth 2 (two) times daily with a meal. 180 capsule 3   montelukast (SINGULAIR) 10 MG tablet Take 1 tablet (10 mg total) by mouth at bedtime. 30 tablet 5   naproxen (NAPROSYN) 500 MG tablet Take 500 mg by mouth 2 (two) times daily.     omega-3 acid ethyl esters (LOVAZA) 1 g capsule Take 2 capsules (2 g total) by mouth 2 (two) times daily. 120 capsule 3   omeprazole (PRILOSEC) 40 MG capsule Take 1 capsule by mouth twice daily 180 capsule 1   ondansetron (ZOFRAN) 4 MG tablet Take 1 tablet (4 mg total) by mouth every 6 (six) hours as needed for nausea. 20 tablet 0   potassium gluconate (RA POTASSIUM  GLUCONATE) 595 (99 K) MG TABS tablet  Take 1 tablet by mouth daily.     propranolol (INDERAL) 20 MG tablet Take one tablet 20 mg  twice daily  may take an  additional dose of 20 mg  as needed for palpitations 190 tablet 3   rosuvastatin (CRESTOR) 40 MG tablet Take 1 tablet (40 mg total) by mouth daily. 90 tablet 3   senna (SENOKOT) 8.6 MG tablet Take 1 tablet by mouth daily. prn     tacrolimus (PROTOPIC) 0.1 % ointment APPLY TOPICALLY TO AFFECTED AREAS OF BODY FOR RASH DAILY OR TWICE DAILY AS NEEDED 30 g 0   triamcinolone cream (KENALOG) 0.1 %      No facility-administered medications prior to visit.    Allergies  Allergen Reactions   Gramineae Pollens Itching   Claritin [Loratadine] Other (See Comments)    Heart race   Loratadine-Pseudoephedrine Er Palpitations   Pollen Extract Other (See Comments)    Seasonal allergies    ROS Review of Systems  Constitutional:  Positive for fatigue. Negative for chills and fever.  HENT:  Negative for congestion and sore throat.   Eyes:  Negative for pain and discharge.  Respiratory:  Positive for shortness of breath. Negative for cough.   Cardiovascular:  Positive for palpitations. Negative for chest pain.  Gastrointestinal:  Positive for constipation. Negative for diarrhea, nausea and vomiting.  Endocrine: Negative for polydipsia and polyuria.  Genitourinary:  Negative for dysuria and hematuria.  Musculoskeletal:  Positive for arthralgias, back pain, myalgias and neck pain. Negative for neck stiffness.  Neurological:  Positive for dizziness and tremors. Negative for headaches.  Psychiatric/Behavioral:  Positive for sleep disturbance. Negative for agitation and behavioral problems. The patient is nervous/anxious.       Objective:    Physical Exam Vitals reviewed.  Constitutional:      General: He is not in acute distress.    Appearance: He is not diaphoretic.  HENT:     Head: Normocephalic and atraumatic.     Nose: Nose normal.      Mouth/Throat:     Mouth: Mucous membranes are moist.  Eyes:     General: No scleral icterus.    Extraocular Movements: Extraocular movements intact.  Cardiovascular:     Rate and Rhythm: Normal rate and regular rhythm.     Pulses: Normal pulses.     Heart sounds: Normal heart sounds. No murmur heard. Pulmonary:     Breath sounds: Normal breath sounds. No wheezing or rales.  Abdominal:     General: Bowel sounds are normal.     Palpations: Abdomen is soft.     Tenderness: There is no abdominal tenderness.  Musculoskeletal:        General: Tenderness (Mid thoracic and lumbar spine area) present.     Cervical back: Neck supple. No tenderness.     Right lower leg: No edema.     Left lower leg: No edema.  Skin:    General: Skin is warm.     Coloration: Skin is not jaundiced.  Neurological:     General: No focal deficit present.     Mental Status: He is alert and oriented to person, place, and time.     Cranial Nerves: No cranial nerve deficit.     Sensory: No sensory deficit.     Motor: No weakness.     Comments: Functional tremors on right hand  Psychiatric:        Mood and Affect: Mood is anxious and depressed. Affect is flat.  Thought Content: Thought content does not include homicidal or suicidal ideation.     BP (!) 144/86 (BP Location: Left Arm)   Pulse 72   Ht 6\' 3"  (1.905 m)   Wt 264 lb 9.6 oz (120 kg)   SpO2 98%   BMI 33.07 kg/m  Wt Readings from Last 3 Encounters:  03/24/23 264 lb 9.6 oz (120 kg)  02/06/23 242 lb (109.8 kg)  01/23/23 242 lb (109.8 kg)    Lab Results  Component Value Date   TSH 3.110 12/02/2022   Lab Results  Component Value Date   WBC 11.8 (H) 01/07/2023   HGB 16.1 01/07/2023   HCT 47.6 01/07/2023   MCV 89 01/07/2023   PLT 291 01/07/2023   Lab Results  Component Value Date   NA 137 01/07/2023   K 4.3 01/07/2023   CO2 18 (L) 01/07/2023   GLUCOSE 88 01/07/2023   BUN 16 01/07/2023   CREATININE 1.40 (H) 01/07/2023   BILITOT  0.6 12/02/2022   ALKPHOS 76 12/02/2022   AST 34 12/02/2022   ALT 49 (H) 12/02/2022   PROT 7.4 12/02/2022   ALBUMIN 4.7 02/06/2023   CALCIUM 9.9 01/07/2023   ANIONGAP 10 12/27/2021   EGFR 65 01/07/2023   GFR 68.05 10/02/2021   Lab Results  Component Value Date   CHOL 266 (H) 12/02/2022   Lab Results  Component Value Date   HDL 31 (L) 12/02/2022   Lab Results  Component Value Date   LDLCALC Comment (A) 12/02/2022   Lab Results  Component Value Date   TRIG 987 (HH) 12/02/2022   Lab Results  Component Value Date   CHOLHDL 8.6 (H) 12/02/2022   Lab Results  Component Value Date   HGBA1C 6.0 (H) 12/02/2022      Assessment & Plan:   Problem List Items Addressed This Visit       Cardiovascular and Mediastinum   PSVT (paroxysmal supraventricular tachycardia) (Chronic)    On propanolol Has had cardiac monitor evaluation with Cardiology      Relevant Medications   amLODipine (NORVASC) 5 MG tablet     Respiratory   OSA (obstructive sleep apnea)    Uses CPAP regularly        Digestive   GERD (gastroesophageal reflux disease) (Chronic)    On Pantoprazole 40 mg QD      Irritable bowel syndrome with constipation    Usually has chronic constipation Takes Amitiza, advised to hold if he has diarrhea        Endocrine   Hypogonadism, male    Testosterone level was WNL recently, but needs repeat check Check total and free testosterone, FSH and LH as per urology recommendation      Relevant Orders   Estradiol   FSH/LH   Testosterone,Free and Total     Musculoskeletal and Integument   Eczema    Has steroid creams, followed by dermatology Has Atarax as needed for itching, may need to switch agent as he is having urinary retention Recently was placed on Dupixent        Other   Anxiety (Chronic)    Overall well-controlled now On Prozac 20 mg QD now Hydroxyzine as needed for anxiety      Mixed hyperlipidemia (Chronic)    On Crestor and  Fenofibrate Check lipid profile      Relevant Medications   amLODipine (NORVASC) 5 MG tablet   Other Relevant Orders   Lipid Profile   Psychogenic tremor   Other Visit  Diagnoses     Essential hypertension    -  Primary   Relevant Medications   amLODipine (NORVASC) 5 MG tablet   Prediabetes       Relevant Orders   Hemoglobin A1c   CMP14+EGFR      Meds ordered this encounter  Medications   amLODipine (NORVASC) 5 MG tablet    Sig: Take 1 tablet (5 mg total) by mouth daily.    Dispense:  90 tablet    Refill:  1    Follow-up: Return in about 3 months (around 06/24/2023) for HTN and GAD.    Anabel Halon, MD

## 2023-03-24 NOTE — Assessment & Plan Note (Addendum)
Overall well-controlled now On Prozac 20 mg QD now Hydroxyzine as needed for anxiety

## 2023-03-24 NOTE — Assessment & Plan Note (Signed)
On Crestor and Fenofibrate Check lipid profile

## 2023-03-24 NOTE — Assessment & Plan Note (Signed)
Testosterone level was WNL recently, but needs repeat check Check total and free testosterone, FSH and LH as per urology recommendation

## 2023-03-24 NOTE — Assessment & Plan Note (Signed)
On propanolol Has had cardiac monitor evaluation with Cardiology 

## 2023-03-24 NOTE — Assessment & Plan Note (Signed)
Has steroid creams, followed by dermatology Has Atarax as needed for itching, may need to switch agent as he is having urinary retention Recently was placed on Dupixent

## 2023-03-24 NOTE — Assessment & Plan Note (Signed)
On Pantoprazole 40 mg QD 

## 2023-03-24 NOTE — Assessment & Plan Note (Addendum)
Lab Results  Component Value Date   HGBA1C 6.0 (H) 12/02/2022   Advised to follow DASH diet

## 2023-03-25 LAB — CMP14+EGFR
ALT: 73 IU/L — ABNORMAL HIGH (ref 0–44)
BUN: 15 mg/dL (ref 6–24)
Chloride: 103 mmol/L (ref 96–106)
Potassium: 4.8 mmol/L (ref 3.5–5.2)
Sodium: 142 mmol/L (ref 134–144)

## 2023-03-26 ENCOUNTER — Ambulatory Visit (INDEPENDENT_AMBULATORY_CARE_PROVIDER_SITE_OTHER): Payer: 59

## 2023-03-26 DIAGNOSIS — L209 Atopic dermatitis, unspecified: Secondary | ICD-10-CM

## 2023-03-26 MED ORDER — DUPILUMAB 300 MG/2ML ~~LOC~~ SOSY
300.0000 mg | PREFILLED_SYRINGE | Freq: Once | SUBCUTANEOUS | Status: AC
Start: 2023-03-26 — End: 2023-03-26
  Administered 2023-03-26: 300 mg via SUBCUTANEOUS

## 2023-03-26 NOTE — Progress Notes (Signed)
Patient here today for 2 week Dupixent injection for Severe Atopic Dermatitis.   Dupixent 300mg  injected into patients right upper arm. Patient tolerated procedure well.    Lot: [id number]    Exp: 02/2025 NDC: 0454-0981-19   Dorathy Daft RMA

## 2023-03-30 LAB — LIPID PANEL
Chol/HDL Ratio: 3.7 ratio (ref 0.0–5.0)
Cholesterol, Total: 133 mg/dL (ref 100–199)
HDL: 36 mg/dL — ABNORMAL LOW (ref >39)
LDL Chol Calc (NIH): 57 mg/dL (ref 0–99)
Triglycerides: 253 mg/dL — ABNORMAL HIGH (ref 0–149)
VLDL Cholesterol Cal: 40 mg/dL (ref 5–40)

## 2023-03-30 LAB — CMP14+EGFR
AST: 46 IU/L — ABNORMAL HIGH (ref 0–40)
Albumin/Globulin Ratio: 2 (ref 1.2–2.2)
Albumin: 4.9 g/dL (ref 4.1–5.1)
Alkaline Phosphatase: 74 IU/L (ref 44–121)
BUN/Creatinine Ratio: 10 (ref 9–20)
Bilirubin Total: 0.5 mg/dL (ref 0.0–1.2)
CO2: 23 mmol/L (ref 20–29)
Calcium: 10 mg/dL (ref 8.7–10.2)
Creatinine, Ser: 1.53 mg/dL — ABNORMAL HIGH (ref 0.76–1.27)
Globulin, Total: 2.5 g/dL (ref 1.5–4.5)
Glucose: 113 mg/dL — ABNORMAL HIGH (ref 70–99)
Total Protein: 7.4 g/dL (ref 6.0–8.5)
eGFR: 58 mL/min/{1.73_m2} — ABNORMAL LOW (ref >59)

## 2023-03-30 LAB — ESTRADIOL: Estradiol: 16.7 pg/mL (ref 7.6–42.6)

## 2023-03-30 LAB — TESTOSTERONE,FREE AND TOTAL
Testosterone, Free: 8.5 pg/mL (ref 6.8–21.5)
Testosterone: 254 ng/dL — ABNORMAL LOW (ref 264–916)

## 2023-03-30 LAB — HEMOGLOBIN A1C
Est. average glucose Bld gHb Est-mCnc: 128 mg/dL
Hgb A1c MFr Bld: 6.1 % — ABNORMAL HIGH (ref 4.8–5.6)

## 2023-03-30 LAB — FSH/LH
FSH: 2.7 m[IU]/mL (ref 1.5–12.4)
LH: 2.9 m[IU]/mL (ref 1.7–8.6)

## 2023-04-02 ENCOUNTER — Ambulatory Visit: Payer: 59 | Admitting: Internal Medicine

## 2023-04-02 ENCOUNTER — Encounter (INDEPENDENT_AMBULATORY_CARE_PROVIDER_SITE_OTHER): Payer: Self-pay | Admitting: Gastroenterology

## 2023-04-03 ENCOUNTER — Other Ambulatory Visit: Payer: Self-pay | Admitting: Internal Medicine

## 2023-04-03 DIAGNOSIS — R0609 Other forms of dyspnea: Secondary | ICD-10-CM

## 2023-04-09 ENCOUNTER — Ambulatory Visit (INDEPENDENT_AMBULATORY_CARE_PROVIDER_SITE_OTHER): Payer: 59

## 2023-04-09 DIAGNOSIS — L209 Atopic dermatitis, unspecified: Secondary | ICD-10-CM

## 2023-04-09 MED ORDER — DUPILUMAB 300 MG/2ML ~~LOC~~ SOSY
300.0000 mg | PREFILLED_SYRINGE | Freq: Once | SUBCUTANEOUS | Status: AC
Start: 2023-04-09 — End: 2023-04-09
  Administered 2023-04-09: 300 mg via SUBCUTANEOUS

## 2023-04-09 NOTE — Progress Notes (Signed)
Patient here today for 2 week Dupixent injection for Severe Atopic Dermatitis.   Dupixent 300mg  injected into patients left upper arm. Patient tolerated procedure well.    Lot: [id number] Exp: 04/2025 NDC: 1478-2956-21   Dorathy Daft RMA

## 2023-04-10 ENCOUNTER — Ambulatory Visit: Payer: 59 | Admitting: Urology

## 2023-04-10 ENCOUNTER — Telehealth (INDEPENDENT_AMBULATORY_CARE_PROVIDER_SITE_OTHER): Payer: Self-pay | Admitting: Gastroenterology

## 2023-04-10 VITALS — BP 119/80 | HR 96

## 2023-04-10 DIAGNOSIS — R5383 Other fatigue: Secondary | ICD-10-CM | POA: Diagnosis not present

## 2023-04-10 DIAGNOSIS — E291 Testicular hypofunction: Secondary | ICD-10-CM

## 2023-04-10 DIAGNOSIS — N183 Chronic kidney disease, stage 3 unspecified: Secondary | ICD-10-CM

## 2023-04-10 MED ORDER — TESTOSTERONE 50 MG/5GM (1%) TD GEL: 5.0000 g | Freq: Every day | TRANSDERMAL | 5 refills | Status: DC

## 2023-04-10 NOTE — Telephone Encounter (Signed)
Pt contacted and states pharmacy sent him a message in regards to Omeprazole. [facility] states that Omeprazole is to be sent through his insurance. Pt states insurance is not paying for Omeprazole.

## 2023-04-10 NOTE — Progress Notes (Signed)
Subjective: 1. Hypogonadism in male   2. Other fatigue      Consult requested by Dr. Trena Platt  04/10/23: Elijah Shaffer returns today in f/u for his history of hypogonadism.  His repeat T on 02/06/23 was 356 with a free T of 91.6 but a repeat prior to this visit is 254.  LH/FSH are on the lower side of normal and his prolactin and estradiol were normal.   He has some issues with fatigue but it is intermittent.    02/06/23: Elijah Shaffer is a 42 yo male who is sent for low testosterone of 157 on his labs from 12/24/22.  He was having fatigue and reduced energy.  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  He doesn't report issues with reduced libido or erections but he is not active.  His Hgb was 18.2 on 12/23/22 but it was back down to 16.1 on 01/07/23 and that was felt to be related to improper use of his CPAP that he is on for OSA.  He is on gabapentin for sciatica.   He has had no recent weight loss.  His TSH was normal on 12/02/22.  He has CKD3 and hyperlipidemia.  His IPSS is 1. With nocturia x 1.   ROS:  Review of Systems  Constitutional:  Positive for malaise/fatigue.  HENT:  Positive for congestion.   Neurological:  Positive for tingling.    Allergies  Allergen Reactions   Gramineae Pollens Itching   Claritin [Loratadine] Other (See Comments)    Heart race   Loratadine-Pseudoephedrine Er Palpitations   Pollen Extract Other (See Comments)    Seasonal allergies    Past Medical History:  Diagnosis Date   Acute renal injury (HCC) 06/28/2017   Anxiety    Aspiration pneumonia of right upper lobe due to gastric secretions (HCC)    Diverticulitis    GERD (gastroesophageal reflux disease) 01/08/2018   Seasonal allergies    Syncope 06/26/2021    Past Surgical History:  Procedure Laterality Date   BIOPSY  03/18/2021   Procedure: BIOPSY;  Surgeon: Dolores Frame, MD;  [facility];  Service: Gastroenterology;;   esophageal at Z-line   BIOPSY  05/23/2021   Procedure: BIOPSY;  Surgeon: Dolores Frame, MD;  [facility]: [facility];  Service: Gastroenterology;;   CARDIOPULMONARY EXERCISE TEST (CPX)  01/09/2022   Interpretation limited by submaximal effort.  Severe functional impairment when compared to max sedentary norms.  No cardiopulmonary limitations.  Noted tremor and generalized weakness that lead to premature exercise termination-consider neuromuscular evaluation.   COLONOSCOPY N/A 09/22/2017   Procedure: COLONOSCOPY;  Surgeon: Malissa Hippo, MD;  [facility]: [facility];  Service: Endoscopy;  Laterality: N/A;  9:55   COLONOSCOPY WITH PROPOFOL N/A 05/23/2021   Procedure: COLONOSCOPY WITH PROPOFOL;  Surgeon: Dolores Frame, MD;  [facility]: [facility];  Service: Gastroenterology;  Laterality: N/A;  7:30   ESOPHAGOGASTRODUODENOSCOPY (EGD) WITH PROPOFOL N/A 03/18/2021   Procedure: ESOPHAGOGASTRODUODENOSCOPY (EGD) WITH PROPOFOL;  Surgeon: Dolores Frame, MD;  [facility];  Service: Gastroenterology;  Laterality: N/A;   ESOPHAGOGASTRODUODENOSCOPY (EGD) WITH PROPOFOL N/A 05/23/2021   Procedure: ESOPHAGOGASTRODUODENOSCOPY (EGD) WITH PROPOFOL;  Surgeon: Dolores Frame, MD;  [facility];  Service: Gastroenterology;  Laterality: N/A;   POLYPECTOMY  09/22/2017   Procedure: POLYPECTOMY;  Surgeon: Malissa Hippo, MD;  [facility];  Service: Endoscopy;;   RIGHT/LEFT HEART CATH AND  CORONARY ANGIOGRAPHY N/A 12/12/2021   Procedure: RIGHT/LEFT HEART CATH AND CORONARY ANGIOGRAPHY;  Surgeon: Dolores Patty, MD;  [facility]: [facility];  Service: Cardiovascular:: Normal Coronaries & Hemodynamics.  EF 50-55%. RA = 4 mmHg, RV = 23/4; PA = 22/6 (14) & PCW = 7; Ao sat = 95%; PA sat = 76%, 76%& SVC sat = 74%Fick cardiac output/index = 7.1/3.0; PVR = 1.0 WU   TRANSTHORACIC ECHOCARDIOGRAM  02/24/2021   EF 60 to 65%.  Normal LV  function.  Normal valves.  Mild RV dilation with normal function.   ZIO PATCH EVENT MONITOR  01/2022   Sinus rhythm with heart rate range 24 to 159 bpm, 1  AVB and Wenckebach block noted along with rare PACs and PVCs.  No arrhythmias.    Social History   Socioeconomic History   Marital status: Single    Spouse name: Not on file   Number of children: Not on file   Years of education: Not on file   Highest education level: Not on file  Occupational History   Not on file  Tobacco Use   Smoking status: Never    Passive exposure: Past   Smokeless tobacco: Never  Vaping Use   Vaping Use: Never used  Substance and Sexual Activity   Alcohol use: Yes    Comment: occ   Drug use: No   Sexual activity: Not on file  Other Topics Concern   Not on file  Social History Narrative   He currently has his own lawn care landscaping service. He previous had 80 yards before he got sick and since decreased. He works around a Interior and spatial designer. One of the chemical names is roundup.    Social Determinants of Health   Financial Resource Strain: Not on file  Food Insecurity: No Food Insecurity (12/27/2022)   Hunger Vital Sign    Worried About Running Out of Food in the Last Year: Never true    Ran Out of Food in the Last Year: Never true  Transportation Needs: No Transportation Needs (12/27/2022)   PRAPARE - Administrator, Civil Service (Medical): No    Lack of Transportation (Non-Medical): No  Physical Activity: Insufficiently Active (03/29/2022)   Exercise Vital Sign    Days of Exercise per Week: 2 days    Minutes of Exercise per Session: 10 min  Stress: Stress Concern Present (05/07/2022)   [employer] of Occupational Health - Occupational Stress Questionnaire    Feeling of Stress : To some extent  Social Connections: Not on file  Intimate Partner Violence: Not At Risk (12/27/2022)   Humiliation, Afraid, Rape, and Kick questionnaire    Fear of Current or Ex-Partner: No     Emotionally Abused: No    Physically Abused: No    Sexually Abused: No    Family History  Problem Relation Age of Onset   Healthy Mother    Cervical cancer Mother    Cancer Father    Colon cancer Maternal Grandmother    Colon cancer Maternal Grandfather     Anti-infectives: Anti-infectives (From admission, onward)    None       Current Outpatient Medications  Medication Sig Dispense Refill   albuterol (VENTOLIN HFA) 108 (90 Base) MCG/ACT inhaler INHALE 2 PUFFS BY MOUTH EVERY 6 HOURS AS NEEDED FOR SHORTNESS OF BREATH 9 g 0   amLODipine (NORVASC) 5 MG tablet Take 1 tablet (5 mg total) by mouth daily. 90 tablet 1   aspirin  EC 81 MG tablet Take 81 mg by mouth daily. Swallow whole.     b complex vitamins capsule Take 1 capsule by mouth daily. With D3 gummie     betamethasone dipropionate 0.05 % lotion Apply 1-2 times daily to affected itchy areas at scalp. Avoid applying to face, groin, and axilla. Use as directed. Long-term use can cause thinning of the skin. 60 mL 2   cetirizine (ZYRTEC) 10 MG tablet Take 10 mg by mouth daily.     Cholecalciferol (VITAMIN D-3 PO) Take by mouth.     clindamycin (CLEOCIN T) 1 % external solution Apply topically to acne bumps at aa's qd 30 mL 3   clobetasol (TEMOVATE) 0.05 % external solution Apply 1 application. topically 2 (two) times daily. Use as directed.  Mix with cerave cream. Use for up to 4 weeks. Avoid applying to face, groin, and axilla. Use as directed. 50 mL 1   clobetasol cream (TEMOVATE) 0.05 % Apply small amount daily before bed to right ankle or other areas of body as needed for itchy rash Avoid applying to face, groin, and axilla Use as directed. 30 g 0   cyclobenzaprine (FLEXERIL) 5 MG tablet Take 1 tablet (5 mg total) by mouth at bedtime as needed for muscle spasms. 30 tablet 1   diphenhydrAMINE-zinc acetate (BENADRYL EXTRA STRENGTH) cream Apply 1 application topically 3 (three) times daily as needed for itching. 28.4 g 0   docusate  sodium (COLACE) 100 MG capsule Take 100 mg by mouth 2 (two) times daily.     DULoxetine (CYMBALTA) 30 MG capsule Take 60 mg by mouth daily.     dupilumab (DUPIXENT) 300 MG/2ML prefilled syringe Inject 300 mg into the skin every 14 (fourteen) days. Starting at day 15 for maintenance. 4 mL 5   Dupilumab (DUPIXENT) 300 MG/2ML SOPN Inject 300 mg into the skin every 14 (fourteen) days. Starting at day 15 for maintenance. 4 mL 5   fenofibrate (TRICOR) 145 MG tablet Take 1 tablet (145 mg total) by mouth daily. 90 tablet 3   Fluocinolone Acetonide 0.01 % OIL Use 1 - 2 drop qd/bid prn for scale at ears 20 mL 2   fluocinonide (LIDEX) 0.05 % external solution Apply once or twice daily to affected areas on scalp up to 2 weeks as needed for itching. Avoid applying to face, groin, and axilla. 60 mL 2   FLUoxetine (PROZAC) 20 MG capsule Take 1 capsule (20 mg total) by mouth daily. 90 capsule 1   fluticasone (FLONASE ALLERGY RELIEF) 50 MCG/ACT nasal spray Place 2 sprays into both nostrils daily as needed for allergies or rhinitis. (Patient taking differently: Place 2 sprays into both nostrils daily.) 9.9 mL 2   fluticasone-salmeterol (ADVAIR) 250-50 MCG/ACT AEPB as needed.     gabapentin (NEURONTIN) 300 MG capsule Take 300 mg by mouth 3 (three) times daily.     guaiFENesin (MUCINEX) 600 MG 12 hr tablet Take by mouth.     hydrOXYzine (VISTARIL) 25 MG capsule Take 1 capsule (25 mg total) by mouth every 8 (eight) hours as needed. Take before bed as needed for itchy. Can cause drowsiness. 60 capsule 2   ketoconazole (NIZORAL) 2 % shampoo Apply 1 Application topically daily as needed for irritation. 120 mL 11   lubiprostone (AMITIZA) 8 MCG capsule Take 1 capsule (8 mcg total) by mouth 2 (two) times daily with a meal. 180 capsule 3   montelukast (SINGULAIR) 10 MG tablet Take 1 tablet (10 mg total) by mouth  at bedtime. 30 tablet 5   naproxen (NAPROSYN) 500 MG tablet Take 500 mg by mouth 2 (two) times daily.     omega-3  acid ethyl esters (LOVAZA) 1 g capsule Take 2 capsules (2 g total) by mouth 2 (two) times daily. 120 capsule 3   omeprazole (PRILOSEC) 40 MG capsule Take 1 capsule by mouth twice daily 180 capsule 1   ondansetron (ZOFRAN) 4 MG tablet Take 1 tablet (4 mg total) by mouth every 6 (six) hours as needed for nausea. 20 tablet 0   potassium gluconate (RA POTASSIUM GLUCONATE) 595 (99 K) MG TABS tablet Take 1 tablet by mouth daily.     propranolol (INDERAL) 20 MG tablet Take one tablet 20 mg  twice daily  may take an  additional dose of 20 mg  as needed for palpitations 190 tablet 3   rosuvastatin (CRESTOR) 40 MG tablet Take 1 tablet (40 mg total) by mouth daily. 90 tablet 3   senna (SENOKOT) 8.6 MG tablet Take 1 tablet by mouth daily. prn     tacrolimus (PROTOPIC) 0.1 % ointment APPLY TOPICALLY TO AFFECTED AREAS OF BODY FOR RASH DAILY OR TWICE DAILY AS NEEDED 30 g 0   testosterone (ANDROGEL) 50 MG/5GM (1%) GEL Place 5 g onto the skin daily. 150 g 5   triamcinolone cream (KENALOG) 0.1 %      No current facility-administered medications for this visit.     Objective: Vital signs in last 24 hours: BP 119/80   Pulse 96   Intake/Output from previous day: No intake/output data recorded. Intake/Output this shift: @IOTHISSHIFT @   Physical Exam  Lab Results:  No results found for this or any previous visit (from the past 24 hour(s)).  BMET No results for input(s): "NA", "K", "CL", "CO2", "GLUCOSE", "BUN", "CREATININE", "CALCIUM" in the last 72 hours. PT/INR No results for input(s): "LABPROT", "INR" in the last 72 hours. ABG No results for input(s): "PHART", "HCO3" in the last 72 hours.  Invalid input(s): "PCO2", "PO2" UA has 1+ protein. Recent Results (from the past 2160 hour(s))  Urinalysis, Routine w reflex microscopic     Status: Abnormal   Collection Time: 02/06/23 10:07 AM  Result Value Ref Range   Specific Gravity, UA 1.020 1.005 - 1.030   pH, UA 7.5 5.0 - 7.5   Color, UA Yellow  Yellow   Appearance Ur Clear Clear   Leukocytes,UA Negative Negative   Protein,UA 2+ (A) Negative/Trace   Glucose, UA Negative Negative   Ketones, UA Negative Negative   RBC, UA Negative Negative   Bilirubin, UA Negative Negative   Urobilinogen, Ur 1.0 0.2 - 1.0 mg/dL   Nitrite, UA Negative Negative   Microscopic Examination See below:     Comment: Microscopic was indicated and was performed.  Microscopic Examination     Status: Abnormal   Collection Time: 02/06/23 10:07 AM   Urine  Result Value Ref Range   WBC, UA None seen 0 - 5 /hpf   RBC, Urine None seen 0 - 2 /hpf   Epithelial Cells (non renal) 0-10 0 - 10 /hpf   Mucus, UA Present (A) Not Estab.   Bacteria, UA None seen None seen/Few  PSA, total and free     Status: None   Collection Time: 02/06/23 10:49 AM  Result Value Ref Range   Prostate Specific Ag, Serum 0.7 0.0 - 4.0 ng/mL    Comment: Roche ECLIA methodology. According to the American Urological Association, Serum PSA should decrease and remain  at undetectable levels after radical prostatectomy. The AUA defines biochemical recurrence as an initial PSA value 0.2 ng/mL or greater followed by a subsequent confirmatory PSA value 0.2 ng/mL or greater. Values obtained with different assay methods or kits cannot be used interchangeably. Results cannot be interpreted as absolute evidence of the presence or absence of malignant disease.    PSA, Free 0.14 N/A ng/mL    Comment: Roche ECLIA methodology.   PSA, Free Pct 20.0 %    Comment: The table below lists the probability of prostate cancer for men with non-suspicious DRE results and total PSA between 4 and 10 ng/mL, by patient age Damaris Schooner, JAMA 1998, 161:0960).                   % Free PSA       50-64 yr        65-75 yr                   0.00-10.00%        56%             55%                  10.01-15.00%        24%             35%                  15.01-20.00%        17%             23%                   20.01-25.00%        10%             20%                       >25.00%         5%              9% Please note:  Catalona et al did not make specific               recommendations regarding the use of               percent free PSA for any other population               of men.   Follicle stimulating hormone     Status: None   Collection Time: 02/06/23 10:49 AM  Result Value Ref Range   FSH 2.9 1.5 - 12.4 mIU/mL  Luteinizing hormone     Status: None   Collection Time: 02/06/23 10:49 AM  Result Value Ref Range   LH 3.1 1.7 - 8.6 mIU/mL  Estradiol     Status: None   Collection Time: 02/06/23 10:49 AM  Result Value Ref Range   Estradiol 27.1 7.6 - 42.6 pg/mL    Comment: Roche ECLIA methodology  Prolactin     Status: None   Collection Time: 02/06/23 10:49 AM  Result Value Ref Range   Prolactin 9.8 3.9 - 22.7 ng/mL  Testosterone Free, Profile I     Status: None   Collection Time: 02/06/23 10:49 AM  Result Value Ref Range   Albumin 4.7 4.1 - 5.1 g/dL   Testosterone 454 098 - 916 ng/dL    Comment: Adult male reference interval is based on a population of  healthy nonobese males (BMI <30) between 72 and 61 years old. Travison, et.al. JCEM [phone]. PMID: 91478295.    Sex Hormone Binding 17.6 16.5 - 55.9 nmol/L   Testost., Free, Calc 91.6 30.3 - 183.2 pg/mL  Estradiol     Status: None   Collection Time: 03/24/23  9:24 AM  Result Value Ref Range   Estradiol 16.7 7.6 - 42.6 pg/mL    Comment: Roche ECLIA methodology  FSH/LH     Status: None   Collection Time: 03/24/23  9:24 AM  Result Value Ref Range   LH 2.9 1.7 - 8.6 mIU/mL   FSH 2.7 1.5 - 12.4 mIU/mL  Testosterone,Free and Total     Status: Abnormal   Collection Time: 03/24/23  9:24 AM  Result Value Ref Range   Testosterone 254 (L) 264 - 916 ng/dL    Comment: Adult male reference interval is based on a population of healthy nonobese males (BMI <30) between 74 and 19 years old. Travison, et.al. JCEM [phone].  PMID: [id number].    Testosterone, Free 8.5 6.8 - 21.5 pg/mL  Lipid Profile     Status: Abnormal   Collection Time: 03/24/23  9:24 AM  Result Value Ref Range   Cholesterol, Total 133 100 - 199 mg/dL   Triglycerides 440 (H) 0 - 149 mg/dL   HDL 36 (L) >10 mg/dL   VLDL Cholesterol Cal 40 5 - 40 mg/dL   LDL Chol Calc (NIH) 57 0 - 99 mg/dL   Chol/HDL Ratio 3.7 0.0 - 5.0 ratio    Comment:                                   T. Chol/HDL Ratio                                             Men  Women                               1/2 Avg.Risk  3.4    3.3                                   Avg.Risk  5.0    4.4                                2X Avg.Risk  9.6    7.1                                3X Avg.Risk 23.4   11.0   Hemoglobin A1c     Status: Abnormal   Collection Time: 03/24/23  9:24 AM  Result Value Ref Range   Hgb A1c MFr Bld 6.1 (H) 4.8 - 5.6 %    Comment:          Prediabetes: 5.7 - 6.4          Diabetes: >6.4          Glycemic control for adults with diabetes: <7.0    Est. average glucose Bld gHb Est-mCnc 128 mg/dL  [id number]  Status: Abnormal   Collection Time: 03/24/23  9:24 AM  Result Value Ref Range   Glucose 113 (H) 70 - 99 mg/dL   BUN 15 6 - 24 mg/dL   Creatinine, Ser 0.98 (H) 0.76 - 1.27 mg/dL   eGFR 58 (L) >11 BJ/YNW/2.95   BUN/Creatinine Ratio 10 9 - 20   Sodium 142 134 - 144 mmol/L   Potassium 4.8 3.5 - 5.2 mmol/L   Chloride 103 96 - 106 mmol/L   CO2 23 20 - 29 mmol/L   Calcium 10.0 8.7 - 10.2 mg/dL   Total Protein 7.4 6.0 - 8.5 g/dL   Albumin 4.9 4.1 - 5.1 g/dL   Globulin, Total 2.5 1.5 - 4.5 g/dL   Albumin/Globulin Ratio 2.0 1.2 - 2.2   Bilirubin Total 0.5 0.0 - 1.2 mg/dL   Alkaline Phosphatase 74 44 - 121 IU/L   AST 46 (H) 0 - 40 IU/L   ALT 73 (H) 0 - 44 IU/L    Studies/Results:   Assessment/Plan: Hypogonadism with fatigue.  His repeat free/total T were normal in March but the total T was low prior to this visit.  His prolactin and estradiol were  normal but his gonadotropins were on the low side.  I was going to consider clomiphene but he has liver disease.   I will try him on testim and reviewed the side effects.  He will return in a month for labs and then in 3 months for an OV with labs.   Proteinuria.  He has CKD 3 which is the probably culprit for the protein.   Meds ordered this encounter  Medications   testosterone (ANDROGEL) 50 MG/5GM (1%) GEL    Sig: Place 5 g onto the skin daily.    Dispense:  150 g    Refill:  5     Orders Placed This Encounter  Procedures   Urinalysis, Routine w reflex microscopic   Testosterone    Standing Status:   Future    Standing Expiration Date:   07/11/2023   Hemoglobin and hematocrit, blood    Standing Status:   Future    Standing Expiration Date:   07/11/2023   Testosterone    Standing Status:   Future    Standing Expiration Date:   10/11/2023   Hemoglobin and hematocrit, blood    Standing Status:   Future    Standing Expiration Date:   10/11/2023     Return in about 3 months (around 07/11/2023) for labs in 1 month and 3 months with f/u. [name]    CC: Dr. Richardean Chimera     Bjorn Pippin 04/11/2023

## 2023-04-10 NOTE — Telephone Encounter (Signed)
I spoke with Majestic at [facility] she says they need the patient to bring his insurance card by to them. I asked if they were needing anything from this office she says no. I spoke with the patient and made him aware he will need to take his insurance card by the pharmacy.

## 2023-04-11 ENCOUNTER — Telehealth (INDEPENDENT_AMBULATORY_CARE_PROVIDER_SITE_OTHER): Payer: Self-pay

## 2023-04-11 NOTE — Telephone Encounter (Signed)
We received at message from the patient insurance stating the patient insurance will only pay for # 90 in a 365 day. Left message for the patient to return call.

## 2023-04-11 NOTE — Telephone Encounter (Signed)
I did make the patient aware that his insurance was requiring him to just reieve # 90 capsules in a years time. Advised that he may have to get the medication with a Good Rx card at [facility] with the price of # 60 being $24.15 and this is the lowest price. If getting # 60 at [facility] the price would be $147.44. Patient made aware I will call him when we hear back from insurance to see if they will pay.   Did Pa part one on the patient as Cover my meds says the demographics have been sent to plan. Please check back at later time for the questions.

## 2023-04-11 NOTE — Telephone Encounter (Signed)
I called and left a detailed message that the insurance had approved his medication and he could reach out to [facility] to pick it up and if any issues to call the office.

## 2023-04-11 NOTE — Telephone Encounter (Signed)
Your PA request has been approved. Additional information will be provided in the approval communication. (Message [id number]).  

## 2023-04-14 ENCOUNTER — Telehealth (INDEPENDENT_AMBULATORY_CARE_PROVIDER_SITE_OTHER): Payer: Self-pay | Admitting: *Deleted

## 2023-04-14 ENCOUNTER — Telehealth: Payer: Self-pay

## 2023-04-14 NOTE — Telephone Encounter (Signed)
-----   Message from Ferdinand Lango, RN sent at 04/13/2023  8:01 PM EDT ----- Medication denial

## 2023-04-14 NOTE — Telephone Encounter (Signed)
Insurance denied androgel rx sent in on 05/30.  Below are the formulary alternatives.

## 2023-04-14 NOTE — Telephone Encounter (Signed)
Fax from [place]. Omeprazole 40mg  is approved from 04/11/23 - 11/10/2038. ( That is the date on letter 2039 but I guessing they meant 2024). Letter of approval was faxed to pharmacy.

## 2023-04-15 ENCOUNTER — Other Ambulatory Visit: Payer: Self-pay | Admitting: Urology

## 2023-04-15 DIAGNOSIS — E291 Testicular hypofunction: Secondary | ICD-10-CM

## 2023-04-15 MED ORDER — TESTOSTERONE 25 MG/2.5GM (1%) TD GEL: 5.0000 g | Freq: Every morning | TRANSDERMAL | 5 refills | Status: DC

## 2023-04-15 NOTE — Telephone Encounter (Signed)
Pharmacy called needing clarification on testosterone prescription. Pharmacist states for a 30 day supply ( 2 pks a day) quantity should be 180 g instead of 150 g. She states 30 pks equals 90 g.

## 2023-04-15 NOTE — Telephone Encounter (Signed)
I'm unable to send in/update Testosterone. That has to be sent in by MD.

## 2023-04-15 NOTE — Telephone Encounter (Signed)
Patient notified via mychart

## 2023-04-16 ENCOUNTER — Other Ambulatory Visit: Payer: Self-pay | Admitting: Urology

## 2023-04-16 DIAGNOSIS — E291 Testicular hypofunction: Secondary | ICD-10-CM

## 2023-04-16 MED ORDER — TESTOSTERONE 25 MG/2.5GM (1%) TD GEL: 5.0000 g | Freq: Every morning | TRANSDERMAL | 5 refills | Status: DC

## 2023-04-16 NOTE — Telephone Encounter (Signed)
I spoke with the pharmacist and clarified MD orders on tesosterone 2.5g.  They pharmacy was trying to fill the original rx for testosterone 50mg /5g.  The pharmacist is requesting you resend the Testosterone 25mg /2.5g packets as originally rx'd and she states she can adjust qty.   Can you please re-send in rx.

## 2023-04-16 NOTE — Telephone Encounter (Signed)
Can you follow up on this patient please

## 2023-04-18 ENCOUNTER — Other Ambulatory Visit: Payer: Self-pay | Admitting: Urology

## 2023-04-18 ENCOUNTER — Other Ambulatory Visit: Payer: Self-pay

## 2023-04-18 DIAGNOSIS — E291 Testicular hypofunction: Secondary | ICD-10-CM

## 2023-04-18 MED ORDER — "SYRINGE/NEEDLE (DISP) 22G X 1-1/2"" 3 ML MISC": 12 refills | Status: DC

## 2023-04-18 MED ORDER — TESTOSTERONE CYPIONATE 200 MG/ML IM SOLN: 100.0000 mg | INTRAMUSCULAR | 1 refills | Status: DC

## 2023-04-18 MED ORDER — "NEEDLE (DISP) 18G X 1-1/2"" MISC": 12 refills | Status: DC

## 2023-04-18 NOTE — Telephone Encounter (Signed)
Insurance denied testosterone gel 25/2.5 see below.  Patient would like to try testosterone injections at this time.

## 2023-04-18 NOTE — Telephone Encounter (Signed)
PA for testosterone injection completed today. Key: Harrel Carina

## 2023-04-21 ENCOUNTER — Telehealth: Payer: Self-pay

## 2023-04-21 NOTE — Telephone Encounter (Signed)
Patient called in today left message concerning medicine and wanted to cancel his up coming appointment.

## 2023-04-22 ENCOUNTER — Ambulatory Visit: Payer: 59

## 2023-04-22 NOTE — Telephone Encounter (Signed)
Apt date moved out, patient aware I will contact insurance.

## 2023-04-23 ENCOUNTER — Ambulatory Visit (INDEPENDENT_AMBULATORY_CARE_PROVIDER_SITE_OTHER): Payer: 59

## 2023-04-23 ENCOUNTER — Ambulatory Visit: Payer: 59

## 2023-04-23 DIAGNOSIS — L209 Atopic dermatitis, unspecified: Secondary | ICD-10-CM

## 2023-04-23 MED ORDER — DUPILUMAB 300 MG/2ML ~~LOC~~ SOSY
300.0000 mg | PREFILLED_SYRINGE | Freq: Once | SUBCUTANEOUS | Status: AC
Start: 2023-04-23 — End: 2023-04-23
  Administered 2023-04-23: 300 mg via SUBCUTANEOUS

## 2023-04-23 NOTE — Progress Notes (Signed)
Patient here today for 2 week Dupixent injection for Severe Atopic Dermatitis.   Dupixent 300mg  injected into patients right upper arm. Patient tolerated procedure well.    Lot: [id number] Exp: 06/2025 NDC: [id number]   Dorathy Daft RMA

## 2023-04-23 NOTE — Telephone Encounter (Signed)
Patient is made aware Testerone medication approve through insurance. Patient voiced understanding

## 2023-04-24 ENCOUNTER — Ambulatory Visit (INDEPENDENT_AMBULATORY_CARE_PROVIDER_SITE_OTHER): Payer: 59

## 2023-04-24 DIAGNOSIS — R5383 Other fatigue: Secondary | ICD-10-CM | POA: Diagnosis not present

## 2023-04-24 DIAGNOSIS — E291 Testicular hypofunction: Secondary | ICD-10-CM | POA: Diagnosis not present

## 2023-04-24 MED ORDER — TESTOSTERONE CYPIONATE 200 MG/ML IM SOLN
100.0000 mg | INTRAMUSCULAR | Status: DC
Start: 2023-04-24 — End: 2023-07-10
  Administered 2023-04-24: 100 mg via INTRAMUSCULAR

## 2023-04-24 NOTE — Patient Instructions (Signed)

## 2023-04-24 NOTE — Progress Notes (Addendum)
Patient present for Testerone injection teaching. Patient administer Testerone injection Medication: Testerone Dose: 100 mg Location: Left Anterior thigh Lot: [id number] Exp: [id number]  Patient tolerated well, no complications were noted

## 2023-05-01 ENCOUNTER — Other Ambulatory Visit: Payer: Self-pay | Admitting: Internal Medicine

## 2023-05-01 DIAGNOSIS — R0609 Other forms of dyspnea: Secondary | ICD-10-CM

## 2023-05-07 ENCOUNTER — Ambulatory Visit (INDEPENDENT_AMBULATORY_CARE_PROVIDER_SITE_OTHER): Payer: 59

## 2023-05-07 DIAGNOSIS — L209 Atopic dermatitis, unspecified: Secondary | ICD-10-CM

## 2023-05-07 MED ORDER — DUPILUMAB 300 MG/2ML ~~LOC~~ SOSY
300.0000 mg | PREFILLED_SYRINGE | Freq: Once | SUBCUTANEOUS | Status: AC
Start: 2023-05-07 — End: 2023-05-07
  Administered 2023-05-07: 300 mg via SUBCUTANEOUS

## 2023-05-07 NOTE — Progress Notes (Signed)
Patient here today for 2 week Dupixent injection for Severe Atopic Dermatitis.   Dupixent 300mg  injected into patients left upper arm. Patient tolerated procedure well.    Lot: [id number] Exp: 02/2025 NDC: [id number]  Evorn Gong RMA

## 2023-05-08 ENCOUNTER — Other Ambulatory Visit: Payer: 59

## 2023-05-08 DIAGNOSIS — E291 Testicular hypofunction: Secondary | ICD-10-CM

## 2023-05-09 LAB — TESTOSTERONE: Testosterone: 424 ng/dL (ref 264–916)

## 2023-05-09 LAB — HEMOGLOBIN AND HEMATOCRIT, BLOOD
Hematocrit: 48.7 % (ref 37.5–51.0)
Hemoglobin: 16.1 g/dL (ref 13.0–17.7)

## 2023-05-14 ENCOUNTER — Ambulatory Visit (INDEPENDENT_AMBULATORY_CARE_PROVIDER_SITE_OTHER): Payer: 59 | Admitting: Internal Medicine

## 2023-05-14 ENCOUNTER — Encounter: Payer: Self-pay | Admitting: Internal Medicine

## 2023-05-14 VITALS — BP 124/82 | HR 74 | Ht 74.0 in | Wt 270.0 lb

## 2023-05-14 DIAGNOSIS — R61 Generalized hyperhidrosis: Secondary | ICD-10-CM | POA: Diagnosis not present

## 2023-05-14 DIAGNOSIS — E291 Testicular hypofunction: Secondary | ICD-10-CM

## 2023-05-14 DIAGNOSIS — G629 Polyneuropathy, unspecified: Secondary | ICD-10-CM | POA: Insufficient documentation

## 2023-05-14 DIAGNOSIS — F419 Anxiety disorder, unspecified: Secondary | ICD-10-CM

## 2023-05-14 NOTE — Patient Instructions (Addendum)
Please start taking Prozac once every other day for the next 2 weeks and then stop taking it.  Please continue taking Cymbalta as prescribed.

## 2023-05-14 NOTE — Progress Notes (Signed)
Acute Office Visit  Subjective:    Patient ID: Elijah Shaffer, male    DOB: 1981-10-30, 42 y.o.   MRN: [id number]  Chief Complaint  Patient presents with   Night Sweats    Patient states he wakes up drenched in sweat    HPI Patient is in today for complaint of excessive sweating at nighttime for the last 2 months.  He denies any recent weight loss, LAD or change in appetite.  Of note, he is currently taking Prozac 20 mg QD for anxiety.  She was later placed on Cymbalta for neuropathy by neurologist.  He also reports starting testosterone supplement recently, which could have contributed to night sweats.  Past Medical History:  Diagnosis Date   Acute renal injury (HCC) 06/28/2017   Anxiety    Aspiration pneumonia of right upper lobe due to gastric secretions (HCC)    Diverticulitis    GERD (gastroesophageal reflux disease) 01/08/2018   Seasonal allergies    Syncope 06/26/2021    Past Surgical History:  Procedure Laterality Date   BIOPSY  03/18/2021   Procedure: BIOPSY;  Surgeon: Dolores Frame, MD;  [facility]: [facility];  Service: Gastroenterology;;  esophageal at Z-line   BIOPSY  05/23/2021   Procedure: BIOPSY;  Surgeon: Dolores Frame, MD;  Location: [facility];  Service: Gastroenterology;;   CARDIOPULMONARY EXERCISE TEST (CPX)  01/09/2022   Interpretation limited by submaximal effort.  Severe functional impairment when compared to max sedentary norms.  No cardiopulmonary limitations.  Noted tremor and generalized weakness that lead to premature exercise termination-consider neuromuscular evaluation.   COLONOSCOPY N/A 09/22/2017   Procedure: COLONOSCOPY;  Surgeon: Malissa Hippo, MD;  Location: [facility];  Service: Endoscopy;  Laterality: N/A;  9:55   COLONOSCOPY WITH PROPOFOL N/A 05/23/2021   Procedure: COLONOSCOPY WITH PROPOFOL;  Surgeon: Dolores Frame, MD;  Location: [facility];  Service: Gastroenterology;  Laterality: N/A;   7:30   ESOPHAGOGASTRODUODENOSCOPY (EGD) WITH PROPOFOL N/A 03/18/2021   Procedure: ESOPHAGOGASTRODUODENOSCOPY (EGD) WITH PROPOFOL;  Surgeon: Dolores Frame, MD;  [facility]: [facility];  Service: Gastroenterology;  Laterality: N/A;   ESOPHAGOGASTRODUODENOSCOPY (EGD) WITH PROPOFOL N/A 05/23/2021   Procedure: ESOPHAGOGASTRODUODENOSCOPY (EGD) WITH PROPOFOL;  Surgeon: Dolores Frame, MD;  Location: [facility];  Service: Gastroenterology;  Laterality: N/A;   POLYPECTOMY  09/22/2017   Procedure: POLYPECTOMY;  Surgeon: Malissa Hippo, MD;  Location: [facility];  Service: Endoscopy;;   RIGHT/LEFT HEART CATH AND CORONARY ANGIOGRAPHY N/A 12/12/2021   Procedure: RIGHT/LEFT HEART CATH AND CORONARY ANGIOGRAPHY;  Surgeon: Dolores Patty, MD;  Location: [facility];  Service: Cardiovascular:: Normal Coronaries & Hemodynamics.  EF 50-55%. RA = 4 mmHg, RV = 23/4; PA = 22/6 (14) & PCW = 7; Ao sat = 95%; PA sat = 76%, 76%& SVC sat = 74%Fick cardiac output/index = 7.1/3.0; PVR = 1.0 WU   TRANSTHORACIC ECHOCARDIOGRAM  02/24/2021   EF 60 to 65%.  Normal LV function.  Normal valves.  Mild RV dilation with normal function.   ZIO PATCH EVENT MONITOR  01/2022   Sinus rhythm with heart rate range 24 to 159 bpm, 1  AVB and Wenckebach block noted along with rare PACs and PVCs.  No arrhythmias.    Family History  Problem Relation Age of Onset   Healthy Mother    Cervical cancer Mother    Cancer Father    Colon cancer Maternal Grandmother    Colon cancer Maternal  Grandfather     Social History   Socioeconomic History   Marital status: Single    Spouse name: Not on file   Number of children: Not on file   Years of education: Not on file   Highest education level: Not on file  Occupational History   Not on file  Tobacco Use   Smoking status: Never    Passive exposure: Past   Smokeless tobacco: Never  Vaping Use   Vaping Use: Never used  Substance and Sexual Activity    Alcohol use: Yes    Comment: occ   Drug use: No   Sexual activity: Not on file  Other Topics Concern   Not on file  Social History Narrative   He currently has his own lawn care landscaping service. He previous had 80 yards before he got sick and since decreased. He works around a Interior and spatial designer. One of the chemical names is roundup.    Social Determinants of Health   Financial Resource Strain: Not on file  Food Insecurity: No Food Insecurity (12/27/2022)   Hunger Vital Sign    Worried About Running Out of Food in the Last Year: Never true    Ran Out of Food in the Last Year: Never true  Transportation Needs: No Transportation Needs (12/27/2022)   PRAPARE - Administrator, Civil Service (Medical): No    Lack of Transportation (Non-Medical): No  Physical Activity: Insufficiently Active (03/29/2022)   Exercise Vital Sign    Days of Exercise per Week: 2 days    Minutes of Exercise per Session: 10 min  Stress: Stress Concern Present (05/07/2022)   [employer] of Occupational Health - Occupational Stress Questionnaire    Feeling of Stress : To some extent  Social Connections: Not on file  Intimate Partner Violence: Not At Risk (12/27/2022)   Humiliation, Afraid, Rape, and Kick questionnaire    Fear of Current or Ex-Partner: No    Emotionally Abused: No    Physically Abused: No    Sexually Abused: No    Outpatient Medications Prior to Visit  Medication Sig Dispense Refill   albuterol (VENTOLIN HFA) 108 (90 Base) MCG/ACT inhaler INHALE 2 PUFFS BY MOUTH EVERY 6 HOURS AS NEEDED FOR SHORTNESS OF BREATH 9 g 0   amLODipine (NORVASC) 5 MG tablet Take 1 tablet (5 mg total) by mouth daily. 90 tablet 1   aspirin EC 81 MG tablet Take 81 mg by mouth daily. Swallow whole.     b complex vitamins capsule Take 1 capsule by mouth daily. With D3 gummie     betamethasone dipropionate 0.05 % lotion Apply 1-2 times daily to affected itchy areas at scalp. Avoid applying to face,  groin, and axilla. Use as directed. Long-term use can cause thinning of the skin. 60 mL 2   cetirizine (ZYRTEC) 10 MG tablet Take 10 mg by mouth daily.     Cholecalciferol (VITAMIN D-3 PO) Take by mouth.     clindamycin (CLEOCIN T) 1 % external solution Apply topically to acne bumps at aa's qd 30 mL 3   clobetasol (TEMOVATE) 0.05 % external solution Apply 1 application. topically 2 (two) times daily. Use as directed.  Mix with cerave cream. Use for up to 4 weeks. Avoid applying to face, groin, and axilla. Use as directed. 50 mL 1   clobetasol cream (TEMOVATE) 0.05 % Apply small amount daily before bed to right ankle or other areas of body as needed for itchy rash Avoid  applying to face, groin, and axilla Use as directed. 30 g 0   cyclobenzaprine (FLEXERIL) 5 MG tablet Take 1 tablet (5 mg total) by mouth at bedtime as needed for muscle spasms. 30 tablet 1   diphenhydrAMINE-zinc acetate (BENADRYL EXTRA STRENGTH) cream Apply 1 application topically 3 (three) times daily as needed for itching. 28.4 g 0   docusate sodium (COLACE) 100 MG capsule Take 100 mg by mouth 2 (two) times daily.     DULoxetine (CYMBALTA) 30 MG capsule Take 60 mg by mouth daily.     dupilumab (DUPIXENT) 300 MG/2ML prefilled syringe Inject 300 mg into the skin every 14 (fourteen) days. Starting at day 15 for maintenance. 4 mL 5   Dupilumab (DUPIXENT) 300 MG/2ML SOPN Inject 300 mg into the skin every 14 (fourteen) days. Starting at day 15 for maintenance. 4 mL 5   fenofibrate (TRICOR) 145 MG tablet Take 1 tablet (145 mg total) by mouth daily. 90 tablet 3   Fluocinolone Acetonide 0.01 % OIL Use 1 - 2 drop qd/bid prn for scale at ears 20 mL 2   fluocinonide (LIDEX) 0.05 % external solution Apply once or twice daily to affected areas on scalp up to 2 weeks as needed for itching. Avoid applying to face, groin, and axilla. 60 mL 2   fluticasone (FLONASE ALLERGY RELIEF) 50 MCG/ACT nasal spray Place 2 sprays into both nostrils daily as needed  for allergies or rhinitis. (Patient taking differently: Place 2 sprays into both nostrils daily.) 9.9 mL 2   fluticasone-salmeterol (ADVAIR) 250-50 MCG/ACT AEPB as needed.     gabapentin (NEURONTIN) 300 MG capsule Take 300 mg by mouth 3 (three) times daily.     guaiFENesin (MUCINEX) 600 MG 12 hr tablet Take by mouth.     hydrOXYzine (VISTARIL) 25 MG capsule Take 1 capsule (25 mg total) by mouth every 8 (eight) hours as needed. Take before bed as needed for itchy. Can cause drowsiness. 60 capsule 2   ketoconazole (NIZORAL) 2 % shampoo Apply 1 Application topically daily as needed for irritation. 120 mL 11   lubiprostone (AMITIZA) 8 MCG capsule Take 1 capsule (8 mcg total) by mouth 2 (two) times daily with a meal. 180 capsule 3   montelukast (SINGULAIR) 10 MG tablet Take 1 tablet (10 mg total) by mouth at bedtime. 30 tablet 5   naproxen (NAPROSYN) 500 MG tablet Take 500 mg by mouth 2 (two) times daily.     NEEDLE, DISP, 18 G 18G X 1-1/2" MISC Use to draw up testosterone for injection 2 each 12   omega-3 acid ethyl esters (LOVAZA) 1 g capsule Take 2 capsules (2 g total) by mouth 2 (two) times daily. 120 capsule 3   omeprazole (PRILOSEC) 40 MG capsule Take 1 capsule by mouth twice daily 180 capsule 1   ondansetron (ZOFRAN) 4 MG tablet Take 1 tablet (4 mg total) by mouth every 6 (six) hours as needed for nausea. 20 tablet 0   potassium gluconate (RA POTASSIUM GLUCONATE) 595 (99 K) MG TABS tablet Take 1 tablet by mouth daily.     propranolol (INDERAL) 20 MG tablet Take one tablet 20 mg  twice daily  may take an  additional dose of 20 mg  as needed for palpitations 190 tablet 3   rosuvastatin (CRESTOR) 40 MG tablet Take 1 tablet (40 mg total) by mouth daily. 90 tablet 3   senna (SENOKOT) 8.6 MG tablet Take 1 tablet by mouth daily. prn     SYRINGE-NEEDLE, DISP,  3 ML 22G X 1-1/2" 3 ML MISC Use to inject testosterone 2 each 12   tacrolimus (PROTOPIC) 0.1 % ointment APPLY TOPICALLY TO AFFECTED AREAS OF BODY  FOR RASH DAILY OR TWICE DAILY AS NEEDED 30 g 0   testosterone cypionate (DEPOTESTOSTERONE CYPIONATE) 200 MG/ML injection Inject 0.5 mLs (100 mg total) into the muscle every 7 (seven) days. 10 mL 1   triamcinolone cream (KENALOG) 0.1 %      FLUoxetine (PROZAC) 20 MG capsule Take 1 capsule (20 mg total) by mouth daily. 90 capsule 1   Facility-Administered Medications Prior to Visit  Medication Dose Route Frequency Provider Last Rate Last Admin   testosterone cypionate (DEPOTESTOSTERONE CYPIONATE) injection 100 mg  100 mg Intramuscular Weekly Bjorn Pippin, MD   100 mg at 04/24/23 1125    Allergies  Allergen Reactions   Gramineae Pollens Itching   Claritin [Loratadine] Other (See Comments)    Heart race   Loratadine-Pseudoephedrine Er Palpitations   Pollen Extract Other (See Comments)    Seasonal allergies    Review of Systems  Constitutional:  Positive for fatigue. Negative for chills and fever.  HENT:  Negative for congestion and sore throat.   Eyes:  Negative for pain and discharge.  Respiratory:  Positive for shortness of breath. Negative for cough.   Cardiovascular:  Negative for chest pain and palpitations.  Gastrointestinal:  Positive for constipation. Negative for diarrhea, nausea and vomiting.  Endocrine: Negative for polydipsia and polyuria.  Genitourinary:  Negative for dysuria and hematuria.  Musculoskeletal:  Positive for arthralgias, back pain, myalgias and neck pain. Negative for neck stiffness.  Neurological:  Positive for tremors. Negative for dizziness and headaches.  Psychiatric/Behavioral:  Positive for sleep disturbance. Negative for agitation and behavioral problems. The patient is nervous/anxious.        Objective:    Physical Exam Vitals reviewed.  Constitutional:      General: He is not in acute distress.    Appearance: He is not diaphoretic.  HENT:     Head: Normocephalic and atraumatic.     Nose: Nose normal.     Mouth/Throat:     Mouth: Mucous  membranes are moist.  Eyes:     General: No scleral icterus.    Extraocular Movements: Extraocular movements intact.  Cardiovascular:     Rate and Rhythm: Normal rate and regular rhythm.     Pulses: Normal pulses.     Heart sounds: Normal heart sounds. No murmur heard. Pulmonary:     Breath sounds: Normal breath sounds. No wheezing or rales.  Musculoskeletal:        General: Tenderness (Mid thoracic and lumbar spine area) present.     Cervical back: Neck supple. No tenderness.     Right lower leg: No edema.     Left lower leg: No edema.  Skin:    General: Skin is warm.     Coloration: Skin is not jaundiced.  Neurological:     General: No focal deficit present.     Mental Status: He is alert and oriented to person, place, and time.     Sensory: No sensory deficit.     Motor: No weakness.     Comments: Functional tremors on right hand  Psychiatric:        Mood and Affect: Mood is anxious and depressed. Affect is flat.        Thought Content: Thought content does not include homicidal or suicidal ideation.     BP 124/82 (BP Location:  Right Arm, Patient Position: Sitting, Cuff Size: Large)   Pulse 74   Ht 6\' 2"  (1.88 m)   Wt 270 lb (122.5 kg)   SpO2 94%   BMI 34.67 kg/m  Wt Readings from Last 3 Encounters:  05/14/23 270 lb (122.5 kg)  03/24/23 264 lb 9.6 oz (120 kg)  02/06/23 242 lb (109.8 kg)        Assessment & Plan:   Problem List Items Addressed This Visit       Endocrine   Hypogonadism, male    Testosterone level was WNL recently On testosterone injections now Followed by urology If persistent night sweats, may need to switch to testosterone cream or discontinue        Nervous and Auditory   Neuropathy    Has been evaluated by [facility] neurology On Cymbalta currently        Other   Anxiety (Chronic)    Overall well-controlled now On Prozac 20 mg QD now Was later placed on Cymbalta for neuropathy Prozac and Cymbalta together might be causing  excessive sweating, taper and discontinue Prozac Hydroxyzine as needed for anxiety      Night sweats - Primary    Excessive night sweats could be due to combined effects of Prozac and Cymbalta Taper and discontinue Prozac Recently started testosterone supplement might also be contributing-if persistent night sweats, may need to change from testosterone injection to cream        No orders of the defined types were placed in this encounter.    Anabel Halon, MD

## 2023-05-14 NOTE — Assessment & Plan Note (Signed)
Excessive night sweats could be due to combined effects of Prozac and Cymbalta Taper and discontinue Prozac Recently started testosterone supplement might also be contributing-if persistent night sweats, may need to change from testosterone injection to cream

## 2023-05-14 NOTE — Assessment & Plan Note (Signed)
Testosterone level was WNL recently On testosterone injections now Followed by urology If persistent night sweats, may need to switch to testosterone cream or discontinue

## 2023-05-14 NOTE — Assessment & Plan Note (Signed)
Has been evaluated by [facility] neurology On Cymbalta currently

## 2023-05-14 NOTE — Assessment & Plan Note (Signed)
Overall well-controlled now On Prozac 20 mg QD now Was later placed on Cymbalta for neuropathy Prozac and Cymbalta together might be causing excessive sweating, taper and discontinue Prozac Hydroxyzine as needed for anxiety

## 2023-05-21 ENCOUNTER — Ambulatory Visit: Payer: 59 | Admitting: Dermatology

## 2023-05-21 VITALS — BP 119/91 | HR 76

## 2023-05-21 DIAGNOSIS — S80862A Insect bite (nonvenomous), left lower leg, initial encounter: Secondary | ICD-10-CM | POA: Diagnosis not present

## 2023-05-21 DIAGNOSIS — Z79899 Other long term (current) drug therapy: Secondary | ICD-10-CM | POA: Diagnosis not present

## 2023-05-21 DIAGNOSIS — L299 Pruritus, unspecified: Secondary | ICD-10-CM

## 2023-05-21 DIAGNOSIS — L2081 Atopic neurodermatitis: Secondary | ICD-10-CM | POA: Diagnosis not present

## 2023-05-21 DIAGNOSIS — W57XXXA Bitten or stung by nonvenomous insect and other nonvenomous arthropods, initial encounter: Secondary | ICD-10-CM | POA: Diagnosis not present

## 2023-05-21 NOTE — Progress Notes (Signed)
Follow-Up Visit   Subjective  Elijah Shaffer is a 42 y.o. male who presents for the following: atopic dermatitis - patient currently using Hydroxyzine 25 mg po BID, Clobetasol cream QD-BID, and Dupixent 300mg /22mL injections SQ Q2W, but continues to itch. Patient says Dupixent worked well at first, but he thinks it is no longer helping. Pt c/o tick bite on the L lower calf for which he is using Clobetasol 0.05% cream, and he would like area checked today.   The following portions of the chart were reviewed this encounter and updated as appropriate: medications, allergies, medical history  Review of Systems:  No other skin or systemic complaints except as noted in HPI or Assessment and Plan.  Objective  Well appearing patient in no apparent distress; mood and affect are within normal limits.  A focused examination was performed of the following areas: the face, arms, back, stomach   Relevant exam findings are noted in the Assessment and Plan.    Assessment & Plan   ATOPIC NEURODERMATITIS with persistent pruritus   Exam: itching on the back, arms, and scalp, mild erythema with lichenification on the arms, scattered pink papules on the back. 12% BSA  Chronic and persistent condition with duration or expected duration over one year. Condition is bothersome/symptomatic for patient. Currently flared.  Atopic dermatitis - Severe, on Dupixent (biologic medication).  Atopic dermatitis (eczema) is a chronic, relapsing, pruritic condition that can significantly affect quality of life. It is often associated with allergic rhinitis and/or asthma and can require treatment with topical medications, phototherapy, or in severe cases biologic medications, which require long term medication management.    Treatment Plan:  D/C Dupixent since patient feels it's no longer helping with itching.   Start Rinvoq 15 mg po QD. Samples given x 2. baseline labs ordered today.  Continue Hydroxyzine 25 mg  po BID.   Continue Clobetasol 0.05% cream to aa's QD-BID PRN. Topical steroids (such as triamcinolone, fluocinolone, fluocinonide, mometasone, clobetasol, halobetasol, betamethasone, hydrocortisone) can cause thinning and lightening of the skin if they are used for too long in the same area. Your physician has selected the right strength medicine for your problem and area affected on the body. Please use your medication only as directed by your physician to prevent side effects.   Discussed potential risks/benefits of JAK inhibitors (Rinvoq/Cibinqo) which are new oral biologic medications used for treating severe, refractory atopic dermatitis.  They are very effective in resolving itchy eczema rashes of the skin.  Overall potential side effects are very minimal and mild and they are much safer than oral steroids. The black box warning for the JAK inhibitor class was discussed, including rare cardiovascular effects, thrombosis, and serious bacterial and viral infections, such as TB or herpes zoster, although the severe CV side effects have not been seen with Rinvoq or Cibinqo in their safety trials.  Shingles vaccination may be recommended prior to starting medication.  Baseline lab tests are done prior to initiation of medication to ensure pt has no underlying lab abnormalities or infection, and then rechecked at 3 months.  If normal, then periodic doctor visits and annual labs are performed for long term medication management.   Recommend gentle skin care.  High risk medications (not anticoagulants) long-term use  Related Procedures Lipid Panel CBC with Differential/Platelet CMP Hep B Surface Antibody Hep B Surface Antigen Hep C Antibody QuantiFERON-TB Gold Plus  Tick Bite -  Exam: Pink scaly papule at the L lower calf. No surrounding erythema  Treatment Plan: Continue Clobetasol 0.05% cream to aa's QD-BID PRN.  Topical steroids (such as triamcinolone, fluocinolone, fluocinonide,  mometasone, clobetasol, halobetasol, betamethasone, hydrocortisone) can cause thinning and lightening of the skin if they are used for too long in the same area. Your physician has selected the right strength medicine for your problem and area affected on the body. Please use your medication only as directed by your physician to prevent side effects.   Return in about 3 weeks (around 06/11/2023) for atopic dermatitis/Rinvoq follow up.  Maylene Roes, CMA, am acting as scribe for Willeen Niece, MD .   Documentation: I have reviewed the above documentation for accuracy and completeness, and I agree with the above.  Willeen Niece, MD

## 2023-05-21 NOTE — Patient Instructions (Signed)
Due to recent changes in healthcare laws, you may see results of your pathology and/or laboratory studies on MyChart before the doctors have had a chance to review them. We understand that in some cases there may be results that are confusing or concerning to you. Please understand that not all results are received at the same time and often the doctors may need to interpret multiple results in order to provide you with the best plan of care or course of treatment. Therefore, we ask that you please give us 2 business days to thoroughly review all your results before contacting the office for clarification. Should we see a critical lab result, you will be contacted sooner.   If You Need Anything After Your Visit  If you have any questions or concerns for your doctor, please call our main line at [phone] and press option 4 to reach your doctor's medical assistant. If no one answers, please leave a voicemail as directed and we will return your call as soon as possible. Messages left after 4 pm will be answered the following business day.   You may also send us a message via MyChart. We typically respond to MyChart messages within 1-2 business days.  For prescription refills, please ask your pharmacy to contact our office. Our fax number is [phone].  If you have an urgent issue when the clinic is closed that cannot wait until the next business day, you can page your doctor at the number below.    Please note that while we do our best to be available for urgent issues outside of office hours, we are not available 24/7.   If you have an urgent issue and are unable to reach us, you may choose to seek medical care at your doctor's office, retail clinic, urgent care center, or emergency room.  If you have a medical emergency, please immediately call 911 or go to the emergency department.  Pager Numbers  - Dr. Kowalski: [phone]  - Dr. Moye: [phone]  - Dr. Stewart:  [phone]  In the event of inclement weather, please call our main line at [phone] for an update on the status of any delays or closures.  Dermatology Medication Tips: Please keep the boxes that topical medications come in in order to help keep track of the instructions about where and how to use these. Pharmacies typically print the medication instructions only on the boxes and not directly on the medication tubes.   If your medication is too expensive, please contact our office at [phone] option 4 or send us a message through MyChart.   We are unable to tell what your co-pay for medications will be in advance as this is different depending on your insurance coverage. However, we may be able to find a substitute medication at lower cost or fill out paperwork to get insurance to cover a needed medication.   If a prior authorization is required to get your medication covered by your insurance company, please allow us 1-2 business days to complete this process.  Drug prices often vary depending on where the prescription is filled and some pharmacies may offer cheaper prices.  The website www.goodrx.com contains coupons for medications through different pharmacies. The prices here do not account for what the cost may be with help from insurance (it may be cheaper with your insurance), but the website can give you the price if you did not use any insurance.  - You can print the associated coupon and take it with   your prescription to the pharmacy.  - You may also stop by our office during regular business hours and pick up a GoodRx coupon card.  - If you need your prescription sent electronically to a different pharmacy, notify our office through [facility] MyChart or by phone at [phone] option 4.     Si Usted Necesita Algo Despus de Su Visita  Tambin puede enviarnos un mensaje a travs de MyChart. Por lo general respondemos a los mensajes de MyChart en el transcurso de 1 a 2  das hbiles.  Para renovar recetas, por favor pida a su farmacia que se ponga en contacto con nuestra oficina. Nuestro nmero de fax es el [phone].  Si tiene un asunto urgente cuando la clnica est cerrada y que no puede esperar hasta el siguiente da hbil, puede llamar/localizar a su doctor(a) al nmero que aparece a continuacin.   Por favor, tenga en cuenta que aunque hacemos todo lo posible para estar disponibles para asuntos urgentes fuera del horario de oficina, no estamos disponibles las 24 horas del da, los 7 das de la semana.   Si tiene un problema urgente y no puede comunicarse con nosotros, puede optar por buscar atencin mdica  en el consultorio de su doctor(a), en una clnica privada, en un centro de atencin urgente o en una sala de emergencias.  Si tiene una emergencia mdica, por favor llame inmediatamente al 911 o vaya a la sala de emergencias.  Nmeros de bper  - Dr. Kowalski: [phone]  - Dra. Moye: [phone]  - Dra. Stewart: [phone]  En caso de inclemencias del tiempo, por favor llame a nuestra lnea principal al [phone] para una actualizacin sobre el estado de cualquier retraso o cierre.  Consejos para la medicacin en dermatologa: Por favor, guarde las cajas en las que vienen los medicamentos de uso tpico para ayudarle a seguir las instrucciones sobre dnde y cmo usarlos. Las farmacias generalmente imprimen las instrucciones del medicamento slo en las cajas y no directamente en los tubos del medicamento.   Si su medicamento es muy caro, por favor, pngase en contacto con nuestra oficina llamando al [phone] y presione la opcin 4 o envenos un mensaje a travs de MyChart.   No podemos decirle cul ser su copago por los medicamentos por adelantado ya que esto es diferente dependiendo de la cobertura de su seguro. Sin embargo, es posible que podamos encontrar un medicamento sustituto a menor costo o llenar un formulario para que el  seguro cubra el medicamento que se considera necesario.   Si se requiere una autorizacin previa para que su compaa de seguros cubra su medicamento, por favor permtanos de 1 a 2 das hbiles para completar este proceso.  Los precios de los medicamentos varan con frecuencia dependiendo del lugar de dnde se surte la receta y alguna farmacias pueden ofrecer precios ms baratos.  El sitio web www.goodrx.com tiene cupones para medicamentos de diferentes farmacias. Los precios aqu no tienen en cuenta lo que podra costar con la ayuda del seguro (puede ser ms barato con su seguro), pero el sitio web puede darle el precio si no utiliz ningn seguro.  - Puede imprimir el cupn correspondiente y llevarlo con su receta a la farmacia.  - Tambin puede pasar por nuestra oficina durante el horario de atencin regular y recoger una tarjeta de cupones de GoodRx.  - Si necesita que su receta se enve electrnicamente a una farmacia diferente, informe a nuestra oficina a travs de MyChart de [place]   o por telfono llamando al [phone] y presione la opcin 4.  

## 2023-05-22 DIAGNOSIS — Z79899 Other long term (current) drug therapy: Secondary | ICD-10-CM | POA: Diagnosis not present

## 2023-05-23 ENCOUNTER — Other Ambulatory Visit: Payer: Self-pay | Admitting: Internal Medicine

## 2023-05-23 DIAGNOSIS — R0609 Other forms of dyspnea: Secondary | ICD-10-CM

## 2023-05-27 ENCOUNTER — Telehealth: Payer: Self-pay

## 2023-05-27 LAB — COMPREHENSIVE METABOLIC PANEL
ALT: 94 IU/L — ABNORMAL HIGH (ref 0–44)
AST: 79 IU/L — ABNORMAL HIGH (ref 0–40)
Albumin: 5.3 g/dL — ABNORMAL HIGH (ref 4.1–5.1)
Alkaline Phosphatase: 69 IU/L (ref 44–121)
BUN/Creatinine Ratio: 8 — ABNORMAL LOW (ref 9–20)
BUN: 18 mg/dL (ref 6–24)
Bilirubin Total: 0.8 mg/dL (ref 0.0–1.2)
CO2: 20 mmol/L (ref 20–29)
Calcium: 10.9 mg/dL — ABNORMAL HIGH (ref 8.7–10.2)
Chloride: 102 mmol/L (ref 96–106)
Creatinine, Ser: 2.18 mg/dL — ABNORMAL HIGH (ref 0.76–1.27)
Globulin, Total: 2.3 g/dL (ref 1.5–4.5)
Glucose: 91 mg/dL (ref 70–99)
Potassium: 4.6 mmol/L (ref 3.5–5.2)
Sodium: 142 mmol/L (ref 134–144)
Total Protein: 7.6 g/dL (ref 6.0–8.5)
eGFR: 38 mL/min/{1.73_m2} — ABNORMAL LOW (ref >59)

## 2023-05-27 LAB — LIPID PANEL
Chol/HDL Ratio: 4 ratio (ref 0.0–5.0)
Cholesterol, Total: 128 mg/dL (ref 100–199)
HDL: 32 mg/dL — ABNORMAL LOW (ref >39)
LDL Chol Calc (NIH): 52 mg/dL (ref 0–99)
Triglycerides: 279 mg/dL — ABNORMAL HIGH (ref 0–149)
VLDL Cholesterol Cal: 44 mg/dL — ABNORMAL HIGH (ref 5–40)

## 2023-05-27 LAB — QUANTIFERON-TB GOLD PLUS
QuantiFERON Mitogen Value: >10 IU/mL
QuantiFERON Nil Value: 0.03 IU/mL
QuantiFERON TB1 Ag Value: 0.07 IU/mL
QuantiFERON TB2 Ag Value: 0.16 IU/mL
QuantiFERON-TB Gold Plus: NEGATIVE

## 2023-05-27 LAB — HEPATITIS C ANTIBODY: Hep C Virus Ab: NONREACTIVE

## 2023-05-27 LAB — HEPATITIS B SURFACE ANTIBODY,QUALITATIVE: Hep B Surface Ab, Qual: NONREACTIVE

## 2023-05-27 LAB — HEPATITIS B SURFACE ANTIGEN: Hepatitis B Surface Ag: NEGATIVE

## 2023-05-27 NOTE — Telephone Encounter (Signed)
Left patient message to call for lab results.  I did forward labs to pcp Dr. Dondra Prader Patel./sh

## 2023-05-27 NOTE — Telephone Encounter (Signed)
-----   Message from Willeen Niece sent at 05/27/2023 12:06 PM EDT ----- Baseline labs with some abnormalities that should be addressed by PCP if not already done so- please forward.  He has elevated Tgs, elevated Cr, and elevated Lfts. Hep panel is negative, TB is negative. Continue Rinvoq samples  - please call patient

## 2023-05-27 NOTE — Telephone Encounter (Signed)
Patient returned our call, discussed labs with some abnormalities that should be addressed by PCP, labs sent to PCP- patient instructed to follow up with PCP. Okay to continue Rinvoq

## 2023-06-03 ENCOUNTER — Ambulatory Visit: Payer: 59 | Admitting: Dermatology

## 2023-06-03 ENCOUNTER — Telehealth: Payer: Self-pay | Admitting: Pulmonary Disease

## 2023-06-03 NOTE — Telephone Encounter (Signed)
Called and spoke with patient.  Patient stated he has been having increased episodes of sob while working in the heat the last few weeks.  Patient stated he has started experiencing some chest discomfort while having sob episodes.  Patient stated rest usually helps and he is using his albuterol inhaler, but today's episode lasted longer.  Patient stated he was unsure if it was something serious or if it is worse due to anxiety during episodes.  Patient is scheduled 06/06/23 at 3:15pm with Dr. Francine Graven.  I did advise patient if he is having severe chest pains, crushing pains, or increased sob to seek ED or Urgent Care to be evaluated.

## 2023-06-03 NOTE — Telephone Encounter (Signed)
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 Can we call in something to assist?  Elijah Shaffer is Elijah Shaffer  His # is [phone]

## 2023-06-06 ENCOUNTER — Ambulatory Visit (INDEPENDENT_AMBULATORY_CARE_PROVIDER_SITE_OTHER): Payer: 59 | Admitting: Pulmonary Disease

## 2023-06-06 ENCOUNTER — Encounter: Payer: Self-pay | Admitting: Pulmonary Disease

## 2023-06-06 VITALS — BP 124/86 | HR 70 | Ht 74.0 in | Wt 265.8 lb

## 2023-06-06 DIAGNOSIS — G4733 Obstructive sleep apnea (adult) (pediatric): Secondary | ICD-10-CM

## 2023-06-06 DIAGNOSIS — R0609 Other forms of dyspnea: Secondary | ICD-10-CM | POA: Diagnosis not present

## 2023-06-06 NOTE — Progress Notes (Signed)
Synopsis: Referred in September 2022 for Cough and shortness of breath  Subjective:   PATIENT ID: Elijah Shaffer GENDER: male DOB: 05-Feb-1981, MRN: [id number]  HPI  Chief Complaint  Patient presents with   Follow-up    F/U for SOB and chest discomfort. States the discomfort is towards the middle of his chest, described as an achy pain. Tends to feel weak during these episodes. Still using cpap machine.    Elijah Shaffer is a 42 year old male, never smoker with history of GERD, large hiatal hernia, seasonal allergies and OSA who returns to pulmonary clinic for acute visit.  He returns for acute visit, increased dyspnea and chest discomfort with exertional work. This has progressed over recent weeks. He has stopped working and taking a break which has relieved him of this discomfort and shortness of breath. Denies nausea with these episodes. He uses albuterol which is hard to tell if it helps.   OV 01/01/23 He was feeling well for a few months, but recently he has noticed events of shortness of breath, tired feeling in his legs and light headed. He has noticed these symptoms with exertion. No cough or wheezing.  He has not been consistently using his CPAP machine over recent months. He is having trouble with mask leak and fit.   OV 02/15/22 He was started on CPAP therapy and his CPAP download shows 100% compliance with 0.7 AHI. He initially had a full face mask which was switched to a hybrid nasal pillow full face mask due to the skin on his nose getting sore.  He is following with Neurology. He had an abnormal EMG and has elevated protein on CSF evaluation. There are CSF tests pending at this time.   OV 01/02/22 He continues to have easy fatigability and dyspnea with exertion. Heart cath 2/1 shows normal coronary arteries and normal hemodynamics. ICS/LABA inhaler therapy stopped at last visit with no changes in his symptoms.  He is being evaluated by rheumatology for elevated CK level and  RNP ab level. He is being evaluated for numbness and tingling symptoms by neurology and is ordered for an EMG and cervical spine MRI.   He went to urgent care on 2/16 for shortness of breath and chest pains. Given patient's concerns for tick exposures, lyme and [place] spotted fever labs ordered. RMSF IgG is negative and IgM is elevated at 1.67. D-dimer 2/16 was not elevated. He saw his PCP on 12/31/21 and was prescribed doxycyline but has not taken this medication yet.   Home sleep study 12/12/21 showed moderate obstructive sleep apnea.   OV 10/31/21 HRCT chest does not indicate ILD involvement. There is a large hiatal hernia noted. Pulmonary function tests show mild diffusion defect with normal spirometry and normal TLC. It was difficult to get consistent data during the pulmonary function testing as the patient was getting fatigued.  Echo from 02/24/21 showed mildly dilated RV with normal function.   CK level is mildly elevated at 356 and ENA RNP ab is elevated. Remaining inflammatory workup is unremarkable which included myositis panel, ANA, CCP, dsDNA, RF, ESR, CRP and ddimer. Hypersensitivity pneumonitis panel is negative.  He continues to have exertional dyspnea and easily fatigued with exertion. He describes a pre-syncopal event when working recently. He continues to work as a Administrator.   He reports a small raised dry patch of skin on left lower extremity, lateral side and similar rash on right lateral ankle that is itchy. He also reports feeling pain  in his legs when laying down at night, even with out a hard days work.    OV 10/02/21 He called in after being seen on 11/18 complaining of similar complaints as discussed in the office visit that day. He complains of more fatigue than normal and shortness of breath with exertion. He has felt lightheaded with exertion before. He feels very run down. He denies any fevers or chills. His appetite remains good.  OV 09/28/21 He continues  to experience exertional dyspnea. He was trialed on as needed albuterol with some improvement but continues to experience significant dyspnea with exertion. For example, he was short of breath walking around [employer] this past week. He denies wheezing. He does have cough with greenish-yellowish sputum.   Nasal congestion is improved with fluticasone nasal spray. He is in leaf season now as a Administrator.   He was unable to have PFTs done since last visit as his mother was recently diagnosed with cervical cancer and they were occupied with her treatment schedule.   He denies leg swelling, chest pain or syncopal episodes.  He raised the question with his previous work exposures using round-up and other herbicide chemicals could lead to his breathing issues.  OV 07/24/21 He reports shortness of breath over the last 4 months.  The shortness of breath is evident with exertion and resolves with rest.  Patient has been treated for tickborne illness with concern for Ehrlichia and has been on doxycycline.  He is being evaluated by infectious disease today.  He has been much more inactive over these last 4 months.  He works as a Administrator and has not been working as much due to the shortness of breath.  He denies any wheezing or chest tightness.  He has intermittent dry cough that is occasionally productive with sputum.  He is being treated with PPI therapy for GERD and has a small hiatal hernia on CT imaging.  He does report seasonal allergies with nasal and sinus congestion more so in the spring and fall.  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  He denies any history of blood clots.  No family history of blood clots.  No family history of lung disease.  No history of smoking or vaping.  He does report some intermittent secondhand smoke exposure via social gatherings.  He did grow up in a home with a wood stove.  Past Medical History:  Diagnosis Date   Acute renal injury (HCC) 06/28/2017    Anxiety    Aspiration pneumonia of right upper lobe due to gastric secretions (HCC)    Diverticulitis    GERD (gastroesophageal reflux disease) 01/08/2018   Seasonal allergies    Syncope 06/26/2021     Family History  Problem Relation Age of Onset   Healthy Mother    Cervical cancer Mother    Cancer Father    Colon cancer Maternal Grandmother    Colon cancer Maternal Grandfather      Social History   Socioeconomic History   Marital status: Single    Spouse name: Not on file   Number of children: Not on file   Years of education: Not on file   Highest education level: Not on file  Occupational History   Not on file  Tobacco Use   Smoking status: Never    Passive exposure: Past   Smokeless tobacco: Never  Vaping Use   Vaping status: Never Used  Substance and Sexual Activity   Alcohol use: Yes  Comment: occ   Drug use: No   Sexual activity: Not on file  Other Topics Concern   Not on file  Social History Narrative   He currently has his own lawn care landscaping service. He previous had 80 yards before he got sick and since decreased. He works around a Interior and spatial designer. One of the chemical names is roundup.    Social Determinants of Health   Financial Resource Strain: Not on file  Food Insecurity: No Food Insecurity (12/27/2022)   Hunger Vital Sign    Worried About Running Out of Food in the Last Year: Never true    Ran Out of Food in the Last Year: Never true  Transportation Needs: No Transportation Needs (12/27/2022)   PRAPARE - Administrator, Civil Service (Medical): No    Lack of Transportation (Non-Medical): No  Physical Activity: Insufficiently Active (03/29/2022)   Exercise Vital Sign    Days of Exercise per Week: 2 days    Minutes of Exercise per Session: 10 min  Stress: Stress Concern Present (05/07/2022)   [employer] of Occupational Health - Occupational Stress Questionnaire    Feeling of Stress : To some extent  Social Connections:  Not on file  Intimate Partner Violence: Not At Risk (12/27/2022)   Humiliation, Afraid, Rape, and Kick questionnaire    Fear of Current or Ex-Partner: No    Emotionally Abused: No    Physically Abused: No    Sexually Abused: No     Allergies  Allergen Reactions   Gramineae Pollens Itching   Claritin [Loratadine] Other (See Comments)    Heart race   Loratadine-Pseudoephedrine Er Palpitations   Pollen Extract Other (See Comments)    Seasonal allergies     Outpatient Medications Prior to Visit  Medication Sig Dispense Refill   albuterol (VENTOLIN HFA) 108 (90 Base) MCG/ACT inhaler INHALE 2 PUFFS BY MOUTH EVERY 6 HOURS AS NEEDED FOR SHORTNESS OF BREATH 9 g 0   amLODipine (NORVASC) 5 MG tablet Take 1 tablet (5 mg total) by mouth daily. 90 tablet 1   aspirin EC 81 MG tablet Take 81 mg by mouth daily. Swallow whole.     b complex vitamins capsule Take 1 capsule by mouth daily. With D3 gummie     betamethasone dipropionate 0.05 % lotion Apply 1-2 times daily to affected itchy areas at scalp. Avoid applying to face, groin, and axilla. Use as directed. Long-term use can cause thinning of the skin. 60 mL 2   cetirizine (ZYRTEC) 10 MG tablet Take 10 mg by mouth daily.     Cholecalciferol (VITAMIN D-3 PO) Take by mouth.     clindamycin (CLEOCIN T) 1 % external solution Apply topically to acne bumps at aa's qd 30 mL 3   clobetasol (TEMOVATE) 0.05 % external solution Apply 1 application. topically 2 (two) times daily. Use as directed.  Mix with cerave cream. Use for up to 4 weeks. Avoid applying to face, groin, and axilla. Use as directed. 50 mL 1   clobetasol cream (TEMOVATE) 0.05 % Apply small amount daily before bed to right ankle or other areas of body as needed for itchy rash Avoid applying to face, groin, and axilla Use as directed. 30 g 0   cyclobenzaprine (FLEXERIL) 5 MG tablet Take 1 tablet (5 mg total) by mouth at bedtime as needed for muscle spasms. 30 tablet 1   diphenhydrAMINE-zinc  acetate (BENADRYL EXTRA STRENGTH) cream Apply 1 application topically 3 (three) times daily as  needed for itching. 28.4 g 0   docusate sodium (COLACE) 100 MG capsule Take 100 mg by mouth 2 (two) times daily.     DULoxetine (CYMBALTA) 30 MG capsule Take 60 mg by mouth daily.     dupilumab (DUPIXENT) 300 MG/2ML prefilled syringe Inject 300 mg into the skin every 14 (fourteen) days. Starting at day 15 for maintenance. 4 mL 5   Dupilumab (DUPIXENT) 300 MG/2ML SOPN Inject 300 mg into the skin every 14 (fourteen) days. Starting at day 15 for maintenance. 4 mL 5   fenofibrate (TRICOR) 145 MG tablet Take 1 tablet (145 mg total) by mouth daily. 90 tablet 3   Fluocinolone Acetonide 0.01 % OIL Use 1 - 2 drop qd/bid prn for scale at ears 20 mL 2   fluocinonide (LIDEX) 0.05 % external solution Apply once or twice daily to affected areas on scalp up to 2 weeks as needed for itching. Avoid applying to face, groin, and axilla. 60 mL 2   fluticasone (FLONASE ALLERGY RELIEF) 50 MCG/ACT nasal spray Place 2 sprays into both nostrils daily as needed for allergies or rhinitis. (Patient taking differently: Place 2 sprays into both nostrils daily.) 9.9 mL 2   fluticasone-salmeterol (ADVAIR) 250-50 MCG/ACT AEPB as needed.     gabapentin (NEURONTIN) 300 MG capsule Take 300 mg by mouth 3 (three) times daily.     guaiFENesin (MUCINEX) 600 MG 12 hr tablet Take by mouth.     hydrOXYzine (VISTARIL) 25 MG capsule Take 1 capsule (25 mg total) by mouth every 8 (eight) hours as needed. Take before bed as needed for itchy. Can cause drowsiness. 60 capsule 2   ketoconazole (NIZORAL) 2 % shampoo Apply 1 Application topically daily as needed for irritation. 120 mL 11   lubiprostone (AMITIZA) 8 MCG capsule Take 1 capsule (8 mcg total) by mouth 2 (two) times daily with a meal. 180 capsule 3   montelukast (SINGULAIR) 10 MG tablet Take 1 tablet (10 mg total) by mouth at bedtime. 30 tablet 5   naproxen (NAPROSYN) 500 MG tablet Take 500 mg by  mouth 2 (two) times daily.     NEEDLE, DISP, 18 G 18G X 1-1/2" MISC Use to draw up testosterone for injection 2 each 12   omega-3 acid ethyl esters (LOVAZA) 1 g capsule Take 2 capsules (2 g total) by mouth 2 (two) times daily. 120 capsule 3   omeprazole (PRILOSEC) 40 MG capsule Take 1 capsule by mouth twice daily 180 capsule 1   ondansetron (ZOFRAN) 4 MG tablet Take 1 tablet (4 mg total) by mouth every 6 (six) hours as needed for nausea. 20 tablet 0   potassium gluconate (RA POTASSIUM GLUCONATE) 595 (99 K) MG TABS tablet Take 1 tablet by mouth daily.     propranolol (INDERAL) 20 MG tablet Take one tablet 20 mg  twice daily  may take an  additional dose of 20 mg  as needed for palpitations 190 tablet 3   rosuvastatin (CRESTOR) 40 MG tablet Take 1 tablet (40 mg total) by mouth daily. 90 tablet 3   senna (SENOKOT) 8.6 MG tablet Take 1 tablet by mouth daily. prn     SYRINGE-NEEDLE, DISP, 3 ML 22G X 1-1/2" 3 ML MISC Use to inject testosterone 2 each 12   tacrolimus (PROTOPIC) 0.1 % ointment APPLY TOPICALLY TO AFFECTED AREAS OF BODY FOR RASH DAILY OR TWICE DAILY AS NEEDED 30 g 0   testosterone cypionate (DEPOTESTOSTERONE CYPIONATE) 200 MG/ML injection Inject 0.5 mLs (100 mg  total) into the muscle every 7 (seven) days. 10 mL 1   triamcinolone cream (KENALOG) 0.1 %      Facility-Administered Medications Prior to Visit  Medication Dose Route Frequency Provider Last Rate Last Admin   testosterone cypionate (DEPOTESTOSTERONE CYPIONATE) injection 100 mg  100 mg Intramuscular Weekly Bjorn Pippin, MD   100 mg at 04/24/23 1125   Review of Systems  Constitutional:  Negative for chills, fever, malaise/fatigue and weight loss.  HENT:  Negative for congestion, sinus pain and sore throat.   Eyes: Negative.   Respiratory:  Positive for shortness of breath. Negative for cough, hemoptysis, sputum production and wheezing.   Cardiovascular:  Positive for chest pain. Negative for palpitations, orthopnea, claudication  and leg swelling.  Gastrointestinal:  Negative for abdominal pain, heartburn, nausea and vomiting.  Genitourinary: Negative.   Musculoskeletal:  Negative for joint pain and myalgias.       Leg pain  Skin:  Negative for rash.  Neurological:  Negative for weakness.  Endo/Heme/Allergies: Negative.     Objective:   Vitals:   06/06/23 1520  BP: 124/86  Pulse: 70  SpO2: 96%  Weight: 265 lb 12.8 oz (120.6 kg)  Height: 6\' 2"  (1.88 m)    Physical Exam Constitutional:      General: He is not in acute distress.    Appearance: He is obese. He is not ill-appearing or toxic-appearing.  HENT:     Head: Normocephalic and atraumatic.  Eyes:     Conjunctiva/sclera: Conjunctivae normal.  Cardiovascular:     Rate and Rhythm: Normal rate and regular rhythm.     Pulses: Normal pulses.     Heart sounds: Normal heart sounds. No murmur heard. Pulmonary:     Effort: Pulmonary effort is normal.     Breath sounds: No decreased air movement. No wheezing, rhonchi or rales.  Musculoskeletal:     Right lower leg: No edema.     Left lower leg: No edema.  Skin:    General: Skin is warm and dry.  Neurological:     Mental Status: He is alert.    CBC    Component Value Date/Time   WBC 11.8 (H) 01/07/2023 1556   WBC 10.4 12/27/2022 1042   RBC 5.36 01/07/2023 1556   RBC 5.41 12/27/2022 1042   HGB 16.1 05/08/2023 1315   HCT 48.7 05/08/2023 1315   PLT 291 01/07/2023 1556   MCV 89 01/07/2023 1556   MCH 30.0 01/07/2023 1556   MCH 30.1 12/27/2022 1042   MCHC 33.8 01/07/2023 1556   MCHC 34.2 12/27/2022 1042   RDW 13.1 01/07/2023 1556   LYMPHSABS 3.3 12/27/2022 1042   LYMPHSABS 2.7 12/02/2022 0859   MONOABS 0.9 12/27/2022 1042   EOSABS 0.1 12/27/2022 1042   EOSABS 0.1 12/02/2022 0859   BASOSABS 0.1 12/27/2022 1042   BASOSABS 0.1 12/02/2022 0859      Latest Ref Rng & Units 05/22/2023   12:05 PM 03/24/2023    9:24 AM 01/07/2023    3:56 PM  BMP  Glucose 70 - 99 mg/dL 91  409  88   BUN 6 - 24  mg/dL 18  15  16    Creatinine 0.76 - 1.27 mg/dL 8.11  9.14  7.82   BUN/Creat Ratio 9 - 20 8  10  11    Sodium 134 - 144 mmol/L 142  142  137   Potassium 3.5 - 5.2 mmol/L 4.6  4.8  4.3   Chloride 96 - 106 mmol/L 102  103  104   CO2 20 - 29 mmol/L 20  23  18    Calcium 8.7 - 10.2 mg/dL 21.3  08.6  9.9    Chest imaging: HRCT Chest 10/17/21 1. No findings to suggest interstitial lung disease. No acute findings in the thorax to account for the patient's symptoms. 2. Small pulmonary nodules measuring up to 5 mm in the lateral segment of the right middle lobe, nonspecific, but statistically likely benign. No follow-up needed if patient is low-risk (and has no known or suspected primary neoplasm). Non-contrast chest CT can be considered in 12 months if patient is high-risk.  3. Large hiatal hernia. 4. Mild hepatic steatosis. 5. Colonic diverticulosis  CXR 10/03/21 Increased interstitial prominence bilaterally with medial right upper lobe opacity along the mediastinal border.   CXR 07/19/21 Mild scarring of right lateral base. Small to moderate hiatal hernia.  Increased retrosternal airspace  PFT:    Latest Ref Rng & Units 10/30/2021    9:06 AM  PFT Results  FVC-Pre L 5.65   FVC-Predicted Pre % 91   FVC-Post L 5.94   FVC-Predicted Post % 96   Pre FEV1/FVC % % 88   Post FEV1/FCV % % 82   FEV1-Pre L 4.97   FEV1-Predicted Pre % 101   FEV1-Post L 4.88   DLCO uncorrected ml/min/mmHg 24.00   DLCO UNC% % 66   DLCO corrected ml/min/mmHg 23.19   DLCO COR %Predicted % 64   DLVA Predicted % 88   TLC L 7.27   TLC % Predicted % 91   RV % Predicted % 55   10/30/21: Mild diffusion defect with normal spirometry and TLC.  Echo 02/24/21: LV EF 60-65%. Normal diastolic parameters. RV systolic function is normal. RV is mildly enlarged.   RHC 12/12/21: RA = 4 RV = 23/4 PA = 22/6 (14) PCW = 7 Fick cardiac output/index = 7.1/3.0 PVR = 1.0 WU Ao sat = 95% PA sat = 76%, 76% SVC sat =  74%  Assessment & Plan:   No diagnosis found.  Discussion: Elijah Shaffer is a 42 year old male, never smoker with history of GERD, large hiatal hernia, seasonal allergies and OSA who returns to pulmonary clinic for acute visit.  His dyspnea and chest discomfort are likely to due the extreme heat and stress on his body. He also has elevated serum Cr on 7/11 which may indicate poor hydration status. His hiatal hernia could be contributing.  Encouraged him to improve the length of time he wears his cpap each night, download showed only 6 days with greater than 4 hour use.   Follow up in 6 months for repeat PFTs.  Melody Comas, MD [facility] Office: [phone]   Current Outpatient Medications:    albuterol (VENTOLIN HFA) 108 (90 Base) MCG/ACT inhaler, INHALE 2 PUFFS BY MOUTH EVERY 6 HOURS AS NEEDED FOR SHORTNESS OF BREATH, Disp: 9 g, Rfl: 0   amLODipine (NORVASC) 5 MG tablet, Take 1 tablet (5 mg total) by mouth daily., Disp: 90 tablet, Rfl: 1   aspirin EC 81 MG tablet, Take 81 mg by mouth daily. Swallow whole., Disp: , Rfl:    b complex vitamins capsule, Take 1 capsule by mouth daily. With D3 gummie, Disp: , Rfl:    betamethasone dipropionate 0.05 % lotion, Apply 1-2 times daily to affected itchy areas at scalp. Avoid applying to face, groin, and axilla. Use as directed. Long-term use can cause thinning of the skin., Disp: 60 mL,  Rfl: 2   cetirizine (ZYRTEC) 10 MG tablet, Take 10 mg by mouth daily., Disp: , Rfl:    Cholecalciferol (VITAMIN D-3 PO), Take by mouth., Disp: , Rfl:    clindamycin (CLEOCIN T) 1 % external solution, Apply topically to acne bumps at aa's qd, Disp: 30 mL, Rfl: 3   clobetasol (TEMOVATE) 0.05 % external solution, Apply 1 application. topically 2 (two) times daily. Use as directed.  Mix with cerave cream. Use for up to 4 weeks. Avoid applying to face, groin, and axilla. Use as directed., Disp: 50 mL, Rfl: 1   clobetasol cream (TEMOVATE) 0.05  %, Apply small amount daily before bed to right ankle or other areas of body as needed for itchy rash Avoid applying to face, groin, and axilla Use as directed., Disp: 30 g, Rfl: 0   cyclobenzaprine (FLEXERIL) 5 MG tablet, Take 1 tablet (5 mg total) by mouth at bedtime as needed for muscle spasms., Disp: 30 tablet, Rfl: 1   diphenhydrAMINE-zinc acetate (BENADRYL EXTRA STRENGTH) cream, Apply 1 application topically 3 (three) times daily as needed for itching., Disp: 28.4 g, Rfl: 0   docusate sodium (COLACE) 100 MG capsule, Take 100 mg by mouth 2 (two) times daily., Disp: , Rfl:    DULoxetine (CYMBALTA) 30 MG capsule, Take 60 mg by mouth daily., Disp: , Rfl:    dupilumab (DUPIXENT) 300 MG/2ML prefilled syringe, Inject 300 mg into the skin every 14 (fourteen) days. Starting at day 15 for maintenance., Disp: 4 mL, Rfl: 5   Dupilumab (DUPIXENT) 300 MG/2ML SOPN, Inject 300 mg into the skin every 14 (fourteen) days. Starting at day 15 for maintenance., Disp: 4 mL, Rfl: 5   fenofibrate (TRICOR) 145 MG tablet, Take 1 tablet (145 mg total) by mouth daily., Disp: 90 tablet, Rfl: 3   Fluocinolone Acetonide 0.01 % OIL, Use 1 - 2 drop qd/bid prn for scale at ears, Disp: 20 mL, Rfl: 2   fluocinonide (LIDEX) 0.05 % external solution, Apply once or twice daily to affected areas on scalp up to 2 weeks as needed for itching. Avoid applying to face, groin, and axilla., Disp: 60 mL, Rfl: 2   fluticasone (FLONASE ALLERGY RELIEF) 50 MCG/ACT nasal spray, Place 2 sprays into both nostrils daily as needed for allergies or rhinitis. (Patient taking differently: Place 2 sprays into both nostrils daily.), Disp: 9.9 mL, Rfl: 2   fluticasone-salmeterol (ADVAIR) 250-50 MCG/ACT AEPB, as needed., Disp: , Rfl:    gabapentin (NEURONTIN) 300 MG capsule, Take 300 mg by mouth 3 (three) times daily., Disp: , Rfl:    guaiFENesin (MUCINEX) 600 MG 12 hr tablet, Take by mouth., Disp: , Rfl:    hydrOXYzine (VISTARIL) 25 MG capsule, Take 1 capsule  (25 mg total) by mouth every 8 (eight) hours as needed. Take before bed as needed for itchy. Can cause drowsiness., Disp: 60 capsule, Rfl: 2   ketoconazole (NIZORAL) 2 % shampoo, Apply 1 Application topically daily as needed for irritation., Disp: 120 mL, Rfl: 11   lubiprostone (AMITIZA) 8 MCG capsule, Take 1 capsule (8 mcg total) by mouth 2 (two) times daily with a meal., Disp: 180 capsule, Rfl: 3   montelukast (SINGULAIR) 10 MG tablet, Take 1 tablet (10 mg total) by mouth at bedtime., Disp: 30 tablet, Rfl: 5   naproxen (NAPROSYN) 500 MG tablet, Take 500 mg by mouth 2 (two) times daily., Disp: , Rfl:    NEEDLE, DISP, 18 G 18G X 1-1/2" MISC, Use to draw up testosterone for  injection, Disp: 2 each, Rfl: 12   omega-3 acid ethyl esters (LOVAZA) 1 g capsule, Take 2 capsules (2 g total) by mouth 2 (two) times daily., Disp: 120 capsule, Rfl: 3   omeprazole (PRILOSEC) 40 MG capsule, Take 1 capsule by mouth twice daily, Disp: 180 capsule, Rfl: 1   ondansetron (ZOFRAN) 4 MG tablet, Take 1 tablet (4 mg total) by mouth every 6 (six) hours as needed for nausea., Disp: 20 tablet, Rfl: 0   potassium gluconate (RA POTASSIUM GLUCONATE) 595 (99 K) MG TABS tablet, Take 1 tablet by mouth daily., Disp: , Rfl:    propranolol (INDERAL) 20 MG tablet, Take one tablet 20 mg  twice daily  may take an  additional dose of 20 mg  as needed for palpitations, Disp: 190 tablet, Rfl: 3   rosuvastatin (CRESTOR) 40 MG tablet, Take 1 tablet (40 mg total) by mouth daily., Disp: 90 tablet, Rfl: 3   senna (SENOKOT) 8.6 MG tablet, Take 1 tablet by mouth daily. prn, Disp: , Rfl:    SYRINGE-NEEDLE, DISP, 3 ML 22G X 1-1/2" 3 ML MISC, Use to inject testosterone, Disp: 2 each, Rfl: 12   tacrolimus (PROTOPIC) 0.1 % ointment, APPLY TOPICALLY TO AFFECTED AREAS OF BODY FOR RASH DAILY OR TWICE DAILY AS NEEDED, Disp: 30 g, Rfl: 0   testosterone cypionate (DEPOTESTOSTERONE CYPIONATE) 200 MG/ML injection, Inject 0.5 mLs (100 mg total) into the muscle  every 7 (seven) days., Disp: 10 mL, Rfl: 1   triamcinolone cream (KENALOG) 0.1 %, , Disp: , Rfl:   Current Facility-Administered Medications:    testosterone cypionate (DEPOTESTOSTERONE CYPIONATE) injection 100 mg, 100 mg, Intramuscular, Weekly, Bjorn Pippin, MD, 100 mg at 04/24/23 1125

## 2023-06-06 NOTE — Patient Instructions (Signed)
Continue to use CPAP nightly for 4 hours or more  Continue to use albuterol inhaler 1-2 puffs every 4-6 hours  Recommend drinking increased amounts of water and incorporating more breaks when the weather hot  Follow up in 6 months with pulmonary function tests

## 2023-06-12 ENCOUNTER — Ambulatory Visit (INDEPENDENT_AMBULATORY_CARE_PROVIDER_SITE_OTHER): Payer: 59 | Admitting: Gastroenterology

## 2023-06-12 ENCOUNTER — Ambulatory Visit (INDEPENDENT_AMBULATORY_CARE_PROVIDER_SITE_OTHER): Payer: 59 | Admitting: Dermatology

## 2023-06-12 DIAGNOSIS — W57XXXD Bitten or stung by nonvenomous insect and other nonvenomous arthropods, subsequent encounter: Secondary | ICD-10-CM | POA: Diagnosis not present

## 2023-06-12 DIAGNOSIS — L918 Other hypertrophic disorders of the skin: Secondary | ICD-10-CM | POA: Diagnosis not present

## 2023-06-12 DIAGNOSIS — S80862D Insect bite (nonvenomous), left lower leg, subsequent encounter: Secondary | ICD-10-CM | POA: Diagnosis not present

## 2023-06-12 DIAGNOSIS — Z79899 Other long term (current) drug therapy: Secondary | ICD-10-CM | POA: Diagnosis not present

## 2023-06-12 DIAGNOSIS — L2081 Atopic neurodermatitis: Secondary | ICD-10-CM | POA: Diagnosis not present

## 2023-06-12 MED ORDER — RINVOQ 15 MG PO TB24: 1.0000 | ORAL_TABLET | Freq: Every day | ORAL | 5 refills | Status: DC

## 2023-06-12 NOTE — Progress Notes (Signed)
Follow-Up Visit   Subjective  Elijah Shaffer is a 42 y.o. male who presents for the following: Atopic Neurodermatitis of the trunk, extremities, and scalp. He is improving with Rinvoq samples, hydroxyzine prn, and clobetasol cream prn. Itching has improved. It is better than when he was on Dupixent   The following portions of the chart were reviewed this encounter and updated as appropriate: medications, allergies, medical history  Review of Systems:  No other skin or systemic complaints except as noted in HPI or Assessment and Plan.  Objective  Well appearing patient in no apparent distress; mood and affect are within normal limits.  Areas Examined:   Relevant physical exam findings are noted in the Assessment and Plan.    Assessment & Plan      ATOPIC NEURODERMATITIS  Exam: Clear today <1% BSA  Chronic condition with duration or expected duration over one year. Currently well-controlled.   Atopic dermatitis - Severe, on Jak inhibitor (biologic medication).  Atopic dermatitis (eczema) is a chronic, relapsing, pruritic condition that can significantly affect quality of life. It is often associated with allergic rhinitis and/or asthma and can require treatment with topical medications, phototherapy, or in severe cases biologic medications, which require long term medication management.    Treatment Plan: Continue Rinvoq 15 mg po QD. Samples given x 1.  Lot No. [id number] Exp 01/17/2025. Patient will call for additional samples if Rx not received yet.  Will repeat labs at f/up appt  Continue Hydroxyzine 25 mg po BID prn itch.    Continue Clobetasol 0.05% cream to aa's QD-BID PRN.  Topical steroids (such as triamcinolone, fluocinolone, fluocinonide, mometasone, clobetasol, halobetasol, betamethasone, hydrocortisone) can cause thinning and lightening of the skin if they are used for too long in the same area. Your physician has selected the right strength medicine for your  problem and area affected on the body. Please use your medication only as directed by your physician to prevent side effects.    Discussed potential risks/benefits of JAK inhibitors (Rinvoq/Cibinqo) which are new oral biologic medications used for treating severe, refractory atopic dermatitis.  They are very effective in resolving itchy eczema rashes of the skin.  Overall potential side effects are very minimal and mild and they are much safer than oral steroids. The black box warning for the JAK inhibitor class was discussed, including rare cardiovascular effects, thrombosis, and serious bacterial and viral infections, such as TB or herpes zoster, although the severe CV side effects have not been seen with Rinvoq or Cibinqo in their safety trials.  Shingles vaccination may be recommended prior to starting medication.  Baseline lab tests are done prior to initiation of medication to ensure pt has no underlying lab abnormalities or infection, and then rechecked at 3 months.  If normal, then periodic doctor visits and annual labs are performed for long term medication management.   Recommend gentle skin care.   Tick Bite  Exam: Light pink scaly papule on the left lateral lower leg, pt states still itching  Treatment Plan: Start Eucrisa Ointment (apply 2nd) to aa left leg twice daily until improved. Samples given.   Continue Clobetasol 0.05% cream to aa's QD-BID PRN.  Topical steroids (such as triamcinolone, fluocinolone, fluocinonide, mometasone, clobetasol, halobetasol, betamethasone, hydrocortisone) can cause thinning and lightening of the skin if they are used for too long in the same area. Your physician has selected the right strength medicine for your problem and area affected on the body. Please use your medication only as  directed by your physician to prevent side effects.   Avoid scratching/rubbing area.  Acrochordons (Skin Tags) - Fleshy, skin-colored pedunculated papules, left flank -  Benign appearing.  - Observe. - If desired, they can be removed with an in office procedure that is not covered by insurance. - Please call the clinic if you notice any new or changing lesions.  Return 2-3 months, for Atopic Dermatitis.  ICherlyn Labella, CMA, am acting as scribe for Willeen Niece, MD .   Documentation: I have reviewed the above documentation for accuracy and completeness, and I agree with the above.  Willeen Niece, MD

## 2023-06-12 NOTE — Patient Instructions (Addendum)
Tick bite - left lower leg Clobetasol cream - Apply to affected area once to twice daily until improved. Eucrisa ointment - Apply to affected area leg once to twice daily until improved. (Apply 2nd)  Due to recent changes in healthcare laws, you may see results of your pathology and/or laboratory studies on MyChart before the doctors have had a chance to review them. We understand that in some cases there may be results that are confusing or concerning to you. Please understand that not all results are received at the same time and often the doctors may need to interpret multiple results in order to provide you with the best plan of care or course of treatment. Therefore, we ask that you please give [place] 2 business days to thoroughly review all your results before contacting the office for clarification. Should we see a critical lab result, you will be contacted sooner.   If You Need Anything After Your Visit  If you have any questions or concerns for your doctor, please call our main line at [phone] and press option 4 to reach your doctor's medical assistant. If no one answers, please leave a voicemail as directed and we will return your call as soon as possible. Messages left after 4 pm will be answered the following business day.   You may also send [place] a message via MyChart. We typically respond to MyChart messages within 1-2 business days.  For prescription refills, please ask your pharmacy to contact our office. Our fax number is [phone].  If you have an urgent issue when the clinic is closed that cannot wait until the next business day, you can page your doctor at the number below.    Please note that while we do our best to be available for urgent issues outside of office hours, we are not available 24/7.   If you have an urgent issue and are unable to reach [place], you may choose to seek medical care at your doctor's office, retail clinic, urgent care center, or emergency room.  If  you have a medical emergency, please immediately call 911 or go to the emergency department.  Pager Numbers  - Dr. Gwen Pounds: [phone]  - Dr. Roseanne Reno: [phone]  In the event of inclement weather, please call our main line at [phone] for an update on the status of any delays or closures.  Dermatology Medication Tips: Please keep the boxes that topical medications come in in order to help keep track of the instructions about where and how to use these. Pharmacies typically print the medication instructions only on the boxes and not directly on the medication tubes.   If your medication is too expensive, please contact our office at [phone] option 4 or send [place] a message through MyChart.   We are unable to tell what your co-pay for medications will be in advance as this is different depending on your insurance coverage. However, we may be able to find a substitute medication at lower cost or fill out paperwork to get insurance to cover a needed medication.   If a prior authorization is required to get your medication covered by your insurance company, please allow [place] 1-2 business days to complete this process.  Drug prices often vary depending on where the prescription is filled and some pharmacies may offer cheaper prices.  The website www.goodrx.com contains coupons for medications through different pharmacies. The prices here do not account for what the cost may be with help from insurance (it may be  cheaper with your insurance), but the website can give you the price if you did not use any insurance.  - You can print the associated coupon and take it with your prescription to the pharmacy.  - You may also stop by our office during regular business hours and pick up a GoodRx coupon card.  - If you need your prescription sent electronically to a different pharmacy, notify our office through [facility] or by phone at [phone] option 4.     Si Usted Necesita  Algo Despus de Su Visita  Tambin puede enviarnos un mensaje a travs de Clinical cytogeneticist. Por lo general respondemos a los mensajes de MyChart en el transcurso de 1 a 2 das hbiles.  Para renovar recetas, por favor pida a su farmacia que se ponga en contacto con nuestra oficina. Annie Sable de fax es [place] [phone].  Si tiene un asunto urgente cuando la clnica est cerrada y que no puede esperar hasta el siguiente da hbil, puede llamar/localizar a su doctor(a) al nmero que aparece a continuacin.   Por favor, tenga en cuenta que aunque hacemos todo lo posible para estar disponibles para asuntos urgentes fuera del horario de [place], no estamos disponibles las 24 horas del da, los 7 [address]  [address] de la [place].   Si tiene un problema urgente y no puede comunicarse con nosotros, puede optar por buscar atencin mdica  en el consultorio de su doctor(a), en una clnica privada, en un centro de atencin urgente o en una sala de emergencias.  Si tiene Engineer, drilling, por favor llame inmediatamente al 911 o vaya a la sala de emergencias.  Nmeros de bper  - Dr. Gwen Pounds: [phone]  - Dra. Roseanne Reno: [phone]  En caso de inclemencias del [place], por favor llame a Lacy Duverney principal al [phone] para una actualizacin sobre el [place] de cualquier retraso o cierre.  Consejos para la medicacin en dermatologa: Por favor, guarde las cajas en las que vienen los medicamentos de uso tpico para ayudarle a seguir las instrucciones sobre dnde y cmo usarlos. Las farmacias generalmente imprimen las instrucciones del medicamento slo en las cajas y no directamente en los tubos del [place].   Si su medicamento es muy caro, por favor, pngase en contacto con Rolm Gala llamando al [phone] y presione la opcin 4 o envenos un mensaje a travs de Clinical cytogeneticist.   No podemos decirle cul ser su copago por los medicamentos por adelantado ya que esto es diferente dependiendo de la cobertura  de su seguro. Sin embargo, es posible que podamos encontrar un medicamento sustituto a Audiological scientist un formulario para que el seguro cubra el medicamento que se considera necesario.   Si se requiere una autorizacin previa para que su compaa de seguros [place] su medicamento, por favor permtanos de 1 a 2 das hbiles para completar [address]  Los precios de los medicamentos varan con frecuencia dependiendo del Environmental consultant de dnde se surte la receta y alguna farmacias pueden ofrecer precios ms baratos.  El sitio web www.goodrx.com tiene cupones para medicamentos de Health and safety inspector. Los precios aqu no tienen en cuenta lo que podra costar con la ayuda del seguro (puede ser ms barato con su seguro), pero el sitio web puede darle el precio si no utiliz Tourist information centre manager.  - Puede imprimir el cupn correspondiente y llevarlo con su receta a la farmacia.  - Tambin puede pasar por nuestra oficina durante el horario de atencin regular y Education officer, museum una tarjeta de cupones de  GoodRx.  - Si necesita que su receta se enve electrnicamente a una farmacia diferente, informe a nuestra oficina a travs de MyChart de [place] o por telfono llamando al [phone] y presione la opcin 4.

## 2023-06-13 DIAGNOSIS — M5416 Radiculopathy, lumbar region: Secondary | ICD-10-CM | POA: Diagnosis not present

## 2023-06-13 DIAGNOSIS — G629 Polyneuropathy, unspecified: Secondary | ICD-10-CM | POA: Diagnosis not present

## 2023-06-18 ENCOUNTER — Telehealth: Payer: Self-pay | Admitting: *Deleted

## 2023-06-18 NOTE — Progress Notes (Signed)
  Care Coordination   Note   06/18/2023 Name: Elijah Shaffer MRN: [id number] DOB: Nov 27, 1980  Elijah Shaffer is a 42 y.o. year old male who sees Anabel Halon, MD for primary care. I reached out to Beverly Gust by phone today to offer care coordination services.  [name] was given information about Care Coordination services today including:   The Care Coordination services include support from the care team which includes your Nurse Coordinator, Clinical Social Worker, or Pharmacist.  The Care Coordination team is here to help remove barriers to the health concerns and goals most important to you. Care Coordination services are voluntary, and the patient may decline or stop services at any time by request to their care team member.   Care Coordination Consent Status: Patient agreed to services and verbal consent obtained.   Follow up plan:  Telephone appointment with care coordination team member scheduled for:  06/20/23  Encounter Outcome:  Pt. Scheduled  [facility] Coordination Care Guide  Direct Dial: [phone]

## 2023-06-18 NOTE — Progress Notes (Signed)
  Care Coordination  Outreach Note  06/18/2023 Name: Elijah Shaffer MRN: [id number] DOB: 1981/08/12   Care Coordination Outreach Attempts: An unsuccessful telephone outreach was attempted today to offer the patient information about available care coordination services.  Follow Up Plan:  Additional outreach attempts will be made to offer the patient care coordination information and services.   Encounter Outcome:  No Answer   Gwenevere Ghazi  Care Coordination Care Guide  Direct Dial: [phone]

## 2023-06-20 ENCOUNTER — Ambulatory Visit: Payer: Self-pay | Admitting: *Deleted

## 2023-06-20 ENCOUNTER — Other Ambulatory Visit: Payer: Self-pay | Admitting: Internal Medicine

## 2023-06-20 DIAGNOSIS — F419 Anxiety disorder, unspecified: Secondary | ICD-10-CM

## 2023-06-20 NOTE — Patient Outreach (Signed)
  Care Coordination   Initial Visit Note   06/20/2023 Name: Elijah Shaffer MRN: [id number] DOB: 1981/01/12  Elijah Shaffer is a 42 y.o. year old male who sees Anabel Halon, MD for primary care. I spoke with  Beverly Gust by phone today.  What matters to the patients health and wellness today?  Night sweat still sometimes but not as bad 1-2 night  ?kidney disease      Noted incre breathing wheezing, inhaler not working as well,  Stomach tight gas bloating diverticulosis seeds on mcd buns  Hx diverticulosis when eat buns with seeds pockets then bloating  Need dental services loss all bottom plate or implants   Eyes need glasses,  near sight out of pocket cost is an issue  See neurology in [place] Dr Marney Doctor- takes gabapentin Neurology monitors him for tremors    Goals Addressed   None    Patient Active Problem List   Diagnosis Date Noted   Night sweats 05/14/2023   Neuropathy 05/14/2023   Hypogonadism, male 03/24/2023   Essential hypertension 03/24/2023   Prediabetes 03/24/2023   Erythrocytosis 12/03/2022   Encounter for general adult medical examination with abnormal findings 12/02/2022   OSA (obstructive sleep apnea) 12/02/2022   Urinary retention 11/05/2022   Cervical spinal stenosis 07/29/2022   DDD (degenerative disc disease), lumbar 07/29/2022   NASH (nonalcoholic steatohepatitis) 06/10/2022   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/2022   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   PSVT (paroxysmal supraventricular tachycardia) 03/25/2022   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   Encounter for examination following treatment at hospital 12/31/2021   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   Chronic sinusitis 12/04/2021   Mixed hyperlipidemia 11/30/2021   Esophageal hiatal hernia 11/22/2021   Irritable bowel syndrome with constipation 11/22/2021   SOB (shortness of breath) on exertion 06/28/2021   Anxiety 06/26/2021   Eczema  06/26/2021   Barrett's esophagus 04/11/2021   History of colonic polyps 04/11/2021   Focal neurological deficit 02/23/2021   GERD (gastroesophageal reflux disease) 01/08/2018    SDOH assessments and interventions completed:  Yes{THN Tip this will not be part of the note when signed-REQUIRED REPORT FIELD DO NOT DELETE (Optional):27901}     Care Coordination Interventions:  Yes, provided {THN Tip this will not be part of the note when signed-REQUIRED REPORT FIELD DO NOT DELETE (Optional):27901}  Follow up plan: Follow up call scheduled for 07/21/23    Encounter Outcome:  Pt. Visit Completed {THN Tip this will not be part of the note when signed-REQUIRED REPORT FIELD DO NOT DELETE (Optional):27901}   L. Noelle Penner, RN, BSN, CCM [facility] Care Management Community Coordinator Office number [phone]

## 2023-06-20 NOTE — Patient Instructions (Signed)
Visit Information  Thank you for taking time to visit with me today. Please don't hesitate to contact me if I can be of assistance to you.   Following are the goals we discussed today:   Goals Addressed   None     Our next appointment is by telephone on 07/21/23 at 4 pm  Please call the care guide team at [phone] if you need to cancel or reschedule your appointment.   If you are experiencing a Mental Health or Behavioral Health Crisis or need someone to talk to, please call the Suicide and Crisis Lifeline: 988 call the [place] National Suicide Prevention Lifeline: 825-212-0383 or TTY: 770-297-0395 TTY (213)485-9264) to talk to a trained counselor call 1-800-273-TALK (toll free, 24 hour hotline) call the [facility]: 719-150-0017 call 911   Patient verbalizes understanding of instructions and care plan provided today and agrees to view in MyChart. Active MyChart status and patient understanding of how to access instructions and care plan via MyChart confirmed with patient.     The patient has been provided with contact information for the care management team and has been advised to call with any health related questions or concerns.    L. Noelle Penner, RN, BSN, CCM [facility] Care Management Community Coordinator Office number [phone]

## 2023-06-22 ENCOUNTER — Encounter: Payer: Self-pay | Admitting: Internal Medicine

## 2023-06-22 ENCOUNTER — Encounter: Payer: Self-pay | Admitting: Urology

## 2023-06-23 NOTE — Telephone Encounter (Signed)
Please see pt message below and advise.

## 2023-06-24 ENCOUNTER — Encounter: Payer: Self-pay | Admitting: Internal Medicine

## 2023-06-24 ENCOUNTER — Ambulatory Visit (INDEPENDENT_AMBULATORY_CARE_PROVIDER_SITE_OTHER): Payer: 59 | Admitting: Internal Medicine

## 2023-06-24 VITALS — BP 136/86 | HR 86 | Ht 74.0 in | Wt 275.0 lb

## 2023-06-24 DIAGNOSIS — E291 Testicular hypofunction: Secondary | ICD-10-CM | POA: Diagnosis not present

## 2023-06-24 DIAGNOSIS — R42 Dizziness and giddiness: Secondary | ICD-10-CM

## 2023-06-24 DIAGNOSIS — R7303 Prediabetes: Secondary | ICD-10-CM

## 2023-06-24 DIAGNOSIS — R61 Generalized hyperhidrosis: Secondary | ICD-10-CM

## 2023-06-24 DIAGNOSIS — R55 Syncope and collapse: Secondary | ICD-10-CM

## 2023-06-24 NOTE — Patient Instructions (Signed)
Please continue to take medications as prescribed.  Please continue to follow low salt diet and perform moderate exercise/walking at least 150 mins/week.  Please consider discussing about Cymbalta to your Neurologist as it could be a reason for sweating as well.

## 2023-06-25 NOTE — Assessment & Plan Note (Signed)
Testosterone level was WNL recently On testosterone injections now Followed by urology If persistent night sweats, may need to switch to testosterone cream or discontinue

## 2023-06-25 NOTE — Assessment & Plan Note (Signed)
Lab Results  Component Value Date   HGBA1C 6.1 (H) 03/24/2023   Advised to follow DASH diet

## 2023-06-25 NOTE — Assessment & Plan Note (Addendum)
Unclear etiology Had discontinued Prozac due to concern for serotonin syndrome as he also takes Cymbalta, but still has excessive sweating Recently started testosterone supplement might also be contributing-if persistent night sweats, may need to change from testosterone injection to cream No other B symptoms such as fever, chills, weight loss or LAD If persistent, may need further investigation up to bone marrow biopsy

## 2023-06-25 NOTE — Progress Notes (Signed)
Established Patient Office Visit  Subjective:  Patient ID: Elijah Shaffer, male    DOB: December 18, 1980  Age: 41 y.o. MRN: [id number]  CC:  Chief Complaint  Patient presents with   Hypertension    Three month follow up    Blood Sugar Problem    Patient wants his sugar level checked, he said Sunday he started not feeling well, got hot and was sweating really bad   Anxiety    Three month follow up     HPI Elijah Shaffer is a 42 y.o. male with past medical history of GERD, eczema, anxiety and chronic dyspnea who presents for f/u of his chronic medical conditions.  His blood pressure was wnl today.  He has chronic palpitations and dyspnea on exertion.  He takes amlodipine 5 mg QD and 20 mg BID.  Denies any chest pain currently.  IBS: Denies any abdominal pain currently.  He usually has chronic constipation and takes Amitiza for it.  Denies any melena or hematochezia.  Anxiety: He takes Cymbalta currently.  He still has episodes of anxiety, excessive sweating and tremors.  Prozac was recently discontinued considering his excessive sweating, but still has persistent concern.  He was placed on clonazepam by Dr. Gerilyn Pilgrim, but was discontinued by [facility].  He has multiple physical complaints such as constant itching, dyspnea, tremors, etc., likely somewhat attributed to anxiety.  He had urology evaluation for low testosterone.  He has been placed on testosterone injections.  Denies any dysuria, hematuria or urethral discharge currently.  He has chronic fatigue, but denies any fever, chills, hemoptysis, recent weight loss or LAD.  He reports an episode of hot flashes followed by excessive sweating and lightheadedness while at church 2 days ago.  He went to the last pew, he had some water and peppermint, and lied down, which improved his symptoms.  Denies any chest pain or palpitations.   Past Medical History:  Diagnosis Date   Acute renal injury (HCC) 06/28/2017   Anxiety     Aspiration pneumonia of right upper lobe due to gastric secretions (HCC)    Diverticulitis    GERD (gastroesophageal reflux disease) 01/08/2018   Seasonal allergies    Syncope 06/26/2021    Past Surgical History:  Procedure Laterality Date   BIOPSY  03/18/2021   Procedure: BIOPSY;  Surgeon: Dolores Frame, MD;  Location: [facility];  Service: Gastroenterology;;  esophageal at Z-line   BIOPSY  05/23/2021   Procedure: BIOPSY;  Surgeon: Dolores Frame, MD;  Location: [facility];  Service: Gastroenterology;;   CARDIOPULMONARY EXERCISE TEST (CPX)  01/09/2022   Interpretation limited by submaximal effort.  Severe functional impairment when compared to max sedentary norms.  No cardiopulmonary limitations.  Noted tremor and generalized weakness that lead to premature exercise termination-consider neuromuscular evaluation.   COLONOSCOPY N/A 09/22/2017   Procedure: COLONOSCOPY;  Surgeon: Malissa Hippo, MD;  Location: [facility];  Service: Endoscopy;  Laterality: N/A;  9:55   COLONOSCOPY WITH PROPOFOL N/A 05/23/2021   Procedure: COLONOSCOPY WITH PROPOFOL;  Surgeon: Dolores Frame, MD;  Location: [facility];  Service: Gastroenterology;  Laterality: N/A;  7:30   ESOPHAGOGASTRODUODENOSCOPY (EGD) WITH PROPOFOL N/A 03/18/2021   Procedure: ESOPHAGOGASTRODUODENOSCOPY (EGD) WITH PROPOFOL;  Surgeon: Dolores Frame, MD;  [facility]: [facility];  Service: Gastroenterology;  Laterality: N/A;   ESOPHAGOGASTRODUODENOSCOPY (EGD) WITH PROPOFOL N/A 05/23/2021   Procedure: ESOPHAGOGASTRODUODENOSCOPY (EGD) WITH PROPOFOL;  Surgeon: Dolores Frame, MD;  Location: [facility];  Service: Gastroenterology;  Laterality: N/A;   POLYPECTOMY  09/22/2017   Procedure: POLYPECTOMY;  Surgeon: Malissa Hippo, MD;  Location: [facility];  Service: Endoscopy;;   RIGHT/LEFT HEART CATH AND CORONARY ANGIOGRAPHY N/A 12/12/2021   Procedure: RIGHT/LEFT HEART CATH AND  CORONARY ANGIOGRAPHY;  Surgeon: Dolores Patty, MD;  Location: [facility];  Service: Cardiovascular:: Normal Coronaries & Hemodynamics.  EF 50-55%. RA = 4 mmHg, RV = 23/4; PA = 22/6 (14) & PCW = 7; Ao sat = 95%; PA sat = 76%, 76%& SVC sat = 74%Fick cardiac output/index = 7.1/3.0; PVR = 1.0 WU   TRANSTHORACIC ECHOCARDIOGRAM  02/24/2021   EF 60 to 65%.  Normal LV function.  Normal valves.  Mild RV dilation with normal function.   ZIO PATCH EVENT MONITOR  01/2022   Sinus rhythm with heart rate range 24 to 159 bpm, 1  AVB and Wenckebach block noted along with rare PACs and PVCs.  No arrhythmias.    Family History  Problem Relation Age of Onset   Healthy Mother    Cervical cancer Mother    Cancer Father    Colon cancer Maternal Grandmother    Colon cancer Maternal Grandfather     Social History   Socioeconomic History   Marital status: Single    Spouse name: Not on file   Number of children: Not on file   Years of education: Not on file   Highest education level: Not on file  Occupational History   Not on file  Tobacco Use   Smoking status: Never    Passive exposure: Past   Smokeless tobacco: Never  Vaping Use   Vaping status: Never Used  Substance and Sexual Activity   Alcohol use: Yes    Comment: occ   Drug use: No   Sexual activity: Not on file  Other Topics Concern   Not on file  Social History Narrative   He currently has his own lawn care landscaping service. He previous had 80 yards before he got sick and since decreased. He works around a Interior and spatial designer. One of the chemical names is roundup.    Social Determinants of Health   Financial Resource Strain: Not on file  Food Insecurity: No Food Insecurity (06/20/2023)   Hunger Vital Sign    Worried About Running Out of Food in the Last Year: Never true    Ran Out of Food in the Last Year: Never true  Transportation Needs: No Transportation Needs (12/27/2022)   PRAPARE - Scientist, research (physical sciences) (Medical): No    Lack of Transportation (Non-Medical): No  Physical Activity: Insufficiently Active (03/29/2022)   Exercise Vital Sign    Days of Exercise per Week: 2 days    Minutes of Exercise per Session: 10 min  Stress: Stress Concern Present (05/07/2022)   [employer] of Occupational Health - Occupational Stress Questionnaire    Feeling of Stress : To some extent  Social Connections: Not on file  Intimate Partner Violence: Not At Risk (06/20/2023)   Humiliation, Afraid, Rape, and Kick questionnaire    Fear of Current or Ex-Partner: No    Emotionally Abused: No    Physically Abused: No    Sexually Abused: No    Outpatient Medications Prior to Visit  Medication Sig Dispense Refill   albuterol (VENTOLIN HFA) 108 (90 Base) MCG/ACT inhaler INHALE 2 PUFFS BY MOUTH EVERY 6 HOURS AS NEEDED FOR SHORTNESS OF BREATH 9  g 0   amLODipine (NORVASC) 5 MG tablet Take 1 tablet (5 mg total) by mouth daily. 90 tablet 1   aspirin EC 81 MG tablet Take 81 mg by mouth daily. Swallow whole.     b complex vitamins capsule Take 1 capsule by mouth daily. With D3 gummie     betamethasone dipropionate 0.05 % lotion Apply 1-2 times daily to affected itchy areas at scalp. Avoid applying to face, groin, and axilla. Use as directed. Long-term use can cause thinning of the skin. 60 mL 2   cetirizine (ZYRTEC) 10 MG tablet Take 10 mg by mouth daily.     Cholecalciferol (VITAMIN D-3 PO) Take by mouth.     clindamycin (CLEOCIN T) 1 % external solution Apply topically to acne bumps at aa's qd 30 mL 3   clobetasol (TEMOVATE) 0.05 % external solution Apply 1 application. topically 2 (two) times daily. Use as directed.  Mix with cerave cream. Use for up to 4 weeks. Avoid applying to face, groin, and axilla. Use as directed. 50 mL 1   clobetasol cream (TEMOVATE) 0.05 % Apply small amount daily before bed to right ankle or other areas of body as needed for itchy rash Avoid applying to face, groin, and axilla  Use as directed. 30 g 0   cyclobenzaprine (FLEXERIL) 5 MG tablet Take 1 tablet (5 mg total) by mouth at bedtime as needed for muscle spasms. 30 tablet 1   diphenhydrAMINE-zinc acetate (BENADRYL EXTRA STRENGTH) cream Apply 1 application topically 3 (three) times daily as needed for itching. 28.4 g 0   docusate sodium (COLACE) 100 MG capsule Take 100 mg by mouth 2 (two) times daily.     DULoxetine (CYMBALTA) 30 MG capsule Take 60 mg by mouth daily.     dupilumab (DUPIXENT) 300 MG/2ML prefilled syringe Inject 300 mg into the skin every 14 (fourteen) days. Starting at day 15 for maintenance. 4 mL 5   Dupilumab (DUPIXENT) 300 MG/2ML SOPN Inject 300 mg into the skin every 14 (fourteen) days. Starting at day 15 for maintenance. 4 mL 5   fenofibrate (TRICOR) 145 MG tablet Take 1 tablet (145 mg total) by mouth daily. 90 tablet 3   Fluocinolone Acetonide 0.01 % OIL Use 1 - 2 drop qd/bid prn for scale at ears 20 mL 2   fluocinonide (LIDEX) 0.05 % external solution Apply once or twice daily to affected areas on scalp up to 2 weeks as needed for itching. Avoid applying to face, groin, and axilla. 60 mL 2   fluticasone (FLONASE ALLERGY RELIEF) 50 MCG/ACT nasal spray Place 2 sprays into both nostrils daily as needed for allergies or rhinitis. (Patient taking differently: Place 2 sprays into both nostrils daily.) 9.9 mL 2   gabapentin (NEURONTIN) 300 MG capsule Take 300 mg by mouth 3 (three) times daily.     guaiFENesin (MUCINEX) 600 MG 12 hr tablet Take by mouth.     hydrOXYzine (VISTARIL) 25 MG capsule Take 1 capsule (25 mg total) by mouth every 8 (eight) hours as needed. Take before bed as needed for itchy. Can cause drowsiness. 60 capsule 2   ketoconazole (NIZORAL) 2 % shampoo Apply 1 Application topically daily as needed for irritation. 120 mL 11   montelukast (SINGULAIR) 10 MG tablet Take 1 tablet (10 mg total) by mouth at bedtime. 30 tablet 5   naproxen (NAPROSYN) 500 MG tablet Take 500 mg by mouth 2 (two)  times daily.     NEEDLE, DISP, 18 G 18G  X 1-1/2" MISC Use to draw up testosterone for injection 2 each 12   omega-3 acid ethyl esters (LOVAZA) 1 g capsule Take 2 capsules (2 g total) by mouth 2 (two) times daily. 120 capsule 3   omeprazole (PRILOSEC) 40 MG capsule Take 1 capsule by mouth twice daily 180 capsule 1   ondansetron (ZOFRAN) 4 MG tablet Take 1 tablet (4 mg total) by mouth every 6 (six) hours as needed for nausea. 20 tablet 0   potassium gluconate (RA POTASSIUM GLUCONATE) 595 (99 K) MG TABS tablet Take 1 tablet by mouth daily.     propranolol (INDERAL) 20 MG tablet Take one tablet 20 mg  twice daily  may take an  additional dose of 20 mg  as needed for palpitations 190 tablet 3   rosuvastatin (CRESTOR) 40 MG tablet Take 1 tablet (40 mg total) by mouth daily. 90 tablet 3   senna (SENOKOT) 8.6 MG tablet Take 1 tablet by mouth daily. prn     SYRINGE-NEEDLE, DISP, 3 ML 22G X 1-1/2" 3 ML MISC Use to inject testosterone 2 each 12   tacrolimus (PROTOPIC) 0.1 % ointment APPLY TOPICALLY TO AFFECTED AREAS OF BODY FOR RASH DAILY OR TWICE DAILY AS NEEDED 30 g 0   testosterone cypionate (DEPOTESTOSTERONE CYPIONATE) 200 MG/ML injection Inject 0.5 mLs (100 mg total) into the muscle every 7 (seven) days. 10 mL 1   triamcinolone cream (KENALOG) 0.1 %      Upadacitinib ER (RINVOQ) 15 MG TB24 Take 1 tablet (15 mg total) by mouth daily. 30 tablet 5   Facility-Administered Medications Prior to Visit  Medication Dose Route Frequency Provider Last Rate Last Admin   testosterone cypionate (DEPOTESTOSTERONE CYPIONATE) injection 100 mg  100 mg Intramuscular Weekly Bjorn Pippin, MD   100 mg at 04/24/23 1125    Allergies  Allergen Reactions   Gramineae Pollens Itching   Claritin [Loratadine] Other (See Comments)    Heart race   Loratadine-Pseudoephedrine Er Palpitations   Pollen Extract Other (See Comments)    Seasonal allergies    ROS Review of Systems  Constitutional:  Positive for fatigue. Negative  for chills and fever.  HENT:  Negative for congestion and sore throat.   Eyes:  Negative for pain and discharge.  Respiratory:  Positive for shortness of breath. Negative for cough.   Cardiovascular:  Positive for palpitations. Negative for chest pain.  Gastrointestinal:  Positive for constipation. Negative for diarrhea, nausea and vomiting.  Endocrine: Negative for polydipsia and polyuria.  Genitourinary:  Negative for dysuria and hematuria.  Musculoskeletal:  Positive for arthralgias, back pain, myalgias and neck pain. Negative for neck stiffness.  Neurological:  Positive for dizziness and tremors. Negative for headaches.  Psychiatric/Behavioral:  Positive for sleep disturbance. Negative for agitation and behavioral problems. The patient is nervous/anxious.       Objective:    Physical Exam Vitals reviewed.  Constitutional:      General: He is not in acute distress.    Appearance: He is not diaphoretic.  HENT:     Head: Normocephalic and atraumatic.     Nose: Nose normal.     Mouth/Throat:     Mouth: Mucous membranes are moist.  Eyes:     General: No scleral icterus.    Extraocular Movements: Extraocular movements intact.  Cardiovascular:     Rate and Rhythm: Normal rate and regular rhythm.     Pulses: Normal pulses.     Heart sounds: Normal heart sounds. No murmur heard. Pulmonary:  Breath sounds: Normal breath sounds. No wheezing or rales.  Abdominal:     General: Bowel sounds are normal.     Palpations: Abdomen is soft.     Tenderness: There is no abdominal tenderness.  Musculoskeletal:        General: Tenderness (Mid thoracic and lumbar spine area) present.     Cervical back: Neck supple. No tenderness.     Right lower leg: No edema.     Left lower leg: No edema.  Skin:    General: Skin is warm.     Coloration: Skin is not jaundiced.  Neurological:     General: No focal deficit present.     Mental Status: He is alert and oriented to person, place, and time.      Cranial Nerves: No cranial nerve deficit.     Sensory: No sensory deficit.     Motor: No weakness.     Comments: Functional tremors on right hand  Psychiatric:        Mood and Affect: Mood is anxious and depressed. Affect is flat.        Thought Content: Thought content does not include homicidal or suicidal ideation.     BP 136/86 (BP Location: Left Arm, Patient Position: Sitting, Cuff Size: Large)   Pulse 86   Ht 6\' 2"  (1.88 m)   Wt 275 lb (124.7 kg)   SpO2 94%   BMI 35.31 kg/m  Wt Readings from Last 3 Encounters:  06/24/23 275 lb (124.7 kg)  06/06/23 265 lb 12.8 oz (120.6 kg)  05/14/23 270 lb (122.5 kg)    Lab Results  Component Value Date   TSH 3.110 12/02/2022   Lab Results  Component Value Date   WBC 11.8 (H) 01/07/2023   HGB 16.1 05/08/2023   HCT 48.7 05/08/2023   MCV 89 01/07/2023   PLT 291 01/07/2023   Lab Results  Component Value Date   NA 142 05/22/2023   K 4.6 05/22/2023   CO2 20 05/22/2023   GLUCOSE 91 05/22/2023   BUN 18 05/22/2023   CREATININE 2.18 (H) 05/22/2023   BILITOT 0.8 05/22/2023   ALKPHOS 69 05/22/2023   AST 79 (H) 05/22/2023   ALT 94 (H) 05/22/2023   PROT 7.6 05/22/2023   ALBUMIN 5.3 (H) 05/22/2023   CALCIUM 10.9 (H) 05/22/2023   ANIONGAP 10 12/27/2021   EGFR 38 (L) 05/22/2023   GFR 68.05 10/02/2021   Lab Results  Component Value Date   CHOL 128 05/22/2023   Lab Results  Component Value Date   HDL 32 (L) 05/22/2023   Lab Results  Component Value Date   LDLCALC 52 05/22/2023   Lab Results  Component Value Date   TRIG 279 (H) 05/22/2023   Lab Results  Component Value Date   CHOLHDL 4.0 05/22/2023   Lab Results  Component Value Date   HGBA1C 6.1 (H) 03/24/2023      Assessment & Plan:   Problem List Items Addressed This Visit       Cardiovascular and Mediastinum   Postural dizziness with presyncope - Primary    Recent episode appears to be presyncopal Advised again to maintain adequate hydration and eat  at regular intervals He was concerned about hypoglycemia, but it is less likely without skipping meals in his condition - HbA1c : 6.1 He likely has panic episodes at times as well, has Vistaril as needed for it        Endocrine   Hypogonadism, male  Testosterone level was WNL recently On testosterone injections now Followed by urology If persistent night sweats, may need to switch to testosterone cream or discontinue        Other   Prediabetes    Lab Results  Component Value Date   HGBA1C 6.1 (H) 03/24/2023   Advised to follow DASH diet      Excessive sweating    Unclear etiology Had discontinued Prozac due to concern for serotonin syndrome as he also takes Cymbalta, but still has excessive sweating Recently started testosterone supplement might also be contributing-if persistent night sweats, may need to change from testosterone injection to cream No other B symptoms such as fever, chills, weight loss or LAD If persistent, may need further investigation up to bone marrow biopsy      No orders of the defined types were placed in this encounter.   Follow-up: Return in about 4 months (around 10/24/2023).    Anabel Halon, MD

## 2023-06-25 NOTE — Assessment & Plan Note (Addendum)
Recent episode appears to be presyncopal Advised again to maintain adequate hydration and eat at regular intervals He was concerned about hypoglycemia, but it is less likely without skipping meals in his condition - HbA1c : 6.1 He likely has panic episodes at times as well, has Vistaril as needed for it

## 2023-07-03 ENCOUNTER — Other Ambulatory Visit: Payer: 59

## 2023-07-03 DIAGNOSIS — E291 Testicular hypofunction: Secondary | ICD-10-CM

## 2023-07-04 LAB — HEMOGLOBIN AND HEMATOCRIT, BLOOD
Hematocrit: 49.6 % (ref 37.5–51.0)
Hemoglobin: 16.8 g/dL (ref 13.0–17.7)

## 2023-07-04 LAB — TESTOSTERONE: Testosterone: 723 ng/dL (ref 264–916)

## 2023-07-10 ENCOUNTER — Other Ambulatory Visit: Payer: Self-pay | Admitting: Internal Medicine

## 2023-07-10 ENCOUNTER — Encounter: Payer: Self-pay | Admitting: Internal Medicine

## 2023-07-10 ENCOUNTER — Ambulatory Visit: Payer: 59 | Admitting: Urology

## 2023-07-10 VITALS — BP 126/77 | HR 71 | Ht 74.0 in | Wt 275.0 lb

## 2023-07-10 DIAGNOSIS — R809 Proteinuria, unspecified: Secondary | ICD-10-CM

## 2023-07-10 DIAGNOSIS — R5383 Other fatigue: Secondary | ICD-10-CM

## 2023-07-10 DIAGNOSIS — N1831 Chronic kidney disease, stage 3a: Secondary | ICD-10-CM | POA: Insufficient documentation

## 2023-07-10 DIAGNOSIS — N183 Chronic kidney disease, stage 3 unspecified: Secondary | ICD-10-CM | POA: Diagnosis not present

## 2023-07-10 DIAGNOSIS — E291 Testicular hypofunction: Secondary | ICD-10-CM

## 2023-07-10 DIAGNOSIS — N1832 Chronic kidney disease, stage 3b: Secondary | ICD-10-CM | POA: Insufficient documentation

## 2023-07-10 MED ORDER — TESTOSTERONE 25 MG/2.5GM (1%) TD GEL: 2.5000 g | Freq: Every morning | TRANSDERMAL | 5 refills | Status: DC

## 2023-07-10 NOTE — Progress Notes (Signed)
Subjective: 1. Hypogonadism in male   2. Other fatigue       Consult requested by Dr. Trena Platt  07/10/23 Elijah Shaffer returns today in f/u for his history of hypogonadism.  He was prescribed Testim at his last visit but it wasn't covered so he has transitioned to injections.  .His T prior to this visit was 723 with a  Hgb of 16.8.  He has had some bleeding with the injections and is interested in looking at the injections again.  His last Cr was up to 2.18 with a GFR of 38 at is last visit which was worse than in May when it was 58.    04/10/23: Elijah Shaffer returns today in f/u for his history of hypogonadism.  His repeat T on 02/06/23 was 356 with a free T of 91.6 but a repeat prior to this visit is 254.  LH/FSH are on the lower side of normal and his prolactin and estradiol were normal.   He has some issues with fatigue but it is intermittent.    02/06/23: Elijah Shaffer is a 42 yo male who is sent for low testosterone of 157 on his labs from 12/24/22.  He was having fatigue and reduced energy.  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  He doesn't report issues with reduced libido or erections but he is not active.  His Hgb was 18.2 on 12/23/22 but it was back down to 16.1 on 01/07/23 and that was felt to be related to improper use of his CPAP that he is on for OSA.  He is on gabapentin for sciatica.   He has had no recent weight loss.  His TSH was normal on 12/02/22.  He has CKD3 and hyperlipidemia.  His IPSS is 1. With nocturia x 1.   ROS:  Review of Systems  Constitutional:  Positive for malaise/fatigue.  HENT:  Positive for congestion.   Neurological:  Positive for tingling.    Allergies  Allergen Reactions   Gramineae Pollens Itching   Claritin [Loratadine] Other (See Comments)    Heart race   Loratadine-Pseudoephedrine Er Palpitations   Pollen Extract Other (See Comments)    Seasonal allergies    Past Medical History:  Diagnosis Date    Acute renal injury (HCC) 06/28/2017   Anxiety    Aspiration pneumonia of right upper lobe due to gastric secretions (HCC)    Diverticulitis    GERD (gastroesophageal reflux disease) 01/08/2018   Seasonal allergies    Syncope 06/26/2021    Past Surgical History:  Procedure Laterality Date   BIOPSY  03/18/2021   Procedure: BIOPSY;  Surgeon: Dolores Frame, MD;  Location: [facility];  Service: Gastroenterology;;  esophageal at Z-line   BIOPSY  05/23/2021   Procedure: BIOPSY;  Surgeon: Dolores Frame, MD;  Location: [facility];  Service: Gastroenterology;;   CARDIOPULMONARY EXERCISE TEST (CPX)  01/09/2022   Interpretation limited by submaximal effort.  Severe functional impairment when compared to max sedentary norms.  No cardiopulmonary limitations.  Noted tremor and generalized weakness that lead to premature exercise termination-consider neuromuscular evaluation.   COLONOSCOPY N/A 09/22/2017   Procedure: COLONOSCOPY;  Surgeon: Malissa Hippo, MD;  Location: [facility];  Service: Endoscopy;  Laterality: N/A;  9:55   COLONOSCOPY WITH PROPOFOL N/A 05/23/2021   Procedure: COLONOSCOPY WITH PROPOFOL;  Surgeon: Dolores Frame, MD;  Location: [facility];  Service: Gastroenterology;  Laterality: N/A;  7:30   ESOPHAGOGASTRODUODENOSCOPY (EGD) WITH PROPOFOL N/A 03/18/2021   Procedure: ESOPHAGOGASTRODUODENOSCOPY (EGD) WITH PROPOFOL;  Surgeon: Dolores Frame, MD;  Location: [facility];  Service: Gastroenterology;  Laterality: N/A;   ESOPHAGOGASTRODUODENOSCOPY (EGD) WITH PROPOFOL N/A 05/23/2021   Procedure: ESOPHAGOGASTRODUODENOSCOPY (EGD) WITH PROPOFOL;  Surgeon: Dolores Frame, MD;  Location: [facility];  Service: Gastroenterology;  Laterality: N/A;   POLYPECTOMY  09/22/2017   Procedure: POLYPECTOMY;  Surgeon: Malissa Hippo, MD;  Location: [facility];  Service: Endoscopy;;   RIGHT/LEFT HEART CATH AND CORONARY ANGIOGRAPHY N/A  12/12/2021   Procedure: RIGHT/LEFT HEART CATH AND CORONARY ANGIOGRAPHY;  Surgeon: Dolores Patty, MD;  Location: [facility];  Service: Cardiovascular:: Normal Coronaries & Hemodynamics.  EF 50-55%. RA = 4 mmHg, RV = 23/4; PA = 22/6 (14) & PCW = 7; Ao sat = 95%; PA sat = 76%, 76%& SVC sat = 74%Fick cardiac output/index = 7.1/3.0; PVR = 1.0 WU   TRANSTHORACIC ECHOCARDIOGRAM  02/24/2021   EF 60 to 65%.  Normal LV function.  Normal valves.  Mild RV dilation with normal function.   ZIO PATCH EVENT MONITOR  01/2022   Sinus rhythm with heart rate range 24 to 159 bpm, 1  AVB and Wenckebach block noted along with rare PACs and PVCs.  No arrhythmias.    Social History   Socioeconomic History   Marital status: Single    Spouse name: Not on file   Number of children: Not on file   Years of education: Not on file   Highest education level: Not on file  Occupational History   Not on file  Tobacco Use   Smoking status: Never    Passive exposure: Past   Smokeless tobacco: Never  Vaping Use   Vaping status: Never Used  Substance and Sexual Activity   Alcohol use: Yes    Comment: occ   Drug use: No   Sexual activity: Not on file  Other Topics Concern   Not on file  Social History Narrative   He currently has his own lawn care landscaping service. He previous had 80 yards before he got sick and since decreased. He works around a Interior and spatial designer. One of the chemical names is roundup.    Social Determinants of Health   Financial Resource Strain: Not on file  Food Insecurity: No Food Insecurity (06/20/2023)   Hunger Vital Sign    Worried About Running Out of Food in the Last Year: Never true    Ran Out of Food in the Last Year: Never true  Transportation Needs: No Transportation Needs (12/27/2022)   PRAPARE - Administrator, Civil Service (Medical): No    Lack of Transportation (Non-Medical): No  Physical Activity: Insufficiently Active (03/29/2022)   Exercise Vital Sign     Days of Exercise per Week: 2 days    Minutes of Exercise per Session: 10 min  Stress: Stress Concern Present (05/07/2022)   [employer] of Occupational Health - Occupational Stress Questionnaire    Feeling of Stress : To some extent  Social Connections: Not on file  Intimate Partner Violence: Not At Risk (06/20/2023)   Humiliation, Afraid, Rape, and Kick questionnaire    Fear of Current or Ex-Partner: No    Emotionally Abused: No    Physically Abused: No    Sexually Abused: No    Family History  Problem Relation Age of Onset   Healthy Mother    Cervical cancer Mother  Cancer Father    Colon cancer Maternal Grandmother    Colon cancer Maternal Grandfather     Anti-infectives: Anti-infectives (From admission, onward)    None       Current Outpatient Medications  Medication Sig Dispense Refill   albuterol (VENTOLIN HFA) 108 (90 Base) MCG/ACT inhaler INHALE 2 PUFFS BY MOUTH EVERY 6 HOURS AS NEEDED FOR SHORTNESS OF BREATH 9 g 0   amLODipine (NORVASC) 5 MG tablet Take 1 tablet (5 mg total) by mouth daily. 90 tablet 1   aspirin EC 81 MG tablet Take 81 mg by mouth daily. Swallow whole.     b complex vitamins capsule Take 1 capsule by mouth daily. With D3 gummie     betamethasone dipropionate 0.05 % lotion Apply 1-2 times daily to affected itchy areas at scalp. Avoid applying to face, groin, and axilla. Use as directed. Long-term use can cause thinning of the skin. 60 mL 2   cetirizine (ZYRTEC) 10 MG tablet Take 10 mg by mouth daily.     Cholecalciferol (VITAMIN D-3 PO) Take by mouth.     clindamycin (CLEOCIN T) 1 % external solution Apply topically to acne bumps at aa's qd 30 mL 3   clobetasol (TEMOVATE) 0.05 % external solution Apply 1 application. topically 2 (two) times daily. Use as directed.  Mix with cerave cream. Use for up to 4 weeks. Avoid applying to face, groin, and axilla. Use as directed. 50 mL 1   clobetasol cream (TEMOVATE) 0.05 % Apply small amount daily  before bed to right ankle or other areas of body as needed for itchy rash Avoid applying to face, groin, and axilla Use as directed. 30 g 0   cyclobenzaprine (FLEXERIL) 5 MG tablet Take 1 tablet (5 mg total) by mouth at bedtime as needed for muscle spasms. 30 tablet 1   diphenhydrAMINE-zinc acetate (BENADRYL EXTRA STRENGTH) cream Apply 1 application topically 3 (three) times daily as needed for itching. 28.4 g 0   docusate sodium (COLACE) 100 MG capsule Take 100 mg by mouth 2 (two) times daily.     DULoxetine (CYMBALTA) 30 MG capsule Take 60 mg by mouth daily.     dupilumab (DUPIXENT) 300 MG/2ML prefilled syringe Inject 300 mg into the skin every 14 (fourteen) days. Starting at day 15 for maintenance. 4 mL 5   Dupilumab (DUPIXENT) 300 MG/2ML SOPN Inject 300 mg into the skin every 14 (fourteen) days. Starting at day 15 for maintenance. 4 mL 5   fenofibrate (TRICOR) 145 MG tablet Take 1 tablet (145 mg total) by mouth daily. 90 tablet 3   Fluocinolone Acetonide 0.01 % OIL Use 1 - 2 drop qd/bid prn for scale at ears 20 mL 2   fluocinonide (LIDEX) 0.05 % external solution Apply once or twice daily to affected areas on scalp up to 2 weeks as needed for itching. Avoid applying to face, groin, and axilla. 60 mL 2   fluticasone (FLONASE ALLERGY RELIEF) 50 MCG/ACT nasal spray Place 2 sprays into both nostrils daily as needed for allergies or rhinitis. (Patient taking differently: Place 2 sprays into both nostrils daily.) 9.9 mL 2   gabapentin (NEURONTIN) 300 MG capsule Take 300 mg by mouth 3 (three) times daily.     guaiFENesin (MUCINEX) 600 MG 12 hr tablet Take by mouth.     hydrOXYzine (VISTARIL) 25 MG capsule Take 1 capsule (25 mg total) by mouth every 8 (eight) hours as needed. Take before bed as needed for itchy. Can cause drowsiness.  60 capsule 2   ketoconazole (NIZORAL) 2 % shampoo Apply 1 Application topically daily as needed for irritation. 120 mL 11   montelukast (SINGULAIR) 10 MG tablet Take 1 tablet  (10 mg total) by mouth at bedtime. 30 tablet 5   naproxen (NAPROSYN) 500 MG tablet Take 500 mg by mouth 2 (two) times daily.     NEEDLE, DISP, 18 G 18G X 1-1/2" MISC Use to draw up testosterone for injection 2 each 12   omega-3 acid ethyl esters (LOVAZA) 1 g capsule Take 2 capsules (2 g total) by mouth 2 (two) times daily. 120 capsule 3   omeprazole (PRILOSEC) 40 MG capsule Take 1 capsule by mouth twice daily 180 capsule 1   ondansetron (ZOFRAN) 4 MG tablet Take 1 tablet (4 mg total) by mouth every 6 (six) hours as needed for nausea. 20 tablet 0   potassium gluconate (RA POTASSIUM GLUCONATE) 595 (99 K) MG TABS tablet Take 1 tablet by mouth daily.     propranolol (INDERAL) 20 MG tablet Take one tablet 20 mg  twice daily  may take an  additional dose of 20 mg  as needed for palpitations 190 tablet 3   rosuvastatin (CRESTOR) 40 MG tablet Take 1 tablet (40 mg total) by mouth daily. 90 tablet 3   senna (SENOKOT) 8.6 MG tablet Take 1 tablet by mouth daily. prn     SYRINGE-NEEDLE, DISP, 3 ML 22G X 1-1/2" 3 ML MISC Use to inject testosterone 2 each 12   tacrolimus (PROTOPIC) 0.1 % ointment APPLY TOPICALLY TO AFFECTED AREAS OF BODY FOR RASH DAILY OR TWICE DAILY AS NEEDED 30 g 0   Testosterone 25 MG/2.5GM (1%) GEL Place 2.5 g onto the skin in the morning. 75 g 5   triamcinolone cream (KENALOG) 0.1 %      Upadacitinib ER (RINVOQ) 15 MG TB24 Take 1 tablet (15 mg total) by mouth daily. 30 tablet 5   No current facility-administered medications for this visit.     Objective: Vital signs in last 24 hours: BP 126/77   Pulse 71   Ht 6\' 2"  (1.88 m)   Wt 275 lb (124.7 kg)   BMI 35.31 kg/m   Intake/Output from previous day: No intake/output data recorded. Intake/Output this shift: @IOTHISSHIFT @   Physical Exam  Lab Results:  No results found for this or any previous visit (from the past 24 hour(s)).  BMET No results for input(s): "NA", "K", "CL", "CO2", "GLUCOSE", "BUN", "CREATININE", "CALCIUM"  in the last 72 hours. PT/INR No results for input(s): "LABPROT", "INR" in the last 72 hours. ABG No results for input(s): "PHART", "HCO3" in the last 72 hours.  Invalid input(s): "PCO2", "PO2" UA has 1+ protein. Recent Results (from the past 2160 hour(s))  Hemoglobin and hematocrit, blood     Status: None   Collection Time: 05/08/23  1:15 PM  Result Value Ref Range   Hemoglobin 16.1 13.0 - 17.7 g/dL   Hematocrit 57.8 46.9 - 51.0 %  Testosterone     Status: None   Collection Time: 05/08/23  1:15 PM  Result Value Ref Range   Testosterone 424 264 - 916 ng/dL    Comment: Adult male reference interval is based on a population of healthy nonobese males (BMI <30) between 31 and 28 years old. Travison, et.al. JCEM [phone]. PMID: 27253664.   Lipid Panel     Status: Abnormal   Collection Time: 05/22/23 12:05 PM  Result Value Ref Range   Cholesterol, Total 128 100 -  199 mg/dL   Triglycerides 213 (H) 0 - 149 mg/dL   HDL 32 (L) >08 mg/dL   VLDL Cholesterol Cal 44 (H) 5 - 40 mg/dL   LDL Chol Calc (NIH) 52 0 - 99 mg/dL   Chol/HDL Ratio 4.0 0.0 - 5.0 ratio    Comment:                                   T. Chol/HDL Ratio                                             Men  Women                               1/2 Avg.Risk  3.4    3.3                                   Avg.Risk  5.0    4.4                                2X Avg.Risk  9.6    7.1                                3X Avg.Risk 23.4   11.0   CMP     Status: Abnormal   Collection Time: 05/22/23 12:05 PM  Result Value Ref Range   Glucose 91 70 - 99 mg/dL   BUN 18 6 - 24 mg/dL   Creatinine, Ser 6.57 (H) 0.76 - 1.27 mg/dL   eGFR 38 (L) >84 ON/GEX/5.28   BUN/Creatinine Ratio 8 (L) 9 - 20   Sodium 142 134 - 144 mmol/L   Potassium 4.6 3.5 - 5.2 mmol/L   Chloride 102 96 - 106 mmol/L   CO2 20 20 - 29 mmol/L   Calcium 10.9 (H) 8.7 - 10.2 mg/dL   Total Protein 7.6 6.0 - 8.5 g/dL   Albumin 5.3 (H) 4.1 - 5.1 g/dL   Globulin, Total  2.3 1.5 - 4.5 g/dL   Bilirubin Total 0.8 0.0 - 1.2 mg/dL   Alkaline Phosphatase 69 44 - 121 IU/L   AST 79 (H) 0 - 40 IU/L   ALT 94 (H) 0 - 44 IU/L  Hep B Surface Antibody     Status: None   Collection Time: 05/22/23 12:05 PM  Result Value Ref Range   Hep B Surface Ab, Qual Non Reactive     Comment:               Non Reactive: Inconsistent with immunity,                             less than 10 mIU/mL               Reactive:     Consistent with immunity,                             greater than  9.9 mIU/mL   Hep B Surface Antigen     Status: None   Collection Time: 05/22/23 12:05 PM  Result Value Ref Range   Hepatitis B Surface Ag Negative Negative  Hep C Antibody     Status: None   Collection Time: 05/22/23 12:05 PM  Result Value Ref Range   Hep C Virus Ab Non Reactive Non Reactive    Comment: HCV antibody alone does not differentiate between previously resolved infection and active infection. Equivocal and Reactive HCV antibody results should be followed up with an HCV RNA test to support the diagnosis of active HCV infection.   QuantiFERON-TB Gold Plus     Status: None   Collection Time: 05/22/23 12:05 PM  Result Value Ref Range   QuantiFERON Incubation Incubation performed.    QuantiFERON Criteria Comment     Comment: QuantiFERON-TB Gold Plus is a qualitative indirect test for M tuberculosis infection (including disease) and is intended for use in conjunction with risk assessment, radiography, and other medical and diagnostic evaluations. The QuantiFERON-TB Gold Plus result is determined by subtracting the Nil value from either TB antigen (Ag) value. The Mitogen tube serves as a control for the test.    QuantiFERON TB1 Ag Value 0.07 IU/mL   QuantiFERON TB2 Ag Value 0.16 IU/mL   QuantiFERON Nil Value 0.03 IU/mL   QuantiFERON Mitogen Value >10.00 IU/mL   QuantiFERON-TB Gold Plus Negative Negative    Comment: No response to M tuberculosis antigens detected. Infection with  M tuberculosis is unlikely, but high risk individuals should be considered for additional testing (ATS/IDSA/CDC Clinical Practice Guidelines, 2017). The reference range is an Antigen minus Nil result of <0.35 IU/mL. Chemiluminescence immunoassay methodology   Hemoglobin and hematocrit, blood     Status: None   Collection Time: 07/03/23 12:59 PM  Result Value Ref Range   Hemoglobin 16.8 13.0 - 17.7 g/dL   Hematocrit 40.9 81.1 - 51.0 %  Testosterone     Status: None   Collection Time: 07/03/23 12:59 PM  Result Value Ref Range   Testosterone 723 264 - 916 ng/dL    Comment: Adult male reference interval is based on a population of healthy nonobese males (BMI <30) between 53 and 79 years old. Travison, et.al. JCEM [phone]. PMID: 57846962.     Studies/Results:   Assessment/Plan: Hypogonadism with fatigue.  He is doing better symptomatically with the injections but would like to try a gel.   I will put him on a 1% testosterone gel, 2.5gm topically qam.  Proteinuria.  He has CKD 3 which is the probably culprit for the protein.  His Cr is worsening.  He has not seen nephrology.    Rising Hgb.  His Hgb is up on TRT but still normal.  I will monitor closely.   Meds ordered this encounter  Medications   Testosterone 25 MG/2.5GM (1%) GEL    Sig: Place 2.5 g onto the skin in the morning.    Dispense:  75 g    Refill:  5     Orders Placed This Encounter  Procedures   Urinalysis, Routine w reflex microscopic   Hemoglobin and hematocrit, blood    Standing Status:   Future    Standing Expiration Date:   01/09/2024   Testosterone    Standing Status:   Future    Standing Expiration Date:   01/09/2024   Hemoglobin and hematocrit, blood    Standing Status:   Future    Standing Expiration Date:  10/10/2023   Testosterone    Standing Status:   Future    Standing Expiration Date:   10/10/2023     Return in about 3 months (around 10/10/2023) for with labs.    CC: Dr. Richardean Chimera     Bjorn Pippin 07/11/2023

## 2023-07-11 ENCOUNTER — Encounter: Payer: Self-pay | Admitting: Urology

## 2023-07-15 ENCOUNTER — Other Ambulatory Visit: Payer: Self-pay | Admitting: Pulmonary Disease

## 2023-07-15 DIAGNOSIS — J4530 Mild persistent asthma, uncomplicated: Secondary | ICD-10-CM

## 2023-07-17 ENCOUNTER — Telehealth: Payer: Self-pay | Admitting: Urology

## 2023-07-17 NOTE — Telephone Encounter (Signed)
PA completed Key: [id number]

## 2023-07-17 NOTE — Telephone Encounter (Signed)
Patient called he said his insurance is waiting on you to send back the prior authorization for the  Testosterone 25 MG/2.5GM (1%) GEL

## 2023-07-21 ENCOUNTER — Ambulatory Visit: Payer: Self-pay | Admitting: *Deleted

## 2023-07-21 NOTE — Patient Outreach (Signed)
  Care Coordination   07/21/2023 Name: Elijah Shaffer MRN: [id number] DOB: 06/15/1981   Care Coordination Outreach Attempts:  An unsuccessful telephone outreach was attempted for a scheduled appointment today.  Follow Up Plan:  Additional outreach attempts will be made to offer the patient care coordination information and services.   Encounter Outcome:  No Answer   Care Coordination Interventions:  No, not indicated     Tiamarie Furnari L. Noelle Penner, RN, BSN, CCM, Care Management Coordinator [phone]

## 2023-07-25 ENCOUNTER — Other Ambulatory Visit (INDEPENDENT_AMBULATORY_CARE_PROVIDER_SITE_OTHER): Payer: Self-pay | Admitting: Gastroenterology

## 2023-07-25 ENCOUNTER — Inpatient Hospital Stay: Payer: 59 | Attending: Hematology

## 2023-07-25 DIAGNOSIS — D751 Secondary polycythemia: Secondary | ICD-10-CM | POA: Diagnosis not present

## 2023-07-25 DIAGNOSIS — K219 Gastro-esophageal reflux disease without esophagitis: Secondary | ICD-10-CM

## 2023-07-25 LAB — CBC WITH DIFFERENTIAL/PLATELET
Abs Immature Granulocytes: 0.1 10*3/uL — ABNORMAL HIGH (ref 0.00–0.07)
Basophils Absolute: 0.1 10*3/uL (ref 0.0–0.1)
Basophils Relative: 1 %
Eosinophils Absolute: 0.1 10*3/uL (ref 0.0–0.5)
Eosinophils Relative: 1 %
HCT: 50 % (ref 39.0–52.0)
Hemoglobin: 16.9 g/dL (ref 13.0–17.0)
Immature Granulocytes: 1 %
Lymphocytes Relative: 41 %
Lymphs Abs: 3.5 10*3/uL (ref 0.7–4.0)
MCH: 30.7 pg (ref 26.0–34.0)
MCHC: 33.8 g/dL (ref 30.0–36.0)
MCV: 90.7 fL (ref 80.0–100.0)
Monocytes Absolute: 0.7 10*3/uL (ref 0.1–1.0)
Monocytes Relative: 9 %
Neutro Abs: 4.2 10*3/uL (ref 1.7–7.7)
Neutrophils Relative %: 47 %
Platelets: 299 10*3/uL (ref 150–400)
RBC: 5.51 MIL/uL (ref 4.22–5.81)
RDW: 13.6 % (ref 11.5–15.5)
WBC: 8.7 10*3/uL (ref 4.0–10.5)
nRBC: 0 % (ref 0.0–0.2)

## 2023-07-29 ENCOUNTER — Ambulatory Visit (INDEPENDENT_AMBULATORY_CARE_PROVIDER_SITE_OTHER): Payer: 59 | Admitting: Gastroenterology

## 2023-07-30 ENCOUNTER — Telehealth: Payer: Self-pay | Admitting: *Deleted

## 2023-07-30 NOTE — Progress Notes (Signed)
Care Coordination Note  07/30/2023 Name: Elijah Shaffer MRN: [id number] DOB: 06-29-1981  [name] is a 42 y.o. year old male who is a primary care patient of Anabel Halon, MD and is actively engaged with the care management team. I reached out to Beverly Gust by phone today to assist with re-scheduling a follow up visit with the RN Case Manager  Follow up plan: Unsuccessful telephone outreach attempt made. A HIPAA compliant phone message was left for the patient providing contact information and requesting a return call.   [facility]  Care Coordination Care Guide  Direct Dial: [phone]

## 2023-07-31 ENCOUNTER — Ambulatory Visit (INDEPENDENT_AMBULATORY_CARE_PROVIDER_SITE_OTHER): Payer: 59 | Admitting: Gastroenterology

## 2023-07-31 ENCOUNTER — Ambulatory Visit (HOSPITAL_COMMUNITY)
Admission: RE | Admit: 2023-07-31 | Discharge: 2023-07-31 | Disposition: A | Discharge status: Home / Self Care | Payer: 59 | Source: Ambulatory Visit | Attending: Gastroenterology | Admitting: Gastroenterology

## 2023-07-31 ENCOUNTER — Encounter (INDEPENDENT_AMBULATORY_CARE_PROVIDER_SITE_OTHER): Payer: Self-pay | Admitting: Gastroenterology

## 2023-07-31 VITALS — BP 137/95 | HR 69 | Temp 97.3°F | Ht 75.0 in | Wt 276.9 lb

## 2023-07-31 DIAGNOSIS — R7989 Other specified abnormal findings of blood chemistry: Secondary | ICD-10-CM | POA: Diagnosis not present

## 2023-07-31 DIAGNOSIS — R140 Abdominal distension (gaseous): Secondary | ICD-10-CM | POA: Diagnosis not present

## 2023-07-31 DIAGNOSIS — R932 Abnormal findings on diagnostic imaging of liver and biliary tract: Secondary | ICD-10-CM | POA: Diagnosis not present

## 2023-07-31 DIAGNOSIS — K760 Fatty (change of) liver, not elsewhere classified: Secondary | ICD-10-CM | POA: Diagnosis not present

## 2023-07-31 DIAGNOSIS — Z944 Liver transplant status: Secondary | ICD-10-CM | POA: Diagnosis not present

## 2023-07-31 NOTE — H&P (View-Only) (Signed)
Referring Provider: Anabel Halon, MD Primary Care Physician:  Anabel Halon, MD Primary GI Physician: Dr. Levon Hedger   Chief Complaint  Patient presents with   [facility]    Patient here today due to abdominal bloating/ distention. He also has some right sided chest discomfort also.   HPI:   Elijah Shaffer is a 42 y.o. 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 No overt abdominal pain. He is unsure of precipitating factors. Abdomen gets very tight at times. No nausea or vomiting. Sometimes has shortness of breath when abdomen gets very swollen. Denies constipation or diarrhea. Having 2-3 BMs per day. He denies swelling to his legs. No history of liver disease. Notably diffuse hepatic steatosis noted on CT abdomen in April 2023. Liver enzymes elevated since early 2024. AST 79, ALT 94 in July. He does not drink alcohol. Acute hep panel negative. Denies rectal bleeding or melena. He notes belching some but no heartburn or acid reflux. He  notes appetite is good. He does feel like he gets full somewhat earlier at times.   Last EGD: 05/2021 - Esophageal mucosal changes consistent with long-segment Barrett's esophagus, classified as Barrett's stage C5-M6 per Prague criteria (between 30-36 cm). Biopsied - positive for BE but neg for dysplasia. - 9 cm hiatal hernia. - Normal stomach. - Normal examined duodenum.   Recommended to repeat in 3 years.   Last Colonoscopy: 05/2021 -  The perianal and digital rectal examinations were normal. Multiple large-mouthed diverticula were found in the sigmoid colon, transverse colon, ascending colon and cecum. Non-bleeding internal hemorrhoids were found during retroflexion. The   hemorrhoids were small.   Recommended to repeat in 5 years due to family history of cancer.   Past Medical History:  Diagnosis Date   Acute renal injury (HCC) 06/28/2017   Anxiety    Aspiration pneumonia of right upper lobe due to gastric secretions (HCC)    Diverticulitis    GERD (gastroesophageal reflux disease) 01/08/2018   Seasonal allergies    Syncope 06/26/2021    Past Surgical History:  Procedure Laterality Date   BIOPSY  03/18/2021   Procedure: BIOPSY;  Surgeon: Dolores Frame, MD;  Location: [facility];  Service: Gastroenterology;;  esophageal at Z-line   BIOPSY  05/23/2021   Procedure: BIOPSY;  Surgeon: Dolores Frame, MD;  Location: [facility];  Service: Gastroenterology;;   CARDIOPULMONARY EXERCISE TEST (CPX)  01/09/2022   Interpretation limited by submaximal effort.  Severe functional impairment when compared to max sedentary norms.  No cardiopulmonary limitations.  Noted tremor and generalized weakness that lead to premature exercise termination-consider neuromuscular evaluation.   COLONOSCOPY N/A 09/22/2017   Procedure: COLONOSCOPY;  Surgeon: Malissa Hippo, MD;  Location: [facility];  Service: Endoscopy;  Laterality: N/A;  9:55   COLONOSCOPY WITH PROPOFOL N/A 05/23/2021   Procedure: COLONOSCOPY WITH PROPOFOL;  Surgeon: Dolores Frame, MD;  Location: [facility];  Service: Gastroenterology;  Laterality: N/A;  7:30   ESOPHAGOGASTRODUODENOSCOPY (EGD) WITH PROPOFOL N/A 03/18/2021   Procedure: ESOPHAGOGASTRODUODENOSCOPY (EGD) WITH PROPOFOL;  Surgeon: Dolores Frame, MD;  [facility]: [facility];  Service: Gastroenterology;  Laterality: N/A;   ESOPHAGOGASTRODUODENOSCOPY (EGD) WITH PROPOFOL N/A 05/23/2021   Procedure: ESOPHAGOGASTRODUODENOSCOPY (EGD) WITH PROPOFOL;  Surgeon:  Dolores Frame, MD;  Location: [facility];  Service: Gastroenterology;  Laterality: N/A;   POLYPECTOMY  09/22/2017   Procedure:  POLYPECTOMY;  Surgeon: Malissa Hippo, MD;  Location: [facility];  Service: Endoscopy;;   RIGHT/LEFT HEART CATH AND CORONARY ANGIOGRAPHY N/A 12/12/2021   Procedure: RIGHT/LEFT HEART CATH AND CORONARY ANGIOGRAPHY;  Surgeon: Dolores Patty, MD;  Location: [facility];  Service: Cardiovascular:: Normal Coronaries & Hemodynamics.  EF 50-55%. RA = 4 mmHg, RV = 23/4; PA = 22/6 (14) & PCW = 7; Ao sat = 95%; PA sat = 76%, 76%& SVC sat = 74%Fick cardiac output/index = 7.1/3.0; PVR = 1.0 WU   TRANSTHORACIC ECHOCARDIOGRAM  02/24/2021   EF 60 to 65%.  Normal LV function.  Normal valves.  Mild RV dilation with normal function.   ZIO PATCH EVENT MONITOR  01/2022   Sinus rhythm with heart rate range 24 to 159 bpm, 1  AVB and Wenckebach block noted along with rare PACs and PVCs.  No arrhythmias.    Current Outpatient Medications  Medication Sig Dispense Refill   albuterol (VENTOLIN HFA) 108 (90 Base) MCG/ACT inhaler INHALE 2 PUFFS BY MOUTH EVERY 6 HOURS AS NEEDED FOR SHORTNESS OF BREATH 9 g 0   amLODipine (NORVASC) 5 MG tablet Take 1 tablet (5 mg total) by mouth daily. 90 tablet 1   aspirin EC 81 MG tablet Take 81 mg by mouth daily. Swallow whole.     b complex vitamins capsule Take 1 capsule by mouth daily. With D3 gummie     betamethasone dipropionate 0.05 % lotion Apply 1-2 times daily to affected itchy areas at scalp. Avoid applying to face, groin, and axilla. Use as directed. Long-term use can cause thinning of the skin. 60 mL 2   cetirizine (ZYRTEC) 10 MG tablet Take 10 mg by mouth daily.     Cholecalciferol (VITAMIN D-3 PO) Take by mouth daily at 6 (six) AM.     clindamycin (CLEOCIN T) 1 % external solution Apply topically to acne bumps at aa's qd 30 mL 3   clobetasol (TEMOVATE) 0.05 % external solution Apply 1 application. topically 2 (two) times daily. Use as directed.  Mix with cerave cream. Use for up to 4 weeks. Avoid applying to face, groin, and axilla. Use as directed. 50 mL 1    clobetasol cream (TEMOVATE) 0.05 % Apply small amount daily before bed to right ankle or other areas of body as needed for itchy rash Avoid applying to face, groin, and axilla Use as directed. 30 g 0   cyclobenzaprine (FLEXERIL) 5 MG tablet Take 1 tablet (5 mg total) by mouth at bedtime as needed for muscle spasms. 30 tablet 1   diphenhydrAMINE-zinc acetate (BENADRYL EXTRA STRENGTH) cream Apply 1 application topically 3 (three) times daily as needed for itching. 28.4 g 0   docusate sodium (COLACE) 100 MG capsule Take 100 mg by mouth 2 (two) times daily.     DULoxetine (CYMBALTA) 30 MG capsule Take 60 mg by mouth daily.     dupilumab (DUPIXENT) 300 MG/2ML prefilled syringe Inject 300 mg into the skin every 14 (fourteen) days. Starting at day 15 for maintenance. 4 mL 5   fenofibrate (TRICOR) 145 MG tablet Take 1 tablet (145 mg total) by mouth daily. 90 tablet 3   Fluocinolone Acetonide 0.01 % OIL Use 1 - 2 drop qd/bid prn for scale at ears 20 mL 2   fluocinonide (LIDEX) 0.05 % external solution Apply once  or twice daily to affected areas on scalp up to 2 weeks as needed for itching. Avoid applying to face, groin, and axilla. 60 mL 2   fluticasone (FLONASE ALLERGY RELIEF) 50 MCG/ACT nasal spray Place 2 sprays into both nostrils daily as needed for allergies or rhinitis. (Patient taking differently: Place 2 sprays into both nostrils daily.) 9.9 mL 2   gabapentin (NEURONTIN) 300 MG capsule Take 300 mg by mouth 3 (three) times daily.     guaiFENesin (MUCINEX) 600 MG 12 hr tablet Take by mouth.     hydrOXYzine (VISTARIL) 25 MG capsule Take 1 capsule (25 mg total) by mouth every 8 (eight) hours as needed. Take before bed as needed for itchy. Can cause drowsiness. 60 capsule 2   ketoconazole (NIZORAL) 2 % shampoo Apply 1 Application topically daily as needed for irritation. 120 mL 11   montelukast (SINGULAIR) 10 MG tablet TAKE 1 TABLET BY MOUTH AT BEDTIME 30 tablet 0   naproxen (NAPROSYN) 500 MG tablet Take  500 mg by mouth 2 (two) times daily.     NEEDLE, DISP, 18 G 18G X 1-1/2" MISC Use to draw up testosterone for injection 2 each 12   omega-3 acid ethyl esters (LOVAZA) 1 g capsule Take 2 capsules (2 g total) by mouth 2 (two) times daily. 120 capsule 3   omeprazole (PRILOSEC) 40 MG capsule Take 1 capsule by mouth twice daily 180 capsule 0   ondansetron (ZOFRAN) 4 MG tablet Take 1 tablet (4 mg total) by mouth every 6 (six) hours as needed for nausea. 20 tablet 0   potassium gluconate (RA POTASSIUM GLUCONATE) 595 (99 K) MG TABS tablet Take 1 tablet by mouth daily.     propranolol (INDERAL) 20 MG tablet Take one tablet 20 mg  twice daily  may take an  additional dose of 20 mg  as needed for palpitations 190 tablet 3   rosuvastatin (CRESTOR) 40 MG tablet Take 1 tablet (40 mg total) by mouth daily. 90 tablet 3   senna (SENOKOT) 8.6 MG tablet Take 1 tablet by mouth daily. prn     SYRINGE-NEEDLE, DISP, 3 ML 22G X 1-1/2" 3 ML MISC Use to inject testosterone 2 each 12   tacrolimus (PROTOPIC) 0.1 % ointment APPLY TOPICALLY TO AFFECTED AREAS OF BODY FOR RASH DAILY OR TWICE DAILY AS NEEDED 30 g 0   Testosterone 25 MG/2.5GM (1%) GEL Place 2.5 g onto the skin in the morning. 75 g 5   triamcinolone cream (KENALOG) 0.1 %      Upadacitinib ER (RINVOQ) 15 MG TB24 Take 1 tablet (15 mg total) by mouth daily. 30 tablet 5   No current facility-administered medications for this visit.    Allergies as of 07/31/2023 - Review Complete 07/31/2023  Allergen Reaction Noted   Gramineae pollens Itching 05/10/2022   Claritin [loratadine] Other (See Comments) 02/08/2017   Loratadine-pseudoephedrine er Palpitations 01/30/2022   Pollen extract Other (See Comments) 06/26/2021    Family History  Problem Relation Age of Onset   Healthy Mother    Cervical cancer Mother    Cancer Father    Colon cancer Maternal Grandmother    Colon cancer Maternal Grandfather     Social History   Socioeconomic History   Marital status:  Single    Spouse name: Not on file   Number of children: Not on file   Years of education: Not on file   Highest education level: Not on file  Occupational History   Not on  file  Tobacco Use   Smoking status: Never    Passive exposure: Past   Smokeless tobacco: Never  Vaping Use   Vaping status: Never Used  Substance and Sexual Activity   Alcohol use: Yes    Comment: occ   Drug use: No   Sexual activity: Not on file  Other Topics Concern   Not on file  Social History Narrative   He currently has his own lawn care landscaping service. He previous had 80 yards before he got sick and since decreased. He works around a Interior and spatial designer. One of the chemical names is roundup.    Social Determinants of Health   Financial Resource Strain: Not on file  Food Insecurity: No Food Insecurity (06/20/2023)   Hunger Vital Sign    Worried About Running Out of Food in the Last Year: Never true    Ran Out of Food in the Last Year: Never true  Transportation Needs: No Transportation Needs (12/27/2022)   PRAPARE - Administrator, Civil Service (Medical): No    Lack of Transportation (Non-Medical): No  Physical Activity: Insufficiently Active (03/29/2022)   Exercise Vital Sign    Days of Exercise per Week: 2 days    Minutes of Exercise per Session: 10 min  Stress: Stress Concern Present (05/07/2022)   [employer] of Occupational Health - Occupational Stress Questionnaire    Feeling of Stress : To some extent  Social Connections: Not on file    Review of systems General: negative for malaise, night sweats, fever, chills, weight loss Neck: Negative for lumps, goiter, pain and significant neck swelling Resp: Negative for cough, wheezing, dyspnea at rest CV: Negative for chest pain, leg swelling, palpitations, orthopnea GI: denies melena, hematochezia, nausea, vomiting, diarrhea, constipation, dysphagia, odyonophagia, early satiety or unintentional weight loss. +abdominal  bloating/distention  MSK: Negative for joint pain or swelling, back pain, and muscle pain. Derm: Negative for itching or rash Psych: Denies depression, anxiety, memory loss, confusion. No homicidal or suicidal ideation.  Heme: Negative for prolonged bleeding, bruising easily, and swollen nodes. Endocrine: Negative for cold or heat intolerance, polyuria, polydipsia and goiter. Neuro: negative for tremor, gait imbalance, syncope and seizures. The remainder of the review of systems is noncontributory.  Physical Exam: BP (!) 137/95 (BP Location: Left Arm, Patient Position: Sitting, Cuff Size: Large)   Pulse 69   Temp (!) 97.3 F (36.3 C) (Temporal)   Ht 6\' 3"  (1.905 m)   Wt 276 lb 14.4 oz (125.6 kg)   BMI 34.61 kg/m  General:   Alert and oriented. No distress noted. Pleasant and cooperative.  Head:  Normocephalic and atraumatic. Eyes:  Conjuctiva clear without scleral icterus. Mouth:  Oral mucosa pink and moist. Good dentition. No lesions. Heart: Normal rate and rhythm, s1 and s2 heart sounds present.  Lungs: Clear lung sounds in all lobes. Respirations equal and unlabored. Abdomen:  +BS, non-tender, abdomen is distended and full but soft. No rebound or guarding. No HSM or masses noted. Derm: No palmar erythema or jaundice Msk:  Symmetrical without gross deformities. Normal posture. Extremities:  Without edema. Neurologic:  Alert and  oriented x4 Psych:  Alert and cooperative. Normal mood and affect.  Invalid input(s): "6 MONTHS"   ASSESSMENT: Elijah Shaffer is a 42 y.o. male presenting today for abdominal bloating/distention.   Presenting with abdominal bloating, denies associated pain, nausea, vomiting, or constipation, does note some SOB and chest pressure at times, especially when bending over. Endorses some mild  early satiety. Exam is concerning for possible ascites given his distention. Very Low suspicion for SBO given no nausea, vomiting, pain or constipation.  LFTs have been  elevated, findings of diffuse hepatic steatosis on previous imaging, no recent liver imaging. Will obtain US liver elastography to evaluate for underlying cirrhosis, ascites and obtain further serologies to rule out causes of elevated LFTs to include AMA, ASMA, ANA, A1A, ceruloplasmin, Iron studies and will update CMP.   PLAN:  US liver elastography 2. CMP, IRon studies and liver serologies  3. Further recommendations to follow pending the above workup  All questions were answered, patient verbalized understanding and is in agreement with plan as outlined above.    Follow Up: 3 months   Brittne Kawasaki L. Jeanmarie Hubert, MSN, APRN, AGNP-C Adult-Gerontology Nurse Practitioner [facility]  I have reviewed the note and agree with the APP's assessment as described in this progress note  Katrinka Blazing, MD Gastroenterology and Hepatology [facility]astroenter[facility]

## 2023-07-31 NOTE — Patient Instructions (Addendum)
We will schedule an [place] of your abdomen and do some further labs given your liver enzymes are elevated as I am concerned this may be contributing to your abdominal swelling  Follow up 3 months

## 2023-07-31 NOTE — Progress Notes (Signed)
Referring Provider: Anabel Halon, MD Primary Care Physician:  Anabel Halon, MD Primary GI Physician: Dr. Levon Hedger   Chief Complaint  Patient presents with   [facility]    Patient here today due to abdominal bloating/ distention. He also has some right sided chest discomfort also.   HPI:   Elijah Shaffer is a 42 y.o. 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 No overt abdominal pain. He is unsure of precipitating factors. Abdomen gets very tight at times. No nausea or vomiting. Sometimes has shortness of breath when abdomen gets very swollen. Denies constipation or diarrhea. Having 2-3 BMs per day. He denies swelling to his legs. No history of liver disease. Notably diffuse hepatic steatosis noted on CT abdomen in April 2023. Liver enzymes elevated since early 2024. AST 79, ALT 94 in July. He does not drink alcohol. Acute hep panel negative. Denies rectal bleeding or melena. He notes belching some but no heartburn or acid reflux. He  notes appetite is good. He does feel like he gets full somewhat earlier at times.   Last EGD: 05/2021 - Esophageal mucosal changes consistent with long-segment Barrett's esophagus, classified as Barrett's stage C5-M6 per Prague criteria (between 30-36 cm). Biopsied - positive for BE but neg for dysplasia. - 9 cm hiatal hernia. - Normal stomach. - Normal examined duodenum.   Recommended to repeat in 3 years.   Last Colonoscopy: 05/2021 -  The perianal and digital rectal examinations were normal. Multiple large-mouthed diverticula were found in the sigmoid colon, transverse colon, ascending colon and cecum. Non-bleeding internal hemorrhoids were found during retroflexion. The   hemorrhoids were small.   Recommended to repeat in 5 years due to family history of cancer.   Past Medical History:  Diagnosis Date   Acute renal injury (HCC) 06/28/2017   Anxiety    Aspiration pneumonia of right upper lobe due to gastric secretions (HCC)    Diverticulitis    GERD (gastroesophageal reflux disease) 01/08/2018   Seasonal allergies    Syncope 06/26/2021    Past Surgical History:  Procedure Laterality Date   BIOPSY  03/18/2021   Procedure: BIOPSY;  Surgeon: Dolores Frame, MD;  Location: [facility];  Service: Gastroenterology;;  esophageal at Z-line   BIOPSY  05/23/2021   Procedure: BIOPSY;  Surgeon: Dolores Frame, MD;  Location: [facility];  Service: Gastroenterology;;   CARDIOPULMONARY EXERCISE TEST (CPX)  01/09/2022   Interpretation limited by submaximal effort.  Severe functional impairment when compared to max sedentary norms.  No cardiopulmonary limitations.  Noted tremor and generalized weakness that lead to premature exercise termination-consider neuromuscular evaluation.   COLONOSCOPY N/A 09/22/2017   Procedure: COLONOSCOPY;  Surgeon: Malissa Hippo, MD;  Location: [facility];  Service: Endoscopy;  Laterality: N/A;  9:55   COLONOSCOPY WITH PROPOFOL N/A 05/23/2021   Procedure: COLONOSCOPY WITH PROPOFOL;  Surgeon: Dolores Frame, MD;  Location: [facility];  Service: Gastroenterology;  Laterality: N/A;  7:30   ESOPHAGOGASTRODUODENOSCOPY (EGD) WITH PROPOFOL N/A 03/18/2021   Procedure: ESOPHAGOGASTRODUODENOSCOPY (EGD) WITH PROPOFOL;  Surgeon: Dolores Frame, MD;  Location: [facility];  Service: Gastroenterology;  Laterality: N/A;   ESOPHAGOGASTRODUODENOSCOPY (EGD) WITH PROPOFOL N/A 05/23/2021   Procedure: ESOPHAGOGASTRODUODENOSCOPY (EGD) WITH PROPOFOL;  Surgeon:  Dolores Frame, MD;  Location: [facility];  Service: Gastroenterology;  Laterality: N/A;   POLYPECTOMY  09/22/2017   Procedure:  POLYPECTOMY;  Surgeon: Malissa Hippo, MD;  Location: [facility];  Service: Endoscopy;;   RIGHT/LEFT HEART CATH AND CORONARY ANGIOGRAPHY N/A 12/12/2021   Procedure: RIGHT/LEFT HEART CATH AND CORONARY ANGIOGRAPHY;  Surgeon: Dolores Patty, MD;  Location: [facility];  Service: Cardiovascular:: Normal Coronaries & Hemodynamics.  EF 50-55%. RA = 4 mmHg, RV = 23/4; PA = 22/6 (14) & PCW = 7; Ao sat = 95%; PA sat = 76%, 76%& SVC sat = 74%Fick cardiac output/index = 7.1/3.0; PVR = 1.0 WU   TRANSTHORACIC ECHOCARDIOGRAM  02/24/2021   EF 60 to 65%.  Normal LV function.  Normal valves.  Mild RV dilation with normal function.   ZIO PATCH EVENT MONITOR  01/2022   Sinus rhythm with heart rate range 24 to 159 bpm, 1  AVB and Wenckebach block noted along with rare PACs and PVCs.  No arrhythmias.    Current Outpatient Medications  Medication Sig Dispense Refill   albuterol (VENTOLIN HFA) 108 (90 Base) MCG/ACT inhaler INHALE 2 PUFFS BY MOUTH EVERY 6 HOURS AS NEEDED FOR SHORTNESS OF BREATH 9 g 0   amLODipine (NORVASC) 5 MG tablet Take 1 tablet (5 mg total) by mouth daily. 90 tablet 1   aspirin EC 81 MG tablet Take 81 mg by mouth daily. Swallow whole.     b complex vitamins capsule Take 1 capsule by mouth daily. With D3 gummie     betamethasone dipropionate 0.05 % lotion Apply 1-2 times daily to affected itchy areas at scalp. Avoid applying to face, groin, and axilla. Use as directed. Long-term use can cause thinning of the skin. 60 mL 2   cetirizine (ZYRTEC) 10 MG tablet Take 10 mg by mouth daily.     Cholecalciferol (VITAMIN D-3 PO) Take by mouth daily at 6 (six) AM.     clindamycin (CLEOCIN T) 1 % external solution Apply topically to acne bumps at aa's qd 30 mL 3   clobetasol (TEMOVATE) 0.05 % external solution Apply 1 application. topically 2 (two) times daily. Use as directed.  Mix with cerave cream. Use for up to 4 weeks. Avoid applying to face, groin, and axilla. Use as directed. 50 mL 1    clobetasol cream (TEMOVATE) 0.05 % Apply small amount daily before bed to right ankle or other areas of body as needed for itchy rash Avoid applying to face, groin, and axilla Use as directed. 30 g 0   cyclobenzaprine (FLEXERIL) 5 MG tablet Take 1 tablet (5 mg total) by mouth at bedtime as needed for muscle spasms. 30 tablet 1   diphenhydrAMINE-zinc acetate (BENADRYL EXTRA STRENGTH) cream Apply 1 application topically 3 (three) times daily as needed for itching. 28.4 g 0   docusate sodium (COLACE) 100 MG capsule Take 100 mg by mouth 2 (two) times daily.     DULoxetine (CYMBALTA) 30 MG capsule Take 60 mg by mouth daily.     dupilumab (DUPIXENT) 300 MG/2ML prefilled syringe Inject 300 mg into the skin every 14 (fourteen) days. Starting at day 15 for maintenance. 4 mL 5   fenofibrate (TRICOR) 145 MG tablet Take 1 tablet (145 mg total) by mouth daily. 90 tablet 3   Fluocinolone Acetonide 0.01 % OIL Use 1 - 2 drop qd/bid prn for scale at ears 20 mL 2   fluocinonide (LIDEX) 0.05 % external solution Apply once  or twice daily to affected areas on scalp up to 2 weeks as needed for itching. Avoid applying to face, groin, and axilla. 60 mL 2   fluticasone (FLONASE ALLERGY RELIEF) 50 MCG/ACT nasal spray Place 2 sprays into both nostrils daily as needed for allergies or rhinitis. (Patient taking differently: Place 2 sprays into both nostrils daily.) 9.9 mL 2   gabapentin (NEURONTIN) 300 MG capsule Take 300 mg by mouth 3 (three) times daily.     guaiFENesin (MUCINEX) 600 MG 12 hr tablet Take by mouth.     hydrOXYzine (VISTARIL) 25 MG capsule Take 1 capsule (25 mg total) by mouth every 8 (eight) hours as needed. Take before bed as needed for itchy. Can cause drowsiness. 60 capsule 2   ketoconazole (NIZORAL) 2 % shampoo Apply 1 Application topically daily as needed for irritation. 120 mL 11   montelukast (SINGULAIR) 10 MG tablet TAKE 1 TABLET BY MOUTH AT BEDTIME 30 tablet 0   naproxen (NAPROSYN) 500 MG tablet Take  500 mg by mouth 2 (two) times daily.     NEEDLE, DISP, 18 G 18G X 1-1/2" MISC Use to draw up testosterone for injection 2 each 12   omega-3 acid ethyl esters (LOVAZA) 1 g capsule Take 2 capsules (2 g total) by mouth 2 (two) times daily. 120 capsule 3   omeprazole (PRILOSEC) 40 MG capsule Take 1 capsule by mouth twice daily 180 capsule 0   ondansetron (ZOFRAN) 4 MG tablet Take 1 tablet (4 mg total) by mouth every 6 (six) hours as needed for nausea. 20 tablet 0   potassium gluconate (RA POTASSIUM GLUCONATE) 595 (99 K) MG TABS tablet Take 1 tablet by mouth daily.     propranolol (INDERAL) 20 MG tablet Take one tablet 20 mg  twice daily  may take an  additional dose of 20 mg  as needed for palpitations 190 tablet 3   rosuvastatin (CRESTOR) 40 MG tablet Take 1 tablet (40 mg total) by mouth daily. 90 tablet 3   senna (SENOKOT) 8.6 MG tablet Take 1 tablet by mouth daily. prn     SYRINGE-NEEDLE, DISP, 3 ML 22G X 1-1/2" 3 ML MISC Use to inject testosterone 2 each 12   tacrolimus (PROTOPIC) 0.1 % ointment APPLY TOPICALLY TO AFFECTED AREAS OF BODY FOR RASH DAILY OR TWICE DAILY AS NEEDED 30 g 0   Testosterone 25 MG/2.5GM (1%) GEL Place 2.5 g onto the skin in the morning. 75 g 5   triamcinolone cream (KENALOG) 0.1 %      Upadacitinib ER (RINVOQ) 15 MG TB24 Take 1 tablet (15 mg total) by mouth daily. 30 tablet 5   No current facility-administered medications for this visit.    Allergies as of 07/31/2023 - Review Complete 07/31/2023  Allergen Reaction Noted   Gramineae pollens Itching 05/10/2022   Claritin [loratadine] Other (See Comments) 02/08/2017   Loratadine-pseudoephedrine er Palpitations 01/30/2022   Pollen extract Other (See Comments) 06/26/2021    Family History  Problem Relation Age of Onset   Healthy Mother    Cervical cancer Mother    Cancer Father    Colon cancer Maternal Grandmother    Colon cancer Maternal Grandfather     Social History   Socioeconomic History   Marital status:  Single    Spouse name: Not on file   Number of children: Not on file   Years of education: Not on file   Highest education level: Not on file  Occupational History   Not on  file  Tobacco Use   Smoking status: Never    Passive exposure: Past   Smokeless tobacco: Never  Vaping Use   Vaping status: Never Used  Substance and Sexual Activity   Alcohol use: Yes    Comment: occ   Drug use: No   Sexual activity: Not on file  Other Topics Concern   Not on file  Social History Narrative   He currently has his own lawn care landscaping service. He previous had 80 yards before he got sick and since decreased. He works around a Interior and spatial designer. One of the chemical names is roundup.    Social Determinants of Health   Financial Resource Strain: Not on file  Food Insecurity: No Food Insecurity (06/20/2023)   Hunger Vital Sign    Worried About Running Out of Food in the Last Year: Never true    Ran Out of Food in the Last Year: Never true  Transportation Needs: No Transportation Needs (12/27/2022)   PRAPARE - Administrator, Civil Service (Medical): No    Lack of Transportation (Non-Medical): No  Physical Activity: Insufficiently Active (03/29/2022)   Exercise Vital Sign    Days of Exercise per Week: 2 days    Minutes of Exercise per Session: 10 min  Stress: Stress Concern Present (05/07/2022)   [employer] of Occupational Health - Occupational Stress Questionnaire    Feeling of Stress : To some extent  Social Connections: Not on file    Review of systems General: negative for malaise, night sweats, fever, chills, weight loss Neck: Negative for lumps, goiter, pain and significant neck swelling Resp: Negative for cough, wheezing, dyspnea at rest CV: Negative for chest pain, leg swelling, palpitations, orthopnea GI: denies melena, hematochezia, nausea, vomiting, diarrhea, constipation, dysphagia, odyonophagia, early satiety or unintentional weight loss. +abdominal  bloating/distention  MSK: Negative for joint pain or swelling, back pain, and muscle pain. Derm: Negative for itching or rash Psych: Denies depression, anxiety, memory loss, confusion. No homicidal or suicidal ideation.  Heme: Negative for prolonged bleeding, bruising easily, and swollen nodes. Endocrine: Negative for cold or heat intolerance, polyuria, polydipsia and goiter. Neuro: negative for tremor, gait imbalance, syncope and seizures. The remainder of the review of systems is noncontributory.  Physical Exam: BP (!) 137/95 (BP Location: Left Arm, Patient Position: Sitting, Cuff Size: Large)   Pulse 69   Temp (!) 97.3 F (36.3 C) (Temporal)   Ht 6\' 3"  (1.905 m)   Wt 276 lb 14.4 oz (125.6 kg)   BMI 34.61 kg/m  General:   Alert and oriented. No distress noted. Pleasant and cooperative.  Head:  Normocephalic and atraumatic. Eyes:  Conjuctiva clear without scleral icterus. Mouth:  Oral mucosa pink and moist. Good dentition. No lesions. Heart: Normal rate and rhythm, s1 and s2 heart sounds present.  Lungs: Clear lung sounds in all lobes. Respirations equal and unlabored. Abdomen:  +BS, non-tender, abdomen is distended and full but soft. No rebound or guarding. No HSM or masses noted. Derm: No palmar erythema or jaundice Msk:  Symmetrical without gross deformities. Normal posture. Extremities:  Without edema. Neurologic:  Alert and  oriented x4 Psych:  Alert and cooperative. Normal mood and affect.  Invalid input(s): "6 MONTHS"   ASSESSMENT: Elijah Shaffer is a 42 y.o. male presenting today for abdominal bloating/distention.   Presenting with abdominal bloating, denies associated pain, nausea, vomiting, or constipation, does note some SOB and chest pressure at times, especially when bending over. Endorses some mild  early satiety. Exam is concerning for possible ascites given his distention. Very Low suspicion for SBO given no nausea, vomiting, pain or constipation.  LFTs have been  elevated, findings of diffuse hepatic steatosis on previous imaging, no recent liver imaging. Will obtain US liver elastography to evaluate for underlying cirrhosis, ascites and obtain further serologies to rule out causes of elevated LFTs to include AMA, ASMA, ANA, A1A, ceruloplasmin, Iron studies and will update CMP.   PLAN:  US liver elastography 2. CMP, IRon studies and liver serologies  3. Further recommendations to follow pending the above workup  All questions were answered, patient verbalized understanding and is in agreement with plan as outlined above.    Follow Up: 3 months   Nickalus Thornsberry L. Jeanmarie Hubert, MSN, APRN, AGNP-C Adult-Gerontology Nurse Practitioner [facility]  I have reviewed the note and agree with the APP's assessment as described in this progress note  Katrinka Blazing, MD Gastroenterology and Hepatology [facility]

## 2023-08-01 ENCOUNTER — Encounter: Payer: Self-pay | Admitting: Oncology

## 2023-08-01 ENCOUNTER — Encounter (INDEPENDENT_AMBULATORY_CARE_PROVIDER_SITE_OTHER): Payer: Self-pay

## 2023-08-01 ENCOUNTER — Inpatient Hospital Stay: Payer: 59 | Admitting: Oncology

## 2023-08-01 ENCOUNTER — Ambulatory Visit (HOSPITAL_COMMUNITY)
Admission: RE | Admit: 2023-08-01 | Discharge: 2023-08-01 | Disposition: A | Discharge status: Home / Self Care | Payer: 59 | Source: Ambulatory Visit | Attending: Oncology | Admitting: Oncology

## 2023-08-01 ENCOUNTER — Other Ambulatory Visit (INDEPENDENT_AMBULATORY_CARE_PROVIDER_SITE_OTHER): Payer: Self-pay | Admitting: Gastroenterology

## 2023-08-01 ENCOUNTER — Ambulatory Visit (HOSPITAL_COMMUNITY)
Admission: RE | Admit: 2023-08-01 | Discharge: 2023-08-01 | Disposition: A | Discharge status: Home / Self Care | Payer: 59 | Source: Ambulatory Visit | Attending: Gastroenterology | Admitting: Gastroenterology

## 2023-08-01 VITALS — BP 128/98 | HR 64 | Temp 98.1°F | Resp 16 | Wt 276.5 lb

## 2023-08-01 DIAGNOSIS — D751 Secondary polycythemia: Secondary | ICD-10-CM

## 2023-08-01 DIAGNOSIS — K449 Diaphragmatic hernia without obstruction or gangrene: Secondary | ICD-10-CM | POA: Diagnosis not present

## 2023-08-01 DIAGNOSIS — R748 Abnormal levels of other serum enzymes: Secondary | ICD-10-CM | POA: Diagnosis not present

## 2023-08-01 DIAGNOSIS — R140 Abdominal distension (gaseous): Secondary | ICD-10-CM

## 2023-08-01 DIAGNOSIS — R29818 Other symptoms and signs involving the nervous system: Secondary | ICD-10-CM

## 2023-08-01 DIAGNOSIS — R079 Chest pain, unspecified: Secondary | ICD-10-CM | POA: Insufficient documentation

## 2023-08-01 DIAGNOSIS — N1832 Chronic kidney disease, stage 3b: Secondary | ICD-10-CM | POA: Diagnosis not present

## 2023-08-01 DIAGNOSIS — R109 Unspecified abdominal pain: Secondary | ICD-10-CM | POA: Diagnosis not present

## 2023-08-01 DIAGNOSIS — K5730 Diverticulosis of large intestine without perforation or abscess without bleeding: Secondary | ICD-10-CM | POA: Diagnosis not present

## 2023-08-01 MED ORDER — IOHEXOL 300 MG/ML  SOLN
100.0000 mL | Freq: Once | INTRAMUSCULAR | Status: AC | PRN
  Administered 2023-08-01: 100 mL via INTRAVENOUS

## 2023-08-01 NOTE — Progress Notes (Unsigned)
[facility] [address], [place] [address]   CLINIC:  Medical Oncology/Hematology  PCP:  Elijah Halon, MD [address] [place] [address] [phone]   REASON FOR VISIT:  Follow-up for erythrocytosis  CURRENT THERAPY:   INTERVAL HISTORY:   [name] 42 y.o. male returns for routine follow-up of erythrocytosis.  He was seen by Elijah Shaffer on 01/23/2023.  Patient had hematology workup on 12/27/2022 that did not reveal any mutations for JAK2, CALR, MPL, exon 12-15.  Erythrocytosis was thought to be secondary to noncompliance with CPAP in the setting of sleep apnea.  He was encouraged to use his CPAP more frequently.  At today's visit, he reports doing fairly well.  Appetite is 100% energy levels are 50 to 60%.  Denies any pain.  He has been having some abdominal bloating and was seen by his GI doctor yesterday.  Has history of fatty liver and Nash cirrhosis.  Appetite is 100% energy levels are 50 to 60%.  Denies any pain.  At today's visit, he reports feeling fairly well.  Although he has some general itchiness from his dermatitis, he denies any aquagenic pruritus.  No vasomotor symptoms or B symptoms.  His hemoglobin has normalized since his last visit, and he reports that he has been using his CPAP nightly for the past few weeks.  He does also report an episode of isolated rectal bleeding a few weeks ago.  He denies any obvious exposure to carbon monoxide.  He has 80% energy and 100% appetite. He endorses that he is maintaining a stable weight.  ASSESSMENT & PLAN:  1. Erythrocytosis -Mild erythrocytosis first noted in January 2024 with CBC/D (12/02/2022) showing Hgb 18.1/HCT 52.5%.  Repeat CBC/D (12/23/2022) showed Hgb 18.2/HCT 53.8%. - CTAP (02/23/2021) showed normal-appearing spleen - Non-smoker.   He denies any sources of carbon monoxide exposure.    - No prior history of thrombosis. - He is not on testosterone supplementation.  He does  not take any diuretics. - No aquagenic pruritus, vasomotor symptoms, or B symptoms. - Patient has obstructive sleep apnea, but had had not been wearing his CPAP for 2 to 3 months prior to onset of his erythrocytosis.  Since that time, he has had new mask fitting and is using his CPAP nightly for the past 2 to 3 weeks. - Hematology workup (12/27/2022): Negative for mutations of JAK2, CALR, MPL, Exons 12-15 Normal LDH, normal erythropoietin 11.6 - CBC (12/27/2022): Normal Hgb 16.1/hematocrit 47.6 -CBC from 07/31/2023 shows a hemoglobin of 16.9 and hematocrit of 50.0. -Continue compliance with CPAP. -There is no indication for therapeutic phlebotomy unless severely symptomatic or hematocrit greater than 54%.  2. Elevated liver enzymes -Has history of Nash.  Followed by Elijah Grain, NP. -07/31/2023 AST 45 and ALT 79. -Had abdominal ultrasound on 07/31/2023 showed diffuse hepatic steatosis.  No overt morphologic changes of cirrhosis.  Trace perihepatic ascites.  Gallbladder sludge.  3. Stage 3b chronic kidney disease (HCC) -Creatinine 1.75 on 07/31/2023. -Baseline creatinine has ranged from normal to as high as 2.18 since 2018.  4. Focal neurological deficit -Followed by neurology.   - We will recheck labs and see patient for follow-up visit in 6 months.  If hemoglobin remains normal over the next 6 to 12 months, would consider discharge to PCP. - Would consider bone marrow biopsy if unexplained elevations in hemoglobin or expansion to other cell lines  2.  Other history - Following with neurology for tremors - Follows  with dermatology for atopic dermatitis, receiving Dupixent injections - Sleep apnea, noncompliant with CPAP - History of upper GI bleeding/hematemesis from Elijah Shaffer lesion in his stomach (EGD 03/18/2021) - Other PMH: GERD complicated by Barrett's esophagus, anxiety, chronic dyspnea - SOCIAL:  He has lawn care and landscaping business.  He is a non-smoker.  Exposure to Roundup  present. - FAMILY: No family history of polycythemia vera.  Mother had cervical cancer.  Maternal grandmother and maternal grandfather had cancers.  Father had cancer.  PLAN SUMMARY: >> Labs (CBC/D) in 6 months >> OFFICE visit after labs     REVIEW OF SYSTEMS:   Review of Systems  Constitutional:  Negative for appetite change, chills, diaphoresis, fatigue, fever and unexpected weight change.  HENT:   Negative for lump/mass and nosebleeds.   Eyes:  Negative for eye problems.  Respiratory:  Negative for cough, hemoptysis and shortness of breath.   Cardiovascular:  Negative for chest pain, leg swelling and palpitations.  Gastrointestinal:  Negative for abdominal pain, blood in stool, constipation, diarrhea, nausea and vomiting.  Genitourinary:  Negative for hematuria.   Skin: Negative.   Neurological:  Negative for dizziness, headaches and light-headedness.  Hematological:  Does not bruise/bleed easily.     PHYSICAL EXAM:  ECOG PERFORMANCE STATUS: 0 - Asymptomatic  There were no vitals filed for this visit. There were no vitals filed for this visit. Physical Exam Constitutional:      Appearance: Normal appearance. He is obese.  Cardiovascular:     Heart sounds: Normal heart sounds.  Pulmonary:     Breath sounds: Normal breath sounds.  Neurological:     General: No focal deficit present.     Mental Status: Mental status is at baseline.  Psychiatric:        Behavior: Behavior normal. Behavior is cooperative.   PAST MEDICAL/SURGICAL HISTORY:  Past Medical History:  Diagnosis Date  . Acute renal injury (HCC) 06/28/2017  . Anxiety   . Aspiration pneumonia of right upper lobe due to gastric secretions (HCC)   . Diverticulitis   . GERD (gastroesophageal reflux disease) 01/08/2018  . Seasonal allergies   . Syncope 06/26/2021   Past Surgical History:  Procedure Laterality Date  . BIOPSY  03/18/2021   Procedure: BIOPSY;  Surgeon: Marguerita Merles, Reuel Boom, MD;  Location: [facility];  Service: Gastroenterology;;  esophageal at Z-line  . BIOPSY  05/23/2021   Procedure: BIOPSY;  Surgeon: Marguerita Merles, Reuel Boom, MD;  Location: [facility];  Service: Gastroenterology;;  . CARDIOPULMONARY EXERCISE TEST (CPX)  01/09/2022   Interpretation limited by submaximal effort.  Severe functional impairment when compared to max sedentary norms.  No cardiopulmonary limitations.  Noted tremor and generalized weakness that lead to premature exercise termination-consider neuromuscular evaluation.  . COLONOSCOPY N/A 09/22/2017   Procedure: COLONOSCOPY;  Surgeon: Malissa Hippo, MD;  Location: [facility];  Service: Endoscopy;  Laterality: N/A;  9:55  . COLONOSCOPY WITH PROPOFOL N/A 05/23/2021   Procedure: COLONOSCOPY WITH PROPOFOL;  Surgeon: Dolores Frame, MD;  Location: [facility];  Service: Gastroenterology;  Laterality: N/A;  7:30  . ESOPHAGOGASTRODUODENOSCOPY (EGD) WITH PROPOFOL N/A 03/18/2021   Procedure: ESOPHAGOGASTRODUODENOSCOPY (EGD) WITH PROPOFOL;  Surgeon: Dolores Frame, MD;  Location: [facility];  Service: Gastroenterology;  Laterality: N/A;  . ESOPHAGOGASTRODUODENOSCOPY (EGD) WITH PROPOFOL N/A 05/23/2021   Procedure: ESOPHAGOGASTRODUODENOSCOPY (EGD) WITH PROPOFOL;  Surgeon: Dolores Frame, MD;  Location: [facility];  Service: Gastroenterology;  Laterality: N/A;  . POLYPECTOMY  09/22/2017   Procedure: POLYPECTOMY;  Surgeon: Malissa Hippo, MD;  Location: [facility];  Service: Endoscopy;;  . RIGHT/LEFT HEART CATH AND CORONARY ANGIOGRAPHY N/A 12/12/2021   Procedure: RIGHT/LEFT HEART CATH AND CORONARY ANGIOGRAPHY;  Surgeon: Dolores Patty, MD;  Location: [facility];  Service: Cardiovascular:: Normal Coronaries & Hemodynamics.  EF 50-55%. RA = 4 mmHg, RV = 23/4; PA = 22/6 (14) & PCW = 7; Ao sat = 95%; PA sat = 76%, 76%& SVC sat = 74%Fick cardiac output/index = 7.1/3.0; PVR = 1.0 WU  . TRANSTHORACIC ECHOCARDIOGRAM   02/24/2021   EF 60 to 65%.  Normal LV function.  Normal valves.  Mild RV dilation with normal function.  [name] ZIO PATCH EVENT MONITOR  01/2022   Sinus rhythm with heart rate range 24 to 159 bpm, 1  AVB and Wenckebach block noted along with rare PACs and PVCs.  No arrhythmias.    SOCIAL HISTORY:  Social History   Socioeconomic History  . Marital status: Single    Spouse name: Not on file  . Number of children: Not on file  . Years of education: Not on file  . Highest education level: Not on file  Occupational History  . Not on file  Tobacco Use  . Smoking status: Never    Passive exposure: Past  . Smokeless tobacco: Never  Vaping Use  . Vaping status: Never Used  Substance and Sexual Activity  . Alcohol use: Yes    Comment: occ  . Drug use: No  . Sexual activity: Not on file  Other Topics Concern  . Not on file  Social History Narrative   He currently has his own Barista. He previous had 80 yards before he got sick and since decreased. He works around a Interior and spatial designer. One of the chemical names is roundup.    Social Determinants of Health   Financial Resource Strain: Not on file  Food Insecurity: No Food Insecurity (06/20/2023)   Hunger Vital Sign   . Worried About Programme researcher, broadcasting/film/video in the Last Year: Never true   . Ran Out of Food in the Last Year: Never true  Transportation Needs: No Transportation Needs (12/27/2022)   PRAPARE - Transportation   . Lack of Transportation (Medical): No   . Lack of Transportation (Non-Medical): No  Physical Activity: Insufficiently Active (03/29/2022)   Exercise Vital Sign   . Days of Exercise per Week: 2 days   . Minutes of Exercise per Session: 10 min  Stress: Stress Concern Present (05/07/2022)   [employer] of Occupational Health - Occupational Stress Questionnaire   . Feeling of Stress : To some extent  Social Connections: Not on file  Intimate Partner Violence: Not At Risk (06/20/2023)   Humiliation,  Afraid, Rape, and Kick questionnaire   . Fear of Current or Ex-Partner: No   . Emotionally Abused: No   . Physically Abused: No   . Sexually Abused: No    FAMILY HISTORY:  Family History  Problem Relation Age of Onset  . Healthy Mother   . Cervical cancer Mother   . Cancer Father   . Colon cancer Maternal Grandmother   . Colon cancer Maternal Grandfather     CURRENT MEDICATIONS:  Outpatient Encounter Medications as of 08/01/2023  Medication Sig Note  . albuterol (VENTOLIN HFA) 108 (90 Base) MCG/ACT inhaler INHALE 2 PUFFS BY MOUTH EVERY 6 HOURS AS NEEDED FOR SHORTNESS OF BREATH   . amLODipine (  NORVASC) 5 MG tablet Take 1 tablet (5 mg total) by mouth daily.   [name] aspirin EC 81 MG tablet Take 81 mg by mouth daily. Swallow whole. 07/31/2023: prn  . b complex vitamins capsule Take 1 capsule by mouth daily. With D3 gummie   . betamethasone dipropionate 0.05 % lotion Apply 1-2 times daily to affected itchy areas at scalp. Avoid applying to face, groin, and axilla. Use as directed. Long-term use can cause thinning of the skin. 07/31/2023: prn  . cetirizine (ZYRTEC) 10 MG tablet Take 10 mg by mouth daily.   . Cholecalciferol (VITAMIN D-3 PO) Take by mouth daily at 6 (six) AM.   . clindamycin (CLEOCIN T) 1 % external solution Apply topically to acne bumps at aa's qd 07/31/2023: prn  . clobetasol (TEMOVATE) 0.05 % external solution Apply 1 application. topically 2 (two) times daily. Use as directed.  Mix with cerave cream. Use for up to 4 weeks. Avoid applying to face, groin, and axilla. Use as directed. 07/31/2023: prn  . clobetasol cream (TEMOVATE) 0.05 % Apply small amount daily before bed to right ankle or other areas of body as needed for itchy rash Avoid applying to face, groin, and axilla Use as directed. 07/31/2023: prn  . cyclobenzaprine (FLEXERIL) 5 MG tablet Take 1 tablet (5 mg total) by mouth at bedtime as needed for muscle spasms.   . diphenhydrAMINE-zinc acetate (BENADRYL EXTRA STRENGTH)  cream Apply 1 application topically 3 (three) times daily as needed for itching.   . docusate sodium (COLACE) 100 MG capsule Take 100 mg by mouth 2 (two) times daily.   . DULoxetine (CYMBALTA) 30 MG capsule Take 60 mg by mouth daily.   . dupilumab (DUPIXENT) 300 MG/2ML prefilled syringe Inject 300 mg into the skin every 14 (fourteen) days. Starting at day 15 for maintenance.   . fenofibrate (TRICOR) 145 MG tablet Take 1 tablet (145 mg total) by mouth daily.   . Fluocinolone Acetonide 0.01 % OIL Use 1 - 2 drop qd/bid prn for scale at ears 07/31/2023: prn  . fluocinonide (LIDEX) 0.05 % external solution Apply once or twice daily to affected areas on scalp up to 2 weeks as needed for itching. Avoid applying to face, groin, and axilla.   . fluticasone (FLONASE ALLERGY RELIEF) 50 MCG/ACT nasal spray Place 2 sprays into both nostrils daily as needed for allergies or rhinitis. (Patient taking differently: Place 2 sprays into both nostrils daily.)   . gabapentin (NEURONTIN) 300 MG capsule Take 300 mg by mouth 3 (three) times daily.   [name] guaiFENesin (MUCINEX) 600 MG 12 hr tablet Take by mouth.   . hydrOXYzine (VISTARIL) 25 MG capsule Take 1 capsule (25 mg total) by mouth every 8 (eight) hours as needed. Take before bed as needed for itchy. Can cause drowsiness.   [name] ketoconazole (NIZORAL) 2 % shampoo Apply 1 Application topically daily as needed for irritation.   . montelukast (SINGULAIR) 10 MG tablet TAKE 1 TABLET BY MOUTH AT BEDTIME   . naproxen (NAPROSYN) 500 MG tablet Take 500 mg by mouth 2 (two) times daily.   [name] NEEDLE, DISP, 18 G 18G X 1-1/2" MISC Use to draw up testosterone for injection   . omega-3 acid ethyl esters (LOVAZA) 1 g capsule Take 2 capsules (2 g total) by mouth 2 (two) times daily.   [name] omeprazole (PRILOSEC) 40 MG capsule Take 1 capsule by mouth twice daily   . ondansetron (ZOFRAN) 4 MG tablet Take 1 tablet (4 mg total) by  mouth every 6 (six) hours as needed for nausea.   . potassium  gluconate (RA POTASSIUM GLUCONATE) 595 (99 K) MG TABS tablet Take 1 tablet by mouth daily.   . propranolol (INDERAL) 20 MG tablet Take one tablet 20 mg  twice daily  may take an  additional dose of 20 mg  as needed for palpitations   . rosuvastatin (CRESTOR) 40 MG tablet Take 1 tablet (40 mg total) by mouth daily.   [name] senna (SENOKOT) 8.6 MG tablet Take 1 tablet by mouth daily. prn   . SYRINGE-NEEDLE, DISP, 3 ML 22G X 1-1/2" 3 ML MISC Use to inject testosterone   . tacrolimus (PROTOPIC) 0.1 % ointment APPLY TOPICALLY TO AFFECTED AREAS OF BODY FOR RASH DAILY OR TWICE DAILY AS NEEDED   . Testosterone 25 MG/2.5GM (1%) GEL Place 2.5 g onto the skin in the morning.   . triamcinolone cream (KENALOG) 0.1 %  07/31/2023: prn  . Upadacitinib ER (RINVOQ) 15 MG TB24 Take 1 tablet (15 mg total) by mouth daily.    No facility-administered encounter medications on file as of 08/01/2023.    ALLERGIES:  Allergies  Allergen Reactions  . Gramineae Pollens Itching  . Claritin [Loratadine] Other (See Comments)    Heart race  . Loratadine-Pseudoephedrine Er Palpitations  . Pollen Extract Other (See Comments)    Seasonal allergies    LABORATORY DATA:  I have reviewed the labs as listed.  CBC    Component Value Date/Time   WBC 8.7 07/25/2023 0849   RBC 5.51 07/25/2023 0849   HGB 16.9 07/25/2023 0849   HGB 16.8 07/03/2023 1259   HCT 50.0 07/25/2023 0849   HCT 49.6 07/03/2023 1259   PLT 299 07/25/2023 0849   PLT 291 01/07/2023 1556   MCV 90.7 07/25/2023 0849   MCV 89 01/07/2023 1556   MCH 30.7 07/25/2023 0849   MCHC 33.8 07/25/2023 0849   RDW 13.6 07/25/2023 0849   RDW 13.1 01/07/2023 1556   LYMPHSABS 3.5 07/25/2023 0849   LYMPHSABS 2.7 12/02/2022 0859   MONOABS 0.7 07/25/2023 0849   EOSABS 0.1 07/25/2023 0849   EOSABS 0.1 12/02/2022 0859   BASOSABS 0.1 07/25/2023 0849   BASOSABS 0.1 12/02/2022 0859      Latest Ref Rng & Units 07/31/2023    1:49 PM 05/22/2023   12:05 PM 03/24/2023    9:24 AM   CMP  Glucose 65 - 99 mg/dL 92  91  960   BUN 7 - 25 mg/dL 19  18  15    Creatinine 0.60 - 1.29 mg/dL 4.54  0.98  1.19   Sodium 135 - 146 mmol/L 139  142  142   Potassium 3.5 - 5.3 mmol/L 4.4  4.6  4.8   Chloride 98 - 110 mmol/L 102  102  103   CO2 20 - 32 mmol/L 29  20  23    Calcium 8.6 - 10.3 mg/dL 14.7  82.9  56.2   Total Protein 6.1 - 8.1 g/dL 7.6  7.6  7.4   Total Bilirubin 0.2 - 1.2 mg/dL 0.6  0.8  0.5   Alkaline Phos 44 - 121 IU/L  69  74   AST 10 - 40 U/L 45  79  46   ALT 9 - 46 U/L 79  94  73     DIAGNOSTIC IMAGING:  I have independently reviewed the relevant imaging and discussed with the patient.   WRAP UP:  All questions were answered. The patient knows to call  the clinic with any problems, questions or concerns.  Medical decision making: Moderate  Time spent on visit: I spent 20 minutes counseling the patient face to face. The total time spent in the appointment was 30 minutes and more than 50% was on counseling.  Elijah Kaufmann, NP  01/23/2023 8:40 AM

## 2023-08-04 ENCOUNTER — Encounter: Payer: Self-pay | Admitting: Hematology

## 2023-08-04 LAB — COMPREHENSIVE METABOLIC PANEL
AG Ratio: 2 (calc) (ref 1.0–2.5)
ALT: 79 U/L — ABNORMAL HIGH (ref 9–46)
AST: 45 U/L — ABNORMAL HIGH (ref 10–40)
Albumin: 5.1 g/dL (ref 3.6–5.1)
Alkaline phosphatase (APISO): 58 U/L (ref 36–130)
BUN/Creatinine Ratio: 11 (calc) (ref 6–22)
BUN: 19 mg/dL (ref 7–25)
CO2: 29 mmol/L (ref 20–32)
Calcium: 10.9 mg/dL — ABNORMAL HIGH (ref 8.6–10.3)
Chloride: 102 mmol/L (ref 98–110)
Creat: 1.75 mg/dL — ABNORMAL HIGH (ref 0.60–1.29)
Globulin: 2.5 g/dL (calc) (ref 1.9–3.7)
Glucose, Bld: 92 mg/dL (ref 65–99)
Potassium: 4.4 mmol/L (ref 3.5–5.3)
Sodium: 139 mmol/L (ref 135–146)
Total Bilirubin: 0.6 mg/dL (ref 0.2–1.2)
Total Protein: 7.6 g/dL (ref 6.1–8.1)

## 2023-08-04 LAB — IRON, TOTAL/TOTAL IRON BINDING CAP
%SAT: 28 % (calc) (ref 20–48)
Iron: 142 ug/dL (ref 50–180)
TIBC: 500 mcg/dL (calc) — ABNORMAL HIGH (ref 250–425)

## 2023-08-04 LAB — ANA: Anti Nuclear Antibody (ANA): NEGATIVE

## 2023-08-04 LAB — IGG, IGA, IGM
IgG (Immunoglobin G), Serum: 1020 mg/dL (ref 600–1640)
IgM, Serum: 27 mg/dL — ABNORMAL LOW (ref 50–300)
Immunoglobulin A: 154 mg/dL (ref 47–310)

## 2023-08-04 LAB — MITOCHONDRIAL ANTIBODIES: Mitochondrial M2 Ab, IgG: <=20 U (ref <=20.0)

## 2023-08-04 LAB — CERULOPLASMIN: Ceruloplasmin: 15 mg/dL (ref 14–30)

## 2023-08-04 LAB — ANTI-SMOOTH MUSCLE ANTIBODY, IGG: Actin (Smooth Muscle) Antibody (IGG): <20 U (ref <20)

## 2023-08-04 LAB — ALPHA-1-ANTITRYPSIN: A-1 Antitrypsin, Ser: 119 mg/dL (ref 83–199)

## 2023-08-04 NOTE — Telephone Encounter (Signed)
FYI

## 2023-08-05 ENCOUNTER — Encounter: Payer: 59 | Admitting: Internal Medicine

## 2023-08-05 ENCOUNTER — Encounter (INDEPENDENT_AMBULATORY_CARE_PROVIDER_SITE_OTHER): Payer: Self-pay | Admitting: Gastroenterology

## 2023-08-05 ENCOUNTER — Encounter: Payer: Self-pay | Admitting: Internal Medicine
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
## 2023-08-06 ENCOUNTER — Encounter: Payer: Self-pay | Admitting: Internal Medicine

## 2023-08-06 ENCOUNTER — Ambulatory Visit (INDEPENDENT_AMBULATORY_CARE_PROVIDER_SITE_OTHER): Payer: 59 | Admitting: Internal Medicine

## 2023-08-06 VITALS — BP 126/81 | HR 80 | Ht 75.0 in | Wt 273.8 lb

## 2023-08-06 DIAGNOSIS — S29011A Strain of muscle and tendon of front wall of thorax, initial encounter: Secondary | ICD-10-CM | POA: Diagnosis not present

## 2023-08-06 DIAGNOSIS — M5441 Lumbago with sciatica, right side: Secondary | ICD-10-CM | POA: Diagnosis not present

## 2023-08-06 DIAGNOSIS — K581 Irritable bowel syndrome with constipation: Secondary | ICD-10-CM

## 2023-08-06 DIAGNOSIS — R140 Abdominal distension (gaseous): Secondary | ICD-10-CM | POA: Insufficient documentation

## 2023-08-06 DIAGNOSIS — K7581 Nonalcoholic steatohepatitis (NASH): Secondary | ICD-10-CM | POA: Diagnosis not present

## 2023-08-06 DIAGNOSIS — R5382 Chronic fatigue, unspecified: Secondary | ICD-10-CM | POA: Diagnosis not present

## 2023-08-06 DIAGNOSIS — G8929 Other chronic pain: Secondary | ICD-10-CM | POA: Diagnosis not present

## 2023-08-06 DIAGNOSIS — M5442 Lumbago with sciatica, left side: Secondary | ICD-10-CM

## 2023-08-06 MED ORDER — LUBIPROSTONE 8 MCG PO CAPS
8.0000 ug | ORAL_CAPSULE | Freq: Two times a day (BID) | ORAL | 3 refills | Status: DC
Start: 2023-08-06 — End: 2023-10-28

## 2023-08-06 MED ORDER — CYCLOBENZAPRINE HCL 5 MG PO TABS
5.0000 mg | ORAL_TABLET | Freq: Every evening | ORAL | 1 refills | Status: DC | PRN
Start: 2023-08-06 — End: 2024-06-16

## 2023-08-06 NOTE — Progress Notes (Signed)
Acute Office Visit  Subjective:    Patient ID: Elijah Shaffer, male    DOB: Jun 07, 1981, 42 y.o.   MRN: [id number]  Chief Complaint  Patient presents with   Abdominal Pain    Abdominal pain    Chest Pain    Chest discomfort when trying to move or work with tools     HPI Patient is in today for complaint of abdominal discomfort and distention for the last 2 weeks.  He complains of chronic constipation, which leads to abdominal pain.  He also reports bloating.  Denies any melena or hematochezia.  He has been taking Dulcolax without much relief. He had a CT abdomen in the last week, which showed severe hepatic steatosis, gallbladder sludge or stone without cholecystitis, large hiatal hernia and colonic diverticulosis without diverticulitis.  He also reports right sided chest wall discomfort especially while operating heavy machinery.  His EKG has been unremarkable.  He has had cardiac cath and stress test in 2023, which were negative for CAD.   Past Medical History:  Diagnosis Date   Acute renal injury (HCC) 06/28/2017   Anxiety    Aspiration pneumonia of right upper lobe due to gastric secretions (HCC)    Diverticulitis    GERD (gastroesophageal reflux disease) 01/08/2018   Seasonal allergies    Syncope 06/26/2021    Past Surgical History:  Procedure Laterality Date   BIOPSY  03/18/2021   Procedure: BIOPSY;  Surgeon: Dolores Frame, MD;  Location: [facility];  Service: Gastroenterology;;  esophageal at Z-line   BIOPSY  05/23/2021   Procedure: BIOPSY;  Surgeon: Dolores Frame, MD;  Location: [facility];  Service: Gastroenterology;;   CARDIOPULMONARY EXERCISE TEST (CPX)  01/09/2022   Interpretation limited by submaximal effort.  Severe functional impairment when compared to max sedentary norms.  No cardiopulmonary limitations.  Noted tremor and generalized weakness that lead to premature exercise termination-consider neuromuscular evaluation.    COLONOSCOPY N/A 09/22/2017   Procedure: COLONOSCOPY;  Surgeon: Malissa Hippo, MD;  Location: [facility];  Service: Endoscopy;  Laterality: N/A;  9:55   COLONOSCOPY WITH PROPOFOL N/A 05/23/2021   Procedure: COLONOSCOPY WITH PROPOFOL;  Surgeon: Dolores Frame, MD;  Location: [facility];  Service: Gastroenterology;  Laterality: N/A;  7:30   ESOPHAGOGASTRODUODENOSCOPY (EGD) WITH PROPOFOL N/A 03/18/2021   Procedure: ESOPHAGOGASTRODUODENOSCOPY (EGD) WITH PROPOFOL;  Surgeon: Dolores Frame, MD;  Location: [facility];  Service: Gastroenterology;  Laterality: N/A;   ESOPHAGOGASTRODUODENOSCOPY (EGD) WITH PROPOFOL N/A 05/23/2021   Procedure: ESOPHAGOGASTRODUODENOSCOPY (EGD) WITH PROPOFOL;  Surgeon: Dolores Frame, MD;  Location: [facility];  Service: Gastroenterology;  Laterality: N/A;   POLYPECTOMY  09/22/2017   Procedure: POLYPECTOMY;  Surgeon: Malissa Hippo, MD;  Location: [facility];  Service: Endoscopy;;   RIGHT/LEFT HEART CATH AND CORONARY ANGIOGRAPHY N/A 12/12/2021   Procedure: RIGHT/LEFT HEART CATH AND CORONARY ANGIOGRAPHY;  Surgeon: Dolores Patty, MD;  Location: [facility];  Service: Cardiovascular:: Normal Coronaries & Hemodynamics.  EF 50-55%. RA = 4 mmHg, RV = 23/4; PA = 22/6 (14) & PCW = 7; Ao sat = 95%; PA sat = 76%, 76%& SVC sat = 74%Fick cardiac output/index = 7.1/3.0; PVR = 1.0 WU   TRANSTHORACIC ECHOCARDIOGRAM  02/24/2021   EF 60 to 65%.  Normal LV function.  Normal valves.  Mild RV dilation with normal function.   ZIO PATCH EVENT MONITOR  01/2022   Sinus rhythm with heart rate range 24  to 159 bpm, 1  AVB and Wenckebach block noted along with rare PACs and PVCs.  No arrhythmias.    Family History  Problem Relation Age of Onset   Healthy Mother    Cervical cancer Mother    Cancer Father    Colon cancer Maternal Grandmother    Colon cancer Maternal Grandfather     Social History   Socioeconomic History   Marital  status: Single    Spouse name: Not on file   Number of children: Not on file   Years of education: Not on file   Highest education level: Not on file  Occupational History   Not on file  Tobacco Use   Smoking status: Never    Passive exposure: Past   Smokeless tobacco: Never  Vaping Use   Vaping status: Never Used  Substance and Sexual Activity   Alcohol use: Yes    Comment: occ   Drug use: No   Sexual activity: Not on file  Other Topics Concern   Not on file  Social History Narrative   He currently has his own lawn care landscaping service. He previous had 80 yards before he got sick and since decreased. He works around a Interior and spatial designer. One of the chemical names is roundup.    Social Determinants of Health   Financial Resource Strain: Not on file  Food Insecurity: No Food Insecurity (06/20/2023)   Hunger Vital Sign    Worried About Running Out of Food in the Last Year: Never true    Ran Out of Food in the Last Year: Never true  Transportation Needs: No Transportation Needs (12/27/2022)   PRAPARE - Administrator, Civil Service (Medical): No    Lack of Transportation (Non-Medical): No  Physical Activity: Insufficiently Active (03/29/2022)   Exercise Vital Sign    Days of Exercise per Week: 2 days    Minutes of Exercise per Session: 10 min  Stress: Stress Concern Present (05/07/2022)   [employer] of Occupational Health - Occupational Stress Questionnaire    Feeling of Stress : To some extent  Social Connections: Not on file  Intimate Partner Violence: Not At Risk (06/20/2023)   Humiliation, Afraid, Rape, and Kick questionnaire    Fear of Current or Ex-Partner: No    Emotionally Abused: No    Physically Abused: No    Sexually Abused: No    Outpatient Medications Prior to Visit  Medication Sig Dispense Refill   albuterol (VENTOLIN HFA) 108 (90 Base) MCG/ACT inhaler INHALE 2 PUFFS BY MOUTH EVERY 6 HOURS AS NEEDED FOR SHORTNESS OF BREATH 9 g 0    amLODipine (NORVASC) 5 MG tablet Take 1 tablet (5 mg total) by mouth daily. 90 tablet 1   aspirin EC 81 MG tablet Take 81 mg by mouth daily. Swallow whole.     b complex vitamins capsule Take 1 capsule by mouth daily. With D3 gummie     betamethasone dipropionate 0.05 % lotion Apply 1-2 times daily to affected itchy areas at scalp. Avoid applying to face, groin, and axilla. Use as directed. Long-term use can cause thinning of the skin. 60 mL 2   cetirizine (ZYRTEC) 10 MG tablet Take 10 mg by mouth daily.     Cholecalciferol (VITAMIN D-3 PO) Take by mouth daily at 6 (six) AM.     clindamycin (CLEOCIN T) 1 % external solution Apply topically to acne bumps at aa's qd 30 mL 3   clobetasol (TEMOVATE) 0.05 % external  solution Apply 1 application. topically 2 (two) times daily. Use as directed.  Mix with cerave cream. Use for up to 4 weeks. Avoid applying to face, groin, and axilla. Use as directed. 50 mL 1   clobetasol cream (TEMOVATE) 0.05 % Apply small amount daily before bed to right ankle or other areas of body as needed for itchy rash Avoid applying to face, groin, and axilla Use as directed. 30 g 0   diphenhydrAMINE-zinc acetate (BENADRYL EXTRA STRENGTH) cream Apply 1 application topically 3 (three) times daily as needed for itching. 28.4 g 0   docusate sodium (COLACE) 100 MG capsule Take 100 mg by mouth 2 (two) times daily.     DULoxetine (CYMBALTA) 30 MG capsule Take 60 mg by mouth daily.     dupilumab (DUPIXENT) 300 MG/2ML prefilled syringe Inject 300 mg into the skin every 14 (fourteen) days. Starting at day 15 for maintenance. 4 mL 5   fenofibrate (TRICOR) 145 MG tablet Take 1 tablet (145 mg total) by mouth daily. 90 tablet 3   Fluocinolone Acetonide 0.01 % OIL Use 1 - 2 drop qd/bid prn for scale at ears 20 mL 2   fluocinonide (LIDEX) 0.05 % external solution Apply once or twice daily to affected areas on scalp up to 2 weeks as needed for itching. Avoid applying to face, groin, and axilla. 60 mL  2   fluticasone (FLONASE ALLERGY RELIEF) 50 MCG/ACT nasal spray Place 2 sprays into both nostrils daily as needed for allergies or rhinitis. (Patient taking differently: Place 2 sprays into both nostrils daily.) 9.9 mL 2   gabapentin (NEURONTIN) 300 MG capsule Take 300 mg by mouth 3 (three) times daily.     guaiFENesin (MUCINEX) 600 MG 12 hr tablet Take by mouth.     hydrOXYzine (VISTARIL) 25 MG capsule Take 1 capsule (25 mg total) by mouth every 8 (eight) hours as needed. Take before bed as needed for itchy. Can cause drowsiness. 60 capsule 2   ketoconazole (NIZORAL) 2 % shampoo Apply 1 Application topically daily as needed for irritation. 120 mL 11   montelukast (SINGULAIR) 10 MG tablet TAKE 1 TABLET BY MOUTH AT BEDTIME 30 tablet 0   naproxen (NAPROSYN) 500 MG tablet Take 500 mg by mouth 2 (two) times daily.     NEEDLE, DISP, 18 G 18G X 1-1/2" MISC Use to draw up testosterone for injection 2 each 12   omega-3 acid ethyl esters (LOVAZA) 1 g capsule Take 2 capsules (2 g total) by mouth 2 (two) times daily. 120 capsule 3   omeprazole (PRILOSEC) 40 MG capsule Take 1 capsule by mouth twice daily 180 capsule 0   ondansetron (ZOFRAN) 4 MG tablet Take 1 tablet (4 mg total) by mouth every 6 (six) hours as needed for nausea. 20 tablet 0   potassium gluconate (RA POTASSIUM GLUCONATE) 595 (99 K) MG TABS tablet Take 1 tablet by mouth daily.     propranolol (INDERAL) 20 MG tablet Take one tablet 20 mg  twice daily  may take an  additional dose of 20 mg  as needed for palpitations 190 tablet 3   rosuvastatin (CRESTOR) 40 MG tablet Take 1 tablet (40 mg total) by mouth daily. 90 tablet 3   senna (SENOKOT) 8.6 MG tablet Take 1 tablet by mouth daily. prn     SYRINGE-NEEDLE, DISP, 3 ML 22G X 1-1/2" 3 ML MISC Use to inject testosterone 2 each 12   tacrolimus (PROTOPIC) 0.1 % ointment APPLY TOPICALLY TO AFFECTED AREAS  OF BODY FOR RASH DAILY OR TWICE DAILY AS NEEDED 30 g 0   Testosterone 25 MG/2.5GM (1%) GEL Place 2.5  g onto the skin in the morning. 75 g 5   triamcinolone cream (KENALOG) 0.1 %      Upadacitinib ER (RINVOQ) 15 MG TB24 Take 1 tablet (15 mg total) by mouth daily. 30 tablet 5   cyclobenzaprine (FLEXERIL) 5 MG tablet Take 1 tablet (5 mg total) by mouth at bedtime as needed for muscle spasms. 30 tablet 1   No facility-administered medications prior to visit.    Allergies  Allergen Reactions   Gramineae Pollens Itching   Claritin [Loratadine] Other (See Comments)    Heart race   Loratadine-Pseudoephedrine Er Palpitations   Pollen Extract Other (See Comments)    Seasonal allergies    Review of Systems  Constitutional:  Positive for fatigue. Negative for chills and fever.  HENT:  Negative for congestion and sore throat.   Eyes:  Negative for pain and discharge.  Respiratory:  Positive for shortness of breath. Negative for cough.   Cardiovascular:  Negative for palpitations.  Gastrointestinal:  Positive for abdominal pain and constipation. Negative for diarrhea, nausea and vomiting.  Endocrine: Negative for polydipsia and polyuria.  Genitourinary:  Negative for dysuria and hematuria.  Musculoskeletal:  Positive for arthralgias, back pain, myalgias and neck pain. Negative for neck stiffness.  Neurological:  Positive for tremors. Negative for headaches.  Psychiatric/Behavioral:  Positive for sleep disturbance. Negative for agitation and behavioral problems. The patient is nervous/anxious.        Objective:    Physical Exam Vitals reviewed.  Constitutional:      General: He is not in acute distress.    Appearance: He is not diaphoretic.  HENT:     Head: Normocephalic and atraumatic.     Nose: Nose normal.     Mouth/Throat:     Mouth: Mucous membranes are moist.  Eyes:     General: No scleral icterus.    Extraocular Movements: Extraocular movements intact.  Cardiovascular:     Rate and Rhythm: Normal rate and regular rhythm.     Heart sounds: Normal heart sounds. No murmur  heard. Pulmonary:     Breath sounds: Normal breath sounds. No wheezing or rales.  Abdominal:     General: Bowel sounds are normal.     Palpations: Abdomen is soft.     Tenderness: There is abdominal tenderness (Mild, generalized).  Musculoskeletal:        General: Tenderness (Mid thoracic and lumbar spine area) present.     Cervical back: Neck supple. No tenderness.     Right lower leg: No edema.     Left lower leg: No edema.  Skin:    General: Skin is warm.     Coloration: Skin is not jaundiced.  Neurological:     General: No focal deficit present.     Mental Status: He is alert and oriented to person, place, and time.     Sensory: No sensory deficit.     Motor: No weakness.     Comments: Functional tremors on right hand  Psychiatric:        Mood and Affect: Mood is anxious and depressed. Affect is flat.        Thought Content: Thought content does not include homicidal or suicidal ideation.     BP 126/81 (BP Location: Right Arm, Patient Position: Sitting, Cuff Size: Large)   Pulse 80   Ht 6\' 3"  (1.905 m)  Wt 273 lb 12.8 oz (124.2 kg)   SpO2 95%   BMI 34.22 kg/m  Wt Readings from Last 3 Encounters:  08/06/23 273 lb 12.8 oz (124.2 kg)  08/05/23 276 lb 6.4 oz (125.4 kg)  08/01/23 276 lb 7.3 oz (125.4 kg)        Assessment & Plan:   Problem List Items Addressed This Visit       Digestive   Irritable bowel syndrome with constipation - Primary    Usually has chronic constipation Has tried Dulcolax without much relief Advised to take MiraLAX as needed Used to respond well to [employer], advised to hold if he has diarrhea Follow-up with GI      Relevant Medications   lubiprostone (AMITIZA) 8 MCG capsule   NASH (nonalcoholic steatohepatitis)    Noted on CT abdomen recently Slightly elevated liver enzymes Followed by GI Needs to follow low-carb diet and focus on weight loss        Nervous and Auditory   Chronic low back pain with bilateral  sciatica    Likely due to strenuous activity or heavy lifting Avoid frequent bending and heavy lifting Flexeril as needed for muscle spasms Heating pad as needed Back brace as needed      Relevant Medications   cyclobenzaprine (FLEXERIL) 5 MG tablet     Musculoskeletal and Integument   Pectoralis muscle strain    Right sided upper chest wall pain likely due to pectoralis muscle strain Unable to take oral NSAIDs due to GERD Flexeril as needed for muscle spasms        Other   Hypercalcemia    Last 2 BMP showed mild hypercalcemia Recheck BMP and PTH      Relevant Orders   Basic Metabolic Panel (BMET)   Parathyroid hormone, intact (no Ca)   Chronic fatigue    Unclear etiology No other B symptoms Has had extensive cardiac and pulmonology workup including echo, stress test, cardiac cath-overall benign Recently has had declining GFR - check BMP, planned to see nephrology Check BMP, CK, PTH and acetylcholine blocking receptor      Relevant Orders   CK (Creatine Kinase)   Acetylcholine receptor, blocking     Meds ordered this encounter  Medications   lubiprostone (AMITIZA) 8 MCG capsule    Sig: Take 1 capsule (8 mcg total) by mouth 2 (two) times daily with a meal.    Dispense:  60 capsule    Refill:  3   cyclobenzaprine (FLEXERIL) 5 MG tablet    Sig: Take 1 tablet (5 mg total) by mouth at bedtime as needed for muscle spasms.    Dispense:  30 tablet    Refill:  1     Daphna Lafuente Concha Se, MD

## 2023-08-06 NOTE — Progress Notes (Signed)
Care Coordination Note  08/06/2023 Name: Elijah Shaffer MRN: [id number] DOB: Aug 17, 1981  Elijah Shaffer is a 42 y.o. year old male who is a primary care patient of Anabel Halon, MD and is actively engaged with the care management team. I reached out to Beverly Gust by phone today to assist with re-scheduling a follow up visit with the RN Case Manager  Follow up plan: Telephone appointment with care management team member scheduled for:08/27/23  [facility] Coordination Care Guide  Direct Dial: [phone]

## 2023-08-06 NOTE — Assessment & Plan Note (Addendum)
Unclear etiology No other B symptoms Has had extensive cardiac and pulmonology workup including echo, stress test, cardiac cath-overall benign Recently has had declining GFR - check BMP, planned to see nephrology Check BMP, CK, PTH and acetylcholine blocking receptor

## 2023-08-06 NOTE — Patient Instructions (Addendum)
Please start taking Amitiza as prescribed.  Please continue taking Colace up to twice daily. Please take Miralax as needed for constipation.  Maintain at least 64 ounces of fluid intake in a day.

## 2023-08-06 NOTE — Assessment & Plan Note (Addendum)
Usually has chronic constipation Has tried Dulcolax without much relief Advised to take MiraLAX as needed Used to respond well to [employer], advised to hold if he has diarrhea Follow-up with GI

## 2023-08-06 NOTE — Assessment & Plan Note (Signed)
Right sided upper chest wall pain likely due to pectoralis muscle strain Unable to take oral NSAIDs due to GERD Flexeril as needed for muscle spasms

## 2023-08-06 NOTE — Assessment & Plan Note (Signed)
Last 2 BMP showed mild hypercalcemia Recheck BMP and PTH

## 2023-08-06 NOTE — Assessment & Plan Note (Signed)
Noted on CT abdomen recently Slightly elevated liver enzymes Followed by GI Needs to follow low-carb diet and focus on weight loss

## 2023-08-06 NOTE — Assessment & Plan Note (Signed)
Likely due to strenuous activity or heavy lifting Avoid frequent bending and heavy lifting Flexeril as needed for muscle spasms Heating pad as needed Back brace as needed

## 2023-08-07 LAB — BASIC METABOLIC PANEL
BUN/Creatinine Ratio: 11 (ref 9–20)
BUN: 19 mg/dL (ref 6–24)
CO2: 20 mmol/L (ref 20–29)
Calcium: 10.4 mg/dL — ABNORMAL HIGH (ref 8.7–10.2)
Chloride: 104 mmol/L (ref 96–106)
Creatinine, Ser: 1.78 mg/dL — ABNORMAL HIGH (ref 0.76–1.27)
Glucose: 122 mg/dL — ABNORMAL HIGH (ref 70–99)
Potassium: 4.5 mmol/L (ref 3.5–5.2)
Sodium: 140 mmol/L (ref 134–144)
eGFR: 48 mL/min/{1.73_m2} — ABNORMAL LOW (ref >59)

## 2023-08-07 LAB — CK: Total CK: 315 U/L (ref 49–439)

## 2023-08-08 ENCOUNTER — Encounter (HOSPITAL_COMMUNITY): Payer: Self-pay | Admitting: *Deleted

## 2023-08-08 ENCOUNTER — Emergency Department (HOSPITAL_COMMUNITY)
Admission: EM | Admit: 2023-08-08 | Discharge: 2023-08-08 | Disposition: A | Discharge status: Home / Self Care | Payer: 59

## 2023-08-08 ENCOUNTER — Other Ambulatory Visit: Payer: Self-pay

## 2023-08-08 ENCOUNTER — Emergency Department (HOSPITAL_COMMUNITY): Payer: 59

## 2023-08-08 ENCOUNTER — Inpatient Hospital Stay: Payer: 59

## 2023-08-08 DIAGNOSIS — N1832 Chronic kidney disease, stage 3b: Secondary | ICD-10-CM | POA: Diagnosis not present

## 2023-08-08 DIAGNOSIS — R0789 Other chest pain: Secondary | ICD-10-CM | POA: Insufficient documentation

## 2023-08-08 DIAGNOSIS — Z7982 Long term (current) use of aspirin: Secondary | ICD-10-CM | POA: Diagnosis not present

## 2023-08-08 DIAGNOSIS — Z87891 Personal history of nicotine dependence: Secondary | ICD-10-CM | POA: Diagnosis not present

## 2023-08-08 DIAGNOSIS — R1011 Right upper quadrant pain: Secondary | ICD-10-CM | POA: Diagnosis not present

## 2023-08-08 DIAGNOSIS — R109 Unspecified abdominal pain: Secondary | ICD-10-CM

## 2023-08-08 DIAGNOSIS — K760 Fatty (change of) liver, not elsewhere classified: Secondary | ICD-10-CM | POA: Diagnosis not present

## 2023-08-08 DIAGNOSIS — Z79899 Other long term (current) drug therapy: Secondary | ICD-10-CM | POA: Insufficient documentation

## 2023-08-08 DIAGNOSIS — R9431 Abnormal electrocardiogram [ECG] [EKG]: Secondary | ICD-10-CM | POA: Diagnosis not present

## 2023-08-08 DIAGNOSIS — K5730 Diverticulosis of large intestine without perforation or abscess without bleeding: Secondary | ICD-10-CM | POA: Diagnosis not present

## 2023-08-08 LAB — CBC WITH DIFFERENTIAL/PLATELET
Abs Immature Granulocytes: 0.08 10*3/uL — ABNORMAL HIGH (ref 0.00–0.07)
Basophils Absolute: 0.1 10*3/uL (ref 0.0–0.1)
Basophils Relative: 1 %
Eosinophils Absolute: 0 10*3/uL (ref 0.0–0.5)
Eosinophils Relative: 1 %
HCT: 46.1 % (ref 39.0–52.0)
Hemoglobin: 15.7 g/dL (ref 13.0–17.0)
Immature Granulocytes: 1 %
Lymphocytes Relative: 41 %
Lymphs Abs: 3.4 10*3/uL (ref 0.7–4.0)
MCH: 30.8 pg (ref 26.0–34.0)
MCHC: 34.1 g/dL (ref 30.0–36.0)
MCV: 90.4 fL (ref 80.0–100.0)
Monocytes Absolute: 0.8 10*3/uL (ref 0.1–1.0)
Monocytes Relative: 9 %
Neutro Abs: 4 10*3/uL (ref 1.7–7.7)
Neutrophils Relative %: 47 %
Platelets: 361 10*3/uL (ref 150–400)
RBC: 5.1 MIL/uL (ref 4.22–5.81)
RDW: 13 % (ref 11.5–15.5)
WBC: 8.3 10*3/uL (ref 4.0–10.5)
nRBC: 0 % (ref 0.0–0.2)

## 2023-08-08 LAB — COMPREHENSIVE METABOLIC PANEL
ALT: 90 U/L — ABNORMAL HIGH (ref 0–44)
AST: 54 U/L — ABNORMAL HIGH (ref 15–41)
Albumin: 4.6 g/dL (ref 3.5–5.0)
Alkaline Phosphatase: 52 U/L (ref 38–126)
Anion gap: 10 (ref 5–15)
BUN: 19 mg/dL (ref 6–20)
CO2: 23 mmol/L (ref 22–32)
Calcium: 9.6 mg/dL (ref 8.9–10.3)
Chloride: 105 mmol/L (ref 98–111)
Creatinine, Ser: 1.66 mg/dL — ABNORMAL HIGH (ref 0.61–1.24)
GFR, Estimated: 52 mL/min — ABNORMAL LOW (ref >60)
Glucose, Bld: 109 mg/dL — ABNORMAL HIGH (ref 70–99)
Potassium: 4 mmol/L (ref 3.5–5.1)
Sodium: 138 mmol/L (ref 135–145)
Total Bilirubin: 0.7 mg/dL (ref 0.3–1.2)
Total Protein: 7.1 g/dL (ref 6.5–8.1)

## 2023-08-08 LAB — URINALYSIS, ROUTINE W REFLEX MICROSCOPIC
Bilirubin Urine: NEGATIVE
Glucose, UA: NEGATIVE mg/dL
Hgb urine dipstick: NEGATIVE
Ketones, ur: NEGATIVE mg/dL
Leukocytes,Ua: NEGATIVE
Nitrite: NEGATIVE
Protein, ur: NEGATIVE mg/dL
Specific Gravity, Urine: 1.017 (ref 1.005–1.030)
pH: 6 (ref 5.0–8.0)

## 2023-08-08 LAB — TROPONIN I (HIGH SENSITIVITY): Troponin I (High Sensitivity): <2 ng/L (ref <18)

## 2023-08-08 LAB — LIPASE, BLOOD: Lipase: 44 U/L (ref 11–51)

## 2023-08-08 MED ORDER — DICYCLOMINE HCL 10 MG/ML IM SOLN
20.0000 mg | Freq: Once | INTRAMUSCULAR | Status: AC
  Administered 2023-08-08: 20 mg via INTRAMUSCULAR
  Filled 2023-08-08: qty 2

## 2023-08-08 MED ORDER — IOHEXOL 300 MG/ML  SOLN
100.0000 mL | Freq: Once | INTRAMUSCULAR | Status: AC | PRN
  Administered 2023-08-08: 80 mL via INTRAVENOUS

## 2023-08-08 MED ORDER — DICYCLOMINE HCL 20 MG PO TABS: 20.0000 mg | ORAL_TABLET | Freq: Two times a day (BID) | ORAL | 0 refills | Status: DC | PRN

## 2023-08-08 NOTE — ED Notes (Signed)
Patient transported to CT 

## 2023-08-08 NOTE — Progress Notes (Signed)
Patient presents today for phlebotomy per providers orders. Phlebotomy procedure started at 1200 and ended at 1207. 250 cc removed per providers order. Patient tolerated procedure well.  Vital signs WNL.   Procedure tolerated well and without incident. Discharged ambulatory in stable condition.

## 2023-08-08 NOTE — ED Provider Notes (Signed)
EMERGENCY DEPARTMENT AT [facility] Provider Note   CSN: [id number] Arrival date & time: 08/08/23  1234     History  Chief Complaint  Patient presents with   Abdominal Pain    Elijah Shaffer is a 42 y.o. male.   Abdominal Pain   42 year old male presents emergency department with complaints of abdominal pain, abdominal distention as well as right-sided chest pain.  Patient states he has been dealing with symptoms for the past couple of months.  Reports right upper abdominal pain as well as right lower chest pain for the past month and 1/2 to 2 months.  Reports increasing abdominal swelling/distention over the past 2 to 3 months.  Does have a history of IBS with constipation which she follows with GI in the outpatient setting with recently starting Amitiza again as well as uses MiraLAX as needed.  States the pain in his right upper abdomen as well as right lower chest is worsened with certain movements as well as with exertion "sometimes".  Denies real association with consumption of food.  Denies any fever, nausea, vomiting, shortness of breath, urinary symptoms.  States last bowel movement was 2 days ago and regular per patient.  Past medical history significant for GERD, AKI, diverticulitis, hyperlipidemia, CKD 3B  Home Medications Prior to Admission medications   Medication Sig Start Date End Date Taking? Authorizing Provider  dicyclomine (BENTYL) 20 MG tablet Take 1 tablet (20 mg total) by mouth 2 (two) times daily as needed for spasms. 08/08/23  Yes Sherian Maroon A, PA  albuterol (VENTOLIN HFA) 108 (90 Base) MCG/ACT inhaler INHALE 2 PUFFS BY MOUTH EVERY 6 HOURS AS NEEDED FOR SHORTNESS OF BREATH 05/23/23   Anabel Halon, MD  amLODipine (NORVASC) 5 MG tablet Take 1 tablet (5 mg total) by mouth daily. 03/24/23   Anabel Halon, MD  aspirin EC 81 MG tablet Take 81 mg by mouth daily. Swallow whole.    [provider]  b complex vitamins capsule Take 1  capsule by mouth daily. With D3 gummie    [provider]  betamethasone dipropionate 0.05 % lotion Apply 1-2 times daily to affected itchy areas at scalp. Avoid applying to face, groin, and axilla. Use as directed. Long-term use can cause thinning of the skin. 01/15/23   Willeen Niece, MD  cetirizine (ZYRTEC) 10 MG tablet Take 10 mg by mouth daily.    [provider]  Cholecalciferol (VITAMIN D-3 PO) Take by mouth daily at 6 (six) AM.    [provider]  clindamycin (CLEOCIN T) 1 % external solution Apply topically to acne bumps at aa's qd 06/17/22   Willeen Niece, MD  clobetasol (TEMOVATE) 0.05 % external solution Apply 1 application. topically 2 (two) times daily. Use as directed.  Mix with cerave cream. Use for up to 4 weeks. Avoid applying to face, groin, and axilla. Use as directed. 03/21/22   Willeen Niece, MD  clobetasol cream (TEMOVATE) 0.05 % Apply small amount daily before bed to right ankle or other areas of body as needed for itchy rash Avoid applying to face, groin, and axilla Use as directed. 09/16/22   Willeen Niece, MD  cyclobenzaprine (FLEXERIL) 5 MG tablet Take 1 tablet (5 mg total) by mouth at bedtime as needed for muscle spasms. 08/06/23   Anabel Halon, MD  diphenhydrAMINE-zinc acetate (BENADRYL EXTRA STRENGTH) cream Apply 1 application topically 3 (three) times daily as needed for itching. 12/07/21   Donell Beers, FNP  docusate sodium (COLACE) 100 MG capsule Take 100 mg by mouth 2 (two) times daily.    [provider]  DULoxetine (CYMBALTA) 30 MG capsule Take 60 mg by mouth daily.    [provider]  dupilumab (DUPIXENT) 300 MG/2ML prefilled syringe Inject 300 mg into the skin every 14 (fourteen) days. Starting at day 15 for maintenance. 01/14/23   Willeen Niece, MD  fenofibrate (TRICOR) 145 MG tablet Take 1 tablet (145 mg total) by mouth daily. 01/21/23   Marykay Lex, MD  Fluocinolone Acetonide 0.01 % OIL Use 1 - 2 drop qd/bid prn  for scale at ears 01/15/23   Willeen Niece, MD  fluocinonide (LIDEX) 0.05 % external solution Apply once or twice daily to affected areas on scalp up to 2 weeks as needed for itching. Avoid applying to face, groin, and axilla. 06/11/22   Willeen Niece, MD  fluticasone [facility] ALLERGY RELIEF) 50 MCG/ACT nasal spray Place 2 sprays into both nostrils daily as needed for allergies or rhinitis. Patient taking differently: Place 2 sprays into both nostrils daily. 09/26/21   Anabel Halon, MD  gabapentin (NEURONTIN) 300 MG capsule Take 300 mg by mouth 3 (three) times daily.    [provider]  guaiFENesin (MUCINEX) 600 MG 12 hr tablet Take by mouth. 05/10/22   [provider]  hydrOXYzine (VISTARIL) 25 MG capsule Take 1 capsule (25 mg total) by mouth every 8 (eight) hours as needed. Take before bed as needed for itchy. Can cause drowsiness. 10/14/22   Willeen Niece, MD  ketoconazole (NIZORAL) 2 % shampoo Apply 1 Application topically daily as needed for irritation. 06/11/22   Willeen Niece, MD  lubiprostone (AMITIZA) 8 MCG capsule Take 1 capsule (8 mcg total) by mouth 2 (two) times daily with a meal. 08/06/23   Allena Katz, Earlie Lou, MD  montelukast (SINGULAIR) 10 MG tablet TAKE 1 TABLET BY MOUTH AT BEDTIME 07/16/23   Martina Sinner, MD  naproxen (NAPROSYN) 500 MG tablet Take 500 mg by mouth 2 (two) times daily. 08/23/22   [provider]  NEEDLE, DISP, 18 G 18G X 1-1/2" MISC Use to draw up testosterone for injection 04/18/23   Bjorn Pippin, MD  omega-3 acid ethyl esters (LOVAZA) 1 g capsule Take 2 capsules (2 g total) by mouth 2 (two) times daily. 11/27/21   Anabel Halon, MD  omeprazole (PRILOSEC) 40 MG capsule Take 1 capsule by mouth twice daily 07/25/23   Marguerita Merles, Reuel Boom, MD  ondansetron (ZOFRAN) 4 MG tablet Take 1 tablet (4 mg total) by mouth every 6 (six) hours as needed for nausea. 06/04/21   Bing Neighbors, NP  potassium gluconate (RA POTASSIUM GLUCONATE) 595 (99 K) MG TABS  tablet Take 1 tablet by mouth daily. 01/30/22   [provider]  propranolol (INDERAL) 20 MG tablet Take one tablet 20 mg  twice daily  may take an  additional dose of 20 mg  as needed for palpitations 03/25/22   Marykay Lex, MD  rosuvastatin (CRESTOR) 40 MG tablet Take 1 tablet (40 mg total) by mouth daily. 01/01/23   Lewayne Bunting, MD  senna (SENOKOT) 8.6 MG tablet Take 1 tablet by mouth daily. prn    [provider]  SYRINGE-NEEDLE, DISP, 3 ML 22G X 1-1/2" 3 ML MISC Use to inject testosterone 04/18/23   Bjorn Pippin, MD  tacrolimus (PROTOPIC) 0.1 % ointment APPLY TOPICALLY TO AFFECTED AREAS OF BODY FOR RASH DAILY OR TWICE DAILY AS NEEDED 10/07/22  Willeen Niece, MD  Testosterone 25 MG/2.5GM (1%) GEL Place 2.5 g onto the skin in the morning. 07/10/23   Bjorn Pippin, MD  triamcinolone cream (KENALOG) 0.1 %  03/07/22   [provider]  Upadacitinib ER (RINVOQ) 15 MG TB24 Take 1 tablet (15 mg total) by mouth daily. 06/12/23   Willeen Niece, MD      Allergies    Gramineae pollens, Claritin [loratadine], Loratadine-pseudoephedrine er, and Pollen extract    Review of Systems   Review of Systems  Gastrointestinal:  Positive for abdominal pain.  All other systems reviewed and are negative.   Physical Exam Updated Vital Signs BP (!) 141/91 (BP Location: Right Arm)   Pulse 72   Temp 98.1 F (36.7 C) (Oral)   Resp 16   Ht 6\' 3"  (1.905 m)   Wt 122.5 kg   SpO2 97%   BMI 33.75 kg/m  Physical Exam Vitals and nursing note reviewed.  Constitutional:      General: He is not in acute distress.    Appearance: He is well-developed.  HENT:     Head: Normocephalic and atraumatic.  Eyes:     Conjunctiva/sclera: Conjunctivae normal.  Cardiovascular:     Rate and Rhythm: Normal rate and regular rhythm.     Pulses: Normal pulses.     Heart sounds: No murmur heard. Pulmonary:     Effort: Pulmonary effort is normal. No respiratory distress.     Breath sounds: Normal  breath sounds. No wheezing, rhonchi or rales.  Abdominal:     Palpations: Abdomen is soft.     Tenderness: There is no abdominal tenderness.  Musculoskeletal:        General: No swelling.     Cervical back: Neck supple.  Skin:    General: Skin is warm and dry.     Capillary Refill: Capillary refill takes less than 2 seconds.  Neurological:     Mental Status: He is alert.  Psychiatric:        Mood and Affect: Mood normal.     ED Results / Procedures / Treatments   Labs (all labs ordered are listed, but only abnormal results are displayed) Labs Reviewed  COMPREHENSIVE METABOLIC PANEL - Abnormal; Notable for the following components:      Result Value   Glucose, Bld 109 (*)    Creatinine, Ser 1.66 (*)    AST 54 (*)    ALT 90 (*)    GFR, Estimated 52 (*)    All other components within normal limits  CBC WITH DIFFERENTIAL/PLATELET - Abnormal; Notable for the following components:   Abs Immature Granulocytes 0.08 (*)    All other components within normal limits  LIPASE, BLOOD  URINALYSIS, ROUTINE W REFLEX MICROSCOPIC  TROPONIN I (HIGH SENSITIVITY)    EKG None  Radiology CT ABDOMEN PELVIS W CONTRAST  Result Date: 08/08/2023 CLINICAL DATA:  Right upper quadrant pain and swelling for 3 months EXAM: CT ABDOMEN AND PELVIS WITH CONTRAST TECHNIQUE: Multidetector CT imaging of the abdomen and pelvis was performed using the standard protocol following bolus administration of intravenous contrast. RADIATION DOSE REDUCTION: This exam was performed according to the departmental dose-optimization program which includes automated exposure control, adjustment of the mA and/or kV according to patient size and/or use of iterative reconstruction technique. CONTRAST:  80mL OMNIPAQUE IOHEXOL 300 MG/ML  SOLN COMPARISON:  CT 08/01/2023 FINDINGS: Lower chest: Minimal scarring at the lung bases. Hepatobiliary: Hepatic steatosis. No calcified gallstone or biliary dilatation. Pancreas: Unremarkable. No  pancreatic ductal dilatation or surrounding inflammatory changes. Spleen: Normal in size without focal abnormality. Adrenals/Urinary Tract: Adrenal glands are unremarkable. Kidneys are normal, without renal calculi, focal lesion, or hydronephrosis. Bladder is unremarkable. Stomach/Bowel: Stomach is within normal limits. Appendix appears normal. No evidence of bowel wall thickening, distention, or inflammatory changes. Vascular/Lymphatic: Diverticular disease of the colon without acute wall thickening. Reproductive: Prostate is unremarkable. Other: Negative for pelvic effusion or free air. Musculoskeletal: No acute or suspicious osseous abnormality. IMPRESSION: 1. No CT evidence for acute intra-abdominal or pelvic abnormality. 2. Hepatic steatosis. 3. Diverticular disease of the colon without acute wall thickening. Electronically Signed   By: Jasmine Pang M.D.   On: 08/08/2023 16:21    Procedures Procedures    Medications Ordered in ED Medications  dicyclomine (BENTYL) injection 20 mg (20 mg Intramuscular Given 08/08/23 1335)  iohexol (OMNIPAQUE) 300 MG/ML solution 100 mL (80 mLs Intravenous Contrast Given 08/08/23 1402)    ED Course/ Medical Decision Making/ A&P                                 Medical Decision Making Amount and/or Complexity of Data Reviewed Labs: ordered. Radiology: ordered.  Risk Prescription drug management.   This patient presents to the ED for concern of abdominal pain, this involves an extensive number of treatment options, and is a complaint that carries with it a high risk of complications and morbidity.  The differential diagnosis includes gastritis, PUD, cholecystitis, CBD pathology, SBO/LBO, diverticulitis, appendicitis, aortic dissection, AAA, other   Co morbidities that complicate the patient evaluation  See HPI   Additional history obtained:  Additional history obtained from EMR External records from outside source obtained and reviewed including  hospital records   Lab Tests:  I Ordered, and personally interpreted labs.  The pertinent results include: No leukocytosis.  No evidence of anemia.  Placed into range.  No electrolyte abnormalities.  Mild transaminitis with AST of 54 and ALT of 90 of which seems to be near patient's baseline.  Patient baseline renal dysfunction with creatinine 1.66, BUN of 19 and GFR 52.  UA without abnormality.  Troponin negative.  Lipase within normal limits.   Imaging Studies ordered:  I ordered imaging studies including CT abdomen pelvis I independently visualized and interpreted imaging which showed no acute abnormality.  Hepatic steatosis.  Diverticulosis. I agree with the radiologist interpretation   Cardiac Monitoring: / EKG:  The patient was maintained on a cardiac monitor.  I personally viewed and interpreted the cardiac monitored which showed an underlying rhythm of: Sinus rhythm   Consultations Obtained:  N/a   Problem List / ED Course / Critical interventions / Medication management  Right upper abdominal pain I ordered medication including Bentyl   Reevaluation of the patient after these medicines showed that the patient improved I have reviewed the patients home medicines and have made adjustments as needed   Social Determinants of Health:  Former cigarette use.  Denies illicit drug use.   Test / Admission - Considered:  Right upper abdominal pain Vitals signs within normal range and stable throughout visit. Laboratory/imaging studies significant for: See above 43 year old male presents emergency department with complaints of right upper abdominal pain, right lower chest pain as well as some symptoms of abdominal distention.  On exam, patient with mild tenderness to right upper quadrant with deep palpation but otherwise, benign abdominal exam.  Regarding chest pain, rather vague description of chest  pain.  Symptoms been going on for the past month and 1/2 to 2 months without  any acute change.  Sometimes associated with movement of his upper extremity/torso and sometimes with exertion.  Patient with negative troponin, lack of acute ischemic changes on EKG; low suspicion for ACS.  Patient with low Wells PE score and PERC negative so doubt PE.  Patient without chest/abdominal pain rating to back, pulse deficits, neurologic deficits, hypertension; low suspicion for dissection.  Patient without evidence of pneumothorax, pneumonia, pulmonary vascular congestion or other abnormality on chest x-ray imaging.  Regarding abdominal pain, patient with mild AST and ALT elevation which seems to be chronic issue and being followed by specialist in the outpatient setting.  No acute change on laboratory studies obtained.  CT imaging was obtained which did not show evidence of any acute intra-abdominal pathology.  Unsure of exact etiology of patient's symptoms of abdominal distention/abdominal pain but workup today overall reassuring.  Will recommend continued use of IBS medications as well as continued follow-up with GI in the outpatient setting.  Treatment plan discussed at length with patient and he acknowledged understanding was agreeable to said plan.  Patient overall well-appearing, afebrile in no acute distress. Worrisome signs and symptoms were discussed with the patient, and the patient acknowledged understanding to return to the ED if noticed. Patient was stable upon discharge.          Final Clinical Impression(s) / ED Diagnoses Final diagnoses:  Abdominal pain, unspecified abdominal location    Rx / DC Orders ED Discharge Orders          Ordered    dicyclomine (BENTYL) 20 MG tablet  2 times daily PRN        08/08/23 1708              Peter Garter, [place] 08/08/23 1859    Coral Spikes, DO 08/11/23 [phone]

## 2023-08-08 NOTE — ED Triage Notes (Signed)
Pt with RUQ pain x 1.5 months and abd swelling x 3 months.  Mid right chest pain x 1 month. Denies any N/V/D.

## 2023-08-08 NOTE — Patient Instructions (Signed)
[facility]  Discharge Instructions: Thank you for choosing [facility] to provide your oncology and hematology care.  If you have a lab appointment with the Cancer Center - please note that after April 8th, 2024, all labs will be drawn in the cancer center.  You do not have to check in or register with the main entrance as you have in the past but will complete your check-in in the cancer center.  Wear comfortable clothing and clothing appropriate for easy access to any Portacath or PICC line.   We strive to give you quality time with your provider. You may need to reschedule your appointment if you arrive late (15 or more minutes).  Arriving late affects you and other patients whose appointments are after yours.  Also, if you miss three or more appointments without notifying the office, you may be dismissed from the clinic at the provider's discretion.      For prescription refill requests, have your pharmacy contact our office and allow 72 hours for refills to be completed.    Today you received the following chemotherapy and/or immunotherapy agents Phelbotomy      To help prevent nausea and vomiting after your treatment, we encourage you to take your nausea medication as directed.  BELOW ARE SYMPTOMS THAT SHOULD BE REPORTED IMMEDIATELY: *FEVER GREATER THAN 100.4 F (38 C) OR HIGHER *CHILLS OR SWEATING *NAUSEA AND VOMITING THAT IS NOT CONTROLLED WITH YOUR NAUSEA MEDICATION *UNUSUAL SHORTNESS OF BREATH *UNUSUAL BRUISING OR BLEEDING *URINARY PROBLEMS (pain or burning when urinating, or frequent urination) *BOWEL PROBLEMS (unusual diarrhea, constipation, pain near the anus) TENDERNESS IN MOUTH AND THROAT WITH OR WITHOUT PRESENCE OF ULCERS (sore throat, sores in mouth, or a toothache) UNUSUAL RASH, SWELLING OR PAIN  UNUSUAL VAGINAL DISCHARGE OR ITCHING   Items with * indicate a potential emergency and should be followed up as soon as possible or go to the  Emergency Department if any problems should occur.  Please show the CHEMOTHERAPY ALERT CARD or IMMUNOTHERAPY ALERT CARD at check-in to the Emergency Department and triage nurse.  Should you have questions after your visit or need to cancel or reschedule your appointment, please contact [facility]ENTER AT [facility] [phone]  and follow the prompts.  Office hours are 8:00 a.m. to 4:30 p.m. Monday - Friday. Please note that voicemails left after 4:00 p.m. may not be returned until the following business day.  We are closed weekends and major holidays. You have access to a nurse at all times for urgent questions. Please call the main number to the clinic [phone] and follow the prompts.  For any non-urgent questions, you may also contact your provider using MyChart. We now offer e-Visits for anyone 6 and older to request care online for non-urgent symptoms. For details visit [email or web address].   Also download the MyChart app! Go to the app store, search "MyChart", open the app, select [facility], and log in with your MyChart username and password.

## 2023-08-08 NOTE — ED Notes (Signed)
ED Provider at bedside. 

## 2023-08-08 NOTE — Discharge Instructions (Addendum)
As discussed, laboratory studies reassuring.  CT scan did not show any acute abnormalities.  Recommend continued use of your IBS/GERD medications and closely follow-up with GI. Please do not hesitate to return to emergency department for worrisome signs and symptoms we discussed become apparent.

## 2023-08-09 LAB — PARATHYROID HORMONE, INTACT (NO CA): PTH: 17 pg/mL (ref 15–65)

## 2023-08-09 LAB — ACETYLCHOLINE RECEPTOR, BLOCKING: Acetylchol Block Ab: 8 % (ref 0–25)

## 2023-08-11 ENCOUNTER — Encounter (INDEPENDENT_AMBULATORY_CARE_PROVIDER_SITE_OTHER): Payer: Self-pay

## 2023-08-11 ENCOUNTER — Other Ambulatory Visit (HOSPITAL_COMMUNITY): Payer: Self-pay | Admitting: Nephrology

## 2023-08-11 ENCOUNTER — Telehealth (INDEPENDENT_AMBULATORY_CARE_PROVIDER_SITE_OTHER): Payer: Self-pay | Admitting: Gastroenterology

## 2023-08-11 DIAGNOSIS — J45990 Exercise induced bronchospasm: Secondary | ICD-10-CM | POA: Diagnosis not present

## 2023-08-11 DIAGNOSIS — L309 Dermatitis, unspecified: Secondary | ICD-10-CM | POA: Diagnosis not present

## 2023-08-11 DIAGNOSIS — N529 Male erectile dysfunction, unspecified: Secondary | ICD-10-CM | POA: Diagnosis not present

## 2023-08-11 DIAGNOSIS — Z6833 Body mass index (BMI) 33.0-33.9, adult: Secondary | ICD-10-CM | POA: Diagnosis not present

## 2023-08-11 DIAGNOSIS — I2510 Atherosclerotic heart disease of native coronary artery without angina pectoris: Secondary | ICD-10-CM | POA: Diagnosis not present

## 2023-08-11 DIAGNOSIS — K219 Gastro-esophageal reflux disease without esophagitis: Secondary | ICD-10-CM | POA: Diagnosis not present

## 2023-08-11 DIAGNOSIS — Z833 Family history of diabetes mellitus: Secondary | ICD-10-CM | POA: Diagnosis not present

## 2023-08-11 DIAGNOSIS — E669 Obesity, unspecified: Secondary | ICD-10-CM | POA: Diagnosis not present

## 2023-08-11 DIAGNOSIS — J309 Allergic rhinitis, unspecified: Secondary | ICD-10-CM | POA: Diagnosis not present

## 2023-08-11 DIAGNOSIS — Z809 Family history of malignant neoplasm, unspecified: Secondary | ICD-10-CM | POA: Diagnosis not present

## 2023-08-11 DIAGNOSIS — E785 Hyperlipidemia, unspecified: Secondary | ICD-10-CM | POA: Diagnosis not present

## 2023-08-11 DIAGNOSIS — N1832 Chronic kidney disease, stage 3b: Secondary | ICD-10-CM

## 2023-08-11 DIAGNOSIS — G4733 Obstructive sleep apnea (adult) (pediatric): Secondary | ICD-10-CM | POA: Diagnosis not present

## 2023-08-11 NOTE — Telephone Encounter (Signed)
No PA needed via insurance. Will send my chart message with pre op appt.

## 2023-08-11 NOTE — Telephone Encounter (Signed)
Per Chelsea:can we get him scheduled for upper endoscopy for abdominal bloating and discomfort? ASA III   Pt returned call and scheduled for 08/19/23 with Dr.Castaneda. instructions sent via my chart.

## 2023-08-11 NOTE — Telephone Encounter (Signed)
Spoke with the patient he says his bowels are now moving as Dr. Allena Katz placed him on Amitiza 8 mcg bid he is now having around 3-4 soild stools/bm's per day.He says he still having the bloating and the abdominal pain and he wants something done about it. I explained to him the next step is to do the egd, and I would ask if we can exepdite this. Please advise.

## 2023-08-12 ENCOUNTER — Ambulatory Visit (HOSPITAL_COMMUNITY)
Admission: RE | Admit: 2023-08-12 | Discharge: 2023-08-12 | Disposition: A | Discharge status: Home / Self Care | Payer: 59 | Source: Ambulatory Visit | Attending: Nephrology | Admitting: Nephrology

## 2023-08-12 ENCOUNTER — Encounter (INDEPENDENT_AMBULATORY_CARE_PROVIDER_SITE_OTHER): Payer: Self-pay

## 2023-08-12 ENCOUNTER — Other Ambulatory Visit: Payer: Self-pay | Admitting: Pulmonary Disease

## 2023-08-12 DIAGNOSIS — N1832 Chronic kidney disease, stage 3b: Secondary | ICD-10-CM | POA: Insufficient documentation

## 2023-08-12 DIAGNOSIS — J4530 Mild persistent asthma, uncomplicated: Secondary | ICD-10-CM

## 2023-08-12 DIAGNOSIS — N189 Chronic kidney disease, unspecified: Secondary | ICD-10-CM | POA: Diagnosis not present

## 2023-08-12 NOTE — Telephone Encounter (Signed)
My chart message sent with pre op (08/14/23 @ 2:45pm)

## 2023-08-14 ENCOUNTER — Encounter (HOSPITAL_COMMUNITY)
Admission: RE | Admit: 2023-08-14 | Discharge: 2023-08-14 | Disposition: A | Discharge status: Home / Self Care | Payer: 59 | Source: Ambulatory Visit | Attending: Gastroenterology | Admitting: Gastroenterology

## 2023-08-14 ENCOUNTER — Other Ambulatory Visit: Payer: Self-pay

## 2023-08-14 ENCOUNTER — Encounter (HOSPITAL_COMMUNITY): Payer: Self-pay

## 2023-08-14 HISTORY — DX: Prediabetes: R73.03

## 2023-08-14 NOTE — Pre-Procedure Instructions (Signed)
Attempted pre-op phone call. Left VM for him to call us back. 

## 2023-08-19 ENCOUNTER — Ambulatory Visit (HOSPITAL_BASED_OUTPATIENT_CLINIC_OR_DEPARTMENT_OTHER): Payer: 59 | Admitting: Certified Registered"

## 2023-08-19 ENCOUNTER — Ambulatory Visit (HOSPITAL_COMMUNITY): Payer: 59 | Admitting: Certified Registered"

## 2023-08-19 ENCOUNTER — Ambulatory Visit (HOSPITAL_COMMUNITY)
Admission: RE | Admit: 2023-08-19 | Discharge: 2023-08-19 | Disposition: A | Discharge status: Home / Self Care | Payer: 59 | Attending: Gastroenterology | Admitting: Gastroenterology

## 2023-08-19 ENCOUNTER — Encounter (HOSPITAL_COMMUNITY): Payer: Self-pay | Admitting: Gastroenterology

## 2023-08-19 ENCOUNTER — Encounter (HOSPITAL_COMMUNITY)
Admission: RE | Disposition: A | Discharge status: Home / Self Care | Payer: Self-pay | Source: Home / Self Care | Attending: Gastroenterology

## 2023-08-19 ENCOUNTER — Telehealth: Payer: Self-pay

## 2023-08-19 DIAGNOSIS — K449 Diaphragmatic hernia without obstruction or gangrene: Secondary | ICD-10-CM

## 2023-08-19 DIAGNOSIS — K5750 Diverticulosis of both small and large intestine without perforation or abscess without bleeding: Secondary | ICD-10-CM | POA: Insufficient documentation

## 2023-08-19 DIAGNOSIS — K760 Fatty (change of) liver, not elsewhere classified: Secondary | ICD-10-CM | POA: Insufficient documentation

## 2023-08-19 DIAGNOSIS — F419 Anxiety disorder, unspecified: Secondary | ICD-10-CM | POA: Diagnosis not present

## 2023-08-19 DIAGNOSIS — G4733 Obstructive sleep apnea (adult) (pediatric): Secondary | ICD-10-CM | POA: Insufficient documentation

## 2023-08-19 DIAGNOSIS — K2270 Barrett's esophagus without dysplasia: Secondary | ICD-10-CM | POA: Diagnosis not present

## 2023-08-19 DIAGNOSIS — K219 Gastro-esophageal reflux disease without esophagitis: Secondary | ICD-10-CM | POA: Diagnosis not present

## 2023-08-19 DIAGNOSIS — K5710 Diverticulosis of small intestine without perforation or abscess without bleeding: Secondary | ICD-10-CM | POA: Diagnosis not present

## 2023-08-19 DIAGNOSIS — G4730 Sleep apnea, unspecified: Secondary | ICD-10-CM | POA: Diagnosis not present

## 2023-08-19 DIAGNOSIS — R0789 Other chest pain: Secondary | ICD-10-CM | POA: Insufficient documentation

## 2023-08-19 DIAGNOSIS — K648 Other hemorrhoids: Secondary | ICD-10-CM | POA: Diagnosis not present

## 2023-08-19 DIAGNOSIS — R140 Abdominal distension (gaseous): Secondary | ICD-10-CM

## 2023-08-19 HISTORY — PX: ESOPHAGOGASTRODUODENOSCOPY (EGD) WITH PROPOFOL: SHX5813

## 2023-08-19 HISTORY — PX: BIOPSY: SHX5522

## 2023-08-19 SURGERY — ESOPHAGOGASTRODUODENOSCOPY (EGD) WITH PROPOFOL
Anesthesia: General

## 2023-08-19 MED ORDER — LIDOCAINE HCL (CARDIAC) PF 100 MG/5ML IV SOSY
PREFILLED_SYRINGE | INTRAVENOUS | Status: DC | PRN
  Administered 2023-08-19: 50 mg via INTRAVENOUS

## 2023-08-19 MED ORDER — PROPOFOL 10 MG/ML IV BOLUS
INTRAVENOUS | Status: DC | PRN
  Administered 2023-08-19: 100 mg via INTRAVENOUS

## 2023-08-19 MED ORDER — PROPOFOL 500 MG/50ML IV EMUL
INTRAVENOUS | Status: DC | PRN
  Administered 2023-08-19: 150 ug/kg/min via INTRAVENOUS

## 2023-08-19 MED ORDER — LACTATED RINGERS IV SOLN
INTRAVENOUS | Status: DC | PRN
Start: 2023-08-19 — End: 2023-08-19

## 2023-08-19 NOTE — Telephone Encounter (Signed)
Thanks

## 2023-08-19 NOTE — Anesthesia Procedure Notes (Addendum)
Date/Time: 08/19/2023 10:53 AM  Performed by: Julian Reil, CRNAPre-anesthesia Checklist: Patient identified, Emergency Drugs available, Suction available and Patient being monitored Patient Re-evaluated:Patient Re-evaluated prior to induction Oxygen Delivery Method: Nasal cannula Induction Type: IV induction Placement Confirmation: positive ETCO2 Comments: Optiflow High Flow [place] O2 used.

## 2023-08-19 NOTE — Telephone Encounter (Signed)
Patient called regarding current stomach issues and bloating.  Gastrology stated he needed to contact our office to confirm this couldn't be a side effect from Rinvoq. Please advise.

## 2023-08-19 NOTE — Interval H&P Note (Signed)
History and Physical Interval Note:  08/19/2023 10:43 AM  Elijah Shaffer  has presented today for surgery, with the diagnosis of ABDOMINAL BLOATING/DISCOMFORT.  The various methods of treatment have been discussed with the patient and family. After consideration of risks, benefits and other options for treatment, the patient has consented to  Procedure(s) with comments: ESOPHAGOGASTRODUODENOSCOPY (EGD) WITH PROPOFOL (N/A) - 11:45AM;ASA 3 as a surgical intervention.  The patient's history has been reviewed, patient examined, no change in status, stable for surgery.  I have reviewed the patient's chart and labs.  Questions were answered to the patient's satisfaction.     Katrinka Blazing [place]

## 2023-08-19 NOTE — Discharge Instructions (Signed)
You are being discharged to home.  Implement a low FODMAP diet.  We are waiting for your pathology results.  If negative pathology results, proceed with SIBO breath test.

## 2023-08-19 NOTE — Op Note (Signed)
[facility] Patient Name: Elijah Shaffer Procedure Date: 08/19/2023 10:35 AM MRN: [id number] Date of Birth: [birth year] Attending MD: Katrinka Blazing , , [id number] CSN: [id number] Age: 42 Admit Type: Outpatient Procedure:                Upper GI endoscopy Indications:              Abdominal bloating Providers:                Katrinka Blazing, Angelica Ran, Zena Amos Referring MD:              Medicines:                Monitored Anesthesia Care Complications:            No immediate complications. Estimated Blood Loss:     Estimated blood loss: none. Procedure:                Pre-Anesthesia Assessment:                           - Prior to the procedure, a History and Physical                            was performed, and patient medications, allergies                            and sensitivities were reviewed. The patient's                            tolerance of previous anesthesia was reviewed.                           - The risks and benefits of the procedure and the                            sedation options and risks were discussed with the                            patient. All questions were answered and informed                            consent was obtained.                           - After reviewing the risks and benefits, the                            patient was deemed in satisfactory condition to                            undergo the procedure.                           - ASA Grade Assessment: II - A patient with mild                            systemic disease.  After obtaining informed consent, the endoscope was                            passed under direct vision. Throughout the                            procedure, the patient's blood pressure, pulse, and                            oxygen saturations were monitored continuously. The                            [id number]-[id number] ([id number]) scope was introduced through the                             mouth, and advanced to the third part of duodenum.                            The upper GI endoscopy was accomplished without                            difficulty. The patient tolerated the procedure                            well. Scope In: 10:53:56 AM Scope Out: 11:00:55 AM Total Procedure Duration: 0 hours 6 minutes 59 seconds  Findings:      There were esophageal mucosal changes classified as Barrett's stage       C6-M6 per Prague criteria present in the lower third of the esophagus.       The maximum longitudinal extent of these mucosal changes was 6 cm in       length.      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      The stomach was normal.      The examined duodenum was normal. Biopsies were taken with a cold       forceps for histology.      A medium diverticulum was found in the third portion of the duodenum.      Query if bloating could be related to any of his medications? Impression:               - Esophageal mucosal changes classified as                            Barrett's stage C6-M6 per Prague criteria.                           - 8 cm paraesophageal hernia.                           - Normal stomach.                           - Normal examined duodenum. Biopsied.                           -  Duodenal diverticulum. Moderate Sedation:      Per Anesthesia Care Recommendation:           - Discharge patient to home (ambulatory).                           - Implement a low FODMAP diet.                           - Await pathology results.                           - If negative pathology results, proceed with SIBO                            breath test. Procedure Code(s):        --- Professional ---                           [phone], Esophagogastroduodenoscopy, flexible,                            transoral; with biopsy, single or multiple Diagnosis Code(s):        --- Professional ---                           K22.70, Barrett's esophagus without dysplasia                            K44.9, Diaphragmatic hernia without obstruction or                            gangrene                           R14.0, Abdominal distension (gaseous)                           K57.10, Diverticulosis of small intestine without                            perforation or abscess without bleeding CPT copyright 2022 American Medical Association. All rights reserved. The codes documented in this report are preliminary and upon coder review may  be revised to meet current compliance requirements. Katrinka Blazing, MD Katrinka Blazing,  08/19/2023 11:14:31 AM This report has been signed electronically. Number of Addenda: 0

## 2023-08-19 NOTE — Transfer of Care (Signed)
Immediate Anesthesia Transfer of Care Note  Patient: Elijah Shaffer  Procedure(s) Performed: ESOPHAGOGASTRODUODENOSCOPY (EGD) WITH PROPOFOL BIOPSY  Patient Location: Short Stay  Anesthesia Type:General  Level of Consciousness: drowsy  Airway & Oxygen Therapy: Patient Spontanous Breathing and Patient connected to nasal cannula oxygen  Post-op Assessment: Report given to RN and Post -op Vital signs reviewed and stable  Post vital signs: Reviewed and stable  Last Vitals:  Vitals Value Taken Time  BP    Temp    Pulse    Resp    SpO2      Last Pain:  Vitals:   08/19/23 1045  TempSrc:   PainSc: 0-No pain         Complications: No notable events documented.

## 2023-08-19 NOTE — Anesthesia Preprocedure Evaluation (Signed)
Anesthesia Evaluation  Patient identified by MRN, date of birth, ID band Patient awake    Reviewed: Allergy & Precautions, H&P , NPO status , Patient's Chart, lab work & pertinent test results, reviewed documented beta blocker date and time   Airway Mallampati: II  TM Distance: >3 FB Neck ROM: full    Dental no notable dental hx.    Pulmonary neg pulmonary ROS, sleep apnea , pneumonia   Pulmonary exam normal breath sounds clear to auscultation       Cardiovascular Exercise Tolerance: Good hypertension, + angina  negative cardio ROS  Rhythm:regular Rate:Normal     Neuro/Psych  PSYCHIATRIC DISORDERS Anxiety      Neuromuscular disease negative neurological ROS  negative psych ROS   GI/Hepatic negative GI ROS, Neg liver ROS, hiatal hernia,GERD  ,,(+) Hepatitis -  Endo/Other  negative endocrine ROS    Renal/GU Renal diseasenegative Renal ROS  negative genitourinary   Musculoskeletal   Abdominal   Peds  Hematology negative hematology ROS (+)   Anesthesia Other Findings   Reproductive/Obstetrics negative OB ROS                             Anesthesia Physical Anesthesia Plan  ASA: 2  Anesthesia Plan: General   Post-op Pain Management:    Induction:   PONV Risk Score and Plan: Propofol infusion  Airway Management Planned:   Additional Equipment:   Intra-op Plan:   Post-operative Plan:   Informed Consent: I have reviewed the patients History and Physical, chart, labs and discussed the procedure including the risks, benefits and alternatives for the proposed anesthesia with the patient or authorized representative who has indicated his/her understanding and acceptance.     Dental Advisory Given  Plan Discussed with: CRNA  Anesthesia Plan Comments:        Anesthesia Quick Evaluation

## 2023-08-19 NOTE — Telephone Encounter (Signed)
I mailed Low Fodmap diet to the patient today, and advised to look up a low FodMap diet in the mean time.

## 2023-08-20 ENCOUNTER — Encounter (INDEPENDENT_AMBULATORY_CARE_PROVIDER_SITE_OTHER): Payer: Self-pay | Admitting: *Deleted

## 2023-08-20 ENCOUNTER — Encounter: Payer: Self-pay | Admitting: Internal Medicine

## 2023-08-20 LAB — SURGICAL PATHOLOGY

## 2023-08-20 NOTE — Telephone Encounter (Signed)
Left message for patient to return my call. aw 

## 2023-08-20 NOTE — Telephone Encounter (Signed)
Patient advised to d/c Rinvoq. aw

## 2023-08-22 NOTE — Anesthesia Postprocedure Evaluation (Signed)
Anesthesia Post Note  Patient: Elijah Shaffer  Procedure(s) Performed: ESOPHAGOGASTRODUODENOSCOPY (EGD) WITH PROPOFOL BIOPSY  Patient location during evaluation: Phase II Anesthesia Type: General Level of consciousness: awake Pain management: pain level controlled Vital Signs Assessment: post-procedure vital signs reviewed and stable Respiratory status: spontaneous breathing and respiratory function stable Cardiovascular status: blood pressure returned to baseline and stable Postop Assessment: no headache and no apparent nausea or vomiting Anesthetic complications: no Comments: Late entry   No notable events documented.   Last Vitals:  Vitals:   08/19/23 1013 08/19/23 1106  BP: (!) 139/93 116/66  Pulse: 75 91  Resp: 19 (!) 22  Temp: 36.5 C 36.7 C  SpO2: 91% 91%    Last Pain:  Vitals:   08/19/23 1106  TempSrc: Axillary  PainSc: 0-No pain                 Windell Norfolk

## 2023-08-25 DIAGNOSIS — I129 Hypertensive chronic kidney disease with stage 1 through stage 4 chronic kidney disease, or unspecified chronic kidney disease: Secondary | ICD-10-CM | POA: Diagnosis not present

## 2023-08-25 DIAGNOSIS — Z79899 Other long term (current) drug therapy: Secondary | ICD-10-CM | POA: Diagnosis not present

## 2023-08-25 DIAGNOSIS — K760 Fatty (change of) liver, not elsewhere classified: Secondary | ICD-10-CM | POA: Diagnosis not present

## 2023-08-25 DIAGNOSIS — R809 Proteinuria, unspecified: Secondary | ICD-10-CM | POA: Diagnosis not present

## 2023-08-25 DIAGNOSIS — N189 Chronic kidney disease, unspecified: Secondary | ICD-10-CM | POA: Diagnosis not present

## 2023-08-25 DIAGNOSIS — G4733 Obstructive sleep apnea (adult) (pediatric): Secondary | ICD-10-CM | POA: Diagnosis not present

## 2023-08-25 DIAGNOSIS — E559 Vitamin D deficiency, unspecified: Secondary | ICD-10-CM | POA: Diagnosis not present

## 2023-08-25 DIAGNOSIS — D751 Secondary polycythemia: Secondary | ICD-10-CM | POA: Diagnosis not present

## 2023-08-25 DIAGNOSIS — N1832 Chronic kidney disease, stage 3b: Secondary | ICD-10-CM | POA: Diagnosis not present

## 2023-08-25 LAB — HEMOGLOBIN A1C: Hemoglobin A1C: 6.7

## 2023-08-27 ENCOUNTER — Ambulatory Visit: Payer: Self-pay | Admitting: *Deleted

## 2023-08-27 ENCOUNTER — Encounter (HOSPITAL_COMMUNITY): Payer: Self-pay | Admitting: Gastroenterology

## 2023-08-27 NOTE — Patient Outreach (Addendum)
Care Coordination   Follow Up Visit Note   08/27/2023 Name: Elijah Shaffer MRN: [id number] DOB: Apr 13, 1981  Elijah Shaffer is a 42 y.o. year old male who sees Anabel Halon, MD for primary care. I spoke with  Beverly Gust by phone today.  What matters to the patients health and wellness today?  Abdominal pain makes Still swollen pain only bending shortness of breath Stopped meds  Call nurse & look at   Goals Addressed             This Visit's Progress    Manage abdominal pain, hypertension, obtain eye and dental appointment- RN CM care coordination services   Not on track    Patient will maintain blood pressure Patient will try eating smaller meals   Interventions Today    Flowsheet Row Most Recent Value  Chronic Disease   Chronic disease during today's visit Hypertension (HTN), Other  [hiatal hernia]  General Interventions   General Interventions Discussed/Reviewed General Interventions Reviewed, Communication with, Doctor Visits  Doctor Visits Discussed/Reviewed Doctor Visits Reviewed, PCP, Specialist  PCP/Specialist Visits Compliance with follow-up visit  Communication with PCP/Specialists  [message to C Carlan, GI -request for earlier appointment vs 10/28/23]  Exercise Interventions   Exercise Discussed/Reviewed Exercise Reviewed, Physical Activity  Physical Activity Discussed/Reviewed Physical Activity Reviewed, Home Exercise Program (HEP)  Education Interventions   Education Provided Provided Education  [GERD, hiatal hernia, food choices for GERD, diverticulosis, fatty liver = Hepatic steatosis]  Provided Verbal Education On Nutrition, Medication, Exercise  Mental Health Interventions   Mental Health Discussed/Reviewed Mental Health Reviewed, Coping Strategies  Nutrition Interventions   Nutrition Discussed/Reviewed Nutrition Reviewed  [eating smaller meals]  Pharmacy Interventions   Pharmacy Dicussed/Reviewed Pharmacy Topics Reviewed, Affording Medications               SDOH assessments and interventions completed:  No     Care Coordination Interventions:  Yes, provided   Follow up plan: Follow up call scheduled for 09/10/23    Encounter Outcome:  Patient Visit Completed   Cala Bradford L. Noelle Penner, RN, BSN, [facility]  VBCI Care Management Coordinator  [phone]  Fax: [phone]

## 2023-08-27 NOTE — Patient Instructions (Addendum)
Visit Information  Thank you for taking time to visit with me today. Please don't hesitate to contact me if I can be of assistance to you.   Following are the goals we discussed today:   Goals Addressed             This Visit's Progress    Manage abdominal pain, hypertension, obtain eye and dental appointment- RN CM care coordination services   Not on track    Patient will maintain blood pressure Patient will try eating smaller meals   Interventions Today    Flowsheet Row Most Recent Value  Chronic Disease   Chronic disease during today's visit Hypertension (HTN), Other  [hiatal hernia]  General Interventions   General Interventions Discussed/Reviewed General Interventions Reviewed, Communication with, Doctor Visits  Doctor Visits Discussed/Reviewed Doctor Visits Reviewed, PCP, Specialist  PCP/Specialist Visits Compliance with follow-up visit  Communication with PCP/Specialists  [message to C Carlan, GI -request for earlier appointment vs 10/28/23]  Exercise Interventions   Exercise Discussed/Reviewed Exercise Reviewed, Physical Activity  Physical Activity Discussed/Reviewed Physical Activity Reviewed, Home Exercise Program (HEP)  Education Interventions   Education Provided Provided Education  [GERD, hiatal hernia, food choices for GERD, diverticulosis, fatty liver = Hepatic steatosis]  Provided Verbal Education On Nutrition, Medication, Exercise  Mental Health Interventions   Mental Health Discussed/Reviewed Mental Health Reviewed, Coping Strategies  Nutrition Interventions   Nutrition Discussed/Reviewed Nutrition Reviewed  [eating smaller meals]  Pharmacy Interventions   Pharmacy Dicussed/Reviewed Pharmacy Topics Reviewed, Affording Medications              Our next appointment is by telephone on 09/10/23 at 0900  Please call the care guide team at [phone] if you need to cancel or reschedule your appointment.   If you are experiencing a Mental Health or  Behavioral Health Crisis or need someone to talk to, please call the Suicide and Crisis Lifeline: 988 call the [place] National Suicide Prevention Lifeline: 601-794-0480 or TTY: 309-556-6382 TTY (367) 358-3159) to talk to a trained counselor call 1-800-273-TALK (toll free, 24 hour hotline) call the [facility]: 810 821 9364 call 911   Patient verbalizes understanding of instructions and care plan provided today and agrees to view in MyChart. Active MyChart status and patient understanding of how to access instructions and care plan via MyChart confirmed with patient.     The patient has been provided with contact information for the care management team and has been advised to call with any health related questions or concerns.   Dorine Duffey L. Noelle Penner, RN, BSN, [facility]  VBCI Care Management Coordinator  [phone]  Fax: [phone]

## 2023-08-27 NOTE — Telephone Encounter (Signed)
Returned patient's call, he is aware of new appt time.

## 2023-08-27 NOTE — Patient Outreach (Signed)
Care Coordination   08/27/2023 Name: Elijah Shaffer MRN: [id number] DOB: 05/21/1981   Care Coordination Outreach Attempts:  An unsuccessful telephone outreach was attempted today to offer the patient information about available care coordination services.  Follow Up Plan:  Additional outreach attempts will be made to offer the patient care coordination information and services.   Encounter Outcome:  No Answer   Care Coordination Interventions:  No, not indicated    Tazia Illescas L. Noelle Penner, RN, BSN, [facility]  VBCI Care Management Coordinator  [phone]  Fax: [phone]

## 2023-09-08 ENCOUNTER — Other Ambulatory Visit: Payer: Self-pay | Admitting: Internal Medicine

## 2023-09-08 DIAGNOSIS — I10 Essential (primary) hypertension: Secondary | ICD-10-CM

## 2023-09-09 ENCOUNTER — Ambulatory Visit (INDEPENDENT_AMBULATORY_CARE_PROVIDER_SITE_OTHER): Payer: 59 | Admitting: Dermatology

## 2023-09-09 ENCOUNTER — Encounter: Payer: Self-pay | Admitting: Dermatology

## 2023-09-09 ENCOUNTER — Other Ambulatory Visit: Payer: Self-pay | Admitting: Dermatology

## 2023-09-09 DIAGNOSIS — L720 Epidermal cyst: Secondary | ICD-10-CM

## 2023-09-09 DIAGNOSIS — L2089 Other atopic dermatitis: Secondary | ICD-10-CM

## 2023-09-09 DIAGNOSIS — L729 Follicular cyst of the skin and subcutaneous tissue, unspecified: Secondary | ICD-10-CM

## 2023-09-09 MED ORDER — HYDROXYZINE PAMOATE 25 MG PO CAPS: ORAL_CAPSULE | ORAL | 2 refills | Status: DC

## 2023-09-09 NOTE — Progress Notes (Signed)
   Follow-Up Visit   Subjective  Elijah Shaffer is a 42 y.o. male who presents for the following: Atopic Dermatitis with itch all over.  Patient started on Rinvoq 05/21/23. He started having some stomach issues and was advised by gastrology to contact us regarding this issue. Patient was advised by our office to d/c Rinvoq on 10/8 although he is still having stomach issues off med. He has appointment with gastroenterology 10/28/23. Patient has been using clobetasol 0.05% cream as needed and is taking hydroxyzine for itch but needs refills.   The following portions of the chart were reviewed this encounter and updated as appropriate: medications, allergies, medical history  Review of Systems:  No other skin or systemic complaints except as noted in HPI or Assessment and Plan.  Objective  Well appearing patient in no apparent distress; mood and affect are within normal limits.  Areas Examined: Arms, ears, face, legs  Relevant physical exam findings are noted in the Assessment and Plan.    Assessment & Plan   Other atopic dermatitis  Related Medications clobetasol (TEMOVATE) 0.05 % external solution Apply 1 application. topically 2 (two) times daily. Use as directed.  Mix with cerave cream. Use for up to 4 weeks. Avoid applying to face, groin, and axilla. Use as directed.  hydrOXYzine (VISTARIL) 25 MG capsule Take before bed as needed for itchy. Can cause drowsiness.  EPIDERMAL INCLUSION CYST Exam: Subcutaneous nodule at left postauricular ear  Benign-appearing. Exam most consistent with an epidermal inclusion cyst. Discussed that a cyst is a benign growth that can grow over time and sometimes get irritated or inflamed. Recommend observation if it is not bothersome. Discussed option of surgical excision to remove it if it is growing, symptomatic, or other changes noted. Please call for new or changing lesions so they can be evaluated.   ATOPIC DERMATITIS Exam: pink scaly patch at  right elbow with excoriations, pt says itches all over off and on.   Chronic and persistent condition with duration or expected duration over one year. Condition is symptomatic/ bothersome to patient. Not currently at goal.   Atopic dermatitis (eczema) is a chronic, relapsing, pruritic condition that can significantly affect quality of life. It is often associated with allergic rhinitis and/or asthma and can require treatment with topical medications, phototherapy, or in severe cases biologic injectable medication (Dupixent; Adbry) or Oral JAK inhibitors.  Treatment Plan: Pt was controlled on Rinvoq but has stopped treatment due to abdominal pain/discomfort symptoms.  Symptoms have not improved since stopping medication.  Doubt abdominal symptoms are caused by Rinvoq. Discussed starting Adbry injections, but due to stomach issues patient defers today.  Continue clobetasol 0.05% cream 1-2 times daily as needed for itch at body. Avoid applying to face, groin, and axilla. Use as directed. Long-term use can cause thinning of the skin. Continue hydroxyzine 25 mg at bedtime as needed for itch. Can cause drowsiness.  Continue ketoconazole 2% shampoo apply two to three times per week, massage into scalp and leave in for 5 minutes before rinsing out   Recommend gentle skin care.  Will send referral to [facility] for persistent abdominal pain/discomfort.  Return for Dermatitis 3-4 months.  Anise Salvo, RMA, am acting as scribe for Willeen Niece, MD .   Documentation: I have reviewed the above documentation for accuracy and completeness, and I agree with the above.  Willeen Niece, MD

## 2023-09-09 NOTE — Patient Instructions (Addendum)
Treatment Plan: Discussed Adbry but due to stomach issues patient defers today.  Continue clobetasol 0.05% cream 1-2 times daily as needed for itch at body. Avoid applying to face, groin, and axilla. Use as directed. Long-term use can cause thinning of the skin. Continue hydroxyzine 25 mg at bedtime as needed for itch. Can cause drowsiness.  Continue ketoconazole 2% shampoo apply two to three times per week, massage into scalp and leave in for 5 minutes before rinsing out.  Topical steroids (such as triamcinolone, fluocinolone, fluocinonide, mometasone, clobetasol, halobetasol, betamethasone, hydrocortisone) can cause thinning and lightening of the skin if they are used for too long in the same area. Your physician has selected the right strength medicine for your problem and area affected on the body. Please use your medication only as directed by your physician to prevent side effects.   Due to recent changes in healthcare laws, you may see results of your pathology and/or laboratory studies on MyChart before the doctors have had a chance to review them. We understand that in some cases there may be results that are confusing or concerning to you. Please understand that not all results are received at the same time and often the doctors may need to interpret multiple results in order to provide you with the best plan of care or course of treatment. Therefore, we ask that you please give [place] 2 business days to thoroughly review all your results before contacting the office for clarification. Should we see a critical lab result, you will be contacted sooner.   If You Need Anything After Your Visit  If you have any questions or concerns for your doctor, please call our main line at [phone] and press option 4 to reach your doctor's medical assistant. If no one answers, please leave a voicemail as directed and we will return your call as soon as possible. Messages left after 4 pm will be answered the  following business day.   You may also send [place] a message via MyChart. We typically respond to MyChart messages within 1-2 business days.  For prescription refills, please ask your pharmacy to contact our office. Our fax number is [phone].  If you have an urgent issue when the clinic is closed that cannot wait until the next business day, you can page your doctor at the number below.    Please note that while we do our best to be available for urgent issues outside of office hours, we are not available 24/7.   If you have an urgent issue and are unable to reach [place], you may choose to seek medical care at your doctor's office, retail clinic, urgent care center, or emergency room.  If you have a medical emergency, please immediately call 911 or go to the emergency department.  Pager Numbers  - Dr. Gwen Pounds: [phone]  - Dr. Roseanne Reno: [phone]  - Dr. Katrinka Blazing: [phone]   In the event of inclement weather, please call our main line at [phone] for an update on the status of any delays or closures.  Dermatology Medication Tips: Please keep the boxes that topical medications come in in order to help keep track of the instructions about where and how to use these. Pharmacies typically print the medication instructions only on the boxes and not directly on the medication tubes.   If your medication is too expensive, please contact our office at [phone] option 4 or send [place] a message through MyChart.   We are unable to tell what your co-pay  for medications will be in advance as this is different depending on your insurance coverage. However, we may be able to find a substitute medication at lower cost or fill out paperwork to get insurance to cover a needed medication.   If a prior authorization is required to get your medication covered by your insurance company, please allow [place] 1-2 business days to complete this process.  Drug prices often vary depending on where the  prescription is filled and some pharmacies may offer cheaper prices.  The website www.goodrx.com contains coupons for medications through different pharmacies. The prices here do not account for what the cost may be with help from insurance (it may be cheaper with your insurance), but the website can give you the price if you did not use any insurance.  - You can print the associated coupon and take it with your prescription to the pharmacy.  - You may also stop by our office during regular business hours and pick up a GoodRx coupon card.  - If you need your prescription sent electronically to a different pharmacy, notify our office through [facility] or by phone at [phone] option 4.

## 2023-09-10 ENCOUNTER — Ambulatory Visit: Payer: Self-pay | Admitting: *Deleted

## 2023-09-10 ENCOUNTER — Other Ambulatory Visit: Payer: Self-pay

## 2023-09-10 DIAGNOSIS — R109 Unspecified abdominal pain: Secondary | ICD-10-CM

## 2023-09-10 NOTE — Patient Outreach (Signed)
Care Coordination   09/10/2023 Name: Elijah Shaffer MRN: [id number] DOB: [birth year]   Care Coordination Outreach Attempts:  An unsuccessful telephone outreach was attempted today to offer the patient information about available care coordination services.  Follow Up Plan:  Additional outreach attempts will be made to offer the patient care coordination information and services.   Encounter Outcome:  No Answer   Care Coordination Interventions:  No, not indicated    SIG Timber Marshman L. Noelle Penner, RN, BSN, [facility]  VBCI Care Management Coordinator  [phone]  Fax: [phone]

## 2023-09-10 NOTE — Patient Outreach (Signed)
Care Coordination   09/10/2023 Name: Elijah Shaffer MRN: [id number] DOB: [birth year]   Care Coordination Outreach Attempts:  An unsuccessful telephone outreach was attempted today to offer the patient information about available care coordination services.  Follow Up Plan:  Additional outreach attempts will be made to offer the patient care coordination information and services.   Encounter Outcome:  No Answer   Care Coordination Interventions:  No, not indicated     Jerl Munyan L. Noelle Penner, RN, BSN, [facility]  VBCI Care Management Coordinator  [phone]  Fax: [phone]

## 2023-09-24 ENCOUNTER — Other Ambulatory Visit: Payer: Self-pay | Admitting: Internal Medicine

## 2023-09-24 ENCOUNTER — Emergency Department (HOSPITAL_COMMUNITY)
Admission: EM | Admit: 2023-09-24 | Discharge: 2023-09-24 | Disposition: A | Discharge status: Home / Self Care | Payer: 59 | Attending: Emergency Medicine | Admitting: Emergency Medicine

## 2023-09-24 ENCOUNTER — Telehealth (INDEPENDENT_AMBULATORY_CARE_PROVIDER_SITE_OTHER): Payer: Self-pay | Admitting: *Deleted

## 2023-09-24 ENCOUNTER — Other Ambulatory Visit: Payer: Self-pay

## 2023-09-24 ENCOUNTER — Telehealth: Payer: Self-pay | Admitting: Internal Medicine

## 2023-09-24 ENCOUNTER — Encounter (INDEPENDENT_AMBULATORY_CARE_PROVIDER_SITE_OTHER): Payer: Self-pay

## 2023-09-24 ENCOUNTER — Telehealth (INDEPENDENT_AMBULATORY_CARE_PROVIDER_SITE_OTHER): Payer: Self-pay | Admitting: Gastroenterology

## 2023-09-24 ENCOUNTER — Encounter: Payer: Self-pay | Admitting: Internal Medicine

## 2023-09-24 ENCOUNTER — Ambulatory Visit: Payer: Self-pay | Admitting: Internal Medicine

## 2023-09-24 ENCOUNTER — Encounter (HOSPITAL_COMMUNITY): Payer: Self-pay | Admitting: *Deleted

## 2023-09-24 DIAGNOSIS — Z7982 Long term (current) use of aspirin: Secondary | ICD-10-CM | POA: Diagnosis not present

## 2023-09-24 DIAGNOSIS — I10 Essential (primary) hypertension: Secondary | ICD-10-CM | POA: Diagnosis not present

## 2023-09-24 DIAGNOSIS — K921 Melena: Secondary | ICD-10-CM | POA: Diagnosis not present

## 2023-09-24 DIAGNOSIS — K922 Gastrointestinal hemorrhage, unspecified: Secondary | ICD-10-CM

## 2023-09-24 DIAGNOSIS — R1084 Generalized abdominal pain: Secondary | ICD-10-CM | POA: Diagnosis not present

## 2023-09-24 DIAGNOSIS — K625 Hemorrhage of anus and rectum: Secondary | ICD-10-CM | POA: Diagnosis not present

## 2023-09-24 DIAGNOSIS — Z79899 Other long term (current) drug therapy: Secondary | ICD-10-CM | POA: Insufficient documentation

## 2023-09-24 LAB — CBC WITH DIFFERENTIAL/PLATELET
Abs Immature Granulocytes: 0.08 10*3/uL — ABNORMAL HIGH (ref 0.00–0.07)
Basophils Absolute: 0.1 10*3/uL (ref 0.0–0.1)
Basophils Relative: 1 %
Eosinophils Absolute: 0.1 10*3/uL (ref 0.0–0.5)
Eosinophils Relative: 1 %
HCT: 46.9 % (ref 39.0–52.0)
Hemoglobin: 15.8 g/dL (ref 13.0–17.0)
Immature Granulocytes: 1 %
Lymphocytes Relative: 34 %
Lymphs Abs: 2.6 10*3/uL (ref 0.7–4.0)
MCH: 30.5 pg (ref 26.0–34.0)
MCHC: 33.7 g/dL (ref 30.0–36.0)
MCV: 90.5 fL (ref 80.0–100.0)
Monocytes Absolute: 0.6 10*3/uL (ref 0.1–1.0)
Monocytes Relative: 8 %
Neutro Abs: 4.3 10*3/uL (ref 1.7–7.7)
Neutrophils Relative %: 55 %
Platelets: ADEQUATE 10*3/uL (ref 150–400)
RBC: 5.18 MIL/uL (ref 4.22–5.81)
RDW: 13 % (ref 11.5–15.5)
Smear Review: UNDETERMINED
WBC: 7.8 10*3/uL (ref 4.0–10.5)
nRBC: 0 % (ref 0.0–0.2)

## 2023-09-24 LAB — COMPREHENSIVE METABOLIC PANEL
ALT: 61 U/L — ABNORMAL HIGH (ref 0–44)
AST: 46 U/L — ABNORMAL HIGH (ref 15–41)
Albumin: 4.5 g/dL (ref 3.5–5.0)
Alkaline Phosphatase: 64 U/L (ref 38–126)
Anion gap: 11 (ref 5–15)
BUN: 23 mg/dL — ABNORMAL HIGH (ref 6–20)
CO2: 21 mmol/L — ABNORMAL LOW (ref 22–32)
Calcium: 9.8 mg/dL (ref 8.9–10.3)
Chloride: 106 mmol/L (ref 98–111)
Creatinine, Ser: 1.4 mg/dL — ABNORMAL HIGH (ref 0.61–1.24)
GFR, Estimated: >60 mL/min (ref >60)
Glucose, Bld: 98 mg/dL (ref 70–99)
Potassium: 4.5 mmol/L (ref 3.5–5.1)
Sodium: 138 mmol/L (ref 135–145)
Total Bilirubin: 1 mg/dL (ref <1.2)
Total Protein: 7.5 g/dL (ref 6.5–8.1)

## 2023-09-24 LAB — TYPE AND SCREEN
ABO/RH(D): O POS
Antibody Screen: NEGATIVE

## 2023-09-24 LAB — POC OCCULT BLOOD, ED: Fecal Occult Bld: POSITIVE — AB

## 2023-09-24 NOTE — Telephone Encounter (Signed)
Pt has read myChart message and is aware.

## 2023-09-24 NOTE — Telephone Encounter (Signed)
-----   Message from Eula Listen sent at 09/24/2023  2:11 PM EST -----  dark stool started today entirely stable in the ED.  Dark heme positive stool on DRE.  Hemoglobin very much in the normal range.  I note you recently scoped. Paraesophageal hernia.  SIBO testing planned.   I advised continue PPI, clear liquid diet overnight recheck H&H tomorrow morning.  Early interval follow-up with you.   ED precautions reviewed.  I was just curious if you found a paraesophageal hernia are you sending for surgical repair?    Sounds like he may need a capsule study.    Erwin Nishiyama, lets make sure that this patient has an H&H done tomorrow.

## 2023-09-24 NOTE — Telephone Encounter (Signed)
Patient left vm stating his stools are really black and did not know if he needed to go to ED. I tried to call him back on the number he left [phone] and no answer and no vm set up. He did also send 2 mychart messages.

## 2023-09-24 NOTE — Telephone Encounter (Signed)
Patient came to the ER today, seen by the inpatient team who cleared him for outpatient follow-up.  I discussed with the patient the possibility of undergoing endoscopic evaluation as outpatient.  He agreed with this.  Hi Lauraann Missey,  Can you please schedule a esophagogastroduodenospy with capsule endoscopy deployment? Dx: Melena and unclear source of upper gastrointestinal bleeding. Room: Any.  Please schedule this in soonest slot available.  Thanks,  Katrinka Blazing, MD Gastroenterology and Hepatology [facility] Gastroenterology   Per Availity: Place of Service 22 - On [facility] Service From - To Date NA Admission Type 9 Diagnosis Code 1 [id number] - Melena Diagnosis Code 2 K922 - Gastrointestinal hemorrhage unspecified Procedure Code 1 96045 Quantity 1 Units Procedure From - To Date 2023-09-24 Status NO AUTH REQUIRED Message PRECERT IS NOT REQUIRED OR ONLY REQUIRED IN UNIQUE CIRCUMSTANCES FOR MEDICAID REFER TO PRIOR AUTH TOOL ON [email or web address] FOR ALL OTHERS REFER TO CODE SEARCH TOOL ON [email or web address] COVERAGE OF SERVICES ARE SUBJECT TO BENEFITS AND ELIGIBILITY Procedure Code 2 40981 Quantity 1 Units Procedure From - To Date 2023-09-24 Status NO AUTH REQUIRED Message PRECERT IS NOT REQUIRED OR ONLY REQUIRED IN UNIQUE CIRCUMSTANCES FOR MEDICAID REFER TO PRIOR AUTH TOOL ON [email or web address] FOR ALL OTHERS REFER TO CODE SEARCH TOOL ON [email or web address] COVERAGE OF SERVICES ARE SUBJECT TO BENEFITS AND ELIGIBILITY  Mindy advised me to place the pt on for early morning but your next early morning is end of December. You have an afternoon appt on 10/08/23 at 1:45pm. Would it be ok to but pt on 10/08/23 at 1:45pm? Please advise. Thank you

## 2023-09-24 NOTE — Telephone Encounter (Signed)
Patient came by office stool is black he is not feeling well, he is going to the ED.

## 2023-09-24 NOTE — Telephone Encounter (Deleted)
Copied from CRM [id number]. Topic: Clinical - Medical Advice >> Sep 24, 2023  7:41 AM Elijah Shaffer wrote: Reason for CRM: patient is calling cause bowl or stool is black . Stomach swollen. Wants to know should he go to emergency.   Chief Complaint: Black Tarry Bowels Symptoms: Abdomen Swollen Frequency: Started this morning x 2 times black stool. Pertinent Negatives: Patient denies SOB. Disposition: [x] ED /[] Urgent Care (no appt availability in office) / [] Appointment(In office/virtual)/ []  [place] [facility]/ [] Home Care/ [] Refused Recommended Disposition /[] [place] [facility]/ []  Follow-up with PCP Additional Notes:  Patient does have a GI doctor. Advised patient to call that office to see if he can be seen this morning if no then to go to the Emergency Room.  Reason for Disposition  Black or tarry bowel movements  (Exception: Chronic-unchanged black-grey BMs AND is taking iron pills or Pepto-Bismol.)  Answer Assessment - Initial Assessment Questions 1. APPEARANCE of BLOOD: "What color is it?"Black  Stool  "Is it passed separately, on the surface of the stool, or mixed in with the stool?"  Blood in toilet    2. AMOUNT: "How much blood was passed?"  Large Black stool      3. FREQUENCY: "How many times has blood been passed with the stools?"  2 times      4. ONSET: "When was the blood first seen in the stools?"Today      5. DIARRHEA: "Is there also some diarrhea?" If Yes, ask: "How many diarrhea stools in the past 24 hours?"  Loose Stool after passing large stool      6. CONSTIPATION: "Do you have constipation?" yes If Yes, ask: "How bad is it?" Constipated al the time r/t certain medications      7. RECURRENT SYMPTOMS: "Have you had blood in your stools before?" If Yes, ask: "When was the last time?" and "What happened that time?"    8.Patient takes  Aspirin.     9. OTHER SYMPTOMS: "Do you have any other symptoms?"  Abdomen swelling   Protocols used: Stools - Unusual  Color-A-AH, Rectal Bleeding-A-AH

## 2023-09-24 NOTE — Telephone Encounter (Signed)
Chief Complaint: Black Tarry Bowels Symptoms: Abdomen Swollen Frequency: Started this morning x 2 times black stool. Pertinent Negatives: Patient denies SOB. Disposition: [x] ED /[] Urgent Care (no appt availability in office) / [] Appointment(In office/virtual)/ []  [place] [facility]/ [] Home Care/ [] Refused Recommended Disposition /[] [place] [facility]/ []  Follow-up with PCP Additional Notes:  Patient does have a GI doctor. Advised patient to call that office to see if he can be seen this morning if no then to go to the Emergency Room.   Reason for Disposition  Black or tarry bowel movements  (Exception: Chronic-unchanged black-grey BMs AND is taking iron pills or Pepto-Bismol.)  Answer Assessment - Initial Assessment Questions 1. APPEARANCE of BLOOD: "What color is it?"Black  Stool  "Is it passed separately, on the surface of the stool, or mixed in with the stool?"  Blood in toilet    2. AMOUNT: "How much blood was passed?"  Large Black stool      3. FREQUENCY: "How many times has blood been passed with the stools?"  2 times      4. ONSET: "When was the blood first seen in the stools?"Today      5. DIARRHEA: "Is there also some diarrhea?" If Yes, ask: "How many diarrhea stools in the past 24 hours?"  Loose Stool after passing large stool      6. CONSTIPATION: "Do you have constipation?" yes If Yes, ask: "How bad is it?" Constipated al the time r/t certain medications      7. RECURRENT SYMPTOMS: "Have you had blood in your stools before?" If Yes, ask: "When was the last time?" and "What happened that time?"    8.Patient takes  Aspirin.     9. OTHER SYMPTOMS: "Do you have any other symptoms?"  Abdomen swelling

## 2023-09-24 NOTE — Telephone Encounter (Deleted)
Reason for Disposition  Black or tarry bowel movements  (Exception: Chronic-unchanged black-grey BMs AND is taking iron pills or Pepto-Bismol.)    Answer Assessment - Initial Assessment Questions 1. APPEARANCE of BLOOD: "What color is it?" Black  "Is it passed separately, on the surface of the stool, or mixed in with the stool?"  Stool is totally black      2. AMOUNT: "How much blood was passed?"  Blood in toilet off the black stool      3. FREQUENCY: "How many times has blood been passed with the stools?"      2 4. ONSET: "When was the blood first seen in the stools?" (Days or weeks)       This  morning 5. DIARRHEA: "Is there also some diarrhea?" If Yes, ask: "How many diarrhea stools in the past 24 hours?"      One episode  6. CONSTIPATION: "Do you have constipation?" If Yes, ask: "How bad is it?"     Yes often related to medication prescribed 7. RECURRENT SYMPTOMS: "Have you had blood in your stools before?" If Yes, ask: "When was the last time?" and "What happened that time?"      Today is the first time.  9. OTHER SYMPTOMS: "Do you have any other symptoms?"  (e.g., abdomen pain, vomiting, dizziness, fever)     Abdomen swelling  Protocols used: Stools - Unusual Color-A-AH, Rectal Bleeding-A-AH

## 2023-09-24 NOTE — ED Provider Notes (Signed)
[place] EMERGENCY DEPARTMENT AT [facility] Provider Note   CSN: [id number] Arrival date & time: 09/24/23  1034     History  Chief Complaint  Patient presents with   Rectal Bleeding    Elijah Shaffer is a 42 y.o. male with past medical history significant for GERD, Barrett's esophagus, hypertension, diverticulosis, hyperlipidemia presents to the ED complaining of dark black stool that began this morning.  Patient reports that he has had abdominal swelling and pain on and off for the past 6 months.  He does see a gastroenterologist, but they were unable to see him today.  He also reports this morning that he felt sweaty and weak.  He does not take a blood thinner.  Denies NSAID use.        Home Medications Prior to Admission medications   Medication Sig Start Date End Date Taking? Authorizing Provider  albuterol (VENTOLIN HFA) 108 (90 Base) MCG/ACT inhaler INHALE 2 PUFFS BY MOUTH EVERY 6 HOURS AS NEEDED FOR SHORTNESS OF BREATH 05/23/23   Anabel Halon, MD  amLODipine (NORVASC) 5 MG tablet Take 1 tablet by mouth once daily 09/08/23   Anabel Halon, MD  aspirin EC 81 MG tablet Take 81 mg by mouth daily. Swallow whole.    [provider]  b complex vitamins capsule Take 1 capsule by mouth daily. With D3 gummie    [provider]  betamethasone dipropionate 0.05 % lotion Apply 1-2 times daily to affected itchy areas at scalp. Avoid applying to face, groin, and axilla. Use as directed. Long-term use can cause thinning of the skin. 01/15/23   Willeen Niece, MD  cetirizine (ZYRTEC) 10 MG tablet Take 10 mg by mouth daily.    [provider]  Cholecalciferol (VITAMIN D-3 PO) Take by mouth daily at 6 (six) AM.    [provider]  clindamycin (CLEOCIN T) 1 % external solution Apply topically to acne bumps at aa's qd 06/17/22   Willeen Niece, MD  clobetasol (TEMOVATE) 0.05 % external solution Apply 1 application. topically 2 (two) times daily. Use  as directed.  Mix with cerave cream. Use for up to 4 weeks. Avoid applying to face, groin, and axilla. Use as directed. 03/21/22   Willeen Niece, MD  clobetasol cream (TEMOVATE) 0.05 % Apply small amount daily before bed to right ankle or other areas of body as needed for itchy rash Avoid applying to face, groin, and axilla Use as directed. 09/16/22   Willeen Niece, MD  cyclobenzaprine (FLEXERIL) 5 MG tablet Take 1 tablet (5 mg total) by mouth at bedtime as needed for muscle spasms. 08/06/23   Anabel Halon, MD  dicyclomine (BENTYL) 20 MG tablet Take 1 tablet (20 mg total) by mouth 2 (two) times daily as needed for spasms. 08/08/23   Peter Garter, PA  diphenhydrAMINE-zinc acetate (BENADRYL EXTRA STRENGTH) cream Apply 1 application topically 3 (three) times daily as needed for itching. 12/07/21   Paseda, Baird Kay, FNP  docusate sodium (COLACE) 100 MG capsule Take 100 mg by mouth 2 (two) times daily.    [provider]  DULoxetine (CYMBALTA) 30 MG capsule Take 60 mg by mouth daily.    [provider]  dupilumab (DUPIXENT) 300 MG/2ML prefilled syringe Inject 300 mg into the skin every 14 (fourteen) days. Starting at day 15 for maintenance. 01/14/23   Willeen Niece, MD  fenofibrate (TRICOR) 145 MG tablet Take 1 tablet (145 mg total) by mouth daily. 01/21/23  Marykay Lex, MD  Fluocinolone Acetonide 0.01 % OIL Use 1 - 2 drop qd/bid prn for scale at ears 01/15/23   Willeen Niece, MD  fluocinonide (LIDEX) 0.05 % external solution Apply once or twice daily to affected areas on scalp up to 2 weeks as needed for itching. Avoid applying to face, groin, and axilla. 06/11/22   Willeen Niece, MD  fluticasone [facility] ALLERGY RELIEF) 50 MCG/ACT nasal spray Place 2 sprays into both nostrils daily as needed for allergies or rhinitis. Patient taking differently: Place 2 sprays into both nostrils daily. 09/26/21   Anabel Halon, MD  gabapentin (NEURONTIN) 300 MG capsule Take 300 mg by mouth 3  (three) times daily.    [provider]  guaiFENesin (MUCINEX) 600 MG 12 hr tablet Take by mouth. 05/10/22   [provider]  hydrOXYzine (VISTARIL) 25 MG capsule TAKE 1 or 2 CAPSULES BY MOUTH BEFORE BED AS NEEDED FOR ITCHING. CAN CAUSE DROWSINESS. 09/09/23   Willeen Niece, MD  ketoconazole (NIZORAL) 2 % shampoo Apply 1 Application topically daily as needed for irritation. 06/11/22   Willeen Niece, MD  lubiprostone (AMITIZA) 8 MCG capsule Take 1 capsule (8 mcg total) by mouth 2 (two) times daily with a meal. 08/06/23   Allena Katz, Earlie Lou, MD  montelukast (SINGULAIR) 10 MG tablet TAKE 1 TABLET BY MOUTH AT BEDTIME 08/12/23   Martina Sinner, MD  naproxen (NAPROSYN) 500 MG tablet Take 500 mg by mouth 2 (two) times daily. 08/23/22   [provider]  NEEDLE, DISP, 18 G 18G X 1-1/2" MISC Use to draw up testosterone for injection 04/18/23   Bjorn Pippin, MD  omega-3 acid ethyl esters (LOVAZA) 1 g capsule Take 2 capsules (2 g total) by mouth 2 (two) times daily. 11/27/21   Anabel Halon, MD  omeprazole (PRILOSEC) 40 MG capsule Take 1 capsule by mouth twice daily 07/25/23   Marguerita Merles, Reuel Boom, MD  ondansetron (ZOFRAN) 4 MG tablet Take 1 tablet (4 mg total) by mouth every 6 (six) hours as needed for nausea. 06/04/21   Bing Neighbors, NP  potassium gluconate (RA POTASSIUM GLUCONATE) 595 (99 K) MG TABS tablet Take 1 tablet by mouth daily. 01/30/22   [provider]  propranolol (INDERAL) 20 MG tablet Take one tablet 20 mg  twice daily  may take an  additional dose of 20 mg  as needed for palpitations 03/25/22   Marykay Lex, MD  rosuvastatin (CRESTOR) 40 MG tablet Take 1 tablet (40 mg total) by mouth daily. 01/01/23   Lewayne Bunting, MD  senna (SENOKOT) 8.6 MG tablet Take 1 tablet by mouth daily. prn    [provider]  SYRINGE-NEEDLE, DISP, 3 ML 22G X 1-1/2" 3 ML MISC Use to inject testosterone 04/18/23   Bjorn Pippin, MD  tacrolimus (PROTOPIC) 0.1 % ointment  APPLY TOPICALLY TO AFFECTED AREAS OF BODY FOR RASH DAILY OR TWICE DAILY AS NEEDED 10/07/22   Willeen Niece, MD  Testosterone 25 MG/2.5GM (1%) GEL Place 2.5 g onto the skin in the morning. 07/10/23   Bjorn Pippin, MD  triamcinolone cream (KENALOG) 0.1 %  03/07/22   [provider]  Upadacitinib ER (RINVOQ) 15 MG TB24 Take 1 tablet (15 mg total) by mouth daily. 06/12/23   Willeen Niece, MD      Allergies    Gramineae pollens, Claritin [loratadine], Loratadine-pseudoephedrine er, and Pollen extract    Review of Systems   Review of Systems  Gastrointestinal:  Positive for  abdominal distention, abdominal pain and blood in stool (melena).    Physical Exam Updated Vital Signs BP (!) 133/91 (BP Location: Left Arm)   Pulse 67   Temp 98.3 F (36.8 C) (Oral)   Resp 18   Ht 6\' 3"  (1.905 m)   Wt 115.7 kg   SpO2 95%   BMI 31.87 kg/m  Physical Exam Vitals and nursing note reviewed. Exam conducted with a chaperone present Haze Justin, RN).  Constitutional:      General: He is not in acute distress.    Appearance: He is not ill-appearing.  HENT:     Mouth/Throat:     Mouth: Mucous membranes are moist.     Pharynx: Oropharynx is clear.  Cardiovascular:     Rate and Rhythm: Normal rate and regular rhythm.  Pulmonary:     Effort: Pulmonary effort is normal. No respiratory distress.     Breath sounds: Normal breath sounds and air entry.  Abdominal:     General: Abdomen is flat. Bowel sounds are normal. There is no distension.     Palpations: Abdomen is soft.     Tenderness: There is generalized abdominal tenderness (mild).  Genitourinary:    Rectum: Guaiac result positive. No tenderness, anal fissure or internal hemorrhoid. Normal anal tone.  Skin:    General: Skin is warm and dry.     Capillary Refill: Capillary refill takes less than 2 seconds.  Neurological:     Mental Status: He is alert. Mental status is at baseline.  Psychiatric:        Mood and Affect: Mood normal.         Behavior: Behavior normal.     ED Results / Procedures / Treatments   Labs (all labs ordered are listed, but only abnormal results are displayed) Labs Reviewed  CBC WITH DIFFERENTIAL/PLATELET - Abnormal; Notable for the following components:      Result Value   Abs Immature Granulocytes 0.08 (*)    All other components within normal limits  COMPREHENSIVE METABOLIC PANEL - Abnormal; Notable for the following components:   CO2 21 (*)    BUN 23 (*)    Creatinine, Ser 1.40 (*)    AST 46 (*)    ALT 61 (*)    All other components within normal limits  POC OCCULT BLOOD, ED - Abnormal; Notable for the following components:   Fecal Occult Bld POSITIVE (*)    All other components within normal limits  TYPE AND SCREEN    EKG None  Radiology No results found.  Procedures Procedures    Medications Ordered in ED Medications - No data to display  ED Course/ Medical Decision Making/ A&P                                 Medical Decision Making Amount and/or Complexity of Data Reviewed Labs: ordered.   This patient presents to the ED with chief complaint(s) of black stools, abdominal discomfort with pertinent past medical history of GERD, Barrett's esophagus.  The complaint involves an extensive differential diagnosis and also carries with it a high risk of complications and morbidity.    The differential diagnosis includes acute upper GI bleed, PUD, esophagitis, gastritis  The initial plan is to obtain labs, hemoccult  Additional history obtained: Records reviewed  gastroenterology - patient is established with Dr. Levon Hedger and has had EGD in the past month.  He has also had previous  CT scans.  He is on 40 mg daily omeprazole.   Initial Assessment:   On exam, patient appears mildly uncomfortable but is not in acute distress.  Abdomen is soft with generalized tenderness.  No obvious distension or overlying skin changes.  No palpable hernias.  Skin is warm and dry.  Rectal exam  performed with RN chaperone present.  Melena appreciated.  Hemoccult positive.  Vitals are stable.    Independent ECG/labs interpretation:  The following labs were independently interpreted:  CBC without leukocytosis or anemia.  Hemoglobin 15.8.  Metabolic panel without acute changes, appears stable when compared to prior.  No major electrolyte abnormalities.    Consultations obtained:   I spoke with on-call gastroenterology provider, Dr. Jena Gauss.  Discussed patient case and workup.  He recommends outpatient follow up with patient's GI, Dr. Levon Hedger, clear liquid diet for the next 24 hours and a repeat H&H tomorrow.   Reached out to patient's PCP, Dr. Trena Platt, via secure chat regarding H&H order.  Dr. Allena Katz has placed the order and patient can have blood work done at PCP office tomorrow.    Disposition:   Patient's workup is overall stable.  Patient is well established with GI and PCP.  He will go to PCP office tomorrow for repeat hemoglobin.  GI will work on arranging outpatient follow up with Dr. Levon Hedger.  Patient provided with information regarding clear liquid diet.  Advised patient to avoid all NSAIDs.    The patient has been appropriately medically screened and/or stabilized in the ED. I have low suspicion for any other emergent medical condition which would require further screening, evaluation or treatment in the ED or require inpatient management. At time of discharge the patient is hemodynamically stable and in no acute distress. I have discussed work-up results and diagnosis with patient and answered all questions. Patient is agreeable with discharge plan. We discussed strict return precautions for returning to the emergency department and they verbalized understanding.             Final Clinical Impression(s) / ED Diagnoses Final diagnoses:  Acute GI bleeding  Melena  Generalized abdominal pain    Rx / DC Orders ED Discharge Orders     None         Lenard Simmer, PA-C 09/24/23 1504    Sloan Leiter, DO 09/25/23 [phone]

## 2023-09-24 NOTE — Discharge Instructions (Addendum)
Thank you for allowing [place] to be a part of your care today.  You were evaluated in the ED for melena (black stool).  You do have blood in your stool which is causing it to turn black.  Your labs and hemoglobin are stable.   We want you to follow a clear liquid diet for the next 24-hours.  I have attached information regarding this diet.  Continue taking your omeprazole as prescribed.  GI will likely reach out to you to schedule outpatient follow up as soon as possible.  You may also call their office if you do not hear from them.    Go to your PCP office tomorrow to have blood work done.  This is to recheck your hemoglobin.   DO NOT take any NSAIDs, such as ibuprofen or naproxen, as this can make bleeding worse.  It is safe for you to take Tylenol.    Return to the ED if you develop sudden worsening of your symptoms, have episodes of fainting or feeling confused, or if you have any new concerns.

## 2023-09-24 NOTE — Telephone Encounter (Signed)
Pt walked in office and crystal spoke with him and advised ED. He is currently at ED

## 2023-09-24 NOTE — Telephone Encounter (Deleted)
Copied from CRM [id number]. Topic: Clinical - Medical Advice >> Sep 24, 2023  7:41 AM Elijah Shaffer wrote: Reason for CRM: patient is calling cause bowl or stool is black . Stomach swollen. Wants to know should he go to emergency.   Chief Complaint: Black Tarry Bowels Symptoms: Abdomen Swollen Frequency: Started this morning x 2 times black stool. Pertinent Negatives: Patient denies SOB. Disposition: [x] ED /[] Urgent Care (no appt availability in office) / [] Appointment(In office/virtual)/ []  [place] [facility]/ [] Home Care/ [] Refused Recommended Disposition /[] [place] [facility]/ []  Follow-up with PCP Additional Notes:  Patient does have a GI doctor. Advised patient to call that office to see if he can be seen this morning if no then to go to the Emergency Room.  Reason for Disposition  Black or tarry bowel movements  (Exception: Chronic-unchanged black-grey BMs AND is taking iron pills or Pepto-Bismol.)  Answer Assessment - Initial Assessment Questions 1. APPEARANCE of BLOOD: "What color is it?"Black  Stool  "Is it passed separately, on the surface of the stool, or mixed in with the stool?"  Blood in toilet    2. AMOUNT: "How much blood was passed?"  Large Black stool      3. FREQUENCY: "How many times has blood been passed with the stools?"  2 times      4. ONSET: "When was the blood first seen in the stools?"Today      5. DIARRHEA: "Is there also some diarrhea?" If Yes, ask: "How many diarrhea stools in the past 24 hours?"  Loose Stool after passing large stool      6. CONSTIPATION: "Do you have constipation?" yes If Yes, ask: "How bad is it?" Constipated al the time r/t certain medications      7. RECURRENT SYMPTOMS: "Have you had blood in your stools before?" If Yes, ask: "When was the last time?" and "What happened that time?"    8.Patient takes  Aspirin.     9. OTHER SYMPTOMS: "Do you have any other symptoms?"  Abdomen swelling  Answer Assessment - Initial Assessment  Questions 1. COLOR: "What color is it?" "Is that color in part or all of the stool?"     *** 2. ONSET: "When was the unusual color first noted?"     *** 3. CAUSE: "Have you eaten any food or taken any medicine of this color?" Note: See listing in Background Information section.      *** 4. OTHER SYMPTOMS: "Do you have any other symptoms?" (e.g., abdomen pain, diarrhea, jaundice, fever).     ***  Protocols used: Stools - Unusual Color-A-AH, Rectal Bleeding-A-AH

## 2023-09-24 NOTE — ED Triage Notes (Signed)
Pt c/o dark black stool that started this morning. Pt reports abdominal swelling and pain on and off x 6 months. Pt sees GI doctor here in [place] but they were unable to see him today. Pt also reports this morning he felt sweaty and weak.

## 2023-09-24 NOTE — Telephone Encounter (Signed)
Patient walked into the office today says he has had severe abdominal pain and dark black stools and says he has sent messages to the office and called several times today to see what we could do for him. I advised that if he is having severe abdominal pain and dark stools and feeling bad he would need to go to the Ed as we can't do the diagnostic testing that he would require. Patient agreed to go to the Ed.

## 2023-09-25 ENCOUNTER — Telehealth (INDEPENDENT_AMBULATORY_CARE_PROVIDER_SITE_OTHER): Payer: Self-pay | Admitting: *Deleted

## 2023-09-25 DIAGNOSIS — K921 Melena: Secondary | ICD-10-CM | POA: Diagnosis not present

## 2023-09-25 NOTE — Telephone Encounter (Signed)
Pt walked in office this morning to report he just had labs done at pcp dr patel this morning and he had another dark stool this morning. Said his eye sight got blurry this morning while at dr patel's office and he told me he felt like he was going to pass out.  I checked vitals.  Bp 114/81 Pulse 76 Temp 97.8 oral  O2 93-95% RA  Pt wanted to see about referral to surgeon for hernia. ( He said you talked to him about that yesterday )

## 2023-09-25 NOTE — Telephone Encounter (Signed)
Referral sent earlier today in AM

## 2023-09-25 NOTE — Telephone Encounter (Signed)
Left message to return call but pt was in office at he time I called (I was unaware pt was in office when I called). Went in and spoke with pt and advised his appt would be on 10/08/23 at 1:45pm and to arrive at 12:15pm. Pt verbalized understanding. Instructions placed on my chart for pt.

## 2023-09-26 ENCOUNTER — Ambulatory Visit: Payer: Self-pay | Admitting: *Deleted

## 2023-09-26 ENCOUNTER — Other Ambulatory Visit: Payer: Self-pay

## 2023-09-26 DIAGNOSIS — D751 Secondary polycythemia: Secondary | ICD-10-CM

## 2023-09-26 LAB — HEMOGLOBIN AND HEMATOCRIT, BLOOD
Hematocrit: 46.2 % (ref 37.5–51.0)
Hemoglobin: 15.2 g/dL (ref 13.0–17.7)

## 2023-09-26 NOTE — Patient Outreach (Signed)
  Care Coordination   Follow Up Visit Note   02/13/2024 updated noted for 09/26/23 Name: Elijah Shaffer MRN: [id number] DOB: 06-Jul-1981  Elijah Shaffer is a 42 y.o. year old male who sees Anabel Halon, MD for primary care. I spoke with  Beverly Gust by phone today.  What matters to the patients health and wellness today?  Nutrition appointment, disability forms assist  try for 2 years T mobile scam  Applied for disability appealed       Goals Addressed             This Visit's Progress    Manage abdominal pain, hypertension, obtain eye and dental appointment- RN CM care coordination services       Interventions Today    Flowsheet Row Most Recent Value  Chronic Disease   Chronic disease during today's visit Hypertension (HTN), Other  [disability, nutrition appointment, monitoring incoming calls]  General Interventions   General Interventions Discussed/Reviewed General Interventions Reviewed, [facility], Doctor Visits  Doctor Visits Discussed/Reviewed Doctor Visits Reviewed, PCP  PCP/Specialist Visits Compliance with follow-up visit  Education Interventions   Education Provided Provided Education  Provided Verbal Education On [facility], Nutrition, When to see the doctor  [place], nutrition]  Mental Health Interventions   Mental Health Discussed/Reviewed Mental Health Reviewed, Coping Strategies  Nutrition Interventions   Nutrition Discussed/Reviewed Nutrition Reviewed, Adding fruits and vegetables, Fluid intake, Portion sizes, Decreasing salt, Decreasing fats  Pharmacy Interventions   Pharmacy Dicussed/Reviewed Pharmacy Topics Reviewed, Affording Medications  Advanced Directive Interventions   Advanced Directives Discussed/Reviewed Advanced Directives Discussed  [declined]              SDOH assessments and interventions completed:  No     Care Coordination Interventions:  Yes, provided   Follow up plan: No further intervention  required.   Encounter Outcome:  Patient Visit Completed   Cala Bradford L. Noelle Penner, RN, BSN, [facility]  VBCI Care Management Coordinator  [phone]  Fax: [phone]

## 2023-09-29 ENCOUNTER — Inpatient Hospital Stay: Payer: 59 | Attending: Hematology

## 2023-09-29 DIAGNOSIS — D751 Secondary polycythemia: Secondary | ICD-10-CM | POA: Insufficient documentation

## 2023-09-29 LAB — CBC WITH DIFFERENTIAL/PLATELET
Abs Immature Granulocytes: 0.07 10*3/uL (ref 0.00–0.07)
Basophils Absolute: 0.1 10*3/uL (ref 0.0–0.1)
Basophils Relative: 1 %
Eosinophils Absolute: 0.1 10*3/uL (ref 0.0–0.5)
Eosinophils Relative: 1 %
HCT: 45.2 % (ref 39.0–52.0)
Hemoglobin: 15 g/dL (ref 13.0–17.0)
Immature Granulocytes: 1 %
Lymphocytes Relative: 30 %
Lymphs Abs: 2.8 10*3/uL (ref 0.7–4.0)
MCH: 30.6 pg (ref 26.0–34.0)
MCHC: 33.2 g/dL (ref 30.0–36.0)
MCV: 92.2 fL (ref 80.0–100.0)
Monocytes Absolute: 0.8 10*3/uL (ref 0.1–1.0)
Monocytes Relative: 9 %
Neutro Abs: 5.4 10*3/uL (ref 1.7–7.7)
Neutrophils Relative %: 58 %
Platelets: 266 10*3/uL (ref 150–400)
RBC: 4.9 MIL/uL (ref 4.22–5.81)
RDW: 13.3 % (ref 11.5–15.5)
WBC: 9.1 10*3/uL (ref 4.0–10.5)
nRBC: 0 % (ref 0.0–0.2)

## 2023-09-29 NOTE — Telephone Encounter (Signed)
scheduled

## 2023-09-30 ENCOUNTER — Ambulatory Visit: Payer: 59 | Admitting: Internal Medicine

## 2023-10-01 ENCOUNTER — Ambulatory Visit (INDEPENDENT_AMBULATORY_CARE_PROVIDER_SITE_OTHER): Payer: 59 | Admitting: Family Medicine

## 2023-10-01 ENCOUNTER — Encounter: Payer: Self-pay | Admitting: Family Medicine

## 2023-10-01 ENCOUNTER — Telehealth: Payer: Self-pay

## 2023-10-01 VITALS — BP 122/88 | HR 87 | Wt 275.1 lb

## 2023-10-01 DIAGNOSIS — F339 Major depressive disorder, recurrent, unspecified: Secondary | ICD-10-CM | POA: Diagnosis not present

## 2023-10-01 DIAGNOSIS — E1122 Type 2 diabetes mellitus with diabetic chronic kidney disease: Secondary | ICD-10-CM | POA: Insufficient documentation

## 2023-10-01 DIAGNOSIS — E559 Vitamin D deficiency, unspecified: Secondary | ICD-10-CM | POA: Insufficient documentation

## 2023-10-01 MED ORDER — VITAMIN D (ERGOCALCIFEROL) 1.25 MG (50000 UNIT) PO CAPS: 50000.0000 [IU] | ORAL_CAPSULE | ORAL | 1 refills | Status: DC

## 2023-10-01 MED ORDER — EMPAGLIFLOZIN 10 MG PO TABS
10.0000 mg | ORAL_TABLET | Freq: Every day | ORAL | 1 refills | Status: DC
Start: 2023-10-01 — End: 2024-01-26

## 2023-10-01 NOTE — Assessment & Plan Note (Addendum)
Referral placed to integrated behavioral health for cognitive behavioral therapy Nonpharmacological management of depression including mindfulness, meditation, deep breathing exercises, adherence to a heart healthy diet and increase physical activity reviewed with the patient Patient and/or legal guardian verbally consented to [facility] services about presenting concerns and psychiatric consultation as appropriate.  The services will be billed as appropriate for the patient

## 2023-10-01 NOTE — Telephone Encounter (Signed)
Awaiting return call

## 2023-10-01 NOTE — Progress Notes (Signed)
Established Patient Office Visit  Subjective:  Patient ID: Elijah Shaffer, male    DOB: October 03, 1981  Age: 42 y.o. MRN: [id number]  CC:  Chief Complaint  Patient presents with   Care Management    Pt reports feeling down, has labs he had done with endo.     HPI Elijah Shaffer is a 42 y.o. male presents for labs to review that was completed by his nephrologist.  Type 2 Diabetes:The patient had labs completed by Dr.Bhauti on 08/25/2023, which showed a hemoglobin A1c of 6.7 and a vitamin D level of 21.3.The patient denies polyuria, polyphagia, and polydipsia.He admits to feeling very fatigued and reports living with his mom. He desires companionship and to have children.He denies suicidal thoughts or ideation and declines starting pharmacological therapy at this time.He is open to speaking with a therapist for cognitive behavioral therapy.    Past Medical History:  Diagnosis Date   Acute renal injury (HCC) 06/28/2017   Anxiety    Aspiration pneumonia of right upper lobe due to gastric secretions (HCC)    Diverticulitis    GERD (gastroesophageal reflux disease) 01/08/2018   Prediabetes    Seasonal allergies    Syncope 06/26/2021    Past Surgical History:  Procedure Laterality Date   BIOPSY  03/18/2021   Procedure: BIOPSY;  Surgeon: Dolores Frame, MD;  [facility];  Service: Gastroenterology;;  esophageal at Z-line   BIOPSY  05/23/2021   Procedure: BIOPSY;  Surgeon: Dolores Frame, MD;  [facility];  Service: Gastroenterology;;   BIOPSY  08/19/2023   Procedure: BIOPSY;  Surgeon: Dolores Frame, MD;  [facility];  Service: Gastroenterology;;   CARDIOPULMONARY EXERCISE TEST (CPX)  01/09/2022   Interpretation limited by submaximal effort.  Severe functional impairment when compared to max sedentary norms.  No cardiopulmonary limitations.  Noted tremor and generalized weakness that lead to premature exercise  termination-consider neuromuscular evaluation.   COLONOSCOPY N/A 09/22/2017   Procedure: COLONOSCOPY;  Surgeon: Malissa Hippo, MD;  [facility];  Service: Endoscopy;  Laterality: N/A;  9:55   COLONOSCOPY WITH PROPOFOL N/A 05/23/2021   Procedure: COLONOSCOPY WITH PROPOFOL;  Surgeon: Dolores Frame, MD;  [facility];  Service: Gastroenterology;  Laterality: N/A;  7:30   ESOPHAGOGASTRODUODENOSCOPY (EGD) WITH PROPOFOL N/A 03/18/2021   Procedure: ESOPHAGOGASTRODUODENOSCOPY (EGD) WITH PROPOFOL;  Surgeon: Dolores Frame, MD;  [facility];  Service: Gastroenterology;  Laterality: N/A;   ESOPHAGOGASTRODUODENOSCOPY (EGD) WITH PROPOFOL N/A 05/23/2021   Procedure: ESOPHAGOGASTRODUODENOSCOPY (EGD) WITH PROPOFOL;  Surgeon: Dolores Frame, MD;  [facility];  Service: Gastroenterology;  Laterality: N/A;   ESOPHAGOGASTRODUODENOSCOPY (EGD) WITH PROPOFOL N/A 08/19/2023   Procedure: ESOPHAGOGASTRODUODENOSCOPY (EGD) WITH PROPOFOL;  Surgeon: Dolores Frame, MD;  [facility];  Service: Gastroenterology;  Laterality: N/A;  11:45AM;ASA 3   POLYPECTOMY  09/22/2017   Procedure: POLYPECTOMY;  Surgeon: Malissa Hippo, MD;  [facility];  Service: Endoscopy;;   RIGHT/LEFT HEART CATH AND CORONARY ANGIOGRAPHY N/A 12/12/2021   Procedure: RIGHT/LEFT HEART CATH AND CORONARY ANGIOGRAPHY;  Surgeon: Dolores Patty, MD;  [facility];  Service: Cardiovascular:: Normal Coronaries & Hemodynamics.  EF 50-55%. RA = 4 mmHg, RV = 23/4; PA = 22/6 (14) & PCW = 7; Ao sat = 95%; PA sat = 76%, 76%& SVC sat = 74%Fick cardiac output/index = 7.1/3.0; PVR = 1.0 WU   TRANSTHORACIC ECHOCARDIOGRAM  02/24/2021  EF 60 to 65%.  Normal LV function.  Normal valves.  Mild RV dilation with normal function.   ZIO PATCH EVENT MONITOR  01/2022   Sinus rhythm with heart rate range 24 to 159 bpm, 1  AVB and Wenckebach block  noted along with rare PACs and PVCs.  No arrhythmias.    Family History  Problem Relation Age of Onset   Healthy Mother    Cervical cancer Mother    Cancer Father    Colon cancer Maternal Grandmother    Colon cancer Maternal Grandfather     Social History   Socioeconomic History   Marital status: Single    Spouse name: Not on file   Number of children: Not on file   Years of education: Not on file   Highest education level: Not on file  Occupational History   Not on file  Tobacco Use   Smoking status: Never    Passive exposure: Past   Smokeless tobacco: Never  Vaping Use   Vaping status: Never Used  Substance and Sexual Activity   Alcohol use: Not Currently   Drug use: No   Sexual activity: Not on file  Other Topics Concern   Not on file  Social History Narrative   He currently has his own lawn care landscaping service. He previous had 80 yards before he got sick and since decreased. He works around a Interior and spatial designer. One of the chemical names is roundup.    Social Determinants of Health   Financial Resource Strain: Not on file  Food Insecurity: No Food Insecurity (06/20/2023)   Hunger Vital Sign    Worried About Running Out of Food in the Last Year: Never true    Ran Out of Food in the Last Year: Never true  Transportation Needs: No Transportation Needs (12/27/2022)   PRAPARE - Administrator, Civil Service (Medical): No    Lack of Transportation (Non-Medical): No  Physical Activity: Insufficiently Active (03/29/2022)   Exercise Vital Sign    Days of Exercise per Week: 2 days    Minutes of Exercise per Session: 10 min  Stress: Stress Concern Present (05/07/2022)   [employer] of Occupational Health - Occupational Stress Questionnaire    Feeling of Stress : To some extent  Social Connections: Not on file  Intimate Partner Violence: Not At Risk (06/20/2023)   Humiliation, Afraid, Rape, and Kick questionnaire    Fear of Current or Ex-Partner: No     Emotionally Abused: No    Physically Abused: No    Sexually Abused: No    Outpatient Medications Prior to Visit  Medication Sig Dispense Refill   albuterol (VENTOLIN HFA) 108 (90 Base) MCG/ACT inhaler INHALE 2 PUFFS BY MOUTH EVERY 6 HOURS AS NEEDED FOR SHORTNESS OF BREATH 9 g 0   amLODipine (NORVASC) 5 MG tablet Take 1 tablet by mouth once daily 90 tablet 0   aspirin EC 81 MG tablet Take 81 mg by mouth daily. Swallow whole.     b complex vitamins capsule Take 1 capsule by mouth daily. With D3 gummie     betamethasone dipropionate 0.05 % lotion Apply 1-2 times daily to affected itchy areas at scalp. Avoid applying to face, groin, and axilla. Use as directed. Long-term use can cause thinning of the skin. 60 mL 2   cetirizine (ZYRTEC) 10 MG tablet Take 10 mg by mouth daily.     Cholecalciferol (VITAMIN D-3 PO) Take by mouth daily at 6 (six) AM.  clindamycin (CLEOCIN T) 1 % external solution Apply topically to acne bumps at aa's qd 30 mL 3   clobetasol (TEMOVATE) 0.05 % external solution Apply 1 application. topically 2 (two) times daily. Use as directed.  Mix with cerave cream. Use for up to 4 weeks. Avoid applying to face, groin, and axilla. Use as directed. 50 mL 1   clobetasol cream (TEMOVATE) 0.05 % Apply small amount daily before bed to right ankle or other areas of body as needed for itchy rash Avoid applying to face, groin, and axilla Use as directed. 30 g 0   cyclobenzaprine (FLEXERIL) 5 MG tablet Take 1 tablet (5 mg total) by mouth at bedtime as needed for muscle spasms. 30 tablet 1   dicyclomine (BENTYL) 20 MG tablet Take 1 tablet (20 mg total) by mouth 2 (two) times daily as needed for spasms. 20 tablet 0   diphenhydrAMINE-zinc acetate (BENADRYL EXTRA STRENGTH) cream Apply 1 application topically 3 (three) times daily as needed for itching. 28.4 g 0   docusate sodium (COLACE) 100 MG capsule Take 100 mg by mouth 2 (two) times daily.     DULoxetine (CYMBALTA) 30 MG capsule Take 60 mg  by mouth daily.     dupilumab (DUPIXENT) 300 MG/2ML prefilled syringe Inject 300 mg into the skin every 14 (fourteen) days. Starting at day 15 for maintenance. 4 mL 5   fenofibrate (TRICOR) 145 MG tablet Take 1 tablet (145 mg total) by mouth daily. 90 tablet 3   Fluocinolone Acetonide 0.01 % OIL Use 1 - 2 drop qd/bid prn for scale at ears 20 mL 2   fluocinonide (LIDEX) 0.05 % external solution Apply once or twice daily to affected areas on scalp up to 2 weeks as needed for itching. Avoid applying to face, groin, and axilla. 60 mL 2   fluticasone (FLONASE ALLERGY RELIEF) 50 MCG/ACT nasal spray Place 2 sprays into both nostrils daily as needed for allergies or rhinitis. (Patient taking differently: Place 2 sprays into both nostrils daily.) 9.9 mL 2   gabapentin (NEURONTIN) 300 MG capsule Take 300 mg by mouth 3 (three) times daily.     guaiFENesin (MUCINEX) 600 MG 12 hr tablet Take by mouth.     hydrOXYzine (VISTARIL) 25 MG capsule TAKE 1 or 2 CAPSULES BY MOUTH BEFORE BED AS NEEDED FOR ITCHING. CAN CAUSE DROWSINESS. 60 capsule 2   ketoconazole (NIZORAL) 2 % shampoo Apply 1 Application topically daily as needed for irritation. 120 mL 11   lubiprostone (AMITIZA) 8 MCG capsule Take 1 capsule (8 mcg total) by mouth 2 (two) times daily with a meal. 60 capsule 3   montelukast (SINGULAIR) 10 MG tablet TAKE 1 TABLET BY MOUTH AT BEDTIME 90 tablet 3   naproxen (NAPROSYN) 500 MG tablet Take 500 mg by mouth 2 (two) times daily.     NEEDLE, DISP, 18 G 18G X 1-1/2" MISC Use to draw up testosterone for injection 2 each 12   omega-3 acid ethyl esters (LOVAZA) 1 g capsule Take 2 capsules (2 g total) by mouth 2 (two) times daily. 120 capsule 3   omeprazole (PRILOSEC) 40 MG capsule Take 1 capsule by mouth twice daily 180 capsule 0   ondansetron (ZOFRAN) 4 MG tablet Take 1 tablet (4 mg total) by mouth every 6 (six) hours as needed for nausea. 20 tablet 0   potassium gluconate (RA POTASSIUM GLUCONATE) 595 (99 K) MG TABS  tablet Take 1 tablet by mouth daily.     propranolol (INDERAL) 20  MG tablet Take one tablet 20 mg  twice daily  may take an  additional dose of 20 mg  as needed for palpitations 190 tablet 3   rosuvastatin (CRESTOR) 40 MG tablet Take 1 tablet (40 mg total) by mouth daily. 90 tablet 3   senna (SENOKOT) 8.6 MG tablet Take 1 tablet by mouth daily. prn     SYRINGE-NEEDLE, DISP, 3 ML 22G X 1-1/2" 3 ML MISC Use to inject testosterone 2 each 12   tacrolimus (PROTOPIC) 0.1 % ointment APPLY TOPICALLY TO AFFECTED AREAS OF BODY FOR RASH DAILY OR TWICE DAILY AS NEEDED 30 g 0   Testosterone 25 MG/2.5GM (1%) GEL Place 2.5 g onto the skin in the morning. 75 g 5   triamcinolone cream (KENALOG) 0.1 %      Upadacitinib ER (RINVOQ) 15 MG TB24 Take 1 tablet (15 mg total) by mouth daily. 30 tablet 5   No facility-administered medications prior to visit.    Allergies  Allergen Reactions   Gramineae Pollens Itching   Claritin [Loratadine] Other (See Comments)    Heart race   Loratadine-Pseudoephedrine Er Palpitations   Pollen Extract Other (See Comments)    Seasonal allergies    ROS Review of Systems  Constitutional:  Negative for fatigue and fever.  Eyes:  Negative for visual disturbance.  Respiratory:  Negative for chest tightness and shortness of breath.   Cardiovascular:  Negative for chest pain and palpitations.  Neurological:  Negative for dizziness and headaches.  Psychiatric/Behavioral:  Negative for suicidal ideas.       Objective:    Physical Exam HENT:     Head: Normocephalic.     Right Ear: External ear normal.     Left Ear: External ear normal.     Nose: No congestion or rhinorrhea.     Mouth/Throat:     Mouth: Mucous membranes are moist.  Cardiovascular:     Rate and Rhythm: Regular rhythm.     Heart sounds: No murmur heard. Pulmonary:     Effort: No respiratory distress.     Breath sounds: Normal breath sounds.  Neurological:     Mental Status: He is alert.     BP  122/88   Pulse 87   Wt 275 lb 1.9 oz (124.8 kg)   SpO2 96%   BMI 34.39 kg/m  Wt Readings from Last 3 Encounters:  10/01/23 275 lb 1.9 oz (124.8 kg)  09/24/23 255 lb (115.7 kg)  08/19/23 270 lb 1 oz (122.5 kg)    Lab Results  Component Value Date   TSH 3.110 12/02/2022   Lab Results  Component Value Date   WBC 9.1 09/29/2023   HGB 15.0 09/29/2023   HCT 45.2 09/29/2023   MCV 92.2 09/29/2023   PLT 266 09/29/2023   Lab Results  Component Value Date   NA 138 09/24/2023   K 4.5 09/24/2023   CO2 21 (L) 09/24/2023   GLUCOSE 98 09/24/2023   BUN 23 (H) 09/24/2023   CREATININE 1.40 (H) 09/24/2023   BILITOT 1.0 09/24/2023   ALKPHOS 64 09/24/2023   AST 46 (H) 09/24/2023   ALT 61 (H) 09/24/2023   PROT 7.5 09/24/2023   ALBUMIN 4.5 09/24/2023   CALCIUM 9.8 09/24/2023   ANIONGAP 11 09/24/2023   EGFR 48 (L) 08/06/2023   GFR 68.05 10/02/2021   Lab Results  Component Value Date   CHOL 128 05/22/2023   Lab Results  Component Value Date   HDL 32 (L) 05/22/2023  Lab Results  Component Value Date   LDLCALC 52 05/22/2023   Lab Results  Component Value Date   TRIG 279 (H) 05/22/2023   Lab Results  Component Value Date   CHOLHDL 4.0 05/22/2023   Lab Results  Component Value Date   HGBA1C 6.1 (H) 03/24/2023      Assessment & Plan:  Type 2 diabetes mellitus with chronic kidney disease, without long-term current use of insulin, unspecified CKD stage [facility]) Assessment & Plan: Will initiate therapy on Jardiance 10 mg daily Advised to decrease his intake of high sugar foods and beverages and increase his physical activity   Orders: -     Empagliflozin; Take 1 tablet (10 mg total) by mouth daily before breakfast.  Dispense: 60 tablet; Refill: 1  Vitamin D deficiency Assessment & Plan: Initiate therapy on weekly vitamin D supplement and encourage increasing his dietary intake of vitamin D rich foods such as salmon, egg yolk, and fortified cereal  Orders: -     Vitamin  D (Ergocalciferol); Take 1 capsule (50,000 Units total) by mouth every 7 (seven) days.  Dispense: 20 capsule; Refill: 1  Depression, recurrent (HCC) Assessment & Plan: Referral placed to integrated behavioral health for cognitive behavioral therapy Nonpharmacological management of depression including mindfulness, meditation, deep breathing exercises, adherence to a heart healthy diet and increase physical activity reviewed with the patient Patient and/or legal guardian verbally consented to [facility] services about presenting concerns and psychiatric consultation as appropriate.  The services will be billed as appropriate for the patient   Orders: -     Amb ref to Integrated Behavioral Health  Note: This chart has been completed using Engineer, civil (consulting) software, and while attempts have been made to ensure accuracy, certain words and phrases may not be transcribed as intended.    Follow-up: Return in about 6 weeks (around 11/12/2023) for anxiety and depression.   Gilmore Laroche, FNP

## 2023-10-01 NOTE — Telephone Encounter (Signed)
Prior auth needed for [place], [place] pending

## 2023-10-01 NOTE — Assessment & Plan Note (Signed)
Will initiate therapy on Jardiance 10 mg daily Advised to decrease his intake of high sugar foods and beverages and increase his physical activity

## 2023-10-01 NOTE — Patient Instructions (Addendum)
I appreciate the opportunity to provide care to you today!    Follow up:  6 weeks  Managing Type 2 Diabetes  You are prescribed Jardiance 10 mg daily to help manage your blood sugar levels. Jardiance (empagliflozin) works by preventing the kidneys from reabsorbing glucose into the blood, allowing excess glucose to be excreted in the urine, which helps to lower blood sugar. Here are some key lifestyle changes to support your treatment plan:  Reduce Intake of High-Sugar Foods and Beverages:  Limit sugary foods and drinks to help keep your blood sugar levels stable. Avoid: Simple carbohydrates: Cakes, sweet desserts, ice cream, candies, cookies, donuts. Sugary beverages: Soda (regular or diet), sweet tea, store-bought juices, lemonade. Artificial sweeteners: Though low-calorie, they may still impact your insulin response. Excessive alcohol: More than 1-2 drinks per day can cause blood sugar fluctuations. Coffee creamers and sugar-free products: Some sugar-free items still affect insulin levels. Increase Consumption of Nutrient-Rich Foods:  Focus on fruits, vegetables, and whole grains that are high in fiber and nutrients. Leafy greens: Spinach, kale, collard greens, etc. Berries: Blueberries, strawberries, raspberries (in moderation). Whole grains: Brown rice, quinoa, whole-wheat pasta, oats. Include healthy fats: Avocados, nuts, seeds, and olive oil in moderation. Choose Lean Proteins:  Opt for lean meats such as chicken, [place], or fish. Beans and legumes like lentils, chickpeas, and kidney beans are great plant-based protein sources. Select Low-Fat Dairy Products:  Choose low-fat or non-fat dairy options such as yogurt, milk, and cheese to support heart health. Minimize Unhealthy Fats:  Reduce saturated fats found in fatty cuts of meat, butter, and fried foods. Avoid trans fats (found in many processed and packaged foods) and limit cholesterol intake (from animal products like fatty  meats and full-fat dairy). Engage in Regular Physical Activity:  Aim for at least 30 minutes of brisk walking or moderate activity 5 days a week. Activities such as swimming, biking, or strength training can also improve blood sugar control. Vitamin D Deficiency Management Since you have Vitamin D deficiency, it's important to follow your healthcare provider's advice to support healthy bones, immune function, and general well-being.  Vitamin D Supplementation: -Start taking your weekly vitamin D supplement as recommended. This will help correct the deficiency and bring your levels back to a healthy range. Foods Rich in Vitamin D:  Fatty fish: Salmon, mackerel, tuna, and sardines are excellent sources. Fortified foods: Many foods are now fortified with Vitamin D, including: Fortified milk (both cow's milk and plant-based milks like almond or soy milk). Fortified orange juice. Fortified cereals. Egg yolks and cheese: These provide small amounts of Vitamin D. Mushrooms: Certain varieties, such as shiitake, maitake, and portobello, contain Vitamin D when exposed to sunlight. Sun Exposure: Aim for moderate sun exposure (about 10-30 minutes per day, depending on your skin tone, geographical location, and time of year) to boost Vitamin D production naturally.  Nonpharmacologic management of depression  Mindfulness and Meditation Practices like mindfulness meditation can help reduce symptoms by promoting relaxation and present-moment awareness.  Exercise  Regular physical activity has been shown to improve mood and reduce anxiety through the release of endorphins and other neurochemicals.  Healthy Diet Eating a balanced diet rich in fruits, vegetables, whole grains, and lean proteins can support overall mental health.  Sleep Hygiene  Establishing a regular sleep routine and ensuring good sleep quality can significantly impact mood and anxiety levels.  Stress Management Techniques Activities  such as yoga, tai chi, and deep breathing exercises can help manage stress.  Social  Support Maintaining strong relationships and seeking support from friends, family, or support groups can provide emotional comfort and reduce feelings of isolation.  Lifestyle Modifications Reducing alcohol and caffeine intake, quitting smoking, and avoiding recreational drugs can improve symptoms.  Art and Music Therapy Engaging in creative activities like painting, drawing, or playing music can be therapeutic and help express emotions.  Light Therapy Particularly useful for seasonal affective disorder (SAD), exposure to bright light can help regulate mood. [name]     Referrals today- Integrated behavioral health for talk therapy   Please continue to a heart-healthy diet and increase your physical activities. Try to exercise for at least five days a week.    It was a pleasure to see you and I look forward to continuing to work together on your health and well-being. Please do not hesitate to call the office if you need care or have questions about your care.  In case of emergency, please visit the Emergency Department for urgent care, or contact our clinic at [phone] to schedule an appointment. We're here to help you!   Have a wonderful day and week. With Gratitude, Gilmore Laroche MSN, FNP-BC

## 2023-10-01 NOTE — Assessment & Plan Note (Signed)
Initiate therapy on weekly vitamin D supplement and encourage increasing his dietary intake of vitamin D rich foods such as salmon, egg yolk, and fortified cereal

## 2023-10-02 ENCOUNTER — Encounter: Payer: Self-pay | Admitting: Family Medicine

## 2023-10-02 ENCOUNTER — Encounter: Payer: Self-pay | Admitting: Oncology

## 2023-10-02 DIAGNOSIS — K449 Diaphragmatic hernia without obstruction or gangrene: Secondary | ICD-10-CM | POA: Diagnosis not present

## 2023-10-03 ENCOUNTER — Inpatient Hospital Stay (HOSPITAL_BASED_OUTPATIENT_CLINIC_OR_DEPARTMENT_OTHER): Payer: 59 | Admitting: Oncology

## 2023-10-03 ENCOUNTER — Ambulatory Visit (INDEPENDENT_AMBULATORY_CARE_PROVIDER_SITE_OTHER): Payer: 59

## 2023-10-03 DIAGNOSIS — Z23 Encounter for immunization: Secondary | ICD-10-CM

## 2023-10-03 DIAGNOSIS — D751 Secondary polycythemia: Secondary | ICD-10-CM

## 2023-10-03 NOTE — Progress Notes (Unsigned)
Virtual Visit via Telephone Note  I connected with Elijah Shaffer on 10/03/23 at  1:00 PM EST by telephone and verified that I am speaking with the correct person using two identifiers.  Location: Patient: Home Provider: Clinic   I discussed the limitations, risks, security and privacy concerns of performing an evaluation and management service by telephone and the availability of in person appointments. I also discussed with the patient that there may be a patient responsible charge related to this service. The patient expressed understanding and agreed to proceed.   History of Present Illness: Mr. Duryee 42 year old male with past medical history significant for erythrocytosis.  He was last seen in clinic on 08/01/2023.  He had low-dose phlebotomy (250 mL) on 08/08/2023 given for extreme fatigue and HA without significant change in his symptoms.   At his last visit, he was having abdominal bloating and pain and shortness of breath.  Chest x-ray was negative for any active cardiopulmonary disease.  Had a CT scan of his abdomen on 08/01/2023 that showed severe hepatic steatosis without evidence of hepatic neoplasm or ascites.  High attenuation sludge or stone in gallbladder neck no evidence of cholecystitis or biliary ductal dilatation.  Large hiatal hernia.  Colonic diverticulosis without radiographic evidence of diverticulitis.   Since his last visit, he has been seen in the emergency room on several occasions most recently on 09/24/23 for rectal bleeding and on 08/08/2023 for abdominal pain.  Additional imaging was obtained and he is following up with gastroenterology.  He has had intermittent rectal bleeding but none recently.  Had a positive Hemoccult on 09/24/2023.  He is scheduled for a colonoscopy/EGD on 10/08/2023.  Reports he will likely have to have hiatal hernia surgery but this is not scheduled yet.  Reports an appetite of 100% energy levels of 25% denies any specific pain although  still having abdominal discomfort.   Observations/Objective:  Review of Systems  Constitutional:  Positive for malaise/fatigue.  Respiratory:  Positive for cough.   Gastrointestinal:  Positive for blood in stool.  Neurological:  Positive for sensory change.  Psychiatric/Behavioral:  The patient has insomnia.    Physical Exam Neurological:     Mental Status: He is alert and oriented to person, place, and time.      Assessment and Plan: 1. Erythrocytosis -Mild erythrocytosis first noted in January 2024 with CBC/D (12/02/2022) showing Hgb 18.1/HCT 52.5%.  Repeat CBC/D (12/23/2022) showed Hgb 18.2/HCT 53.8%. - CTAP (02/23/2021) showed normal-appearing spleen - Non-smoker.   He denies any sources of carbon monoxide exposure.    - No prior history of thrombosis. - He is not on testosterone supplementation.  He does not take any diuretics. - No aquagenic pruritus, vasomotor symptoms, or B symptoms. - Patient has obstructive sleep apnea, but had had not been wearing his CPAP for 2 to 3 months prior to onset of his erythrocytosis.  Since that time, he has had new mask fitting and is using his CPAP nightly for the past 2 to 3 weeks. - Hematology workup (12/27/2022): Negative for mutations of JAK2, CALR, MPL, Exons 12-15 Normal LDH, normal erythropoietin 11.6 -Labs have been fairly stable over the last few draws.  -He received a one-time 250 mL phlebotomy for severe fatigue and headaches on 08/08/2023 with mild improvement of the symptoms. -Hemoglobin from 09/29/2023 is 15 (15.2) with an otherwise unremarkable differential. 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  Recommend improved compliance with CPAP. - No indication  for therapeutic phlebotomy unless severely symptomatic or HCT >54.0% - We will recheck labs and see patient for follow-up visit in 6 months.  If hemoglobin remains normal over the next 6 to 12 months, would consider  discharge to PCP. - Would consider bone marrow biopsy if unexplained elevations in hemoglobin or expansion to other cell lines  2.  GI bleeding/bloating and pain: -He is scheduled for an EGD and colonoscopy next week. -Had to positive fecal occult on 09/24/2023. -CT scan from 08/08/2023 showed hepatic steatosis and diverticular disease of the colon without acute wall thickening. -She is scheduled for an EGD/colonoscopy next week. -No additional GI bleeding since 09/24/2023 and his ED visit. -He will likely have to have hiatal hernia surgery at some point but this has not been scheduled yet.  Follow Up Instructions: Continue follow-up with gastroenterology given significant GI symptoms. Return to clinic in 6 months with labs a few days before and see provider.    I discussed the assessment and treatment plan with the patient. The patient was provided an opportunity to ask questions and all were answered. The patient agreed with the plan and demonstrated an understanding of the instructions.   The patient was advised to call back or seek an in-person evaluation if the symptoms worsen or if the condition fails to improve as anticipated.  I provided 20 minutes of non-face-to-face time during this encounter.   Mauro Kaufmann, NP

## 2023-10-04 ENCOUNTER — Encounter: Payer: Self-pay | Admitting: Hematology

## 2023-10-06 NOTE — Telephone Encounter (Signed)
Prior Berkley Harvey was approved on 11/20

## 2023-10-07 ENCOUNTER — Other Ambulatory Visit (HOSPITAL_COMMUNITY): Payer: Self-pay | Admitting: General Surgery

## 2023-10-07 DIAGNOSIS — K449 Diaphragmatic hernia without obstruction or gangrene: Secondary | ICD-10-CM

## 2023-10-08 ENCOUNTER — Encounter (HOSPITAL_COMMUNITY)
Admission: RE | Disposition: A | Discharge status: Home / Self Care | Payer: Self-pay | Source: Home / Self Care | Attending: Gastroenterology

## 2023-10-08 ENCOUNTER — Encounter (HOSPITAL_COMMUNITY): Payer: Self-pay | Admitting: Gastroenterology

## 2023-10-08 ENCOUNTER — Ambulatory Visit (HOSPITAL_COMMUNITY): Payer: 59 | Admitting: Anesthesiology

## 2023-10-08 ENCOUNTER — Other Ambulatory Visit: Payer: Self-pay

## 2023-10-08 ENCOUNTER — Ambulatory Visit (HOSPITAL_COMMUNITY)
Admission: RE | Admit: 2023-10-08 | Discharge: 2023-10-08 | Disposition: A | Discharge status: Home / Self Care | Payer: 59 | Attending: Gastroenterology | Admitting: Gastroenterology

## 2023-10-08 DIAGNOSIS — I129 Hypertensive chronic kidney disease with stage 1 through stage 4 chronic kidney disease, or unspecified chronic kidney disease: Secondary | ICD-10-CM | POA: Insufficient documentation

## 2023-10-08 DIAGNOSIS — R7303 Prediabetes: Secondary | ICD-10-CM

## 2023-10-08 DIAGNOSIS — F419 Anxiety disorder, unspecified: Secondary | ICD-10-CM | POA: Insufficient documentation

## 2023-10-08 DIAGNOSIS — G4733 Obstructive sleep apnea (adult) (pediatric): Secondary | ICD-10-CM | POA: Insufficient documentation

## 2023-10-08 DIAGNOSIS — K2270 Barrett's esophagus without dysplasia: Secondary | ICD-10-CM

## 2023-10-08 DIAGNOSIS — K449 Diaphragmatic hernia without obstruction or gangrene: Secondary | ICD-10-CM

## 2023-10-08 DIAGNOSIS — Z7984 Long term (current) use of oral hypoglycemic drugs: Secondary | ICD-10-CM | POA: Diagnosis not present

## 2023-10-08 DIAGNOSIS — E1142 Type 2 diabetes mellitus with diabetic polyneuropathy: Secondary | ICD-10-CM | POA: Diagnosis not present

## 2023-10-08 DIAGNOSIS — K921 Melena: Secondary | ICD-10-CM | POA: Diagnosis not present

## 2023-10-08 DIAGNOSIS — N189 Chronic kidney disease, unspecified: Secondary | ICD-10-CM | POA: Insufficient documentation

## 2023-10-08 DIAGNOSIS — G4730 Sleep apnea, unspecified: Secondary | ICD-10-CM | POA: Diagnosis not present

## 2023-10-08 DIAGNOSIS — G709 Myoneural disorder, unspecified: Secondary | ICD-10-CM | POA: Insufficient documentation

## 2023-10-08 DIAGNOSIS — K219 Gastro-esophageal reflux disease without esophagitis: Secondary | ICD-10-CM | POA: Diagnosis not present

## 2023-10-08 DIAGNOSIS — E1122 Type 2 diabetes mellitus with diabetic chronic kidney disease: Secondary | ICD-10-CM | POA: Diagnosis not present

## 2023-10-08 HISTORY — DX: Sleep apnea, unspecified: G47.30

## 2023-10-08 HISTORY — PX: GIVENS CAPSULE STUDY: SHX5432

## 2023-10-08 HISTORY — DX: Essential (primary) hypertension: I10

## 2023-10-08 HISTORY — PX: ESOPHAGOGASTRODUODENOSCOPY (EGD) WITH PROPOFOL: SHX5813

## 2023-10-08 HISTORY — DX: Type 2 diabetes mellitus without complications: E11.9

## 2023-10-08 LAB — GLUCOSE, CAPILLARY: Glucose-Capillary: 74 mg/dL (ref 70–99)

## 2023-10-08 SURGERY — ESOPHAGOGASTRODUODENOSCOPY (EGD) WITH PROPOFOL
Anesthesia: General

## 2023-10-08 MED ORDER — LIDOCAINE HCL (CARDIAC) PF 100 MG/5ML IV SOSY
PREFILLED_SYRINGE | INTRAVENOUS | Status: DC | PRN
  Administered 2023-10-08: 100 mg via INTRAVENOUS

## 2023-10-08 MED ORDER — LACTATED RINGERS IV SOLN: INTRAVENOUS | Status: DC | PRN

## 2023-10-08 MED ORDER — PROPOFOL 10 MG/ML IV BOLUS
INTRAVENOUS | Status: DC | PRN
  Administered 2023-10-08: 100 mg via INTRAVENOUS

## 2023-10-08 MED ORDER — PROPOFOL 500 MG/50ML IV EMUL
INTRAVENOUS | Status: AC
  Filled 2023-10-08: qty 50

## 2023-10-08 MED ORDER — PROPOFOL 500 MG/50ML IV EMUL
INTRAVENOUS | Status: DC | PRN
  Administered 2023-10-08: 150 ug/kg/min via INTRAVENOUS

## 2023-10-08 MED ORDER — LACTATED RINGERS IV SOLN: INTRAVENOUS | Status: DC

## 2023-10-08 NOTE — Discharge Instructions (Signed)
You are being discharged to home.  Resume your previous diet.  Follow up results of capsule endoscopy.

## 2023-10-08 NOTE — H&P (Signed)
Elijah Shaffer is an 42 y.o. male.   Chief Complaint: Melena HPI: Elijah Shaffer is a 42 y.o. 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  Patient reports having intermittent episodes of melena and feeling lightheaded.  Has presented intermittent bloating and abdominal discomfort.  Not taking any NSAIDs or high-dose aspirin.  Most recent hemoglobin was 15.0 on 09/29/2023.  Past Medical History:  Diagnosis Date   Acute renal injury (HCC) 06/28/2017   Anxiety    Aspiration pneumonia of right upper lobe due to gastric secretions (HCC)    Diabetes mellitus without complication (HCC)    Diverticulitis    GERD (gastroesophageal reflux disease) 01/08/2018   Hypertension    Prediabetes    Seasonal allergies    Sleep apnea    Syncope 06/26/2021    Past Surgical History:  Procedure Laterality Date   BIOPSY  03/18/2021   Procedure: BIOPSY;  Surgeon: Dolores Frame, MD;  [facility]: [facility];  Service: Gastroenterology;;  esophageal at Z-line   BIOPSY  05/23/2021   Procedure: BIOPSY;  Surgeon: Dolores Frame, MD;  [facility]: [facility];  Service: Gastroenterology;;   BIOPSY  08/19/2023   Procedure: BIOPSY;  Surgeon: Dolores Frame, MD;  [facility]: [facility];  Service: Gastroenterology;;   CARDIOPULMONARY EXERCISE TEST (CPX)  01/09/2022   Interpretation limited by submaximal effort.  Severe functional impairment when compared to max sedentary norms.  No cardiopulmonary limitations.  Noted tremor and generalized weakness that lead to premature exercise termination-consider neuromuscular evaluation.   COLONOSCOPY N/A 09/22/2017   Procedure: COLONOSCOPY;  Surgeon: Malissa Hippo, MD;  Location: [facility];  Service: Endoscopy;  Laterality: N/A;  9:55   COLONOSCOPY WITH  PROPOFOL N/A 05/23/2021   Procedure: COLONOSCOPY WITH PROPOFOL;  Surgeon: Dolores Frame, MD;  [facility]: [facility];  Service: Gastroenterology;  Laterality: N/A;  7:30   ESOPHAGOGASTRODUODENOSCOPY (EGD) WITH PROPOFOL N/A 03/18/2021   Procedure: ESOPHAGOGASTRODUODENOSCOPY (EGD) WITH PROPOFOL;  Surgeon: Dolores Frame, MD;  [facility]: [facility];  Service: Gastroenterology;  Laterality: N/A;   ESOPHAGOGASTRODUODENOSCOPY (EGD) WITH PROPOFOL N/A 05/23/2021   Procedure: ESOPHAGOGASTRODUODENOSCOPY (EGD) WITH PROPOFOL;  Surgeon: Dolores Frame, MD;  [facility]: [facility];  Service: Gastroenterology;  Laterality: N/A;   ESOPHAGOGASTRODUODENOSCOPY (EGD) WITH PROPOFOL N/A 08/19/2023   Procedure: ESOPHAGOGASTRODUODENOSCOPY (EGD) WITH PROPOFOL;  Surgeon: Dolores Frame, MD;  [facility]: [facility];  Service: Gastroenterology;  Laterality: N/A;  11:45AM;ASA 3   POLYPECTOMY  09/22/2017   Procedure: POLYPECTOMY;  Surgeon: Malissa Hippo, MD;  [facility]: [facility];  Service: Endoscopy;;   RIGHT/LEFT HEART CATH AND CORONARY ANGIOGRAPHY N/A 12/12/2021   Procedure: RIGHT/LEFT HEART CATH AND CORONARY ANGIOGRAPHY;  Surgeon: Dolores Patty, MD;  Location: [facility];  Service: Cardiovascular:: Normal Coronaries & Hemodynamics.  EF 50-55%. RA = 4 mmHg, RV = 23/4; PA = 22/6 (14) & PCW = 7; Ao sat = 95%; PA sat = 76%, 76%& SVC sat = 74%Fick cardiac output/index = 7.1/3.0; PVR = 1.0 WU   TRANSTHORACIC ECHOCARDIOGRAM  02/24/2021   EF 60 to 65%.  Normal LV function.  Normal valves.  Mild RV dilation with normal function.   ZIO PATCH EVENT MONITOR  01/2022   Sinus rhythm with heart rate range 24 to 159 bpm, 1  AVB and Wenckebach block noted  along with rare PACs and PVCs.  No arrhythmias.    Family History  Problem Relation Age of Onset   Healthy Mother    Cervical cancer Mother    Cancer Father    Colon cancer Maternal Grandmother    Colon cancer  Maternal Grandfather    Social History:  reports that he has never smoked. He has been exposed to tobacco smoke. He has never used smokeless tobacco. He reports that he does not currently use alcohol. He reports that he does not use drugs.  Allergies:  Allergies  Allergen Reactions   Gramineae Pollens Itching   Claritin [Loratadine] Other (See Comments)    Heart race   Loratadine-Pseudoephedrine Er Palpitations   Pollen Extract Other (See Comments)    Seasonal allergies    Medications Prior to Admission  Medication Sig Dispense Refill   amLODipine (NORVASC) 5 MG tablet Take 1 tablet by mouth once daily 90 tablet 0   aspirin EC 81 MG tablet Take 81 mg by mouth daily. Swallow whole.     betamethasone dipropionate 0.05 % lotion Apply 1-2 times daily to affected itchy areas at scalp. Avoid applying to face, groin, and axilla. Use as directed. Long-term use can cause thinning of the skin. 60 mL 2   cetirizine (ZYRTEC) 10 MG tablet Take 10 mg by mouth daily.     Cholecalciferol (VITAMIN D-3 PO) Take by mouth daily at 6 (six) AM.     cyclobenzaprine (FLEXERIL) 5 MG tablet Take 1 tablet (5 mg total) by mouth at bedtime as needed for muscle spasms. 30 tablet 1   dicyclomine (BENTYL) 20 MG tablet Take 1 tablet (20 mg total) by mouth 2 (two) times daily as needed for spasms. 20 tablet 0   docusate sodium (COLACE) 100 MG capsule Take 100 mg by mouth 2 (two) times daily.     DULoxetine (CYMBALTA) 30 MG capsule Take 60 mg by mouth daily.     empagliflozin (JARDIANCE) 10 MG TABS tablet Take 1 tablet (10 mg total) by mouth daily before breakfast. 60 tablet 1   fenofibrate (TRICOR) 145 MG tablet Take 1 tablet (145 mg total) by mouth daily. 90 tablet 3   gabapentin (NEURONTIN) 300 MG capsule Take 300 mg by mouth 3 (three) times daily.     hydrOXYzine (VISTARIL) 25 MG capsule TAKE 1 or 2 CAPSULES BY MOUTH BEFORE BED AS NEEDED FOR ITCHING. CAN CAUSE DROWSINESS. 60 capsule 2   lubiprostone (AMITIZA) 8 MCG  capsule Take 1 capsule (8 mcg total) by mouth 2 (two) times daily with a meal. 60 capsule 3   omega-3 acid ethyl esters (LOVAZA) 1 g capsule Take 2 capsules (2 g total) by mouth 2 (two) times daily. 120 capsule 3   omeprazole (PRILOSEC) 40 MG capsule Take 1 capsule by mouth twice daily 180 capsule 0   rosuvastatin (CRESTOR) 40 MG tablet Take 1 tablet (40 mg total) by mouth daily. 90 tablet 3   senna (SENOKOT) 8.6 MG tablet Take 1 tablet by mouth daily. prn     Testosterone 25 MG/2.5GM (1%) GEL Place 2.5 g onto the skin in the morning. 75 g 5   Vitamin D, Ergocalciferol, (DRISDOL) 1.25 MG (50000 UNIT) CAPS capsule Take 1 capsule (50,000 Units total) by mouth every 7 (seven) days. 20 capsule 1   albuterol (VENTOLIN HFA) 108 (90 Base) MCG/ACT inhaler INHALE 2 PUFFS BY MOUTH EVERY 6 HOURS AS NEEDED FOR SHORTNESS OF BREATH 9 g 0   b complex vitamins capsule  Take 1 capsule by mouth daily. With D3 gummie     clindamycin (CLEOCIN T) 1 % external solution Apply topically to acne bumps at aa's qd 30 mL 3   clobetasol (TEMOVATE) 0.05 % external solution Apply 1 application. topically 2 (two) times daily. Use as directed.  Mix with cerave cream. Use for up to 4 weeks. Avoid applying to face, groin, and axilla. Use as directed. 50 mL 1   clobetasol cream (TEMOVATE) 0.05 % Apply small amount daily before bed to right ankle or other areas of body as needed for itchy rash Avoid applying to face, groin, and axilla Use as directed. 30 g 0   diphenhydrAMINE-zinc acetate (BENADRYL EXTRA STRENGTH) cream Apply 1 application topically 3 (three) times daily as needed for itching. 28.4 g 0   dupilumab (DUPIXENT) 300 MG/2ML prefilled syringe Inject 300 mg into the skin every 14 (fourteen) days. Starting at day 15 for maintenance. 4 mL 5   Fluocinolone Acetonide 0.01 % OIL Use 1 - 2 drop qd/bid prn for scale at ears 20 mL 2   fluocinonide (LIDEX) 0.05 % external solution Apply once or twice daily to affected areas on scalp up  to 2 weeks as needed for itching. Avoid applying to face, groin, and axilla. 60 mL 2   fluticasone (FLONASE ALLERGY RELIEF) 50 MCG/ACT nasal spray Place 2 sprays into both nostrils daily as needed for allergies or rhinitis. (Patient taking differently: Place 2 sprays into both nostrils daily.) 9.9 mL 2   guaiFENesin (MUCINEX) 600 MG 12 hr tablet Take by mouth.     ketoconazole (NIZORAL) 2 % shampoo Apply 1 Application topically daily as needed for irritation. 120 mL 11   montelukast (SINGULAIR) 10 MG tablet TAKE 1 TABLET BY MOUTH AT BEDTIME 90 tablet 3   naproxen (NAPROSYN) 500 MG tablet Take 500 mg by mouth 2 (two) times daily.     NEEDLE, DISP, 18 G 18G X 1-1/2" MISC Use to draw up testosterone for injection 2 each 12   ondansetron (ZOFRAN) 4 MG tablet Take 1 tablet (4 mg total) by mouth every 6 (six) hours as needed for nausea. 20 tablet 0   potassium gluconate (RA POTASSIUM GLUCONATE) 595 (99 K) MG TABS tablet Take 1 tablet by mouth daily.     propranolol (INDERAL) 20 MG tablet Take one tablet 20 mg  twice daily  may take an  additional dose of 20 mg  as needed for palpitations 190 tablet 3   SYRINGE-NEEDLE, DISP, 3 ML 22G X 1-1/2" 3 ML MISC Use to inject testosterone 2 each 12   tacrolimus (PROTOPIC) 0.1 % ointment APPLY TOPICALLY TO AFFECTED AREAS OF BODY FOR RASH DAILY OR TWICE DAILY AS NEEDED 30 g 0   triamcinolone cream (KENALOG) 0.1 %      Upadacitinib ER (RINVOQ) 15 MG TB24 Take 1 tablet (15 mg total) by mouth daily. 30 tablet 5    Results for orders placed or performed during the hospital encounter of 10/08/23 (from the past 48 hour(s))  Glucose, capillary     Status: None   Collection Time: 10/08/23 12:42 PM  Result Value Ref Range   Glucose-Capillary 74 70 - 99 mg/dL    Comment: Glucose reference range applies only to samples taken after fasting for at least 8 hours.   No results found.  Review of Systems  Gastrointestinal:  Positive for blood in stool.  All other systems  reviewed and are negative.   Blood pressure (!) 146/90, pulse  75, temperature 98 F (36.7 C), temperature source Oral, resp. rate 13, height 6\' 3"  (1.905 m), weight 117.9 kg, SpO2 99%. Physical Exam  GENERAL: The patient is AO x3, in no acute distress. HEENT: Head is normocephalic and atraumatic. EOMI are intact. Mouth is well hydrated and without lesions. NECK: Supple. No masses LUNGS: Clear to auscultation. No presence of rhonchi/wheezing/rales. Adequate chest expansion HEART: RRR, normal s1 and s2. ABDOMEN: Soft, nontender, no guarding, no peritoneal signs, and nondistended. BS +. No masses. EXTREMITIES: Without any cyanosis, clubbing, rash, lesions or edema. NEUROLOGIC: AOx3, no focal motor deficit. SKIN: no jaundice, no rashes  Assessment/Plan Elijah Shaffer is a 42 y.o. 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  Will proceed with EGD with capsule endoscopy deployment.  Dolores Frame, MD 10/08/2023, 12:57 PM

## 2023-10-08 NOTE — Anesthesia Preprocedure Evaluation (Addendum)
Anesthesia Evaluation  Patient identified by MRN, date of birth, ID band Patient awake    Reviewed: Allergy & Precautions, H&P , NPO status , Patient's Chart, lab work & pertinent test results, reviewed documented beta blocker date and time   Airway Mallampati: II  TM Distance: >3 FB Neck ROM: full    Dental  (+) Dental Advisory Given, Missing,    Pulmonary sleep apnea , pneumonia, resolved History aspiration pneumonia  due to reflux   Pulmonary exam normal breath sounds clear to auscultation       Cardiovascular Exercise Tolerance: Good hypertension, + DOE  Normal cardiovascular exam Rhythm:regular Rate:Normal  Syncope. Normal cardiac cath in 2023 with OK EF   Neuro/Psych  PSYCHIATRIC DISORDERS Anxiety Depression     Neuromuscular disease negative neurological ROS  negative psych ROS   GI/Hepatic negative GI ROS, Neg liver ROS, hiatal hernia,GERD  ,,(+) Hepatitis -  Endo/Other  diabetes  Pre diabetes  Renal/GU Renal InsufficiencyRenal diseaseHistory of acute and chronic kidney disease  negative genitourinary   Musculoskeletal   Abdominal Normal abdominal exam  (+)   Peds  Hematology negative hematology ROS (+)   Anesthesia Other Findings   Reproductive/Obstetrics negative OB ROS                             Anesthesia Physical Anesthesia Plan  ASA: 3  Anesthesia Plan: General   Post-op Pain Management: Minimal or no pain anticipated   Induction: Intravenous  PONV Risk Score and Plan: Propofol infusion  Airway Management Planned: Nasal Cannula and Natural Airway  Additional Equipment: None  Intra-op Plan:   Post-operative Plan:   Informed Consent: I have reviewed the patients History and Physical, chart, labs and discussed the procedure including the risks, benefits and alternatives for the proposed anesthesia with the patient or authorized representative who has indicated  his/her understanding and acceptance.     Dental Advisory Given  Plan Discussed with: CRNA  Anesthesia Plan Comments:        Anesthesia Quick Evaluation

## 2023-10-08 NOTE — Anesthesia Postprocedure Evaluation (Signed)
Anesthesia Post Note  Patient: Elijah Shaffer  Procedure(s) Performed: ESOPHAGOGASTRODUODENOSCOPY (EGD) WITH PROPOFOL GIVENS CAPSULE STUDY  Patient location during evaluation: PACU Anesthesia Type: General Level of consciousness: awake and alert Pain management: pain level controlled Vital Signs Assessment: post-procedure vital signs reviewed and stable Respiratory status: spontaneous breathing, nonlabored ventilation, respiratory function stable and patient connected to nasal cannula oxygen Cardiovascular status: blood pressure returned to baseline and stable Postop Assessment: no apparent nausea or vomiting Anesthetic complications: no   No notable events documented.   Last Vitals:  Vitals:   10/08/23 1216 10/08/23 1418  BP: (!) 146/90 129/68  Pulse: 75 97  Resp: 13 20  Temp: 36.7 C 36.6 C  SpO2: 99% 93%    Last Pain:  Vitals:   10/08/23 1418  TempSrc: Oral  PainSc: 0-No pain                 Aarna Mihalko L Parley Pidcock

## 2023-10-08 NOTE — Op Note (Addendum)
[facility] Patient Name: Elijah Shaffer Procedure Date: 10/08/2023 1:48 PM MRN: [id number] Date of Birth: October 19, 1981 Attending MD: Katrinka Blazing , , [id number] CSN: [id number] Age: 42 Admit Type: Outpatient Procedure:                Upper GI endoscopy Indications:              Melena Providers:                Katrinka Blazing, Crystal Page, Kristine L. Jessee Avers, Technician Referring MD:              Medicines:                Monitored Anesthesia Care Complications:            No immediate complications. Estimated Blood Loss:     Estimated blood loss: none. Procedure:                Pre-Anesthesia Assessment:                           - Prior to the procedure, a History and Physical                            was performed, and patient medications, allergies                            and sensitivities were reviewed. The patient's                            tolerance of previous anesthesia was reviewed.                           - The risks and benefits of the procedure and the                            sedation options and risks were discussed with the                            patient. All questions were answered and informed                            consent was obtained.                           - ASA Grade Assessment: II - A patient with mild                            systemic disease.                           After obtaining informed consent, the endoscope was                            passed under direct vision. Throughout the  procedure, the patient's blood pressure, pulse, and                            oxygen saturations were monitored continuously. The                            [id number]-H190 ([id number]) scope was introduced through the                            mouth, and advanced to the second part of duodenum.                            The upper GI endoscopy was accomplished without                             difficulty. The patient tolerated the procedure                            well. Scope In: 2:03:37 PM Scope Out: 2:12:47 PM Total Procedure Duration: 0 hours 9 minutes 10 seconds  Findings:      The esophagus and gastroesophageal junction were examined with white       light and narrow band imaging (NBI). There were esophageal mucosal       changes secondary to established long-segment Barrett's disease. These       changes involved the mucosa at the upper extent of the gastric folds (37       cm from the incisors) extending to the Z-line (31 cm from the incisors).       Salmon-colored mucosa was present. The maximum longitudinal extent of       these esophageal mucosal changes was 6 cm in length.      An 8 cm hiatal hernia was found.      The exam of the stomach was otherwise normal.      The examined duodenum was normal. Endoscopic capsule was successfully       deployed in the duodenal bulb. Impression:               - Esophageal mucosal changes secondary to                            established long-segment Barrett's disease.                           - 8 cm hiatal hernia.                           - Normal examined duodenum. Capsule endoscopy                            deployed in duodenum.                           - No specimens collected. Moderate Sedation:      Per Anesthesia Care Recommendation:           - Discharge patient to home (ambulatory).                           -  Resume previous diet.                           - Follow up results of capsule endoscopy. Procedure Code(s):        --- Professional ---                           [phone], Esophagogastroduodenoscopy, flexible,                            transoral; diagnostic, including collection of                            specimen(s) by brushing or washing, when performed                            (separate procedure) Diagnosis Code(s):        --- Professional ---                           K22.70, Barrett's  esophagus without dysplasia                           K44.9, Diaphragmatic hernia without obstruction or                            gangrene                           K92.1, Melena (includes Hematochezia) CPT copyright 2022 [employer]. All rights reserved. The codes documented in this report are preliminary and upon coder review may  be revised to meet current compliance requirements. Katrinka Blazing, MD Katrinka Blazing,  10/08/2023 2:25:19 PM This report has been signed electronically. Number of Addenda: 0

## 2023-10-08 NOTE — Transfer of Care (Signed)
Immediate Anesthesia Transfer of Care Note  Patient: Elijah Shaffer  Procedure(s) Performed: ESOPHAGOGASTRODUODENOSCOPY (EGD) WITH PROPOFOL GIVENS CAPSULE STUDY  Patient Location: Short Stay  Anesthesia Type:General  Level of Consciousness: awake, alert , oriented, and patient cooperative  Airway & Oxygen Therapy: Patient Spontanous Breathing  Post-op Assessment: Report given to RN, Post -op Vital signs reviewed and stable, and Patient moving all extremities X 4  Post vital signs: Reviewed and stable  Last Vitals:  Vitals Value Taken Time  BP 129/68 10/08/23 1418  Temp 36.6 C 10/08/23 1418  Pulse 97 10/08/23 1418  Resp 20 10/08/23 1418  SpO2 93 % 10/08/23 1418    Last Pain:  Vitals:   10/08/23 1418  TempSrc: Oral  PainSc: 0-No pain      Patients Stated Pain Goal: 5 (10/08/23 1216)  Complications: No notable events documented.

## 2023-10-10 ENCOUNTER — Other Ambulatory Visit: Payer: Self-pay | Admitting: Internal Medicine

## 2023-10-10 DIAGNOSIS — R0609 Other forms of dyspnea: Secondary | ICD-10-CM

## 2023-10-12 ENCOUNTER — Encounter: Payer: Self-pay | Admitting: Internal Medicine

## 2023-10-13 ENCOUNTER — Ambulatory Visit (INDEPENDENT_AMBULATORY_CARE_PROVIDER_SITE_OTHER): Payer: 59 | Admitting: Internal Medicine

## 2023-10-13 ENCOUNTER — Encounter: Payer: Self-pay | Admitting: Internal Medicine

## 2023-10-13 VITALS — BP 126/84 | HR 65 | Ht 75.0 in | Wt 272.6 lb

## 2023-10-13 DIAGNOSIS — R5382 Chronic fatigue, unspecified: Secondary | ICD-10-CM

## 2023-10-13 DIAGNOSIS — G4733 Obstructive sleep apnea (adult) (pediatric): Secondary | ICD-10-CM | POA: Diagnosis not present

## 2023-10-13 DIAGNOSIS — N1831 Chronic kidney disease, stage 3a: Secondary | ICD-10-CM

## 2023-10-13 DIAGNOSIS — I10 Essential (primary) hypertension: Secondary | ICD-10-CM | POA: Diagnosis not present

## 2023-10-13 DIAGNOSIS — Z7984 Long term (current) use of oral hypoglycemic drugs: Secondary | ICD-10-CM | POA: Diagnosis not present

## 2023-10-13 DIAGNOSIS — E1122 Type 2 diabetes mellitus with diabetic chronic kidney disease: Secondary | ICD-10-CM

## 2023-10-13 DIAGNOSIS — E782 Mixed hyperlipidemia: Secondary | ICD-10-CM | POA: Diagnosis not present

## 2023-10-13 MED ORDER — BLOOD GLUCOSE MONITORING SUPPL DEVI: 1.0000 | Freq: Three times a day (TID) | 0 refills | Status: AC

## 2023-10-13 MED ORDER — LANCET DEVICE MISC: 1.0000 | Freq: Three times a day (TID) | 1 refills | Status: AC

## 2023-10-13 MED ORDER — BLOOD GLUCOSE TEST VI STRP: 1.0000 | ORAL_STRIP | Freq: Three times a day (TID) | 1 refills | Status: AC

## 2023-10-13 NOTE — Assessment & Plan Note (Signed)
BP Readings from Last 1 Encounters:  10/13/23 126/84   Well-controlled with Amlodipine 5 mg QD On propranolol for PSVT and tremors Counseled for compliance with the medications Advised DASH diet and moderate exercise/walking, at least 150 mins/week

## 2023-10-13 NOTE — Assessment & Plan Note (Signed)
On Crestor and Fenofibrate Check lipid profile

## 2023-10-13 NOTE — Patient Instructions (Addendum)
Please continue to take medications as prescribed.  Please continue to follow low carb diet and perform moderate exercise/walking at least 150 mins/week.  Please get fasting blood tests done before the next visit. 

## 2023-10-13 NOTE — Assessment & Plan Note (Addendum)
Uses CPAP regularly Has anxiety while using it, but is overall manageable Followed by pulmonology

## 2023-10-13 NOTE — Progress Notes (Signed)
Established Patient Office Visit  Subjective:  Patient ID: Elijah Shaffer, male    DOB: 04-15-1981  Age: 42 y.o. MRN: [id number]  CC:  Chief Complaint  Patient presents with   Fatigue    Tired and fatigued all the time   Diabetes    Recently diagnosed    HPI Elijah Shaffer is a 42 y.o. male with past medical history of HTN, type 2 DM, GERD, eczema, anxiety and chronic dyspnea who presents for f/u of his chronic medical conditions.  His blood pressure is wnl today.  He takes amlodipine 5 mg QD regularly.  He has chronic palpitations and dyspnea on exertion.  Denies any chest pain currently.  He takes propranolol for palpitations.  Has had cardiac and pulmonology workup, which have been overall benign except obesity.  Type II DM: His HbA1c was 6.7 in 10/24.  He was recently started on Jardiance 10 mg QD.  Denies any polyuria or polydipsia currently.  He reports that he likes doughnuts and needs to cut down soft drinks.  IBS: Denies any abdominal pain currently.  He usually has chronic constipation and takes Amitiza for it.  Denies any melena or hematochezia. He had EGD in the last week for concern for melena, but showed no signs of active bleeding.  Anxiety: He takes Cymbalta currently.  His Prozac was discontinued in the last visit due to excessive sweating concern, his sweating has improved now.  He still has episodes of anxiety and tremors.  He was placed on clonazepam by Dr. Gerilyn Pilgrim, but was discontinued by [facility].  He has multiple physical complaints such as constant itching, dyspnea, tremors, etc., likely somewhat attributed to anxiety.  He had urology evaluation for low testosterone. He is on testosterone supplement through gel, followed by Urology.  Denies any dysuria, hematuria or urethral discharge currently.  He has chronic fatigue, but denies any fever, chills, hemoptysis, recent weight loss or LAD.    Past Medical History:  Diagnosis Date   Acute renal  injury (HCC) 06/28/2017   Anxiety    Aspiration pneumonia of right upper lobe due to gastric secretions (HCC)    Diabetes mellitus without complication (HCC)    Diverticulitis    GERD (gastroesophageal reflux disease) 01/08/2018   Hypertension    Prediabetes    Seasonal allergies    Sleep apnea    Syncope 06/26/2021    Past Surgical History:  Procedure Laterality Date   BIOPSY  03/18/2021   Procedure: BIOPSY;  Surgeon: Dolores Frame, MD;  [facility];  Service: Gastroenterology;;  esophageal at Z-line   BIOPSY  05/23/2021   Procedure: BIOPSY;  Surgeon: Dolores Frame, MD;  [facility];  Service: Gastroenterology;;   BIOPSY  08/19/2023   Procedure: BIOPSY;  Surgeon: Dolores Frame, MD;  [facility];  Service: Gastroenterology;;   CARDIOPULMONARY EXERCISE TEST (CPX)  01/09/2022   Interpretation limited by submaximal effort.  Severe functional impairment when compared to max sedentary norms.  No cardiopulmonary limitations.  Noted tremor and generalized weakness that lead to premature exercise termination-consider neuromuscular evaluation.   COLONOSCOPY N/A 09/22/2017   Procedure: COLONOSCOPY;  Surgeon: Malissa Hippo, MD;  [facility];  Service: Endoscopy;  Laterality: N/A;  9:55   COLONOSCOPY WITH PROPOFOL N/A 05/23/2021   Procedure: COLONOSCOPY WITH PROPOFOL;  Surgeon: Dolores Frame, MD;  [facility];  Service: Gastroenterology;  Laterality: N/A;  7:30   ESOPHAGOGASTRODUODENOSCOPY (EGD)  WITH PROPOFOL N/A 03/18/2021   Procedure: ESOPHAGOGASTRODUODENOSCOPY (EGD) WITH PROPOFOL;  Surgeon: Dolores Frame, MD;  [facility];  Service: Gastroenterology;  Laterality: N/A;   ESOPHAGOGASTRODUODENOSCOPY (EGD) WITH PROPOFOL N/A 05/23/2021   Procedure: ESOPHAGOGASTRODUODENOSCOPY (EGD) WITH PROPOFOL;  Surgeon: Dolores Frame, MD;  [facility];   Service: Gastroenterology;  Laterality: N/A;   ESOPHAGOGASTRODUODENOSCOPY (EGD) WITH PROPOFOL N/A 08/19/2023   Procedure: ESOPHAGOGASTRODUODENOSCOPY (EGD) WITH PROPOFOL;  Surgeon: Dolores Frame, MD;  [facility];  Service: Gastroenterology;  Laterality: N/A;  11:45AM;ASA 3   POLYPECTOMY  09/22/2017   Procedure: POLYPECTOMY;  Surgeon: Malissa Hippo, MD;  [facility];  Service: Endoscopy;;   RIGHT/LEFT HEART CATH AND CORONARY ANGIOGRAPHY N/A 12/12/2021   Procedure: RIGHT/LEFT HEART CATH AND CORONARY ANGIOGRAPHY;  Surgeon: Dolores Patty, MD;  [facility];  Service: Cardiovascular:: Normal Coronaries & Hemodynamics.  EF 50-55%. RA = 4 mmHg, RV = 23/4; PA = 22/6 (14) & PCW = 7; Ao sat = 95%; PA sat = 76%, 76%& SVC sat = 74%Fick cardiac output/index = 7.1/3.0; PVR = 1.0 WU   TRANSTHORACIC ECHOCARDIOGRAM  02/24/2021   EF 60 to 65%.  Normal LV function.  Normal valves.  Mild RV dilation with normal function.   ZIO PATCH EVENT MONITOR  01/2022   Sinus rhythm with heart rate range 24 to 159 bpm, 1  AVB and Wenckebach block noted along with rare PACs and PVCs.  No arrhythmias.    Family History  Problem Relation Age of Onset   Healthy Mother    Cervical cancer Mother    Cancer Father    Colon cancer Maternal Grandmother    Colon cancer Maternal Grandfather     Social History   Socioeconomic History   Marital status: Single    Spouse name: Not on file   Number of children: Not on file   Years of education: Not on file   Highest education level: Not on file  Occupational History   Not on file  Tobacco Use   Smoking status: Never    Passive exposure: Past   Smokeless tobacco: Never  Vaping Use   Vaping status: Never Used  Substance and Sexual Activity   Alcohol use: Not Currently    Comment: occasionally   Drug use: No   Sexual activity: Not on file  Other Topics Concern   Not on file  Social History Narrative   He  currently has his own lawn care landscaping service. He previous had 80 yards before he got sick and since decreased. He works around a Interior and spatial designer. One of the chemical names is roundup.    Social Determinants of Health   Financial Resource Strain: Not on file  Food Insecurity: No Food Insecurity (06/20/2023)   Hunger Vital Sign    Worried About Running Out of Food in the Last Year: Never true    Ran Out of Food in the Last Year: Never true  Transportation Needs: No Transportation Needs (12/27/2022)   PRAPARE - Administrator, Civil Service (Medical): No    Lack of Transportation (Non-Medical): No  Physical Activity: Insufficiently Active (03/29/2022)   Exercise Vital Sign    Days of Exercise per Week: 2 days    Minutes of Exercise per Session: 10 min  Stress: Stress Concern Present (05/07/2022)   [employer] of Occupational Health - Occupational Stress Questionnaire    Feeling of Stress : To some  extent  Social Connections: Not on file  Intimate Partner Violence: Not At Risk (06/20/2023)   Humiliation, Afraid, Rape, and Kick questionnaire    Fear of Current or Ex-Partner: No    Emotionally Abused: No    Physically Abused: No    Sexually Abused: No    Outpatient Medications Prior to Visit  Medication Sig Dispense Refill   amLODipine (NORVASC) 5 MG tablet Take 1 tablet by mouth once daily 90 tablet 0   aspirin EC 81 MG tablet Take 81 mg by mouth daily. Swallow whole.     b complex vitamins capsule Take 1 capsule by mouth daily. With D3 gummie     betamethasone dipropionate 0.05 % lotion Apply 1-2 times daily to affected itchy areas at scalp. Avoid applying to face, groin, and axilla. Use as directed. Long-term use can cause thinning of the skin. 60 mL 2   cetirizine (ZYRTEC) 10 MG tablet Take 10 mg by mouth daily.     Cholecalciferol (VITAMIN D-3 PO) Take by mouth daily at 6 (six) AM.     clindamycin (CLEOCIN T) 1 % external solution Apply topically to acne bumps  at aa's qd 30 mL 3   clobetasol (TEMOVATE) 0.05 % external solution Apply 1 application. topically 2 (two) times daily. Use as directed.  Mix with cerave cream. Use for up to 4 weeks. Avoid applying to face, groin, and axilla. Use as directed. 50 mL 1   clobetasol cream (TEMOVATE) 0.05 % Apply small amount daily before bed to right ankle or other areas of body as needed for itchy rash Avoid applying to face, groin, and axilla Use as directed. 30 g 0   cyclobenzaprine (FLEXERIL) 5 MG tablet Take 1 tablet (5 mg total) by mouth at bedtime as needed for muscle spasms. 30 tablet 1   dicyclomine (BENTYL) 20 MG tablet Take 1 tablet (20 mg total) by mouth 2 (two) times daily as needed for spasms. 20 tablet 0   diphenhydrAMINE-zinc acetate (BENADRYL EXTRA STRENGTH) cream Apply 1 application topically 3 (three) times daily as needed for itching. 28.4 g 0   docusate sodium (COLACE) 100 MG capsule Take 100 mg by mouth 2 (two) times daily.     DULoxetine (CYMBALTA) 30 MG capsule Take 60 mg by mouth daily.     dupilumab (DUPIXENT) 300 MG/2ML prefilled syringe Inject 300 mg into the skin every 14 (fourteen) days. Starting at day 15 for maintenance. 4 mL 5   empagliflozin (JARDIANCE) 10 MG TABS tablet Take 1 tablet (10 mg total) by mouth daily before breakfast. 60 tablet 1   fenofibrate (TRICOR) 145 MG tablet Take 1 tablet (145 mg total) by mouth daily. 90 tablet 3   Fluocinolone Acetonide 0.01 % OIL Use 1 - 2 drop qd/bid prn for scale at ears 20 mL 2   fluocinonide (LIDEX) 0.05 % external solution Apply once or twice daily to affected areas on scalp up to 2 weeks as needed for itching. Avoid applying to face, groin, and axilla. 60 mL 2   fluticasone (FLONASE ALLERGY RELIEF) 50 MCG/ACT nasal spray Place 2 sprays into both nostrils daily as needed for allergies or rhinitis. (Patient taking differently: Place 2 sprays into both nostrils daily.) 9.9 mL 2   gabapentin (NEURONTIN) 300 MG capsule Take 300 mg by mouth 3  (three) times daily.     guaiFENesin (MUCINEX) 600 MG 12 hr tablet Take by mouth.     hydrOXYzine (VISTARIL) 25 MG capsule TAKE 1 or 2 CAPSULES  BY MOUTH BEFORE BED AS NEEDED FOR ITCHING. CAN CAUSE DROWSINESS. 60 capsule 2   ketoconazole (NIZORAL) 2 % shampoo Apply 1 Application topically daily as needed for irritation. 120 mL 11   lubiprostone (AMITIZA) 8 MCG capsule Take 1 capsule (8 mcg total) by mouth 2 (two) times daily with a meal. 60 capsule 3   montelukast (SINGULAIR) 10 MG tablet TAKE 1 TABLET BY MOUTH AT BEDTIME 90 tablet 3   naproxen (NAPROSYN) 500 MG tablet Take 500 mg by mouth 2 (two) times daily.     NEEDLE, DISP, 18 G 18G X 1-1/2" MISC Use to draw up testosterone for injection 2 each 12   omega-3 acid ethyl esters (LOVAZA) 1 g capsule Take 2 capsules (2 g total) by mouth 2 (two) times daily. 120 capsule 3   omeprazole (PRILOSEC) 40 MG capsule Take 1 capsule by mouth twice daily 180 capsule 0   ondansetron (ZOFRAN) 4 MG tablet Take 1 tablet (4 mg total) by mouth every 6 (six) hours as needed for nausea. 20 tablet 0   potassium gluconate (RA POTASSIUM GLUCONATE) 595 (99 K) MG TABS tablet Take 1 tablet by mouth daily.     propranolol (INDERAL) 20 MG tablet Take one tablet 20 mg  twice daily  may take an  additional dose of 20 mg  as needed for palpitations 190 tablet 3   rosuvastatin (CRESTOR) 40 MG tablet Take 1 tablet (40 mg total) by mouth daily. 90 tablet 3   senna (SENOKOT) 8.6 MG tablet Take 1 tablet by mouth daily. prn     SYRINGE-NEEDLE, DISP, 3 ML 22G X 1-1/2" 3 ML MISC Use to inject testosterone 2 each 12   tacrolimus (PROTOPIC) 0.1 % ointment APPLY TOPICALLY TO AFFECTED AREAS OF BODY FOR RASH DAILY OR TWICE DAILY AS NEEDED 30 g 0   Testosterone 25 MG/2.5GM (1%) GEL Place 2.5 g onto the skin in the morning. 75 g 5   triamcinolone cream (KENALOG) 0.1 %      Upadacitinib ER (RINVOQ) 15 MG TB24 Take 1 tablet (15 mg total) by mouth daily. 30 tablet 5   Vitamin D, Ergocalciferol,  (DRISDOL) 1.25 MG (50000 UNIT) CAPS capsule Take 1 capsule (50,000 Units total) by mouth every 7 (seven) days. 20 capsule 1   albuterol (VENTOLIN HFA) 108 (90 Base) MCG/ACT inhaler INHALE 2 PUFFS BY MOUTH EVERY 6 HOURS AS NEEDED FOR SHORTNESS OF BREATH 9 g 0   No facility-administered medications prior to visit.    Allergies  Allergen Reactions   Gramineae Pollens Itching   Claritin [Loratadine] Other (See Comments)    Heart race   Loratadine-Pseudoephedrine Er Palpitations   Pollen Extract Other (See Comments)    Seasonal allergies    ROS Review of Systems  Constitutional:  Positive for fatigue. Negative for chills and fever.  HENT:  Negative for congestion and sore throat.   Eyes:  Negative for pain and discharge.  Respiratory:  Positive for shortness of breath. Negative for cough.   Cardiovascular:  Positive for palpitations. Negative for chest pain.  Gastrointestinal:  Positive for constipation. Negative for diarrhea, nausea and vomiting.  Endocrine: Negative for polydipsia and polyuria.  Genitourinary:  Negative for dysuria and hematuria.  Musculoskeletal:  Positive for arthralgias, back pain, myalgias and neck pain. Negative for neck stiffness.  Neurological:  Positive for dizziness and tremors. Negative for headaches.  Psychiatric/Behavioral:  Positive for sleep disturbance. Negative for agitation and behavioral problems. The patient is nervous/anxious.  Objective:    Physical Exam Vitals reviewed.  Constitutional:      General: He is not in acute distress.    Appearance: He is not diaphoretic.  HENT:     Head: Normocephalic and atraumatic.     Nose: Nose normal.     Mouth/Throat:     Mouth: Mucous membranes are moist.  Eyes:     General: No scleral icterus.    Extraocular Movements: Extraocular movements intact.  Cardiovascular:     Rate and Rhythm: Normal rate and regular rhythm.     Pulses: Normal pulses.     Heart sounds: Normal heart sounds. No  murmur heard. Pulmonary:     Breath sounds: Normal breath sounds. No wheezing or rales.  Abdominal:     General: Bowel sounds are normal.     Palpations: Abdomen is soft.     Tenderness: There is no abdominal tenderness.  Musculoskeletal:        General: Tenderness (Mid thoracic and lumbar spine area) present.     Cervical back: Neck supple. No tenderness.     Right lower leg: No edema.     Left lower leg: No edema.  Skin:    General: Skin is warm.     Coloration: Skin is not jaundiced.  Neurological:     General: No focal deficit present.     Mental Status: He is alert and oriented to person, place, and time.     Sensory: No sensory deficit.     Motor: No weakness.     Comments: Functional tremors on right hand  Psychiatric:        Mood and Affect: Mood is anxious and depressed. Affect is flat.        Thought Content: Thought content does not include homicidal or suicidal ideation.     BP 126/84 (BP Location: Right Arm, Patient Position: Sitting, Cuff Size: Large)   Pulse 65   Ht 6\' 3"  (1.905 m)   Wt 272 lb 9.6 oz (123.7 kg)   SpO2 97%   BMI 34.07 kg/m  Wt Readings from Last 3 Encounters:  10/13/23 272 lb 9.6 oz (123.7 kg)  10/08/23 260 lb (117.9 kg)  10/01/23 275 lb 1.9 oz (124.8 kg)    Lab Results  Component Value Date   TSH 3.110 12/02/2022   Lab Results  Component Value Date   WBC 9.1 09/29/2023   HGB 15.0 09/29/2023   HCT 45.2 09/29/2023   MCV 92.2 09/29/2023   PLT 266 09/29/2023   Lab Results  Component Value Date   NA 138 09/24/2023   K 4.5 09/24/2023   CO2 21 (L) 09/24/2023   GLUCOSE 98 09/24/2023   BUN 23 (H) 09/24/2023   CREATININE 1.40 (H) 09/24/2023   BILITOT 1.0 09/24/2023   ALKPHOS 64 09/24/2023   AST 46 (H) 09/24/2023   ALT 61 (H) 09/24/2023   PROT 7.5 09/24/2023   ALBUMIN 4.5 09/24/2023   CALCIUM 9.8 09/24/2023   ANIONGAP 11 09/24/2023   EGFR 48 (L) 08/06/2023   GFR 68.05 10/02/2021   Lab Results  Component Value Date   CHOL  128 05/22/2023   Lab Results  Component Value Date   HDL 32 (L) 05/22/2023   Lab Results  Component Value Date   LDLCALC 52 05/22/2023   Lab Results  Component Value Date   TRIG 279 (H) 05/22/2023   Lab Results  Component Value Date   CHOLHDL 4.0 05/22/2023   Lab Results  Component Value  Date   HGBA1C 6.7 08/25/2023      Assessment & Plan:   Problem List Items Addressed This Visit       Cardiovascular and Mediastinum   Essential hypertension    BP Readings from Last 1 Encounters:  10/13/23 126/84   Well-controlled with Amlodipine 5 mg QD On propranolol for PSVT and tremors Counseled for compliance with the medications Advised DASH diet and moderate exercise/walking, at least 150 mins/week        Respiratory   OSA (obstructive sleep apnea)    Uses CPAP regularly Has anxiety while using it, but is overall manageable Followed by pulmonology        Endocrine   Type 2 diabetes mellitus with diabetic chronic kidney disease (HCC) - Primary    Lab Results  Component Value Date   HGBA1C 6.7 08/25/2023   New onset On Jardiance 10 mg QD Advised to follow diabetic diet F/u CMP and lipid panel Diabetic foot exam: Today Diabetic eye exam: Advised to follow up with Ophthalmology for diabetic eye exam  Has CKD stage 3a, followed by Nephrology, needs to improve hydration       Relevant Medications   Blood Glucose Monitoring Suppl DEVI   Glucose Blood (BLOOD GLUCOSE TEST STRIPS) STRP   Lancet Device MISC   Other Relevant Orders   Urine Microalbumin w/creat. ratio   CMP14+EGFR   Hemoglobin A1c   Urine Microalbumin w/creat. ratio     Other   Mixed hyperlipidemia (Chronic)    On Crestor and Fenofibrate Check lipid profile      Relevant Orders   Lipid Profile   Chronic fatigue    Unclear etiology No other B symptoms Has had extensive cardiac and pulmonology workup including echo, stress test, cardiac cath-overall benign Recently has had declining GFR  - check CMP, has seen nephrology      Relevant Orders   TSH + free T4   Meds ordered this encounter  Medications   Blood Glucose Monitoring Suppl DEVI    Sig: 1 each by Does not apply route in the morning, at noon, and at bedtime. May substitute to any manufacturer covered by patient's insurance.    Dispense:  1 each    Refill:  0   Glucose Blood (BLOOD GLUCOSE TEST STRIPS) STRP    Sig: 1 each by In Vitro route in the morning, at noon, and at bedtime. May substitute to any manufacturer covered by patient's insurance.    Dispense:  100 strip    Refill:  1   Lancet Device MISC    Sig: 1 each by Does not apply route in the morning, at noon, and at bedtime. May substitute to any manufacturer covered by patient's insurance.    Dispense:  1 each    Refill:  1    Follow-up: Return in about 3 months (around 01/11/2024) for Annual physical.    Anabel Halon, MD

## 2023-10-13 NOTE — Assessment & Plan Note (Addendum)
Lab Results  Component Value Date   HGBA1C 6.7 08/25/2023   New onset On Jardiance 10 mg QD Advised to follow diabetic diet F/u CMP and lipid panel Diabetic foot exam: Today Diabetic eye exam: Advised to follow up with Ophthalmology for diabetic eye exam  Has CKD stage 3a, followed by Nephrology, needs to improve hydration

## 2023-10-13 NOTE — Assessment & Plan Note (Signed)
Unclear etiology No other B symptoms Has had extensive cardiac and pulmonology workup including echo, stress test, cardiac cath-overall benign Recently has had declining GFR - check CMP, has seen nephrology

## 2023-10-15 ENCOUNTER — Ambulatory Visit (HOSPITAL_COMMUNITY)
Admission: RE | Admit: 2023-10-15 | Discharge: 2023-10-15 | Disposition: A | Discharge status: Home / Self Care | Payer: 59 | Source: Ambulatory Visit | Attending: General Surgery | Admitting: General Surgery

## 2023-10-15 ENCOUNTER — Encounter (HOSPITAL_COMMUNITY): Payer: Self-pay | Admitting: Gastroenterology

## 2023-10-15 ENCOUNTER — Encounter (INDEPENDENT_AMBULATORY_CARE_PROVIDER_SITE_OTHER): Payer: Self-pay | Admitting: Gastroenterology

## 2023-10-15 DIAGNOSIS — K2270 Barrett's esophagus without dysplasia: Secondary | ICD-10-CM | POA: Diagnosis not present

## 2023-10-15 DIAGNOSIS — K921 Melena: Secondary | ICD-10-CM | POA: Diagnosis not present

## 2023-10-15 DIAGNOSIS — K449 Diaphragmatic hernia without obstruction or gangrene: Secondary | ICD-10-CM | POA: Insufficient documentation

## 2023-10-15 LAB — MICROALBUMIN / CREATININE URINE RATIO
Creatinine, Urine: 198.5 mg/dL
Microalb/Creat Ratio: 17 mg/g{creat} (ref 0–29)
Microalbumin, Urine: 34.3 ug/mL

## 2023-10-15 NOTE — Procedures (Signed)
    I personally attempted to reach the patient to discuss the findings but patient did not answer my phone calls.  I left a detailed voice message.  I also sent a message via MyChart.  Katrinka Blazing, MD Gastroenterology and Hepatology [facility]

## 2023-10-17 ENCOUNTER — Telehealth: Payer: Self-pay

## 2023-10-17 ENCOUNTER — Encounter: Payer: Self-pay | Admitting: Internal Medicine

## 2023-10-17 NOTE — Telephone Encounter (Signed)
My chart message sent to patient to see which reader and sensor his insurance will cover

## 2023-10-17 NOTE — Telephone Encounter (Signed)
Copied from CRM [id number]. Topic: Clinical - Medication Question >> Oct 16, 2023 10:58 AM Dimitri Ped wrote: Reason for CRM: patient insurance is calling with patient on back line concerning medication and do the insurance cover medication for diabetes.. Insurance is needing the full name of the prescription that was prescribed by Dr Allena Katz for Diabetes and the Npi number to check the participation of the Dr. [phone] member services number. . They dont have the exact name of the medication and patient doesn't have the name . Need assistance please give a call back

## 2023-10-20 ENCOUNTER — Ambulatory Visit: Payer: 59 | Admitting: Internal Medicine

## 2023-10-21 ENCOUNTER — Telehealth: Payer: Self-pay

## 2023-10-21 ENCOUNTER — Other Ambulatory Visit: Payer: Self-pay

## 2023-10-21 ENCOUNTER — Telehealth: Payer: Self-pay | Admitting: Internal Medicine

## 2023-10-21 NOTE — Telephone Encounter (Signed)
Copied from CRM [id number]. Topic: Clinical - Prescription Issue >> Oct 21, 2023 10:19 AM Tiffany H wrote: Reason for CRM: Patient called to follow up on request for glucose testing patch. Patient advised that insurance wants the clinic to call to get the specific name of the covered options. Patient hasn't checked his blood sugar since 10/13/23. Please assist today.

## 2023-10-21 NOTE — Telephone Encounter (Signed)
Prior Authorization in process

## 2023-10-22 ENCOUNTER — Telehealth: Payer: Self-pay

## 2023-10-22 NOTE — Telephone Encounter (Signed)
Copied from CRM [id number]. Topic: Clinical - Request for Lab/Test Order >> Oct 22, 2023  3:54 PM Dennison Nancy wrote: Reason for CRM: Kendal Hymen from [employer] reaching out regarding the prior authorization free style and was deny, and request to put in for the [facility] sensor that will be covered Call back # [phone]

## 2023-10-23 ENCOUNTER — Other Ambulatory Visit: Payer: Self-pay

## 2023-10-23 MED ORDER — DEXCOM G6 TRANSMITTER MISC: 3 refills | Status: DC

## 2023-10-23 MED ORDER — DEXCOM G6 SENSOR MISC: 5 refills | Status: DC

## 2023-10-23 MED ORDER — DEXCOM G6 RECEIVER DEVI: 0 refills | Status: DC

## 2023-10-23 NOTE — Telephone Encounter (Signed)
Dexcom system sent in.

## 2023-10-28 ENCOUNTER — Ambulatory Visit (INDEPENDENT_AMBULATORY_CARE_PROVIDER_SITE_OTHER): Payer: 59 | Admitting: Gastroenterology

## 2023-10-28 VITALS — BP 116/77 | HR 91 | Temp 98.1°F | Ht 75.0 in | Wt 279.0 lb

## 2023-10-28 DIAGNOSIS — K581 Irritable bowel syndrome with constipation: Secondary | ICD-10-CM | POA: Diagnosis not present

## 2023-10-28 DIAGNOSIS — R140 Abdominal distension (gaseous): Secondary | ICD-10-CM

## 2023-10-28 MED ORDER — LUBIPROSTONE 24 MCG PO CAPS: 24.0000 ug | ORAL_CAPSULE | Freq: Two times a day (BID) | ORAL | 1 refills | Status: DC

## 2023-10-28 NOTE — Patient Instructions (Signed)
We will stop amitiza and increase to twice daily Increase water intake, aim for atleast 64 oz per day Increase fruits, veggies and whole grains, kiwi and prunes are especially good for constipation Proceed with SIBO testing as you are scheduled, we will be in touch once we get these results back Please follow up with Dr. Francena Hanly regarding hiatal hernia repair  Follow up 3-4 months  It was a pleasure to see you today. I want to create trusting relationships with patients and provide genuine, compassionate, and quality care. I truly value your feedback! please be on the lookout for a survey regarding your visit with me today. I appreciate your input about our visit and your time in completing this!    Maanasa Aderhold L. Jeanmarie Hubert, MSN, APRN, AGNP-C Adult-Gerontology Nurse Practitioner [facility] at [facility]

## 2023-10-28 NOTE — Progress Notes (Addendum)
Referring Provider: Anabel Halon, MD Primary Care Physician:  Anabel Halon, MD Primary GI Physician: Dr. Levon Hedger   Chief Complaint  Patient presents with   Constipation    Having to strain to have BM. Takes amitiza 8 mcg bid.    HPI:   Elijah Shaffer is a 42 y.o. male with past medical history of  GERD complicated by long segment nondysplastic Barrett's esophagus, history of diverticulitis and anxiety,  OSA, large hiatal hernia, polyneuropathy, IBS-C   Patient presenting today for follow up of constipation, abdominal pain and bloating  Last seen September 2024, at that time noted abdominal distention for the past 2 months.  Symptoms of shortness of breath due to this.  Having 2-3 BMs per day.  Ongoing liver enzymes since early 2024.  Recommended ultrasound liver elastography, CMP, iron studies and liver serologies  -Ultrasound liver elastography in September with median K PA 2.5, diffuse hepatic steatosis no overt changes of cirrhosis, trace perihepatic ascites, gallbladder sludge -liver serologies WNL   Patient underwent CT abdomen pelvis with contrast x 2 later in September which again showed diverticulosis, hepatic steatosis, sludge or stone in gallbladder neck, no cholecystitis or biliary ductal dilatation, large hiatal hernia.  Started on amitiza BID by PCP in september  EGD in October- Esophageal mucosal changes classified as Barrett's stage C6-M6 per Prague criteria. - 8 cm paraesophageal hernia.- Duodenal diverticulum.  Referred for SIBO testing at [facility] (not yet completed)  Repeat EGD in November with similar findings, capsule endoscopy deployed at that time without significant findings other than scant blood in proximal duodenum after  capsule deployment due to trauma  Seen by Dr. Francena Hanly with gen surgery regarding [facility] repair on 11/21- advised to obtain UGI series and make final decision in 6-8 weeks  Patient had upper GI series on 12/4 with normal  esophagus, normal esophageal motility, no spontaneous GERD moderate to large sized type III paraesophageal hernia, duodenal tiny duodenal diverticulum  Last hgb on 11/18 was 15   Present:  States he is going Thursday to have SIBO testing at [facility]  He is taking amitiza BID, having one BM per day but having to strain a lot still to go. Has not tried the higher dose of amitiza. No rectal bleeding or melena. Appetite is good. He is hungry. Still has some bloating, though somewhat better than previously.   He is supposed to see Dr. Francena Hanly again in January to further discuss [facility] repair.   Last Colonoscopy:05/2021 -  The perianal and digital rectal examinations were normal. Multiple large-mouthed diverticula were found in the sigmoid colon, transverse colon, ascending colon and cecum. Non-bleeding internal hemorrhoids were found during retroflexion. The  hemorrhoids were small.   Recommended to repeat in 5 years due to family history of cancer. Last Endoscopy: as above   Past Medical History:  Diagnosis Date   Acute renal injury (HCC) 06/28/2017   Anxiety    Aspiration pneumonia of right upper lobe due to gastric secretions (HCC)    Diabetes mellitus without complication (HCC)    Diverticulitis    GERD (gastroesophageal reflux disease) 01/08/2018   Hypertension    Prediabetes    Seasonal allergies    Sleep apnea    Syncope 06/26/2021    Past Surgical History:  Procedure Laterality Date   BIOPSY  03/18/2021   Procedure: BIOPSY;  Surgeon: Dolores Frame, MD;  [facility];  Service: Gastroenterology;;  esophageal at Z-line   BIOPSY  05/23/2021   Procedure: BIOPSY;  Surgeon: Dolores Frame, MD;  [facility];  Service: Gastroenterology;;   BIOPSY  08/19/2023   Procedure: BIOPSY;  Surgeon: Marguerita Merles, Reuel Boom, MD;  [facility];  Service: Gastroenterology;;   CARDIOPULMONARY EXERCISE TEST (CPX)  01/09/2022    Interpretation limited by submaximal effort.  Severe functional impairment when compared to max sedentary norms.  No cardiopulmonary limitations.  Noted tremor and generalized weakness that lead to premature exercise termination-consider neuromuscular evaluation.   COLONOSCOPY N/A 09/22/2017   Procedure: COLONOSCOPY;  Surgeon: Malissa Hippo, MD;  [facility];  Service: Endoscopy;  Laterality: N/A;  9:55   COLONOSCOPY WITH PROPOFOL N/A 05/23/2021   Procedure: COLONOSCOPY WITH PROPOFOL;  Surgeon: Dolores Frame, MD;  [facility];  Service: Gastroenterology;  Laterality: N/A;  7:30   ESOPHAGOGASTRODUODENOSCOPY (EGD) WITH PROPOFOL N/A 03/18/2021   Procedure: ESOPHAGOGASTRODUODENOSCOPY (EGD) WITH PROPOFOL;  Surgeon: Dolores Frame, MD;  [facility];  Service: Gastroenterology;  Laterality: N/A;   ESOPHAGOGASTRODUODENOSCOPY (EGD) WITH PROPOFOL N/A 05/23/2021   Procedure: ESOPHAGOGASTRODUODENOSCOPY (EGD) WITH PROPOFOL;  Surgeon: Dolores Frame, MD;  [facility];  Service: Gastroenterology;  Laterality: N/A;   ESOPHAGOGASTRODUODENOSCOPY (EGD) WITH PROPOFOL N/A 08/19/2023   Procedure: ESOPHAGOGASTRODUODENOSCOPY (EGD) WITH PROPOFOL;  Surgeon: Dolores Frame, MD;  [facility];  Service: Gastroenterology;  Laterality: N/A;  11:45AM;ASA 3   ESOPHAGOGASTRODUODENOSCOPY (EGD) WITH PROPOFOL N/A 10/08/2023   Procedure: ESOPHAGOGASTRODUODENOSCOPY (EGD) WITH PROPOFOL;  Surgeon: Dolores Frame, MD;  [facility];  Service: Gastroenterology;  Laterality: N/A;  1:45PM;ASA 1-2   GIVENS CAPSULE STUDY N/A 10/08/2023   Procedure: GIVENS CAPSULE STUDY;  Surgeon: Dolores Frame, MD;  [facility];  Service: Gastroenterology;  Laterality: N/A;  1:45PM;ASA 1-2   POLYPECTOMY  09/22/2017   Procedure: POLYPECTOMY;  Surgeon: Malissa Hippo, MD;  [facility];  Service:  Endoscopy;;   RIGHT/LEFT HEART CATH AND CORONARY ANGIOGRAPHY N/A 12/12/2021   Procedure: RIGHT/LEFT HEART CATH AND CORONARY ANGIOGRAPHY;  Surgeon: Dolores Patty, MD;  [facility];  Service: Cardiovascular:: Normal Coronaries & Hemodynamics.  EF 50-55%. RA = 4 mmHg, RV = 23/4; PA = 22/6 (14) & PCW = 7; Ao sat = 95%; PA sat = 76%, 76%& SVC sat = 74%Fick cardiac output/index = 7.1/3.0; PVR = 1.0 WU   TRANSTHORACIC ECHOCARDIOGRAM  02/24/2021   EF 60 to 65%.  Normal LV function.  Normal valves.  Mild RV dilation with normal function.   ZIO PATCH EVENT MONITOR  01/2022   Sinus rhythm with heart rate range 24 to 159 bpm, 1  AVB and Wenckebach block noted along with rare PACs and PVCs.  No arrhythmias.    Current Outpatient Medications  Medication Sig Dispense Refill   albuterol (VENTOLIN HFA) 108 (90 Base) MCG/ACT inhaler INHALE 2 PUFFS BY MOUTH EVERY 6 HOURS AS NEEDED FOR SHORTNESS OF BREATH 9 g 0   amLODipine (NORVASC) 5 MG tablet Take 1 tablet by mouth once daily 90 tablet 0   b complex vitamins capsule Take 1 capsule by mouth daily. With D3 gummie     Blood Glucose Monitoring Suppl DEVI 1 each by Does not apply route in the morning, at noon, and at bedtime. May substitute to any manufacturer covered by patient's insurance. 1 each 0   cetirizine (ZYRTEC) 10 MG tablet Take 10 mg by mouth daily.     Cholecalciferol (  VITAMIN D-3 PO) Take by mouth daily at 6 (six) AM.     clindamycin (CLEOCIN T) 1 % external solution Apply topically to acne bumps at aa's qd 30 mL 3   clobetasol (TEMOVATE) 0.05 % external solution Apply 1 application. topically 2 (two) times daily. Use as directed.  Mix with cerave cream. Use for up to 4 weeks. Avoid applying to face, groin, and axilla. Use as directed. 50 mL 1   Continuous Glucose Receiver (DEXCOM G6 RECEIVER) DEVI Use to check blood sugar daily DX e11.65 1 each 0   Continuous Glucose Sensor (DEXCOM G6 SENSOR) MISC Use to check blood sugar daily  DX e11.65 3 each 5   Continuous Glucose Transmitter (DEXCOM G6 TRANSMITTER) MISC Use to check blood sugar daily DX e11.65 1 each 3   cyclobenzaprine (FLEXERIL) 5 MG tablet Take 1 tablet (5 mg total) by mouth at bedtime as needed for muscle spasms. 30 tablet 1   dicyclomine (BENTYL) 20 MG tablet Take 1 tablet (20 mg total) by mouth 2 (two) times daily as needed for spasms. 20 tablet 0   diphenhydrAMINE-zinc acetate (BENADRYL EXTRA STRENGTH) cream Apply 1 application topically 3 (three) times daily as needed for itching. 28.4 g 0   docusate sodium (COLACE) 100 MG capsule Take 100 mg by mouth 2 (two) times daily.     empagliflozin (JARDIANCE) 10 MG TABS tablet Take 1 tablet (10 mg total) by mouth daily before breakfast. 60 tablet 1   fenofibrate (TRICOR) 145 MG tablet Take 1 tablet (145 mg total) by mouth daily. 90 tablet 3   fluocinonide (LIDEX) 0.05 % external solution Apply once or twice daily to affected areas on scalp up to 2 weeks as needed for itching. Avoid applying to face, groin, and axilla. 60 mL 2   fluticasone (FLONASE ALLERGY RELIEF) 50 MCG/ACT nasal spray Place 2 sprays into both nostrils daily as needed for allergies or rhinitis. (Patient taking differently: Place 2 sprays into both nostrils daily.) 9.9 mL 2   gabapentin (NEURONTIN) 300 MG capsule Take 300 mg by mouth 3 (three) times daily.     Glucose Blood (BLOOD GLUCOSE TEST STRIPS) STRP 1 each by In Vitro route in the morning, at noon, and at bedtime. May substitute to any manufacturer covered by patient's insurance. 100 strip 1   hydrOXYzine (VISTARIL) 25 MG capsule TAKE 1 or 2 CAPSULES BY MOUTH BEFORE BED AS NEEDED FOR ITCHING. CAN CAUSE DROWSINESS. 60 capsule 2   ketoconazole (NIZORAL) 2 % shampoo Apply 1 Application topically daily as needed for irritation. 120 mL 11   Lancet Device MISC 1 each by Does not apply route in the morning, at noon, and at bedtime. May substitute to any manufacturer covered by patient's insurance. 1 each  1   lubiprostone (AMITIZA) 8 MCG capsule Take 1 capsule (8 mcg total) by mouth 2 (two) times daily with a meal. 60 capsule 3   montelukast (SINGULAIR) 10 MG tablet TAKE 1 TABLET BY MOUTH AT BEDTIME 90 tablet 3   omega-3 acid ethyl esters (LOVAZA) 1 g capsule Take 2 capsules (2 g total) by mouth 2 (two) times daily. 120 capsule 3   omeprazole (PRILOSEC) 40 MG capsule Take 1 capsule by mouth twice daily 180 capsule 0   ondansetron (ZOFRAN) 4 MG tablet Take 1 tablet (4 mg total) by mouth every 6 (six) hours as needed for nausea. 20 tablet 0   propranolol (INDERAL) 20 MG tablet Take one tablet 20 mg  twice daily  may take an  additional dose of 20 mg  as needed for palpitations 190 tablet 3   rosuvastatin (CRESTOR) 40 MG tablet Take 1 tablet (40 mg total) by mouth daily. 90 tablet 3   Testosterone 25 MG/2.5GM (1%) GEL Place 2.5 g onto the skin in the morning. 75 g 5   triamcinolone cream (KENALOG) 0.1 %      Vitamin D, Ergocalciferol, (DRISDOL) 1.25 MG (50000 UNIT) CAPS capsule Take 1 capsule (50,000 Units total) by mouth every 7 (seven) days. 20 capsule 1   No current facility-administered medications for this visit.    Allergies as of 10/28/2023 - Review Complete 10/13/2023  Allergen Reaction Noted   Gramineae pollens Itching 05/10/2022   Claritin [loratadine] Other (See Comments) 02/08/2017   Loratadine-pseudoephedrine er Palpitations 01/30/2022   Pollen extract Other (See Comments) 06/26/2021    Family History  Problem Relation Age of Onset   Healthy Mother    Cervical cancer Mother    Cancer Father    Colon cancer Maternal Grandmother    Colon cancer Maternal Grandfather     Social History   Socioeconomic History   Marital status: Single    Spouse name: Not on file   Number of children: Not on file   Years of education: Not on file   Highest education level: Not on file  Occupational History   Not on file  Tobacco Use   Smoking status: Never    Passive exposure: Past    Smokeless tobacco: Never  Vaping Use   Vaping status: Never Used  Substance and Sexual Activity   Alcohol use: Not Currently    Comment: occasionally   Drug use: No   Sexual activity: Not on file  Other Topics Concern   Not on file  Social History Narrative   He currently has his own lawn care landscaping service. He previous had 80 yards before he got sick and since decreased. He works around a Interior and spatial designer. One of the chemical names is roundup.    Social Drivers of Corporate investment banker Strain: Not on file  Food Insecurity: No Food Insecurity (06/20/2023)   Hunger Vital Sign    Worried About Running Out of Food in the Last Year: Never true    Ran Out of Food in the Last Year: Never true  Transportation Needs: No Transportation Needs (12/27/2022)   PRAPARE - Administrator, Civil Service (Medical): No    Lack of Transportation (Non-Medical): No  Physical Activity: Insufficiently Active (03/29/2022)   Exercise Vital Sign    Days of Exercise per Week: 2 days    Minutes of Exercise per Session: 10 min  Stress: Stress Concern Present (05/07/2022)   [employer] of Occupational Health - Occupational Stress Questionnaire    Feeling of Stress : To some extent  Social Connections: Not on file   Review of systems General: negative for malaise, night sweats, fever, chills, weight loss Neck: Negative for lumps, goiter, pain and significant neck swelling Resp: Negative for cough, wheezing, dyspnea at rest CV: Negative for chest pain, leg swelling, palpitations, orthopnea GI: denies melena, hematochezia, nausea, vomiting, diarrhea,  dysphagia, odyonophagia, early satiety or unintentional weight loss. +constipation +abdominal bloating  MSK: Negative for joint pain or swelling, back pain, and muscle pain. Derm: Negative for itching or rash Psych: Denies depression, anxiety, memory loss, confusion. No homicidal or suicidal ideation.  Heme: Negative for prolonged  bleeding, bruising easily, and swollen nodes. Endocrine: Negative for  cold or heat intolerance, polyuria, polydipsia and goiter. Neuro: negative for tremor, gait imbalance, syncope and seizures. The remainder of the review of systems is noncontributory.  Physical Exam: BP 116/77 (BP Location: Left Arm, Patient Position: Sitting, Cuff Size: Large)   Pulse 91   Temp 98.1 F (36.7 C) (Oral)   Ht 6\' 3"  (1.905 m)   Wt 279 lb (126.6 kg)   BMI 34.87 kg/m  General:   Alert and oriented. No distress noted. Pleasant and cooperative.  Head:  Normocephalic and atraumatic. Eyes:  Conjuctiva clear without scleral icterus. Mouth:  Oral mucosa pink and moist. Good dentition. No lesions. Heart: Normal rate and rhythm, s1 and s2 heart sounds present.  Lungs: Clear lung sounds in all lobes. Respirations equal and unlabored. Abdomen:  +BS, soft, non-tender and non-distended. No rebound or guarding. No HSM or masses noted. Derm: No palmar erythema or jaundice Msk:  Symmetrical without gross deformities. Normal posture. Extremities:  Without edema. Neurologic:  Alert and  oriented x4 Psych:  Alert and cooperative. Normal mood and affect.  Invalid input(s): "6 MONTHS"   ASSESSMENT: NEWEL CLEERE is a 42 y.o. male presenting today for follow up of bloating and IBS with constipation  Bloating: Patient with extensive workup as outlined above for abdominal bloating without significant findings to explain his symptoms.  Has upcoming SIBO test.  Does note bloating feels somewhat better though still still experiencing some issues with this.  No nausea, vomiting, changes in appetite, weight loss, early satiety.  Further recommendations to follow once SIBO testing has resulted.  He also has follow-up with general surgery regarding hiatal hernia repair in January which I encouraged him to keep.  IBS with constipation: Currently on Amitiza 8 mcg twice daily, having a bowel movement daily though noting he still has  to strain quite a bit as stools are very hard and large.  No rectal bleeding or melena.  Recommend increasing Amitiza to 24 mcg twice daily.  Needs to ensure he is drinking plenty of water, diet high in fruits, veggies, whole grains.  PLAN:  Increase amitiza to BID  2. Proceed with SIBO testing as scheduled  3. Increase water intake, aim for atleast 64 oz per day Increase fruits, veggies and whole grains, kiwi and prunes are especially good for constipation 4. Keep follow up with gen surg regarding [facility] repair   All questions were answered, patient verbalized understanding and is in agreement with plan as outlined above.   Follow Up: 3-4 months   Elijah Self L. Jeanmarie Hubert, MSN, APRN, AGNP-C Adult-Gerontology Nurse Practitioner [facility]  I have reviewed the note and agree with the APP's assessment as described in this progress note  Katrinka Blazing, MD Gastroenterology and Hepatology [facility] Gastroenterology

## 2023-10-29 ENCOUNTER — Encounter (INDEPENDENT_AMBULATORY_CARE_PROVIDER_SITE_OTHER): Payer: Self-pay | Admitting: *Deleted

## 2023-10-29 ENCOUNTER — Telehealth (INDEPENDENT_AMBULATORY_CARE_PROVIDER_SITE_OTHER): Payer: Self-pay | Admitting: *Deleted

## 2023-10-29 NOTE — Telephone Encounter (Signed)
Patient thought he was getting a med to drink to help move his bowels.  He said [facility] did not have rx.   [phone]

## 2023-10-29 NOTE — Telephone Encounter (Signed)
Left message to return call 

## 2023-10-30 DIAGNOSIS — R140 Abdominal distension (gaseous): Secondary | ICD-10-CM | POA: Diagnosis not present

## 2023-10-30 DIAGNOSIS — R197 Diarrhea, unspecified: Secondary | ICD-10-CM | POA: Diagnosis not present

## 2023-10-30 DIAGNOSIS — R1084 Generalized abdominal pain: Secondary | ICD-10-CM | POA: Diagnosis not present

## 2023-10-30 DIAGNOSIS — R142 Eructation: Secondary | ICD-10-CM | POA: Diagnosis not present

## 2023-10-30 DIAGNOSIS — K5900 Constipation, unspecified: Secondary | ICD-10-CM | POA: Diagnosis not present

## 2023-10-31 ENCOUNTER — Other Ambulatory Visit: Payer: 59

## 2023-10-31 DIAGNOSIS — E291 Testicular hypofunction: Secondary | ICD-10-CM | POA: Diagnosis not present

## 2023-11-01 LAB — HEMOGLOBIN AND HEMATOCRIT, BLOOD
Hematocrit: 49.2 % (ref 37.5–51.0)
Hemoglobin: 16.2 g/dL (ref 13.0–17.7)

## 2023-11-01 LAB — TESTOSTERONE: Testosterone: 318 ng/dL (ref 264–916)

## 2023-11-07 ENCOUNTER — Telehealth (INDEPENDENT_AMBULATORY_CARE_PROVIDER_SITE_OTHER): Payer: Self-pay | Admitting: Gastroenterology

## 2023-11-07 ENCOUNTER — Other Ambulatory Visit: Payer: 59

## 2023-11-07 NOTE — Telephone Encounter (Signed)
Hi Crystal,  Can you please call the patient and tell the patient the SIBO breath testing was normal?  Thanks,  Katrinka Blazing, MD Gastroenterology and Hepatology [facility] Gastroenterology

## 2023-11-10 ENCOUNTER — Encounter (INDEPENDENT_AMBULATORY_CARE_PROVIDER_SITE_OTHER): Payer: Self-pay

## 2023-11-10 ENCOUNTER — Other Ambulatory Visit: Payer: Self-pay | Admitting: Internal Medicine

## 2023-11-10 DIAGNOSIS — R0609 Other forms of dyspnea: Secondary | ICD-10-CM

## 2023-11-10 NOTE — Telephone Encounter (Signed)
November 10, 2023 Me to Elijah Shaffer      11/10/23  8:24 AM Hello [name], hope you had a wonderful holiday! Dr. Levon Hedger wanted me to let you know the SIBO breath testing was normal?   Thanks,  Last read by Beverly Gust at  8:26 AM on 11/10/2023.

## 2023-11-10 NOTE — Telephone Encounter (Signed)
My Chart Message sent to the patient stating: Hello Elijah Shaffer, hope you had a wonderful holiday! Dr. Levon Hedger wanted me to let you know the SIBO breath testing was normal?   Thanks,

## 2023-11-13 ENCOUNTER — Encounter: Payer: Self-pay | Admitting: Pulmonary Disease

## 2023-11-13 ENCOUNTER — Ambulatory Visit: Payer: 59 | Admitting: Urology

## 2023-11-13 ENCOUNTER — Encounter: Payer: Self-pay | Admitting: Urology

## 2023-11-13 VITALS — BP 117/78 | HR 94

## 2023-11-13 DIAGNOSIS — R5383 Other fatigue: Secondary | ICD-10-CM

## 2023-11-13 DIAGNOSIS — E291 Testicular hypofunction: Secondary | ICD-10-CM

## 2023-11-13 DIAGNOSIS — Z125 Encounter for screening for malignant neoplasm of prostate: Secondary | ICD-10-CM

## 2023-11-13 LAB — URINALYSIS, ROUTINE W REFLEX MICROSCOPIC
Bilirubin, UA: NEGATIVE
Ketones, UA: NEGATIVE
Leukocytes,UA: NEGATIVE
Nitrite, UA: NEGATIVE
Protein,UA: NEGATIVE
RBC, UA: NEGATIVE
Specific Gravity, UA: 1.025 (ref 1.005–1.030)
Urobilinogen, Ur: 0.2 mg/dL (ref 0.2–1.0)
pH, UA: 6 (ref 5.0–7.5)

## 2023-11-13 MED ORDER — TESTOSTERONE 25 MG/2.5GM (1%) TD GEL: 5.0000 g | Freq: Every morning | TRANSDERMAL | 5 refills | Status: DC

## 2023-11-13 NOTE — Progress Notes (Signed)
 Subjective: 1. Hypogonadism in male   2. Prostate cancer screening   3. Other fatigue       11/13/23: Elijah Shaffer returns today in f/u. He is back on a topical TRT agent.  His energy level is good.  His Hgb is 16.2 and the T is 318. He has no ED but is not very active.   He is voiding well with an IPSS of 2.  He has 3+ glucose on UA today.   07/10/23 Elijah Shaffer returns today in f/u for his history of hypogonadism.  He was prescribed Testim  at his last visit but it wasn't covered so he has transitioned to injections.  .His T prior to this visit was 723 with a  Hgb of 16.8.  He has had some bleeding with the injections and is interested in looking at the injections again.  His last Cr was up to 2.18 with a GFR of 38 at is last visit which was worse than in May when it was 58.    04/10/23: Elijah Shaffer returns today in f/u for his history of hypogonadism.  His repeat T on 02/06/23 was 356 with a free T of 91.6 but a repeat prior to this visit is 254.  LH/FSH are on the lower side of normal and his prolactin and estradiol  were normal.   He has some issues with fatigue but it is intermittent.    02/06/23: Elijah Shaffer is a 43 yo male who is sent for low testosterone  of 157 on his labs from 12/24/22.  He was having fatigue and reduced energy.  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  He doesn't report issues with reduced libido or erections but he is not active.  His Hgb was 18.2 on 12/23/22 but it was back down to 16.1 on 01/07/23 and that was felt to be related to improper use of his CPAP that he is on for OSA.  He is on gabapentin for sciatica.   He has had no recent weight loss.  His TSH was normal on 12/02/22.  He has CKD3 and hyperlipidemia.  His IPSS is 1. With nocturia x 1.   ROS:  Review of Systems  Constitutional:  Positive for malaise/fatigue.  HENT:  Positive for congestion.   Neurological:  Positive for tingling.    Allergies  Allergen Reactions   Gramineae  Pollens Itching   Claritin [Loratadine] Other (See Comments)    Heart race   Loratadine-Pseudoephedrine Er Palpitations   Pollen Extract Other (See Comments)    Seasonal allergies    Past Medical History:  Diagnosis Date   Acute renal injury (HCC) 06/28/2017   Anxiety    Aspiration pneumonia of right upper lobe due to gastric secretions (HCC)    Diabetes mellitus without complication (HCC)    Diverticulitis    GERD (gastroesophageal reflux disease) 01/08/2018   Hypertension    Prediabetes    Seasonal allergies    Sleep apnea    Syncope 06/26/2021    Past Surgical History:  Procedure Laterality Date   BIOPSY  03/18/2021   Procedure: BIOPSY;  Surgeon: Eartha Angelia Sieving, MD;  [facility];  Service: Gastroenterology;;  esophageal at Z-line   BIOPSY  05/23/2021   Procedure: BIOPSY;  Surgeon: Eartha Angelia Sieving, MD;  [facility];  Service: Gastroenterology;;   BIOPSY  08/19/2023   Procedure: BIOPSY;  Surgeon: Eartha Angelia Sieving, MD;  [facility];  Service:  Gastroenterology;;   CARDIOPULMONARY EXERCISE TEST (CPX)  01/09/2022   Interpretation limited by submaximal effort.  Severe functional impairment when compared to max sedentary norms.  No cardiopulmonary limitations.  Noted tremor and generalized weakness that lead to premature exercise termination-consider neuromuscular evaluation.   COLONOSCOPY N/A 09/22/2017   Procedure: COLONOSCOPY;  Surgeon: Golda Claudis PENNER, MD;  [facility];  Service: Endoscopy;  Laterality: N/A;  9:55   COLONOSCOPY WITH PROPOFOL  N/A 05/23/2021   Procedure: COLONOSCOPY WITH PROPOFOL ;  Surgeon: Eartha Angelia Sieving, MD;  [facility];  Service: Gastroenterology;  Laterality: N/A;  7:30   ESOPHAGOGASTRODUODENOSCOPY (EGD) WITH PROPOFOL  N/A 03/18/2021   Procedure: ESOPHAGOGASTRODUODENOSCOPY (EGD) WITH PROPOFOL ;  Surgeon: Eartha Angelia Sieving, MD;  [facility];   Service: Gastroenterology;  Laterality: N/A;   ESOPHAGOGASTRODUODENOSCOPY (EGD) WITH PROPOFOL  N/A 05/23/2021   Procedure: ESOPHAGOGASTRODUODENOSCOPY (EGD) WITH PROPOFOL ;  Surgeon: Eartha Angelia Sieving, MD;  [facility];  Service: Gastroenterology;  Laterality: N/A;   ESOPHAGOGASTRODUODENOSCOPY (EGD) WITH PROPOFOL  N/A 08/19/2023   Procedure: ESOPHAGOGASTRODUODENOSCOPY (EGD) WITH PROPOFOL ;  Surgeon: Eartha Angelia Sieving, MD;  [facility];  Service: Gastroenterology;  Laterality: N/A;  11:45AM;ASA 3   ESOPHAGOGASTRODUODENOSCOPY (EGD) WITH PROPOFOL  N/A 10/08/2023   Procedure: ESOPHAGOGASTRODUODENOSCOPY (EGD) WITH PROPOFOL ;  Surgeon: Eartha Angelia Sieving, MD;  [facility];  Service: Gastroenterology;  Laterality: N/A;  1:45PM;ASA 1-2   GIVENS CAPSULE STUDY N/A 10/08/2023   Procedure: GIVENS CAPSULE STUDY;  Surgeon: Eartha Angelia Sieving, MD;  [facility];  Service: Gastroenterology;  Laterality: N/A;  1:45PM;ASA 1-2   POLYPECTOMY  09/22/2017   Procedure: POLYPECTOMY;  Surgeon: Golda Claudis PENNER, MD;  [facility];  Service: Endoscopy;;   RIGHT/LEFT HEART CATH AND CORONARY ANGIOGRAPHY N/A 12/12/2021   Procedure: RIGHT/LEFT HEART CATH AND CORONARY ANGIOGRAPHY;  Surgeon: Cherrie Sieving SAUNDERS, MD;  [facility];  Service: Cardiovascular:: Normal Coronaries & Hemodynamics.  EF 50-55%. RA = 4 mmHg, RV = 23/4; PA = 22/6 (14) & PCW = 7; Ao sat = 95%; PA sat = 76%, 76%& SVC sat = 74%Fick cardiac output/index = 7.1/3.0; PVR = 1.0 WU   TRANSTHORACIC ECHOCARDIOGRAM  02/24/2021   EF 60 to 65%.  Normal LV function.  Normal valves.  Mild RV dilation with normal function.   ZIO PATCH EVENT MONITOR  01/2022   Sinus rhythm with heart rate range 24 to 159 bpm, 1  AVB and Wenckebach block noted along with rare PACs and PVCs.  No arrhythmias.    Social History   Socioeconomic History   Marital status: Single    Spouse name: Not  on file   Number of children: Not on file   Years of education: Not on file   Highest education level: Not on file  Occupational History   Not on file  Tobacco Use   Smoking status: Never    Passive exposure: Past   Smokeless tobacco: Never  Vaping Use   Vaping status: Never Used  Substance and Sexual Activity   Alcohol use: Not Currently    Comment: occasionally   Drug use: No   Sexual activity: Not on file  Other Topics Concern   Not on file  Social History Narrative   He currently has his own lawn care landscaping service. He previous had 80 yards before he got sick and since decreased. He works around a interior and spatial designer. One of the chemical names is [employer]up.    Social Drivers  of Health   Financial Resource Strain: Not on file  Food Insecurity: No Food Insecurity (06/20/2023)   Hunger Vital Sign    Worried About Running Out of Food in the Last Year: Never true    Ran Out of Food in the Last Year: Never true  Transportation Needs: No Transportation Needs (12/27/2022)   PRAPARE - Administrator, Civil Service (Medical): No    Lack of Transportation (Non-Medical): No  Physical Activity: Insufficiently Active (03/29/2022)   Exercise Vital Sign    Days of Exercise per Week: 2 days    Minutes of Exercise per Session: 10 min  Stress: Stress Concern Present (05/07/2022)   [employer] of Occupational Health - Occupational Stress Questionnaire    Feeling of Stress : To some extent  Social Connections: Not on file  Intimate Partner Violence: Not At Risk (06/20/2023)   Humiliation, Afraid, Rape, and Kick questionnaire    Fear of Current or Ex-Partner: No    Emotionally Abused: No    Physically Abused: No    Sexually Abused: No    Family History  Problem Relation Age of Onset   Healthy Mother    Cervical cancer Mother    Cancer Father    Colon cancer Maternal Grandmother    Colon cancer Maternal Grandfather     Anti-infectives: Anti-infectives (From  admission, onward)    None       Current Outpatient Medications  Medication Sig Dispense Refill   amLODipine  (NORVASC ) 5 MG tablet Take 1 tablet by mouth once daily 90 tablet 0   b complex vitamins capsule Take 1 capsule by mouth daily. With D3 gummie     Blood Glucose Monitoring Suppl DEVI 1 each by Does not apply route in the morning, at noon, and at bedtime. May substitute to any manufacturer covered by patient's insurance. 1 each 0   cetirizine (ZYRTEC) 10 MG tablet Take 10 mg by mouth daily.     Cholecalciferol (VITAMIN D -3 PO) Take by mouth daily at 6 (six) AM.     clindamycin  (CLEOCIN  T) 1 % external solution Apply topically to acne bumps at aa's qd 30 mL 3   clobetasol  (TEMOVATE ) 0.05 % external solution Apply 1 application. topically 2 (two) times daily. Use as directed.  Mix with cerave cream. Use for up to 4 weeks. Avoid applying to face, groin, and axilla. Use as directed. 50 mL 1   Continuous Glucose Receiver (DEXCOM G6 RECEIVER) DEVI Use to check blood sugar daily DX e11.65 1 each 0   Continuous Glucose Sensor (DEXCOM G6 SENSOR) MISC Use to check blood sugar daily DX e11.65 3 each 5   Continuous Glucose Transmitter (DEXCOM G6 TRANSMITTER) MISC Use to check blood sugar daily DX e11.65 1 each 3   cyclobenzaprine  (FLEXERIL ) 5 MG tablet Take 1 tablet (5 mg total) by mouth at bedtime as needed for muscle spasms. 30 tablet 1   diphenhydrAMINE -zinc  acetate (BENADRYL  EXTRA STRENGTH) cream Apply 1 application topically 3 (three) times daily as needed for itching. 28.4 g 0   docusate sodium  (COLACE) 100 MG capsule Take 100 mg by mouth 2 (two) times daily.     empagliflozin  (JARDIANCE ) 10 MG TABS tablet Take 1 tablet (10 mg total) by mouth daily before breakfast. 60 tablet 1   fenofibrate  (TRICOR ) 145 MG tablet Take 1 tablet (145 mg total) by mouth daily. 90 tablet 3   fluocinonide  (LIDEX ) 0.05 % external solution Apply once or twice daily to affected areas on  scalp up to 2 weeks as  needed for itching. Avoid applying to face, groin, and axilla. 60 mL 2   gabapentin (NEURONTIN) 300 MG capsule Take 300 mg by mouth 3 (three) times daily.     Glucose Blood (BLOOD GLUCOSE TEST STRIPS) STRP 1 each by In Vitro route in the morning, at noon, and at bedtime. May substitute to any manufacturer covered by patient's insurance. 100 strip 1   hydrOXYzine  (VISTARIL ) 25 MG capsule TAKE 1 or 2 CAPSULES BY MOUTH BEFORE BED AS NEEDED FOR ITCHING. CAN CAUSE DROWSINESS. 60 capsule 2   ketoconazole  (NIZORAL ) 2 % shampoo Apply 1 Application topically daily as needed for irritation. 120 mL 11   lubiprostone  (AMITIZA ) 24 MCG capsule Take 1 capsule (24 mcg total) by mouth 2 (two) times daily with a meal. 60 capsule 1   montelukast  (SINGULAIR ) 10 MG tablet TAKE 1 TABLET BY MOUTH AT BEDTIME 90 tablet 3   omega-3 acid ethyl esters (LOVAZA ) 1 g capsule Take 2 capsules (2 g total) by mouth 2 (two) times daily. 120 capsule 3   omeprazole  (PRILOSEC) 40 MG capsule Take 1 capsule by mouth twice daily 180 capsule 0   ondansetron  (ZOFRAN ) 4 MG tablet Take 1 tablet (4 mg total) by mouth every 6 (six) hours as needed for nausea. 20 tablet 0   propranolol  (INDERAL ) 20 MG tablet Take one tablet 20 mg  twice daily  may take an  additional dose of 20 mg  as needed for palpitations 190 tablet 3   rosuvastatin  (CRESTOR ) 40 MG tablet Take 1 tablet (40 mg total) by mouth daily. 90 tablet 3   triamcinolone  cream (KENALOG ) 0.1 %      Vitamin D , Ergocalciferol , (DRISDOL ) 1.25 MG (50000 UNIT) CAPS capsule Take 1 capsule (50,000 Units total) by mouth every 7 (seven) days. 20 capsule 1   fluticasone  (FLONASE  ALLERGY RELIEF) 50 MCG/ACT nasal spray Place 2 sprays into both nostrils daily as needed for allergies or rhinitis. 16 mL 2   Testosterone  25 MG/2.5GM (1%) GEL Place 5 g onto the skin in the morning. Apply 1 packet to each shoulder qam 150 g 5   No current facility-administered medications for this visit.      Objective: Vital signs in last 24 hours: BP 117/78   Pulse 94   Intake/Output from previous day: No intake/output data recorded. Intake/Output this shift: @IOTHISSHIFT @   Physical Exam  Lab Results:  Results for orders placed or performed in visit on 11/13/23 (from the past 24 hours)  Urinalysis, Routine w reflex microscopic     Status: Abnormal   Collection Time: 11/13/23  3:06 PM  Result Value Ref Range   Specific Gravity, UA 1.025 1.005 - 1.030   pH, UA 6.0 5.0 - 7.5   Color, UA Yellow Yellow   Appearance Ur Clear Clear   Leukocytes,UA Negative Negative   Protein,UA Negative Negative/Trace   Glucose, UA 3+ (A) Negative   Ketones, UA Negative Negative   RBC, UA Negative Negative   Bilirubin, UA Negative Negative   Urobilinogen, Ur 0.2 0.2 - 1.0 mg/dL   Nitrite, UA Negative Negative   Microscopic Examination Comment    Narrative   Performed at:  [address] [facility] [address] [place], [place]  [id number] Lab Director: Cherlyn Ore MT, Phone:  [phone]    BMET No results for input(s): NA, K, CL, CO2, GLUCOSE, BUN, CREATININE, CALCIUM  in the last 72 hours. PT/INR No results for input(s): LABPROT, INR in  the last 72 hours. ABG No results for input(s): PHART, HCO3 in the last 72 hours.  Invalid input(s): PCO2, PO2 UA has 1+ protein. Recent Results (from the past 2160 hours)  Surgical pathology     Status: None   Collection Time: 08/19/23 10:57 AM  Result Value Ref Range   SURGICAL PATHOLOGY      SURGICAL PATHOLOGY CASE: [id number] PATIENT: Geovany Silfies Surgical Pathology Report     Clinical History: Abdominal bloating, discomfort (crm)     FINAL MICROSCOPIC DIAGNOSIS:  A. SMALL BOWEL, BIOPSY: - Benign small bowel mucosa with no significant pathologic changes   GROSS DESCRIPTION:  Received in formalin are tan, soft tissue fragments that are submitted in toto. Number: 6 size: 0.1 to 0.2 cm  blocks: 1 (KW, 08/19/2023)    Final Diagnosis performed by Ilsa Pottier, MD.   Electronically signed 08/20/2023 Technical component performed at [facility], [address], [place], [place] [address].  Professional component performed at [facility], [address], [place], [place] [address].  Immunohistochemistry Technical component (if applicable) was performed at [facility]. [address], [address], [place], [place] [address].   IMMUNOHISTOCHEMISTRY DISCLAIMER (if applicable): Some of these im munohistochemical stains may have been developed and the performance characteristics determine by [facility]. Some may not have been cleared or approved by the U.S. Food and Drug Administration. The FDA has determined that such clearance or approval is not necessary. This test is used for clinical purposes. It should not be regarded as investigational or for research. This laboratory is certified under the Clinical Laboratory Improvement Amendments of 1988 (CLIA-88) as qualified to perform high complexity clinical laboratory testing.  The controls stained appropriately.   IHC stains are performed on formalin fixed, paraffin embedded tissue using a 3,3diaminobenzidine (DAB) chromogen and Leica Bond Autostainer System. The staining intensity of the nucleus is score manually and is reported as the percentage of tumor cell nuclei demonstrating specific nuclear staining. The specimens are fixed in 10% Neutral Formalin for at least 6 hours and up to 72hrs. These  tests are validated on decalcified tissue. Results should be interpreted with caution given the possibility of false negative results on decalcified specimens. Antibody Clones are as follows ER-clone 6F, PR-clone 16, Ki67- clone MM1. Some of these immunohistochemical stains may have been developed and the performance characteristics determined by [facility]  Pathology.   Hemoglobin A1c     Status: None   Collection Time: 08/25/23 12:00 AM  Result Value Ref Range   Hemoglobin A1C 6.7   CBC with Differential     Status: Abnormal   Collection Time: 09/24/23 12:07 PM  Result Value Ref Range   WBC 7.8 4.0 - 10.5 K/uL   RBC 5.18 4.22 - 5.81 MIL/uL   Hemoglobin 15.8 13.0 - 17.0 g/dL   HCT 53.0 60.9 - 47.9 %   MCV 90.5 80.0 - 100.0 fL   MCH 30.5 26.0 - 34.0 pg   MCHC 33.7 30.0 - 36.0 g/dL   RDW 86.9 88.4 - 84.4 %   Platelets  150 - 400 K/uL    PLATELET CLUMPS NOTED ON SMEAR, COUNT APPEARS ADEQUATE   nRBC 0.0 0.0 - 0.2 %   Neutrophils Relative % 55 %   Neutro Abs 4.3 1.7 - 7.7 K/uL   Lymphocytes Relative 34 %   Lymphs Abs 2.6 0.7 - 4.0 K/uL   Monocytes Relative 8 %   Monocytes Absolute 0.6 0.1 - 1.0 K/uL  Eosinophils Relative 1 %   Eosinophils Absolute 0.1 0.0 - 0.5 K/uL   Basophils Relative 1 %   Basophils Absolute 0.1 0.0 - 0.1 K/uL   WBC Morphology MORPHOLOGY UNREMARKABLE    RBC Morphology MORPHOLOGY UNREMARKABLE    Smear Review PLATELET CLUMPS NOTED ON SMEAR, UNABLE TO ESTIMATE    Immature Granulocytes 1 %   Abs Immature Granulocytes 0.08 (H) 0.00 - 0.07 K/uL    Comment: Performed at [facility], [address] [place], [place] [address]  Type and screen [facility]     Status: None   Collection Time: 09/24/23 12:07 PM  Result Value Ref Range   ABO/RH(D) O POS    Antibody Screen NEG    Sample Expiration      09/27/2023,2359 Performed at [facility], [address], [place], [place] [address]   Comprehensive metabolic panel     Status: Abnormal   Collection Time: 09/24/23 12:07 PM  Result Value Ref Range   Sodium 138 135 - 145 mmol/L   Potassium 4.5 3.5 - 5.1 mmol/L   Chloride 106 98 - 111 mmol/L   CO2 21 (L) 22 - 32 mmol/L   Glucose, Bld 98 70 - 99 mg/dL    Comment: Glucose reference range applies only to samples taken after fasting for at least 8 hours.   BUN 23 (H) 6 - 20 mg/dL   Creatinine, Ser 8.59 (H) 0.61  - 1.24 mg/dL   Calcium  9.8 8.9 - 10.3 mg/dL   Total Protein 7.5 6.5 - 8.1 g/dL   Albumin 4.5 3.5 - 5.0 g/dL   AST 46 (H) 15 - 41 U/L   ALT 61 (H) 0 - 44 U/L   Alkaline Phosphatase 64 38 - 126 U/L   Total Bilirubin 1.0 <1.2 mg/dL   GFR, Estimated >39 >39 mL/min    Comment: (NOTE) Calculated using the CKD-EPI Creatinine Equation (2021)    Anion gap 11 5 - 15    Comment: Performed at [facility], [address] [place], [place] [address]  POC occult blood, ED Provider will collect     Status: Abnormal   Collection Time: 09/24/23  1:28 PM  Result Value Ref Range   Fecal Occult Bld POSITIVE (A) NEGATIVE  Hemoglobin and hematocrit, blood     Status: None   Collection Time: 09/25/23  8:18 AM  Result Value Ref Range   Hemoglobin 15.2 13.0 - 17.7 g/dL   Hematocrit 53.7 62.4 - 51.0 %  CBC with Differential     Status: None   Collection Time: 09/29/23 10:17 AM  Result Value Ref Range   WBC 9.1 4.0 - 10.5 K/uL   RBC 4.90 4.22 - 5.81 MIL/uL   Hemoglobin 15.0 13.0 - 17.0 g/dL   HCT 54.7 60.9 - 47.9 %   MCV 92.2 80.0 - 100.0 fL   MCH 30.6 26.0 - 34.0 pg   MCHC 33.2 30.0 - 36.0 g/dL   RDW 86.6 88.4 - 84.4 %   Platelets 266 150 - 400 K/uL   nRBC 0.0 0.0 - 0.2 %   Neutrophils Relative % 58 %   Neutro Abs 5.4 1.7 - 7.7 K/uL   Lymphocytes Relative 30 %   Lymphs Abs 2.8 0.7 - 4.0 K/uL   Monocytes Relative 9 %   Monocytes Absolute 0.8 0.1 - 1.0 K/uL   Eosinophils Relative 1 %   Eosinophils Absolute 0.1 0.0 - 0.5 K/uL   Basophils Relative 1 %   Basophils Absolute 0.1  0.0 - 0.1 K/uL   Immature Granulocytes 1 %   Abs Immature Granulocytes 0.07 0.00 - 0.07 K/uL    Comment: Performed at [facility], [address] [place], [place] [address]  Glucose, capillary     Status: None   Collection Time: 10/08/23 12:42 PM  Result Value Ref Range   Glucose-Capillary 74 70 - 99 mg/dL    Comment: Glucose reference range applies only to samples taken after fasting for at least 8 hours.  Urine  Microalbumin w/creat. ratio     Status: None   Collection Time: 10/13/23  9:35 AM  Result Value Ref Range   Creatinine, Urine 198.5 Not Estab. mg/dL   Microalbumin, Urine 65.6 Not Estab. ug/mL   Microalb/Creat Ratio 17 0 - 29 mg/g creat    Comment:                        Normal:                0 -  29                        Moderately increased: 30 - 300                        Severely increased:       >300   Hemoglobin and hematocrit, blood     Status: None   Collection Time: 10/31/23  8:07 AM  Result Value Ref Range   Hemoglobin 16.2 13.0 - 17.7 g/dL   Hematocrit 50.7 62.4 - 51.0 %  Testosterone      Status: None   Collection Time: 10/31/23  8:07 AM  Result Value Ref Range   Testosterone  318 264 - 916 ng/dL    Comment: Adult male reference interval is based on a population of healthy nonobese males (BMI <30) between 67 and 76 years old. Travison, et.al. JCEM [phone]. PMID: [id number].   Urinalysis, Routine w reflex microscopic     Status: Abnormal   Collection Time: 11/13/23  3:06 PM  Result Value Ref Range   Specific Gravity, UA 1.025 1.005 - 1.030   pH, UA 6.0 5.0 - 7.5   Color, UA Yellow Yellow   Appearance Ur Clear Clear   Leukocytes,UA Negative Negative   Protein,UA Negative Negative/Trace   Glucose, UA 3+ (A) Negative   Ketones, UA Negative Negative   RBC, UA Negative Negative   Bilirubin, UA Negative Negative   Urobilinogen, Ur 0.2 0.2 - 1.0 mg/dL   Nitrite, UA Negative Negative   Microscopic Examination Comment     Comment: Microscopic not indicated and not performed.    Studies/Results:   Assessment/Plan: Hypogonadism with fatigue.  He has some issues with fatigue and his T is only 318.  I will increase the dose to 5mg  of 1% testosterone  gel, topically qam.  Rising Hgb.  His Hgb has stablized on TRT.   Meds ordered this encounter  Medications   Testosterone  25 MG/2.5GM (1%) GEL    Sig: Place 5 g onto the skin in the morning. Apply 1 packet to  each shoulder qam    Dispense:  150 g    Refill:  5     Orders Placed This Encounter  Procedures   Urinalysis, Routine w reflex microscopic   Testosterone     Standing Status:   Future    Expected Date:   05/12/2024    Expiration Date:  11/12/2024   Hemoglobin and hematocrit, blood    Standing Status:   Future    Expected Date:   05/12/2024    Expiration Date:   11/12/2024   PSA, total and free    Standing Status:   Future    Expected Date:   05/12/2024    Expiration Date:   11/12/2024   Testosterone     Standing Status:   Future    Expected Date:   12/14/2023    Expiration Date:   02/11/2024   Hemoglobin and hematocrit, blood    Standing Status:   Future    Expected Date:   12/14/2023    Expiration Date:   02/11/2024     Return for labs in 1 month and then at f/u in 6 months. .    CC: Dr. Tobie Guys     Norleen Seltzer 11/14/2023

## 2023-11-14 ENCOUNTER — Ambulatory Visit (INDEPENDENT_AMBULATORY_CARE_PROVIDER_SITE_OTHER): Payer: 59 | Admitting: Internal Medicine

## 2023-11-14 ENCOUNTER — Ambulatory Visit: Payer: 59 | Admitting: Family Medicine

## 2023-11-14 ENCOUNTER — Encounter: Payer: Self-pay | Admitting: Internal Medicine

## 2023-11-14 VITALS — BP 126/76 | HR 71 | Ht 75.0 in | Wt 273.4 lb

## 2023-11-14 DIAGNOSIS — E1122 Type 2 diabetes mellitus with diabetic chronic kidney disease: Secondary | ICD-10-CM | POA: Diagnosis not present

## 2023-11-14 DIAGNOSIS — E782 Mixed hyperlipidemia: Secondary | ICD-10-CM

## 2023-11-14 DIAGNOSIS — K581 Irritable bowel syndrome with constipation: Secondary | ICD-10-CM | POA: Diagnosis not present

## 2023-11-14 DIAGNOSIS — N1831 Chronic kidney disease, stage 3a: Secondary | ICD-10-CM | POA: Diagnosis not present

## 2023-11-14 DIAGNOSIS — I10 Essential (primary) hypertension: Secondary | ICD-10-CM | POA: Diagnosis not present

## 2023-11-14 DIAGNOSIS — Z7984 Long term (current) use of oral hypoglycemic drugs: Secondary | ICD-10-CM

## 2023-11-14 DIAGNOSIS — J329 Chronic sinusitis, unspecified: Secondary | ICD-10-CM

## 2023-11-14 MED ORDER — FLUTICASONE PROPIONATE 50 MCG/ACT NA SUSP: 2.0000 | Freq: Every day | NASAL | 2 refills | Status: DC | PRN

## 2023-11-14 NOTE — Progress Notes (Signed)
 Established Patient Office Visit  Subjective:  Patient ID: Elijah Shaffer, male    DOB: March 09, 1981  Age: 43 y.o. MRN: [id number]  CC:  Chief Complaint  Patient presents with   Diabetes    Follow up     HPI Elijah Shaffer is a 43 y.o. male with past medical history of HTN, type 2 DM, GERD, eczema, anxiety and chronic dyspnea who presents for f/u of his chronic medical conditions.  His blood pressure is wnl today.  He takes amlodipine  5 mg QD regularly.  He has chronic palpitations and dyspnea on exertion.  Denies any chest pain currently.  He takes propranolol  for palpitations.  Has had cardiac and pulmonology workup, which have been overall benign except obesity.  Type II DM: His HbA1c was 6.7 in 10/24.  He was recently started on Jardiance  10 mg QD.  Denies any polyuria or polydipsia currently.  He reports that he likes doughnuts and needs to cut down soft drinks. He has Dexcom and reports 2 episodes of hypoglycemia (<50), but did not confirm with fingerprick. Denies over sweating or dizziness during those reported hypoglycemia spells.  IBS: Denies any abdominal pain currently.  He usually has chronic constipation and takes Amitiza  for it.  Denies any melena or hematochezia currently. He had EGD for concern for melena in 11/24, but showed no signs of active bleeding.  Anxiety: He takes Cymbalta currently.  His Prozac  was discontinued in the last visit due to excessive sweating concern, his sweating has improved now.  He still has episodes of anxiety and tremors.  He was placed on clonazepam by Dr. Milton, but was discontinued by [facility].  He has multiple physical complaints such as constant itching, dyspnea, tremors, etc., likely somewhat attributed to anxiety.  He has hydroxyzine  as needed for itching or anxiety.  He had urology evaluation for low testosterone . He is on testosterone  supplement through gel, followed by Urology.  Denies any dysuria, hematuria or urethral  discharge currently.  He has chronic fatigue, but denies any fever, chills, hemoptysis, recent weight loss or LAD.    Past Medical History:  Diagnosis Date   Acute renal injury (HCC) 06/28/2017   Anxiety    Aspiration pneumonia of right upper lobe due to gastric secretions (HCC)    Diabetes mellitus without complication (HCC)    Diverticulitis    GERD (gastroesophageal reflux disease) 01/08/2018   Hypertension    Prediabetes    Seasonal allergies    Sleep apnea    Syncope 06/26/2021    Past Surgical History:  Procedure Laterality Date   BIOPSY  03/18/2021   Procedure: BIOPSY;  Surgeon: Eartha Angelia Sieving, MD;  [facility];  Service: Gastroenterology;;  esophageal at Z-line   BIOPSY  05/23/2021   Procedure: BIOPSY;  Surgeon: Eartha Angelia Sieving, MD;  [facility];  Service: Gastroenterology;;   BIOPSY  08/19/2023   Procedure: BIOPSY;  Surgeon: Eartha Angelia Sieving, MD;  [facility];  Service: Gastroenterology;;   CARDIOPULMONARY EXERCISE TEST (CPX)  01/09/2022   Interpretation limited by submaximal effort.  Severe functional impairment when compared to max sedentary norms.  No cardiopulmonary limitations.  Noted tremor and generalized weakness that lead to premature exercise termination-consider neuromuscular evaluation.   COLONOSCOPY N/A 09/22/2017   Procedure: COLONOSCOPY;  Surgeon: Golda Claudis PENNER, MD;  [facility];  Service: Endoscopy;  Laterality: N/A;  9:55   COLONOSCOPY WITH PROPOFOL  N/A 05/23/2021   Procedure: COLONOSCOPY WITH  PROPOFOL ;  Surgeon: Eartha Flavors, Toribio, MD;  [facility];  Service: Gastroenterology;  Laterality: N/A;  7:30   ESOPHAGOGASTRODUODENOSCOPY (EGD) WITH PROPOFOL  N/A 03/18/2021   Procedure: ESOPHAGOGASTRODUODENOSCOPY (EGD) WITH PROPOFOL ;  Surgeon: Eartha Flavors Toribio, MD;  [facility];  Service: Gastroenterology;  Laterality: N/A;    ESOPHAGOGASTRODUODENOSCOPY (EGD) WITH PROPOFOL  N/A 05/23/2021   Procedure: ESOPHAGOGASTRODUODENOSCOPY (EGD) WITH PROPOFOL ;  Surgeon: Eartha Flavors Toribio, MD;  [facility];  Service: Gastroenterology;  Laterality: N/A;   ESOPHAGOGASTRODUODENOSCOPY (EGD) WITH PROPOFOL  N/A 08/19/2023   Procedure: ESOPHAGOGASTRODUODENOSCOPY (EGD) WITH PROPOFOL ;  Surgeon: Eartha Flavors Toribio, MD;  [facility];  Service: Gastroenterology;  Laterality: N/A;  11:45AM;ASA 3   ESOPHAGOGASTRODUODENOSCOPY (EGD) WITH PROPOFOL  N/A 10/08/2023   Procedure: ESOPHAGOGASTRODUODENOSCOPY (EGD) WITH PROPOFOL ;  Surgeon: Eartha Flavors Toribio, MD;  [facility];  Service: Gastroenterology;  Laterality: N/A;  1:45PM;ASA 1-2   GIVENS CAPSULE STUDY N/A 10/08/2023   Procedure: GIVENS CAPSULE STUDY;  Surgeon: Eartha Flavors Toribio, MD;  [facility];  Service: Gastroenterology;  Laterality: N/A;  1:45PM;ASA 1-2   POLYPECTOMY  09/22/2017   Procedure: POLYPECTOMY;  Surgeon: Golda Claudis PENNER, MD;  [facility];  Service: Endoscopy;;   RIGHT/LEFT HEART CATH AND CORONARY ANGIOGRAPHY N/A 12/12/2021   Procedure: RIGHT/LEFT HEART CATH AND CORONARY ANGIOGRAPHY;  Surgeon: Cherrie Toribio SAUNDERS, MD;  [facility]: [facility];  Service: Cardiovascular:: Normal Coronaries & Hemodynamics.  EF 50-55%. RA = 4 mmHg, RV = 23/4; PA = 22/6 (14) & PCW = 7; Ao sat = 95%; PA sat = 76%, 76%& SVC sat = 74%Fick cardiac output/index = 7.1/3.0; PVR = 1.0 WU   TRANSTHORACIC ECHOCARDIOGRAM  02/24/2021   EF 60 to 65%.  Normal LV function.  Normal valves.  Mild RV dilation with normal function.   ZIO PATCH EVENT MONITOR  01/2022   Sinus rhythm with heart rate range 24 to 159 bpm, 1  AVB and Wenckebach block noted along with rare PACs and PVCs.  No arrhythmias.    Family History  Problem Relation Age of Onset   Healthy Mother    Cervical cancer Mother    Cancer Father    Colon cancer Maternal  Grandmother    Colon cancer Maternal Grandfather     Social History   Socioeconomic History   Marital status: Single    Spouse name: Not on file   Number of children: Not on file   Years of education: Not on file   Highest education level: Not on file  Occupational History   Not on file  Tobacco Use   Smoking status: Never    Passive exposure: Past   Smokeless tobacco: Never  Vaping Use   Vaping status: Never Used  Substance and Sexual Activity   Alcohol use: Not Currently    Comment: occasionally   Drug use: No   Sexual activity: Not on file  Other Topics Concern   Not on file  Social History Narrative   He currently has his own lawn care landscaping service. He previous had 80 yards before he got sick and since decreased. He works around a interior and spatial designer. One of the chemical names is roundup.    Social Drivers of Corporate Investment Banker Strain: Not on file  Food Insecurity: No Food Insecurity (06/20/2023)   Hunger Vital Sign    Worried About Running Out of Food in the Last Year: Never true    Ran Out of  Food in the Last Year: Never true  Transportation Needs: No Transportation Needs (12/27/2022)   PRAPARE - Administrator, Civil Service (Medical): No    Lack of Transportation (Non-Medical): No  Physical Activity: Insufficiently Active (03/29/2022)   Exercise Vital Sign    Days of Exercise per Week: 2 days    Minutes of Exercise per Session: 10 min  Stress: Stress Concern Present (05/07/2022)   [employer] of Occupational Health - Occupational Stress Questionnaire    Feeling of Stress : To some extent  Social Connections: Not on file  Intimate Partner Violence: Not At Risk (06/20/2023)   Humiliation, Afraid, Rape, and Kick questionnaire    Fear of Current or Ex-Partner: No    Emotionally Abused: No    Physically Abused: No    Sexually Abused: No    Outpatient Medications Prior to Visit  Medication Sig Dispense Refill   amLODipine  (NORVASC ) 5  MG tablet Take 1 tablet by mouth once daily 90 tablet 0   b complex vitamins capsule Take 1 capsule by mouth daily. With D3 gummie     Blood Glucose Monitoring Suppl DEVI 1 each by Does not apply route in the morning, at noon, and at bedtime. May substitute to any manufacturer covered by patient's insurance. 1 each 0   cetirizine (ZYRTEC) 10 MG tablet Take 10 mg by mouth daily.     Cholecalciferol (VITAMIN D -3 PO) Take by mouth daily at 6 (six) AM.     clindamycin  (CLEOCIN  T) 1 % external solution Apply topically to acne bumps at aa's qd 30 mL 3   clobetasol  (TEMOVATE ) 0.05 % external solution Apply 1 application. topically 2 (two) times daily. Use as directed.  Mix with cerave cream. Use for up to 4 weeks. Avoid applying to face, groin, and axilla. Use as directed. 50 mL 1   Continuous Glucose Receiver (DEXCOM G6 RECEIVER) DEVI Use to check blood sugar daily DX e11.65 1 each 0   Continuous Glucose Sensor (DEXCOM G6 SENSOR) MISC Use to check blood sugar daily DX e11.65 3 each 5   Continuous Glucose Transmitter (DEXCOM G6 TRANSMITTER) MISC Use to check blood sugar daily DX e11.65 1 each 3   cyclobenzaprine  (FLEXERIL ) 5 MG tablet Take 1 tablet (5 mg total) by mouth at bedtime as needed for muscle spasms. 30 tablet 1   diphenhydrAMINE -zinc  acetate (BENADRYL  EXTRA STRENGTH) cream Apply 1 application topically 3 (three) times daily as needed for itching. 28.4 g 0   docusate sodium  (COLACE) 100 MG capsule Take 100 mg by mouth 2 (two) times daily.     empagliflozin  (JARDIANCE ) 10 MG TABS tablet Take 1 tablet (10 mg total) by mouth daily before breakfast. 60 tablet 1   fenofibrate  (TRICOR ) 145 MG tablet Take 1 tablet (145 mg total) by mouth daily. 90 tablet 3   fluocinonide  (LIDEX ) 0.05 % external solution Apply once or twice daily to affected areas on scalp up to 2 weeks as needed for itching. Avoid applying to face, groin, and axilla. 60 mL 2   gabapentin (NEURONTIN) 300 MG capsule Take 300 mg by mouth 3  (three) times daily.     Glucose Blood (BLOOD GLUCOSE TEST STRIPS) STRP 1 each by In Vitro route in the morning, at noon, and at bedtime. May substitute to any manufacturer covered by patient's insurance. 100 strip 1   hydrOXYzine  (VISTARIL ) 25 MG capsule TAKE 1 or 2 CAPSULES BY MOUTH BEFORE BED AS NEEDED FOR ITCHING. CAN CAUSE DROWSINESS. 60  capsule 2   ketoconazole  (NIZORAL ) 2 % shampoo Apply 1 Application topically daily as needed for irritation. 120 mL 11   lubiprostone  (AMITIZA ) 24 MCG capsule Take 1 capsule (24 mcg total) by mouth 2 (two) times daily with a meal. 60 capsule 1   montelukast  (SINGULAIR ) 10 MG tablet TAKE 1 TABLET BY MOUTH AT BEDTIME 90 tablet 3   omega-3 acid ethyl esters (LOVAZA ) 1 g capsule Take 2 capsules (2 g total) by mouth 2 (two) times daily. 120 capsule 3   omeprazole  (PRILOSEC) 40 MG capsule Take 1 capsule by mouth twice daily 180 capsule 0   ondansetron  (ZOFRAN ) 4 MG tablet Take 1 tablet (4 mg total) by mouth every 6 (six) hours as needed for nausea. 20 tablet 0   propranolol  (INDERAL ) 20 MG tablet Take one tablet 20 mg  twice daily  may take an  additional dose of 20 mg  as needed for palpitations 190 tablet 3   rosuvastatin  (CRESTOR ) 40 MG tablet Take 1 tablet (40 mg total) by mouth daily. 90 tablet 3   Testosterone  25 MG/2.5GM (1%) GEL Place 5 g onto the skin in the morning. Apply 1 packet to each shoulder qam 150 g 5   triamcinolone  cream (KENALOG ) 0.1 %      Vitamin D , Ergocalciferol , (DRISDOL ) 1.25 MG (50000 UNIT) CAPS capsule Take 1 capsule (50,000 Units total) by mouth every 7 (seven) days. 20 capsule 1   fluticasone  (FLONASE  ALLERGY RELIEF) 50 MCG/ACT nasal spray Place 2 sprays into both nostrils daily as needed for allergies or rhinitis. (Patient taking differently: Place 2 sprays into both nostrils daily.) 9.9 mL 2   No facility-administered medications prior to visit.    Allergies  Allergen Reactions   Gramineae Pollens Itching   Claritin [Loratadine]  Other (See Comments)    Heart race   Loratadine-Pseudoephedrine Er Palpitations   Pollen Extract Other (See Comments)    Seasonal allergies    ROS Review of Systems  Constitutional:  Positive for fatigue. Negative for chills and fever.  HENT:  Positive for congestion. Negative for sore throat.   Eyes:  Negative for pain and discharge.  Respiratory:  Positive for shortness of breath. Negative for cough.   Cardiovascular:  Positive for palpitations. Negative for chest pain.  Gastrointestinal:  Positive for constipation. Negative for diarrhea, nausea and vomiting.  Endocrine: Negative for polydipsia and polyuria.  Genitourinary:  Negative for dysuria and hematuria.  Musculoskeletal:  Positive for arthralgias, back pain, myalgias and neck pain. Negative for neck stiffness.  Neurological:  Positive for dizziness and tremors. Negative for headaches.  Psychiatric/Behavioral:  Positive for sleep disturbance. Negative for agitation and behavioral problems. The patient is nervous/anxious.       Objective:    Physical Exam Vitals reviewed.  Constitutional:      General: He is not in acute distress.    Appearance: He is not diaphoretic.  HENT:     Head: Normocephalic and atraumatic.     Nose: Congestion present.     Mouth/Throat:     Mouth: Mucous membranes are moist.  Eyes:     General: No scleral icterus.    Extraocular Movements: Extraocular movements intact.  Cardiovascular:     Rate and Rhythm: Normal rate and regular rhythm.     Pulses: Normal pulses.     Heart sounds: Normal heart sounds. No murmur heard. Pulmonary:     Breath sounds: Normal breath sounds. No wheezing or rales.  Abdominal:  General: Bowel sounds are normal.     Palpations: Abdomen is soft.     Tenderness: There is no abdominal tenderness.  Musculoskeletal:        General: Tenderness (Mid thoracic and lumbar spine area) present.     Cervical back: Neck supple. No tenderness.     Right lower leg: No  edema.     Left lower leg: No edema.  Skin:    General: Skin is warm.     Coloration: Skin is not jaundiced.  Neurological:     General: No focal deficit present.     Mental Status: He is alert and oriented to person, place, and time.     Sensory: No sensory deficit.     Motor: No weakness.     Comments: Functional tremors on right hand  Psychiatric:        Mood and Affect: Mood is anxious.        Behavior: Behavior normal.        Thought Content: Thought content does not include homicidal or suicidal ideation.     BP 126/76 (BP Location: Left Arm, Patient Position: Sitting, Cuff Size: Large)   Pulse 71   Ht 6' 3 (1.905 m)   Wt 273 lb 6.4 oz (124 kg)   SpO2 94%   BMI 34.17 kg/m  Wt Readings from Last 3 Encounters:  11/14/23 273 lb 6.4 oz (124 kg)  10/28/23 279 lb (126.6 kg)  10/13/23 272 lb 9.6 oz (123.7 kg)    Lab Results  Component Value Date   TSH 3.110 12/02/2022   Lab Results  Component Value Date   WBC 9.1 09/29/2023   HGB 16.2 10/31/2023   HCT 49.2 10/31/2023   MCV 92.2 09/29/2023   PLT 266 09/29/2023   Lab Results  Component Value Date   NA 138 09/24/2023   K 4.5 09/24/2023   CO2 21 (L) 09/24/2023   GLUCOSE 98 09/24/2023   BUN 23 (H) 09/24/2023   CREATININE 1.40 (H) 09/24/2023   BILITOT 1.0 09/24/2023   ALKPHOS 64 09/24/2023   AST 46 (H) 09/24/2023   ALT 61 (H) 09/24/2023   PROT 7.5 09/24/2023   ALBUMIN 4.5 09/24/2023   CALCIUM  9.8 09/24/2023   ANIONGAP 11 09/24/2023   EGFR 48 (L) 08/06/2023   GFR 68.05 10/02/2021   Lab Results  Component Value Date   CHOL 128 05/22/2023   Lab Results  Component Value Date   HDL 32 (L) 05/22/2023   Lab Results  Component Value Date   LDLCALC 52 05/22/2023   Lab Results  Component Value Date   TRIG 279 (H) 05/22/2023   Lab Results  Component Value Date   CHOLHDL 4.0 05/22/2023   Lab Results  Component Value Date   HGBA1C 6.7 08/25/2023      Assessment & Plan:   Problem List Items  Addressed This Visit       Cardiovascular and Mediastinum   Essential hypertension   BP Readings from Last 1 Encounters:  11/14/23 126/76   Well-controlled with Amlodipine  5 mg QD On propranolol  for PSVT and tremors Counseled for compliance with the medications Advised DASH diet and moderate exercise/walking, at least 150 mins/week        Respiratory   Chronic sinusitis   Uses Flonase  Continue Singulair  and Zyrtec for allergies      Relevant Medications   fluticasone  (FLONASE  ALLERGY RELIEF) 50 MCG/ACT nasal spray     Digestive   Irritable bowel syndrome with constipation  Usually has chronic constipation Has tried Dulcolax without much relief Advised to take MiraLAX  as needed On Amitiza , advised to hold if he has diarrhea Follow-up with GI        Endocrine   Type 2 diabetes mellitus with diabetic chronic kidney disease (HCC) - Primary   Lab Results  Component Value Date   HGBA1C 6.7 08/25/2023   New onset On Jardiance  10 mg QD Advised to follow diabetic diet F/u CMP, HbA1c and lipid panel Recent episodes of hypoglycemia -although questionable considering he did not experience typical signs of hypoglycemia, advised to check blood glucose through fingerstick during CGM reported hypoglycemic episodes Diabetic eye exam: Advised to follow up with Ophthalmology for diabetic eye exam  Has CKD stage 3a, followed by Nephrology, needs to improve hydration      Relevant Orders   CMP14+EGFR   Hemoglobin A1c     Genitourinary   Chronic kidney disease, stage 3a (HCC)   Last BMP showed improvement in GFR Maintain adequate hydration On Jardiance  Check CMP      Relevant Orders   CMP14+EGFR     Other   Mixed hyperlipidemia (Chronic)   On Crestor  and Fenofibrate  Check lipid profile      Relevant Orders   Lipid Profile    Meds ordered this encounter  Medications   fluticasone  (FLONASE  ALLERGY RELIEF) 50 MCG/ACT nasal spray    Sig: Place 2 sprays into both  nostrils daily as needed for allergies or rhinitis.    Dispense:  16 mL    Refill:  2    Follow-up: Return in about 3 months (around 02/12/2024) for Annual physical.    Suzzane MARLA Blanch, MD

## 2023-11-14 NOTE — Assessment & Plan Note (Addendum)
 Lab Results  Component Value Date   HGBA1C 6.7 08/25/2023   New onset On Jardiance  10 mg QD Advised to follow diabetic diet F/u CMP, HbA1c and lipid panel Recent episodes of hypoglycemia -although questionable considering he did not experience typical signs of hypoglycemia, advised to check blood glucose through fingerstick during CGM reported hypoglycemic episodes Diabetic eye exam: Advised to follow up with Ophthalmology for diabetic eye exam  Has CKD stage 3a, followed by Nephrology, needs to improve hydration

## 2023-11-14 NOTE — Assessment & Plan Note (Signed)
 Last BMP showed improvement in GFR Maintain adequate hydration On Jardiance Check CMP

## 2023-11-14 NOTE — Assessment & Plan Note (Signed)
On Crestor and Fenofibrate Check lipid profile

## 2023-11-14 NOTE — Assessment & Plan Note (Signed)
 Usually has chronic constipation Has tried Dulcolax without much relief Advised to take MiraLAX as needed On Amitiza, advised to hold if he has diarrhea Follow-up with GI

## 2023-11-14 NOTE — Assessment & Plan Note (Signed)
 Uses Flonase Continue Singulair and Zyrtec for allergies

## 2023-11-14 NOTE — Assessment & Plan Note (Signed)
 BP Readings from Last 1 Encounters:  11/14/23 126/76   Well-controlled with Amlodipine 5 mg QD On propranolol for PSVT and tremors Counseled for compliance with the medications Advised DASH diet and moderate exercise/walking, at least 150 mins/week

## 2023-11-14 NOTE — Patient Instructions (Signed)
 Please continue to take medications as prescribed.  Please continue to follow low carb diet and perform moderate exercise/walking at least 150 mins/week.  Please get fasting blood tests after 10 days (after 12/04/23).

## 2023-11-20 ENCOUNTER — Encounter: Payer: Self-pay | Admitting: Internal Medicine

## 2023-11-22 NOTE — Telephone Encounter (Signed)
 Please see mychart messages sent and advise.

## 2023-11-27 ENCOUNTER — Telehealth: Payer: Self-pay | Admitting: Pulmonary Disease

## 2023-11-27 DIAGNOSIS — G4733 Obstructive sleep apnea (adult) (pediatric): Secondary | ICD-10-CM

## 2023-11-27 DIAGNOSIS — E119 Type 2 diabetes mellitus without complications: Secondary | ICD-10-CM | POA: Diagnosis not present

## 2023-11-27 DIAGNOSIS — K449 Diaphragmatic hernia without obstruction or gangrene: Secondary | ICD-10-CM | POA: Diagnosis not present

## 2023-11-27 NOTE — Telephone Encounter (Signed)
Patient is having a hard time using his sleep mask for his cpap machine. At night time he tends to chew on it causing it to have holes. It also suffocating when he wears it. He wants to know if there is an alternative type of mask he can use that will be more comfortable. Please call and advise [phone]

## 2023-11-28 ENCOUNTER — Other Ambulatory Visit: Payer: Self-pay | Admitting: General Surgery

## 2023-12-02 ENCOUNTER — Other Ambulatory Visit: Payer: Self-pay | Admitting: Dermatology

## 2023-12-02 DIAGNOSIS — L2089 Other atopic dermatitis: Secondary | ICD-10-CM

## 2023-12-03 ENCOUNTER — Other Ambulatory Visit: Payer: Self-pay | Admitting: Cardiology

## 2023-12-03 ENCOUNTER — Other Ambulatory Visit: Payer: Self-pay | Admitting: Internal Medicine

## 2023-12-03 DIAGNOSIS — I10 Essential (primary) hypertension: Secondary | ICD-10-CM

## 2023-12-09 NOTE — Telephone Encounter (Signed)
Please order a face mask fitting session so he can chose a better fitting mask.  Thanks, JD

## 2023-12-09 NOTE — Telephone Encounter (Signed)
LMTCB pt was supposed to have f/u with [facility] in Jan along with a PFT.  Will also send message to provider about ordering new cpap mask.

## 2023-12-10 DIAGNOSIS — D631 Anemia in chronic kidney disease: Secondary | ICD-10-CM | POA: Diagnosis not present

## 2023-12-10 DIAGNOSIS — R809 Proteinuria, unspecified: Secondary | ICD-10-CM | POA: Diagnosis not present

## 2023-12-10 DIAGNOSIS — N189 Chronic kidney disease, unspecified: Secondary | ICD-10-CM | POA: Diagnosis not present

## 2023-12-10 DIAGNOSIS — D649 Anemia, unspecified: Secondary | ICD-10-CM | POA: Diagnosis not present

## 2023-12-10 NOTE — Telephone Encounter (Signed)
I have sent order for mask fitting with DME  I called the pt and left him a detailed msg ok per DPR letting him know this was done and also that he needs to call for appt with PFT

## 2023-12-11 DIAGNOSIS — M47814 Spondylosis without myelopathy or radiculopathy, thoracic region: Secondary | ICD-10-CM | POA: Diagnosis not present

## 2023-12-11 DIAGNOSIS — S134XXA Sprain of ligaments of cervical spine, initial encounter: Secondary | ICD-10-CM | POA: Diagnosis not present

## 2023-12-11 DIAGNOSIS — M546 Pain in thoracic spine: Secondary | ICD-10-CM | POA: Diagnosis not present

## 2023-12-11 DIAGNOSIS — M4004 Postural kyphosis, thoracic region: Secondary | ICD-10-CM | POA: Diagnosis not present

## 2023-12-11 DIAGNOSIS — S338XXA Sprain of other parts of lumbar spine and pelvis, initial encounter: Secondary | ICD-10-CM | POA: Diagnosis not present

## 2023-12-13 ENCOUNTER — Other Ambulatory Visit: Payer: Self-pay | Admitting: Cardiology

## 2023-12-13 ENCOUNTER — Other Ambulatory Visit (INDEPENDENT_AMBULATORY_CARE_PROVIDER_SITE_OTHER): Payer: Self-pay | Admitting: Gastroenterology

## 2023-12-13 DIAGNOSIS — K219 Gastro-esophageal reflux disease without esophagitis: Secondary | ICD-10-CM

## 2023-12-15 ENCOUNTER — Other Ambulatory Visit: Payer: 59

## 2023-12-15 DIAGNOSIS — E291 Testicular hypofunction: Secondary | ICD-10-CM

## 2023-12-16 ENCOUNTER — Ambulatory Visit: Payer: 59 | Admitting: Dermatology

## 2023-12-16 LAB — TESTOSTERONE: Testosterone: 470 ng/dL (ref 264–916)

## 2023-12-16 LAB — HEMOGLOBIN AND HEMATOCRIT, BLOOD
Hematocrit: 50.7 % (ref 37.5–51.0)
Hemoglobin: 16.5 g/dL (ref 13.0–17.7)

## 2023-12-18 ENCOUNTER — Other Ambulatory Visit (INDEPENDENT_AMBULATORY_CARE_PROVIDER_SITE_OTHER): Payer: Self-pay | Admitting: Gastroenterology

## 2023-12-18 DIAGNOSIS — S338XXA Sprain of other parts of lumbar spine and pelvis, initial encounter: Secondary | ICD-10-CM | POA: Diagnosis not present

## 2023-12-18 DIAGNOSIS — M47814 Spondylosis without myelopathy or radiculopathy, thoracic region: Secondary | ICD-10-CM | POA: Diagnosis not present

## 2023-12-18 DIAGNOSIS — S134XXA Sprain of ligaments of cervical spine, initial encounter: Secondary | ICD-10-CM | POA: Diagnosis not present

## 2023-12-18 DIAGNOSIS — M546 Pain in thoracic spine: Secondary | ICD-10-CM | POA: Diagnosis not present

## 2023-12-18 DIAGNOSIS — M4004 Postural kyphosis, thoracic region: Secondary | ICD-10-CM | POA: Diagnosis not present

## 2023-12-18 NOTE — Telephone Encounter (Signed)
 Last seen 10/28/23

## 2023-12-22 ENCOUNTER — Encounter: Payer: Self-pay | Admitting: Internal Medicine

## 2023-12-22 ENCOUNTER — Other Ambulatory Visit: Payer: Self-pay

## 2023-12-23 DIAGNOSIS — M546 Pain in thoracic spine: Secondary | ICD-10-CM | POA: Diagnosis not present

## 2023-12-23 DIAGNOSIS — M4004 Postural kyphosis, thoracic region: Secondary | ICD-10-CM | POA: Diagnosis not present

## 2023-12-23 DIAGNOSIS — M47814 Spondylosis without myelopathy or radiculopathy, thoracic region: Secondary | ICD-10-CM | POA: Diagnosis not present

## 2023-12-23 DIAGNOSIS — S134XXA Sprain of ligaments of cervical spine, initial encounter: Secondary | ICD-10-CM | POA: Diagnosis not present

## 2023-12-23 DIAGNOSIS — S338XXA Sprain of other parts of lumbar spine and pelvis, initial encounter: Secondary | ICD-10-CM | POA: Diagnosis not present

## 2023-12-24 DIAGNOSIS — G629 Polyneuropathy, unspecified: Secondary | ICD-10-CM | POA: Diagnosis not present

## 2023-12-27 ENCOUNTER — Other Ambulatory Visit (INDEPENDENT_AMBULATORY_CARE_PROVIDER_SITE_OTHER): Payer: Self-pay | Admitting: Gastroenterology

## 2024-01-04 ENCOUNTER — Other Ambulatory Visit: Payer: Self-pay | Admitting: Dermatology

## 2024-01-04 DIAGNOSIS — L2089 Other atopic dermatitis: Secondary | ICD-10-CM

## 2024-01-06 DIAGNOSIS — M546 Pain in thoracic spine: Secondary | ICD-10-CM | POA: Diagnosis not present

## 2024-01-06 DIAGNOSIS — S134XXA Sprain of ligaments of cervical spine, initial encounter: Secondary | ICD-10-CM | POA: Diagnosis not present

## 2024-01-06 DIAGNOSIS — M4004 Postural kyphosis, thoracic region: Secondary | ICD-10-CM | POA: Diagnosis not present

## 2024-01-06 DIAGNOSIS — M47814 Spondylosis without myelopathy or radiculopathy, thoracic region: Secondary | ICD-10-CM | POA: Diagnosis not present

## 2024-01-06 DIAGNOSIS — S338XXA Sprain of other parts of lumbar spine and pelvis, initial encounter: Secondary | ICD-10-CM | POA: Diagnosis not present

## 2024-01-12 DIAGNOSIS — N1832 Chronic kidney disease, stage 3b: Secondary | ICD-10-CM | POA: Diagnosis not present

## 2024-01-15 ENCOUNTER — Other Ambulatory Visit: Payer: Self-pay | Admitting: Cardiology

## 2024-01-15 ENCOUNTER — Ambulatory Visit (INDEPENDENT_AMBULATORY_CARE_PROVIDER_SITE_OTHER): Payer: 59 | Admitting: Internal Medicine

## 2024-01-15 ENCOUNTER — Encounter: Payer: Self-pay | Admitting: Internal Medicine

## 2024-01-15 VITALS — BP 124/78 | HR 71 | Ht 75.0 in | Wt 272.4 lb

## 2024-01-15 DIAGNOSIS — R809 Proteinuria, unspecified: Secondary | ICD-10-CM | POA: Diagnosis not present

## 2024-01-15 DIAGNOSIS — I10 Essential (primary) hypertension: Secondary | ICD-10-CM | POA: Diagnosis not present

## 2024-01-15 DIAGNOSIS — Z7984 Long term (current) use of oral hypoglycemic drugs: Secondary | ICD-10-CM | POA: Diagnosis not present

## 2024-01-15 DIAGNOSIS — E782 Mixed hyperlipidemia: Secondary | ICD-10-CM

## 2024-01-15 DIAGNOSIS — K449 Diaphragmatic hernia without obstruction or gangrene: Secondary | ICD-10-CM

## 2024-01-15 DIAGNOSIS — E291 Testicular hypofunction: Secondary | ICD-10-CM

## 2024-01-15 DIAGNOSIS — F339 Major depressive disorder, recurrent, unspecified: Secondary | ICD-10-CM

## 2024-01-15 DIAGNOSIS — F419 Anxiety disorder, unspecified: Secondary | ICD-10-CM

## 2024-01-15 DIAGNOSIS — Z0001 Encounter for general adult medical examination with abnormal findings: Secondary | ICD-10-CM

## 2024-01-15 DIAGNOSIS — N1831 Chronic kidney disease, stage 3a: Secondary | ICD-10-CM | POA: Diagnosis not present

## 2024-01-15 DIAGNOSIS — E1122 Type 2 diabetes mellitus with diabetic chronic kidney disease: Secondary | ICD-10-CM | POA: Diagnosis not present

## 2024-01-15 DIAGNOSIS — E1129 Type 2 diabetes mellitus with other diabetic kidney complication: Secondary | ICD-10-CM | POA: Diagnosis not present

## 2024-01-15 MED ORDER — ROSUVASTATIN CALCIUM 40 MG PO TABS: 40.0000 mg | ORAL_TABLET | Freq: Every day | ORAL | 0 refills | Status: DC

## 2024-01-15 NOTE — Progress Notes (Signed)
 Established Patient Office Visit  Subjective:  Patient ID: Elijah Shaffer, male    DOB: [birth year]  Age: 43 y.o. MRN: [id number]  CC:  Chief Complaint  Patient presents with   Annual Exam    HPI Elijah Shaffer is a 43 y.o. male with past medical history of HTN, type 2 DM, GERD, eczema, anxiety and chronic dyspnea who presents for annual physical.  His blood pressure is wnl today.  He takes amlodipine 5 mg QD regularly.  He has chronic palpitations and dyspnea on exertion.  Denies any chest pain currently.  He takes propranolol for palpitations.  Has had cardiac and pulmonology workup, which have been overall benign.   Type II DM: His HbA1c was 6.7 in 10/24.  He takes Jardiance 10 mg QD.  Denies any polyuria or polydipsia currently.  He reports that he likes doughnuts, but has cut down them and soft drinks. He has Dexcom, 90-day average - 145. He had 2 episodes of hypoglycemia (<50) before the previous visit, but did not confirm with fingerprick. Denies over sweating or dizziness during those reported hypoglycemia spells.   IBS: Denies any abdominal pain currently.  He usually has chronic constipation and takes Amitiza for it.  Denies any melena or hematochezia currently. He had EGD for concern for melena in 11/24, but showed no signs of active bleeding.   Anxiety: He takes Cymbalta currently.  His Prozac was discontinued due to excessive sweating concern, his sweating has improved now.  He still has episodes of anxiety and tremors.  He was placed on clonazepam by Dr. Gerilyn Pilgrim, but was discontinued by [facility].  He has multiple physical complaints such as constant itching, dyspnea, tremors, etc., likely somewhat attributed to anxiety.  He has hydroxyzine as needed for itching or anxiety.   He had urology evaluation for low testosterone. He is on testosterone supplement through gel, followed by Urology.  Denies any dysuria, hematuria or urethral discharge currently.  He has chronic  fatigue, but denies any fever, chills, hemoptysis, recent weight loss or LAD.    Past Medical History:  Diagnosis Date   Acute renal injury (HCC) 06/28/2017   Anxiety    Aspiration pneumonia of right upper lobe due to gastric secretions (HCC)    Diabetes mellitus without complication (HCC)    Diverticulitis    GERD (gastroesophageal reflux disease) 01/08/2018   Hypertension    Prediabetes    Seasonal allergies    Sleep apnea    Syncope 06/26/2021    Past Surgical History:  Procedure Laterality Date   BIOPSY  03/18/2021   Procedure: BIOPSY;  Surgeon: Dolores Frame, MD;  [facility];  Service: Gastroenterology;;  esophageal at Z-line   BIOPSY  05/23/2021   Procedure: BIOPSY;  Surgeon: Dolores Frame, MD;  [facility];  Service: Gastroenterology;;   BIOPSY  08/19/2023   Procedure: BIOPSY;  Surgeon: Dolores Frame, MD;  [facility];  Service: Gastroenterology;;   CARDIOPULMONARY EXERCISE TEST (CPX)  01/09/2022   Interpretation limited by submaximal effort.  Severe functional impairment when compared to max sedentary norms.  No cardiopulmonary limitations.  Noted tremor and generalized weakness that lead to premature exercise termination-consider neuromuscular evaluation.   COLONOSCOPY N/A 09/22/2017   Procedure: COLONOSCOPY;  Surgeon: Malissa Hippo, MD;  [facility];  Service: Endoscopy;  Laterality: N/A;  9:55   COLONOSCOPY WITH PROPOFOL N/A 05/23/2021   Procedure: COLONOSCOPY WITH PROPOFOL;  Surgeon: Marguerita Merles,  Reuel Boom, MD;  [facility];  Service: Gastroenterology;  Laterality: N/A;  7:30   ESOPHAGOGASTRODUODENOSCOPY (EGD) WITH PROPOFOL N/A 03/18/2021   Procedure: ESOPHAGOGASTRODUODENOSCOPY (EGD) WITH PROPOFOL;  Surgeon: Dolores Frame, MD;  [facility];  Service: Gastroenterology;  Laterality: N/A;   ESOPHAGOGASTRODUODENOSCOPY (EGD) WITH PROPOFOL N/A  05/23/2021   Procedure: ESOPHAGOGASTRODUODENOSCOPY (EGD) WITH PROPOFOL;  Surgeon: Dolores Frame, MD;  [facility];  Service: Gastroenterology;  Laterality: N/A;   ESOPHAGOGASTRODUODENOSCOPY (EGD) WITH PROPOFOL N/A 08/19/2023   Procedure: ESOPHAGOGASTRODUODENOSCOPY (EGD) WITH PROPOFOL;  Surgeon: Dolores Frame, MD;  [facility];  Service: Gastroenterology;  Laterality: N/A;  11:45AM;ASA 3   ESOPHAGOGASTRODUODENOSCOPY (EGD) WITH PROPOFOL N/A 10/08/2023   Procedure: ESOPHAGOGASTRODUODENOSCOPY (EGD) WITH PROPOFOL;  Surgeon: Dolores Frame, MD;  [facility];  Service: Gastroenterology;  Laterality: N/A;  1:45PM;ASA 1-2   GIVENS CAPSULE STUDY N/A 10/08/2023   Procedure: GIVENS CAPSULE STUDY;  Surgeon: Dolores Frame, MD;  [facility];  Service: Gastroenterology;  Laterality: N/A;  1:45PM;ASA 1-2   POLYPECTOMY  09/22/2017   Procedure: POLYPECTOMY;  Surgeon: Malissa Hippo, MD;  [facility];  Service: Endoscopy;;   RIGHT/LEFT HEART CATH AND CORONARY ANGIOGRAPHY N/A 12/12/2021   Procedure: RIGHT/LEFT HEART CATH AND CORONARY ANGIOGRAPHY;  Surgeon: Dolores Patty, MD;  [facility];  Service: Cardiovascular:: Normal Coronaries & Hemodynamics.  EF 50-55%. RA = 4 mmHg, RV = 23/4; PA = 22/6 (14) & PCW = 7; Ao sat = 95%; PA sat = 76%, 76%& SVC sat = 74%Fick cardiac output/index = 7.1/3.0; PVR = 1.0 WU   TRANSTHORACIC ECHOCARDIOGRAM  02/24/2021   EF 60 to 65%.  Normal LV function.  Normal valves.  Mild RV dilation with normal function.   ZIO PATCH EVENT MONITOR  01/2022   Sinus rhythm with heart rate range 24 to 159 bpm, 1  AVB and Wenckebach block noted along with rare PACs and PVCs.  No arrhythmias.    Family History  Problem Relation Age of Onset   Healthy Mother    Cervical cancer Mother    Cancer Father    Colon cancer Maternal Grandmother    Colon cancer Maternal Grandfather      Social History   Socioeconomic History   Marital status: Single    Spouse name: Not on file   Number of children: Not on file   Years of education: Not on file   Highest education level: Not on file  Occupational History   Not on file  Tobacco Use   Smoking status: Never    Passive exposure: Past   Smokeless tobacco: Never  Vaping Use   Vaping status: Never Used  Substance and Sexual Activity   Alcohol use: Not Currently    Comment: occasionally   Drug use: No   Sexual activity: Not on file  Other Topics Concern   Not on file  Social History Narrative   He currently has his own lawn care landscaping service. He previous had 80 yards before he got sick and since decreased. He works around a Interior and spatial designer. One of the chemical names is roundup.    Social Drivers of Corporate investment banker Strain: Not on file  Food Insecurity: No Food Insecurity (06/20/2023)   Hunger Vital Sign    Worried About Running Out of Food in the Last Year: Never true    Ran Out of Food in the Last Year:  Never true  Transportation Needs: No Transportation Needs (12/27/2022)   PRAPARE - Administrator, Civil Service (Medical): No    Lack of Transportation (Non-Medical): No  Physical Activity: Insufficiently Active (03/29/2022)   Exercise Vital Sign    Days of Exercise per Week: 2 days    Minutes of Exercise per Session: 10 min  Stress: Stress Concern Present (05/07/2022)   [employer] of Occupational Health - Occupational Stress Questionnaire    Feeling of Stress : To some extent  Social Connections: Not on file  Intimate Partner Violence: Not At Risk (06/20/2023)   Humiliation, Afraid, Rape, and Kick questionnaire    Fear of Current or Ex-Partner: No    Emotionally Abused: No    Physically Abused: No    Sexually Abused: No    Outpatient Medications Prior to Visit  Medication Sig Dispense Refill   DULoxetine (CYMBALTA) 30 MG capsule Take 30 mg by mouth 2 (two) times  daily.     amLODipine (NORVASC) 5 MG tablet Take 1 tablet by mouth once daily 90 tablet 0   b complex vitamins capsule Take 1 capsule by mouth daily. With D3 gummie     Blood Glucose Monitoring Suppl DEVI 1 each by Does not apply route in the morning, at noon, and at bedtime. May substitute to any manufacturer covered by patient's insurance. 1 each 0   cetirizine (ZYRTEC) 10 MG tablet Take 10 mg by mouth daily.     Cholecalciferol (VITAMIN D-3 PO) Take by mouth daily at 6 (six) AM.     clindamycin (CLEOCIN T) 1 % external solution Apply topically to acne bumps at aa's qd 30 mL 3   clobetasol (TEMOVATE) 0.05 % external solution Apply 1 application. topically 2 (two) times daily. Use as directed.  Mix with cerave cream. Use for up to 4 weeks. Avoid applying to face, groin, and axilla. Use as directed. 50 mL 1   Continuous Glucose Receiver (DEXCOM G6 RECEIVER) DEVI Use to check blood sugar daily DX e11.65 1 each 0   Continuous Glucose Sensor (DEXCOM G6 SENSOR) MISC Use to check blood sugar daily DX e11.65 3 each 5   Continuous Glucose Transmitter (DEXCOM G6 TRANSMITTER) MISC Use to check blood sugar daily DX e11.65 1 each 3   cyclobenzaprine (FLEXERIL) 5 MG tablet Take 1 tablet (5 mg total) by mouth at bedtime as needed for muscle spasms. 30 tablet 1   diphenhydrAMINE-zinc acetate (BENADRYL EXTRA STRENGTH) cream Apply 1 application topically 3 (three) times daily as needed for itching. 28.4 g 0   docusate sodium (COLACE) 100 MG capsule Take 100 mg by mouth 2 (two) times daily.     empagliflozin (JARDIANCE) 10 MG TABS tablet Take 1 tablet (10 mg total) by mouth daily before breakfast. 60 tablet 1   fenofibrate (TRICOR) 145 MG tablet Take 1 tablet by mouth once daily 30 tablet 0   fluocinonide (LIDEX) 0.05 % external solution Apply once or twice daily to affected areas on scalp up to 2 weeks as needed for itching. Avoid applying to face, groin, and axilla. 60 mL 2   fluticasone (FLONASE ALLERGY RELIEF)  50 MCG/ACT nasal spray Place 2 sprays into both nostrils daily as needed for allergies or rhinitis. 16 mL 2   gabapentin (NEURONTIN) 300 MG capsule Take 300 mg by mouth 3 (three) times daily.     Glucose Blood (BLOOD GLUCOSE TEST STRIPS) STRP 1 each by In Vitro route in the morning, at noon, and at  bedtime. May substitute to any manufacturer covered by patient's insurance. 100 strip 1   hydrOXYzine (VISTARIL) 25 MG capsule TAKE 1 TO 2 CAPSULES BY MOUTH EVERY DAY BEFORE BEDTIME AS NEEDED FOR  ITCHING.  CAN  CAUSE  DROWSINESS 60 capsule 0   ketoconazole (NIZORAL) 2 % shampoo Apply 1 Application topically daily as needed for irritation. 120 mL 11   lubiprostone (AMITIZA) 24 MCG capsule TAKE 1 CAPSULE BY MOUTH TWICE DAILY WITH A MEAL 180 capsule 1   montelukast (SINGULAIR) 10 MG tablet TAKE 1 TABLET BY MOUTH AT BEDTIME 90 tablet 3   olmesartan (BENICAR) 5 MG tablet Take 5 mg by mouth daily.     omeprazole (PRILOSEC) 40 MG capsule Take 1 capsule by mouth twice daily 180 capsule 0   ondansetron (ZOFRAN) 4 MG tablet Take 1 tablet (4 mg total) by mouth every 6 (six) hours as needed for nausea. 20 tablet 0   propranolol (INDERAL) 20 MG tablet Take one tablet 20 mg  twice daily  may take an  additional dose of 20 mg  as needed for palpitations 190 tablet 3   rosuvastatin (CRESTOR) 40 MG tablet Take 1 tablet by mouth once daily 90 tablet 3   Testosterone 25 MG/2.5GM (1%) GEL Place 5 g onto the skin in the morning. Apply 1 packet to each shoulder qam 150 g 5   triamcinolone cream (KENALOG) 0.1 %      Vitamin D, Ergocalciferol, (DRISDOL) 1.25 MG (50000 UNIT) CAPS capsule Take 1 capsule (50,000 Units total) by mouth every 7 (seven) days. 20 capsule 1   omega-3 acid ethyl esters (LOVAZA) 1 g capsule Take 2 capsules (2 g total) by mouth 2 (two) times daily. 120 capsule 3   No facility-administered medications prior to visit.    Allergies  Allergen Reactions   Gramineae Pollens Itching   Claritin [Loratadine]  Other (See Comments)    Heart race   Loratadine-Pseudoephedrine Er Palpitations   Pollen Extract Other (See Comments)    Seasonal allergies    ROS Review of Systems  Constitutional:  Positive for fatigue. Negative for chills and fever.  HENT:  Positive for congestion. Negative for sore throat.   Eyes:  Negative for pain and discharge.  Respiratory:  Positive for shortness of breath (Intermittent). Negative for cough.   Cardiovascular:  Positive for palpitations. Negative for chest pain.  Gastrointestinal:  Positive for constipation. Negative for diarrhea, nausea and vomiting.  Endocrine: Negative for polydipsia and polyuria.  Genitourinary:  Negative for dysuria and hematuria.  Musculoskeletal:  Positive for arthralgias, back pain, myalgias and neck pain. Negative for neck stiffness.  Neurological:  Positive for tremors. Negative for headaches.  Psychiatric/Behavioral:  Positive for sleep disturbance. Negative for agitation and behavioral problems. The patient is nervous/anxious.       Objective:    Physical Exam Vitals reviewed.  Constitutional:      General: He is not in acute distress.    Appearance: He is not diaphoretic.  HENT:     Head: Normocephalic and atraumatic.     Nose: Congestion present.     Mouth/Throat:     Mouth: Mucous membranes are moist.  Eyes:     General: No scleral icterus.    Extraocular Movements: Extraocular movements intact.  Cardiovascular:     Rate and Rhythm: Normal rate and regular rhythm.     Pulses: Normal pulses.     Heart sounds: Normal heart sounds. No murmur heard. Pulmonary:     Breath  sounds: Normal breath sounds. No wheezing or rales.  Abdominal:     General: Bowel sounds are normal.     Palpations: Abdomen is soft.     Tenderness: There is no abdominal tenderness.  Musculoskeletal:        General: Tenderness (Mid thoracic and lumbar spine area) present.     Cervical back: Neck supple. No tenderness.     Right lower leg: No  edema.     Left lower leg: No edema.  Skin:    General: Skin is warm.     Coloration: Skin is not jaundiced.  Neurological:     General: No focal deficit present.     Mental Status: He is alert and oriented to person, place, and time.     Sensory: No sensory deficit.     Motor: No weakness.     Comments: Functional tremors on right hand  Psychiatric:        Mood and Affect: Mood is anxious.        Behavior: Behavior normal.        Thought Content: Thought content does not include homicidal or suicidal ideation.     BP 124/78 (BP Location: Right Arm, Patient Position: Sitting, Cuff Size: Large)   Pulse 71   Ht 6\' 3"  (1.905 m)   Wt 272 lb 6.4 oz (123.6 kg)   SpO2 94%   BMI 34.05 kg/m  Wt Readings from Last 3 Encounters:  01/15/24 272 lb 6.4 oz (123.6 kg)  11/14/23 273 lb 6.4 oz (124 kg)  10/28/23 279 lb (126.6 kg)    Lab Results  Component Value Date   TSH 3.110 12/02/2022   Lab Results  Component Value Date   WBC 9.1 09/29/2023   HGB 16.5 12/15/2023   HCT 50.7 12/15/2023   MCV 92.2 09/29/2023   PLT 266 09/29/2023   Lab Results  Component Value Date   NA 138 09/24/2023   K 4.5 09/24/2023   CO2 21 (L) 09/24/2023   GLUCOSE 98 09/24/2023   BUN 23 (H) 09/24/2023   CREATININE 1.40 (H) 09/24/2023   BILITOT 1.0 09/24/2023   ALKPHOS 64 09/24/2023   AST 46 (H) 09/24/2023   ALT 61 (H) 09/24/2023   PROT 7.5 09/24/2023   ALBUMIN 4.5 09/24/2023   CALCIUM 9.8 09/24/2023   ANIONGAP 11 09/24/2023   EGFR 48 (L) 08/06/2023   GFR 68.05 10/02/2021   Lab Results  Component Value Date   CHOL 128 05/22/2023   Lab Results  Component Value Date   HDL 32 (L) 05/22/2023   Lab Results  Component Value Date   LDLCALC 52 05/22/2023   Lab Results  Component Value Date   TRIG 279 (H) 05/22/2023   Lab Results  Component Value Date   CHOLHDL 4.0 05/22/2023   Lab Results  Component Value Date   HGBA1C 6.7 08/25/2023      Assessment & Plan:   Problem List Items  Addressed This Visit       Cardiovascular and Mediastinum   Essential hypertension   BP Readings from Last 1 Encounters:  01/15/24 124/78   Well-controlled with Amlodipine 5 mg once daily and Olmesartan 5 mg QD On propranolol for PSVT and tremors Counseled for compliance with the medications Advised DASH diet and moderate exercise/walking, at least 150 mins/week        Respiratory   Esophageal hiatal hernia   Followed by GI and General surgery - was advised surgical repair, planning to get it done  later this year        Endocrine   Hypogonadism, male   Testosterone level was WNL recently Has testosterone gel now Followed by urology      Type 2 diabetes mellitus with diabetic chronic kidney disease (HCC)   Lab Results  Component Value Date   HGBA1C 6.7 08/25/2023   Overall well-controlled On Jardiance 10 mg QD Has CGM -used to have hypoglycemia, no recent episode of hypoglycemia, blood glucose in range - 85% Advised to follow diabetic diet F/u CMP, HbA1c and lipid panel Diabetic eye exam: Advised to follow up with Ophthalmology for diabetic eye exam  Has CKD stage 3a, followed by Nephrology, needs to improve hydration      Relevant Orders   Hemoglobin A1c     Genitourinary   Chronic kidney disease, stage 3a (HCC)   Last BMP showed stable GFR - 54 Maintain adequate hydration On Jardiance Check CMP      Relevant Orders   Vitamin D (25 hydroxy)     Other   Anxiety (Chronic)   Overall well-controlled now Was on Prozac 20 mg QD Was later placed on Cymbalta for neuropathy Prozac and Cymbalta together might be causing excessive sweating, tapered and discontinued Prozac Hydroxyzine as needed for anxiety      Relevant Medications   DULoxetine (CYMBALTA) 30 MG capsule   Mixed hyperlipidemia (Chronic)   Relevant Orders   Lipid Profile   Encounter for general adult medical examination with abnormal findings - Primary   Physical exam as documented. Fasting  blood tests ordered.      Depression, recurrent (HCC)   Was on Prozac, but was discontinued due to excessive sweating Overall well controlled with Cymbalta 30 mg twice daily currently, prescribed by neurology for neuropathy      Relevant Medications   DULoxetine (CYMBALTA) 30 MG capsule   Other Relevant Orders   TSH + free T4    No orders of the defined types were placed in this encounter.   Follow-up: Return in about 4 months (around 05/16/2024) for HTN and DM.    Anabel Halon, MD

## 2024-01-15 NOTE — Assessment & Plan Note (Signed)
 Testosterone level was WNL recently Has testosterone gel now Followed by urology

## 2024-01-15 NOTE — Patient Instructions (Addendum)
Please continue to take medications as prescribed.  Please continue to follow low carb diet and perform moderate exercise/walking at least 150 mins/week.  Please get fasting blood tests done within a week.  

## 2024-01-15 NOTE — Assessment & Plan Note (Addendum)
 Was on Prozac, but was discontinued due to excessive sweating Overall well controlled with Cymbalta 30 mg twice daily currently, prescribed by neurology for neuropathy

## 2024-01-15 NOTE — Assessment & Plan Note (Signed)
Physical exam as documented. Fasting blood tests ordered. 

## 2024-01-15 NOTE — Assessment & Plan Note (Signed)
 Followed by GI and General surgery - was advised surgical repair, planning to get it done later this year

## 2024-01-15 NOTE — Assessment & Plan Note (Signed)
 Last BMP showed stable GFR - 54 Maintain adequate hydration On Jardiance Check CMP

## 2024-01-15 NOTE — Assessment & Plan Note (Addendum)
 BP Readings from Last 1 Encounters:  01/15/24 124/78   Well-controlled with Amlodipine 5 mg once daily and Olmesartan 5 mg QD On propranolol for PSVT and tremors Counseled for compliance with the medications Advised DASH diet and moderate exercise/walking, at least 150 mins/week

## 2024-01-15 NOTE — Assessment & Plan Note (Addendum)
 Lab Results  Component Value Date   HGBA1C 6.7 08/25/2023   Overall well-controlled On Jardiance 10 mg QD Has CGM -used to have hypoglycemia, no recent episode of hypoglycemia, blood glucose in range - 85% Advised to follow diabetic diet F/u CMP, HbA1c and lipid panel Diabetic eye exam: Advised to follow up with Ophthalmology for diabetic eye exam  Has CKD stage 3a, followed by Nephrology, needs to improve hydration

## 2024-01-15 NOTE — Assessment & Plan Note (Signed)
 Overall well-controlled now Was on Prozac 20 mg QD Was later placed on Cymbalta for neuropathy Prozac and Cymbalta together might be causing excessive sweating, tapered and discontinued Prozac Hydroxyzine as needed for anxiety

## 2024-01-24 ENCOUNTER — Other Ambulatory Visit: Payer: Self-pay | Admitting: Family Medicine

## 2024-01-24 DIAGNOSIS — E1122 Type 2 diabetes mellitus with diabetic chronic kidney disease: Secondary | ICD-10-CM

## 2024-01-27 ENCOUNTER — Ambulatory Visit: Payer: Self-pay | Admitting: Internal Medicine

## 2024-01-27 ENCOUNTER — Other Ambulatory Visit: Payer: Self-pay | Admitting: Cardiology

## 2024-01-27 NOTE — Telephone Encounter (Signed)
   Chief Complaint: bilateral foot pain/swelling  Symptoms: pain and swelling   Disposition: [] ED /[x] Urgent Care (no appt availability in office) / [] Appointment(In office/virtual)/ []  [facility]/ [] Home Care/ [] Refused Recommended Disposition /[] [place] [facility]/ []  Follow-up with PCP Additional Notes: Pt called with bilateral foot swelling and pain. Pt stated the right is worse. Pt woke up with it this issue and had to walk on heel. Pt is at work and is unable to describe appearance of feet. Pt states there has been no injury. Pt is diabetic and states, "my sugars have been pretty good unless I eat something bad." No provider appts available today/tomorrow. Pt doesn't want to go to another [facility] practice. RN advised pt to go to urgent then call back for follow up appt with PCP. RN gave care advice and pt verbalized understanding.       Copied from CRM [id number]. Topic: Clinical - Red Word Triage >> Jan 27, 2024 10:10 AM [name] wrote: [facility] that prompted transfer to Nurse Triage: The patients right foot is swollen and the left foot is starting to swell. He is unable to walk on his right foot. Reason for Disposition  [1] Swollen foot AND [2] no fever  (Exceptions: localized bump from bunions, calluses, insect bite, sting)  Answer Assessment - Initial Assessment Questions 1. ONSET: "When did the pain start?"      This morning  2. LOCATION: "Where is the pain located?"      Right foot swollen-right below toes  3. PAIN: "How bad is the pain?"    (Scale 1-10; or mild, moderate, severe)  - MILD (1-3): doesn't interfere with normal activities.   - MODERATE (4-7): interferes with normal activities (e.g., work or school) or awakens from sleep, limping.   - SEVERE (8-10): excruciating pain, unable to do any normal activities, unable to walk.      Moderate  4. WORK OR EXERCISE: "Has there been any recent work or exercise that involved this part of the body?"      Walking  5.  CAUSE: "What do you think is causing the foot pain?"     Swelling  6. OTHER SYMPTOMS: "Do you have any other symptoms?" (e.g., leg pain, rash, fever, numbness)     Denies  Protocols used: Foot Pain-A-AH

## 2024-01-28 ENCOUNTER — Ambulatory Visit
Admission: EM | Admit: 2024-01-28 | Discharge: 2024-01-28 | Disposition: A | Discharge status: Home / Self Care | Attending: Nurse Practitioner | Admitting: Nurse Practitioner

## 2024-01-28 DIAGNOSIS — Z8639 Personal history of other endocrine, nutritional and metabolic disease: Secondary | ICD-10-CM

## 2024-01-28 DIAGNOSIS — G629 Polyneuropathy, unspecified: Secondary | ICD-10-CM | POA: Diagnosis not present

## 2024-01-28 DIAGNOSIS — Z8669 Personal history of other diseases of the nervous system and sense organs: Secondary | ICD-10-CM | POA: Diagnosis not present

## 2024-01-28 DIAGNOSIS — G5793 Unspecified mononeuropathy of bilateral lower limbs: Secondary | ICD-10-CM | POA: Diagnosis not present

## 2024-01-28 MED ORDER — CAPSAICIN 0.075 % EX CREA: 1.0000 | TOPICAL_CREAM | Freq: Four times a day (QID) | CUTANEOUS | 0 refills | Status: DC

## 2024-01-28 NOTE — Discharge Instructions (Addendum)
 Vitamin B12, CBC, CMP are pending.  You will be able to access to results via MyChart.  You will also be contacted if your pending test results are abnormal.  Please make sure that you show the results to your doctor at your appointment on tomorrow. Apply medication as prescribed.  Continue your current medications. May take over-the-counter Tylenol as needed for pain, fever, or general discomfort. Recommend warm Epsom salt soaks 2-3 times daily as needed for foot pain or discomfort. You may freeze 2 water bottles to roll under your feet to help with pain or discomfort. Make sure you are wearing shoes with good insole and support. Recommend elevating the lower extremities above the level of the heart to help with pain or swelling. Follow-up with your primary care as scheduled. Follow-up as needed.

## 2024-01-28 NOTE — ED Triage Notes (Signed)
 Pt reports swelling in the feet since yesterday, right foot still painful to walk on the left foot swelling has gone down, tingling feeling when applying pressure to both feet.

## 2024-01-28 NOTE — ED Provider Notes (Signed)
 [facility]    CSN: [id number] Arrival date & time: 01/28/24  1011      History   Chief Complaint No chief complaint on file.   HPI Elijah Shaffer is a 43 y.o. male.   The history is provided by the patient.   Patient presents for complaints of bilateral foot pain with swelling.  Patient states he has a history of polyneuropathy.  States that he usually has numbness and tingling in his feet and legs, but states over the past several days symptoms have worsened.  He states that he was unable to stand on his feet 1 day ago, that has since improved.  Patient with underlying history of type 2 diabetes.  He is currently taking Jardiance.  Patient denies injury, trauma, joint swelling, fever, chills, bruising, or erythema of his lower extremities and feet.  Patient states that he currently takes gabapentin and Cymbalta for neuropathy.  He states he is scheduled to see his PCP tomorrow.  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  He states that he does wear comfortable shoes when he is working.  States that he did purchase a pair of insoles previously, which initially helped, states they are no longer helping with his current symptoms.  Past Medical History:  Diagnosis Date   Acute renal injury (HCC) 06/28/2017   Anxiety    Aspiration pneumonia of right upper lobe due to gastric secretions (HCC)    Diabetes mellitus without complication (HCC)    Diverticulitis    GERD (gastroesophageal reflux disease) 01/08/2018   Hypertension    Prediabetes    Seasonal allergies    Sleep apnea    Syncope 06/26/2021    Patient Active Problem List   Diagnosis Date Noted   Melena 10/08/2023   Type 2 diabetes mellitus with diabetic chronic kidney disease (HCC) 10/01/2023   Vitamin D deficiency 10/01/2023   Depression, recurrent (HCC) 10/01/2023   Abdominal distention 08/19/2023   Pectoralis muscle strain 08/06/2023   Hypercalcemia  08/06/2023   Chronic fatigue 08/06/2023   Chronic kidney disease, stage 3a (HCC) 07/10/2023   Excessive sweating 05/14/2023   Neuropathy 05/14/2023   Hypogonadism, male 03/24/2023   Essential hypertension 03/24/2023   Erythrocytosis 12/03/2022   Encounter for general adult medical examination with abnormal findings 12/02/2022   OSA (obstructive sleep apnea) 12/02/2022   Urinary retention 11/05/2022   Lumbar radiculopathy 09/26/2022   Cervical spinal stenosis 07/29/2022   DDD (degenerative disc disease), lumbar 07/29/2022   NASH (nonalcoholic steatohepatitis) 06/10/2022   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/2022   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   PSVT (paroxysmal supraventricular tachycardia) (HCC) 03/25/2022   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   Allergic rhinitis 01/30/2022   Postural dizziness with presyncope 01/30/2022   Encounter for examination following treatment at hospital 12/31/2021   Atypical angina (HCC) 12/10/2021   Chronic sinusitis 12/04/2021   Mixed hyperlipidemia 11/30/2021   Esophageal hiatal hernia 11/22/2021   Irritable bowel syndrome with constipation 11/22/2021   Anxiety 06/26/2021   Eczema 06/26/2021   Barrett's esophagus 04/11/2021   History of colonic polyps 04/11/2021   Focal neurological deficit 02/23/2021   GERD (gastroesophageal reflux disease) 01/08/2018    Past Surgical History:  Procedure Laterality Date   BIOPSY  03/18/2021   Procedure: BIOPSY;  Surgeon: Dolores Frame, MD;  Location: [facility];  Service: Gastroenterology;;  esophageal at Z-line   BIOPSY  05/23/2021   Procedure: BIOPSY;  Surgeon: Dolores Frame, MD;  [facility]: [facility];  Service: Gastroenterology;;   BIOPSY  08/19/2023   Procedure: BIOPSY;  Surgeon: Marguerita Merles, Reuel Boom, MD;  Location: [facility];  Service: Gastroenterology;;   CARDIOPULMONARY EXERCISE TEST (CPX)  01/09/2022    Interpretation limited by submaximal effort.  Severe functional impairment when compared to max sedentary norms.  No cardiopulmonary limitations.  Noted tremor and generalized weakness that lead to premature exercise termination-consider neuromuscular evaluation.   COLONOSCOPY N/A 09/22/2017   Procedure: COLONOSCOPY;  Surgeon: Malissa Hippo, MD;  Location: [facility];  Service: Endoscopy;  Laterality: N/A;  9:55   COLONOSCOPY WITH PROPOFOL N/A 05/23/2021   Procedure: COLONOSCOPY WITH PROPOFOL;  Surgeon: Dolores Frame, MD;  [facility]: [facility];  Service: Gastroenterology;  Laterality: N/A;  7:30   ESOPHAGOGASTRODUODENOSCOPY (EGD) WITH PROPOFOL N/A 03/18/2021   Procedure: ESOPHAGOGASTRODUODENOSCOPY (EGD) WITH PROPOFOL;  Surgeon: Dolores Frame, MD;  [facility]: [facility];  Service: Gastroenterology;  Laterality: N/A;   ESOPHAGOGASTRODUODENOSCOPY (EGD) WITH PROPOFOL N/A 05/23/2021   Procedure: ESOPHAGOGASTRODUODENOSCOPY (EGD) WITH PROPOFOL;  Surgeon: Dolores Frame, MD;  [facility]: [facility];  Service: Gastroenterology;  Laterality: N/A;   ESOPHAGOGASTRODUODENOSCOPY (EGD) WITH PROPOFOL N/A 08/19/2023   Procedure: ESOPHAGOGASTRODUODENOSCOPY (EGD) WITH PROPOFOL;  Surgeon: Dolores Frame, MD;  [facility]: [facility];  Service: Gastroenterology;  Laterality: N/A;  11:45AM;ASA 3   ESOPHAGOGASTRODUODENOSCOPY (EGD) WITH PROPOFOL N/A 10/08/2023   Procedure: ESOPHAGOGASTRODUODENOSCOPY (EGD) WITH PROPOFOL;  Surgeon: Dolores Frame, MD;  [facility];  Service: Gastroenterology;  Laterality: N/A;  1:45PM;ASA 1-2   GIVENS CAPSULE STUDY N/A 10/08/2023   Procedure: GIVENS CAPSULE STUDY;  Surgeon: Dolores Frame, MD;  [facility]: [facility];  Service: Gastroenterology;  Laterality: N/A;  1:45PM;ASA 1-2   POLYPECTOMY  09/22/2017   Procedure: POLYPECTOMY;  Surgeon: Malissa Hippo, MD;  Location: [facility];  Service:  Endoscopy;;   RIGHT/LEFT HEART CATH AND CORONARY ANGIOGRAPHY N/A 12/12/2021   Procedure: RIGHT/LEFT HEART CATH AND CORONARY ANGIOGRAPHY;  Surgeon: Dolores Patty, MD;  Location: [facility];  Service: Cardiovascular:: Normal Coronaries & Hemodynamics.  EF 50-55%. RA = 4 mmHg, RV = 23/4; PA = 22/6 (14) & PCW = 7; Ao sat = 95%; PA sat = 76%, 76%& SVC sat = 74%Fick cardiac output/index = 7.1/3.0; PVR = 1.0 WU   TRANSTHORACIC ECHOCARDIOGRAM  02/24/2021   EF 60 to 65%.  Normal LV function.  Normal valves.  Mild RV dilation with normal function.   ZIO PATCH EVENT MONITOR  01/2022   Sinus rhythm with heart rate range 24 to 159 bpm, 1  AVB and Wenckebach block noted along with rare PACs and PVCs.  No arrhythmias.       Home Medications    Prior to Admission medications   Medication Sig Start Date End Date Taking? Authorizing Provider  amLODipine (NORVASC) 5 MG tablet Take 1 tablet by mouth once daily 12/03/23   Anabel Halon, MD  b complex vitamins capsule Take 1 capsule by mouth daily. With D3 gummie    [provider]  Blood Glucose Monitoring Suppl DEVI 1 each by Does not apply route in the morning, at noon, and at bedtime. May substitute to any manufacturer covered by patient's insurance. 10/13/23   Anabel Halon, MD  cetirizine (ZYRTEC) 10 MG tablet Take 10 mg by mouth daily.  [provider]  Cholecalciferol (VITAMIN D-3 PO) Take by mouth daily at 6 (six) AM.    [provider]  clindamycin (CLEOCIN T) 1 % external solution Apply topically to acne bumps at aa's qd 06/17/22   Willeen Niece, MD  clobetasol (TEMOVATE) 0.05 % external solution Apply 1 application. topically 2 (two) times daily. Use as directed.  Mix with cerave cream. Use for up to 4 weeks. Avoid applying to face, groin, and axilla. Use as directed. 03/21/22   Willeen Niece, MD  Continuous Glucose Receiver (DEXCOM G6 RECEIVER) DEVI Use to check blood sugar daily DX e11.65 10/23/23   Anabel Halon, MD  Continuous Glucose Sensor (DEXCOM G6 SENSOR) MISC Use to check blood sugar daily DX e11.65 10/23/23   Anabel Halon, MD  Continuous Glucose Transmitter (DEXCOM G6 TRANSMITTER) MISC Use to check blood sugar daily DX e11.65 10/23/23   Anabel Halon, MD  cyclobenzaprine (FLEXERIL) 5 MG tablet Take 1 tablet (5 mg total) by mouth at bedtime as needed for muscle spasms. 08/06/23   Anabel Halon, MD  diphenhydrAMINE-zinc acetate (BENADRYL EXTRA STRENGTH) cream Apply 1 application topically 3 (three) times daily as needed for itching. 12/07/21   Paseda, Baird Kay, FNP  docusate sodium (COLACE) 100 MG capsule Take 100 mg by mouth 2 (two) times daily.    [provider]  DULoxetine (CYMBALTA) 30 MG capsule Take 30 mg by mouth 2 (two) times daily. 01/05/24   [provider]  fenofibrate (TRICOR) 145 MG tablet Take 1 tablet (145 mg total) by mouth daily. Please call [phone] to schedule an overdue appointment for future refills. Thank you. FINAL ATTEMPT. 01/27/24   Marykay Lex, MD  fluocinonide (LIDEX) 0.05 % external solution Apply once or twice daily to affected areas on scalp up to 2 weeks as needed for itching. Avoid applying to face, groin, and axilla. 06/11/22   Willeen Niece, MD  fluticasone [facility] ALLERGY RELIEF) 50 MCG/ACT nasal spray Place 2 sprays into both nostrils daily as needed for allergies or rhinitis. 11/14/23   Anabel Halon, MD  gabapentin (NEURONTIN) 300 MG capsule Take 300 mg by mouth 3 (three) times daily.    [provider]  Glucose Blood (BLOOD GLUCOSE TEST STRIPS) STRP 1 each by In Vitro route in the morning, at noon, and at bedtime. May substitute to any manufacturer covered by patient's insurance. 10/13/23   Anabel Halon, MD  hydrOXYzine (VISTARIL) 25 MG capsule TAKE 1 TO 2 CAPSULES BY MOUTH EVERY DAY BEFORE BEDTIME AS NEEDED FOR  ITCHING.  CAN  CAUSE  DROWSINESS 01/05/24   Willeen Niece, MD  JARDIANCE 10 MG TABS tablet TAKE 1 TABLET  BY MOUTH ONCE DAILY BEFORE BREAKFAST 01/26/24   Anabel Halon, MD  ketoconazole (NIZORAL) 2 % shampoo Apply 1 Application topically daily as needed for irritation. 06/11/22   Willeen Niece, MD  lubiprostone (AMITIZA) 24 MCG capsule TAKE 1 CAPSULE BY MOUTH TWICE DAILY WITH A MEAL 12/29/23   Carlan, Chelsea L, NP  montelukast (SINGULAIR) 10 MG tablet TAKE 1 TABLET BY MOUTH AT BEDTIME 08/12/23   Martina Sinner, MD  olmesartan (BENICAR) 5 MG tablet Take 5 mg by mouth daily. 12/17/23   [provider]  omeprazole (PRILOSEC) 40 MG capsule Take 1 capsule by mouth twice daily 12/15/23   Marguerita Merles, Reuel Boom, MD  ondansetron (ZOFRAN) 4 MG tablet Take 1 tablet (4 mg total) by mouth every 6 (six) hours as needed for nausea.  06/04/21   Bing Neighbors, NP  propranolol (INDERAL) 20 MG tablet Take one tablet 20 mg  twice daily  may take an  additional dose of 20 mg  as needed for palpitations 03/25/22   Marykay Lex, MD  rosuvastatin (CRESTOR) 40 MG tablet Take 1 tablet (40 mg total) by mouth daily. 01/15/24   Marykay Lex, MD  Testosterone 25 MG/2.5GM (1%) GEL Place 5 g onto the skin in the morning. Apply 1 packet to each shoulder qam 11/13/23   Bjorn Pippin, MD  triamcinolone cream (KENALOG) 0.1 %  03/07/22   [provider]  Vitamin D, Ergocalciferol, (DRISDOL) 1.25 MG (50000 UNIT) CAPS capsule Take 1 capsule (50,000 Units total) by mouth every 7 (seven) days. 10/01/23   Gilmore Laroche, FNP    Family History Family History  Problem Relation Age of Onset   Healthy Mother    Cervical cancer Mother    Cancer Father    Colon cancer Maternal Grandmother    Colon cancer Maternal Grandfather     Social History Social History   Tobacco Use   Smoking status: Never    Passive exposure: Past   Smokeless tobacco: Never  Vaping Use   Vaping status: Never Used  Substance Use Topics   Alcohol use: Not Currently    Comment: occasionally   Drug use: No     Allergies   Gramineae  pollens, Claritin [loratadine], Loratadine-pseudoephedrine er, and Pollen extract   Review of Systems Review of Systems Per HPI  Physical Exam Triage Vital Signs ED Triage Vitals  Encounter Vitals Group     BP 01/28/24 1102 109/75     Systolic BP Percentile --      Diastolic BP Percentile --      Pulse Rate 01/28/24 1102 78     Resp 01/28/24 1102 (!) 22     Temp 01/28/24 1102 98.9 F (37.2 C)     Temp Source 01/28/24 1102 Oral     SpO2 01/28/24 1102 92 %     Weight --      Height --      Head Circumference --      Peak Flow --      Pain Score 01/28/24 1101 6     Pain Loc --      Pain Education --      Exclude from Growth Chart --    No data found.  Updated Vital Signs BP 109/75 (BP Location: Right Arm)   Pulse 78   Temp 98.9 F (37.2 C) (Oral)   Resp (!) 22   SpO2 92%   Visual Acuity Right Eye Distance:   Left Eye Distance:   Bilateral Distance:    Right Eye Near:   Left Eye Near:    Bilateral Near:     Physical Exam Vitals and nursing note reviewed.  Constitutional:      General: He is not in acute distress.    Appearance: Normal appearance.  HENT:     Head: Normocephalic.  Eyes:     Extraocular Movements: Extraocular movements intact.     Conjunctiva/sclera: Conjunctivae normal.     Pupils: Pupils are equal, round, and reactive to light.  Cardiovascular:     Rate and Rhythm: Normal rate and regular rhythm.     Pulses: Normal pulses.     Heart sounds: Normal heart sounds.  Pulmonary:     Effort: Pulmonary effort is normal. No respiratory distress.     Breath sounds:  Normal breath sounds. No stridor. No wheezing, rhonchi or rales.  Abdominal:     General: Bowel sounds are normal.     Palpations: Abdomen is soft.     Tenderness: There is no abdominal tenderness.  Musculoskeletal:     Cervical back: Normal range of motion.     Right foot: Normal range of motion and normal capillary refill. Tenderness (tenderness noted to foot pad) present. No  deformity, bunion, Charcot foot or foot drop. Normal pulse.     Left foot: Normal range of motion and normal capillary refill. Tenderness (tenderness noted to foot pad) present. No deformity, bunion, Charcot foot or foot drop. Normal pulse.     Comments: Able to distinguish light touch, sharp and dull with bilateral feet.   Feet:     Right foot:     Skin integrity: Skin integrity normal. No skin breakdown, erythema or warmth.     Left foot:     Skin integrity: Skin integrity normal. No skin breakdown, erythema or warmth.  Lymphadenopathy:     Cervical: No cervical adenopathy.  Neurological:     General: No focal deficit present.     Mental Status: He is alert and oriented to person, place, and time.  Psychiatric:        Mood and Affect: Mood normal.        Behavior: Behavior normal.      UC Treatments / Results  Labs (all labs ordered are listed, but only abnormal results are displayed) Labs Reviewed - No data to display  EKG   Radiology No results found.  Procedures Procedures (including critical care time)  Medications Ordered in UC Medications - No data to display  Initial Impression / Assessment and Plan / UC Course  I have reviewed the triage vital signs and the nursing notes.  Pertinent labs & imaging results that were available during my care of the patient were reviewed by me and considered in my medical decision making (see chart for details).  Patient with underlying history of neuropathy.  Reviewed chart for most recent hemoglobin A1c, which was 6.7 in October 2024.  Patient is currently taking Cymbalta and gabapentin.  On exam, sensation is intact, he does have tenderness to the foot pads of the bilateral feet.  Will check CBC, CMP, and vitamin B12 for safety.  Will start capsaicin 0.075% topical cream.  Supportive care recommendations were provided and discussed with the patient to include over-the-counter analgesics such as Tylenol, warm Epsom salt soaks,  wearing shoes with good insoles, and freezing water bottles to roll under his feet.  CBC, CMP and vitamin B12 pending.  Patient is scheduled to see his PCP on 01/29/2024.  Patient advised to keep scheduled appointment.  Patient was in agreement with this plan of care and verbalized understanding.  All questions were answered.  Patient stable for discharge.  Final Clinical Impressions(s) / UC Diagnoses   Final diagnoses:  None   Discharge Instructions   None    ED Prescriptions   None    PDMP not reviewed this encounter.   Abran Cantor, NP 01/28/24 1229

## 2024-01-29 ENCOUNTER — Ambulatory Visit: Admitting: Family Medicine

## 2024-01-29 ENCOUNTER — Ambulatory Visit: Admitting: Internal Medicine

## 2024-01-29 DIAGNOSIS — F339 Major depressive disorder, recurrent, unspecified: Secondary | ICD-10-CM | POA: Diagnosis not present

## 2024-01-29 DIAGNOSIS — E782 Mixed hyperlipidemia: Secondary | ICD-10-CM | POA: Diagnosis not present

## 2024-01-29 DIAGNOSIS — E1122 Type 2 diabetes mellitus with diabetic chronic kidney disease: Secondary | ICD-10-CM | POA: Diagnosis not present

## 2024-01-29 DIAGNOSIS — N1831 Chronic kidney disease, stage 3a: Secondary | ICD-10-CM | POA: Diagnosis not present

## 2024-01-29 LAB — COMPREHENSIVE METABOLIC PANEL
ALT: 41 IU/L (ref 0–44)
AST: 33 IU/L (ref 0–40)
Albumin: 4.6 g/dL (ref 4.1–5.1)
Alkaline Phosphatase: 88 IU/L (ref 44–121)
BUN/Creatinine Ratio: 10 (ref 9–20)
BUN: 15 mg/dL (ref 6–24)
Bilirubin Total: 0.6 mg/dL (ref 0.0–1.2)
CO2: 23 mmol/L (ref 20–29)
Calcium: 10 mg/dL (ref 8.7–10.2)
Chloride: 106 mmol/L (ref 96–106)
Creatinine, Ser: 1.57 mg/dL — ABNORMAL HIGH (ref 0.76–1.27)
Globulin, Total: 2.4 g/dL (ref 1.5–4.5)
Glucose: 99 mg/dL (ref 70–99)
Potassium: 4.3 mmol/L (ref 3.5–5.2)
Sodium: 143 mmol/L (ref 134–144)
Total Protein: 7 g/dL (ref 6.0–8.5)
eGFR: 56 mL/min/{1.73_m2} — ABNORMAL LOW (ref >59)

## 2024-01-29 LAB — CBC WITH DIFFERENTIAL/PLATELET
Basophils Absolute: 0.1 10*3/uL (ref 0.0–0.2)
Basos: 1 %
EOS (ABSOLUTE): 0.1 10*3/uL (ref 0.0–0.4)
Eos: 1 %
Hematocrit: 46.9 % (ref 37.5–51.0)
Hemoglobin: 15.4 g/dL (ref 13.0–17.7)
Immature Grans (Abs): 0 10*3/uL (ref 0.0–0.1)
Immature Granulocytes: 0 %
Lymphocytes Absolute: 2.8 10*3/uL (ref 0.7–3.1)
Lymphs: 36 %
MCH: 28.2 pg (ref 26.6–33.0)
MCHC: 32.8 g/dL (ref 31.5–35.7)
MCV: 86 fL (ref 79–97)
Monocytes Absolute: 0.6 10*3/uL (ref 0.1–0.9)
Monocytes: 7 %
Neutrophils Absolute: 4.2 10*3/uL (ref 1.4–7.0)
Neutrophils: 55 %
Platelets: 288 10*3/uL (ref 150–450)
RBC: 5.47 x10E6/uL (ref 4.14–5.80)
RDW: 14.1 % (ref 11.6–15.4)
WBC: 7.7 10*3/uL (ref 3.4–10.8)

## 2024-01-29 LAB — VITAMIN B12: Vitamin B-12: 604 pg/mL (ref 232–1245)

## 2024-01-30 ENCOUNTER — Encounter: Payer: Self-pay | Admitting: Internal Medicine

## 2024-01-30 LAB — LIPID PANEL
Chol/HDL Ratio: 3.2 ratio (ref 0.0–5.0)
Cholesterol, Total: 108 mg/dL (ref 100–199)
HDL: 34 mg/dL — ABNORMAL LOW (ref >39)
LDL Chol Calc (NIH): 46 mg/dL (ref 0–99)
Triglycerides: 168 mg/dL — ABNORMAL HIGH (ref 0–149)
VLDL Cholesterol Cal: 28 mg/dL (ref 5–40)

## 2024-01-30 LAB — HEMOGLOBIN A1C
Est. average glucose Bld gHb Est-mCnc: 140 mg/dL
Hgb A1c MFr Bld: 6.5 % — ABNORMAL HIGH (ref 4.8–5.6)

## 2024-01-30 LAB — VITAMIN D 25 HYDROXY (VIT D DEFICIENCY, FRACTURES): Vit D, 25-Hydroxy: 38.1 ng/mL (ref 30.0–100.0)

## 2024-01-30 LAB — TSH+FREE T4
Free T4: 0.79 ng/dL — ABNORMAL LOW (ref 0.82–1.77)
TSH: 1.01 u[IU]/mL (ref 0.450–4.500)

## 2024-02-02 NOTE — Telephone Encounter (Unsigned)
 Copied from CRM [id number]. Topic: Clinical - Medical Advice >> Jan 30, 2024 12:45 PM Gery Pray wrote: Reason for CRM: Patient states that he is having trouble with his feet. Patient states that he thinks he should get some better shoes for the neuropathy in his feet. Patient states he went to urgent care due to his feet hurting that badly. Patient would like to know if Dr. Allena Katz read his blood work that was completed at his urgent care visit. In addition, patient thinks the pain is from his diabetes and the neuropathy. Please contact patient at [phone]

## 2024-02-02 NOTE — Telephone Encounter (Signed)
 scheduled

## 2024-02-02 NOTE — Telephone Encounter (Unsigned)
 Copied from CRM [id number]. Topic: General - Call Back - No Documentation >> Feb 02, 2024 11:03 AM [name] wrote: Reason for CRM: The patient was returning a call from the office. I advised him of the appointment that was scheduled but he states the office wanted to speak to him. I reached out to CAL but the patient disconnected the line while holding for CAL. The patient's call back number is [phone] if needed.

## 2024-02-04 ENCOUNTER — Encounter: Payer: Self-pay | Admitting: Hematology

## 2024-02-04 ENCOUNTER — Ambulatory Visit (INDEPENDENT_AMBULATORY_CARE_PROVIDER_SITE_OTHER): Admitting: Internal Medicine

## 2024-02-04 ENCOUNTER — Encounter: Payer: Self-pay | Admitting: Internal Medicine

## 2024-02-04 VITALS — BP 108/69 | HR 58 | Ht 75.0 in | Wt 267.4 lb

## 2024-02-04 DIAGNOSIS — Z7984 Long term (current) use of oral hypoglycemic drugs: Secondary | ICD-10-CM | POA: Diagnosis not present

## 2024-02-04 DIAGNOSIS — E1140 Type 2 diabetes mellitus with diabetic neuropathy, unspecified: Secondary | ICD-10-CM | POA: Diagnosis not present

## 2024-02-04 DIAGNOSIS — L84 Corns and callosities: Secondary | ICD-10-CM | POA: Diagnosis not present

## 2024-02-04 NOTE — Assessment & Plan Note (Addendum)
 Has history of peripheral neuropathy Has been evaluated by [facility] neurology On gabapentin 300 mg twice daily and Cymbalta 30 mg twice daily Referred to Podiatry for chronic foot pain - may need diabetic shoes/inserts

## 2024-02-04 NOTE — Patient Instructions (Signed)
 Please take Gabapentin 300 mg at least twice daily, in the afternoon and bedtime.  Continue taking Cymbalta twice daily as prescribed.  You are being referred to Podiatry.

## 2024-02-04 NOTE — Progress Notes (Signed)
 Acute Office Visit  Subjective:    Patient ID: Elijah Shaffer, male    DOB: 1981-10-01, 43 y.o.   MRN: [id number]  Chief Complaint  Patient presents with   Foot Problem    Pt reports sx of feet pain, would like a referral to podiatry and diabetic shoes.     HPI Patient is in today for complaint of bilateral foot pain, near the bottom of the toes.  Pain is constant, sharp at times and burning at times, nonradiating. He has pain upon walking. He went to urgent care recently, was given capsicum cream, which has helped somewhat.  He has history of peripheral neuropathy, takes gabapentin 300 mg twice daily and Cymbalta 30 mg twice daily currently.  Past Medical History:  Diagnosis Date   Acute renal injury (HCC) 06/28/2017   Anxiety    Aspiration pneumonia of right upper lobe due to gastric secretions (HCC)    Diabetes mellitus without complication (HCC)    Diverticulitis    GERD (gastroesophageal reflux disease) 01/08/2018   Hypertension    Prediabetes    Seasonal allergies    Sleep apnea    Syncope 06/26/2021    Past Surgical History:  Procedure Laterality Date   BIOPSY  03/18/2021   Procedure: BIOPSY;  Surgeon: Dolores Frame, MD;  [facility]: [facility];  Service: Gastroenterology;;  esophageal at Z-line   BIOPSY  05/23/2021   Procedure: BIOPSY;  Surgeon: Dolores Frame, MD;  [facility]: [facility];  Service: Gastroenterology;;   BIOPSY  08/19/2023   Procedure: BIOPSY;  Surgeon: Dolores Frame, MD;  [facility]: [facility];  Service: Gastroenterology;;   CARDIOPULMONARY EXERCISE TEST (CPX)  01/09/2022   Interpretation limited by submaximal effort.  Severe functional impairment when compared to max sedentary norms.  No cardiopulmonary limitations.  Noted tremor and generalized weakness that lead to premature exercise termination-consider neuromuscular evaluation.   COLONOSCOPY N/A 09/22/2017   Procedure: COLONOSCOPY;  Surgeon: Malissa Hippo, MD;  [facility]: [facility];  Service: Endoscopy;  Laterality: N/A;  9:55   COLONOSCOPY WITH PROPOFOL N/A 05/23/2021   Procedure: COLONOSCOPY WITH PROPOFOL;  Surgeon: Dolores Frame, MD;  [facility]: [facility];  Service: Gastroenterology;  Laterality: N/A;  7:30   ESOPHAGOGASTRODUODENOSCOPY (EGD) WITH PROPOFOL N/A 03/18/2021   Procedure: ESOPHAGOGASTRODUODENOSCOPY (EGD) WITH PROPOFOL;  Surgeon: Dolores Frame, MD;  [facility]: [facility];  Service: Gastroenterology;  Laterality: N/A;   ESOPHAGOGASTRODUODENOSCOPY (EGD) WITH PROPOFOL N/A 05/23/2021   Procedure: ESOPHAGOGASTRODUODENOSCOPY (EGD) WITH PROPOFOL;  Surgeon: Dolores Frame, MD;  [facility]: [facility];  Service: Gastroenterology;  Laterality: N/A;   ESOPHAGOGASTRODUODENOSCOPY (EGD) WITH PROPOFOL N/A 08/19/2023   Procedure: ESOPHAGOGASTRODUODENOSCOPY (EGD) WITH PROPOFOL;  Surgeon: Dolores Frame, MD;  [facility];  Service: Gastroenterology;  Laterality: N/A;  11:45AM;ASA 3   ESOPHAGOGASTRODUODENOSCOPY (EGD) WITH PROPOFOL N/A 10/08/2023   Procedure: ESOPHAGOGASTRODUODENOSCOPY (EGD) WITH PROPOFOL;  Surgeon: Dolores Frame, MD;  [facility];  Service: Gastroenterology;  Laterality: N/A;  1:45PM;ASA 1-2   GIVENS CAPSULE STUDY N/A 10/08/2023   Procedure: GIVENS CAPSULE STUDY;  Surgeon: Dolores Frame, MD;  [facility]: [facility];  Service: Gastroenterology;  Laterality: N/A;  1:45PM;ASA 1-2   POLYPECTOMY  09/22/2017   Procedure: POLYPECTOMY;  Surgeon: Malissa Hippo, MD;  [facility];  Service: Endoscopy;;   RIGHT/LEFT HEART CATH AND CORONARY ANGIOGRAPHY N/A 12/12/2021   Procedure: RIGHT/LEFT HEART CATH AND CORONARY ANGIOGRAPHY;  Surgeon: Arvilla Meres  R, MD;  Location: [facility];  Service: Cardiovascular:: Normal Coronaries & Hemodynamics.  EF 50-55%. RA = 4 mmHg, RV = 23/4; PA = 22/6 (14) & PCW = 7; Ao sat = 95%; PA sat =  76%, 76%& SVC sat = 74%Fick cardiac output/index = 7.1/3.0; PVR = 1.0 WU   TRANSTHORACIC ECHOCARDIOGRAM  02/24/2021   EF 60 to 65%.  Normal LV function.  Normal valves.  Mild RV dilation with normal function.   ZIO PATCH EVENT MONITOR  01/2022   Sinus rhythm with heart rate range 24 to 159 bpm, 1  AVB and Wenckebach block noted along with rare PACs and PVCs.  No arrhythmias.    Family History  Problem Relation Age of Onset   Healthy Mother    Cervical cancer Mother    Cancer Father    Colon cancer Maternal Grandmother    Colon cancer Maternal Grandfather     Social History   Socioeconomic History   Marital status: Single    Spouse name: Not on file   Number of children: Not on file   Years of education: Not on file   Highest education level: Not on file  Occupational History   Not on file  Tobacco Use   Smoking status: Never    Passive exposure: Past   Smokeless tobacco: Never  Vaping Use   Vaping status: Never Used  Substance and Sexual Activity   Alcohol use: Not Currently    Comment: occasionally   Drug use: No   Sexual activity: Not on file  Other Topics Concern   Not on file  Social History Narrative   He currently has his own lawn care landscaping service. He previous had 80 yards before he got sick and since decreased. He works around a Interior and spatial designer. One of the chemical names is [employer]up.    Social Drivers of Corporate investment banker Strain: Not on file  Food Insecurity: No Food Insecurity (06/20/2023)   Hunger Vital Sign    Worried About Running Out of Food in the Last Year: Never true    Ran Out of Food in the Last Year: Never true  Transportation Needs: No Transportation Needs (12/27/2022)   PRAPARE - Administrator, Civil Service (Medical): No    Lack of Transportation (Non-Medical): No  Physical Activity: Insufficiently Active (03/29/2022)   Exercise Vital Sign    Days of Exercise per Week: 2 days    Minutes of Exercise per Session: 10  min  Stress: Stress Concern Present (05/07/2022)   [employer] of Occupational Health - Occupational Stress Questionnaire    Feeling of Stress : To some extent  Social Connections: Not on file  Intimate Partner Violence: Not At Risk (06/20/2023)   Humiliation, Afraid, Rape, and Kick questionnaire    Fear of Current or Ex-Partner: No    Emotionally Abused: No    Physically Abused: No    Sexually Abused: No    Outpatient Medications Prior to Visit  Medication Sig Dispense Refill   amLODipine (NORVASC) 5 MG tablet Take 1 tablet by mouth once daily 90 tablet 0   b complex vitamins capsule Take 1 capsule by mouth daily. With D3 gummie     Blood Glucose Monitoring Suppl DEVI 1 each by Does not apply route in the morning, at noon, and at bedtime. May substitute to any manufacturer covered by patient's insurance. 1 each 0   capsicum (ZOSTRIX) 0.075 % topical cream Apply 1  Application topically 4 (four) times daily. 57 g 0   cetirizine (ZYRTEC) 10 MG tablet Take 10 mg by mouth daily.     Cholecalciferol (VITAMIN D-3 PO) Take by mouth daily at 6 (six) AM.     clindamycin (CLEOCIN T) 1 % external solution Apply topically to acne bumps at aa's qd 30 mL 3   clobetasol (TEMOVATE) 0.05 % external solution Apply 1 application. topically 2 (two) times daily. Use as directed.  Mix with cerave cream. Use for up to 4 weeks. Avoid applying to face, groin, and axilla. Use as directed. 50 mL 1   Continuous Glucose Receiver (DEXCOM G6 RECEIVER) DEVI Use to check blood sugar daily DX e11.65 1 each 0   Continuous Glucose Sensor (DEXCOM G6 SENSOR) MISC Use to check blood sugar daily DX e11.65 3 each 5   Continuous Glucose Transmitter (DEXCOM G6 TRANSMITTER) MISC Use to check blood sugar daily DX e11.65 1 each 3   cyclobenzaprine (FLEXERIL) 5 MG tablet Take 1 tablet (5 mg total) by mouth at bedtime as needed for muscle spasms. 30 tablet 1   diphenhydrAMINE-zinc acetate (BENADRYL EXTRA STRENGTH) cream Apply 1  application topically 3 (three) times daily as needed for itching. 28.4 g 0   docusate sodium (COLACE) 100 MG capsule Take 100 mg by mouth 2 (two) times daily.     DULoxetine (CYMBALTA) 30 MG capsule Take 30 mg by mouth 2 (two) times daily.     fenofibrate (TRICOR) 145 MG tablet Take 1 tablet (145 mg total) by mouth daily. Please call [phone] to schedule an overdue appointment for future refills. Thank you. FINAL ATTEMPT. 15 tablet 0   fluocinonide (LIDEX) 0.05 % external solution Apply once or twice daily to affected areas on scalp up to 2 weeks as needed for itching. Avoid applying to face, groin, and axilla. 60 mL 2   fluticasone (FLONASE ALLERGY RELIEF) 50 MCG/ACT nasal spray Place 2 sprays into both nostrils daily as needed for allergies or rhinitis. 16 mL 2   gabapentin (NEURONTIN) 300 MG capsule Take 300 mg by mouth 3 (three) times daily.     Glucose Blood (BLOOD GLUCOSE TEST STRIPS) STRP 1 each by In Vitro route in the morning, at noon, and at bedtime. May substitute to any manufacturer covered by patient's insurance. 100 strip 1   hydrOXYzine (VISTARIL) 25 MG capsule TAKE 1 TO 2 CAPSULES BY MOUTH EVERY DAY BEFORE BEDTIME AS NEEDED FOR  ITCHING.  CAN  CAUSE  DROWSINESS 60 capsule 0   JARDIANCE 10 MG TABS tablet TAKE 1 TABLET BY MOUTH ONCE DAILY BEFORE BREAKFAST 60 tablet 0   ketoconazole (NIZORAL) 2 % shampoo Apply 1 Application topically daily as needed for irritation. 120 mL 11   lubiprostone (AMITIZA) 24 MCG capsule TAKE 1 CAPSULE BY MOUTH TWICE DAILY WITH A MEAL 180 capsule 1   montelukast (SINGULAIR) 10 MG tablet TAKE 1 TABLET BY MOUTH AT BEDTIME 90 tablet 3   olmesartan (BENICAR) 5 MG tablet Take 5 mg by mouth daily.     omeprazole (PRILOSEC) 40 MG capsule Take 1 capsule by mouth twice daily 180 capsule 0   ondansetron (ZOFRAN) 4 MG tablet Take 1 tablet (4 mg total) by mouth every 6 (six) hours as needed for nausea. 20 tablet 0   propranolol (INDERAL) 20 MG tablet Take one tablet  20 mg  twice daily  may take an  additional dose of 20 mg  as needed for palpitations 190 tablet 3   rosuvastatin (  CRESTOR) 40 MG tablet Take 1 tablet (40 mg total) by mouth daily. 15 tablet 0   Testosterone 25 MG/2.5GM (1%) GEL Place 5 g onto the skin in the morning. Apply 1 packet to each shoulder qam 150 g 5   triamcinolone cream (KENALOG) 0.1 %      Vitamin D, Ergocalciferol, (DRISDOL) 1.25 MG (50000 UNIT) CAPS capsule Take 1 capsule (50,000 Units total) by mouth every 7 (seven) days. 20 capsule 1   No facility-administered medications prior to visit.    Allergies  Allergen Reactions   Gramineae Pollens Itching   Claritin [Loratadine] Other (See Comments)    Heart race   Loratadine-Pseudoephedrine Er Palpitations   Pollen Extract Other (See Comments)    Seasonal allergies    Review of Systems  Constitutional:  Positive for fatigue. Negative for chills and fever.  HENT:  Positive for congestion. Negative for sore throat.   Eyes:  Negative for pain and discharge.  Respiratory:  Positive for shortness of breath (Intermittent). Negative for cough.   Cardiovascular:  Positive for palpitations. Negative for chest pain.  Gastrointestinal:  Positive for constipation. Negative for diarrhea, nausea and vomiting.  Endocrine: Negative for polydipsia and polyuria.  Genitourinary:  Negative for dysuria and hematuria.  Musculoskeletal:  Positive for arthralgias, back pain, myalgias and neck pain. Negative for neck stiffness.       B/l foot pain  Neurological:  Positive for tremors. Negative for headaches.  Psychiatric/Behavioral:  Positive for sleep disturbance. Negative for agitation and behavioral problems. The patient is nervous/anxious.        Objective:    Physical Exam Vitals reviewed.  Constitutional:      General: He is not in acute distress.    Appearance: He is not diaphoretic.  HENT:     Head: Normocephalic and atraumatic.     Nose: Congestion present.     Mouth/Throat:      Mouth: Mucous membranes are moist.  Eyes:     General: No scleral icterus.    Extraocular Movements: Extraocular movements intact.  Cardiovascular:     Rate and Rhythm: Normal rate and regular rhythm.     Heart sounds: Normal heart sounds. No murmur heard. Pulmonary:     Breath sounds: Normal breath sounds. No wheezing or rales.  Musculoskeletal:        General: Tenderness (Mid thoracic and lumbar spine area) present.     Cervical back: Neck supple. No tenderness.     Right lower leg: No edema.     Left lower leg: No edema.  Feet:     Comments: Has a callus on left foot Skin:    General: Skin is warm.     Coloration: Skin is not jaundiced.  Neurological:     General: No focal deficit present.     Mental Status: He is alert and oriented to person, place, and time.     Sensory: Sensory deficit (B/l feet) present.     Motor: No weakness.     Comments: Functional tremors on right hand  Psychiatric:        Mood and Affect: Mood is anxious.        Behavior: Behavior normal.        Thought Content: Thought content does not include homicidal or suicidal ideation.     BP 108/69   Pulse (!) 58   Ht 6\' 3"  (1.905 m)   Wt 267 lb 6.4 oz (121.3 kg)   SpO2 96%   BMI 33.42  kg/m  Wt Readings from Last 3 Encounters:  02/04/24 267 lb 6.4 oz (121.3 kg)  01/15/24 272 lb 6.4 oz (123.6 kg)  11/14/23 273 lb 6.4 oz (124 kg)        Assessment & Plan:   Problem List Items Addressed This Visit       Endocrine   Type 2 diabetes mellitus with diabetic neuropathy, without long-term current use of insulin (HCC) - Primary   Has history of peripheral neuropathy Has been evaluated by [facility] neurology On gabapentin 300 mg twice daily and Cymbalta 30 mg twice daily Referred to Podiatry for chronic foot pain - may need diabetic shoes/inserts         Musculoskeletal and Integument   Callus of foot   Has a callus on left foot Referred to podiatry      Relevant Orders    Ambulatory referral to Podiatry     No orders of the defined types were placed in this encounter.    Anabel Halon, MD

## 2024-02-04 NOTE — Assessment & Plan Note (Signed)
 Has a callus on left foot Referred to podiatry

## 2024-02-07 ENCOUNTER — Other Ambulatory Visit: Payer: Self-pay | Admitting: Dermatology

## 2024-02-07 DIAGNOSIS — L2089 Other atopic dermatitis: Secondary | ICD-10-CM

## 2024-02-12 ENCOUNTER — Ambulatory Visit: Admitting: Podiatry

## 2024-02-12 DIAGNOSIS — M7752 Other enthesopathy of left foot: Secondary | ICD-10-CM

## 2024-02-12 DIAGNOSIS — M21961 Unspecified acquired deformity of right lower leg: Secondary | ICD-10-CM | POA: Diagnosis not present

## 2024-02-12 DIAGNOSIS — M7751 Other enthesopathy of right foot: Secondary | ICD-10-CM

## 2024-02-12 DIAGNOSIS — M21962 Unspecified acquired deformity of left lower leg: Secondary | ICD-10-CM | POA: Diagnosis not present

## 2024-02-12 NOTE — Progress Notes (Signed)
 Subjective:  Patient ID: Elijah Shaffer, male    DOB: [birth year],  MRN: [id number]  Chief Complaint  Patient presents with   Diabetes    Pt stated that he is a diabetic and after he works all day his feet get really hot at night     43 y.o. male presents with the above complaint.  Patient presents with bilateral second metatarsophalangeal joint capsulitis worse with ambulation is with pressure patient states he works all day on his foot.  He is a diabetic he would like to discuss treatment options for this.  He has burning sensation due to diabetes as well.  Denies any other acute complaints has not seen MRIs prior to seeing me would like to discuss treatment options   Review of Systems: Negative except as noted in the HPI. Denies N/V/F/Ch.  Past Medical History:  Diagnosis Date   Acute renal injury (HCC) 06/28/2017   Anxiety    Aspiration pneumonia of right upper lobe due to gastric secretions (HCC)    Diabetes mellitus without complication (HCC)    Diverticulitis    GERD (gastroesophageal reflux disease) 01/08/2018   Hypertension    Prediabetes    Seasonal allergies    Sleep apnea    Syncope 06/26/2021    Current Outpatient Medications:    amLODipine (NORVASC) 5 MG tablet, Take 1 tablet by mouth once daily, Disp: 90 tablet, Rfl: 0   b complex vitamins capsule, Take 1 capsule by mouth daily. With D3 gummie, Disp: , Rfl:    benzonatate (TESSALON) 100 MG capsule, Take 1 capsule (100 mg total) by mouth 3 (three) times daily as needed for cough. Do not take with alcohol or while operating or driving heavy machinery, Disp: 21 capsule, Rfl: 0   Blood Glucose Monitoring Suppl DEVI, 1 each by Does not apply route in the morning, at noon, and at bedtime. May substitute to any manufacturer covered by patient's insurance., Disp: 1 each, Rfl: 0   capsicum (ZOSTRIX) 0.075 % topical cream, Apply 1 Application topically 4 (four) times daily., Disp: 57 g, Rfl: 0   cetirizine (ZYRTEC) 10 MG  tablet, Take 10 mg by mouth daily., Disp: , Rfl:    Cholecalciferol (VITAMIN D-3 PO), Take by mouth daily at 6 (six) AM., Disp: , Rfl:    clindamycin (CLEOCIN T) 1 % external solution, Apply topically to acne bumps at aa's qd, Disp: 30 mL, Rfl: 3   clobetasol (TEMOVATE) 0.05 % external solution, Apply 1 application. topically 2 (two) times daily. Use as directed.  Mix with cerave cream. Use for up to 4 weeks. Avoid applying to face, groin, and axilla. Use as directed., Disp: 50 mL, Rfl: 1   Continuous Glucose Receiver (DEXCOM G6 RECEIVER) DEVI, Use to check blood sugar daily DX e11.65, Disp: 1 each, Rfl: 0   Continuous Glucose Sensor (DEXCOM G6 SENSOR) MISC, Use to check blood sugar daily DX e11.65, Disp: 3 each, Rfl: 5   Continuous Glucose Transmitter (DEXCOM G6 TRANSMITTER) MISC, Use to check blood sugar daily DX e11.65, Disp: 1 each, Rfl: 3   cyclobenzaprine (FLEXERIL) 5 MG tablet, Take 1 tablet (5 mg total) by mouth at bedtime as needed for muscle spasms., Disp: 30 tablet, Rfl: 1   diphenhydrAMINE-zinc acetate (BENADRYL EXTRA STRENGTH) cream, Apply 1 application topically 3 (three) times daily as needed for itching., Disp: 28.4 g, Rfl: 0   docusate sodium (COLACE) 100 MG capsule, Take 100 mg by mouth 2 (two) times daily., Disp: , Rfl:  DULoxetine (CYMBALTA) 30 MG capsule, Take 30 mg by mouth 2 (two) times daily., Disp: , Rfl:    fenofibrate (TRICOR) 145 MG tablet, Take 1 tablet (145 mg total) by mouth daily. Please call [phone] to schedule an overdue appointment for future refills. Thank you. FINAL ATTEMPT., Disp: 15 tablet, Rfl: 0   fluocinonide (LIDEX) 0.05 % external solution, Apply once or twice daily to affected areas on scalp up to 2 weeks as needed for itching. Avoid applying to face, groin, and axilla., Disp: 60 mL, Rfl: 2   fluticasone (FLONASE ALLERGY RELIEF) 50 MCG/ACT nasal spray, Place 2 sprays into both nostrils daily as needed for allergies or rhinitis., Disp: 16 mL, Rfl: 2    gabapentin (NEURONTIN) 300 MG capsule, Take 300 mg by mouth 3 (three) times daily., Disp: , Rfl:    Glucose Blood (BLOOD GLUCOSE TEST STRIPS) STRP, 1 each by In Vitro route in the morning, at noon, and at bedtime. May substitute to any manufacturer covered by patient's insurance., Disp: 100 strip, Rfl: 1   hydrOXYzine (VISTARIL) 25 MG capsule, TAKE 1 TO 2 CAPSULES BY MOUTH ONCE DAILY BEFORE  BEDTIME  AS  NEEDED  FOR  ITCHING.  CAN  CAUSE  DROWSINESS, Disp: 60 capsule, Rfl: 0   JARDIANCE 10 MG TABS tablet, TAKE 1 TABLET BY MOUTH ONCE DAILY BEFORE BREAKFAST, Disp: 60 tablet, Rfl: 0   ketoconazole (NIZORAL) 2 % shampoo, Apply 1 Application topically daily as needed for irritation., Disp: 120 mL, Rfl: 11   lubiprostone (AMITIZA) 24 MCG capsule, TAKE 1 CAPSULE BY MOUTH TWICE DAILY WITH A MEAL, Disp: 180 capsule, Rfl: 1   montelukast (SINGULAIR) 10 MG tablet, TAKE 1 TABLET BY MOUTH AT BEDTIME, Disp: 90 tablet, Rfl: 3   nirmatrelvir/ritonavir, renal dosing, (PAXLOVID) 10 x 150 MG & 10 x 100MG  TABS, Take 2 tablets by mouth 2 (two) times daily for 5 days. (Take nirmatrelvir 150 mg one tablet twice daily for 5 days and ritonavir 100 mg one tablet twice daily for 5 days) Patient GFR is 56., Disp: 20 tablet, Rfl: 0   olmesartan (BENICAR) 5 MG tablet, Take 5 mg by mouth daily., Disp: , Rfl:    omeprazole (PRILOSEC) 40 MG capsule, Take 1 capsule by mouth twice daily, Disp: 180 capsule, Rfl: 0   ondansetron (ZOFRAN) 4 MG tablet, Take 1 tablet (4 mg total) by mouth every 6 (six) hours as needed for nausea., Disp: 20 tablet, Rfl: 0   propranolol (INDERAL) 20 MG tablet, Take one tablet 20 mg  twice daily  may take an  additional dose of 20 mg  as needed for palpitations, Disp: 190 tablet, Rfl: 3   rosuvastatin (CRESTOR) 40 MG tablet, Take 1 tablet (40 mg total) by mouth daily., Disp: 15 tablet, Rfl: 0   Testosterone 25 MG/2.5GM (1%) GEL, Place 5 g onto the skin in the morning. Apply 1 packet to each shoulder qam, Disp:  150 g, Rfl: 5   triamcinolone cream (KENALOG) 0.1 %, , Disp: , Rfl:    Vitamin D, Ergocalciferol, (DRISDOL) 1.25 MG (50000 UNIT) CAPS capsule, Take 1 capsule (50,000 Units total) by mouth every 7 (seven) days., Disp: 20 capsule, Rfl: 1  Social History   Tobacco Use  Smoking Status Never   Passive exposure: Past  Smokeless Tobacco Never    Allergies  Allergen Reactions   Gramineae Pollens Itching   Claritin [Loratadine] Other (See Comments)    Heart race   Loratadine-Pseudoephedrine Er Palpitations   Pollen Extract  Other (See Comments)    Seasonal allergies   Objective:  There were no vitals filed for this visit. There is no height or weight on file to calculate BMI. Constitutional Well developed. Well nourished.  Vascular Dorsalis pedis pulses palpable bilaterally. Posterior tibial pulses palpable bilaterally. Capillary refill normal to all digits.  No cyanosis or clubbing noted. Pedal hair growth normal.  Neurologic Normal speech. Oriented to person, place, and time. Epicritic sensation to light touch grossly present bilaterally.  Dermatologic Nails well groomed and normal in appearance. No open wounds. No skin lesions.  Orthopedic: Pain on palpation bilateral second metatarsophalangeal joint pain with range of motion of the joint.  No deep intra-articular pain noted.  No extensor or flexor tendinitis noted.  Gait examination shows pes planovalgus deformity   Radiographs: None Assessment:   1. Capsulitis of metatarsophalangeal (MTP) joint of right foot   2. Capsulitis of metatarsophalangeal (MTP) joint of left foot   3. Deformity of both feet    Plan:  Patient was evaluated and treated and all questions answered.  Bilateral second metatarsophalangeal joint capsulitis - All questions and concerns were discussed with the patient extensive detail given the amount of pain that he is experiencing he will benefit from steroid injection of decrease inflammatory component  associate with pain.  Patient agrees with the plan would like to proceed with steroid injection -A steroid injection was performed at bilateral second using 1% plain Lidocaine and 10 mg of Kenalog. This was well tolerated.   Pes planovalgus/foot deformity -I explained to patient the etiology of pes planovalgus and relationship with Planter fasciitis and various treatment options were discussed.  Given patient foot structure in the setting of Planter fasciitis I believe patient will benefit from custom-made orthotics to help control the hindfoot motion support the arch of the foot and take the stress away from plantar fascial.  Patient agrees with the plan like to proceed with orthotics -Patient was casted for orthotics with offloading of bilateral second metatarsal phalangeal joint

## 2024-02-13 ENCOUNTER — Ambulatory Visit: Payer: 59 | Admitting: Internal Medicine

## 2024-02-13 NOTE — Patient Instructions (Signed)
 Visit Information  Thank you for taking time to visit with me today. Please don't hesitate to contact me if I can be of assistance to you.   Following are the goals we discussed today:   Goals Addressed             This Visit's Progress    Manage abdominal pain, hypertension, obtain eye and dental appointment- RN CM care coordination services       Interventions Today    Flowsheet Row Most Recent Value  Chronic Disease   Chronic disease during today's visit Hypertension (HTN), Other  [disability, nutrition appointment, monitoring incoming calls]  General Interventions   General Interventions Discussed/Reviewed General Interventions Reviewed, [facility], Doctor Visits  Doctor Visits Discussed/Reviewed Doctor Visits Reviewed, PCP  PCP/Specialist Visits Compliance with follow-up visit  Education Interventions   Education Provided Provided Education  Provided Verbal Education On [facility], Nutrition, When to see the doctor  [place], nutrition]  Mental Health Interventions   Mental Health Discussed/Reviewed Mental Health Reviewed, Coping Strategies  Nutrition Interventions   Nutrition Discussed/Reviewed Nutrition Reviewed, Adding fruits and vegetables, Fluid intake, Portion sizes, Decreasing salt, Decreasing fats  Pharmacy Interventions   Pharmacy Dicussed/Reviewed Pharmacy Topics Reviewed, Affording Medications  Advanced Directive Interventions   Advanced Directives Discussed/Reviewed Advanced Directives Discussed  [declined]              Our next appointment is no further scheduled appointments.  on as needed at as needed  Please call the care guide team at [phone] if you need to cancel or reschedule your appointment.   If you are experiencing a Mental Health or Behavioral Health Crisis or need someone to talk to, please call the Suicide and Crisis Lifeline: 988 call the [place] National Suicide Prevention Lifeline: 254-063-2325 or TTY:  917-783-2951 TTY (248)321-8728) to talk to a trained counselor call 1-800-273-TALK (toll free, 24 hour hotline) call the [facility]: [phone] call 911   Patient verbalizes understanding of instructions and care plan provided today and agrees to view in MyChart. Active MyChart status and patient understanding of how to access instructions and care plan via MyChart confirmed with patient.     The patient has been provided with contact information for the care management team and has been advised to call with any health related questions or concerns.    Jafari Mckillop L. Noelle Penner, RN, BSN, CCM [facility] RN Care Manager [phone]

## 2024-02-13 NOTE — Telephone Encounter (Unsigned)
 Copied from CRM [id number]. Topic: Clinical - Medical Advice >> Feb 13, 2024 11:20 AM Marlow Baars wrote: Reason for CRM: The patient called in stating he saw a Podiatrist in [facility] yesterday and he was told he needs special diabetic shoes. Since his feet are flat he was told he would need a wide shoe. He was also told that the podiatrist couldn't write the prescription it would need to come from his primary care provider. The patient states his insurance will pay on it if he can get a script sent over to [facility]. Please assist patient further

## 2024-02-18 ENCOUNTER — Telehealth: Payer: Self-pay

## 2024-02-18 NOTE — Telephone Encounter (Signed)
 Called Pharmacy to confirm Rx fix per MD Annabell Howells they voiced their understanding

## 2024-02-18 NOTE — Telephone Encounter (Signed)
-----   Message from Bjorn Pippin sent at 02/18/2024 12:02 PM EDT ----- HE could get the 5gm and use 1/2 packet on each arm as they suggest. ----- Message ----- From: Sarajane Jews, CMA Sent: 02/18/2024   8:37 AM EDT To: Bjorn Pippin, MD  FYI and advise

## 2024-02-19 NOTE — Telephone Encounter (Signed)
 Called and spoke with patient. Patient understands he was referred to Podiatry to obtain prescription shoes and understands that Dr. Delia Chimes will sign off on form. Made patient aware of my attempt to coordinate with TFA and will follow up with patient with resolution. Patient agreeable.

## 2024-02-19 NOTE — Telephone Encounter (Unsigned)
 Copied from CRM [id number]. Topic: Clinical - Medical Advice >> Feb 19, 2024  2:45 PM Elizebeth Brooking wrote: Reason for CRM: Patient called in regarding status of the prescription he needs sent over for the podiatrist, informed him per Dr.Patel that they would have to prescribe it, patient is getting frustrated because he is getting the run around from both providers, would like for some to contact them to get this straighten out

## 2024-02-19 NOTE — Telephone Encounter (Signed)
 Called Dr. Caryn Bee Patel's office, [facility] in [place] in regard to prescription. Department did not answer call. Left voicemail to call [facility] or patient back.

## 2024-02-21 ENCOUNTER — Encounter: Payer: Self-pay | Admitting: Cardiology

## 2024-02-23 ENCOUNTER — Telehealth: Payer: Self-pay

## 2024-02-23 ENCOUNTER — Encounter: Payer: Self-pay | Admitting: Internal Medicine

## 2024-02-23 ENCOUNTER — Other Ambulatory Visit: Payer: Self-pay | Admitting: Internal Medicine

## 2024-02-23 ENCOUNTER — Ambulatory Visit
Admission: EM | Admit: 2024-02-23 | Discharge: 2024-02-23 | Disposition: A | Discharge status: Home / Self Care | Attending: Nurse Practitioner | Admitting: Nurse Practitioner

## 2024-02-23 DIAGNOSIS — U071 COVID-19: Secondary | ICD-10-CM

## 2024-02-23 LAB — POC COVID19/FLU A&B COMBO
Covid Antigen, POC: POSITIVE — AB
Influenza A Antigen, POC: NEGATIVE
Influenza B Antigen, POC: NEGATIVE

## 2024-02-23 MED ORDER — BENZONATATE 100 MG PO CAPS: 100.0000 mg | ORAL_CAPSULE | Freq: Three times a day (TID) | ORAL | 0 refills | Status: DC | PRN

## 2024-02-23 MED ORDER — NIRMATRELVIR/RITONAVIR (PAXLOVID) TABLET (RENAL DOSING): 2.0000 | ORAL_TABLET | Freq: Two times a day (BID) | ORAL | 0 refills | Status: AC

## 2024-02-23 NOTE — Telephone Encounter (Signed)
 Medication prior authorization request received.  Completed PA request through cover my meds for drug Testosterone. KEY: [id number]  Approved: Pending

## 2024-02-23 NOTE — ED Triage Notes (Signed)
 Pt reports lethargic, weak, headache, body aches, states fever this morning was 109.0, took tylenol and it dropped to 103.0

## 2024-02-23 NOTE — ED Provider Notes (Signed)
 [facility]    CSN: [id number] Arrival date & time: 02/23/24  0831      History   Chief Complaint No chief complaint on file.   HPI Elijah Shaffer is a 43 y.o. male.   Patient presents today with 1 day history of fever, body aches and chills, sore throat, headache, dizziness with position changes, nausea, diarrhea, decreased appetite, and fatigue.  He denies cough, shortness of breath or chest pain, runny or stuffy nose, ear pain, abdominal pain, or vomiting.  No known sick contacts.  Has taken Tylenol for the fever which did help.    Past Medical History:  Diagnosis Date   Acute renal injury (HCC) 06/28/2017   Anxiety    Aspiration pneumonia of right upper lobe due to gastric secretions (HCC)    Diabetes mellitus without complication (HCC)    Diverticulitis    GERD (gastroesophageal reflux disease) 01/08/2018   Hypertension    Prediabetes    Seasonal allergies    Sleep apnea    Syncope 06/26/2021    Patient Active Problem List   Diagnosis Date Noted   Type 2 diabetes mellitus with diabetic neuropathy, without long-term current use of insulin (HCC) 02/04/2024   Callus of foot 02/04/2024   Melena 10/08/2023   Type 2 diabetes mellitus with diabetic chronic kidney disease (HCC) 10/01/2023   Vitamin D deficiency 10/01/2023   Depression, recurrent (HCC) 10/01/2023   Abdominal distention 08/19/2023   Pectoralis muscle strain 08/06/2023   Hypercalcemia 08/06/2023   Chronic fatigue 08/06/2023   Chronic kidney disease, stage 3a (HCC) 07/10/2023   Excessive sweating 05/14/2023   Neuropathy 05/14/2023   Hypogonadism, male 03/24/2023   Essential hypertension 03/24/2023   Erythrocytosis 12/03/2022   Encounter for general adult medical examination with abnormal findings 12/02/2022   OSA (obstructive sleep apnea) 12/02/2022   Urinary retention 11/05/2022   Lumbar radiculopathy 09/26/2022   Cervical spinal stenosis 07/29/2022   DDD (degenerative disc disease),  lumbar 07/29/2022   NASH (nonalcoholic steatohepatitis) 06/10/2022   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/2022   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   PSVT (paroxysmal supraventricular tachycardia) (HCC) 03/25/2022   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   Allergic rhinitis 01/30/2022   Postural dizziness with presyncope 01/30/2022   Encounter for examination following treatment at hospital 12/31/2021   Atypical angina (HCC) 12/10/2021   Chronic sinusitis 12/04/2021   Mixed hyperlipidemia 11/30/2021   Esophageal hiatal hernia 11/22/2021   Irritable bowel syndrome with constipation 11/22/2021   Anxiety 06/26/2021   Eczema 06/26/2021   Barrett's esophagus 04/11/2021   History of colonic polyps 04/11/2021   Focal neurological deficit 02/23/2021   GERD (gastroesophageal reflux disease) 01/08/2018    Past Surgical History:  Procedure Laterality Date   BIOPSY  03/18/2021   Procedure: BIOPSY;  Surgeon: Urban Garden, MD;  [facility];  Service: Gastroenterology;;  esophageal at Z-line   BIOPSY  05/23/2021   Procedure: BIOPSY;  Surgeon: Urban Garden, MD;  [facility];  Service: Gastroenterology;;   BIOPSY  08/19/2023   Procedure: BIOPSY;  Surgeon: Urban Garden, MD;  [facility];  Service: Gastroenterology;;   CARDIOPULMONARY EXERCISE TEST (CPX)  01/09/2022   Interpretation limited by submaximal effort.  Severe functional impairment when compared to max sedentary norms.  No cardiopulmonary limitations.  Noted tremor and generalized weakness that lead to premature exercise termination-consider neuromuscular evaluation.   COLONOSCOPY N/A 09/22/2017   Procedure:  COLONOSCOPY;  Surgeon: Ruby Corporal, MD;  [facility];  Service: Endoscopy;  Laterality: N/A;  9:55   COLONOSCOPY WITH PROPOFOL N/A 05/23/2021   Procedure: COLONOSCOPY WITH PROPOFOL;  Surgeon:  Urban Garden, MD;  [facility];  Service: Gastroenterology;  Laterality: N/A;  7:30   ESOPHAGOGASTRODUODENOSCOPY (EGD) WITH PROPOFOL N/A 03/18/2021   Procedure: ESOPHAGOGASTRODUODENOSCOPY (EGD) WITH PROPOFOL;  Surgeon: Urban Garden, MD;  [facility];  Service: Gastroenterology;  Laterality: N/A;   ESOPHAGOGASTRODUODENOSCOPY (EGD) WITH PROPOFOL N/A 05/23/2021   Procedure: ESOPHAGOGASTRODUODENOSCOPY (EGD) WITH PROPOFOL;  Surgeon: Urban Garden, MD;  [facility];  Service: Gastroenterology;  Laterality: N/A;   ESOPHAGOGASTRODUODENOSCOPY (EGD) WITH PROPOFOL N/A 08/19/2023   Procedure: ESOPHAGOGASTRODUODENOSCOPY (EGD) WITH PROPOFOL;  Surgeon: Urban Garden, MD;  [facility];  Service: Gastroenterology;  Laterality: N/A;  11:45AM;ASA 3   ESOPHAGOGASTRODUODENOSCOPY (EGD) WITH PROPOFOL N/A 10/08/2023   Procedure: ESOPHAGOGASTRODUODENOSCOPY (EGD) WITH PROPOFOL;  Surgeon: Urban Garden, MD;  [facility];  Service: Gastroenterology;  Laterality: N/A;  1:45PM;ASA 1-2   GIVENS CAPSULE STUDY N/A 10/08/2023   Procedure: GIVENS CAPSULE STUDY;  Surgeon: Urban Garden, MD;  [facility];  Service: Gastroenterology;  Laterality: N/A;  1:45PM;ASA 1-2   POLYPECTOMY  09/22/2017   Procedure: POLYPECTOMY;  Surgeon: Ruby Corporal, MD;  [facility];  Service: Endoscopy;;   RIGHT/LEFT HEART CATH AND CORONARY ANGIOGRAPHY N/A 12/12/2021   Procedure: RIGHT/LEFT HEART CATH AND CORONARY ANGIOGRAPHY;  Surgeon: Mardell Shade, MD;  [facility];  Service: Cardiovascular:: Normal Coronaries & Hemodynamics.  EF 50-55%. RA = 4 mmHg, RV = 23/4; PA = 22/6 (14) & PCW = 7; Ao sat = 95%; PA sat = 76%, 76%& SVC sat = 74%Fick cardiac output/index = 7.1/3.0; PVR = 1.0 WU   TRANSTHORACIC ECHOCARDIOGRAM  02/24/2021   EF 60 to 65%.  Normal LV function.  Normal valves.   Mild RV dilation with normal function.   ZIO PATCH EVENT MONITOR  01/2022   Sinus rhythm with heart rate range 24 to 159 bpm, 1  AVB and Wenckebach block noted along with rare PACs and PVCs.  No arrhythmias.       Home Medications    Prior to Admission medications   Medication Sig Start Date End Date Taking? Authorizing Provider  benzonatate (TESSALON) 100 MG capsule Take 1 capsule (100 mg total) by mouth 3 (three) times daily as needed for cough. Do not take with alcohol or while operating or driving heavy machinery 1/61/09  Yes Thena Fireman A, NP  amLODipine (NORVASC) 5 MG tablet Take 1 tablet by mouth once daily 12/03/23   Patel, Rutwik K, MD  b complex vitamins capsule Take 1 capsule by mouth daily. With D3 gummie    [provider]  Blood Glucose Monitoring Suppl DEVI 1 each by Does not apply route in the morning, at noon, and at bedtime. May substitute to any manufacturer covered by patient's insurance. 10/13/23   Patel, Rutwik K, MD  capsicum (ZOSTRIX) 0.075 % topical cream Apply 1 Application topically 4 (four) times daily. 01/28/24   Leath-Warren, Belen Bowers, NP  cetirizine (ZYRTEC) 10 MG tablet Take 10 mg by mouth daily.    [provider]  Cholecalciferol (VITAMIN D-3 PO) Take by mouth daily at 6 (six) AM.    [provider]  clindamycin (CLEOCIN T) 1 % external solution Apply topically to acne bumps at  aa's qd 06/17/22   Willeen Niece, MD  clobetasol (TEMOVATE) 0.05 % external solution Apply 1 application. topically 2 (two) times daily. Use as directed.  Mix with cerave cream. Use for up to 4 weeks. Avoid applying to face, groin, and axilla. Use as directed. 03/21/22   Willeen Niece, MD  Continuous Glucose Receiver (DEXCOM G6 RECEIVER) DEVI Use to check blood sugar daily DX e11.65 10/23/23   Anabel Halon, MD  Continuous Glucose Sensor (DEXCOM G6 SENSOR) MISC Use to check blood sugar daily DX e11.65 10/23/23   Anabel Halon, MD  Continuous Glucose  Transmitter (DEXCOM G6 TRANSMITTER) MISC Use to check blood sugar daily DX e11.65 10/23/23   Anabel Halon, MD  cyclobenzaprine (FLEXERIL) 5 MG tablet Take 1 tablet (5 mg total) by mouth at bedtime as needed for muscle spasms. 08/06/23   Anabel Halon, MD  diphenhydrAMINE-zinc acetate (BENADRYL EXTRA STRENGTH) cream Apply 1 application topically 3 (three) times daily as needed for itching. 12/07/21   Paseda, Baird Kay, FNP  docusate sodium (COLACE) 100 MG capsule Take 100 mg by mouth 2 (two) times daily.    [provider]  DULoxetine (CYMBALTA) 30 MG capsule Take 30 mg by mouth 2 (two) times daily. 01/05/24   [provider]  fenofibrate (TRICOR) 145 MG tablet Take 1 tablet (145 mg total) by mouth daily. Please call [phone] to schedule an overdue appointment for future refills. Thank you. FINAL ATTEMPT. 01/27/24   Marykay Lex, MD  fluocinonide (LIDEX) 0.05 % external solution Apply once or twice daily to affected areas on scalp up to 2 weeks as needed for itching. Avoid applying to face, groin, and axilla. 06/11/22   Willeen Niece, MD  fluticasone [facility] ALLERGY RELIEF) 50 MCG/ACT nasal spray Place 2 sprays into both nostrils daily as needed for allergies or rhinitis. 11/14/23   Anabel Halon, MD  gabapentin (NEURONTIN) 300 MG capsule Take 300 mg by mouth 3 (three) times daily.    [provider]  Glucose Blood (BLOOD GLUCOSE TEST STRIPS) STRP 1 each by In Vitro route in the morning, at noon, and at bedtime. May substitute to any manufacturer covered by patient's insurance. 10/13/23   Anabel Halon, MD  hydrOXYzine (VISTARIL) 25 MG capsule TAKE 1 TO 2 CAPSULES BY MOUTH ONCE DAILY BEFORE  BEDTIME  AS  NEEDED  FOR  ITCHING.  CAN  CAUSE  DROWSINESS 02/09/24   Willeen Niece, MD  JARDIANCE 10 MG TABS tablet TAKE 1 TABLET BY MOUTH ONCE DAILY BEFORE BREAKFAST 01/26/24   Anabel Halon, MD  ketoconazole (NIZORAL) 2 % shampoo Apply 1 Application topically daily as needed  for irritation. 06/11/22   Willeen Niece, MD  lubiprostone (AMITIZA) 24 MCG capsule TAKE 1 CAPSULE BY MOUTH TWICE DAILY WITH A MEAL 12/29/23   Carlan, Chelsea L, NP  montelukast (SINGULAIR) 10 MG tablet TAKE 1 TABLET BY MOUTH AT BEDTIME 08/12/23   Martina Sinner, MD  olmesartan (BENICAR) 5 MG tablet Take 5 mg by mouth daily. 12/17/23   [provider]  omeprazole (PRILOSEC) 40 MG capsule Take 1 capsule by mouth twice daily 12/15/23   Marguerita Merles, Reuel Boom, MD  ondansetron (ZOFRAN) 4 MG tablet Take 1 tablet (4 mg total) by mouth every 6 (six) hours as needed for nausea. 06/04/21   Bing Neighbors, NP  propranolol (INDERAL) 20 MG tablet Take one tablet 20 mg  twice daily  may take an  additional dose of 20 mg  as needed for palpitations 03/25/22   Arleen Lacer, MD  rosuvastatin (CRESTOR) 40 MG tablet Take 1 tablet (40 mg total) by mouth daily. 01/15/24   Arleen Lacer, MD  Testosterone 25 MG/2.5GM (1%) GEL Place 5 g onto the skin in the morning. Apply 1 packet to each shoulder qam 11/13/23   Wrenn, John, MD  triamcinolone cream (KENALOG) 0.1 %  03/07/22   [provider]  Vitamin D, Ergocalciferol, (DRISDOL) 1.25 MG (50000 UNIT) CAPS capsule Take 1 capsule (50,000 Units total) by mouth every 7 (seven) days. 10/01/23   Zarwolo, Gloria, FNP    Family History Family History  Problem Relation Age of Onset   Healthy Mother    Cervical cancer Mother    Cancer Father    Colon cancer Maternal Grandmother    Colon cancer Maternal Grandfather     Social History Social History   Tobacco Use   Smoking status: Never    Passive exposure: Past   Smokeless tobacco: Never  Vaping Use   Vaping status: Never Used  Substance Use Topics   Alcohol use: Not Currently    Comment: occasionally   Drug use: No     Allergies   Gramineae pollens, Claritin [loratadine], Loratadine-pseudoephedrine er, and Pollen extract   Review of Systems Review of Systems Per HPI  Physical  Exam Triage Vital Signs ED Triage Vitals  Encounter Vitals Group     BP 02/23/24 0946 123/79     Systolic BP Percentile --      Diastolic BP Percentile --      Pulse Rate 02/23/24 0946 94     Resp 02/23/24 0946 (!) 24     Temp 02/23/24 0946 99 F (37.2 C)     Temp Source 02/23/24 0946 Oral     SpO2 02/23/24 0946 94 %     Weight --      Height --      Head Circumference --      Peak Flow --      Pain Score 02/23/24 0951 6     Pain Loc --      Pain Education --      Exclude from Growth Chart --    No data found.  Updated Vital Signs BP 123/79 (BP Location: Right Arm)   Pulse 94   Temp 99 F (37.2 C) (Oral)   Resp (!) 24   SpO2 94%   Visual Acuity Right Eye Distance:   Left Eye Distance:   Bilateral Distance:    Right Eye Near:   Left Eye Near:    Bilateral Near:     Physical Exam Vitals and nursing note reviewed.  Constitutional:      General: He is not in acute distress.    Appearance: Normal appearance. He is not ill-appearing or toxic-appearing.  HENT:     Head: Normocephalic and atraumatic.     Right Ear: Tympanic membrane, ear canal and external ear normal.     Left Ear: Tympanic membrane, ear canal and external ear normal.     Nose: No congestion or rhinorrhea.     Mouth/Throat:     Mouth: Mucous membranes are moist.     Pharynx: Oropharynx is clear. No oropharyngeal exudate or posterior oropharyngeal erythema.  Eyes:     General: No scleral icterus.    Extraocular Movements: Extraocular movements intact.  Cardiovascular:     Rate and Rhythm: Normal rate and regular rhythm.  Pulmonary:     Effort:  Pulmonary effort is normal. No respiratory distress.     Breath sounds: Normal breath sounds. No wheezing, rhonchi or rales.  Musculoskeletal:     Cervical back: Normal range of motion and neck supple.  Lymphadenopathy:     Cervical: No cervical adenopathy.  Skin:    General: Skin is warm and dry.     Coloration: Skin is not jaundiced or pale.      Findings: No erythema or rash.  Neurological:     Mental Status: He is alert and oriented to person, place, and time.  Psychiatric:        Behavior: Behavior is cooperative.      UC Treatments / Results  Labs (all labs ordered are listed, but only abnormal results are displayed) Labs Reviewed  POC COVID19/FLU A&B COMBO - Abnormal; Notable for the following components:      Result Value   Covid Antigen, POC Positive (*)    All other components within normal limits    EKG   Radiology No results found.  Procedures Procedures (including critical care time)  Medications Ordered in UC Medications - No data to display  Initial Impression / Assessment and Plan / UC Course  I have reviewed the triage vital signs and the nursing notes.  Pertinent labs & imaging results that were available during my care of the patient were reviewed by me and considered in my medical decision making (see chart for details).   In triage, vital signs are stable.  Patient is mildly tachypneic which is likely from the fever.  1. COVID-19 Vitals and exam are reassuring today Not a candidate for Paxlovid given medication interactions Supportive care discussed with patient; isolation criteria discussed Start cough suppressant medication as needed and other supportive care discussed Strict ER and return precautions discussed  The patient was given the opportunity to ask questions.  All questions answered to their satisfaction.  The patient is in agreement to this plan.   Final Clinical Impressions(s) / UC Diagnoses   Final diagnoses:  COVID-19     Discharge Instructions      You tested positive for COVID-19 today.  Symptoms should improve over the next week to 10 days.  If you develop chest pain or shortness of breath, go to the emergency room.  Isolate until you are fever free for 24 hours without fever reducing medication  Some things that can make you feel better are: - Increased rest -  Increasing fluid with water/sugar free electrolytes - Acetaminophen and ibuprofen as needed for fever/pain - Salt water gargling, chloraseptic spray and throat lozenges - OTC guaifenesin (Mucinex) 600 mg twice daily - Saline sinus flushes or a neti pot - Humidifying the air - Coricidin OTC as needed -Tessalon Perles every 8 hours as needed for dry cough      ED Prescriptions     Medication Sig Dispense Auth. Provider   benzonatate (TESSALON) 100 MG capsule Take 1 capsule (100 mg total) by mouth 3 (three) times daily as needed for cough. Do not take with alcohol or while operating or driving heavy machinery 21 capsule Wilhemena Harbour, NP      PDMP not reviewed this encounter.   Wilhemena Harbour, NP 02/23/24 1209

## 2024-02-23 NOTE — Telephone Encounter (Signed)
 Patients PCP office called and LM stating patient is upset as he wants DM shoes and that they need to sign off on his ppwk. After reviewing his INS patient has Raina Bunting and does not need ppw from treating signed off as this is only for Medicare and Medicare Advantage INS that requires Treating Dr to sign off However [employer] may require Prior auth.   Patient was here on 4/3 saw Dr Lydia Sams for [facility] and order has been placed for those I called patient and I did explain if he wants shoes then Dr. Lydia Sams will need to give us  an order for them ( our Dr Lydia Sams ) and when he comes to PU his custom foot orthotics then maybe we can pick out his shoes then

## 2024-02-23 NOTE — Discharge Instructions (Signed)
 You tested positive for COVID-19 today.  Symptoms should improve over the next week to 10 days.  If you develop chest pain or shortness of breath, go to the emergency room.  Isolate until you are fever free for 24 hours without fever reducing medication  Some things that can make you feel better are: - Increased rest - Increasing fluid with water/sugar free electrolytes - Acetaminophen and ibuprofen as needed for fever/pain - Salt water gargling, chloraseptic spray and throat lozenges - OTC guaifenesin (Mucinex) 600 mg twice daily - Saline sinus flushes or a neti pot - Humidifying the air - Coricidin OTC as needed -Tessalon Perles every 8 hours as needed for dry cough

## 2024-02-24 ENCOUNTER — Telehealth: Payer: Self-pay | Admitting: Urology

## 2024-02-24 ENCOUNTER — Other Ambulatory Visit: Payer: Self-pay | Admitting: Urology

## 2024-02-24 ENCOUNTER — Ambulatory Visit: Payer: Self-pay | Admitting: Internal Medicine

## 2024-02-24 ENCOUNTER — Ambulatory Visit: Payer: Self-pay

## 2024-02-24 MED ORDER — TESTOSTERONE 25 MG/2.5GM (1%) TD GEL: 2.0000 | Freq: Every morning | TRANSDERMAL | 5 refills | Status: DC

## 2024-02-24 NOTE — Telephone Encounter (Signed)
 Copied From CRM [id number]. Reason for Triage: pt has covid, says feels hot and sweaty, just not feeling well. Temp 101.4.  Chief Complaint: COVID Symptoms: Fever, cough, headache, fatigue Frequency: Since Monday Pertinent Negatives: Patient denies SOB Disposition: [] ED /[] Urgent Care (no appt availability in office) / [] Appointment(In office/virtual)/ []  [facility]/ [x] Home Care/ [] Refused Recommended Disposition /[] [place] [facility]/ []  Follow-up with PCP Additional Notes: Patient called in to report a fever of 101.4. Patient stated he tested positive for COVID on Monday. Per chart, patient's PCP is aware and has prescribed Paxlovid. Patient is also experiencing fatigue, cough, headache and sweating. Patient denied SOB. Patient asked this RN if further medical attention was needed. This RN advised patient to take Tylenol for fever and monitor symptoms at home. Provided care advice and instructed patient to call back if symptoms worsen. Patient complied.    Reason for Disposition  [1] COVID-19 diagnosed by doctor (or NP/PA) AND [2] mild symptoms (e.g., cough, fever, others) AND [3] no complications or SOB  Answer Assessment - Initial Assessment Questions 1. COVID-19 DIAGNOSIS: "How do you know that you have COVID?" (e.g., positive lab test or self-test, diagnosed by doctor or NP/PA, symptoms after exposure).     Monday at an UC 3. ONSET: "When did the COVID-19 symptoms start?"      Monday 4. WORST SYMPTOM: "What is your worst symptom?" (e.g., cough, fever, shortness of breath, muscle aches)     Feeling hot from fever  5. COUGH: "Do you have a cough?" If Yes, ask: "How bad is the cough?"       Clear mucous 6. FEVER: "Do you have a fever?" If Yes, ask: "What is your temperature, how was it measured, and when did it start?"     Fever 101.4 7. RESPIRATORY STATUS: "Describe your breathing?" (e.g., normal; shortness of breath, wheezing, unable to speak)      Denies difficulty  breathing 8. BETTER-SAME-WORSE: "Are you getting better, staying the same or getting worse compared to yesterday?"  If getting worse, ask, "In what way?"     States he was getting better, but fever started and he is feeling worse 9. OTHER SYMPTOMS: "Do you have any other symptoms?"  (e.g., chills, fatigue, headache, loss of smell or taste, muscle pain, sore throat)     Fatigue, slight headache 10. HIGH RISK DISEASE: "Do you have any chronic medical problems?" (e.g., asthma, heart or lung disease, weak immune system, obesity, etc.)       Diabetes 13. O2 SATURATION MONITOR:  "Do you use an oxygen saturation monitor (pulse oximeter) at home?" If Yes, ask "What is your reading (oxygen level) today?" "What is your usual oxygen saturation reading?" (e.g., 95%)       N/A  Protocols used: Coronavirus (COVID-19) Diagnosed or Suspected-A-AH

## 2024-02-24 NOTE — Telephone Encounter (Signed)
 FYI insurance did not approve Rx can you submit new Rx that fits formulary for insurance CC chart message sent prior

## 2024-02-24 NOTE — Telephone Encounter (Signed)
 Informed pt of this verbalized understanding.

## 2024-02-24 NOTE — Telephone Encounter (Addendum)
 Copied from CRM [id number]. Topic: Clinical - Red Word Triage >> Feb 24, 2024  9:32 AM Carlatta H wrote: [facility] that prompted transfer to Nurse Triage: patient was returning a call from the office and he just sounds bad and has me worried//He is not responding or responding very slow//  02/24/24- Pt was returning call from office, he wanted clarity on message from Dr. Lydia Sams regarding the Paxlovid, info provided from providers message. Pt states he will have brother pick up medication. Pt reports hot flashes as well, states he's been taking Tylenol to help withtemperatures. RN advised to call back if symptoms worsen or if he has any question

## 2024-02-24 NOTE — Telephone Encounter (Signed)
Tried calling pt back unable to reach pt ----

## 2024-02-24 NOTE — Telephone Encounter (Signed)
 Patient called his testosterone gel needs to have prior authorization,  he is out can you work on that for him?

## 2024-02-25 ENCOUNTER — Telehealth: Payer: Self-pay

## 2024-02-25 NOTE — Telephone Encounter (Signed)
-----   Message from Homero Luster sent at 02/24/2024  4:53 PM EDT ----- His order in the chart is for the 25mg /2.5gm 1% cream that he is to apply 2 packs daily, 1 to each shoulder.   That is one of his formulary drugs.   I resent it with a slight edit.   We can see if that will work. ----- Message ----- From: Dyke Glasser, CMA Sent: 02/24/2024   3:50 PM EDT To: Homero Luster, MD  FYI insurance denied Rx could you possibly prescribe a new Rx that meet formulary requirements

## 2024-02-25 NOTE — Telephone Encounter (Signed)
 I got the new Rx I double charted my apologies

## 2024-02-25 NOTE — Telephone Encounter (Signed)
 Noted, patient  understood recommendations

## 2024-02-25 NOTE — Telephone Encounter (Signed)
 Provider changed Rx to to accomodates the insurance formulary list for approval Pt made aware in detaile vm

## 2024-02-27 NOTE — Telephone Encounter (Signed)
 Patient left message RX is still not ready

## 2024-02-27 NOTE — Telephone Encounter (Signed)
 Called pt and lvm stating that we sent the Rx on 04/15 again to [facility] pharmacy and if it isn't the correct pharmacy to c/b so it can be changed

## 2024-03-01 ENCOUNTER — Ambulatory Visit (INDEPENDENT_AMBULATORY_CARE_PROVIDER_SITE_OTHER): Payer: 59 | Admitting: Gastroenterology

## 2024-03-04 ENCOUNTER — Other Ambulatory Visit: Payer: Self-pay | Admitting: Internal Medicine

## 2024-03-04 ENCOUNTER — Other Ambulatory Visit (INDEPENDENT_AMBULATORY_CARE_PROVIDER_SITE_OTHER): Payer: Self-pay | Admitting: Gastroenterology

## 2024-03-04 ENCOUNTER — Other Ambulatory Visit: Payer: Self-pay | Admitting: Urology

## 2024-03-04 DIAGNOSIS — K219 Gastro-esophageal reflux disease without esophagitis: Secondary | ICD-10-CM

## 2024-03-04 DIAGNOSIS — I10 Essential (primary) hypertension: Secondary | ICD-10-CM

## 2024-03-04 MED ORDER — TESTOSTERONE 25 MG/2.5GM (1%) TD GEL: 2.0000 | Freq: Every morning | TRANSDERMAL | Status: DC

## 2024-03-04 NOTE — Telephone Encounter (Signed)
 Patient left message he went to pick up RX and it was over $100. He states insurance normally pays for it. Please call patient and advise.

## 2024-03-04 NOTE — Telephone Encounter (Signed)
 Patient called and made aware. Patient state's he would like his previous Rx Testosterone  Androgel  50 mg/5gm 1% gel sent to [facility] "I have tried to find a formulary alternative but I can't really tell what is going to be paid for or not.   The one I tried to send said preferred in one place and not reimbursable in another.   He would need to check with the pharmacy or his insurer to find out which one is going to be the best for him.   I don't know what more to do from my end.      If he can't find a cheaper option with his insurance, we could consider a compounded testosterone  from [facility] that might be cheaper. "

## 2024-03-04 NOTE — Telephone Encounter (Signed)
 Called patient and he state's that the pharmacy has a new testosterone  packet in and it's not what he was taking previously and the testosterone  that Dr. Wrenn prescribe recently is cheaper. Made patient aware that this could be his co-payment for the new RX.  Patient state's he has been out of testosterone  for about 2 weeks now. Patient is aware a message will be sent to Dr.Wrenn on other options that could be more affordable. Patient verbalized understanding.

## 2024-03-05 ENCOUNTER — Other Ambulatory Visit: Payer: Self-pay | Admitting: Urology

## 2024-03-05 MED ORDER — TESTOSTERONE 50 MG/5GM (1%) TD GEL: 5.0000 g | Freq: Every day | TRANSDERMAL | 5 refills | Status: DC

## 2024-03-05 NOTE — Telephone Encounter (Signed)
 Patient is made aware via detailed voiced mailed "I have sent the med and this one is twice the strength so he would just use one packet daily."

## 2024-03-05 NOTE — Progress Notes (Signed)
 50mg Elijah Shaffer packets sent.   He should use one packet daily.

## 2024-03-08 ENCOUNTER — Telehealth: Payer: Self-pay

## 2024-03-08 ENCOUNTER — Other Ambulatory Visit: Payer: Self-pay | Admitting: Dermatology

## 2024-03-08 DIAGNOSIS — L2089 Other atopic dermatitis: Secondary | ICD-10-CM

## 2024-03-08 NOTE — Telephone Encounter (Signed)
 Medication prior authorization request received.  Completed PA request through cover my meds for drug Testosterone  50 MG. KEY: [id number]  Approved: Pending

## 2024-03-11 ENCOUNTER — Ambulatory Visit: Admitting: Podiatry

## 2024-03-17 ENCOUNTER — Encounter (HOSPITAL_COMMUNITY): Payer: Self-pay

## 2024-03-17 ENCOUNTER — Ambulatory Visit: Admitting: Dermatology

## 2024-03-17 DIAGNOSIS — I781 Nevus, non-neoplastic: Secondary | ICD-10-CM

## 2024-03-17 DIAGNOSIS — L739 Follicular disorder, unspecified: Secondary | ICD-10-CM

## 2024-03-17 DIAGNOSIS — L219 Seborrheic dermatitis, unspecified: Secondary | ICD-10-CM

## 2024-03-17 DIAGNOSIS — L2081 Atopic neurodermatitis: Secondary | ICD-10-CM

## 2024-03-17 DIAGNOSIS — L281 Prurigo nodularis: Secondary | ICD-10-CM

## 2024-03-17 DIAGNOSIS — L209 Atopic dermatitis, unspecified: Secondary | ICD-10-CM

## 2024-03-17 DIAGNOSIS — I8393 Asymptomatic varicose veins of bilateral lower extremities: Secondary | ICD-10-CM

## 2024-03-17 MED ORDER — CLINDAMYCIN PHOSPHATE 1 % EX LOTN: TOPICAL_LOTION | CUTANEOUS | 3 refills | Status: DC

## 2024-03-17 MED ORDER — HYDROXYZINE HCL 25 MG PO TABS: ORAL_TABLET | ORAL | 5 refills | Status: DC

## 2024-03-17 MED ORDER — CLOBETASOL PROPIONATE 0.05 % EX CREA: TOPICAL_CREAM | CUTANEOUS | 3 refills | Status: DC

## 2024-03-17 MED ORDER — KETOCONAZOLE 2 % EX SHAM: MEDICATED_SHAMPOO | CUTANEOUS | 11 refills | Status: AC

## 2024-03-17 NOTE — Progress Notes (Signed)
   Follow-Up Visit   Subjective  Elijah Shaffer is a 43 y.o. male who presents for the following: Atopic Dermatitis 6 month follow-up. He is using clobetasol  cream to area on left lower leg. He takes hydroxyzine  25 mg at bedtime, and uses ketoconazole  2% shampoo. Rash hasn't worsened since off Rinvoq .  He also has pink bumps on the abdomen and back, not itchy.     The following portions of the chart were reviewed this encounter and updated as appropriate: medications, allergies, medical history  Review of Systems:  No other skin or systemic complaints except as noted in HPI or Assessment and Plan.  Objective  Well appearing patient in no apparent distress; mood and affect are within normal limits.  Areas Examined: Face, trunk, extremities  Relevant physical exam findings are noted in the Assessment and Plan.    Assessment & Plan   SEBORRHEIC DERMATITIS   Related Medications ketoconazole  (NIZORAL ) 2 % shampoo Massage into scalp 2-3 times a week, let sit several minutes before rinsing.  FOLLICULITIS Exam: Small follicular pink papules on the abdomen and back  Chronic and persistent condition with duration or expected duration over one year. Condition is bothersome/symptomatic for patient. Currently flared.   Folliculitis occurs due to inflammation of the superficial hair follicle (pore), resulting in acne-like lesions (pus bumps). It can be infectious (bacterial, fungal) or noninfectious (shaving, tight clothing, heat/sweat, medications).  Folliculitis can be acute or chronic and recommended treatment depends on the underlying cause of folliculitis.  Treatment Plan: Start Clindamycin  lotion to affected areas once daily after shower.  Recommend OTC benzoyl peroxide cleanser, wash affected areas daily in shower, let sit several minutes prior to rinsing.  May bleach towels if not rinsed off completely.  Recommended brands include Panoxyl 4% Creamy Wash, CeraVe Acne Foaming Cream  wash, or Cetaphil Gentle Clear Complexion-Clearing BPO Acne Cleanser.    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 Currently well-controlled.   Atopic dermatitis (eczema) is a chronic, relapsing, pruritic condition that can significantly affect quality of life. It is often associated with allergic rhinitis and/or asthma and can require treatment with topical medications, phototherapy, or in severe cases biologic injectable medication (Dupixent ; Adbry) or Oral JAK inhibitors.  Treatment Plan: Continue clobetasol  cream spot treat once to twice daily until improved Continue hydroxyzine  25 mg 1 po at bedtime as needed for itch. Continue ketoconazole  2% shampoo to scalp, let sit several minutes before rinsing.   Recommend gentle skin care.  Varicose Veins/Spider Veins - Dilated blue, purple or red veins at the lower extremities - Reassured - Smaller vessels can be treated by sclerotherapy (a procedure to inject a medicine into the veins to make them disappear) if desired, but the treatment is not covered by insurance. Larger vessels may be covered if symptomatic and we would refer to vascular surgeon if treatment desired.  Return in about 6 months (around 09/17/2024) for Atopic Dermatitis.  IBernardine Bridegroom, CMA, am acting as scribe for Artemio Larry, MD .   Documentation: I have reviewed the above documentation for accuracy and completeness, and I agree with the above.  Artemio Larry, MD

## 2024-03-17 NOTE — Patient Instructions (Addendum)
 Recommend OTC benzoyl peroxide cleanser, wash affected areas daily in shower, let sit several minutes prior to rinsing.  May bleach towels if not rinsed off completely.  Recommended brands include Panoxyl 4% Creamy Wash, CeraVe Acne Foaming Cream wash, or Cetaphil Gentle Clear Complexion-Clearing BPO Acne Cleanser.  Folliculitis occurs due to inflammation of the superficial hair follicle (pore), resulting in acne-like lesions (pus bumps). It can be infectious (bacterial, fungal) or noninfectious (shaving, tight clothing, heat/sweat, medications).  Folliculitis can be acute or chronic and recommended treatment depends on the underlying cause of folliculitis.  Due to recent changes in healthcare laws, you may see results of your pathology and/or laboratory studies on MyChart before the doctors have had a chance to review them. We understand that in some cases there may be results that are confusing or concerning to you. Please understand that not all results are received at the same time and often the doctors may need to interpret multiple results in order to provide you with the best plan of care or course of treatment. Therefore, we ask that you please give us  2 business days to thoroughly review all your results before contacting the office for clarification. Should we see a critical lab result, you will be contacted sooner.   If You Need Anything After Your Visit  If you have any questions or concerns for your doctor, please call our main line at [phone] and press option 4 to reach your doctor's medical assistant. If no one answers, please leave a voicemail as directed and we will return your call as soon as possible. Messages left after 4 pm will be answered the following business day.   You may also send us  a message via MyChart. We typically respond to MyChart messages within 1-2 business days.  For prescription refills, please ask your pharmacy to contact our office. Our fax number is  [phone].  If you have an urgent issue when the clinic is closed that cannot wait until the next business day, you can page your doctor at the number below.    Please note that while we do our best to be available for urgent issues outside of office hours, we are not available 24/7.   If you have an urgent issue and are unable to reach us , you may choose to seek medical care at your doctor's office, retail clinic, urgent care center, or emergency room.  If you have a medical emergency, please immediately call 911 or go to the emergency department.  Pager Numbers  - Dr. Bary Likes: [phone]  - Dr. Annette Barters: [phone]  - Dr. Felipe Horton: [phone]   In the event of inclement weather, please call our main line at [phone] for an update on the status of any delays or closures.  Dermatology Medication Tips: Please keep the boxes that topical medications come in in order to help keep track of the instructions about where and how to use these. Pharmacies typically print the medication instructions only on the boxes and not directly on the medication tubes.   If your medication is too expensive, please contact our office at [phone] option 4 or send us  a message through MyChart.   We are unable to tell what your co-pay for medications will be in advance as this is different depending on your insurance coverage. However, we may be able to find a substitute medication at lower cost or fill out paperwork to get insurance to cover a needed medication.   If a prior authorization is required to  get your medication covered by your insurance company, please allow us  1-2 business days to complete this process.  Drug prices often vary depending on where the prescription is filled and some pharmacies may offer cheaper prices.  The website www.goodrx.com contains coupons for medications through different pharmacies. The prices here do not account for what the cost may be with help from  insurance (it may be cheaper with your insurance), but the website can give you the price if you did not use any insurance.  - You can print the associated coupon and take it with your prescription to the pharmacy.  - You may also stop by our office during regular business hours and pick up a GoodRx coupon card.  - If you need your prescription sent electronically to a different pharmacy, notify our office through [facility] or by phone at [phone] option 4.     Si Usted Necesita Algo Despus de Su Visita  Tambin puede enviarnos un mensaje a travs de Clinical cytogeneticist. Por lo general respondemos a los mensajes de MyChart en el transcurso de 1 a 2 das hbiles.  Para renovar recetas, por favor pida a su farmacia que se ponga en contacto con nuestra oficina. Franz Jacks de fax es [place] [phone].  Si tiene un asunto urgente cuando la clnica est cerrada y que no puede esperar hasta el siguiente da hbil, puede llamar/localizar a su doctor(a) al nmero que aparece a continuacin.   Por favor, tenga en cuenta que aunque hacemos todo lo posible para estar disponibles para asuntos urgentes fuera del horario de [place], no estamos disponibles las 24 horas del da, los 7 [address]  [address] de la [place].   Si tiene un problema urgente y no puede comunicarse con nosotros, puede optar por buscar atencin mdica  en el consultorio de su doctor(a), en una clnica privada, en un centro de atencin urgente o en una sala de emergencias.  Si tiene Engineer, drilling, por favor llame inmediatamente al 911 o vaya a la sala de emergencias.  Nmeros de bper  - Dr. Bary Likes: [phone]  - Dra. Annette Barters: [phone]  - Dr. Felipe Horton: [phone]   En caso de inclemencias del tiempo, por favor llame a Lajuan Pila principal al [phone] para una actualizacin sobre el [place] de cualquier retraso o cierre.  Consejos para la medicacin en dermatologa: Por favor, guarde las cajas en las que vienen los  medicamentos de uso tpico para ayudarle a seguir las instrucciones sobre dnde y cmo usarlos. Las farmacias generalmente imprimen las instrucciones del medicamento slo en las cajas y no directamente en los tubos del [place].   Si su medicamento es muy caro, por favor, pngase en contacto con Bettyjane Brunet llamando al [phone] y presione la opcin 4 o envenos un mensaje a travs de Clinical cytogeneticist.   No podemos decirle cul ser su copago por los medicamentos por adelantado ya que esto es diferente dependiendo de la cobertura de su seguro. Sin embargo, es posible que podamos encontrar un medicamento sustituto a Audiological scientist un formulario para que el seguro cubra el medicamento que se considera necesario.   Si se requiere una autorizacin previa para que su compaa de seguros [place] su medicamento, por favor permtanos de 1 a 2 das hbiles para completar este proceso.  Los precios de los medicamentos varan con frecuencia dependiendo del Environmental consultant de dnde se surte la receta y alguna farmacias pueden ofrecer precios ms baratos.  El sitio web www.goodrx.com tiene cupones para medicamentos de [facility]  farmacias. Los precios aqu no tienen en cuenta lo que podra costar con la ayuda del seguro (puede ser ms barato con su seguro), pero el sitio web puede darle el precio si no utiliz Tourist information centre manager.  - Puede imprimir el cupn correspondiente y llevarlo con su receta a la farmacia.  - Tambin puede pasar por nuestra oficina durante el horario de atencin regular y Education officer, museum una tarjeta de cupones de GoodRx.  - Si necesita que su receta se enve electrnicamente a una farmacia diferente, informe a nuestra oficina a travs de MyChart de [place] o por telfono llamando al [phone] y presione la opcin 4.

## 2024-03-18 ENCOUNTER — Ambulatory Visit: Admitting: Podiatry

## 2024-03-19 ENCOUNTER — Telehealth: Payer: Self-pay

## 2024-03-19 NOTE — Telephone Encounter (Signed)
 LVM to schedule orthotic PU

## 2024-03-23 ENCOUNTER — Encounter: Payer: Self-pay | Admitting: Internal Medicine

## 2024-03-23 ENCOUNTER — Ambulatory Visit
Admission: EM | Admit: 2024-03-23 | Discharge: 2024-03-23 | Disposition: A | Discharge status: Home / Self Care | Attending: Nurse Practitioner | Admitting: Nurse Practitioner

## 2024-03-23 DIAGNOSIS — X58XXXA Exposure to other specified factors, initial encounter: Secondary | ICD-10-CM

## 2024-03-23 DIAGNOSIS — L299 Pruritus, unspecified: Secondary | ICD-10-CM

## 2024-03-23 MED ORDER — METHYLPREDNISOLONE ACETATE 40 MG/ML IJ SUSP
40.0000 mg | Freq: Once | INTRAMUSCULAR | Status: AC
  Administered 2024-03-23: 40 mg via INTRAMUSCULAR

## 2024-03-23 NOTE — ED Triage Notes (Signed)
 Pt reports he has poison ivy on his arms, lips, and eyes x 2 days

## 2024-03-23 NOTE — Discharge Instructions (Signed)
 We have given you an injection of steroid medicine today to help with the itching.  Recommend starting the oral and histamine twice daily to help with the itching as well.  Seek care if symptoms persist/worsen despite treatment.

## 2024-03-23 NOTE — ED Provider Notes (Signed)
 [facility]    CSN: [id number] Arrival date & time: 03/23/24  0904      History   Chief Complaint No chief complaint on file.   HPI Elijah Shaffer is a 43 y.o. male.   Patient presents today with concern for exposure to poison oak.  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  He denies any rash at this time, shortness of breath, or throat/tongue swelling.  Reports frequent exposures to poison oak/ivy and steroid injections typically help him the most.  He takes oral loratadine daily for allergies.  No recent change in detergents, soaps, or personal care products.    Past Medical History:  Diagnosis Date   Acute renal injury (HCC) 06/28/2017   Anxiety    Aspiration pneumonia of right upper lobe due to gastric secretions (HCC)    Diabetes mellitus without complication (HCC)    Diverticulitis    GERD (gastroesophageal reflux disease) 01/08/2018   Hypertension    Prediabetes    Seasonal allergies    Sleep apnea    Syncope 06/26/2021    Patient Active Problem List   Diagnosis Date Noted   Type 2 diabetes mellitus with diabetic neuropathy, without long-term current use of insulin (HCC) 02/04/2024   Callus of foot 02/04/2024   Melena 10/08/2023   Type 2 diabetes mellitus with diabetic chronic kidney disease (HCC) 10/01/2023   Vitamin D  deficiency 10/01/2023   Depression, recurrent (HCC) 10/01/2023   Abdominal distention 08/19/2023   Pectoralis muscle strain 08/06/2023   Hypercalcemia 08/06/2023   Chronic fatigue 08/06/2023   Chronic kidney disease, stage 3a (HCC) 07/10/2023   Excessive sweating 05/14/2023   Neuropathy 05/14/2023   Hypogonadism, male 03/24/2023   Essential hypertension 03/24/2023   Erythrocytosis 12/03/2022   Encounter for general adult medical examination with abnormal findings 12/02/2022   OSA (obstructive sleep apnea) 12/02/2022   Urinary  retention 11/05/2022   Lumbar radiculopathy 09/26/2022   Cervical spinal stenosis 07/29/2022   DDD (degenerative disc disease), lumbar 07/29/2022   NASH (nonalcoholic steatohepatitis) 06/10/2022   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/2022   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   PSVT (paroxysmal supraventricular tachycardia) (HCC) 03/25/2022   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   Allergic rhinitis 01/30/2022   Postural dizziness with presyncope 01/30/2022   Encounter for examination following treatment at hospital 12/31/2021   Atypical angina (HCC) 12/10/2021   Chronic sinusitis 12/04/2021   Mixed hyperlipidemia 11/30/2021   Esophageal hiatal hernia 11/22/2021   Irritable bowel syndrome with constipation 11/22/2021   Anxiety 06/26/2021   Eczema 06/26/2021   Barrett's esophagus 04/11/2021   History of colonic polyps 04/11/2021   Focal neurological deficit 02/23/2021   GERD (gastroesophageal reflux disease) 01/08/2018    Past Surgical History:  Procedure Laterality Date   BIOPSY  03/18/2021   Procedure: BIOPSY;  Surgeon: Urban Garden, MD;  Location: [facility];  Service: Gastroenterology;;  esophageal at Z-line   BIOPSY  05/23/2021   Procedure: BIOPSY;  Surgeon: Urban Garden, MD;  Location: [facility];  Service: Gastroenterology;;   BIOPSY  08/19/2023   Procedure: BIOPSY;  Surgeon: Umberto Ganong, Bearl Limes, MD;  Location: [facility];  Service: Gastroenterology;;   CARDIOPULMONARY EXERCISE TEST (CPX)  01/09/2022   Interpretation limited by submaximal effort.  Severe functional impairment when compared to max sedentary norms.  No cardiopulmonary limitations.  Noted tremor and generalized weakness that lead to premature exercise termination-consider neuromuscular evaluation.   COLONOSCOPY N/A 09/22/2017   Procedure: COLONOSCOPY;  Surgeon: Ruby Corporal, MD;  Location: [facility];  Service:  Endoscopy;  Laterality: N/A;  9:55   COLONOSCOPY WITH PROPOFOL  N/A 05/23/2021   Procedure: COLONOSCOPY WITH PROPOFOL ;  Surgeon: Urban Garden, MD;  Location: [facility];  Service: Gastroenterology;  Laterality: N/A;  7:30   ESOPHAGOGASTRODUODENOSCOPY (EGD) WITH PROPOFOL  N/A 03/18/2021   Procedure: ESOPHAGOGASTRODUODENOSCOPY (EGD) WITH PROPOFOL ;  Surgeon: Urban Garden, MD;  [facility]: [facility];  Service: Gastroenterology;  Laterality: N/A;   ESOPHAGOGASTRODUODENOSCOPY (EGD) WITH PROPOFOL  N/A 05/23/2021   Procedure: ESOPHAGOGASTRODUODENOSCOPY (EGD) WITH PROPOFOL ;  Surgeon: Urban Garden, MD;  [facility]: [facility];  Service: Gastroenterology;  Laterality: N/A;   ESOPHAGOGASTRODUODENOSCOPY (EGD) WITH PROPOFOL  N/A 08/19/2023   Procedure: ESOPHAGOGASTRODUODENOSCOPY (EGD) WITH PROPOFOL ;  Surgeon: Urban Garden, MD;  [facility]: [facility];  Service: Gastroenterology;  Laterality: N/A;  11:45AM;ASA 3   ESOPHAGOGASTRODUODENOSCOPY (EGD) WITH PROPOFOL  N/A 10/08/2023   Procedure: ESOPHAGOGASTRODUODENOSCOPY (EGD) WITH PROPOFOL ;  Surgeon: Urban Garden, MD;  [facility]: [facility];  Service: Gastroenterology;  Laterality: N/A;  1:45PM;ASA 1-2   GIVENS CAPSULE STUDY N/A 10/08/2023   Procedure: GIVENS CAPSULE STUDY;  Surgeon: Urban Garden, MD;  [facility]: [facility];  Service: Gastroenterology;  Laterality: N/A;  1:45PM;ASA 1-2   POLYPECTOMY  09/22/2017   Procedure: POLYPECTOMY;  Surgeon: Ruby Corporal, MD;  [facility]: [facility];  Service: Endoscopy;;   RIGHT/LEFT HEART CATH AND CORONARY ANGIOGRAPHY N/A 12/12/2021   Procedure: RIGHT/LEFT HEART CATH AND CORONARY ANGIOGRAPHY;  Surgeon: Mardell Shade, MD;  Location: [facility];  Service: Cardiovascular:: Normal Coronaries & Hemodynamics.  EF 50-55%. RA = 4 mmHg, RV = 23/4; PA = 22/6 (14) & PCW = 7; Ao sat = 95%; PA sat = 76%, 76%& SVC sat = 74%Fick cardiac  output/index = 7.1/3.0; PVR = 1.0 WU   TRANSTHORACIC ECHOCARDIOGRAM  02/24/2021   EF 60 to 65%.  Normal LV function.  Normal valves.  Mild RV dilation with normal function.   ZIO PATCH EVENT MONITOR  01/2022   Sinus rhythm with heart rate range 24 to 159 bpm, 1  AVB and Wenckebach block noted along with rare PACs and PVCs.  No arrhythmias.       Home Medications    Prior to Admission medications   Medication Sig Start Date End Date Taking? Authorizing Provider  amLODipine  (NORVASC ) 5 MG tablet Take 1 tablet by mouth once daily 03/04/24   Patel, Rutwik K, MD  b complex vitamins capsule Take 1 capsule by mouth daily. With D3 gummie    [provider]  benzonatate  (TESSALON ) 100 MG capsule Take 1 capsule (100 mg total) by mouth 3 (three) times daily as needed for cough. Do not take with alcohol or while operating or driving heavy machinery 1/61/09   Wilhemena Harbour, NP  Blood Glucose Monitoring Suppl DEVI 1 each by Does not apply route in the morning, at noon, and at bedtime. May substitute to any manufacturer covered by patient's insurance. 10/13/23   Patel, Rutwik K, MD  capsicum (ZOSTRIX) 0.075 % topical cream Apply 1 Application topically 4 (four) times daily. 01/28/24   Leath-Warren, Belen Bowers, NP  cetirizine (ZYRTEC) 10 MG tablet Take 10 mg by mouth daily.    [provider]  Cholecalciferol (VITAMIN D -3 PO) Take by mouth  daily at 6 (six) AM.    [provider]  clindamycin  (CLEOCIN  T) 1 % external solution Apply topically to acne bumps at aa's qd 06/17/22   Artemio Larry, MD  clindamycin  (CLEOCIN -T) 1 % lotion Apply to affected areas of bumps once daily after shower. 03/17/24   Artemio Larry, MD  clobetasol  (TEMOVATE ) 0.05 % external solution Apply 1 application. topically 2 (two) times daily. Use as directed.  Mix with cerave cream. Use for up to 4 weeks. Avoid applying to face, groin, and axilla. Use as directed. 03/21/22   Artemio Larry, MD  clobetasol  cream  (TEMOVATE ) 0.05 % Spot treat more severe areas once to twice daily until improved. Avoid applying to face, groin, and axilla. Use as directed. Long-term use can cause thinning of the skin. 03/17/24   Artemio Larry, MD  Continuous Glucose Receiver (DEXCOM G6 RECEIVER) DEVI Use to check blood sugar daily DX e11.65 10/23/23   Meldon Sport, MD  Continuous Glucose Sensor (DEXCOM G6 SENSOR) MISC Use to check blood sugar daily DX e11.65 10/23/23   Meldon Sport, MD  Continuous Glucose Transmitter (DEXCOM G6 TRANSMITTER) MISC Use to check blood sugar daily DX e11.65 10/23/23   Meldon Sport, MD  cyclobenzaprine  (FLEXERIL ) 5 MG tablet Take 1 tablet (5 mg total) by mouth at bedtime as needed for muscle spasms. 08/06/23   Meldon Sport, MD  diphenhydrAMINE -zinc  acetate (BENADRYL  EXTRA STRENGTH) cream Apply 1 application topically 3 (three) times daily as needed for itching. 12/07/21   Paseda, Folashade R, FNP  docusate sodium  (COLACE) 100 MG capsule Take 100 mg by mouth 2 (two) times daily.    [provider]  DULoxetine (CYMBALTA) 30 MG capsule Take 30 mg by mouth 2 (two) times daily. 01/05/24   [provider]  fenofibrate  (TRICOR ) 145 MG tablet Take 1 tablet (145 mg total) by mouth daily. Please call [phone] to schedule an overdue appointment for future refills. Thank you. FINAL ATTEMPT. 01/27/24   Arleen Lacer, MD  fluocinonide  (LIDEX ) 0.05 % external solution Apply once or twice daily to affected areas on scalp up to 2 weeks as needed for itching. Avoid applying to face, groin, and axilla. 06/11/22   Artemio Larry, MD  fluticasone  (FLONASE  ALLERGY RELIEF) 50 MCG/ACT nasal spray Place 2 sprays into both nostrils daily as needed for allergies or rhinitis. 11/14/23   Patel, Rutwik K, MD  gabapentin (NEURONTIN) 300 MG capsule Take 300 mg by mouth 3 (three) times daily.    [provider]  Glucose Blood (BLOOD GLUCOSE TEST STRIPS) STRP 1 each by In Vitro route in the morning, at  noon, and at bedtime. May substitute to any manufacturer covered by patient's insurance. 10/13/23   Meldon Sport, MD  hydrOXYzine  (ATARAX ) 25 MG tablet Take 1-2 tablets by mouth at bedtime as needed for itch. 03/17/24   Artemio Larry, MD  hydrOXYzine  (VISTARIL ) 25 MG capsule TAKE 1 TO 2 CAPSULES BY MOUTH AT BEDTIME FOR ITCHING (  CAN  CAUSE  DROWSINESS) 03/08/24   Artemio Larry, MD  JARDIANCE  10 MG TABS tablet TAKE 1 TABLET BY MOUTH ONCE DAILY BEFORE BREAKFAST 01/26/24   Meldon Sport, MD  ketoconazole  (NIZORAL ) 2 % shampoo Massage into scalp 2-3 times a week, let sit several minutes before rinsing. 03/17/24   Artemio Larry, MD  lubiprostone  (AMITIZA ) 24 MCG capsule TAKE 1 CAPSULE BY MOUTH TWICE DAILY WITH A MEAL 12/29/23   Carlan, Chelsea L, NP  montelukast  (SINGULAIR ) 10 MG  tablet TAKE 1 TABLET BY MOUTH AT BEDTIME 08/12/23   Wilfredo Hanly, MD  olmesartan (BENICAR) 5 MG tablet Take 5 mg by mouth daily. 12/17/23   [provider]  omeprazole  (PRILOSEC) 40 MG capsule Take 1 capsule by mouth twice daily 03/04/24   Castaneda Mayorga, Bearl Limes, MD  ondansetron  (ZOFRAN ) 4 MG tablet Take 1 tablet (4 mg total) by mouth every 6 (six) hours as needed for nausea. 06/04/21   Buena Carmine, NP  propranolol  (INDERAL ) 20 MG tablet Take one tablet 20 mg  twice daily  may take an  additional dose of 20 mg  as needed for palpitations 03/25/22   Arleen Lacer, MD  rosuvastatin  (CRESTOR ) 40 MG tablet Take 1 tablet (40 mg total) by mouth daily. 01/15/24   Arleen Lacer, MD  testosterone  (ANDROGEL ) 50 MG/5GM (1%) GEL Place 5 g onto the skin daily. 03/05/24   Homero Luster, MD  triamcinolone  cream (KENALOG ) 0.1 %  03/07/22   [provider]  Vitamin D , Ergocalciferol , (DRISDOL ) 1.25 MG (50000 UNIT) CAPS capsule Take 1 capsule (50,000 Units total) by mouth every 7 (seven) days. 10/01/23   Zarwolo, Gloria, FNP    Family History Family History  Problem Relation Age of Onset   Healthy Mother    Cervical  cancer Mother    Cancer Father    Colon cancer Maternal Grandmother    Colon cancer Maternal Grandfather     Social History Social History   Tobacco Use   Smoking status: Never    Passive exposure: Past   Smokeless tobacco: Never  Vaping Use   Vaping status: Never Used  Substance Use Topics   Alcohol use: Not Currently    Comment: occasionally   Drug use: No     Allergies   Gramineae pollens, Claritin [loratadine], Loratadine-pseudoephedrine er, and Pollen extract   Review of Systems Review of Systems Per HPI  Physical Exam Triage Vital Signs ED Triage Vitals  Encounter Vitals Group     BP 03/23/24 0930 128/78     Systolic BP Percentile --      Diastolic BP Percentile --      Pulse Rate 03/23/24 0930 65     Resp 03/23/24 0930 18     Temp 03/23/24 0930 98.5 F (36.9 C)     Temp Source 03/23/24 0930 Oral     SpO2 03/23/24 0930 94 %     Weight --      Height --      Head Circumference --      Peak Flow --      Pain Score 03/23/24 0932 0     Pain Loc --      Pain Education --      Exclude from Growth Chart --    No data found.  Updated Vital Signs BP 128/78 (BP Location: Right Arm)   Pulse 65   Temp 98.5 F (36.9 C) (Oral)   Resp 18   SpO2 94%   Visual Acuity Right Eye Distance:   Left Eye Distance:   Bilateral Distance:    Right Eye Near:   Left Eye Near:    Bilateral Near:     Physical Exam Vitals and nursing note reviewed.  Constitutional:      General: He is not in acute distress.    Appearance: Normal appearance. He is not toxic-appearing.  HENT:     Head: Normocephalic and atraumatic.     Mouth/Throat:     Mouth:  Mucous membranes are moist.     Pharynx: Oropharynx is clear.     Comments: Oral airway is patent Pulmonary:     Effort: Pulmonary effort is normal. No respiratory distress.  Skin:    General: Skin is warm and dry.     Capillary Refill: Capillary refill takes less than 2 seconds.     Coloration: Skin is not jaundiced or  pale.     Comments: No rash appreciable  Neurological:     Mental Status: He is alert and oriented to person, place, and time.  Psychiatric:        Behavior: Behavior is cooperative.      UC Treatments / Results  Labs (all labs ordered are listed, but only abnormal results are displayed) Labs Reviewed - No data to display  EKG   Radiology No results found.  Procedures Procedures (including critical care time)  Medications Ordered in UC Medications  methylPREDNISolone  acetate (DEPO-MEDROL ) injection 40 mg (40 mg Intramuscular Given 03/23/24 0954)    Initial Impression / Assessment and Plan / UC Course  I have reviewed the triage vital signs and the nursing notes.  Pertinent labs & imaging results that were available during my care of the patient were reviewed by me and considered in my medical decision making (see chart for details).   Patient is well-appearing, normotensive, afebrile, not tachycardic, not tachypneic, oxygenating well on room air.    1. Pruritus 2. Contact with poison oak Vitals and exam are reassuring today Will treat with Depo-Medrol  40 mg IM in urgent care today, start oral antihistamine twice daily as needed for itching Oral steroids deferred at this time given no obvious rash Return and ER precautions discussed with patient  The patient was given the opportunity to ask questions.  All questions answered to their satisfaction.  The patient is in agreement to this plan.   Final Clinical Impressions(s) / UC Diagnoses   Final diagnoses:  Pruritus  Contact with poison oak   Discharge Instructions      We have given you an injection of steroid medicine today to help with the itching.  Recommend starting the oral and histamine twice daily to help with the itching as well.  Seek care if symptoms persist/worsen despite treatment.  ED Prescriptions   None    PDMP not reviewed this encounter.   Wilhemena Harbour, NP 03/23/24 1136

## 2024-03-24 ENCOUNTER — Ambulatory Visit (INDEPENDENT_AMBULATORY_CARE_PROVIDER_SITE_OTHER): Admitting: Internal Medicine

## 2024-03-24 ENCOUNTER — Other Ambulatory Visit: Payer: Self-pay

## 2024-03-24 ENCOUNTER — Encounter: Payer: Self-pay | Admitting: Internal Medicine

## 2024-03-24 VITALS — BP 117/75 | HR 93 | Ht 75.0 in | Wt 260.8 lb

## 2024-03-24 DIAGNOSIS — E1122 Type 2 diabetes mellitus with diabetic chronic kidney disease: Secondary | ICD-10-CM

## 2024-03-24 DIAGNOSIS — D751 Secondary polycythemia: Secondary | ICD-10-CM

## 2024-03-24 DIAGNOSIS — Z7984 Long term (current) use of oral hypoglycemic drugs: Secondary | ICD-10-CM | POA: Diagnosis not present

## 2024-03-24 DIAGNOSIS — E038 Other specified hypothyroidism: Secondary | ICD-10-CM | POA: Diagnosis not present

## 2024-03-24 DIAGNOSIS — K581 Irritable bowel syndrome with constipation: Secondary | ICD-10-CM

## 2024-03-24 DIAGNOSIS — R42 Dizziness and giddiness: Secondary | ICD-10-CM | POA: Diagnosis not present

## 2024-03-24 DIAGNOSIS — N1831 Chronic kidney disease, stage 3a: Secondary | ICD-10-CM

## 2024-03-24 DIAGNOSIS — I10 Essential (primary) hypertension: Secondary | ICD-10-CM | POA: Diagnosis not present

## 2024-03-24 DIAGNOSIS — R55 Syncope and collapse: Secondary | ICD-10-CM | POA: Diagnosis not present

## 2024-03-24 DIAGNOSIS — I471 Supraventricular tachycardia, unspecified: Secondary | ICD-10-CM | POA: Diagnosis not present

## 2024-03-24 NOTE — Assessment & Plan Note (Signed)
 Last BMP showed stable GFR - 54 Maintain adequate hydration On Jardiance Check CMP

## 2024-03-24 NOTE — Assessment & Plan Note (Signed)
 Had borderline low free T4 with normal TSH recently He has chronic constipation, tremors and anxiety Check repeat TSH and free T4

## 2024-03-24 NOTE — Assessment & Plan Note (Signed)
 Usually has chronic constipation Has tried Dulcolax without much relief Advised to take MiraLAX as needed On Amitiza, advised to hold if he has diarrhea Follow-up with GI

## 2024-03-24 NOTE — Assessment & Plan Note (Signed)
 BP Readings from Last 1 Encounters:  03/24/24 117/75   Well-controlled with Amlodipine  5 mg once daily and Olmesartan 5 mg QD On propranolol  for PSVT and tremors Counseled for compliance with the medications Advised DASH diet and moderate exercise/walking, at least 150 mins/week

## 2024-03-24 NOTE — Patient Instructions (Addendum)
 Please continue to take medications as prescribed.  Please continue to follow low carb diet and perform moderate exercise/walking at least 150 mins/week.  Please get blood tests done after 5 weeks (after 05/01/24).

## 2024-03-24 NOTE — Assessment & Plan Note (Addendum)
 Facial flushing and hot sensation appears to be presyncopal Advised again to maintain adequate hydration and eat at regular intervals Needs to avoid overexertion He likely has panic episodes at times as well, has Vistaril  as needed for it

## 2024-03-24 NOTE — Assessment & Plan Note (Signed)
 Lab Results  Component Value Date   HGBA1C 6.5 (H) 01/29/2024   Overall well-controlled On Jardiance  10 mg QD Has CGM -used to have hypoglycemia, no recent episode of hypoglycemia, blood glucose in range - 85% Advised to follow diabetic diet F/u CMP, HbA1c and lipid panel Diabetic eye exam: Advised to follow up with Ophthalmology for diabetic eye exam  Has CKD stage 3a, followed by Nephrology, needs to improve hydration

## 2024-03-24 NOTE — Progress Notes (Signed)
 Established Patient Office Visit  Subjective:  Patient ID: Elijah Shaffer, male    DOB: 18-Apr-1981  Age: 43 y.o. MRN: [id number]  CC:  Chief Complaint  Patient presents with   Medical Management of Chronic Issues    4 month f/u, has concerns about feeling hot at times.     HPI Elijah Shaffer is a 43 y.o. male with past medical history of HTN, type 2 DM, GERD, eczema, anxiety and chronic dyspnea who presents for f/u of his chronic medical conditions.  His blood pressure is wnl today.  He takes amlodipine  5 mg once daily and olmesartan 5 mg QD regularly.  He has chronic palpitations and dyspnea on exertion.  Denies any chest pain currently.  He takes propranolol  for palpitations.  Has had cardiac and pulmonology workup, which have been overall benign. He reports occasional facial flushing and hot sensation, especially after working for long hours.   Type II DM: His HbA1c was 6.5 in 03/25.  He takes Jardiance  10 mg QD.  Denies any polyuria or polydipsia currently.  He reports that he likes doughnuts, but has cut down them and soft drinks. He has Dexcom. He had 2 episodes of hypoglycemia (<50) before the previous visit, but did not confirm with fingerprick. Denies over sweating or dizziness during those reported hypoglycemia spells.  IBS: Denies any abdominal pain currently.  He usually has chronic constipation and takes Amitiza  for it.  Denies any melena or hematochezia currently. He had EGD for concern for melena in 11/24, but showed no signs of active bleeding.   Anxiety: He takes Cymbalta currently.  His Prozac  was discontinued due to excessive sweating concern, his sweating has improved now.  He still has episodes of anxiety and tremors.  He was placed on clonazepam by Dr. Joleen Navy, but was discontinued by [facility].  He has multiple physical complaints such as constant itching, dyspnea, tremors, etc., likely somewhat attributed to anxiety.  He has hydroxyzine  as needed for  itching or anxiety.   He had urology evaluation for low testosterone . He is on testosterone  supplement through gel, followed by Urology.  Denies any dysuria, hematuria or urethral discharge currently.  He has chronic fatigue, but denies any fever, chills, hemoptysis, recent weight loss or LAD.    Past Medical History:  Diagnosis Date   Acute renal injury (HCC) 06/28/2017   Anxiety    Aspiration pneumonia of right upper lobe due to gastric secretions (HCC)    Diabetes mellitus without complication (HCC)    Diverticulitis    GERD (gastroesophageal reflux disease) 01/08/2018   Hypertension    Prediabetes    Seasonal allergies    Sleep apnea    Syncope 06/26/2021    Past Surgical History:  Procedure Laterality Date   BIOPSY  03/18/2021   Procedure: BIOPSY;  Surgeon: Urban Garden, MD;  [facility];  Service: Gastroenterology;;  esophageal at Z-line   BIOPSY  05/23/2021   Procedure: BIOPSY;  Surgeon: Urban Garden, MD;  [facility];  Service: Gastroenterology;;   BIOPSY  08/19/2023   Procedure: BIOPSY;  Surgeon: Urban Garden, MD;  [facility];  Service: Gastroenterology;;   CARDIOPULMONARY EXERCISE TEST (CPX)  01/09/2022   Interpretation limited by submaximal effort.  Severe functional impairment when compared to max sedentary norms.  No cardiopulmonary limitations.  Noted tremor and generalized weakness that lead to premature exercise termination-consider neuromuscular evaluation.   COLONOSCOPY N/A 09/22/2017   Procedure: COLONOSCOPY;  Surgeon: Ruby Corporal, MD;  [facility];  Service: Endoscopy;  Laterality: N/A;  9:55   COLONOSCOPY WITH PROPOFOL  N/A 05/23/2021   Procedure: COLONOSCOPY WITH PROPOFOL ;  Surgeon: Urban Garden, MD;  [facility];  Service: Gastroenterology;  Laterality: N/A;  7:30   ESOPHAGOGASTRODUODENOSCOPY (EGD) WITH PROPOFOL  N/A 03/18/2021   Procedure:  ESOPHAGOGASTRODUODENOSCOPY (EGD) WITH PROPOFOL ;  Surgeon: Urban Garden, MD;  [facility];  Service: Gastroenterology;  Laterality: N/A;   ESOPHAGOGASTRODUODENOSCOPY (EGD) WITH PROPOFOL  N/A 05/23/2021   Procedure: ESOPHAGOGASTRODUODENOSCOPY (EGD) WITH PROPOFOL ;  Surgeon: Urban Garden, MD;  [facility];  Service: Gastroenterology;  Laterality: N/A;   ESOPHAGOGASTRODUODENOSCOPY (EGD) WITH PROPOFOL  N/A 08/19/2023   Procedure: ESOPHAGOGASTRODUODENOSCOPY (EGD) WITH PROPOFOL ;  Surgeon: Urban Garden, MD;  [facility];  Service: Gastroenterology;  Laterality: N/A;  11:45AM;ASA 3   ESOPHAGOGASTRODUODENOSCOPY (EGD) WITH PROPOFOL  N/A 10/08/2023   Procedure: ESOPHAGOGASTRODUODENOSCOPY (EGD) WITH PROPOFOL ;  Surgeon: Urban Garden, MD;  [facility];  Service: Gastroenterology;  Laterality: N/A;  1:45PM;ASA 1-2   GIVENS CAPSULE STUDY N/A 10/08/2023   Procedure: GIVENS CAPSULE STUDY;  Surgeon: Urban Garden, MD;  [facility];  Service: Gastroenterology;  Laterality: N/A;  1:45PM;ASA 1-2   POLYPECTOMY  09/22/2017   Procedure: POLYPECTOMY;  Surgeon: Ruby Corporal, MD;  [facility];  Service: Endoscopy;;   RIGHT/LEFT HEART CATH AND CORONARY ANGIOGRAPHY N/A 12/12/2021   Procedure: RIGHT/LEFT HEART CATH AND CORONARY ANGIOGRAPHY;  Surgeon: Mardell Shade, MD;  [facility]: [facility];  Service: Cardiovascular:: Normal Coronaries & Hemodynamics.  EF 50-55%. RA = 4 mmHg, RV = 23/4; PA = 22/6 (14) & PCW = 7; Ao sat = 95%; PA sat = 76%, 76%& SVC sat = 74%Fick cardiac output/index = 7.1/3.0; PVR = 1.0 WU   TRANSTHORACIC ECHOCARDIOGRAM  02/24/2021   EF 60 to 65%.  Normal LV function.  Normal valves.  Mild RV dilation with normal function.   ZIO PATCH EVENT MONITOR  01/2022   Sinus rhythm with heart rate range 24 to 159 bpm, 1  AVB and Wenckebach block noted along with rare PACs and  PVCs.  No arrhythmias.    Family History  Problem Relation Age of Onset   Healthy Mother    Cervical cancer Mother    Cancer Father    Colon cancer Maternal Grandmother    Colon cancer Maternal Grandfather     Social History   Socioeconomic History   Marital status: Single    Spouse name: Not on file   Number of children: Not on file   Years of education: Not on file   Highest education level: Not on file  Occupational History   Not on file  Tobacco Use   Smoking status: Never    Passive exposure: Past   Smokeless tobacco: Never  Vaping Use   Vaping status: Never Used  Substance and Sexual Activity   Alcohol use: Not Currently    Comment: occasionally   Drug use: No   Sexual activity: Not on file  Other Topics Concern   Not on file  Social History Narrative   He currently has his own lawn care landscaping service. He previous had 80 yards before he got sick and since decreased. He works around a Interior and spatial designer. One of the chemical names is roundup.    Social Drivers of Corporate investment banker Strain: Not on file  Food Insecurity:  No Food Insecurity (06/20/2023)   Hunger Vital Sign    Worried About Running Out of Food in the Last Year: Never true    Ran Out of Food in the Last Year: Never true  Transportation Needs: No Transportation Needs (12/27/2022)   PRAPARE - Administrator, Civil Service (Medical): No    Lack of Transportation (Non-Medical): No  Physical Activity: Insufficiently Active (03/29/2022)   Exercise Vital Sign    Days of Exercise per Week: 2 days    Minutes of Exercise per Session: 10 min  Stress: Stress Concern Present (05/07/2022)   [employer] of Occupational Health - Occupational Stress Questionnaire    Feeling of Stress : To some extent  Social Connections: Not on file  Intimate Partner Violence: Not At Risk (06/20/2023)   Humiliation, Afraid, Rape, and Kick questionnaire    Fear of Current or Ex-Partner: No     Emotionally Abused: No    Physically Abused: No    Sexually Abused: No    Outpatient Medications Prior to Visit  Medication Sig Dispense Refill   amLODipine  (NORVASC ) 5 MG tablet Take 1 tablet by mouth once daily 90 tablet 0   b complex vitamins capsule Take 1 capsule by mouth daily. With D3 gummie     benzonatate  (TESSALON ) 100 MG capsule Take 1 capsule (100 mg total) by mouth 3 (three) times daily as needed for cough. Do not take with alcohol or while operating or driving heavy machinery 21 capsule 0   Blood Glucose Monitoring Suppl DEVI 1 each by Does not apply route in the morning, at noon, and at bedtime. May substitute to any manufacturer covered by patient's insurance. 1 each 0   capsicum (ZOSTRIX) 0.075 % topical cream Apply 1 Application topically 4 (four) times daily. 57 g 0   cetirizine (ZYRTEC) 10 MG tablet Take 10 mg by mouth daily.     Cholecalciferol (VITAMIN D -3 PO) Take by mouth daily at 6 (six) AM.     clindamycin  (CLEOCIN  T) 1 % external solution Apply topically to acne bumps at aa's qd 30 mL 3   clindamycin  (CLEOCIN -T) 1 % lotion Apply to affected areas of bumps once daily after shower. 60 mL 3   clobetasol  (TEMOVATE ) 0.05 % external solution Apply 1 application. topically 2 (two) times daily. Use as directed.  Mix with cerave cream. Use for up to 4 weeks. Avoid applying to face, groin, and axilla. Use as directed. 50 mL 1   clobetasol  cream (TEMOVATE ) 0.05 % Spot treat more severe areas once to twice daily until improved. Avoid applying to face, groin, and axilla. Use as directed. Long-term use can cause thinning of the skin. 60 g 3   Continuous Glucose Sensor (DEXCOM G6 SENSOR) MISC Use to check blood sugar daily DX e11.65 3 each 5   Continuous Glucose Transmitter (DEXCOM G6 TRANSMITTER) MISC Use to check blood sugar daily DX e11.65 1 each 3   cyclobenzaprine  (FLEXERIL ) 5 MG tablet Take 1 tablet (5 mg total) by mouth at bedtime as needed for muscle spasms. 30 tablet 1    diphenhydrAMINE -zinc  acetate (BENADRYL  EXTRA STRENGTH) cream Apply 1 application topically 3 (three) times daily as needed for itching. 28.4 g 0   docusate sodium  (COLACE) 100 MG capsule Take 100 mg by mouth 2 (two) times daily.     DULoxetine (CYMBALTA) 30 MG capsule Take 30 mg by mouth 2 (two) times daily.     fenofibrate  (TRICOR ) 145 MG tablet Take 1 tablet (  145 mg total) by mouth daily. Please call [phone] to schedule an overdue appointment for future refills. Thank you. FINAL ATTEMPT. 15 tablet 0   fluocinonide  (LIDEX ) 0.05 % external solution Apply once or twice daily to affected areas on scalp up to 2 weeks as needed for itching. Avoid applying to face, groin, and axilla. 60 mL 2   fluticasone  (FLONASE  ALLERGY RELIEF) 50 MCG/ACT nasal spray Place 2 sprays into both nostrils daily as needed for allergies or rhinitis. 16 mL 2   gabapentin (NEURONTIN) 300 MG capsule Take 300 mg by mouth 3 (three) times daily.     Glucose Blood (BLOOD GLUCOSE TEST STRIPS) STRP 1 each by In Vitro route in the morning, at noon, and at bedtime. May substitute to any manufacturer covered by patient's insurance. 100 strip 1   hydrOXYzine  (ATARAX ) 25 MG tablet Take 1-2 tablets by mouth at bedtime as needed for itch. 60 tablet 5   hydrOXYzine  (VISTARIL ) 25 MG capsule TAKE 1 TO 2 CAPSULES BY MOUTH AT BEDTIME FOR ITCHING (  CAN  CAUSE  DROWSINESS) 60 capsule 0   JARDIANCE  10 MG TABS tablet TAKE 1 TABLET BY MOUTH ONCE DAILY BEFORE BREAKFAST 60 tablet 0   ketoconazole  (NIZORAL ) 2 % shampoo Massage into scalp 2-3 times a week, let sit several minutes before rinsing. 120 mL 11   lubiprostone  (AMITIZA ) 24 MCG capsule TAKE 1 CAPSULE BY MOUTH TWICE DAILY WITH A MEAL 180 capsule 1   montelukast  (SINGULAIR ) 10 MG tablet TAKE 1 TABLET BY MOUTH AT BEDTIME 90 tablet 3   olmesartan (BENICAR) 5 MG tablet Take 5 mg by mouth daily.     omeprazole  (PRILOSEC) 40 MG capsule Take 1 capsule by mouth twice daily 180 capsule 0   ondansetron   (ZOFRAN ) 4 MG tablet Take 1 tablet (4 mg total) by mouth every 6 (six) hours as needed for nausea. 20 tablet 0   propranolol  (INDERAL ) 20 MG tablet Take one tablet 20 mg  twice daily  may take an  additional dose of 20 mg  as needed for palpitations 190 tablet 3   rosuvastatin  (CRESTOR ) 40 MG tablet Take 1 tablet (40 mg total) by mouth daily. 15 tablet 0   testosterone  (ANDROGEL ) 50 MG/5GM (1%) GEL Place 5 g onto the skin daily. 150 g 5   triamcinolone  cream (KENALOG ) 0.1 %      Vitamin D , Ergocalciferol , (DRISDOL ) 1.25 MG (50000 UNIT) CAPS capsule Take 1 capsule (50,000 Units total) by mouth every 7 (seven) days. 20 capsule 1   Continuous Glucose Receiver (DEXCOM G6 RECEIVER) DEVI Use to check blood sugar daily DX e11.65 1 each 0   No facility-administered medications prior to visit.    Allergies  Allergen Reactions   Gramineae Pollens Itching   Claritin [Loratadine] Other (See Comments)    Heart race   Loratadine-Pseudoephedrine Er Palpitations   Pollen Extract Other (See Comments)    Seasonal allergies    ROS Review of Systems  Constitutional:  Positive for fatigue. Negative for chills and fever.  HENT:  Negative for congestion and sore throat.   Eyes:  Negative for pain and discharge.  Respiratory:  Positive for shortness of breath (Intermittent). Negative for cough.   Cardiovascular:  Positive for palpitations. Negative for chest pain.  Gastrointestinal:  Positive for constipation. Negative for diarrhea, nausea and vomiting.  Endocrine: Negative for polydipsia and polyuria.  Genitourinary:  Negative for dysuria and hematuria.  Musculoskeletal:  Positive for arthralgias, back pain, myalgias and neck pain.  Negative for neck stiffness.  Neurological:  Positive for tremors. Negative for headaches.  Psychiatric/Behavioral:  Positive for sleep disturbance. Negative for agitation and behavioral problems. The patient is nervous/anxious.       Objective:    Physical Exam Vitals  reviewed.  Constitutional:      General: He is not in acute distress.    Appearance: He is not diaphoretic.  HENT:     Head: Normocephalic and atraumatic.     Nose: No congestion.     Mouth/Throat:     Mouth: Mucous membranes are moist.  Eyes:     General: No scleral icterus.    Extraocular Movements: Extraocular movements intact.  Cardiovascular:     Rate and Rhythm: Normal rate and regular rhythm.     Heart sounds: Normal heart sounds. No murmur heard. Pulmonary:     Breath sounds: Normal breath sounds. No wheezing or rales.  Abdominal:     General: Bowel sounds are normal.     Palpations: Abdomen is soft.     Tenderness: There is no abdominal tenderness.  Musculoskeletal:        General: Tenderness (Mid thoracic and lumbar spine area) present.     Cervical back: Neck supple. No tenderness.     Right lower leg: No edema.     Left lower leg: No edema.  Skin:    General: Skin is warm.     Coloration: Skin is not jaundiced.  Neurological:     General: No focal deficit present.     Mental Status: He is alert and oriented to person, place, and time.     Sensory: No sensory deficit.     Motor: No weakness.     Comments: Functional tremors on right hand  Psychiatric:        Mood and Affect: Mood is anxious.        Behavior: Behavior normal.        Thought Content: Thought content does not include homicidal or suicidal ideation.     BP 117/75   Pulse 93   Ht 6\' 3"  (1.905 m)   Wt 260 lb 12.8 oz (118.3 kg)   SpO2 97%   BMI 32.60 kg/m  Wt Readings from Last 3 Encounters:  03/24/24 260 lb 12.8 oz (118.3 kg)  02/04/24 267 lb 6.4 oz (121.3 kg)  01/15/24 272 lb 6.4 oz (123.6 kg)    Lab Results  Component Value Date   TSH 1.010 01/29/2024   Lab Results  Component Value Date   WBC 7.7 01/28/2024   HGB 15.4 01/28/2024   HCT 46.9 01/28/2024   MCV 86 01/28/2024   PLT 288 01/28/2024   Lab Results  Component Value Date   NA 143 01/28/2024   K 4.3 01/28/2024   CO2 23  01/28/2024   GLUCOSE 99 01/28/2024   BUN 15 01/28/2024   CREATININE 1.57 (H) 01/28/2024   BILITOT 0.6 01/28/2024   ALKPHOS 88 01/28/2024   AST 33 01/28/2024   ALT 41 01/28/2024   PROT 7.0 01/28/2024   ALBUMIN 4.6 01/28/2024   CALCIUM  10.0 01/28/2024   ANIONGAP 11 09/24/2023   EGFR 56 (L) 01/28/2024   GFR 68.05 10/02/2021   Lab Results  Component Value Date   CHOL 108 01/29/2024   Lab Results  Component Value Date   HDL 34 (L) 01/29/2024   Lab Results  Component Value Date   LDLCALC 46 01/29/2024   Lab Results  Component Value Date   TRIG 168 (  H) 01/29/2024   Lab Results  Component Value Date   CHOLHDL 3.2 01/29/2024   Lab Results  Component Value Date   HGBA1C 6.5 (H) 01/29/2024      Assessment & Plan:   Problem List Items Addressed This Visit       Cardiovascular and Mediastinum   PSVT (paroxysmal supraventricular tachycardia) (HCC) (Chronic)   On propanolol 20 mg BID Has had cardiac monitor evaluation with Cardiology      Essential hypertension   BP Readings from Last 1 Encounters:  03/24/24 117/75   Well-controlled with Amlodipine  5 mg once daily and Olmesartan 5 mg QD On propranolol  for PSVT and tremors Counseled for compliance with the medications Advised DASH diet and moderate exercise/walking, at least 150 mins/week      Relevant Orders   CMP14+EGFR   TSH + free T4   CBC with Differential/Platelet   Postural dizziness with presyncope   Facial flushing and hot sensation appears to be presyncopal Advised again to maintain adequate hydration and eat at regular intervals Needs to avoid overexertion He likely has panic episodes at times as well, has Vistaril  as needed for it        Digestive   Irritable bowel syndrome with constipation   Usually has chronic constipation Has tried Dulcolax without much relief Advised to take MiraLAX  as needed On Amitiza , advised to hold if he has diarrhea Follow-up with GI        Endocrine   Type 2  diabetes mellitus with diabetic chronic kidney disease (HCC) - Primary   Lab Results  Component Value Date   HGBA1C 6.5 (H) 01/29/2024   Overall well-controlled On Jardiance  10 mg QD Has CGM -used to have hypoglycemia, no recent episode of hypoglycemia, blood glucose in range - 85% Advised to follow diabetic diet F/u CMP, HbA1c and lipid panel Diabetic eye exam: Advised to follow up with Ophthalmology for diabetic eye exam  Has CKD stage 3a, followed by Nephrology, needs to improve hydration      Relevant Orders   Hemoglobin A1c   Subclinical hypothyroidism   Had borderline low free T4 with normal TSH recently He has chronic constipation, tremors and anxiety Check repeat TSH and free T4        Genitourinary   Chronic kidney disease, stage 3a (HCC)   Last BMP showed stable GFR - 54 Maintain adequate hydration On Jardiance  Check CMP        No orders of the defined types were placed in this encounter.   Follow-up: Return in about 4 months (around 07/25/2024) for DM and HTN.    Meldon Sport, MD

## 2024-03-24 NOTE — Assessment & Plan Note (Signed)
 On propanolol 20 mg BID Has had cardiac monitor evaluation with Cardiology

## 2024-03-25 ENCOUNTER — Ambulatory Visit: Admitting: Podiatry

## 2024-03-25 ENCOUNTER — Inpatient Hospital Stay: Attending: Hematology

## 2024-03-25 ENCOUNTER — Inpatient Hospital Stay: Payer: 59

## 2024-03-25 DIAGNOSIS — M7752 Other enthesopathy of left foot: Secondary | ICD-10-CM | POA: Diagnosis not present

## 2024-03-25 DIAGNOSIS — R140 Abdominal distension (gaseous): Secondary | ICD-10-CM | POA: Insufficient documentation

## 2024-03-25 DIAGNOSIS — K449 Diaphragmatic hernia without obstruction or gangrene: Secondary | ICD-10-CM | POA: Diagnosis not present

## 2024-03-25 DIAGNOSIS — D751 Secondary polycythemia: Secondary | ICD-10-CM | POA: Diagnosis not present

## 2024-03-25 DIAGNOSIS — K2270 Barrett's esophagus without dysplasia: Secondary | ICD-10-CM | POA: Diagnosis not present

## 2024-03-25 DIAGNOSIS — M7751 Other enthesopathy of right foot: Secondary | ICD-10-CM | POA: Diagnosis not present

## 2024-03-25 DIAGNOSIS — K922 Gastrointestinal hemorrhage, unspecified: Secondary | ICD-10-CM | POA: Insufficient documentation

## 2024-03-25 LAB — CBC WITH DIFFERENTIAL/PLATELET
Abs Immature Granulocytes: 0.05 10*3/uL (ref 0.00–0.07)
Basophils Absolute: 0.1 10*3/uL (ref 0.0–0.1)
Basophils Relative: 1 %
Eosinophils Absolute: 0 10*3/uL (ref 0.0–0.5)
Eosinophils Relative: 0 %
HCT: 48.8 % (ref 39.0–52.0)
Hemoglobin: 15.8 g/dL (ref 13.0–17.0)
Immature Granulocytes: 0 %
Lymphocytes Relative: 30 %
Lymphs Abs: 3.5 10*3/uL (ref 0.7–4.0)
MCH: 28.2 pg (ref 26.0–34.0)
MCHC: 32.4 g/dL (ref 30.0–36.0)
MCV: 87.1 fL (ref 80.0–100.0)
Monocytes Absolute: 0.8 10*3/uL (ref 0.1–1.0)
Monocytes Relative: 7 %
Neutro Abs: 7 10*3/uL (ref 1.7–7.7)
Neutrophils Relative %: 62 %
Platelets: 252 10*3/uL (ref 150–400)
RBC: 5.6 MIL/uL (ref 4.22–5.81)
RDW: 14.7 % (ref 11.5–15.5)
WBC: 11.5 10*3/uL — ABNORMAL HIGH (ref 4.0–10.5)
nRBC: 0 % (ref 0.0–0.2)

## 2024-03-25 LAB — COMPREHENSIVE METABOLIC PANEL WITH GFR
ALT: 29 U/L (ref 0–44)
AST: 23 U/L (ref 15–41)
Albumin: 4.3 g/dL (ref 3.5–5.0)
Alkaline Phosphatase: 80 U/L (ref 38–126)
Anion gap: 8 (ref 5–15)
BUN: 20 mg/dL (ref 6–20)
CO2: 20 mmol/L — ABNORMAL LOW (ref 22–32)
Calcium: 9.6 mg/dL (ref 8.9–10.3)
Chloride: 108 mmol/L (ref 98–111)
Creatinine, Ser: 1.25 mg/dL — ABNORMAL HIGH (ref 0.61–1.24)
GFR, Estimated: >60 mL/min (ref >60)
Glucose, Bld: 108 mg/dL — ABNORMAL HIGH (ref 70–99)
Potassium: 3.6 mmol/L (ref 3.5–5.1)
Sodium: 136 mmol/L (ref 135–145)
Total Bilirubin: 1 mg/dL (ref 0.0–1.2)
Total Protein: 7.8 g/dL (ref 6.5–8.1)

## 2024-03-25 LAB — VITAMIN B12: Vitamin B-12: 376 pg/mL (ref 180–914)

## 2024-03-25 NOTE — Progress Notes (Signed)
 Subjective:  Patient ID: Elijah Shaffer, male    DOB: [birth year],  MRN: [id number]  Chief Complaint  Patient presents with   Foot Pain    43 y.o. male presents with the above complaint.  Patient presents with follow-up of bilateral second metatarsophalangeal joint.  Patient states that he is doing a lot better injection helped considerably denies any other acute complaints.   Review of Systems: Negative except as noted in the HPI. Denies N/V/F/Ch.  Past Medical History:  Diagnosis Date   Acute renal injury (HCC) 06/28/2017   Anxiety    Aspiration pneumonia of right upper lobe due to gastric secretions (HCC)    Diabetes mellitus without complication (HCC)    Diverticulitis    GERD (gastroesophageal reflux disease) 01/08/2018   Hypertension    Prediabetes    Seasonal allergies    Sleep apnea    Syncope 06/26/2021    Current Outpatient Medications:    amLODipine  (NORVASC ) 5 MG tablet, Take 1 tablet by mouth once daily, Disp: 90 tablet, Rfl: 0   b complex vitamins capsule, Take 1 capsule by mouth daily. With D3 gummie, Disp: , Rfl:    benzonatate  (TESSALON ) 100 MG capsule, Take 1 capsule (100 mg total) by mouth 3 (three) times daily as needed for cough. Do not take with alcohol or while operating or driving heavy machinery, Disp: 21 capsule, Rfl: 0   Blood Glucose Monitoring Suppl DEVI, 1 each by Does not apply route in the morning, at noon, and at bedtime. May substitute to any manufacturer covered by patient's insurance., Disp: 1 each, Rfl: 0   capsicum (ZOSTRIX) 0.075 % topical cream, Apply 1 Application topically 4 (four) times daily., Disp: 57 g, Rfl: 0   cetirizine (ZYRTEC) 10 MG tablet, Take 10 mg by mouth daily., Disp: , Rfl:    Cholecalciferol (VITAMIN D -3 PO), Take by mouth daily at 6 (six) AM., Disp: , Rfl:    clindamycin  (CLEOCIN  T) 1 % external solution, Apply topically to acne bumps at aa's qd, Disp: 30 mL, Rfl: 3   clindamycin  (CLEOCIN -T) 1 % lotion, Apply to affected  areas of bumps once daily after shower., Disp: 60 mL, Rfl: 3   clobetasol  (TEMOVATE ) 0.05 % external solution, Apply 1 application. topically 2 (two) times daily. Use as directed.  Mix with cerave cream. Use for up to 4 weeks. Avoid applying to face, groin, and axilla. Use as directed., Disp: 50 mL, Rfl: 1   clobetasol  cream (TEMOVATE ) 0.05 %, Spot treat more severe areas once to twice daily until improved. Avoid applying to face, groin, and axilla. Use as directed. Long-term use can cause thinning of the skin., Disp: 60 g, Rfl: 3   Continuous Glucose Sensor (DEXCOM G6 SENSOR) MISC, Use to check blood sugar daily DX e11.65, Disp: 3 each, Rfl: 5   Continuous Glucose Transmitter (DEXCOM G6 TRANSMITTER) MISC, Use to check blood sugar daily DX e11.65, Disp: 1 each, Rfl: 3   cyclobenzaprine  (FLEXERIL ) 5 MG tablet, Take 1 tablet (5 mg total) by mouth at bedtime as needed for muscle spasms., Disp: 30 tablet, Rfl: 1   diphenhydrAMINE -zinc  acetate (BENADRYL  EXTRA STRENGTH) cream, Apply 1 application topically 3 (three) times daily as needed for itching., Disp: 28.4 g, Rfl: 0   docusate sodium  (COLACE) 100 MG capsule, Take 100 mg by mouth 2 (two) times daily., Disp: , Rfl:    DULoxetine (CYMBALTA) 30 MG capsule, Take 30 mg by mouth 2 (two) times daily., Disp: , Rfl:  fenofibrate  (TRICOR ) 145 MG tablet, Take 1 tablet (145 mg total) by mouth daily. Please call [phone] to schedule an overdue appointment for future refills. Thank you. FINAL ATTEMPT., Disp: 15 tablet, Rfl: 0   fluocinonide  (LIDEX ) 0.05 % external solution, Apply once or twice daily to affected areas on scalp up to 2 weeks as needed for itching. Avoid applying to face, groin, and axilla., Disp: 60 mL, Rfl: 2   fluticasone  (FLONASE  ALLERGY RELIEF) 50 MCG/ACT nasal spray, Place 2 sprays into both nostrils daily as needed for allergies or rhinitis., Disp: 16 mL, Rfl: 2   gabapentin (NEURONTIN) 300 MG capsule, Take 300 mg by mouth 3 (three) times  daily., Disp: , Rfl:    Glucose Blood (BLOOD GLUCOSE TEST STRIPS) STRP, 1 each by In Vitro route in the morning, at noon, and at bedtime. May substitute to any manufacturer covered by patient's insurance., Disp: 100 strip, Rfl: 1   hydrOXYzine  (ATARAX ) 25 MG tablet, Take 1-2 tablets by mouth at bedtime as needed for itch., Disp: 60 tablet, Rfl: 5   hydrOXYzine  (VISTARIL ) 25 MG capsule, TAKE 1 TO 2 CAPSULES BY MOUTH AT BEDTIME FOR ITCHING (  CAN  CAUSE  DROWSINESS), Disp: 60 capsule, Rfl: 0   JARDIANCE  10 MG TABS tablet, TAKE 1 TABLET BY MOUTH ONCE DAILY BEFORE BREAKFAST, Disp: 60 tablet, Rfl: 0   ketoconazole  (NIZORAL ) 2 % shampoo, Massage into scalp 2-3 times a week, let sit several minutes before rinsing., Disp: 120 mL, Rfl: 11   lubiprostone  (AMITIZA ) 24 MCG capsule, TAKE 1 CAPSULE BY MOUTH TWICE DAILY WITH A MEAL, Disp: 180 capsule, Rfl: 1   montelukast  (SINGULAIR ) 10 MG tablet, TAKE 1 TABLET BY MOUTH AT BEDTIME, Disp: 90 tablet, Rfl: 3   olmesartan (BENICAR) 5 MG tablet, Take 5 mg by mouth daily., Disp: , Rfl:    omeprazole  (PRILOSEC) 40 MG capsule, Take 1 capsule by mouth twice daily, Disp: 180 capsule, Rfl: 0   ondansetron  (ZOFRAN ) 4 MG tablet, Take 1 tablet (4 mg total) by mouth every 6 (six) hours as needed for nausea., Disp: 20 tablet, Rfl: 0   propranolol  (INDERAL ) 20 MG tablet, Take one tablet 20 mg  twice daily  may take an  additional dose of 20 mg  as needed for palpitations, Disp: 190 tablet, Rfl: 3   rosuvastatin  (CRESTOR ) 40 MG tablet, Take 1 tablet (40 mg total) by mouth daily., Disp: 15 tablet, Rfl: 0   testosterone  (ANDROGEL ) 50 MG/5GM (1%) GEL, Place 5 g onto the skin daily., Disp: 150 g, Rfl: 5   triamcinolone  cream (KENALOG ) 0.1 %, , Disp: , Rfl:    Vitamin D , Ergocalciferol , (DRISDOL ) 1.25 MG (50000 UNIT) CAPS capsule, Take 1 capsule (50,000 Units total) by mouth every 7 (seven) days., Disp: 20 capsule, Rfl: 1  Social History   Tobacco Use  Smoking Status Never   Passive  exposure: Past  Smokeless Tobacco Never    Allergies  Allergen Reactions   Gramineae Pollens Itching   Claritin [Loratadine] Other (See Comments)    Heart race   Loratadine-Pseudoephedrine Er Palpitations   Pollen Extract Other (See Comments)    Seasonal allergies   Objective:  There were no vitals filed for this visit. There is no height or weight on file to calculate BMI. Constitutional Well developed. Well nourished.  Vascular Dorsalis pedis pulses palpable bilaterally. Posterior tibial pulses palpable bilaterally. Capillary refill normal to all digits.  No cyanosis or clubbing noted. Pedal hair growth normal.  Neurologic Normal  speech. Oriented to person, place, and time. Epicritic sensation to light touch grossly present bilaterally.  Dermatologic Nails well groomed and normal in appearance. No open wounds. No skin lesions.  Orthopedic: Pain on palpation bilateral second metatarsophalangeal joint pain with range of motion of the joint.  No deep intra-articular pain noted.  No extensor or flexor tendinitis noted.  Gait examination shows pes planovalgus deformity   Radiographs: None Assessment:   No diagnosis found.  Plan:  Patient was evaluated and treated and all questions answered.  Bilateral second metatarsophalangeal joint capsulitis - All questions and concerns were discussed with the patient extensive detail given the amount of pain that he is experiencing he will benefit from steroid injection of decrease inflammatory component associate with pain.  Patient agrees with the plan would like to proceed with steroid injection -A second steroid injection was performed at bilateral second using 1% plain Lidocaine  and 10 mg of Kenalog . This was well tolerated.   Pes planovalgus/foot deformity -I explained to patient the etiology of pes planovalgus and relationship with Planter fasciitis and various treatment options were discussed.  Given patient foot structure in the  setting of Planter fasciitis I believe patient will benefit from custom-made orthotics to help control the hindfoot motion support the arch of the foot and take the stress away from plantar fascial.  Patient agrees with the plan like to proceed with orthotics -Patient was casted for orthotics with offloading of bilateral second metatarsal phalangeal joint

## 2024-03-28 ENCOUNTER — Other Ambulatory Visit: Payer: Self-pay | Admitting: Internal Medicine

## 2024-03-28 DIAGNOSIS — E1122 Type 2 diabetes mellitus with diabetic chronic kidney disease: Secondary | ICD-10-CM

## 2024-03-28 LAB — METHYLMALONIC ACID, SERUM: Methylmalonic Acid, Quantitative: 151 nmol/L (ref 0–378)

## 2024-03-30 ENCOUNTER — Ambulatory Visit (INDEPENDENT_AMBULATORY_CARE_PROVIDER_SITE_OTHER): Admitting: Podiatry

## 2024-03-30 DIAGNOSIS — M21962 Unspecified acquired deformity of left lower leg: Secondary | ICD-10-CM

## 2024-03-30 DIAGNOSIS — M21961 Unspecified acquired deformity of right lower leg: Secondary | ICD-10-CM

## 2024-03-30 DIAGNOSIS — M7751 Other enthesopathy of right foot: Secondary | ICD-10-CM

## 2024-03-30 DIAGNOSIS — M7752 Other enthesopathy of left foot: Secondary | ICD-10-CM

## 2024-03-30 NOTE — Progress Notes (Signed)
 Orthotics are dispensed they are functioning well no acute complaints.

## 2024-04-01 ENCOUNTER — Inpatient Hospital Stay: Payer: 59 | Admitting: Oncology

## 2024-04-01 NOTE — Telephone Encounter (Signed)
 Has not been on testosterone  in a couple months please check prior authorization

## 2024-04-07 DIAGNOSIS — N1831 Chronic kidney disease, stage 3a: Secondary | ICD-10-CM | POA: Diagnosis not present

## 2024-04-07 DIAGNOSIS — E1122 Type 2 diabetes mellitus with diabetic chronic kidney disease: Secondary | ICD-10-CM | POA: Diagnosis not present

## 2024-04-07 DIAGNOSIS — I10 Essential (primary) hypertension: Secondary | ICD-10-CM | POA: Diagnosis not present

## 2024-04-08 ENCOUNTER — Inpatient Hospital Stay: Admitting: Oncology

## 2024-04-08 ENCOUNTER — Ambulatory Visit: Admitting: Internal Medicine

## 2024-04-08 ENCOUNTER — Ambulatory Visit: Payer: Self-pay | Admitting: Internal Medicine

## 2024-04-08 VITALS — BP 114/85 | HR 69 | Temp 97.5°F | Resp 18 | Ht 75.0 in | Wt 260.8 lb

## 2024-04-08 DIAGNOSIS — K2270 Barrett's esophagus without dysplasia: Secondary | ICD-10-CM | POA: Diagnosis not present

## 2024-04-08 DIAGNOSIS — K922 Gastrointestinal hemorrhage, unspecified: Secondary | ICD-10-CM | POA: Diagnosis not present

## 2024-04-08 DIAGNOSIS — D751 Secondary polycythemia: Secondary | ICD-10-CM

## 2024-04-08 DIAGNOSIS — K449 Diaphragmatic hernia without obstruction or gangrene: Secondary | ICD-10-CM | POA: Diagnosis not present

## 2024-04-08 DIAGNOSIS — R140 Abdominal distension (gaseous): Secondary | ICD-10-CM | POA: Diagnosis not present

## 2024-04-08 LAB — CMP14+EGFR
ALT: 29 IU/L (ref 0–44)
AST: 20 IU/L (ref 0–40)
Albumin: 4.8 g/dL (ref 4.1–5.1)
Alkaline Phosphatase: 100 IU/L (ref 44–121)
BUN/Creatinine Ratio: 14 (ref 9–20)
BUN: 17 mg/dL (ref 6–24)
Bilirubin Total: 0.6 mg/dL (ref 0.0–1.2)
CO2: 17 mmol/L — ABNORMAL LOW (ref 20–29)
Calcium: 9.9 mg/dL (ref 8.7–10.2)
Chloride: 104 mmol/L (ref 96–106)
Creatinine, Ser: 1.23 mg/dL (ref 0.76–1.27)
Globulin, Total: 2.3 g/dL (ref 1.5–4.5)
Glucose: 125 mg/dL — ABNORMAL HIGH (ref 70–99)
Potassium: 4.1 mmol/L (ref 3.5–5.2)
Sodium: 139 mmol/L (ref 134–144)
Total Protein: 7.1 g/dL (ref 6.0–8.5)
eGFR: 75 mL/min/{1.73_m2} (ref >59)

## 2024-04-08 LAB — CBC WITH DIFFERENTIAL/PLATELET
Basophils Absolute: 0.1 10*3/uL (ref 0.0–0.2)
Basos: 1 %
EOS (ABSOLUTE): 0 10*3/uL (ref 0.0–0.4)
Eos: 0 %
Hematocrit: 50.9 % (ref 37.5–51.0)
Hemoglobin: 16.6 g/dL (ref 13.0–17.7)
Immature Grans (Abs): 0.1 10*3/uL (ref 0.0–0.1)
Immature Granulocytes: 1 %
Lymphocytes Absolute: 2.9 10*3/uL (ref 0.7–3.1)
Lymphs: 24 %
MCH: 28.8 pg (ref 26.6–33.0)
MCHC: 32.6 g/dL (ref 31.5–35.7)
MCV: 88 fL (ref 79–97)
Monocytes Absolute: 0.8 10*3/uL (ref 0.1–0.9)
Monocytes: 6 %
Neutrophils Absolute: 8.4 10*3/uL — ABNORMAL HIGH (ref 1.4–7.0)
Neutrophils: 68 %
Platelets: 274 10*3/uL (ref 150–450)
RBC: 5.77 x10E6/uL (ref 4.14–5.80)
RDW: 14.2 % (ref 11.6–15.4)
WBC: 12.2 10*3/uL — ABNORMAL HIGH (ref 3.4–10.8)

## 2024-04-08 LAB — HEMOGLOBIN A1C
Est. average glucose Bld gHb Est-mCnc: 134 mg/dL
Hgb A1c MFr Bld: 6.3 % — ABNORMAL HIGH (ref 4.8–5.6)

## 2024-04-08 LAB — TSH+FREE T4
Free T4: 0.85 ng/dL (ref 0.82–1.77)
TSH: 1.28 u[IU]/mL (ref 0.450–4.500)

## 2024-04-08 NOTE — Progress Notes (Signed)
 [facility] at [facility] [address], [place], [place] [address] Main: [phone] Fax: [phone]  Follow-up Visit    Patient Care Team: Meldon Sport, MD as PCP - General (Internal Medicine) Arleen Lacer, MD as PCP - Cardiology (Cardiology) Gaile Jourdain Larene Pleasant, LCSW as [facility] Care Management (Licensed Clinical Social Worker) Paulett Boros, MD as Medical Oncologist (Hematology) Arlyce Berger, RN as [facility] Care Management Dayna Eve, MD as Referring Physician (Student) Umberto Ganong, Bearl Limes, MD as Consulting Physician (Gastroenterology) Patterson Bora, NP as Nurse Practitioner (Gastroenterology)   Name of the patient: Elijah Shaffer  [id number]  October 10, 1981   Date of visit: 04/08/2024    History of Present Illness: Elijah Shaffer 43 year old male with past medical history significant for erythrocytosis.  He was last seen in clinic on 10/03/2023.  He had low-dose phlebotomy (250 mL) on 08/08/2023 given for extreme fatigue and HA without significant change in his symptoms.   Reports he was recently evaluated for poison oak and given steroids.  Symptoms have resolved.  Bloating and pain has essentially resolved.  He is followed closely by gastroenterology for this. Had a CT scan of his abdomen on 08/01/2023 that showed severe hepatic steatosis without evidence of hepatic neoplasm or ascites.  High attenuation sludge or stone in gallbladder neck no evidence of cholecystitis or biliary ductal dilatation.  Large hiatal hernia.  Colonic diverticulosis without radiographic evidence of diverticulitis.   An upper endoscopy on 10/08/2023 due to melena.  Results showed Barrett's esophagus, hiatal hernia with normal duodenum.  Reports he overall feels well.  Intermittent episodes of feeling "hot" and has trouble cooling down.  Reports he has 3 bands he turns on when he gets home from work.  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  Reports he is having trouble with his CPAP machine and the breathing mouthpiece.  He needs to get refitted as he feels like he is not getting enough air at night.  He has not been using it over the past few months.  States he was diagnosed with a tick borne illness months to a year ago and was not sure if any of his symptoms were stemming from that.  Reports an appetite of 100% with energy levels of 50%.   Observations/Objective:  Review of Systems  Constitutional:  Positive for diaphoresis (at times) and malaise/fatigue.   Physical Exam Constitutional:      Appearance: Normal appearance. He is obese.  Cardiovascular:     Rate and Rhythm: Normal rate and regular rhythm.  Pulmonary:     Effort: Pulmonary effort is normal.     Breath sounds: Normal breath sounds.  Abdominal:     General: Bowel sounds are normal.     Palpations: Abdomen is soft.  Musculoskeletal:        General: No swelling. Normal range of motion.  Neurological:     Mental Status: He is alert and oriented to person, place, and time. Mental status is at baseline.      Assessment and Plan: 1. Erythrocytosis -Mild erythrocytosis first noted in January 2024 with CBC/D (12/02/2022) showing Hgb 18.1/HCT 52.5%.  Repeat CBC/D (12/23/2022) showed Hgb 18.2/HCT 53.8%. - CTAP (02/23/2021) showed normal-appearing spleen - Non-smoker.   He denies any sources of carbon monoxide exposure.    - No prior history of thrombosis. - He is not on testosterone  supplementation.  He does not take any diuretics. - No aquagenic pruritus,  vasomotor symptoms, or B symptoms. - Patient has obstructive sleep apnea, but had had not been wearing his CPAP for 2 to 3 months prior to onset of his erythrocytosis.  Since that time, he has had new mask fitting and is using his CPAP nightly for the past 2 to 3 weeks. - Hematology workup (12/27/2022): Negative for mutations of JAK2, CALR, MPL, Exons 12-15 Normal LDH, normal erythropoietin   11.6 -Labs have been fairly stable over the last few draws.  -He received a one-time 250 mL phlebotomy for severe fatigue and headaches on 08/08/2023 with mild improvement of the symptoms.   2.  GI bleeding/bloating and pain: - Patient had recent EGD on 10/08/2023 which showed 8 cm hiatal hernia, Barrett's esophagus with normal duodenum.  No active bleeding to explain his melena. -Had to positive fecal occult on 09/24/2023. -CT scan from 08/08/2023 showed hepatic steatosis and diverticular disease of the colon without acute wall thickening. -No additional GI bleeding since 09/24/2023 and his ED visit. -He will likely have to have hiatal hernia surgery at some point but this has not been scheduled yet.  Plan: 1. Erythrocytosis (Primary) -Hemoglobin from 04/07/2024 showed a hemoglobin of 16.6 (15.8).  Differential is essentially unremarkable.  Mild elevation in white blood cell count 4.2 with elevated absolute neutrophil count at 8.4 secondary to recent steroid use for poison oak.  Hematocrit 50.9. 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  Recommend improved compliance with CPAP. - No indication for therapeutic phlebotomy unless severely symptomatic or HCT >54.0% -Discussed returning to PCP for additional if the 1 he currently uses is bothering him.  We discussed compliance with his CPAP and having a refitted mask.  - We will recheck labs and see patient for follow-up visit in 6 months.  If hemoglobin remains normal over the next 6 to 12 months, would consider discharge to PCP. - Would consider bone marrow biopsy if unexplained elevations in hemoglobin or expansion to other cell lines  Follow Up Instructions: Follow-up with PCP for CPAP mask and fitting.  Return to clinic in 6 months with labs a few days before.   I discussed the assessment and treatment plan with the patient. The patient was provided an opportunity to ask questions  and all were answered. The patient agreed with the plan and demonstrated an understanding of the instructions.   The patient was advised to call back or seek an in-person evaluation if the symptoms worsen or if the condition fails to improve as anticipated. I spent 25 minutes dedicated to the care of this patient (face-to-face and non-face-to-face) on the date of the encounter to include what is described in the assessment and plan.   Aurther Blue, NP

## 2024-04-15 ENCOUNTER — Ambulatory Visit: Payer: Self-pay

## 2024-04-15 ENCOUNTER — Other Ambulatory Visit: Payer: Self-pay | Admitting: Urology

## 2024-04-15 ENCOUNTER — Other Ambulatory Visit: Payer: Self-pay | Admitting: Internal Medicine

## 2024-04-15 DIAGNOSIS — E291 Testicular hypofunction: Secondary | ICD-10-CM

## 2024-04-15 MED ORDER — TESTOSTERONE 25 MG/2.5GM (1%) TD GEL: 5.0000 g | Freq: Every morning | TRANSDERMAL | 5 refills | Status: DC

## 2024-04-15 NOTE — Telephone Encounter (Signed)
 FYI Only or Action Required?: FYI only for provider  Patient is followed in Pulmonology for OSA, last seen on 06/06/2023 by Wilfredo Hanly, MD. Called Nurse Triage reporting Shortness of Breath. Symptoms began today. Interventions attempted: Nothing. Symptoms are: stable.  Triage Disposition: Call PCP When Office is Open  Patient/caregiver understands and will follow disposition?: Yes   Copied from CRM [id number]. Topic: Clinical - Red Word Triage >> Apr 15, 2024  1:49 PM Elijah Shaffer wrote: [facility] that prompted transfer to Nurse Triage: Heavy breathing, winded Reason for Disposition  Requesting regular office appointment  Answer Assessment - Initial Assessment Questions E2C2 Pulmonary Triage - Initial Assessment Questions "Chief Complaint (e.g., cough, sob, wheezing, fever, chills, sweat or additional symptoms) *Go to specific symptom protocol after initial questions. Heavy winded, winded - unsure if r/t weather outside Baseline productive clear cough  "How long have symptoms been present?" today  Have you tested for COVID or Flu? Note: If not, ask patient if a home test can be taken. If so, instruct patient to call back for positive results. No, does endorse + COVID a few weeks ago  MEDICINES:   "Have you used any OTC meds to help with symptoms?" No If yes, ask "What medications?" N/a  "Have you used your inhalers/maintenance medication?" Yes If yes, "What medications?" Albuterol  INH PRN - has not used recently  If inhaler, ask "How many puffs and how often?" Note: Review instructions on medication in the chart. N/a  OXYGEN : "Do you wear supplemental oxygen ?" No If yes, "How many liters are you supposed to use?" N/a  "Do you monitor your oxygen  levels?" No If yes, "What is your reading (oxygen  level) today?" N/a  "What is your usual oxygen  saturation reading?"  (Note: Pulmonary O2 sats should be 90% or greater) N/a    3. PATTERN "Does the difficult breathing come  and go, or has it been constant since it started?"      intermittent 4. SEVERITY: "How bad is your breathing?" (e.g., mild, moderate, severe)    - MILD: No SOB at rest, mild SOB with walking, speaks normally in sentences, can lie down, no retractions, pulse < 100.    - MODERATE: SOB at rest, SOB with minimal exertion and prefers to sit, cannot lie down flat, speaks in phrases, mild retractions, audible wheezing, pulse 100-120.    - SEVERE: Very SOB at rest, speaks in single words, struggling to breathe, sitting hunched forward, retractions, pulse > 120      Mild - baseline with exertion and when goes outside 5. RECURRENT SYMPTOM: "Have you had difficulty breathing before?" If Yes, ask: "When was the last time?" and "What happened that time?"      denies 6. CARDIAC HISTORY: "Do you have any history of heart disease?" (e.g., heart attack, angina, bypass surgery, angioplasty)      denes 7. LUNG HISTORY: "Do you have any history of lung disease?"  (e.g., pulmonary embolus, asthma, emphysema)     OSA, hiatal hernia 8. CAUSE: "What do you think is causing the breathing problem?"      environment 9. OTHER SYMPTOMS: "Do you have any other symptoms? (e.g., dizziness, runny nose, cough, chest pain, fever)     denies 10. O2 SATURATION MONITOR:  "Do you use an oxygen  saturation monitor (pulse oximeter) at home?" If Yes, ask: "What is your reading (oxygen  level) today?" "What is your usual oxygen  saturation reading?" (e.g., 95%)       N/a 11. PREGNANCY: "Is there  any chance you are pregnant?" "When was your last menstrual period?"       N/a 12. TRAVEL: "Have you traveled out of the country in the last month?" (e.g., travel history, exposures)       N/a  Answer Assessment - Initial Assessment Questions 1. REASON FOR CALL or QUESTION: "What is your reason for calling today?" or "How can I best help you?" or "What question do you have that I can help answer?"     Pt initially called to schedule PFT test  with [facility], but was experiencing symptoms so PAS transferred to NT for further evaluation. Upon further investigation, pt is "winded" at baseline r/t environmental factors. Denies any acute sx at this time. [facility] does not appreciate audible SOB/wheezing during call. Pt is speaking in full sentences. [facility] will transfer back to [facility] to schedule PFT.  Protocols used: Breathing Difficulty-A-AH, Information Only Call - No Triage-A-AH

## 2024-04-21 NOTE — Telephone Encounter (Unsigned)
 Copied from CRM [id number]. Topic: General - Other >> Apr 21, 2024  2:08 PM Sophia H wrote: Reason for CRM: patient states that he had a diabetic eye exam done a few months ago, no need for one at this time.

## 2024-04-22 ENCOUNTER — Encounter: Payer: Self-pay | Admitting: Internal Medicine

## 2024-04-22 ENCOUNTER — Encounter: Payer: Self-pay | Admitting: Pulmonary Disease

## 2024-04-26 ENCOUNTER — Encounter: Payer: Self-pay | Admitting: Podiatry

## 2024-04-26 ENCOUNTER — Other Ambulatory Visit: Payer: Self-pay | Admitting: Internal Medicine

## 2024-04-26 ENCOUNTER — Ambulatory Visit: Attending: Cardiology | Admitting: Cardiology

## 2024-04-26 ENCOUNTER — Telehealth: Payer: Self-pay | Admitting: Urology

## 2024-04-26 ENCOUNTER — Encounter: Payer: Self-pay | Admitting: Urology

## 2024-04-26 ENCOUNTER — Encounter: Payer: Self-pay | Admitting: Cardiology

## 2024-04-26 VITALS — BP 122/70 | HR 67 | Ht 75.0 in | Wt 264.2 lb

## 2024-04-26 DIAGNOSIS — I10 Essential (primary) hypertension: Secondary | ICD-10-CM

## 2024-04-26 DIAGNOSIS — G4733 Obstructive sleep apnea (adult) (pediatric): Secondary | ICD-10-CM | POA: Diagnosis not present

## 2024-04-26 DIAGNOSIS — K449 Diaphragmatic hernia without obstruction or gangrene: Secondary | ICD-10-CM | POA: Diagnosis not present

## 2024-04-26 DIAGNOSIS — E11649 Type 2 diabetes mellitus with hypoglycemia without coma: Secondary | ICD-10-CM

## 2024-04-26 DIAGNOSIS — E1169 Type 2 diabetes mellitus with other specified complication: Secondary | ICD-10-CM

## 2024-04-26 DIAGNOSIS — E1140 Type 2 diabetes mellitus with diabetic neuropathy, unspecified: Secondary | ICD-10-CM

## 2024-04-26 DIAGNOSIS — R55 Syncope and collapse: Secondary | ICD-10-CM | POA: Diagnosis not present

## 2024-04-26 DIAGNOSIS — E785 Hyperlipidemia, unspecified: Secondary | ICD-10-CM

## 2024-04-26 DIAGNOSIS — R42 Dizziness and giddiness: Secondary | ICD-10-CM | POA: Diagnosis not present

## 2024-04-26 DIAGNOSIS — N1831 Chronic kidney disease, stage 3a: Secondary | ICD-10-CM

## 2024-04-26 DIAGNOSIS — I471 Supraventricular tachycardia, unspecified: Secondary | ICD-10-CM | POA: Diagnosis not present

## 2024-04-26 MED ORDER — FENOFIBRATE 145 MG PO TABS: 145.0000 mg | ORAL_TABLET | Freq: Every day | ORAL | 3 refills | Status: DC

## 2024-04-26 MED ORDER — ROSUVASTATIN CALCIUM 40 MG PO TABS: 40.0000 mg | ORAL_TABLET | Freq: Every day | ORAL | 3 refills | Status: AC

## 2024-04-26 MED ORDER — DEXCOM G7 SENSOR MISC: 5 refills | Status: DC

## 2024-04-26 NOTE — Telephone Encounter (Signed)
 Called pt to confirm Rx going to correct pharmacy lvm to c/b

## 2024-04-26 NOTE — Progress Notes (Signed)
 Cardiology Office Note:  .   Date:  04/30/2024  ID:  Elijah Shaffer [place], DOB [birth year], MRN [id number] PCP: Elijah Suzzane POUR, MD   [facility] Providers Cardiologist:  Alm Clay, MD     No chief complaint on file.   Patient Profile: .     Elijah Shaffer is a  43 y.o. 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  Referring MD: Elijah Suzzane POUR, MD.  I last saw him in Mar 25, 2022.  Noted that his palpitation issues/PSVT were well-controlled with propranolol  10 mg twice daily     Elijah Shaffer was last seen on January 01, 2023 by Robbi Elijah, [facility] for the management.  At the time of taking Lovaza , but was unclear as to why rosuvastatin  was stopped.  Had lots of health issues after having a tick bite.  Lots of fatigue.  Was doing physical therapy for back pain.  Continue on Lovaza  2 g twice daily and was started on 40 mg rosuvastatin .  Plan was to add Tricor  as well.  Also encouraged exercise. => He recently called in to refill fenofibrate .  Subjective  Discussed the use of AI scribe software for clinical note transcription with the patient, who gave verbal consent to proceed.  History of Present Illness History of Present Illness Elijah Shaffer is a 43 year old male who presents for follow-up.  His cholesterol levels have improved significantly, with the last check in March showing a total cholesterol of 108, HDL of 34, and LDL of 46. He is currently taking Crestor  40 mg and has run out of fenofibrate  (Tricor ) and needs a refill. He has a history of high triglycerides, which have improved from 987 to 168. He is aware of the dietary changes needed to manage this condition, such as reducing starchy foods.  He has a history of a hiatal hernia, which has increased in size. He experiences  symptoms such as heartburn. He was advised about the possibility of surgery but has not yet decided on this course of action.  He is on multiple medications for hypertension, including amlodipine  5 mg daily, olmesartan 5 mg daily, and propranolol  20 mg twice a day, with an option for an extra dose if needed for palpitations. He has not needed extra doses recently. No muscle aches, cramping, heart racing, or palpitations. Occasionally feels lightheaded when working in the heat but has not passed out. No chest pain, pressure, or shortness of breath during activities, although he notes difficulty breathing in hot, humid conditions.  For diabetes, he takes Jardiance  10 mg daily. His last A1c was 6.3. He experiences some foot discomfort, described as aching and numbness, but denies any sores or pain in his calves or thighs when walking.  He is experiencing fatigue, which he attributes to issues with obtaining his testosterone  medication. He is awaiting further evaluation and management from his urologist.  He does not smoke or drink alcohol. He eats fast food while working but eats at home otherwise.  Cardiovascular ROS: no chest pain or dyspnea on exertion positive for - DOE with heat/humidity with light headedness if over exerts negative for - edema, irregular heartbeat, orthopnea, palpitations, paroxysmal nocturnal dyspnea, rapid heart rate, shortness of breath, or syncope, ITA/amaurosis fugax, claudication  ROS:  Review of Systems - Negative except feet get  sore & numb    Objective   Diet:  -breakfast: everyday eats out at [employer]  -lunch: every day eat out - fast food  -dinner-  hamburger helper at home  or eats out -fast food    Exercise: none during winter months and heavy duty job during spring and summer months.    Family History: maternal GM - cancer, heart valve - had pacemaker  Maternal GF: T2DM   Social History:  Smoking: never Alcohol: once a year on his  birthday Recreational Drug use: never  Current Meds  olmesartan (BENICAR) 5 MG tablet Take 5 mg by mouth daily.    fenofibrate  (TRICOR ) 145 MG tablet Take 1 tablet (145 mg total) by mouth daily. .    JARDIANCE  10 MG TABS tablet TAKE 1 TABLET BY MOUTH ONCE DAILY BEFORE BREAKFAST    amLODipine  (NORVASC ) 5 MG tablet Take 1 tablet by mouth once daily    propranolol  (INDERAL ) 20 MG tablet Take one tablet 20 mg  twice daily  may take an  additional dose of 20 mg  as needed for palpitations   rosuvastatin  (CRESTOR ) 40 MG tablet Take 1 tablet (40 mg total) by mouth daily.        Medication Sig   b complex vitamins capsule Take 1 capsule by mouth daily. With D3;; gummie   benzonatate  (TESSALON ) 100 MG capsule Take 1 capsule (100 mg total) by mouth 3 (three) times daily as needed for cough.    capsicum (ZOSTRIX) 0.075 % topical cream Apply 1 Application topically 4 (four) times daily.   cetirizine (ZYRTEC) 10 MG tablet Take 10 mg by mouth daily.   Cholecalciferol (VITAMIN D -3 PO) Take by mouth daily at 6 (six) AM.   clobetasol  (TEMOVATE ) 0.05 % external solution Apply 1 application. topically 2 (two) times daily. Use as directed.  Mix with cerave cream. Use for up to 4 weeks.    cyclobenzaprine  (FLEXERIL ) 5 MG tablet Take 1 tablet (5 mg total) by mouth at bedtime as needed for muscle spasms.   diphenhydrAMINE -zinc  acetate (BENADRYL  EXTRA STRENGTH) cream Apply 1 application topically 3 (three) times daily as needed for itching.   docusate sodium  (COLACE) 100 MG capsule Take 100 mg by mouth 2 (two) times daily.   DULoxetine (CYMBALTA) 30 MG capsule Take 30 mg by mouth 2 (two) times daily.   fluocinonide  (LIDEX ) 0.05 % external solution Apply once or twice daily to affected areas on scalp up to 2 weeks as needed for itching.    fluticasone  (FLONASE  ALLERGY RELIEF) 50 MCG/ACT nasal spray Place 2 sprays into both nostrils daily as needed for allergies or rhinitis.   gabapentin (NEURONTIN) 300 MG  capsule Take 300 mg by mouth 3 (three) times daily.   hydrOXYzine  (ATARAX ) 25 MG tablet Take 1-2 tablets by mouth at bedtime as needed for itch.   ketoconazole  (NIZORAL ) 2 % shampoo Massage into scalp 2-3 times a week, let sit several minutes before rinsing.   lubiprostone  (AMITIZA ) 24 MCG capsule TAKE 1 CAPSULE BY MOUTH TWICE DAILY WITH A MEAL   montelukast  (SINGULAIR ) 10 MG tablet TAKE 1 TABLET BY MOUTH AT BEDTIME   omeprazole  (PRILOSEC) 40 MG capsule Take 1 capsule by mouth twice daily   ondansetron  (ZOFRAN ) 4 MG tablet Take 1 tablet (4 mg total) by mouth every 6 (six) hours as needed for nausea.   Testosterone  25 MG/2.5GM (1%) GEL Place 5 g onto the skin every morning. 1 packet to each shoulder q am.   triamcinolone   cream (KENALOG ) 0.1 %    Vitamin D , Ergocalciferol , (DRISDOL ) 1.25 MG (50000 UNIT) CAPS capsule Take 1 capsule (50,000 Units total) by mouth every 7 (seven) days.    Studies Reviewed: SABRA   EKG Interpretation Date/Time:  Monday April 26 2024 08:34:42 EDT Ventricular Rate:  67 PR Interval:  190 QRS Duration:  96 QT Interval:  378 QTC Calculation: 399 R Axis:   51  Text Interpretation: Normal sinus rhythm Normal ECG When compared with ECG of 08-Aug-2023 12:57, No significant change since last tracing Confirmed by Anner Lenis ([id number]) on 04/26/2024 8:47:52 AM    Lab Results  Component Value Date   CHOL 108 01/29/2024   HDL 34 (L) 01/29/2024   LDLCALC 46 01/29/2024   TRIG 168 (H) 01/29/2024   CHOLHDL 3.2 01/29/2024   TG much better  Lab Results  Component Value Date   NA 139 04/07/2024   K 4.1 04/07/2024   CREATININE 1.23 04/07/2024   EGFR 75 04/07/2024   GLUCOSE 125 (H) 04/07/2024   Lab Results  Component Value Date   HGBA1C 6.3 (H) 04/07/2024   Prior Studies  CPX: ASAP termination due to generalized weakness and fatigue.  Normal pulse ox.  Normal preexercise spirometry.  Limited test due to submaximal effort.  No severe functional impairment compared to  sedentary norms.  No cardiopulmonary limitation.  Suggested neuromuscular evaluation..  (01/2022) 14-day Zio patch: Predominant sinus rhythm rate ranged from 38 (24 while sleeping) to 159 bpm with average once of 74 bpm.  1  AVB and Wenckebach Block noted.  Rare PACs and PVCs.  Rare ventricular trigeminy R&L Heart. CATH: Normal coronary arteries.  Normal hemodynamics.  EF 50 to 55% by ventriculogram.  Brief run of SVT/PAT (12/12/2021)  Risk Assessment/Calculations:         Physical Exam:   VS:  BP 122/70   Pulse 67   Ht 6' 3 (1.905 m)   Wt 264 lb 3.2 oz (119.8 kg)   SpO2 97%   BMI 33.02 kg/m    Wt Readings from Last 3 Encounters:  04/26/24 264 lb 3.2 oz (119.8 kg)  04/08/24 260 lb 12.9 oz (118.3 kg)  03/24/24 260 lb 12.8 oz (118.3 kg)    GEN: ~mildly obese (but more muscular); Well nourished, well groomed in no acute distress;  NECK: No JVD; No carotid bruits CARDIAC: Normal S1, S2; RRR, no murmurs, rubs, gallops RESPIRATORY:  Clear to auscultation without rales, wheezing or rhonchi ; nonlabored, good air movement. ABDOMEN: Soft, non-tender, non-distended EXTREMITIES:  No edema; No deformity     ASSESSMENT AND PLAN: .    Problem List Items Addressed This Visit       Cardiology Problems   Essential hypertension (Chronic)   Blood pressure well-controlled  with current medications. No recent palpitations or need for extra propranolol  dosing. - Continue amlodipine  5 mg daily, and olmesartan 5 mg daily. - Continue propranolol  20 mg twice daily with option for extra dose if needed for palpitations.      Relevant Medications   rosuvastatin  (CRESTOR ) 40 MG tablet   fenofibrate  (TRICOR ) 145 MG tablet   Hyperlipidemia associated with type 2 diabetes mellitus (HCC) (Chronic)   Well-managed with current medications. Total cholesterol 108, HDL 34, LDL 46. Triglycerides improved from 987 to 168. - Refill Crestor  40 mg. - Refill Tricor  145 mg daily (fenofibrate ).  Type 2 diabetes  mellitus Well-controlled with Jardiance  monotherapy. Hemoglobin A1c is 6.3%. - Continue on Jardiance  10 mg  Relevant Medications   rosuvastatin  (CRESTOR ) 40 MG tablet   fenofibrate  (TRICOR ) 145 MG tablet   Postural dizziness with presyncope (Chronic)   Doing much better with adequate hydration.  Stressed importance of making sure that he stays hydrated while on Jardiance .      Relevant Medications   rosuvastatin  (CRESTOR ) 40 MG tablet   fenofibrate  (TRICOR ) 145 MG tablet   Other Relevant Orders   EKG 12-Lead (Completed)   PSVT (paroxysmal supraventricular tachycardia) (HCC) - Primary (Chronic)   Palpitations been well-controlled.  No breakthrough episodes on current dose of Inderal /propranolol  -Continue propranolol  20 mg twice daily with additional 20 mg tablet as needed breakthrough spells. -Reviewed vagal maneuvers.      Relevant Medications   rosuvastatin  (CRESTOR ) 40 MG tablet   fenofibrate  (TRICOR ) 145 MG tablet   Other Relevant Orders   EKG 12-Lead (Completed)     Other   Hiatal hernia   Increased in size. Discussed potential surgical intervention to prevent complications. Decision pending further specialist consultation.  In the absence of any active cardiac symptoms, would not require any further cardiology evaluation if he did need surgery.  No medications need to be held.  He would be a low risk patient for low risk surgery with normal renal function, and diabetes not on insulin.  No active angina or heart failure symptoms, no history of stroke.       OSA (obstructive sleep apnea) (Chronic)   Continue CPAP.  Followed by Pulmonary Medicine      Type 2 diabetes mellitus with diabetic neuropathy, without long-term current use of insulin (HCC)   Aching and numbness in feet, likely secondary to diabetes. No sores or significant complications.  Thankfully, his diabetic control is improved.      Relevant Medications   rosuvastatin  (CRESTOR ) 40 MG tablet         Follow-Up: Return in about 1 year (around 04/26/2025) for Alternating annual follow-ups APP and MD.     Signed, Alm MICAEL Clay, MD, MS Alm Clay, M.D., M.S. Interventional Chartered certified accountant  Pager # [phone]

## 2024-04-26 NOTE — Telephone Encounter (Signed)
 Patient called again regarding his testosterone  refill, it has not been called in .

## 2024-04-26 NOTE — Patient Instructions (Signed)
 Medication Instructions:  No changes *If you need a refill on your cardiac medications before your next appointment, please call your pharmacy*   Lab Work: Not needed   Testing/Procedures:  Not needed Follow-Up: At [facility], you and your health needs are our priority.  As part of our continuing mission to provide you with exceptional heart care, we have created designated Provider Care Teams.  These Care Teams include your primary Cardiologist (physician) and Advanced Practice Providers (APPs -  Physician Assistants and Nurse Practitioners) who all work together to provide you with the care you need, when you need it.     Your next appointment:   12 month(s)  The format for your next appointment:   In Person  Provider:   Bryan Lemma, MD

## 2024-04-27 DIAGNOSIS — I471 Supraventricular tachycardia, unspecified: Secondary | ICD-10-CM | POA: Diagnosis not present

## 2024-04-27 DIAGNOSIS — F419 Anxiety disorder, unspecified: Secondary | ICD-10-CM | POA: Diagnosis not present

## 2024-04-27 DIAGNOSIS — E785 Hyperlipidemia, unspecified: Secondary | ICD-10-CM | POA: Diagnosis not present

## 2024-04-27 DIAGNOSIS — E669 Obesity, unspecified: Secondary | ICD-10-CM | POA: Diagnosis not present

## 2024-04-27 DIAGNOSIS — Z809 Family history of malignant neoplasm, unspecified: Secondary | ICD-10-CM | POA: Diagnosis not present

## 2024-04-27 DIAGNOSIS — F320 Major depressive disorder, single episode, mild: Secondary | ICD-10-CM | POA: Diagnosis not present

## 2024-04-27 DIAGNOSIS — K219 Gastro-esophageal reflux disease without esophagitis: Secondary | ICD-10-CM | POA: Diagnosis not present

## 2024-04-27 DIAGNOSIS — I129 Hypertensive chronic kidney disease with stage 1 through stage 4 chronic kidney disease, or unspecified chronic kidney disease: Secondary | ICD-10-CM | POA: Diagnosis not present

## 2024-04-27 DIAGNOSIS — I25119 Atherosclerotic heart disease of native coronary artery with unspecified angina pectoris: Secondary | ICD-10-CM | POA: Diagnosis not present

## 2024-04-27 DIAGNOSIS — Z008 Encounter for other general examination: Secondary | ICD-10-CM | POA: Diagnosis not present

## 2024-04-27 DIAGNOSIS — Z8249 Family history of ischemic heart disease and other diseases of the circulatory system: Secondary | ICD-10-CM | POA: Diagnosis not present

## 2024-04-27 DIAGNOSIS — Z833 Family history of diabetes mellitus: Secondary | ICD-10-CM | POA: Diagnosis not present

## 2024-04-27 DIAGNOSIS — Z7984 Long term (current) use of oral hypoglycemic drugs: Secondary | ICD-10-CM | POA: Diagnosis not present

## 2024-04-30 DIAGNOSIS — K449 Diaphragmatic hernia without obstruction or gangrene: Secondary | ICD-10-CM | POA: Insufficient documentation

## 2024-04-30 NOTE — Assessment & Plan Note (Signed)
 Palpitations been well-controlled.  No breakthrough episodes on current dose of Inderal /propranolol  -Continue propranolol  20 mg twice daily with additional 20 mg tablet as needed breakthrough spells. -Reviewed vagal maneuvers.

## 2024-04-30 NOTE — Assessment & Plan Note (Signed)
 Continue CPAP.  Followed by Pulmonary Medicine

## 2024-04-30 NOTE — Assessment & Plan Note (Signed)
 Blood pressure well-controlled  with current medications. No recent palpitations or need for extra propranolol  dosing. - Continue amlodipine  5 mg daily, and olmesartan 5 mg daily. - Continue propranolol  20 mg twice daily with option for extra dose if needed for palpitations.

## 2024-04-30 NOTE — Assessment & Plan Note (Signed)
 Well-managed with current medications. Total cholesterol 108, HDL 34, LDL 46. Triglycerides improved from 987 to 168. - Refill Crestor  40 mg. - Refill Tricor  145 mg daily (fenofibrate ).  Type 2 diabetes mellitus Well-controlled with Jardiance  monotherapy. Hemoglobin A1c is 6.3%. - Continue on Jardiance  10 mg

## 2024-04-30 NOTE — Assessment & Plan Note (Signed)
 Aching and numbness in feet, likely secondary to diabetes. No sores or significant complications.  Thankfully, his diabetic control is improved.

## 2024-04-30 NOTE — Assessment & Plan Note (Signed)
 Increased in size. Discussed potential surgical intervention to prevent complications. Decision pending further specialist consultation.  In the absence of any active cardiac symptoms, would not require any further cardiology evaluation if he did need surgery.  No medications need to be held.  He would be a low risk patient for low risk surgery with normal renal function, and diabetes not on insulin.  No active angina or heart failure symptoms, no history of stroke.

## 2024-04-30 NOTE — Assessment & Plan Note (Signed)
 Doing much better with adequate hydration.  Stressed importance of making sure that he stays hydrated while on Jardiance .

## 2024-05-06 ENCOUNTER — Encounter: Payer: Self-pay | Admitting: Podiatry

## 2024-05-10 ENCOUNTER — Encounter (INDEPENDENT_AMBULATORY_CARE_PROVIDER_SITE_OTHER): Payer: Self-pay

## 2024-05-11 ENCOUNTER — Encounter: Payer: Self-pay | Admitting: Internal Medicine

## 2024-05-13 ENCOUNTER — Ambulatory Visit (INDEPENDENT_AMBULATORY_CARE_PROVIDER_SITE_OTHER): Admitting: Gastroenterology

## 2024-05-13 ENCOUNTER — Encounter (INDEPENDENT_AMBULATORY_CARE_PROVIDER_SITE_OTHER): Payer: Self-pay | Admitting: Gastroenterology

## 2024-05-13 ENCOUNTER — Ambulatory Visit: Payer: 59 | Admitting: Urology

## 2024-05-13 VITALS — BP 110/71 | HR 65 | Temp 97.7°F | Ht 75.0 in | Wt 264.3 lb

## 2024-05-13 DIAGNOSIS — R140 Abdominal distension (gaseous): Secondary | ICD-10-CM | POA: Diagnosis not present

## 2024-05-13 DIAGNOSIS — K7581 Nonalcoholic steatohepatitis (NASH): Secondary | ICD-10-CM | POA: Diagnosis not present

## 2024-05-13 DIAGNOSIS — K581 Irritable bowel syndrome with constipation: Secondary | ICD-10-CM | POA: Diagnosis not present

## 2024-05-13 DIAGNOSIS — K429 Umbilical hernia without obstruction or gangrene: Secondary | ICD-10-CM | POA: Diagnosis not present

## 2024-05-13 DIAGNOSIS — K219 Gastro-esophageal reflux disease without esophagitis: Secondary | ICD-10-CM | POA: Diagnosis not present

## 2024-05-13 NOTE — Progress Notes (Addendum)
 Referring Provider: Tobie Suzzane POUR, MD Primary Care Physician:  Tobie Suzzane POUR, MD Primary GI Physician: Dr. Eartha   Chief Complaint  Patient presents with   Russella Mask Issues    Pt arrives due to knot on right side of belly button. Pt states he noticed it the other day. Sometimes will have a line in the area.    HPI:   Elijah Shaffer is a 43 y.o. male with past medical history of GERD complicated by long segment nondysplastic Barrett's esophagus, history of diverticulitis and anxiety,  OSA, large hiatal hernia, polyneuropathy, IBS-C   Patient presenting today for:  Umbilical hernia Follow up of bloating GERD IBS with Constipation NASH  Last seen December, at that time on amitiza  8mcg BID, still straining some to go. Seeing Dr. Michelina in January to discuss [facility] repair  Recommended increase amitiza  to 24mcg BID, SIBO testing, follow up with GEn surg  SIBO testing in dec 2024 was negative  Present: Patient states he noticed a lesion near his umbilicus a few days ago. No real pain there. He notes some continued bloating and the area gets a little firmer at that time. He is having a BM 1-2 times per day on amitiza  24mcg BID. He denies rectal bleeding or melena. Appetite is good. No nausea or vomiting. He has upcoming follow up with general surgeon regarding Hiatal hernia. Having more GERD symptoms over the past few days with more belching, he is on prilosec 40mg  BID. Denies any dysphagia. Not taking anything PRN when symptoms occur.   CMP and CBC in may 2025 were unremarkable with normal LFTs and platelet count  Ultrasound liver elastography in September with median K PA 2.5, diffuse hepatic steatosis no overt changes of cirrhosis, trace perihepatic ascites, gallbladder sludge  Patient underwent CT abdomen pelvis with contrast x 2 in September 2024 which again showed diverticulosis, hepatic steatosis, sludge or stone in gallbladder neck, no cholecystitis or biliary ductal  dilatation, large hiatal hernia. upper GI series on 12/4 with normal esophagus, normal esophageal motility, no spontaneous GERD moderate to large sized type III paraesophageal hernia, duodenal tiny duodenal diverticulum Last Colonoscopy:05/2021 -  The perianal and digital rectal examinations were normal. Multiple large-mouthed diverticula were found in the sigmoid colon, transverse colon, ascending colon and cecum. Non-bleeding internal hemorrhoids were found during retroflexion. The  hemorrhoids were small.   Recommended to repeat in 5 years due to family history of cancer. Last Endoscopy:October- Esophageal mucosal changes classified as Barrett's stage C6-M6 per Prague criteria. - 8 cm paraesophageal hernia.- Duodenal diverticulum.     Filed Weights   05/13/24 0842  Weight: 264 lb 4.8 oz (119.9 kg)     Past Medical History:  Diagnosis Date   Acute renal injury (HCC) 06/28/2017   Anxiety    Aspiration pneumonia of right upper lobe due to gastric secretions (HCC)    Diabetes mellitus without complication (HCC)    Diverticulitis    GERD (gastroesophageal reflux disease) 01/08/2018   Hypertension    Prediabetes    Seasonal allergies    Sleep apnea    Syncope 06/26/2021    Past Surgical History:  Procedure Laterality Date   BIOPSY  03/18/2021   Procedure: BIOPSY;  Surgeon: Eartha Angelia Sieving, MD;  [facility];  Service: Gastroenterology;;  esophageal at Z-line   BIOPSY  05/23/2021   Procedure: BIOPSY;  Surgeon: Eartha Angelia Sieving, MD;  [facility];  Service: Gastroenterology;;   BIOPSY  08/19/2023  Procedure: BIOPSY;  Surgeon: Eartha Flavors, Toribio, MD;  [facility];  Service: Gastroenterology;;   CARDIOPULMONARY EXERCISE TEST (CPX)  01/09/2022   Interpretation limited by submaximal effort.  Severe functional impairment when compared to max sedentary norms.  No cardiopulmonary limitations.  Noted tremor and generalized  weakness that lead to premature exercise termination-consider neuromuscular evaluation.   COLONOSCOPY N/A 09/22/2017   Procedure: COLONOSCOPY;  Surgeon: Golda Claudis PENNER, MD;  [facility];  Service: Endoscopy;  Laterality: N/A;  9:55   COLONOSCOPY WITH PROPOFOL  N/A 05/23/2021   Procedure: COLONOSCOPY WITH PROPOFOL ;  Surgeon: Eartha Flavors Toribio, MD;  [facility];  Service: Gastroenterology;  Laterality: N/A;  7:30   ESOPHAGOGASTRODUODENOSCOPY (EGD) WITH PROPOFOL  N/A 03/18/2021   Procedure: ESOPHAGOGASTRODUODENOSCOPY (EGD) WITH PROPOFOL ;  Surgeon: Eartha Flavors Toribio, MD;  [facility];  Service: Gastroenterology;  Laterality: N/A;   ESOPHAGOGASTRODUODENOSCOPY (EGD) WITH PROPOFOL  N/A 05/23/2021   Procedure: ESOPHAGOGASTRODUODENOSCOPY (EGD) WITH PROPOFOL ;  Surgeon: Eartha Flavors Toribio, MD;  [facility];  Service: Gastroenterology;  Laterality: N/A;   ESOPHAGOGASTRODUODENOSCOPY (EGD) WITH PROPOFOL  N/A 08/19/2023   Procedure: ESOPHAGOGASTRODUODENOSCOPY (EGD) WITH PROPOFOL ;  Surgeon: Eartha Flavors Toribio, MD;  [facility];  Service: Gastroenterology;  Laterality: N/A;  11:45AM;ASA 3   ESOPHAGOGASTRODUODENOSCOPY (EGD) WITH PROPOFOL  N/A 10/08/2023   Procedure: ESOPHAGOGASTRODUODENOSCOPY (EGD) WITH PROPOFOL ;  Surgeon: Eartha Flavors Toribio, MD;  [facility];  Service: Gastroenterology;  Laterality: N/A;  1:45PM;ASA 1-2   GIVENS CAPSULE STUDY N/A 10/08/2023   Procedure: GIVENS CAPSULE STUDY;  Surgeon: Eartha Flavors Toribio, MD;  [facility];  Service: Gastroenterology;  Laterality: N/A;  1:45PM;ASA 1-2   POLYPECTOMY  09/22/2017   Procedure: POLYPECTOMY;  Surgeon: Golda Claudis PENNER, MD;  [facility];  Service: Endoscopy;;   RIGHT/LEFT HEART CATH AND CORONARY ANGIOGRAPHY N/A 12/12/2021   Procedure: RIGHT/LEFT HEART CATH AND CORONARY ANGIOGRAPHY;  Surgeon: Cherrie Toribio SAUNDERS, MD;   [facility]: [facility];  Service: Cardiovascular:: Normal Coronaries & Hemodynamics.  EF 50-55%. RA = 4 mmHg, RV = 23/4; PA = 22/6 (14) & PCW = 7; Ao sat = 95%; PA sat = 76%, 76%& SVC sat = 74%Fick cardiac output/index = 7.1/3.0; PVR = 1.0 WU   TRANSTHORACIC ECHOCARDIOGRAM  02/24/2021   EF 60 to 65%.  Normal LV function.  Normal valves.  Mild RV dilation with normal function.   ZIO PATCH EVENT MONITOR  01/2022   Sinus rhythm with heart rate range 24 to 159 bpm, 1  AVB and Wenckebach block noted along with rare PACs and PVCs.  No arrhythmias.    Current Outpatient Medications  Medication Sig Dispense Refill   amLODipine  (NORVASC ) 5 MG tablet Take 1 tablet by mouth once daily 90 tablet 0   b complex vitamins capsule Take 1 capsule by mouth daily. With D3 gummie     benzonatate  (TESSALON ) 100 MG capsule Take 1 capsule (100 mg total) by mouth 3 (three) times daily as needed for cough. Do not take with alcohol or while operating or driving heavy machinery 21 capsule 0   Blood Glucose Monitoring Suppl DEVI 1 each by Does not apply route in the morning, at noon, and at bedtime. May substitute to any manufacturer covered by patient's insurance. 1 each 0   capsicum (ZOSTRIX) 0.075 % topical cream Apply 1 Application topically 4 (four) times daily. 57 g 0   cetirizine (ZYRTEC) 10 MG tablet Take 10 mg by mouth daily.  Cholecalciferol (VITAMIN D -3 PO) Take by mouth daily at 6 (six) AM.     clindamycin  (CLEOCIN  T) 1 % external solution Apply topically to acne bumps at aa's qd 30 mL 3   clindamycin  (CLEOCIN -T) 1 % lotion Apply to affected areas of bumps once daily after shower. 60 mL 3   clobetasol  (TEMOVATE ) 0.05 % external solution Apply 1 application. topically 2 (two) times daily. Use as directed.  Mix with cerave cream. Use for up to 4 weeks. Avoid applying to face, groin, and axilla. Use as directed. 50 mL 1   clobetasol  cream (TEMOVATE ) 0.05 % Spot treat more severe areas once to twice daily  until improved. Avoid applying to face, groin, and axilla. Use as directed. Long-term use can cause thinning of the skin. 60 g 3   Continuous Glucose Sensor (DEXCOM G7 SENSOR) MISC Use it to check blood glucose 3 times before meals, at bedtime and as needed. 3 each 5   Continuous Glucose Transmitter (DEXCOM G6 TRANSMITTER) MISC Use to check blood sugar daily DX e11.65 1 each 3   cyclobenzaprine  (FLEXERIL ) 5 MG tablet Take 1 tablet (5 mg total) by mouth at bedtime as needed for muscle spasms. 30 tablet 1   diphenhydrAMINE -zinc  acetate (BENADRYL  EXTRA STRENGTH) cream Apply 1 application topically 3 (three) times daily as needed for itching. 28.4 g 0   docusate sodium  (COLACE) 100 MG capsule Take 100 mg by mouth 2 (two) times daily.     DULoxetine (CYMBALTA) 30 MG capsule Take 30 mg by mouth 2 (two) times daily.     fenofibrate  (TRICOR ) 145 MG tablet Take 1 tablet (145 mg total) by mouth daily. 90 tablet 3   fluocinonide  (LIDEX ) 0.05 % external solution Apply once or twice daily to affected areas on scalp up to 2 weeks as needed for itching. Avoid applying to face, groin, and axilla. 60 mL 2   fluticasone  (FLONASE  ALLERGY RELIEF) 50 MCG/ACT nasal spray Place 2 sprays into both nostrils daily as needed for allergies or rhinitis. 16 mL 2   gabapentin (NEURONTIN) 300 MG capsule Take 300 mg by mouth 3 (three) times daily.     Glucose Blood (BLOOD GLUCOSE TEST STRIPS) STRP 1 each by In Vitro route in the morning, at noon, and at bedtime. May substitute to any manufacturer covered by patient's insurance. 100 strip 1   hydrOXYzine  (ATARAX ) 25 MG tablet Take 1-2 tablets by mouth at bedtime as needed for itch. 60 tablet 5   hydrOXYzine  (VISTARIL ) 25 MG capsule TAKE 1 TO 2 CAPSULES BY MOUTH AT BEDTIME FOR ITCHING (  CAN  CAUSE  DROWSINESS) 60 capsule 0   JARDIANCE  10 MG TABS tablet TAKE 1 TABLET BY MOUTH ONCE DAILY BEFORE BREAKFAST 60 tablet 0   ketoconazole  (NIZORAL ) 2 % shampoo Massage into scalp 2-3 times a  week, let sit several minutes before rinsing. 120 mL 11   lubiprostone  (AMITIZA ) 24 MCG capsule TAKE 1 CAPSULE BY MOUTH TWICE DAILY WITH A MEAL 180 capsule 1   montelukast  (SINGULAIR ) 10 MG tablet TAKE 1 TABLET BY MOUTH AT BEDTIME 90 tablet 3   olmesartan (BENICAR) 5 MG tablet Take 5 mg by mouth daily.     omeprazole  (PRILOSEC) 40 MG capsule Take 1 capsule by mouth twice daily 180 capsule 0   ondansetron  (ZOFRAN ) 4 MG tablet Take 1 tablet (4 mg total) by mouth every 6 (six) hours as needed for nausea. 20 tablet 0   propranolol  (INDERAL ) 20 MG tablet Take one tablet  20 mg  twice daily  may take an  additional dose of 20 mg  as needed for palpitations 190 tablet 3   rosuvastatin  (CRESTOR ) 40 MG tablet Take 1 tablet (40 mg total) by mouth daily. 90 tablet 3   Testosterone  25 MG/2.5GM (1%) GEL Place 5 g onto the skin every morning. 1 packet to each shoulder q am. 150 g 5   triamcinolone  cream (KENALOG ) 0.1 %      Vitamin D , Ergocalciferol , (DRISDOL ) 1.25 MG (50000 UNIT) CAPS capsule Take 1 capsule (50,000 Units total) by mouth every 7 (seven) days. 20 capsule 1   No current facility-administered medications for this visit.    Allergies as of 05/13/2024 - Review Complete 05/13/2024  Allergen Reaction Noted   Gramineae pollens Itching 05/10/2022   Claritin [loratadine] Other (See Comments) 02/08/2017   Loratadine-pseudoephedrine er Palpitations 01/30/2022   Pollen extract Other (See Comments) 06/26/2021    Social History   Socioeconomic History   Marital status: Single    Spouse name: Not on file   Number of children: Not on file   Years of education: Not on file   Highest education level: Not on file  Occupational History   Not on file  Tobacco Use   Smoking status: Never    Passive exposure: Past   Smokeless tobacco: Never  Vaping Use   Vaping status: Never Used  Substance and Sexual Activity   Alcohol use: Not Currently    Comment: occasionally   Drug use: No   Sexual  activity: Not on file  Other Topics Concern   Not on file  Social History Narrative   He currently has his own lawn care landscaping service. He previous had 80 yards before he got sick and since decreased. He works around a Interior and spatial designer. One of the chemical names is roundup.    Social Drivers of Corporate investment banker Strain: Not on file  Food Insecurity: No Food Insecurity (06/20/2023)   Hunger Vital Sign    Worried About Running Out of Food in the Last Year: Never true    Ran Out of Food in the Last Year: Never true  Transportation Needs: No Transportation Needs (12/27/2022)   PRAPARE - Administrator, Civil Service (Medical): No    Lack of Transportation (Non-Medical): No  Physical Activity: Insufficiently Active (03/29/2022)   Exercise Vital Sign    Days of Exercise per Week: 2 days    Minutes of Exercise per Session: 10 min  Stress: Stress Concern Present (05/07/2022)   [employer] of Occupational Health - Occupational Stress Questionnaire    Feeling of Stress : To some extent  Social Connections: Not on file    Review of systems General: negative for malaise, night sweats, fever, chills, weight loss Neck: Negative for lumps, goiter, pain and significant neck swelling Resp: Negative for cough, wheezing, dyspnea at rest CV: Negative for chest pain, leg swelling, palpitations, orthopnea GI: denies melena, hematochezia, nausea, vomiting, diarrhea, constipation, dysphagia, odyonophagia, early satiety or unintentional weight loss. +belching +bloating +umbilical hernia  MSK: Negative for joint pain or swelling, back pain, and muscle pain. Derm: Negative for itching or rash Psych: Denies depression, anxiety, memory loss, confusion. No homicidal or suicidal ideation.  Heme: Negative for prolonged bleeding, bruising easily, and swollen nodes. Endocrine: Negative for cold or heat intolerance, polyuria, polydipsia and goiter. Neuro: negative for tremor, gait  imbalance, syncope and seizures. The remainder of the review of systems is noncontributory.  Physical Exam: BP 110/71   Pulse 65   Temp 97.7 F (36.5 C)   Ht 6' 3 (1.905 m)   Wt 264 lb 4.8 oz (119.9 kg)   BMI 33.04 kg/m  General:   Alert and oriented. No distress noted. Pleasant and cooperative.  Head:  Normocephalic and atraumatic. Eyes:  Conjuctiva clear without scleral icterus. Mouth:  Oral mucosa pink and moist. Good dentition. No lesions. Heart: Normal rate and rhythm, s1 and s2 heart sounds present.  Lungs: Clear lung sounds in all lobes. Respirations equal and unlabored. Abdomen:  +BS, soft, non-tender and non-distended. No rebound or guarding. No HSM or masses noted. Small umbilical hernia appreciated, soft, reduceable, no discoloration at site.  Derm: No palmar erythema or jaundice Msk:  Symmetrical without gross deformities. Normal posture. Extremities:  Without edema. Neurologic:  Alert and  oriented x4 Psych:  Alert and cooperative. Normal mood and affect.  Invalid input(s): 6 MONTHS   ASSESSMENT: KYPTON ELTRINGHAM is a 43 y.o. male presenting today Umbilical hernia, Follow up of bloating, GERD IBS with Constipation, NASH  Umbilical hernia: patient noted lesion near umbilicus a few days ago. Appears to have a small umbilical hernia that is soft. He denies any real pain here. We discussed these are generally fine to be left alone, however, If this becomes larger, painful or discolored he should let me know. I did recommend he mention this to Dr. Michelina at follow up about hiatal hernia repair.  NASH: Elastography as above, most recent LFTs WNL, platelet count normal. He need to continue with good weight management, routine exercise.   GERD/bloating: on omeprazole  40mg  BID. Having some belching recently. Still with some bloating, gas. SIBO testing negative in December. At this time, will continue PPI BID, can take otc phazyme or beano and will do sucrase breath testing  to rule out CSID.  IBS with constipation: constipation well managed with amitiza  24mcg BID. Will continue with current regimen, good water  intake, high fiber diet    PLAN:  -can discuss hernia with Dr. Michelina at f/u -phazyme or beano otc  -continue omeprazole  40mg  BID -Continue amitiza  24mcg BID -sucrase breath test -good weight management, mediterranean diet   All questions were answered, patient verbalized understanding and is in agreement with plan as outlined above.   Follow Up: 6 months   Kayde Warehime L. Mariette, MSN, APRN, AGNP-C Adult-Gerontology Nurse Practitioner [facility]  I have reviewed the note and agree with the APP's assessment as described in this progress note  Toribio Fortune, MD Gastroenterology and Hepatology [facility]

## 2024-05-13 NOTE — Patient Instructions (Signed)
 Continue omeprazole  40mg  twice daily You can try taking over the counter phazyme, or beano for gas, bloating As discussed, the lesion near your belly button is a small hernia, generally these are fine left alone, you could mention this to your surgeon when you follow up about your hiatal hernia We will do sucrase breath testing given your ongoing bloating Continue amitiza  24mcg twice daily Increase water  intake, aim for atleast 64 oz per day Increase fruits, veggies and whole grains, kiwi and prunes are especially good for constipation  Follow up 6 months  It was a pleasure to see you today. I want to create trusting relationships with patients and provide genuine, compassionate, and quality care. I truly value your feedback! please be on the lookout for a survey regarding your visit with me today. I appreciate your input about our visit and your time in completing this!    Damara Klunder L. Casi Westerfeld, MSN, APRN, AGNP-C Adult-Gerontology Nurse Practitioner [facility]

## 2024-05-17 ENCOUNTER — Encounter: Payer: Self-pay | Admitting: Dermatology

## 2024-05-17 ENCOUNTER — Telehealth: Payer: Self-pay

## 2024-05-17 ENCOUNTER — Ambulatory Visit: Admitting: Internal Medicine

## 2024-05-17 ENCOUNTER — Ambulatory Visit: Payer: Self-pay

## 2024-05-17 ENCOUNTER — Ambulatory Visit: Admitting: Urology

## 2024-05-17 ENCOUNTER — Encounter: Payer: Self-pay | Admitting: Pulmonary Disease

## 2024-05-17 ENCOUNTER — Encounter: Payer: Self-pay | Admitting: Internal Medicine

## 2024-05-17 VITALS — BP 120/83 | HR 68 | Ht 75.0 in | Wt 263.8 lb

## 2024-05-17 DIAGNOSIS — E1140 Type 2 diabetes mellitus with diabetic neuropathy, unspecified: Secondary | ICD-10-CM

## 2024-05-17 DIAGNOSIS — E291 Testicular hypofunction: Secondary | ICD-10-CM

## 2024-05-17 DIAGNOSIS — G4733 Obstructive sleep apnea (adult) (pediatric): Secondary | ICD-10-CM

## 2024-05-17 NOTE — Patient Instructions (Addendum)
 Please follow up with Pulmonology for sleep apnea management. Please continue to use CPAP regularly for now.  Please follow up with Podiatrist for diabetic shoes.  Please continue to take medications as prescribed.  Please continue to follow low carb diet and perform moderate exercise/walking at least 150 mins/week.

## 2024-05-17 NOTE — Telephone Encounter (Signed)
 05/31/2024 -- Two visits at two different locations for his PFT test and OV---Patient states that he would like both of these in [place] if possible.  Acute visit made for this Thursday 05/20/2024 with Dr Malka at 1:15 PM  Patient states that his CPAP mask is not fitting correctly and he is going to bring it to his appointment.  Patient is advised that if anything worsens to go to the Emergency Room. Patient verbalized understanding.

## 2024-05-17 NOTE — Telephone Encounter (Signed)
 Returned patient's call with no answer.  Left voicemail informing patient to return call or contact his pharmacy for dosing questions.  Call back number provided.

## 2024-05-17 NOTE — Progress Notes (Signed)
 Acute Office Visit  Subjective:    Patient ID: Elijah Shaffer, male    DOB: 1981/04/16, 43 y.o.   MRN: [id number]  Chief Complaint  Patient presents with   Foot Pain    Pt reports sx of pain on bottom right foot.    Fatigue    Pt reports extreme fatigue.     HPI Patient is in today for c/o right foot pain, which is constant, worse with walking. Of note, he has type 2 DM with neuropathy. He is supposed to get a new diabetic shoe and has appointment with Podiatrist for it. He has shoe insert, but has limited help with it.  He also reports fatigue and hypersomnolence. He has CPAP device, but has suffocation with it. He has appointment with pulmonology/sleep clinic in [place].  Denies any recent weight loss, chronic cough, hemoptysis or LAD.  His CMP and thyroid  function tests were overall unremarkable in 05/25.  Past Medical History:  Diagnosis Date   Acute renal injury (HCC) 06/28/2017   Anxiety    Aspiration pneumonia of right upper lobe due to gastric secretions (HCC)    Diabetes mellitus without complication (HCC)    Diverticulitis    GERD (gastroesophageal reflux disease) 01/08/2018   Hypertension    Prediabetes    Seasonal allergies    Sleep apnea    Syncope 06/26/2021    Past Surgical History:  Procedure Laterality Date   BIOPSY  03/18/2021   Procedure: BIOPSY;  Surgeon: Eartha Angelia Sieving, MD;  [facility]: [facility];  Service: Gastroenterology;;  esophageal at Z-line   BIOPSY  05/23/2021   Procedure: BIOPSY;  Surgeon: Eartha Angelia Sieving, MD;  [facility]: [facility];  Service: Gastroenterology;;   BIOPSY  08/19/2023   Procedure: BIOPSY;  Surgeon: Eartha Angelia Sieving, MD;  [facility]: [facility];  Service: Gastroenterology;;   CARDIOPULMONARY EXERCISE TEST (CPX)  01/09/2022   Interpretation limited by submaximal effort.  Severe functional impairment when compared to max sedentary norms.  No cardiopulmonary limitations.  Noted tremor and  generalized weakness that lead to premature exercise termination-consider neuromuscular evaluation.   COLONOSCOPY N/A 09/22/2017   Procedure: COLONOSCOPY;  Surgeon: Golda Claudis PENNER, MD;  [facility]: [facility];  Service: Endoscopy;  Laterality: N/A;  9:55   COLONOSCOPY WITH PROPOFOL  N/A 05/23/2021   Procedure: COLONOSCOPY WITH PROPOFOL ;  Surgeon: Eartha Angelia Sieving, MD;  [facility]: [facility];  Service: Gastroenterology;  Laterality: N/A;  7:30   ESOPHAGOGASTRODUODENOSCOPY (EGD) WITH PROPOFOL  N/A 03/18/2021   Procedure: ESOPHAGOGASTRODUODENOSCOPY (EGD) WITH PROPOFOL ;  Surgeon: Eartha Angelia Sieving, MD;  [facility];  Service: Gastroenterology;  Laterality: N/A;   ESOPHAGOGASTRODUODENOSCOPY (EGD) WITH PROPOFOL  N/A 05/23/2021   Procedure: ESOPHAGOGASTRODUODENOSCOPY (EGD) WITH PROPOFOL ;  Surgeon: Eartha Angelia Sieving, MD;  [facility];  Service: Gastroenterology;  Laterality: N/A;   ESOPHAGOGASTRODUODENOSCOPY (EGD) WITH PROPOFOL  N/A 08/19/2023   Procedure: ESOPHAGOGASTRODUODENOSCOPY (EGD) WITH PROPOFOL ;  Surgeon: Eartha Angelia Sieving, MD;  [facility];  Service: Gastroenterology;  Laterality: N/A;  11:45AM;ASA 3   ESOPHAGOGASTRODUODENOSCOPY (EGD) WITH PROPOFOL  N/A 10/08/2023   Procedure: ESOPHAGOGASTRODUODENOSCOPY (EGD) WITH PROPOFOL ;  Surgeon: Eartha Angelia Sieving, MD;  [facility];  Service: Gastroenterology;  Laterality: N/A;  1:45PM;ASA 1-2   GIVENS CAPSULE STUDY N/A 10/08/2023   Procedure: GIVENS CAPSULE STUDY;  Surgeon: Eartha Angelia Sieving, MD;  [facility]: [facility];  Service: Gastroenterology;  Laterality: N/A;  1:45PM;ASA 1-2   POLYPECTOMY  09/22/2017   Procedure: POLYPECTOMY;  Surgeon:  Golda Claudis PENNER, MD;  [facility];  Service: Endoscopy;;   RIGHT/LEFT HEART CATH AND CORONARY ANGIOGRAPHY N/A 12/12/2021   Procedure: RIGHT/LEFT HEART CATH AND CORONARY ANGIOGRAPHY;  Surgeon: Cherrie Toribio SAUNDERS,  MD;  Location: [facility];  Service: Cardiovascular:: Normal Coronaries & Hemodynamics.  EF 50-55%. RA = 4 mmHg, RV = 23/4; PA = 22/6 (14) & PCW = 7; Ao sat = 95%; PA sat = 76%, 76%& SVC sat = 74%Fick cardiac output/index = 7.1/3.0; PVR = 1.0 WU   TRANSTHORACIC ECHOCARDIOGRAM  02/24/2021   EF 60 to 65%.  Normal LV function.  Normal valves.  Mild RV dilation with normal function.   ZIO PATCH EVENT MONITOR  01/2022   Sinus rhythm with heart rate range 24 to 159 bpm, 1  AVB and Wenckebach block noted along with rare PACs and PVCs.  No arrhythmias.    Family History  Problem Relation Age of Onset   Healthy Mother    Cervical cancer Mother    Cancer Father    Colon cancer Maternal Grandmother    Colon cancer Maternal Grandfather     Social History   Socioeconomic History   Marital status: Single    Spouse name: Not on file   Number of children: Not on file   Years of education: Not on file   Highest education level: Not on file  Occupational History   Not on file  Tobacco Use   Smoking status: Never    Passive exposure: Past   Smokeless tobacco: Never  Vaping Use   Vaping status: Never Used  Substance and Sexual Activity   Alcohol use: Not Currently    Comment: occasionally   Drug use: No   Sexual activity: Not on file  Other Topics Concern   Not on file  Social History Narrative   He currently has his own lawn care landscaping service. He previous had 80 yards before he got sick and since decreased. He works around a Interior and spatial designer. One of the chemical names is roundup.    Social Drivers of Corporate investment banker Strain: Not on file  Food Insecurity: No Food Insecurity (06/20/2023)   Hunger Vital Sign    Worried About Running Out of Food in the Last Year: Never true    Ran Out of Food in the Last Year: Never true  Transportation Needs: No Transportation Needs (12/27/2022)   PRAPARE - Administrator, Civil Service (Medical): No    Lack of  Transportation (Non-Medical): No  Physical Activity: Insufficiently Active (03/29/2022)   Exercise Vital Sign    Days of Exercise per Week: 2 days    Minutes of Exercise per Session: 10 min  Stress: Stress Concern Present (05/07/2022)   [employer] of Occupational Health - Occupational Stress Questionnaire    Feeling of Stress : To some extent  Social Connections: Not on file  Intimate Partner Violence: Not At Risk (06/20/2023)   Humiliation, Afraid, Rape, and Kick questionnaire    Fear of Current or Ex-Partner: No    Emotionally Abused: No    Physically Abused: No    Sexually Abused: No    Outpatient Medications Prior to Visit  Medication Sig Dispense Refill   amLODipine  (NORVASC ) 5 MG tablet Take 1 tablet by mouth once daily 90 tablet 0   b complex vitamins capsule Take 1 capsule by mouth daily. With D3 gummie     benzonatate  (TESSALON ) 100 MG capsule Take 1  capsule (100 mg total) by mouth 3 (three) times daily as needed for cough. Do not take with alcohol or while operating or driving heavy machinery 21 capsule 0   Blood Glucose Monitoring Suppl DEVI 1 each by Does not apply route in the morning, at noon, and at bedtime. May substitute to any manufacturer covered by patient's insurance. 1 each 0   cetirizine (ZYRTEC) 10 MG tablet Take 10 mg by mouth daily.     Cholecalciferol (VITAMIN D -3 PO) Take by mouth daily at 6 (six) AM.     clindamycin  (CLEOCIN  T) 1 % external solution Apply topically to acne bumps at aa's qd 30 mL 3   clindamycin  (CLEOCIN -T) 1 % lotion Apply to affected areas of bumps once daily after shower. 60 mL 3   clobetasol  (TEMOVATE ) 0.05 % external solution Apply 1 application. topically 2 (two) times daily. Use as directed.  Mix with cerave cream. Use for up to 4 weeks. Avoid applying to face, groin, and axilla. Use as directed. 50 mL 1   clobetasol  cream (TEMOVATE ) 0.05 % Spot treat more severe areas once to twice daily until improved. Avoid applying to face,  groin, and axilla. Use as directed. Long-term use can cause thinning of the skin. 60 g 3   Continuous Glucose Sensor (DEXCOM G7 SENSOR) MISC Use it to check blood glucose 3 times before meals, at bedtime and as needed. 3 each 5   Continuous Glucose Transmitter (DEXCOM G6 TRANSMITTER) MISC Use to check blood sugar daily DX e11.65 1 each 3   cyclobenzaprine  (FLEXERIL ) 5 MG tablet Take 1 tablet (5 mg total) by mouth at bedtime as needed for muscle spasms. 30 tablet 1   diphenhydrAMINE -zinc  acetate (BENADRYL  EXTRA STRENGTH) cream Apply 1 application topically 3 (three) times daily as needed for itching. 28.4 g 0   docusate sodium  (COLACE) 100 MG capsule Take 100 mg by mouth 2 (two) times daily.     DULoxetine (CYMBALTA) 30 MG capsule Take 30 mg by mouth 2 (two) times daily.     fenofibrate  (TRICOR ) 145 MG tablet Take 1 tablet (145 mg total) by mouth daily. 90 tablet 3   fluocinonide  (LIDEX ) 0.05 % external solution Apply once or twice daily to affected areas on scalp up to 2 weeks as needed for itching. Avoid applying to face, groin, and axilla. 60 mL 2   fluticasone  (FLONASE  ALLERGY RELIEF) 50 MCG/ACT nasal spray Place 2 sprays into both nostrils daily as needed for allergies or rhinitis. 16 mL 2   gabapentin (NEURONTIN) 300 MG capsule Take 300 mg by mouth 3 (three) times daily.     Glucose Blood (BLOOD GLUCOSE TEST STRIPS) STRP 1 each by In Vitro route in the morning, at noon, and at bedtime. May substitute to any manufacturer covered by patient's insurance. 100 strip 1   hydrOXYzine  (ATARAX ) 25 MG tablet Take 1-2 tablets by mouth at bedtime as needed for itch. 60 tablet 5   hydrOXYzine  (VISTARIL ) 25 MG capsule TAKE 1 TO 2 CAPSULES BY MOUTH AT BEDTIME FOR ITCHING (  CAN  CAUSE  DROWSINESS) 60 capsule 0   JARDIANCE  10 MG TABS tablet TAKE 1 TABLET BY MOUTH ONCE DAILY BEFORE BREAKFAST 60 tablet 0   ketoconazole  (NIZORAL ) 2 % shampoo Massage into scalp 2-3 times a week, let sit several minutes before  rinsing. 120 mL 11   lubiprostone  (AMITIZA ) 24 MCG capsule TAKE 1 CAPSULE BY MOUTH TWICE DAILY WITH A MEAL 180 capsule 1   montelukast  (SINGULAIR ) 10  MG tablet TAKE 1 TABLET BY MOUTH AT BEDTIME 90 tablet 3   olmesartan (BENICAR) 5 MG tablet Take 5 mg by mouth daily.     omeprazole  (PRILOSEC) 40 MG capsule Take 1 capsule by mouth twice daily 180 capsule 0   ondansetron  (ZOFRAN ) 4 MG tablet Take 1 tablet (4 mg total) by mouth every 6 (six) hours as needed for nausea. 20 tablet 0   propranolol  (INDERAL ) 20 MG tablet Take one tablet 20 mg  twice daily  may take an  additional dose of 20 mg  as needed for palpitations 190 tablet 3   rosuvastatin  (CRESTOR ) 40 MG tablet Take 1 tablet (40 mg total) by mouth daily. 90 tablet 3   Testosterone  25 MG/2.5GM (1%) GEL Place 5 g onto the skin every morning. 1 packet to each shoulder q am. 150 g 5   triamcinolone  cream (KENALOG ) 0.1 %      Vitamin D , Ergocalciferol , (DRISDOL ) 1.25 MG (50000 UNIT) CAPS capsule Take 1 capsule (50,000 Units total) by mouth every 7 (seven) days. 20 capsule 1   capsicum (ZOSTRIX) 0.075 % topical cream Apply 1 Application topically 4 (four) times daily. 57 g 0   No facility-administered medications prior to visit.    Allergies  Allergen Reactions   Gramineae Pollens Itching   Claritin [Loratadine] Other (See Comments)    Heart race   Loratadine-Pseudoephedrine Er Palpitations   Pollen Extract Other (See Comments)    Seasonal allergies    Review of Systems  Constitutional:  Positive for fatigue. Negative for chills and fever.  HENT:  Negative for congestion and sore throat.   Eyes:  Negative for pain and discharge.  Respiratory:  Positive for shortness of breath (Intermittent). Negative for cough.   Cardiovascular:  Positive for palpitations. Negative for chest pain.  Gastrointestinal:  Positive for constipation. Negative for diarrhea, nausea and vomiting.  Endocrine: Negative for polydipsia and polyuria.  Genitourinary:   Negative for dysuria and hematuria.  Musculoskeletal:  Positive for arthralgias, back pain, myalgias and neck pain. Negative for neck stiffness.  Neurological:  Positive for tremors. Negative for headaches.  Psychiatric/Behavioral:  Positive for sleep disturbance. Negative for agitation and behavioral problems. The patient is nervous/anxious.        Objective:    Physical Exam Vitals reviewed.  Constitutional:      General: He is not in acute distress.    Appearance: He is not diaphoretic.  HENT:     Head: Normocephalic and atraumatic.     Nose: No congestion.     Mouth/Throat:     Mouth: Mucous membranes are moist.  Eyes:     General: No scleral icterus.    Extraocular Movements: Extraocular movements intact.  Cardiovascular:     Rate and Rhythm: Normal rate and regular rhythm.     Heart sounds: Normal heart sounds. No murmur heard. Pulmonary:     Breath sounds: Normal breath sounds. No wheezing or rales.  Abdominal:     General: Bowel sounds are normal.     Palpations: Abdomen is soft.     Tenderness: There is no abdominal tenderness.  Musculoskeletal:        General: Tenderness (Mid thoracic and lumbar spine area) present.     Cervical back: Neck supple. No tenderness.     Right lower leg: No edema.     Left lower leg: No edema.  Skin:    General: Skin is warm.     Coloration: Skin is not jaundiced.  Neurological:     General: No focal deficit present.     Mental Status: He is alert and oriented to person, place, and time.     Sensory: No sensory deficit.     Motor: No weakness.     Comments: Functional tremors on right hand  Psychiatric:        Mood and Affect: Mood is anxious.        Behavior: Behavior normal.        Thought Content: Thought content does not include homicidal or suicidal ideation.     BP 120/83   Pulse 68   Ht 6' 3 (1.905 m)   Wt 263 lb 12.8 oz (119.7 kg)   SpO2 97%   BMI 32.97 kg/m  Wt Readings from Last 3 Encounters:  05/17/24 263  lb 12.8 oz (119.7 kg)  05/13/24 264 lb 4.8 oz (119.9 kg)  04/26/24 264 lb 3.2 oz (119.8 kg)        Assessment & Plan:   Problem List Items Addressed This Visit       Respiratory   OSA (obstructive sleep apnea) - Primary (Chronic)   Has anxiety while using it, needs evaluation for pressure setting and different mask Can be considered for Inspire device - will defer to sleep clinic Followed by pulmonology He is an excellent candidate for GLP-1 agonist therapy, mainly Zepbound - but insurance coverage/cost is a concern        Endocrine   Hypogonadism, male   Testosterone  level was WNL recently Has testosterone  gel now Followed by urology      Type 2 diabetes mellitus with diabetic neuropathy, without long-term current use of insulin (HCC)   Has history of peripheral neuropathy Has been evaluated by [facility] neurology On gabapentin 300 mg TID and Cymbalta 30 mg BID Referred to Podiatry for chronic foot pain - needs diabetic shoes/inserts        No orders of the defined types were placed in this encounter.    Suzzane MARLA Blanch, MD

## 2024-05-17 NOTE — Telephone Encounter (Signed)
 Has a question regarding new testosterone  cream. Patient wants to make sure the amount  he is using the correct amount.  Please advise.  Call:  [phone]

## 2024-05-17 NOTE — Assessment & Plan Note (Addendum)
 Has anxiety while using it, needs evaluation for pressure setting and different mask Can be considered for Inspire device - will defer to sleep clinic Followed by pulmonology He is an excellent candidate for GLP-1 agonist therapy, mainly Zepbound - but insurance coverage/cost is a concern

## 2024-05-17 NOTE — Telephone Encounter (Signed)
 Copied from CRM [id number]. Topic: Clinical - Red Word Triage >> May 17, 2024  2:30 PM Chantha C wrote: Red Word that prompted transfer to Nurse Triage: Patient [phone] states the cpap mask is good for him, feels like it suffocating and stop using cpap machine for a couple of months. Patient states feels more tired, shortness of breath, denies dizziness. Patient states saw pcp Dr. Tobie and was advised patient to follow up with pulmonologist. Please advise. Reason for Disposition  [1] MILD longstanding difficulty breathing AND [2]  SAME as normal  Answer Assessment - Initial Assessment Questions E2C2 Pulmonary Triage - Initial Assessment Questions Chief Complaint (e.g., cough, sob, wheezing, fever, chills, sweat or additional symptoms) *Go to specific symptom protocol after initial questions. SOB - endorses working in heat-- gets wore out easy and SOB with exertion Pt endorses CPAP mask is suffocating me  How long have symptoms been present? A while it's never changed since I've been on the CPAP   Have you tested for COVID or Flu? Note: If not, ask patient if a home test can be taken. If so, instruct patient to call back for positive results. No  MEDICINES:   Have you used any OTC meds to help with symptoms? No If yes, ask What medications? N/a  Have you used your inhalers/maintenance medication? Yes If yes, What medications? Endorses a short thing... I squirt in my mouth - takes PRN - provides relief for a short period  If inhaler, ask How many puffs and how often? Note: Review instructions on medication in the chart. See above  OXYGEN : Do you wear supplemental oxygen ? No If yes, How many liters are you supposed to use? N/a  Do you monitor your oxygen  levels? No If yes, What is your reading (oxygen  level) today? N/a  What is your usual oxygen  saturation reading?  (Note: Pulmonary O2 sats should be 90% or greater) N/a    1. RESPIRATORY  STATUS: Describe your breathing? (e.g., wheezing, shortness of breath, unable to speak, severe coughing)      See above 2. ONSET: When did this breathing problem begin?      See above 3. PATTERN Does the difficult breathing come and go, or has it been constant since it started?      Comes and goes 4. SEVERITY: How bad is your breathing? (e.g., mild, moderate, severe)    - MILD: No SOB at rest, mild SOB with walking, speaks normally in sentences, can lie down, no retractions, pulse < 100.    - MODERATE: SOB at rest, SOB with minimal exertion and prefers to sit, cannot lie down flat, speaks in phrases, mild retractions, audible wheezing, pulse 100-120.    - SEVERE: Very SOB at rest, speaks in single words, struggling to breathe, sitting hunched forward, retractions, pulse > 120      Mild - with exertion Triager does not appreciate audible SOB/wheezing during call. Pt is speaking in full sentences.  5. RECURRENT SYMPTOM: Have you had difficulty breathing before? If Yes, ask: When was the last time? and What happened that time?      denies 6. CARDIAC HISTORY: Do you have any history of heart disease? (e.g., heart attack, angina, bypass surgery, angioplasty)      denies 7. LUNG HISTORY: Do you have any history of lung disease?  (e.g., pulmonary embolus, asthma, emphysema)     OSA 8. CAUSE: What do you think is causing the breathing problem?      unknown 9. OTHER SYMPTOMS:  Do you have any other symptoms? (e.g., dizziness, runny nose, cough, chest pain, fever)     Occasional productive clear/yellow cough  10. O2 SATURATION MONITOR:  Do you use an oxygen  saturation monitor (pulse oximeter) at home? If Yes, ask: What is your reading (oxygen  level) today? What is your usual oxygen  saturation reading? (e.g., 95%)       N/a 11. PREGNANCY: Is there any chance you are pregnant? When was your last menstrual period?       N/a 12. TRAVEL: Have you traveled out of the country  in the last month? (e.g., travel history, exposures)       N/a  Protocols used: Breathing Difficulty-A-AH

## 2024-05-17 NOTE — Assessment & Plan Note (Signed)
 Testosterone level was WNL recently Has testosterone gel now Followed by urology

## 2024-05-17 NOTE — Assessment & Plan Note (Signed)
 Has history of peripheral neuropathy Has been evaluated by [facility] neurology On gabapentin 300 mg TID and Cymbalta 30 mg BID Referred to Podiatry for chronic foot pain - needs diabetic shoes/inserts

## 2024-05-18 NOTE — Telephone Encounter (Signed)
 Per Margie,the appointment is okay.  Nothing further needed.

## 2024-05-20 ENCOUNTER — Encounter: Payer: Self-pay | Admitting: Pulmonary Disease

## 2024-05-20 ENCOUNTER — Ambulatory Visit: Admitting: Pulmonary Disease

## 2024-05-20 VITALS — BP 120/70 | HR 87 | Temp 97.1°F | Ht 75.0 in | Wt 268.6 lb

## 2024-05-20 DIAGNOSIS — J019 Acute sinusitis, unspecified: Secondary | ICD-10-CM

## 2024-05-20 DIAGNOSIS — G4733 Obstructive sleep apnea (adult) (pediatric): Secondary | ICD-10-CM | POA: Diagnosis not present

## 2024-05-20 DIAGNOSIS — R0602 Shortness of breath: Secondary | ICD-10-CM

## 2024-05-20 MED ORDER — FLUTICASONE PROPIONATE 50 MCG/ACT NA SUSP: 1.0000 | Freq: Every day | NASAL | 2 refills | Status: AC

## 2024-05-20 MED ORDER — BUDESONIDE-FORMOTEROL FUMARATE 160-4.5 MCG/ACT IN AERO: 2.0000 | INHALATION_SPRAY | Freq: Two times a day (BID) | RESPIRATORY_TRACT | 12 refills | Status: DC

## 2024-05-20 NOTE — Progress Notes (Signed)
 Synopsis: Referred in by Tobie Suzzane POUR, MD   Subjective:   PATIENT ID: Elijah Shaffer GENDER: male DOB: 1981/10/03, MRN: [id number]  Chief Complaint  Patient presents with   Follow-up    Increased SOB since 2022. No increase in SOB since 2022. Feels like his CPAP mask is suffocating him off and on for the last year. He has also started to bite on it. He is not used his CPAP since the middle of last year.    HPI Elijah Shaffer is a 43 year old male, never smoker with history of GERD, large hiatal hernia, seasonal allergies and OSA who returns to pulmonary clinic for acute visit.   He returns for acute visit, increased dyspnea and chest discomfort with exertional work. This has progressed over recent weeks. He also feels that the sleep mask is suffocating him at night. I reviewd his PFTs from 2022 which showed some response to bronchodilators in FVC with a change of 0.3L but 5% granted does not meet ATS criteria. He does report allergic rhinitis and heat and humidity hypersensivity. He might have a component of asthma and we decided to trial symbicort  which he is agreeable with and will follow up with Dr. Kara in 3 months. Also discussed scheduling with a sleep specialist and he is agreeable with and will schedule an appt with Dr. Jess.    OV 01/01/23 He was feeling well for a few months, but recently he has noticed events of shortness of breath, tired feeling in his legs and light headed. He has noticed these symptoms with exertion. No cough or wheezing.   He has not been consistently using his CPAP machine over recent months. He is having trouble with mask leak and fit.    OV 02/15/22 He was started on CPAP therapy and his CPAP download shows 100% compliance with 0.7 AHI. He initially had a full face mask which was switched to a hybrid nasal pillow full face mask due to the skin on his nose getting sore.   He is following with Neurology. He had an abnormal EMG and has elevated protein on  CSF evaluation. There are CSF tests pending at this time.    OV 01/02/22 He continues to have easy fatigability and dyspnea with exertion. Heart cath 2/1 shows normal coronary arteries and normal hemodynamics. ICS/LABA inhaler therapy stopped at last visit with no changes in his symptoms.   He is being evaluated by rheumatology for elevated CK level and RNP ab level. He is being evaluated for numbness and tingling symptoms by neurology and is ordered for an EMG and cervical spine MRI.    He went to urgent care on 2/16 for shortness of breath and chest pains. Given patient's concerns for tick exposures, lyme and rocky [place] spotted fever labs ordered. RMSF IgG is negative and IgM is elevated at 1.67. D-dimer 2/16 was not elevated. He saw his PCP on 12/31/21 and was prescribed doxycyline but has not taken this medication yet.    Home sleep study 12/12/21 showed moderate obstructive sleep apnea.    OV 10/31/21 HRCT chest does not indicate ILD involvement. There is a large hiatal hernia noted. Pulmonary function tests show mild diffusion defect with normal spirometry and normal TLC. It was difficult to get consistent data during the pulmonary function testing as the patient was getting fatigued.   Echo from 02/24/21 showed mildly dilated RV with normal function.    CK level is mildly elevated at 356 and ENA RNP  ab is elevated. Remaining inflammatory workup is unremarkable which included myositis panel, ANA, CCP, dsDNA, RF, ESR, CRP and ddimer. Hypersensitivity pneumonitis panel is negative.   He continues to have exertional dyspnea and easily fatigued with exertion. He describes a pre-syncopal event when working recently. He continues to work as a Administrator.    He reports a small raised dry patch of skin on left lower extremity, lateral side and similar rash on right lateral ankle that is itchy. He also reports feeling pain in his legs when laying down at night, even with out a hard days work.       OV 10/02/21 He called in after being seen on 11/18 complaining of similar complaints as discussed in the office visit that day. He complains of more fatigue than normal and shortness of breath with exertion. He has felt lightheaded with exertion before. He feels very run down. He denies any fevers or chills. His appetite remains good.   OV 09/28/21 He continues to experience exertional dyspnea. He was trialed on as needed albuterol  with some improvement but continues to experience significant dyspnea with exertion. For example, he was short of breath walking around [employer] this past week. He denies wheezing. He does have cough with greenish-yellowish sputum.    Nasal congestion is improved with fluticasone  nasal spray. He is in leaf season now as a Administrator.    He was unable to have PFTs done since last visit as his mother was recently diagnosed with cervical cancer and they were occupied with her treatment schedule.    He denies leg swelling, chest pain or syncopal episodes.   He raised the question with his previous work exposures using round-up and other herbicide chemicals could lead to his breathing issues.   OV 07/24/21 He reports shortness of breath over the last 4 months.  The shortness of breath is evident with exertion and resolves with rest.  Patient has been treated for tickborne illness with concern for Ehrlichia and has been on doxycycline .  He is being evaluated by infectious disease today.  He has been much more inactive over these last 4 months.  He works as a Administrator and has not been working as much due to the shortness of breath.  He denies any wheezing or chest tightness.  He has intermittent dry cough that is occasionally productive with sputum.  He is being treated with PPI therapy for GERD and has a small hiatal hernia on CT imaging.   He does report seasonal allergies with nasal and sinus congestion more so in the spring and fall.   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  He denies any history of blood clots.  No family history of blood clots.  No family history of lung disease.   No history of smoking or vaping.  He does report some intermittent secondhand smoke exposure via social gatherings.  He did grow up in a home with a wood stove.   ROS All systems were reviewed and are negative except for the above.  Objective:   Vitals:   05/20/24 1329  BP: 120/70  Pulse: 87  Temp: (!) 97.1 F (36.2 C)  SpO2: 95%  Weight: 268 lb 9.6 oz (121.8 kg)  Height: 6' 3 (1.905 m)   95% on RA BMI Readings from Last 3 Encounters:  05/20/24 33.57 kg/m  05/17/24 32.97 kg/m  05/13/24 33.04 kg/m   Wt Readings from Last 3 Encounters:  05/20/24 268 lb 9.6 oz (  121.8 kg)  05/17/24 263 lb 12.8 oz (119.7 kg)  05/13/24 264 lb 4.8 oz (119.9 kg)    Physical Exam GEN: NAD, Healthy Appearing HEENT: Supple Neck, Reactive Pupils, EOMI  CVS: Normal S1, Normal S2, RRR, No murmurs or ES appreciated  Lungs: Clear bilateral air entry.  Abdomen: Soft, non tender, non distended, + BS  Extremities: Warm and well perfused, No edema  Skin: No suspicious lesions appreciated  Psych: Normal Affect  Ancillary Information   CBC    Component Value Date/Time   WBC 12.2 (H) 04/07/2024 1031   WBC 11.5 (H) 03/25/2024 0852   RBC 5.77 04/07/2024 1031   RBC 5.60 03/25/2024 0852   HGB 16.6 04/07/2024 1031   HCT 50.9 04/07/2024 1031   PLT 274 04/07/2024 1031   MCV 88 04/07/2024 1031   MCH 28.8 04/07/2024 1031   MCH 28.2 03/25/2024 0852   MCHC 32.6 04/07/2024 1031   MCHC 32.4 03/25/2024 0852   RDW 14.2 04/07/2024 1031   LYMPHSABS 2.9 04/07/2024 1031   MONOABS 0.8 03/25/2024 0852   EOSABS 0.0 04/07/2024 1031   BASOSABS 0.1 04/07/2024 1031      Latest Ref Rng & Units 10/30/2021    9:06 AM  PFT Results  FVC-Pre L 5.65   FVC-Predicted Pre % 91   FVC-Post L 5.94   FVC-Predicted Post % 96   Pre FEV1/FVC % % 88   Post FEV1/FCV % % 82   FEV1-Pre L 4.97    FEV1-Predicted Pre % 101   FEV1-Post L 4.88   DLCO uncorrected ml/min/mmHg 24.00   DLCO UNC% % 66   DLCO corrected ml/min/mmHg 23.19   DLCO COR %Predicted % 64   DLVA Predicted % 88   TLC L 7.27   TLC % Predicted % 91   RV % Predicted % 55     Assessment & Plan:  Elijah Shaffer is a 43 year old male, never smoker with history of GERD, large hiatal hernia, seasonal allergies and OSA who returns to pulmonary clinic for acute visit.   #Shortness of breath on exertion #Chronic rhinosinusitis Possible component of asthma.   []  Trial budesonide -formoterol  [Symbicort ] 2puffs BID. Advised mouth rinsing after each use.  []  Albuterol  as needed.  []  Flonase  1spray each nostril daily.  []  PFTs   #Moderate OSA  Having difficulty with the mask, feeling of suffocation. Was inquiring about Inspire. I will have him see Sleep medicine for further management of his OSA.   Return in about 3 months (around 08/20/2024) for with Dr. Kara .  I spent 60 minutes caring for this patient today, including preparing to see the patient, obtaining a medical history , reviewing a separately obtained history, performing a medically appropriate examination and/or evaluation, counseling and educating the patient/family/caregiver, ordering medications, tests, or procedures, documenting clinical information in the electronic health record, and independently interpreting results (not separately reported/billed) and communicating results to the patient/family/caregiver  Darrin Barn, MD [facility] 05/20/2024 5:47 PM

## 2024-05-24 ENCOUNTER — Ambulatory Visit: Admitting: Internal Medicine

## 2024-05-25 ENCOUNTER — Encounter: Payer: Self-pay | Admitting: Pulmonary Disease

## 2024-05-25 ENCOUNTER — Ambulatory Visit: Payer: 59 | Admitting: Dermatology

## 2024-05-25 ENCOUNTER — Ambulatory Visit: Admitting: Dermatology

## 2024-05-25 DIAGNOSIS — L814 Other melanin hyperpigmentation: Secondary | ICD-10-CM

## 2024-05-25 DIAGNOSIS — L739 Follicular disorder, unspecified: Secondary | ICD-10-CM

## 2024-05-25 MED ORDER — DOXYCYCLINE HYCLATE 50 MG PO CAPS: 150.0000 mg | ORAL_CAPSULE | Freq: Every day | ORAL | 3 refills | Status: DC

## 2024-05-25 MED ORDER — DOXYCYCLINE MONOHYDRATE 150 MG PO TABS: 150.0000 mg | ORAL_TABLET | Freq: Every evening | ORAL | 4 refills | Status: DC

## 2024-05-25 NOTE — Patient Instructions (Addendum)
 Doxycycline  should be taken with food to prevent nausea. Do not lay down for 30 minutes after taking. Be cautious with sun exposure and use good sun protection while on this medication. Pregnant women should not take this medication.   Benzoyl peroxide can cause dryness and irritation of the skin. It can also bleach fabric. When used together with Aczone (dapsone) cream, it can stain the skin orange.   Due to recent changes in healthcare laws, you may see results of your pathology and/or laboratory studies on MyChart before the doctors have had a chance to review them. We understand that in some cases there may be results that are confusing or concerning to you. Please understand that not all results are received at the same time and often the doctors may need to interpret multiple results in order to provide you with the best plan of care or course of treatment. Therefore, we ask that you please give us  2 business days to thoroughly review all your results before contacting the office for clarification. Should we see a critical lab result, you will be contacted sooner.   If You Need Anything After Your Visit  If you have any questions or concerns for your doctor, please call our main line at [phone] and press option 4 to reach your doctor's medical assistant. If no one answers, please leave a voicemail as directed and we will return your call as soon as possible. Messages left after 4 pm will be answered the following business day.   You may also send us  a message via MyChart. We typically respond to MyChart messages within 1-2 business days.  For prescription refills, please ask your pharmacy to contact our office. Our fax number is [phone].  If you have an urgent issue when the clinic is closed that cannot wait until the next business day, you can page your doctor at the number below.    Please note that while we do our best to be available for urgent issues outside of office hours, we  are not available 24/7.   If you have an urgent issue and are unable to reach us , you may choose to seek medical care at your doctor's office, retail clinic, urgent care center, or emergency room.  If you have a medical emergency, please immediately call 911 or go to the emergency department.  Pager Numbers  - Dr. Hester: [phone]  - Dr. Jackquline: [phone]  - Dr. Claudene: [phone]   In the event of inclement weather, please call our main line at [phone] for an update on the status of any delays or closures.  Dermatology Medication Tips: Please keep the boxes that topical medications come in in order to help keep track of the instructions about where and how to use these. Pharmacies typically print the medication instructions only on the boxes and not directly on the medication tubes.   If your medication is too expensive, please contact our office at [phone] option 4 or send us  a message through MyChart.   We are unable to tell what your co-pay for medications will be in advance as this is different depending on your insurance coverage. However, we may be able to find a substitute medication at lower cost or fill out paperwork to get insurance to cover a needed medication.   If a prior authorization is required to get your medication covered by your insurance company, please allow us  1-2 business days to complete this process.  Drug prices often vary depending on where  the prescription is filled and some pharmacies may offer cheaper prices.  The website www.goodrx.com contains coupons for medications through different pharmacies. The prices here do not account for what the cost may be with help from insurance (it may be cheaper with your insurance), but the website can give you the price if you did not use any insurance.  - You can print the associated coupon and take it with your prescription to the pharmacy.  - You may also stop by our office during regular  business hours and pick up a GoodRx coupon card.  - If you need your prescription sent electronically to a different pharmacy, notify our office through [facility] or by phone at [phone] option 4.     Si Usted Necesita Algo Despus de Su Visita  Tambin puede enviarnos un mensaje a travs de Clinical cytogeneticist. Por lo general respondemos a los mensajes de MyChart en el transcurso de 1 a 2 das hbiles.  Para renovar recetas, por favor pida a su farmacia que se ponga en contacto con nuestra oficina. Randi lakes de fax es [place] [phone].  Si tiene un asunto urgente cuando la clnica est cerrada y que no puede esperar hasta el siguiente da hbil, puede llamar/localizar a su doctor(a) al nmero que aparece a continuacin.   Por favor, tenga en cuenta que aunque hacemos todo lo posible para estar disponibles para asuntos urgentes fuera del horario de [place], no estamos disponibles las 24 horas del da, los 7 [address]  [address] de la [place].   Si tiene un problema urgente y no puede comunicarse con nosotros, puede optar por buscar atencin mdica  en el consultorio de su doctor(a), en una clnica privada, en un centro de atencin urgente o en una sala de emergencias.  Si tiene Engineer, drilling, por favor llame inmediatamente al 911 o vaya a la sala de emergencias.  Nmeros de bper  - Dr. Hester: [phone]  - Dra. Jackquline: [phone]  - Dr. Claudene: [phone]   En caso de inclemencias del tiempo, por favor llame a landry capes principal al [phone] para una actualizacin sobre el [place] de cualquier retraso o cierre.  Consejos para la medicacin en dermatologa: Por favor, guarde las cajas en las que vienen los medicamentos de uso tpico para ayudarle a seguir las instrucciones sobre dnde y cmo usarlos. Las farmacias generalmente imprimen las instrucciones del medicamento slo en las cajas y no directamente en los tubos del [place].   Si su medicamento es muy caro, por  favor, pngase en contacto con landry rieger llamando al [phone] y presione la opcin 4 o envenos un mensaje a travs de Clinical cytogeneticist.   No podemos decirle cul ser su copago por los medicamentos por adelantado ya que esto es diferente dependiendo de la cobertura de su seguro. Sin embargo, es posible que podamos encontrar un medicamento sustituto a Audiological scientist un formulario para que el seguro cubra el medicamento que se considera necesario.   Si se requiere una autorizacin previa para que su compaa de seguros malta su medicamento, por favor permtanos de 1 a 2 das hbiles para completar este proceso.  Los precios de los medicamentos varan con frecuencia dependiendo del Environmental consultant de dnde se surte la receta y alguna farmacias pueden ofrecer precios ms baratos.  El sitio web www.goodrx.com tiene cupones para medicamentos de Health and safety inspector. Los precios aqu no tienen en cuenta lo que podra costar con la ayuda del seguro (puede ser ms barato con su seguro), [employer]  sitio web puede darle el precio si no Visual merchandiser.  - Puede imprimir el cupn correspondiente y llevarlo con su receta a la farmacia.  - Tambin puede pasar por nuestra oficina durante el horario de atencin regular y Education officer, museum una tarjeta de cupones de GoodRx.  - Si necesita que su receta se enve electrnicamente a una farmacia diferente, informe a nuestra oficina a travs de MyChart de [place] o por telfono llamando al [phone] y presione la opcin 4.

## 2024-05-25 NOTE — Progress Notes (Signed)
   Follow-Up Visit   Subjective  Elijah Shaffer is a 43 y.o. male who presents for the following: Folliculitis of the abdomen, back, arms, and legs. Areas are not improving with clindamycin  solution and benzoyl peroxide wash.    The following portions of the chart were reviewed this encounter and updated as appropriate: medications, allergies, medical history  Review of Systems:  No other skin or systemic complaints except as noted in HPI or Assessment and Plan.  Objective  Well appearing patient in no apparent distress; mood and affect are within normal limits.  A focused examination was performed of the following areas: Trunk, extremities  Relevant physical exam findings are noted in the Assessment and Plan.    Assessment & Plan    FOLLICULITIS Exam: Follicular pink papules at abdomen, mid back.  Chronic and persistent condition with duration or expected duration over one year. Condition is symptomatic/ bothersome to patient. Not currently at goal.   Folliculitis occurs due to inflammation of the superficial hair follicle (pore), resulting in acne-like lesions (pus bumps). It can be infectious (bacterial, fungal) or noninfectious (shaving, tight clothing, heat/sweat, medications).  Folliculitis can be acute or chronic and recommended treatment depends on the underlying cause of folliculitis.  Treatment Plan: Start doxycycline  50 MG take 3 po qpm with food dsp #30 3Rf (150 mg tablet not covered by insurance) Continue Clindamycin  lotion once daily after shower. Continue OTC benzoyl peroxide wash in the shower.   Doxycycline  should be taken with food to prevent nausea. Do not lay down for 30 minutes after taking. Be cautious with sun exposure and use good sun protection while on this medication. Pregnant women should not take this medication.   Benzoyl peroxide can cause dryness and irritation of the skin. It can also bleach fabric. When used together with Aczone (dapsone) cream,  it can stain the skin orange.  LENTIGINES Exam: scattered tan macules arms Due to sun exposure Treatment Plan: Benign-appearing, observe. Recommend daily broad spectrum sunscreen SPF 30+ to sun-exposed areas, reapply every 2 hours as needed.  Call for any changes   Return as scheduled, for Atopic Dermatitis, folliculitis.  IAndrea Kerns, CMA, am acting as scribe for Rexene Rattler, MD .   Documentation: I have reviewed the above documentation for accuracy and completeness, and I agree with the above.  Rexene Rattler, MD

## 2024-05-25 NOTE — Telephone Encounter (Signed)
**Note De-identified  [place] Obfuscation** Please advise 

## 2024-05-26 ENCOUNTER — Ambulatory Visit (INDEPENDENT_AMBULATORY_CARE_PROVIDER_SITE_OTHER): Admitting: Sleep Medicine

## 2024-05-26 ENCOUNTER — Encounter: Payer: Self-pay | Admitting: Sleep Medicine

## 2024-05-26 VITALS — BP 112/60 | HR 75 | Temp 97.8°F | Ht 75.0 in | Wt 265.5 lb

## 2024-05-26 DIAGNOSIS — G4733 Obstructive sleep apnea (adult) (pediatric): Secondary | ICD-10-CM | POA: Diagnosis not present

## 2024-05-26 DIAGNOSIS — Z6833 Body mass index (BMI) 33.0-33.9, adult: Secondary | ICD-10-CM

## 2024-05-26 DIAGNOSIS — E119 Type 2 diabetes mellitus without complications: Secondary | ICD-10-CM

## 2024-05-26 DIAGNOSIS — E669 Obesity, unspecified: Secondary | ICD-10-CM | POA: Diagnosis not present

## 2024-05-26 NOTE — Progress Notes (Signed)
 Name:Elijah Shaffer MRN: [id number] DOB: Jun 14, 1981   CHIEF COMPLAINT:  REASSESSMENT OF OSA   HISTORY OF PRESENT ILLNESS:  Elijah Shaffer is a 43 y.o. w/ a h/o OSA, DMII, GERD, hyperlipidemia and obesity who presents for reassessment of OSA. Reports that he is having difficulty using CPAP therapy due to mask discomfort. States that he feels suffocated with CPAP therapy. Reports a 20 lb weight loss over the last years.   Admits to excessive daytime sleepiness. Also reports nocturnal awakenings due to nocturia, however does not have difficulty falling back to sleep. Denies any significant weight changes. Admits to night sweats. Denies morning headaches, RLS symptoms, dream enactment, cataplexy, hypnagogic or hypnapompic hallucinations. Denies a family history of sleep apnea. Denies drowsy driving. Drinks 2-3 sodas daily, denies alcohol, tobacco or illicit drug use.   Bedtime 8:30-9 pm Sleep onset 10 mins Rise time 5:30-6 am   EPWORTH SLEEP SCORE 8    05/26/2024    1:53 PM  Results of the Epworth flowsheet  Sitting and reading 1  Watching TV 0  Sitting, inactive in a public place (e.g. a theatre or a meeting) 2  As a passenger in a car for an hour without a break 2  Lying down to rest in the afternoon when circumstances permit 2  Sitting and talking to someone 0  Sitting quietly after a lunch without alcohol 1  In a car, while stopped for a few minutes in traffic 0  Total score 8     PAST MEDICAL HISTORY :   has a past medical history of Acute renal injury (HCC) (06/28/2017), Anxiety, Aspiration pneumonia of right upper lobe due to gastric secretions (HCC), Diabetes mellitus without complication (HCC), Diverticulitis, GERD (gastroesophageal reflux disease) (01/08/2018), Hypertension, Prediabetes, Seasonal allergies, Sleep apnea, and Syncope (06/26/2021).  has a past surgical history that includes Colonoscopy (N/A, 09/22/2017); polypectomy (09/22/2017);  Esophagogastroduodenoscopy (egd) with propofol  (N/A, 03/18/2021); biopsy (03/18/2021); Colonoscopy with propofol  (N/A, 05/23/2021); Esophagogastroduodenoscopy (egd) with propofol  (N/A, 05/23/2021); biopsy (05/23/2021); transthoracic echocardiogram (02/24/2021); RIGHT/LEFT HEART CATH AND CORONARY ANGIOGRAPHY (N/A, 12/12/2021); ZIO PATCH EVENT MONITOR (01/2022); CARDIOPULMONARY EXERCISE TEST (CPX) (01/09/2022); Esophagogastroduodenoscopy (egd) with propofol  (N/A, 08/19/2023); biopsy (08/19/2023); Esophagogastroduodenoscopy (egd) with propofol  (N/A, 10/08/2023); and Givens capsule study (N/A, 10/08/2023). Prior to Admission medications   Medication Sig Start Date End Date Taking? Authorizing Provider  albuterol  (VENTOLIN  HFA) 108 (90 Base) MCG/ACT inhaler Inhale 1-2 puffs into the lungs every 6 (six) hours as needed for wheezing or shortness of breath.   Yes [provider]  amLODipine  (NORVASC ) 5 MG tablet Take 1 tablet by mouth once daily 03/04/24  Yes Patel, Rutwik K, MD  b complex vitamins capsule Take 1 capsule by mouth daily. With D3 gummie   Yes [provider]  benzonatate  (TESSALON ) 100 MG capsule Take 1 capsule (100 mg total) by mouth 3 (three) times daily as needed for cough. Do not take with alcohol or while operating or driving heavy machinery 5/85/74  Yes Chandra Raisin A, NP  Blood Glucose Monitoring Suppl DEVI 1 each by Does not apply route in the morning, at noon, and at bedtime. May substitute to any manufacturer covered by patient's insurance. 10/13/23  Yes Tobie Suzzane POUR, MD  budesonide -formoterol  (SYMBICORT ) 160-4.5 MCG/ACT inhaler Inhale 2 puffs into the lungs in the morning and at bedtime. 05/20/24  Yes Assaker, Darrin, MD  cetirizine (ZYRTEC) 10 MG tablet Take 10 mg by mouth daily.   Yes [provider]  Cholecalciferol (VITAMIN  D-3 PO) Take by mouth daily at 6 (six) AM.   Yes [provider]  clindamycin  (CLEOCIN  T) 1 % external solution Apply  topically to acne bumps at aa's qd 06/17/22  Yes Jackquline Sawyer, MD  clindamycin  (CLEOCIN -T) 1 % lotion Apply to affected areas of bumps once daily after shower. 03/17/24  Yes Jackquline Sawyer, MD  clobetasol  (TEMOVATE ) 0.05 % external solution Apply 1 application. topically 2 (two) times daily. Use as directed.  Mix with cerave cream. Use for up to 4 weeks. Avoid applying to face, groin, and axilla. Use as directed. 03/21/22  Yes Jackquline Sawyer, MD  clobetasol  cream (TEMOVATE ) 0.05 % Spot treat more severe areas once to twice daily until improved. Avoid applying to face, groin, and axilla. Use as directed. Long-term use can cause thinning of the skin. 03/17/24  Yes Jackquline Sawyer, MD  Continuous Glucose Sensor (DEXCOM G7 SENSOR) MISC Use it to check blood glucose 3 times before meals, at bedtime and as needed. 04/26/24  Yes Tobie Suzzane POUR, MD  Continuous Glucose Transmitter (DEXCOM G6 TRANSMITTER) MISC Use to check blood sugar daily DX e11.65 10/23/23  Yes Tobie Suzzane POUR, MD  cyclobenzaprine  (FLEXERIL ) 5 MG tablet Take 1 tablet (5 mg total) by mouth at bedtime as needed for muscle spasms. 08/06/23  Yes Tobie Suzzane POUR, MD  diphenhydrAMINE -zinc  acetate (BENADRYL  EXTRA STRENGTH) cream Apply 1 application topically 3 (three) times daily as needed for itching. 12/07/21  Yes Paseda, Folashade R, FNP  docusate sodium  (COLACE) 100 MG capsule Take 100 mg by mouth 2 (two) times daily.   Yes [provider]  doxycycline  (VIBRAMYCIN ) 50 MG capsule Take 3 capsules (150 mg total) by mouth daily. Take with food. 05/25/24  Yes Jackquline Sawyer, MD  DULoxetine (CYMBALTA) 30 MG capsule Take 30 mg by mouth 2 (two) times daily. 01/05/24  Yes [provider]  fenofibrate  (TRICOR ) 145 MG tablet Take 1 tablet (145 mg total) by mouth daily. 04/26/24  Yes Anner Alm ORN, MD  fluocinonide  (LIDEX ) 0.05 % external solution Apply once or twice daily to affected areas on scalp up to 2 weeks as needed for itching. Avoid applying to  face, groin, and axilla. 06/11/22  Yes Jackquline Sawyer, MD  fluticasone  (FLONASE  ALLERGY RELIEF) 50 MCG/ACT nasal spray Place 2 sprays into both nostrils daily as needed for allergies or rhinitis. 11/14/23  Yes Tobie Suzzane POUR, MD  fluticasone  (FLONASE ) 50 MCG/ACT nasal spray Place 1 spray into both nostrils daily. 05/20/24 05/20/25 Yes Assaker, Darrin, MD  gabapentin (NEURONTIN) 300 MG capsule Take 300 mg by mouth 3 (three) times daily.   Yes [provider]  Glucose Blood (BLOOD GLUCOSE TEST STRIPS) STRP 1 each by In Vitro route in the morning, at noon, and at bedtime. May substitute to any manufacturer covered by patient's insurance. 10/13/23  Yes Tobie Suzzane POUR, MD  hydrOXYzine  (ATARAX ) 25 MG tablet Take 1-2 tablets by mouth at bedtime as needed for itch. 03/17/24  Yes Jackquline Sawyer, MD  hydrOXYzine  (VISTARIL ) 25 MG capsule TAKE 1 TO 2 CAPSULES BY MOUTH AT BEDTIME FOR ITCHING (  CAN  CAUSE  DROWSINESS) 03/08/24  Yes Jackquline Sawyer, MD  JARDIANCE  10 MG TABS tablet TAKE 1 TABLET BY MOUTH ONCE DAILY BEFORE BREAKFAST 03/29/24  Yes Tobie Suzzane POUR, MD  ketoconazole  (NIZORAL ) 2 % shampoo Massage into scalp 2-3 times a week, let sit several minutes before rinsing. 03/17/24  Yes Jackquline Sawyer, MD  lubiprostone  (AMITIZA ) 24 MCG capsule TAKE 1 CAPSULE BY  MOUTH TWICE DAILY WITH A MEAL 12/29/23  Yes Carlan, Chelsea L, NP  montelukast  (SINGULAIR ) 10 MG tablet TAKE 1 TABLET BY MOUTH AT BEDTIME 08/12/23  Yes Dewald, Dorn NOVAK, MD  olmesartan (BENICAR) 5 MG tablet Take 5 mg by mouth daily. 12/17/23  Yes [provider]  omeprazole  (PRILOSEC) 40 MG capsule Take 1 capsule by mouth twice daily 03/04/24  Yes Castaneda Mayorga, Toribio, MD  ondansetron  (ZOFRAN ) 4 MG tablet Take 1 tablet (4 mg total) by mouth every 6 (six) hours as needed for nausea. 06/04/21  Yes Arloa Suzen RAMAN, NP  propranolol  (INDERAL ) 20 MG tablet Take one tablet 20 mg  twice daily  may take an  additional dose of 20 mg  as needed for  palpitations 03/25/22  Yes Anner Alm ORN, MD  rosuvastatin  (CRESTOR ) 40 MG tablet Take 1 tablet (40 mg total) by mouth daily. 04/26/24  Yes Anner Alm ORN, MD  Testosterone  25 MG/2.5GM (1%) GEL Place 5 g onto the skin every morning. 1 packet to each shoulder q am. 04/15/24  Yes Watt Rush, MD  triamcinolone  cream (KENALOG ) 0.1 %  03/07/22  Yes [provider]  Vitamin D , Ergocalciferol , (DRISDOL ) 1.25 MG (50000 UNIT) CAPS capsule Take 1 capsule (50,000 Units total) by mouth every 7 (seven) days. 10/01/23  Yes Zarwolo, Gloria, FNP   Allergies  Allergen Reactions   Gramineae Pollens Itching   Claritin [Loratadine] Other (See Comments)    Heart race   Loratadine-Pseudoephedrine Er Palpitations   Pollen Extract Other (See Comments)    Seasonal allergies    FAMILY HISTORY:  family history includes Cancer in his father; Cervical cancer in his mother; Colon cancer in his maternal grandfather and maternal grandmother; Healthy in his mother. SOCIAL HISTORY:  reports that he has never smoked. He has been exposed to tobacco smoke. He has never used smokeless tobacco. He reports that he does not currently use alcohol. He reports that he does not use drugs.   Review of Systems:  Gen:  Denies  fever, sweats, chills weight loss  HEENT: Denies blurred vision, double vision, ear pain, eye pain, hearing loss, nose bleeds, sore throat Cardiac:  No dizziness, chest pain or heaviness, chest tightness,edema, No JVD Resp:   No cough, -sputum production, -shortness of breath,-wheezing, -hemoptysis,  Gi: Denies swallowing difficulty, stomach pain, nausea or vomiting, diarrhea, constipation, bowel incontinence Gu:  Denies bladder incontinence, burning urine Ext:   Denies Joint pain, stiffness or swelling Skin: Denies  skin rash, easy bruising or bleeding or hives Endoc:  Denies polyuria, polydipsia , polyphagia or weight change Psych:   Denies depression, insomnia or hallucinations  Other:  All  other systems negative  VITAL SIGNS: BP 112/60 (BP Location: Right Arm, Patient Position: Sitting, Cuff Size: Large)   Pulse 75   Temp 97.8 F (36.6 C) (Oral)   Ht 6' 3 (1.905 m)   Wt 265 lb 8 oz (120.4 kg)   SpO2 96%   BMI 33.19 kg/m    Physical Examination:   General Appearance: No distress  EYES PERRLA, EOM intact.   NECK Supple, No JVD Pulmonary: normal breath sounds, No wheezing.  CardiovascularNormal S1,S2.  No m/r/g.   Abdomen: Benign, Soft, non-tender. Skin:   warm, no rashes, no ecchymosis  Extremities: normal, no cyanosis, clubbing. Neuro:without focal findings,  speech normal  PSYCHIATRIC: Mood, affect within normal limits.   ASSESSMENT AND PLAN  OSA I suspect that OSA has worsened due to significant weight gain. Will complete reassessment  with HST. Due to CPAP intolerance, will send patient to ENT for Inspire eval and for DISE procedure. Discussed the consequences of untreated sleep apnea. Advised not to drive drowsy for safety of patient and others. Will complete further evaluation with a home sleep study and referral to ENT for eval.    DMII Stable, on current management. Following with PCP.   Obesity Counseled patient on diet and lifestyle modification.    MEDICATION ADJUSTMENTS/LABS AND TESTS ORDERED: Recommend Sleep Study   Patient  satisfied with Plan of action and management. All questions answered  Follow up to review HST results and treatment plan.   I spent a total of 41 minutes reviewing chart data, face-to-face evaluation with the patient, counseling and coordination of care as detailed above.    Seva Chancy, M.D.  Sleep Medicine [facility]

## 2024-05-26 NOTE — Telephone Encounter (Signed)
 Can you please schedule pt for sleep consult?

## 2024-05-26 NOTE — Patient Instructions (Signed)
 SABRA

## 2024-05-27 ENCOUNTER — Encounter: Payer: Self-pay | Admitting: Internal Medicine

## 2024-05-27 ENCOUNTER — Ambulatory Visit

## 2024-05-27 NOTE — Progress Notes (Signed)
 Ordered 2pr of boots for patient to try on if he chooses one they would be patient pay $200 per pair

## 2024-05-28 ENCOUNTER — Telehealth: Payer: Self-pay

## 2024-05-28 ENCOUNTER — Telehealth: Payer: Self-pay | Admitting: Internal Medicine

## 2024-05-28 NOTE — Telephone Encounter (Signed)
 Copied from CRM [id number]. Topic: Clinical - Red Word Triage >> May 28, 2024  1:57 PM [name] wrote: Red Word that prompted transfer to Nurse Triage: The patient was returning a call from [place], [place]. However, the office advised me that she has left for the day. When I returned to advise the patient, the patient advised he has been feeling unwell and fell earlier today but he declined Nurse Triage. I am sending to the office for further review and assistance.

## 2024-05-28 NOTE — Telephone Encounter (Signed)
Tried calling pt lmtrc.

## 2024-05-31 ENCOUNTER — Other Ambulatory Visit: Payer: Self-pay | Admitting: Internal Medicine

## 2024-05-31 ENCOUNTER — Encounter: Payer: Self-pay | Admitting: Internal Medicine

## 2024-05-31 ENCOUNTER — Other Ambulatory Visit (INDEPENDENT_AMBULATORY_CARE_PROVIDER_SITE_OTHER): Payer: Self-pay | Admitting: Gastroenterology

## 2024-05-31 ENCOUNTER — Ambulatory Visit: Admitting: Nurse Practitioner

## 2024-05-31 ENCOUNTER — Encounter

## 2024-05-31 DIAGNOSIS — K219 Gastro-esophageal reflux disease without esophagitis: Secondary | ICD-10-CM

## 2024-05-31 DIAGNOSIS — I10 Essential (primary) hypertension: Secondary | ICD-10-CM

## 2024-05-31 DIAGNOSIS — E1122 Type 2 diabetes mellitus with diabetic chronic kidney disease: Secondary | ICD-10-CM

## 2024-05-31 NOTE — Telephone Encounter (Signed)
 Unable to reach pt

## 2024-06-04 ENCOUNTER — Encounter (INDEPENDENT_AMBULATORY_CARE_PROVIDER_SITE_OTHER): Payer: Self-pay

## 2024-06-05 ENCOUNTER — Encounter

## 2024-06-05 DIAGNOSIS — G4733 Obstructive sleep apnea (adult) (pediatric): Secondary | ICD-10-CM

## 2024-06-07 ENCOUNTER — Ambulatory Visit: Payer: Self-pay

## 2024-06-07 NOTE — Telephone Encounter (Signed)
 FYI Only or Action Required?: FYI only for provider.  Patient was last seen in primary care on 05/17/2024 by Elijah Suzzane POUR, MD.  Called Nurse Triage reporting Chest Pain.  Symptoms began yesterday.  Interventions attempted: Nothing.  Symptoms are: unchanged.  Triage Disposition: See HCP Within 4 Hours (Or PCP Triage)  Patient/caregiver understands and will follow disposition?: Yes            Copied from CRM [id number]. Topic: Clinical - Red Word Triage >> Jun 07, 2024  1:53 PM [name] wrote: Red Word that prompted transfer to Nurse Triage: Pain in chest when picking up anything heavy slightly and would like to see what's causing it. Reason for Disposition  [1] MODERATE pain (e.g., interferes with normal activities) AND [2] pain is WORSE with breathing  Answer Assessment - Initial Assessment Questions 1. MECHANISM: How did the injury happen?     Picked up something over 80lbs  2. ONSET: When did the injury happen? (e.g., minutes, hours, days ago)     yesterday 3. LOCATION: Where on the chest is the injury located? Where does it hurt?     Across entire chest 4. CHEST OR RIB PAIN SEVERITY: Is there pain? If Yes, ask: How bad is the pain? (e.g., Scale 0-10; none, mild, moderate, severe)     3/10 5. BREATHING DIFFICULTY: Are you having any difficulty breathing? If Yes, ask: How bad is it?  (e.g., none, mild, moderate, severe)      no 6. OTHER SYMPTOMS (e.g., cough, fever, rash)      no  Protocols used: Chest Injury - Bending, Lifting, or Twisting-A-AH

## 2024-06-08 ENCOUNTER — Encounter: Payer: Self-pay | Admitting: Podiatry

## 2024-06-08 ENCOUNTER — Encounter: Payer: Self-pay | Admitting: Pulmonary Disease

## 2024-06-08 ENCOUNTER — Encounter: Payer: Self-pay | Admitting: Sleep Medicine

## 2024-06-09 ENCOUNTER — Encounter: Payer: Self-pay | Admitting: Family Medicine

## 2024-06-09 NOTE — Telephone Encounter (Signed)
 Second message from 7/29 was routed to Dr. Jess.  Closing this encounter.

## 2024-06-10 ENCOUNTER — Other Ambulatory Visit: Payer: Self-pay

## 2024-06-10 DIAGNOSIS — E291 Testicular hypofunction: Secondary | ICD-10-CM

## 2024-06-10 DIAGNOSIS — Z125 Encounter for screening for malignant neoplasm of prostate: Secondary | ICD-10-CM

## 2024-06-11 ENCOUNTER — Ambulatory Visit: Payer: Self-pay

## 2024-06-11 LAB — HEMOGLOBIN AND HEMATOCRIT, BLOOD
Hematocrit: 48.4 % (ref 37.5–51.0)
Hemoglobin: 15.8 g/dL (ref 13.0–17.7)

## 2024-06-11 LAB — PSA, TOTAL AND FREE
PSA, Free Pct: 24 %
PSA, Free: 0.12 ng/mL
Prostate Specific Ag, Serum: 0.5 ng/mL (ref 0.0–4.0)

## 2024-06-11 LAB — TESTOSTERONE: Testosterone: 231 ng/dL — ABNORMAL LOW (ref 264–916)

## 2024-06-13 DIAGNOSIS — G4733 Obstructive sleep apnea (adult) (pediatric): Secondary | ICD-10-CM

## 2024-06-13 DIAGNOSIS — K148 Other diseases of tongue: Secondary | ICD-10-CM

## 2024-06-13 DIAGNOSIS — R569 Unspecified convulsions: Secondary | ICD-10-CM

## 2024-06-14 NOTE — Telephone Encounter (Signed)
 Spoke with pt and discussed needing another sleep study performed to measure if he is having nighttime seizures or not and he told me he is in the process of being evaluated for [facility] because he's intolerant of the CPAP.

## 2024-06-14 NOTE — Telephone Encounter (Signed)
 Does this patient still need to do the home sleep test with SNAP looks like the equipment has been shipped to him

## 2024-06-14 NOTE — Telephone Encounter (Signed)
 I've never seen this pt before. Can you please route to Dr. Jess if it is for sleep? Thanks.

## 2024-06-16 ENCOUNTER — Other Ambulatory Visit: Payer: Self-pay

## 2024-06-16 ENCOUNTER — Other Ambulatory Visit: Payer: Self-pay | Admitting: Family Medicine

## 2024-06-16 ENCOUNTER — Encounter: Payer: Self-pay | Admitting: Emergency Medicine

## 2024-06-16 ENCOUNTER — Telehealth: Payer: Self-pay | Admitting: Nurse Practitioner

## 2024-06-16 ENCOUNTER — Ambulatory Visit (HOSPITAL_COMMUNITY)
Admission: RE | Admit: 2024-06-16 | Discharge: 2024-06-16 | Disposition: A | Discharge status: Home / Self Care | Source: Ambulatory Visit | Attending: Nurse Practitioner | Admitting: Nurse Practitioner

## 2024-06-16 ENCOUNTER — Ambulatory Visit
Admission: EM | Admit: 2024-06-16 | Discharge: 2024-06-16 | Disposition: A | Discharge status: Home / Self Care | Attending: Nurse Practitioner | Admitting: Nurse Practitioner

## 2024-06-16 DIAGNOSIS — R0789 Other chest pain: Secondary | ICD-10-CM | POA: Diagnosis not present

## 2024-06-16 DIAGNOSIS — E559 Vitamin D deficiency, unspecified: Secondary | ICD-10-CM

## 2024-06-16 HISTORY — DX: Diaphragmatic hernia without obstruction or gangrene: K44.9

## 2024-06-16 HISTORY — DX: Unspecified abdominal hernia without obstruction or gangrene: K46.9

## 2024-06-16 MED ORDER — CYCLOBENZAPRINE HCL 5 MG PO TABS: 5.0000 mg | ORAL_TABLET | Freq: Three times a day (TID) | ORAL | 0 refills | Status: DC | PRN

## 2024-06-16 MED ORDER — TIZANIDINE HCL 4 MG PO TABS: 4.0000 mg | ORAL_TABLET | Freq: Three times a day (TID) | ORAL | 0 refills | Status: DC | PRN

## 2024-06-16 NOTE — ED Provider Notes (Signed)
 [facility]    CSN: [id number] Arrival date & time: 06/16/24  1450      History   Chief Complaint Chief Complaint  Patient presents with   Muscle Pain    HPI Elijah Shaffer is a 43 y.o. male.   The history is provided by the patient.   Patient presents for complaints of right-sided chest pain that has been present for the past couple weeks.  Patient states that he has pain when he is lifting something, most often greater than 40 to 50 pounds.  He denies injury, trauma, shortness of breath, difficulty breathing, swelling, bruising, or deformity.  Patient denies prior history of asthma, or COPD.  States pain also worsens with coughing and deep breathing.  Patient states that he has been told that he needs a CPAP, but cannot tolerate it.  Patient states that he is being explored for other options to treat his sleep apnea.  Patient states he did try something topical to rub across his chest with minimal relief of symptoms.  Patient also reports history of reflux.  States that he takes medication for reflux daily.   Past Medical History:  Diagnosis Date   Abdominal hernia    2025   Acute renal injury (HCC) 06/28/2017   Anxiety    Aspiration pneumonia of right upper lobe due to gastric secretions (HCC)    Diabetes mellitus without complication (HCC)    Diverticulitis    GERD (gastroesophageal reflux disease) 01/08/2018   Hiatal hernia    2022   Hypertension    Prediabetes    Seasonal allergies    Sleep apnea    Syncope 06/26/2021    Patient Active Problem List   Diagnosis Date Noted   Umbilical hernia without obstruction and without gangrene 05/13/2024   Hiatal hernia 04/30/2024   Subclinical hypothyroidism 03/24/2024   Type 2 diabetes mellitus with diabetic neuropathy, without long-term current use of insulin (HCC) 02/04/2024   Callus of foot 02/04/2024   Melena 10/08/2023   Type 2 diabetes mellitus with diabetic chronic kidney disease (HCC) 10/01/2023    Vitamin D  deficiency 10/01/2023   Depression, recurrent (HCC) 10/01/2023   Abdominal bloating 08/19/2023   Pectoralis muscle strain 08/06/2023   Hypercalcemia 08/06/2023   Chronic fatigue 08/06/2023   Chronic kidney disease, stage 3a (HCC) 07/10/2023   Excessive sweating 05/14/2023   Neuropathy 05/14/2023   Hypogonadism, male 03/24/2023   Essential hypertension 03/24/2023   Erythrocytosis 12/03/2022   Encounter for general adult medical examination with abnormal findings 12/02/2022   OSA (obstructive sleep apnea) 12/02/2022   Urinary retention 11/05/2022   Lumbar radiculopathy 09/26/2022   Cervical spinal stenosis 07/29/2022   DDD (degenerative disc disease), lumbar 07/29/2022   NASH (nonalcoholic steatohepatitis) 06/10/2022   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/2022   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   PSVT (paroxysmal supraventricular tachycardia) (HCC) 03/25/2022   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   Allergic rhinitis 01/30/2022   Postural dizziness with presyncope 01/30/2022   Encounter for examination following treatment at hospital 12/31/2021   Chronic sinusitis 12/04/2021   Hyperlipidemia associated with type 2 diabetes mellitus (HCC) 11/30/2021   Esophageal hiatal hernia 11/22/2021   Irritable bowel syndrome with constipation 11/22/2021   Anxiety 06/26/2021   Eczema 06/26/2021   Barrett's esophagus 04/11/2021   History of colonic polyps 04/11/2021   Focal neurological deficit 02/23/2021   GERD (gastroesophageal reflux disease) 01/08/2018    Past Surgical History:  Procedure  Laterality Date   BIOPSY  03/18/2021   Procedure: BIOPSY;  Surgeon: Eartha Angelia Sieving, MD;  Location: [facility];  Service: Gastroenterology;;  esophageal at Z-line   BIOPSY  05/23/2021   Procedure: BIOPSY;  Surgeon: Eartha Angelia Sieving, MD;  Location: [facility];  Service: Gastroenterology;;   BIOPSY  08/19/2023    Procedure: BIOPSY;  Surgeon: Eartha Angelia Sieving, MD;  Location: [facility];  Service: Gastroenterology;;   CARDIOPULMONARY EXERCISE TEST (CPX)  01/09/2022   Interpretation limited by submaximal effort.  Severe functional impairment when compared to max sedentary norms.  No cardiopulmonary limitations.  Noted tremor and generalized weakness that lead to premature exercise termination-consider neuromuscular evaluation.   COLONOSCOPY N/A 09/22/2017   Procedure: COLONOSCOPY;  Surgeon: Golda Claudis PENNER, MD;  Location: [facility];  Service: Endoscopy;  Laterality: N/A;  9:55   COLONOSCOPY WITH PROPOFOL  N/A 05/23/2021   Procedure: COLONOSCOPY WITH PROPOFOL ;  Surgeon: Eartha Angelia Sieving, MD;  Location: [facility];  Service: Gastroenterology;  Laterality: N/A;  7:30   ESOPHAGOGASTRODUODENOSCOPY (EGD) WITH PROPOFOL  N/A 03/18/2021   Procedure: ESOPHAGOGASTRODUODENOSCOPY (EGD) WITH PROPOFOL ;  Surgeon: Eartha Angelia Sieving, MD;  Location: [facility];  Service: Gastroenterology;  Laterality: N/A;   ESOPHAGOGASTRODUODENOSCOPY (EGD) WITH PROPOFOL  N/A 05/23/2021   Procedure: ESOPHAGOGASTRODUODENOSCOPY (EGD) WITH PROPOFOL ;  Surgeon: Eartha Angelia Sieving, MD;  Location: [facility];  Service: Gastroenterology;  Laterality: N/A;   ESOPHAGOGASTRODUODENOSCOPY (EGD) WITH PROPOFOL  N/A 08/19/2023   Procedure: ESOPHAGOGASTRODUODENOSCOPY (EGD) WITH PROPOFOL ;  Surgeon: Eartha Angelia Sieving, MD;  Location: [facility];  Service: Gastroenterology;  Laterality: N/A;  11:45AM;ASA 3   ESOPHAGOGASTRODUODENOSCOPY (EGD) WITH PROPOFOL  N/A 10/08/2023   Procedure: ESOPHAGOGASTRODUODENOSCOPY (EGD) WITH PROPOFOL ;  Surgeon: Eartha Angelia Sieving, MD;  [facility]: [facility];  Service: Gastroenterology;  Laterality: N/A;  1:45PM;ASA 1-2   GIVENS CAPSULE STUDY N/A 10/08/2023   Procedure: GIVENS CAPSULE STUDY;  Surgeon: Eartha Angelia Sieving, MD;  Location: [facility];  Service:  Gastroenterology;  Laterality: N/A;  1:45PM;ASA 1-2   POLYPECTOMY  09/22/2017   Procedure: POLYPECTOMY;  Surgeon: Golda Claudis PENNER, MD;  Location: [facility];  Service: Endoscopy;;   RIGHT/LEFT HEART CATH AND CORONARY ANGIOGRAPHY N/A 12/12/2021   Procedure: RIGHT/LEFT HEART CATH AND CORONARY ANGIOGRAPHY;  Surgeon: Cherrie Sieving SAUNDERS, MD;  Location: [facility];  Service: Cardiovascular:: Normal Coronaries & Hemodynamics.  EF 50-55%. RA = 4 mmHg, RV = 23/4; PA = 22/6 (14) & PCW = 7; Ao sat = 95%; PA sat = 76%, 76%& SVC sat = 74%Fick cardiac output/index = 7.1/3.0; PVR = 1.0 WU   TRANSTHORACIC ECHOCARDIOGRAM  02/24/2021   EF 60 to 65%.  Normal LV function.  Normal valves.  Mild RV dilation with normal function.   ZIO PATCH EVENT MONITOR  01/2022   Sinus rhythm with heart rate range 24 to 159 bpm, 1  AVB and Wenckebach block noted along with rare PACs and PVCs.  No arrhythmias.       Home Medications    Prior to Admission medications   Medication Sig Start Date End Date Taking? Authorizing Provider  albuterol  (VENTOLIN  HFA) 108 (90 Base) MCG/ACT inhaler Inhale 1-2 puffs into the lungs every 6 (six) hours as needed for wheezing or shortness of breath.    [provider]  amLODipine  (NORVASC ) 5 MG tablet Take 1 tablet by mouth once daily 05/31/24   Patel, Rutwik K, MD  b complex vitamins capsule Take 1 capsule by mouth daily. With  D3 gummie    [provider]  benzonatate  (TESSALON ) 100 MG capsule Take 1 capsule (100 mg total) by mouth 3 (three) times daily as needed for cough. Do not take with alcohol or while operating or driving heavy machinery 5/85/74   Chandra Harlene LABOR, NP  Blood Glucose Monitoring Suppl DEVI 1 each by Does not apply route in the morning, at noon, and at bedtime. May substitute to any manufacturer covered by patient's insurance. 10/13/23   Tobie Suzzane POUR, MD  budesonide -formoterol  (SYMBICORT ) 160-4.5 MCG/ACT inhaler Inhale 2 puffs into the  lungs in the morning and at bedtime. 05/20/24   Assaker, Darrin, MD  cetirizine (ZYRTEC) 10 MG tablet Take 10 mg by mouth daily.    [provider]  Cholecalciferol (VITAMIN D -3 PO) Take by mouth daily at 6 (six) AM.    [provider]  clindamycin  (CLEOCIN  T) 1 % external solution Apply topically to acne bumps at aa's qd 06/17/22   Jackquline Sawyer, MD  clindamycin  (CLEOCIN -T) 1 % lotion Apply to affected areas of bumps once daily after shower. 03/17/24   Jackquline Sawyer, MD  clobetasol  (TEMOVATE ) 0.05 % external solution Apply 1 application. topically 2 (two) times daily. Use as directed.  Mix with cerave cream. Use for up to 4 weeks. Avoid applying to face, groin, and axilla. Use as directed. 03/21/22   Jackquline Sawyer, MD  clobetasol  cream (TEMOVATE ) 0.05 % Spot treat more severe areas once to twice daily until improved. Avoid applying to face, groin, and axilla. Use as directed. Long-term use can cause thinning of the skin. 03/17/24   Jackquline Sawyer, MD  Continuous Glucose Sensor (DEXCOM G7 SENSOR) MISC Use it to check blood glucose 3 times before meals, at bedtime and as needed. 04/26/24   Tobie Suzzane POUR, MD  Continuous Glucose Transmitter (DEXCOM G6 TRANSMITTER) MISC Use to check blood sugar daily DX e11.65 10/23/23   Tobie Suzzane POUR, MD  cyclobenzaprine  (FLEXERIL ) 5 MG tablet Take 1 tablet (5 mg total) by mouth at bedtime as needed for muscle spasms. 08/06/23   Tobie Suzzane POUR, MD  diphenhydrAMINE -zinc  acetate (BENADRYL  EXTRA STRENGTH) cream Apply 1 application topically 3 (three) times daily as needed for itching. 12/07/21   Paseda, Folashade R, FNP  docusate sodium  (COLACE) 100 MG capsule Take 100 mg by mouth 2 (two) times daily.    [provider]  doxycycline  (VIBRAMYCIN ) 50 MG capsule Take 3 capsules (150 mg total) by mouth daily. Take with food. 05/25/24   Stewart, Tara, MD  DULoxetine (CYMBALTA) 30 MG capsule Take 30 mg by mouth 2 (two) times daily. 01/05/24   [provider]  fenofibrate  (TRICOR ) 145 MG tablet Take 1 tablet (145 mg total) by mouth daily. 04/26/24   Anner Alm ORN, MD  fluocinonide  (LIDEX ) 0.05 % external solution Apply once or twice daily to affected areas on scalp up to 2 weeks as needed for itching. Avoid applying to face, groin, and axilla. 06/11/22   Jackquline Sawyer, MD  fluticasone  (FLONASE  ALLERGY RELIEF) 50 MCG/ACT nasal spray Place 2 sprays into both nostrils daily as needed for allergies or rhinitis. 11/14/23   Tobie Suzzane POUR, MD  fluticasone  (FLONASE ) 50 MCG/ACT nasal spray Place 1 spray into both nostrils daily. 05/20/24 05/20/25  Assaker, Darrin, MD  gabapentin (NEURONTIN) 300 MG capsule Take 300 mg by mouth 3 (three) times daily.    [provider]  Glucose Blood (BLOOD GLUCOSE TEST STRIPS) STRP 1 each by In Vitro route in the morning,  at noon, and at bedtime. May substitute to any manufacturer covered by patient's insurance. 10/13/23   Tobie Suzzane POUR, MD  hydrOXYzine  (ATARAX ) 25 MG tablet Take 1-2 tablets by mouth at bedtime as needed for itch. 03/17/24   Jackquline Sawyer, MD  hydrOXYzine  (VISTARIL ) 25 MG capsule TAKE 1 TO 2 CAPSULES BY MOUTH AT BEDTIME FOR ITCHING (  CAN  CAUSE  DROWSINESS) 03/08/24   Jackquline Sawyer, MD  JARDIANCE  10 MG TABS tablet TAKE 1 TABLET BY MOUTH ONCE DAILY BEFORE BREAKFAST 05/31/24   Tobie Suzzane POUR, MD  ketoconazole  (NIZORAL ) 2 % shampoo Massage into scalp 2-3 times a week, let sit several minutes before rinsing. 03/17/24   Jackquline Sawyer, MD  lubiprostone  (AMITIZA ) 24 MCG capsule TAKE 1 CAPSULE BY MOUTH TWICE DAILY WITH A MEAL 12/29/23   Carlan, Chelsea L, NP  montelukast  (SINGULAIR ) 10 MG tablet TAKE 1 TABLET BY MOUTH AT BEDTIME 08/12/23   Kara Dorn NOVAK, MD  olmesartan (BENICAR) 5 MG tablet Take 5 mg by mouth daily. 12/17/23   [provider]  omeprazole  (PRILOSEC) 40 MG capsule Take 1 capsule by mouth twice daily 05/31/24   Carlan, Chelsea L, NP  ondansetron  (ZOFRAN ) 4 MG tablet Take 1  tablet (4 mg total) by mouth every 6 (six) hours as needed for nausea. 06/04/21   Arloa Suzen RAMAN, NP  propranolol  (INDERAL ) 20 MG tablet Take one tablet 20 mg  twice daily  may take an  additional dose of 20 mg  as needed for palpitations 03/25/22   Anner Alm ORN, MD  rosuvastatin  (CRESTOR ) 40 MG tablet Take 1 tablet (40 mg total) by mouth daily. 04/26/24   Anner Alm ORN, MD  Testosterone  25 MG/2.5GM (1%) GEL Place 5 g onto the skin every morning. 1 packet to each shoulder q am. 04/15/24   Watt Rush, MD  triamcinolone  cream (KENALOG ) 0.1 %  03/07/22   [provider]  Vitamin D , Ergocalciferol , (DRISDOL ) 1.25 MG (50000 UNIT) CAPS capsule Take 1 capsule (50,000 Units total) by mouth every 7 (seven) days. 10/01/23   Zarwolo, Gloria, FNP    Family History Family History  Problem Relation Age of Onset   Healthy Mother    Cervical cancer Mother    Cancer Father    Colon cancer Maternal Grandmother    Colon cancer Maternal Grandfather     Social History Social History   Tobacco Use   Smoking status: Never    Passive exposure: Past   Smokeless tobacco: Never  Vaping Use   Vaping status: Never Used  Substance Use Topics   Alcohol use: Not Currently    Comment: occasionally   Drug use: No     Allergies   Gramineae pollens, Claritin [loratadine], Loratadine-pseudoephedrine er, and Pollen extract   Review of Systems Review of Systems Per HPI  Physical Exam Triage Vital Signs ED Triage Vitals [06/16/24 1502]  Encounter Vitals Group     BP 119/81     Girls Systolic BP Percentile      Girls Diastolic BP Percentile      Boys Systolic BP Percentile      Boys Diastolic BP Percentile      Pulse Rate 61     Resp 20     Temp 98 F (36.7 C)     Temp Source Oral     SpO2 94 %     Weight      Height      Head Circumference  Peak Flow      Pain Score 2     Pain Loc      Pain Education      Exclude from Growth Chart    No data found.  Updated Vital  Signs BP 119/81 (BP Location: Right Arm)   Pulse 61   Temp 98 F (36.7 C) (Oral)   Resp 20   SpO2 94%   Visual Acuity Right Eye Distance:   Left Eye Distance:   Bilateral Distance:    Right Eye Near:   Left Eye Near:    Bilateral Near:     Physical Exam Vitals and nursing note reviewed.  Constitutional:      General: He is not in acute distress.    Appearance: Normal appearance.  HENT:     Head: Normocephalic.  Eyes:     Extraocular Movements: Extraocular movements intact.     Pupils: Pupils are equal, round, and reactive to light.  Cardiovascular:     Rate and Rhythm: Normal rate and regular rhythm.     Pulses: Normal pulses.     Heart sounds: Normal heart sounds.  Pulmonary:     Effort: Pulmonary effort is normal. No respiratory distress.     Breath sounds: Normal breath sounds. No stridor. No wheezing, rhonchi or rales.  Chest:     Chest wall: Tenderness present.    Abdominal:     General: Bowel sounds are normal.     Palpations: Abdomen is soft.     Tenderness: There is no abdominal tenderness.  Musculoskeletal:     Cervical back: Normal range of motion.  Skin:    General: Skin is warm and dry.  Neurological:     General: No focal deficit present.     Mental Status: He is alert and oriented to person, place, and time.  Psychiatric:        Mood and Affect: Mood normal.        Behavior: Behavior normal.      UC Treatments / Results  Labs (all labs ordered are listed, but only abnormal results are displayed) Labs Reviewed - No data to display  EKG: NSR, no ectopy, no STEMI.  Compared to EKGs dated 04/26/24, 08/11/2023, and 12/23/2022.   Radiology No results found.  Procedures Procedures (including critical care time)  Medications Ordered in UC Medications - No data to display  Initial Impression / Assessment and Plan / UC Course  I have reviewed the triage vital signs and the nursing notes.  Pertinent labs & imaging results that were available  during my care of the patient were reviewed by me and considered in my medical decision making (see chart for details).  Patient with left-sided chest tenderness has been present for the past 1 to 2 weeks.  On exam, lung sounds are clear throughout, room air sats at 94%.  EKG performed, which showed normal sinus rhythm, no ectopy, no STEMI.  Chest x-ray is pending.  If chest x-ray is negative, symptoms are most likely of musculoskeletal etiology.  Will start patient Flexeril  5mg  for pain and spasm.  Supportive care recommendations were provided discussed with the patient to include fluids, rest, over-the-counter analgesics, and use of warm compresses to help with pain or discomfort.  Patient was advised if symptoms fail to improve over the next 2 to 3 weeks, recommend follow-up with his PCP for further evaluation.  Patient was in agreement with this plan of care and verbalizes understanding.  All questions were answered.  Patient stable for discharge.  Final Clinical Impressions(s) / UC Diagnoses   Final diagnoses:  None   Discharge Instructions   None    ED Prescriptions   None    PDMP not reviewed this encounter.   Gilmer Etta PARAS, NP 06/16/24 1555

## 2024-06-16 NOTE — Discharge Instructions (Addendum)
 You will need to go to [facility] for a chest x-ray.  Please go to the main entrance of the hospital to get to the radiology department.  You will be contacted when the results of the x-ray are received.  You also have access to your results via MyChart. Your EKG was negative. Take medication as prescribed. You may also take over-the-counter Tylenol  or ibuprofen  as needed for pain, fever, or general discomfort. Apply warm compresses to the affected area to help with pain or discomfort. As discussed, if symptoms fail to improve over the next 2 to 3 weeks, or appear to worsen, please follow-up with your primary care physician for further evaluation. Follow-up as needed.

## 2024-06-16 NOTE — Telephone Encounter (Signed)
 Call patient to discuss chest x-ray result.  Reached patient's voicemail, left message asking patient to return the phone call.

## 2024-06-16 NOTE — ED Triage Notes (Signed)
 Pt reports intermittent muscle tightness in chest with lifting things for last several weeks. NAD noted. Denies any known injury or trauma.

## 2024-06-17 ENCOUNTER — Ambulatory Visit: Payer: Self-pay

## 2024-06-17 ENCOUNTER — Telehealth: Payer: Self-pay

## 2024-06-17 DIAGNOSIS — R069 Unspecified abnormalities of breathing: Secondary | ICD-10-CM | POA: Diagnosis not present

## 2024-06-17 NOTE — Telephone Encounter (Signed)
 Copied from CRM [id number]. Topic: Clinical - Lab/Test Results >> Jun 17, 2024  4:42 PM Rozanna MATSU wrote: Reason for CRM: PT CALLED ABOUT THE RESULTS FRM HIS HOME SLEEP STUDY AND STAED HE IS SUPPOSE TO HAVE ANOTHER ON DONE AT THE HOSPTAL BUT HAS NOT BEEN CONTACTED TO SCHEDULE.

## 2024-06-17 NOTE — Telephone Encounter (Signed)
 Pt returned call. Advised of negative results and f/u with PCP per NP instruction. Questions answered. Verbalized understanding.

## 2024-06-18 ENCOUNTER — Ambulatory Visit: Payer: Self-pay

## 2024-06-18 NOTE — Telephone Encounter (Signed)
 Copied from CRM [id number]. Topic: Clinical - Lab/Test Results >> Jun 17, 2024  4:42 PM Rozanna MATSU wrote: Reason for CRM: PT CALLED ABOUT THE RESULTS FRM HIS HOME SLEEP STUDY AND STAED HE IS SUPPOSE TO HAVE ANOTHER ON DONE AT THE HOSPTAL BUT HAS NOT BEEN CONTACTED TO SCHEDULE.

## 2024-06-18 NOTE — Telephone Encounter (Signed)
 Closing notes. Spoke with pt.

## 2024-06-18 NOTE — Telephone Encounter (Signed)
 I have spoke with Mr. Elijah Shaffer and he is aware I will be faxing information to [facility] for him to do in lab sleep study. When I gave the form to Dr. Jess to sign she stated to hold off she wanted to review the chart and HST results. Patient is wanting HST results

## 2024-06-23 ENCOUNTER — Ambulatory Visit: Payer: Self-pay | Admitting: Family Medicine

## 2024-06-24 ENCOUNTER — Ambulatory Visit: Payer: Self-pay

## 2024-06-24 NOTE — Telephone Encounter (Signed)
 Patient informed to stop olmesartan per Dr Tobie as well as hydrate and prevent sudden movement.  Patient is pulling weeds today and gets dizzy when he stands up.  Suggested patient to hold off on this for a few days.

## 2024-06-24 NOTE — Telephone Encounter (Signed)
 Appointment made for 06/28/2024 at 4 PM with patient's PCP with Dr Elijah LIPS Only or Action Required?: FYI only for provider.  Patient was last seen in primary care on 05/17/2024 by Elijah Suzzane POUR, MD.  Called Nurse Triage reporting Dizziness.  Symptoms began over a month ago.  Interventions attempted: Rest, hydration, or home remedies and Other: patient spoke with his pcp about this a month ago.  Symptoms are: have no happened since he last spoke with pcp about it.  Triage Disposition: See PCP Within 2 Weeks  Patient/caregiver understands and will follow disposition?: Yes                 Copied from CRM #[id number]. Topic: Clinical - Red Word Triage >> Jun 24, 2024 12:57 PM Avram MATSU wrote: Red Word that prompted transfer to Nurse Triage: blood pressure dropped fast, feels like he's gong to pass out. Reason for Disposition  Dizziness is a chronic symptom (recurrent or ongoing AND present > 4 weeks)  Answer Assessment - Initial Assessment Questions Hasn't happened in over a month Insurance Nurse did an assessment a month or so ago and patient's blood pressure dropped when he stood up. Patient states he has told his PCP Dr Elijah about it afterwards and he was advised to drink more and patient states that he drinks water  all the time.  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 Patient verbalized understanding.    1. DESCRIPTION: Describe your dizziness.     Feels like he might pass out every time he stands up from the [employer] work 2. LIGHTHEADED: Do you feel lightheaded? (e.g., somewhat faint, woozy, weak upon standing)     Only when standing from kneeling on the ground 3. VERTIGO: Do  you feel like either you or the room is spinning or tilting? (i.e., vertigo)     unsure 4. SEVERITY: How bad is it?  Do you feel like you are going to faint? Can you stand and walk?     When it happens it is hard to stand or walk 5. ONSET:  When did the dizziness begin?     Over a month ago 6. AGGRAVATING FACTORS: Does anything make it worse? (e.g., standing, change in head position)     Standing from being on the ground 7. HEART RATE: Can you tell me your heart rate? How many beats in 15 seconds?  (Note: Not all patients can do this.)       ----- 8. CAUSE: What do you think is causing the dizziness? (e.g., decreased fluids or food, diarrhea, emotional distress, heat exposure, new medicine, sudden standing, vomiting; unknown)     unknown 9. RECURRENT SYMPTOM: Have you had dizziness before? If Yes, ask: When was the last time? What happened that time?     It's been over a month 10. OTHER SYMPTOMS: Do you have any other symptoms? (e.g., fever, chest pain, vomiting, diarrhea, bleeding)       Patient denies  Protocols used: Dizziness - Lightheadedness-A-AH

## 2024-06-25 ENCOUNTER — Encounter: Payer: Self-pay | Admitting: Internal Medicine

## 2024-06-26 ENCOUNTER — Other Ambulatory Visit (INDEPENDENT_AMBULATORY_CARE_PROVIDER_SITE_OTHER): Payer: Self-pay | Admitting: Gastroenterology

## 2024-06-28 ENCOUNTER — Ambulatory Visit (INDEPENDENT_AMBULATORY_CARE_PROVIDER_SITE_OTHER): Payer: Self-pay | Admitting: Internal Medicine

## 2024-06-28 ENCOUNTER — Encounter: Payer: Self-pay | Admitting: Internal Medicine

## 2024-06-28 ENCOUNTER — Ambulatory Visit: Admitting: Urology

## 2024-06-28 VITALS — BP 130/80 | HR 85 | Ht 75.0 in | Wt 270.8 lb

## 2024-06-28 VITALS — BP 120/68 | HR 109

## 2024-06-28 DIAGNOSIS — N1831 Chronic kidney disease, stage 3a: Secondary | ICD-10-CM | POA: Diagnosis not present

## 2024-06-28 DIAGNOSIS — I10 Essential (primary) hypertension: Secondary | ICD-10-CM

## 2024-06-28 DIAGNOSIS — R42 Dizziness and giddiness: Secondary | ICD-10-CM | POA: Diagnosis not present

## 2024-06-28 DIAGNOSIS — R55 Syncope and collapse: Secondary | ICD-10-CM | POA: Diagnosis not present

## 2024-06-28 DIAGNOSIS — E291 Testicular hypofunction: Secondary | ICD-10-CM

## 2024-06-28 LAB — URINALYSIS, ROUTINE W REFLEX MICROSCOPIC
Bilirubin, UA: NEGATIVE
Ketones, UA: NEGATIVE
Leukocytes,UA: NEGATIVE
Nitrite, UA: NEGATIVE
RBC, UA: NEGATIVE
Specific Gravity, UA: 1.015 (ref 1.005–1.030)
Urobilinogen, Ur: 0.2 mg/dL (ref 0.2–1.0)
pH, UA: 6 (ref 5.0–7.5)

## 2024-06-28 MED ORDER — TESTOSTERONE 25 MG/2.5GM (1%) TD GEL: 5.0000 g | Freq: Every morning | TRANSDERMAL | 5 refills | Status: AC

## 2024-06-28 NOTE — Progress Notes (Unsigned)
 06/28/2024 1:47 PM   Elijah Shaffer [place] 02/06/1981 [id number]  Referring provider: Tobie Suzzane POUR, MD [address] [place],  [place] [address]  No chief complaint on file.   HPI:  CC: 1. Hypogonadism in male   2. Prostate cancer screening   3. Other fatigue    06/28/2024: He continues topical T replacement. Feb 2025 T 470. May 2025 Hct 50.9. His Jul 2025 T 231, Hct 48, PSA 0.5.  He does lawn care.   11/13/23: He is back on a topical TRT agent.  His energy level is good.  His Hgb is 16.2 and the T is 318. He has no ED but is not very active.   He is voiding well with an IPSS of 2.  He has 3+ glucose on UA today.    07/10/23:  He was prescribed Testim  at his last visit but it wasn't covered so he has transitioned to injections.  .His T prior to this visit was 723 with a  Hgb of 16.8.  He has had some bleeding with the injections and is interested in looking at the injections again.  His last Cr was up to 2.18 with a GFR of 38 at is last visit which was worse than in May when it was 58.     04/10/23: His repeat T on 02/06/23 was 356 with a free T of 91.6 but a repeat prior to this visit is 254.  LH/FSH are on the lower side of normal and his prolactin and estradiol  were normal. Sent for low testosterone  of 157 on 12/24/22. He was having fatigue and reduced energy.  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  He doesn't report issues with reduced libido or erections but he is not active. His Hgb was 18.2 on 12/23/22 but it was back down to 16.1 on 01/07/23 (felt to be related to improper use of his CPAP that he is on for OSA).  He is on gabapentin for sciatica. His TSH was normal on 12/02/22.  He has CKD3 and hyperlipidemia.  His IPSS is 1. With nocturia x 1.     PMH: Past Medical History:  Diagnosis Date   Abdominal hernia    2025   Acute renal injury (HCC) 06/28/2017   Anxiety    Aspiration pneumonia of right upper lobe due to  gastric secretions (HCC)    Diabetes mellitus without complication (HCC)    Diverticulitis    GERD (gastroesophageal reflux disease) 01/08/2018   Hiatal hernia    2022   Hypertension    Prediabetes    Seasonal allergies    Sleep apnea    Syncope 06/26/2021    Surgical History: Past Surgical History:  Procedure Laterality Date   BIOPSY  03/18/2021   Procedure: BIOPSY;  Surgeon: Eartha Angelia Sieving, MD;  [facility]: [facility];  Service: Gastroenterology;;  esophageal at Z-line   BIOPSY  05/23/2021   Procedure: BIOPSY;  Surgeon: Eartha Angelia Sieving, MD;  [facility]: [facility];  Service: Gastroenterology;;   BIOPSY  08/19/2023   Procedure: BIOPSY;  Surgeon: Eartha Angelia Sieving, MD;  [facility];  Service: Gastroenterology;;   CARDIOPULMONARY EXERCISE TEST (CPX)  01/09/2022   Interpretation limited by submaximal effort.  Severe functional impairment when compared to max sedentary norms.  No cardiopulmonary limitations.  Noted tremor and generalized weakness that lead to premature exercise termination-consider neuromuscular evaluation.   COLONOSCOPY N/A 09/22/2017  Procedure: COLONOSCOPY;  Surgeon: Golda Claudis PENNER, MD;  [facility]: [facility];  Service: Endoscopy;  Laterality: N/A;  9:55   COLONOSCOPY WITH PROPOFOL  N/A 05/23/2021   Procedure: COLONOSCOPY WITH PROPOFOL ;  Surgeon: Eartha Angelia Sieving, MD;  [facility];  Service: Gastroenterology;  Laterality: N/A;  7:30   ESOPHAGOGASTRODUODENOSCOPY (EGD) WITH PROPOFOL  N/A 03/18/2021   Procedure: ESOPHAGOGASTRODUODENOSCOPY (EGD) WITH PROPOFOL ;  Surgeon: Eartha Angelia Sieving, MD;  [facility];  Service: Gastroenterology;  Laterality: N/A;   ESOPHAGOGASTRODUODENOSCOPY (EGD) WITH PROPOFOL  N/A 05/23/2021   Procedure: ESOPHAGOGASTRODUODENOSCOPY (EGD) WITH PROPOFOL ;  Surgeon: Eartha Angelia Sieving, MD;  [facility];  Service: Gastroenterology;  Laterality: N/A;    ESOPHAGOGASTRODUODENOSCOPY (EGD) WITH PROPOFOL  N/A 08/19/2023   Procedure: ESOPHAGOGASTRODUODENOSCOPY (EGD) WITH PROPOFOL ;  Surgeon: Eartha Angelia Sieving, MD;  [facility];  Service: Gastroenterology;  Laterality: N/A;  11:45AM;ASA 3   ESOPHAGOGASTRODUODENOSCOPY (EGD) WITH PROPOFOL  N/A 10/08/2023   Procedure: ESOPHAGOGASTRODUODENOSCOPY (EGD) WITH PROPOFOL ;  Surgeon: Eartha Angelia Sieving, MD;  [facility];  Service: Gastroenterology;  Laterality: N/A;  1:45PM;ASA 1-2   GIVENS CAPSULE STUDY N/A 10/08/2023   Procedure: GIVENS CAPSULE STUDY;  Surgeon: Eartha Angelia Sieving, MD;  [facility];  Service: Gastroenterology;  Laterality: N/A;  1:45PM;ASA 1-2   POLYPECTOMY  09/22/2017   Procedure: POLYPECTOMY;  Surgeon: Golda Claudis PENNER, MD;  [facility];  Service: Endoscopy;;   RIGHT/LEFT HEART CATH AND CORONARY ANGIOGRAPHY N/A 12/12/2021   Procedure: RIGHT/LEFT HEART CATH AND CORONARY ANGIOGRAPHY;  Surgeon: Cherrie Sieving SAUNDERS, MD;  [facility]: [facility];  Service: Cardiovascular:: Normal Coronaries & Hemodynamics.  EF 50-55%. RA = 4 mmHg, RV = 23/4; PA = 22/6 (14) & PCW = 7; Ao sat = 95%; PA sat = 76%, 76%& SVC sat = 74%Fick cardiac output/index = 7.1/3.0; PVR = 1.0 WU   TRANSTHORACIC ECHOCARDIOGRAM  02/24/2021   EF 60 to 65%.  Normal LV function.  Normal valves.  Mild RV dilation with normal function.   ZIO PATCH EVENT MONITOR  01/2022   Sinus rhythm with heart rate range 24 to 159 bpm, 1  AVB and Wenckebach block noted along with rare PACs and PVCs.  No arrhythmias.    Home Medications:  Allergies as of 06/28/2024       Reactions   Gramineae Pollens Itching   Claritin [loratadine] Other (See Comments)   Heart race   Loratadine-pseudoephedrine Er Palpitations   Pollen Extract Other (See Comments)   Seasonal allergies        Medication List        Accurate as of June 28, 2024  1:47 PM. If you have any questions, ask your  nurse or doctor.          albuterol  108 (90 Base) MCG/ACT inhaler Commonly known as: VENTOLIN  HFA Inhale 1-2 puffs into the lungs every 6 (six) hours as needed for wheezing or shortness of breath.   amLODipine  5 MG tablet Commonly known as: NORVASC  Take 1 tablet by mouth once daily   b complex vitamins capsule Take 1 capsule by mouth daily. With D3 gummie   benzonatate  100 MG capsule Commonly known as: TESSALON  Take 1 capsule (100 mg total) by mouth 3 (three) times daily as needed for cough. Do not take with alcohol or while operating or driving heavy machinery   Blood Glucose Monitoring Suppl Devi 1 each by Does not apply route in the morning, at noon, and at bedtime. May substitute  to any manufacturer covered by patient's insurance.   BLOOD GLUCOSE TEST STRIPS Strp 1 each by In Vitro route in the morning, at noon, and at bedtime. May substitute to any manufacturer covered by patient's insurance.   budesonide -formoterol  160-4.5 MCG/ACT inhaler Commonly known as: Symbicort  Inhale 2 puffs into the lungs in the morning and at bedtime.   cetirizine 10 MG tablet Commonly known as: ZYRTEC Take 10 mg by mouth daily.   clindamycin  1 % external solution Commonly known as: CLEOCIN  T Apply topically to acne bumps at aa's qd   clindamycin  1 % lotion Commonly known as: Cleocin -T Apply to affected areas of bumps once daily after shower.   clobetasol  0.05 % external solution Commonly known as: TEMOVATE  Apply 1 application. topically 2 (two) times daily. Use as directed.  Mix with cerave cream. Use for up to 4 weeks. Avoid applying to face, groin, and axilla. Use as directed.   clobetasol  cream 0.05 % Commonly known as: TEMOVATE  Spot treat more severe areas once to twice daily until improved. Avoid applying to face, groin, and axilla. Use as directed. Long-term use can cause thinning of the skin.   cyclobenzaprine  5 MG tablet Commonly known as: FLEXERIL  Take 1 tablet (5 mg  total) by mouth 3 (three) times daily as needed for muscle spasms.   Dexcom G6 Transmitter Misc Use to check blood sugar daily DX e11.65   Dexcom G7 Sensor Misc Use it to check blood glucose 3 times before meals, at bedtime and as needed.   diphenhydrAMINE -zinc  acetate cream Commonly known as: Benadryl  Extra Strength Apply 1 application topically 3 (three) times daily as needed for itching.   docusate sodium  100 MG capsule Commonly known as: COLACE Take 100 mg by mouth 2 (two) times daily.   doxycycline  50 MG capsule Commonly known as: VIBRAMYCIN  Take 3 capsules (150 mg total) by mouth daily. Take with food.   DULoxetine 30 MG capsule Commonly known as: CYMBALTA Take 30 mg by mouth 2 (two) times daily.   fenofibrate  145 MG tablet Commonly known as: TRICOR  Take 1 tablet (145 mg total) by mouth daily.   fluocinonide  0.05 % external solution Commonly known as: LIDEX  Apply once or twice daily to affected areas on scalp up to 2 weeks as needed for itching. Avoid applying to face, groin, and axilla.   fluticasone  50 MCG/ACT nasal spray Commonly known as: Flonase  Allergy Relief Place 2 sprays into both nostrils daily as needed for allergies or rhinitis.   fluticasone  50 MCG/ACT nasal spray Commonly known as: FLONASE  Place 1 spray into both nostrils daily.   gabapentin 300 MG capsule Commonly known as: NEURONTIN Take 300 mg by mouth 3 (three) times daily.   hydrOXYzine  25 MG capsule Commonly known as: VISTARIL  TAKE 1 TO 2 CAPSULES BY MOUTH AT BEDTIME FOR ITCHING (  CAN  CAUSE  DROWSINESS)   hydrOXYzine  25 MG tablet Commonly known as: ATARAX  Take 1-2 tablets by mouth at bedtime as needed for itch.   Jardiance  10 MG Tabs tablet Generic drug: empagliflozin  TAKE 1 TABLET BY MOUTH ONCE DAILY BEFORE BREAKFAST   ketoconazole  2 % shampoo Commonly known as: NIZORAL  Massage into scalp 2-3 times a week, let sit several minutes before rinsing.   lubiprostone  24 MCG  capsule Commonly known as: AMITIZA  TAKE 1 CAPSULE BY MOUTH TWICE DAILY WITH A MEAL   montelukast  10 MG tablet Commonly known as: SINGULAIR  TAKE 1 TABLET BY MOUTH AT BEDTIME   omeprazole  40 MG capsule Commonly known as:  PRILOSEC Take 1 capsule by mouth twice daily   ondansetron  4 MG tablet Commonly known as: ZOFRAN  Take 1 tablet (4 mg total) by mouth every 6 (six) hours as needed for nausea.   propranolol  20 MG tablet Commonly known as: INDERAL  Take one tablet 20 mg  twice daily  may take an  additional dose of 20 mg  as needed for palpitations   rosuvastatin  40 MG tablet Commonly known as: CRESTOR  Take 1 tablet (40 mg total) by mouth daily.   Testosterone  25 MG/2.5GM (1%) Gel Place 5 g onto the skin every morning. 1 packet to each shoulder q am.   triamcinolone  cream 0.1 % Commonly known as: KENALOG    Vitamin D  (Ergocalciferol ) 1.25 MG (50000 UNIT) Caps capsule Commonly known as: DRISDOL  Take 1 capsule (50,000 Units total) by mouth every 7 (seven) days.   VITAMIN D -3 PO Take by mouth daily at 6 (six) AM.        Allergies:  Allergies  Allergen Reactions   Gramineae Pollens Itching   Claritin [Loratadine] Other (See Comments)    Heart race   Loratadine-Pseudoephedrine Er Palpitations   Pollen Extract Other (See Comments)    Seasonal allergies    Family History: Family History  Problem Relation Age of Onset   Healthy Mother    Cervical cancer Mother    Cancer Father    Colon cancer Maternal Grandmother    Colon cancer Maternal Grandfather     Social History:  reports that he has never smoked. He has been exposed to tobacco smoke. He has never used smokeless tobacco. He reports that he does not currently use alcohol. He reports that he does not use drugs.   Physical Exam: BP 120/68   Pulse (!) 109   Constitutional:  Alert and oriented, No acute distress. HEENT: [place] AT, moist mucus membranes.  Trachea midline, no masses. Cardiovascular: No clubbing,  cyanosis, or edema. Respiratory: Normal respiratory effort, no increased work of breathing. GI: Abdomen is soft, nontender, nondistended, no abdominal masses GU: No CVA tenderness Lymph: No cervical or inguinal lymphadenopathy. Skin: No rashes, bruises or suspicious lesions. Neurologic: Grossly intact, no focal deficits, moving all 4 extremities. Psychiatric: Normal mood and affect.  Laboratory Data: Lab Results  Component Value Date   WBC 12.2 (H) 04/07/2024   HGB 15.8 06/10/2024   HCT 48.4 06/10/2024   MCV 88 04/07/2024   PLT 274 04/07/2024    Lab Results  Component Value Date   CREATININE 1.23 04/07/2024    No results found for: PSA  Lab Results  Component Value Date   TESTOSTERONE  231 (L) 06/10/2024    Lab Results  Component Value Date   HGBA1C 6.3 (H) 04/07/2024    Urinalysis    Component Value Date/Time   COLORURINE YELLOW 08/08/2023 1322   APPEARANCEUR Clear 11/13/2023 1506   LABSPEC 1.017 08/08/2023 1322   PHURINE 6.0 08/08/2023 1322   GLUCOSEU 3+ (A) 11/13/2023 1506   HGBUR NEGATIVE 08/08/2023 1322   BILIRUBINUR Negative 11/13/2023 1506   KETONESUR NEGATIVE 08/08/2023 1322   PROTEINUR Negative 11/13/2023 1506   PROTEINUR NEGATIVE 08/08/2023 1322   UROBILINOGEN 0.2 04/18/2010 2028   NITRITE Negative 11/13/2023 1506   NITRITE NEGATIVE 08/08/2023 1322   LEUKOCYTESUR Negative 11/13/2023 1506   LEUKOCYTESUR NEGATIVE 08/08/2023 1322    Lab Results  Component Value Date   LABMICR Comment 11/13/2023   WBCUA None seen 02/06/2023   LABEPIT 0-10 02/06/2023   MUCUS Present (A) 02/06/2023   BACTERIA None  seen 02/06/2023    Pertinent Imaging:  Results for orders placed during the hospital encounter of 03/14/22  DG Abd 1 View  Narrative CLINICAL DATA:  Abdominal distension.  EXAM: ABDOMEN - 1 VIEW  COMPARISON:  CT abdomen 02/20/2022  FINDINGS: Stool throughout the colon. Gas within nondilated loops of large and small bowel. No definite free  intraperitoneal air. Osseous structures unremarkable.  IMPRESSION: Stool throughout the colon compatible with constipation.   Electronically Signed By: Bard Moats M.D. On: 03/14/2022 15:50  No results found for this or any previous visit.  No results found for this or any previous visit.  No results found for this or any previous visit.  Results for orders placed during the hospital encounter of 08/12/23  US  RENAL  Narrative CLINICAL DATA:  Chronic renal disease  EXAM: RENAL / URINARY TRACT ULTRASOUND COMPLETE  COMPARISON:  CT abdomen pelvis 08/08/2023  FINDINGS: Right Kidney:  Renal measurements: 12.4 x 7.0 x 6.7 cm = volume: 302.2 mL. Echogenicity within normal limits. No mass or hydronephrosis visualized.  Left Kidney:  Renal measurements: 12.7 x 5.8 x 5.5 cm = volume: 210.9 mL. Echogenicity within normal limits. No mass or hydronephrosis visualized.  Bladder:  Appears normal for degree of bladder distention.  Other:  Prevoid: 332.7 cc  Postvoid: 0 cc  IMPRESSION: No hydronephrosis.   Electronically Signed By: Bard Moats M.D. On: 08/12/2023 16:23  No results found for this or any previous visit.  No results found for this or any previous visit.  Results for orders placed during the hospital encounter of 02/20/22  CT RENAL STONE STUDY  Narrative CLINICAL DATA:  Bilateral flank pain right greater than left x1 week concern for renal stone.  EXAM: CT ABDOMEN AND PELVIS WITHOUT CONTRAST  TECHNIQUE: Multidetector CT imaging of the abdomen and pelvis was performed following the standard protocol without IV contrast.  RADIATION DOSE REDUCTION: This exam was performed according to the departmental dose-optimization program which includes automated exposure control, adjustment of the mA and/or kV according to patient size and/or use of iterative reconstruction technique.  COMPARISON:  Multiple priors including most recent CT February 23, 2021  FINDINGS: Lower chest: No acute abnormality. Stable moderate to large type 3 hiatal hernia.  Hepatobiliary: Diffuse hepatic steatosis with focal fatty sparing along the falciform ligament, gallbladder fossa and hepatic hilum. Gallbladder is unremarkable. No biliary ductal dilation.  Pancreas: No pancreatic ductal dilation or evidence of acute inflammation.  Spleen: No splenomegaly or focal splenic lesion.  Adrenals/Urinary Tract: Bilateral adrenal glands appear normal. No hydronephrosis. No contour deforming renal mass. Urinary bladder is unremarkable for degree of distension.  Stomach/Bowel: No enteric contrast was administered. No pathologic dilation of small or large bowel. The appendix and terminal ileum appear normal. Pancolonic diverticulosis without evidence of acute diverticulitis. No evidence of acute bowel inflammation.  Vascular/Lymphatic: Normal caliber abdominal aorta. No pathologically enlarged abdominal or pelvic lymph nodes.  Reproductive: Prostate is unremarkable.  Other: No significant abdominopelvic free fluid.  Musculoskeletal: Multilevel degenerative changes spine. Similar mild anterior wedging of lower thoracic vertebral bodies. No acute osseous abnormality.  IMPRESSION: 1. No acute abdominopelvic abnormality. Specifically, no evidence of obstructive uropathy. 2. Pancolonic diverticulosis without evidence of acute diverticulitis. 3. Diffuse hepatic steatosis, which is in the setting of steatohepatitis can be a cause of abdominal pain consider correlation with laboratory values. 4. Stable moderate to large type 3 hiatal hernia.   Electronically Signed By: Reyes Holder M.D. On: 02/20/2022 16:31   Assessment &  Plan:    1. Hypogonadism in male (Primary) Doing well - continue T replacement. See back in 1 year.   - Urinalysis, Routine w reflex microscopic   No follow-ups on file.  Donnice Brooks, MD  [facility]  [address] [place], [place] [address] [phone]

## 2024-06-28 NOTE — Progress Notes (Signed)
 Acute Office Visit  Subjective:    Patient ID: Elijah Shaffer, male    DOB: November 06, 1981, 43 y.o.   MRN: [id number]  Chief Complaint  Patient presents with   Dizziness    Feeling lightheaded and vision blacks out .     HPI Patient is in today for complaint of dizzy spells while working outdoors.  He reports feeling lightheaded, especially when he tries to get up from the sitting position while working outdoors.  His BP is WNL today.  His orthostatic vitals were negative today, but has had orthostatic hypotension in the past.  He reports that he is trying to maintain at least 64 ounces of fluid intake in a day, but still gets dehydrated due to excessive sweating while working outdoors - has [employer].  He currently takes olmesartan 5 mg QD, amlodipine  5 mg QD, Jardiance  10 mg QD and propranolol  20 mg 2 times daily.  Past Medical History:  Diagnosis Date   Abdominal hernia    2025   Acute renal injury (HCC) 06/28/2017   Anxiety    Aspiration pneumonia of right upper lobe due to gastric secretions (HCC)    Diabetes mellitus without complication (HCC)    Diverticulitis    GERD (gastroesophageal reflux disease) 01/08/2018   Hiatal hernia    2022   Hypertension    Prediabetes    Seasonal allergies    Sleep apnea    Syncope 06/26/2021    Past Surgical History:  Procedure Laterality Date   BIOPSY  03/18/2021   Procedure: BIOPSY;  Surgeon: Eartha Angelia Sieving, MD;  Location: [facility];  Service: Gastroenterology;;  esophageal at Z-line   BIOPSY  05/23/2021   Procedure: BIOPSY;  Surgeon: Eartha Angelia Sieving, MD;  Location: [facility];  Service: Gastroenterology;;   BIOPSY  08/19/2023   Procedure: BIOPSY;  Surgeon: Eartha Angelia Sieving, MD;  Location: [facility];  Service: Gastroenterology;;   CARDIOPULMONARY EXERCISE TEST (CPX)  01/09/2022   Interpretation limited by submaximal effort.  Severe functional impairment when compared to max sedentary  norms.  No cardiopulmonary limitations.  Noted tremor and generalized weakness that lead to premature exercise termination-consider neuromuscular evaluation.   COLONOSCOPY N/A 09/22/2017   Procedure: COLONOSCOPY;  Surgeon: Golda Claudis PENNER, MD;  Location: [facility];  Service: Endoscopy;  Laterality: N/A;  9:55   COLONOSCOPY WITH PROPOFOL  N/A 05/23/2021   Procedure: COLONOSCOPY WITH PROPOFOL ;  Surgeon: Eartha Angelia Sieving, MD;  Location: [facility];  Service: Gastroenterology;  Laterality: N/A;  7:30   ESOPHAGOGASTRODUODENOSCOPY (EGD) WITH PROPOFOL  N/A 03/18/2021   Procedure: ESOPHAGOGASTRODUODENOSCOPY (EGD) WITH PROPOFOL ;  Surgeon: Eartha Angelia Sieving, MD;  Location: [facility];  Service: Gastroenterology;  Laterality: N/A;   ESOPHAGOGASTRODUODENOSCOPY (EGD) WITH PROPOFOL  N/A 05/23/2021   Procedure: ESOPHAGOGASTRODUODENOSCOPY (EGD) WITH PROPOFOL ;  Surgeon: Eartha Angelia Sieving, MD;  [facility]: [facility];  Service: Gastroenterology;  Laterality: N/A;   ESOPHAGOGASTRODUODENOSCOPY (EGD) WITH PROPOFOL  N/A 08/19/2023   Procedure: ESOPHAGOGASTRODUODENOSCOPY (EGD) WITH PROPOFOL ;  Surgeon: Eartha Angelia Sieving, MD;  Location: [facility];  Service: Gastroenterology;  Laterality: N/A;  11:45AM;ASA 3   ESOPHAGOGASTRODUODENOSCOPY (EGD) WITH PROPOFOL  N/A 10/08/2023   Procedure: ESOPHAGOGASTRODUODENOSCOPY (EGD) WITH PROPOFOL ;  Surgeon: Eartha Angelia Sieving, MD;  [facility]: [facility];  Service: Gastroenterology;  Laterality: N/A;  1:45PM;ASA 1-2   GIVENS CAPSULE STUDY N/A 10/08/2023   Procedure: GIVENS CAPSULE STUDY;  Surgeon: Eartha Angelia Sieving, MD;  Location: [facility];  Service: Gastroenterology;  Laterality: N/A;  1:45PM;ASA 1-2   POLYPECTOMY  09/22/2017   Procedure: POLYPECTOMY;  Surgeon: Golda Claudis PENNER, MD;  Location: [facility];  Service: Endoscopy;;   RIGHT/LEFT HEART CATH AND CORONARY ANGIOGRAPHY N/A 12/12/2021   Procedure: RIGHT/LEFT HEART  CATH AND CORONARY ANGIOGRAPHY;  Surgeon: Cherrie Toribio SAUNDERS, MD;  Location: [facility];  Service: Cardiovascular:: Normal Coronaries & Hemodynamics.  EF 50-55%. RA = 4 mmHg, RV = 23/4; PA = 22/6 (14) & PCW = 7; Ao sat = 95%; PA sat = 76%, 76%& SVC sat = 74%Fick cardiac output/index = 7.1/3.0; PVR = 1.0 WU   TRANSTHORACIC ECHOCARDIOGRAM  02/24/2021   EF 60 to 65%.  Normal LV function.  Normal valves.  Mild RV dilation with normal function.   ZIO PATCH EVENT MONITOR  01/2022   Sinus rhythm with heart rate range 24 to 159 bpm, 1  AVB and Wenckebach block noted along with rare PACs and PVCs.  No arrhythmias.    Family History  Problem Relation Age of Onset   Healthy Mother    Cervical cancer Mother    Cancer Father    Colon cancer Maternal Grandmother    Colon cancer Maternal Grandfather     Social History   Socioeconomic History   Marital status: Single    Spouse name: Not on file   Number of children: Not on file   Years of education: Not on file   Highest education level: Not on file  Occupational History   Not on file  Tobacco Use   Smoking status: Never    Passive exposure: Past   Smokeless tobacco: Never  Vaping Use   Vaping status: Never Used  Substance and Sexual Activity   Alcohol use: Not Currently    Comment: occasionally   Drug use: No   Sexual activity: Not on file  Other Topics Concern   Not on file  Social History Narrative   He currently has his own lawn care landscaping service. He previous had 80 yards before he got sick and since decreased. He works around a Interior and spatial designer. One of the chemical names is roundup.    Social Drivers of Corporate investment banker Strain: Not on file  Food Insecurity: No Food Insecurity (06/20/2023)   Hunger Vital Sign    Worried About Running Out of Food in the Last Year: Never true    Ran Out of Food in the Last Year: Never true  Transportation Needs: No Transportation Needs (12/27/2022)   PRAPARE -  Administrator, Civil Service (Medical): No    Lack of Transportation (Non-Medical): No  Physical Activity: Insufficiently Active (03/29/2022)   Exercise Vital Sign    Days of Exercise per Week: 2 days    Minutes of Exercise per Session: 10 min  Stress: Stress Concern Present (05/07/2022)   [employer] of Occupational Health - Occupational Stress Questionnaire    Feeling of Stress : To some extent  Social Connections: Not on file  Intimate Partner Violence: Not At Risk (06/20/2023)   Humiliation, Afraid, Rape, and Kick questionnaire    Fear of Current or Ex-Partner: No    Emotionally Abused: No    Physically Abused: No    Sexually Abused: No    Outpatient Medications Prior to Visit  Medication Sig Dispense Refill   albuterol  (VENTOLIN  HFA) 108 (90 Base) MCG/ACT inhaler Inhale 1-2 puffs into the lungs every 6 (six) hours as needed for wheezing or shortness of breath.  amLODipine  (NORVASC ) 5 MG tablet Take 1 tablet by mouth once daily 90 tablet 0   b complex vitamins capsule Take 1 capsule by mouth daily. With D3 gummie     benzonatate  (TESSALON ) 100 MG capsule Take 1 capsule (100 mg total) by mouth 3 (three) times daily as needed for cough. Do not take with alcohol or while operating or driving heavy machinery 21 capsule 0   Blood Glucose Monitoring Suppl DEVI 1 each by Does not apply route in the morning, at noon, and at bedtime. May substitute to any manufacturer covered by patient's insurance. 1 each 0   budesonide -formoterol  (SYMBICORT ) 160-4.5 MCG/ACT inhaler Inhale 2 puffs into the lungs in the morning and at bedtime. 1 each 12   cetirizine (ZYRTEC) 10 MG tablet Take 10 mg by mouth daily.     Cholecalciferol (VITAMIN D -3 PO) Take by mouth daily at 6 (six) AM.     clindamycin  (CLEOCIN  T) 1 % external solution Apply topically to acne bumps at aa's qd 30 mL 3   clindamycin  (CLEOCIN -T) 1 % lotion Apply to affected areas of bumps once daily after shower. 60 mL 3    clobetasol  (TEMOVATE ) 0.05 % external solution Apply 1 application. topically 2 (two) times daily. Use as directed.  Mix with cerave cream. Use for up to 4 weeks. Avoid applying to face, groin, and axilla. Use as directed. 50 mL 1   clobetasol  cream (TEMOVATE ) 0.05 % Spot treat more severe areas once to twice daily until improved. Avoid applying to face, groin, and axilla. Use as directed. Long-term use can cause thinning of the skin. 60 g 3   Continuous Glucose Sensor (DEXCOM G7 SENSOR) MISC Use it to check blood glucose 3 times before meals, at bedtime and as needed. 3 each 5   Continuous Glucose Transmitter (DEXCOM G6 TRANSMITTER) MISC Use to check blood sugar daily DX e11.65 1 each 3   cyclobenzaprine  (FLEXERIL ) 5 MG tablet Take 1 tablet (5 mg total) by mouth 3 (three) times daily as needed for muscle spasms. 20 tablet 0   diphenhydrAMINE -zinc  acetate (BENADRYL  EXTRA STRENGTH) cream Apply 1 application topically 3 (three) times daily as needed for itching. 28.4 g 0   docusate sodium  (COLACE) 100 MG capsule Take 100 mg by mouth 2 (two) times daily.     doxycycline  (VIBRAMYCIN ) 50 MG capsule Take 3 capsules (150 mg total) by mouth daily. Take with food. 90 capsule 3   DULoxetine (CYMBALTA) 30 MG capsule Take 30 mg by mouth 2 (two) times daily.     fenofibrate  (TRICOR ) 145 MG tablet Take 1 tablet (145 mg total) by mouth daily. 90 tablet 3   fluocinonide  (LIDEX ) 0.05 % external solution Apply once or twice daily to affected areas on scalp up to 2 weeks as needed for itching. Avoid applying to face, groin, and axilla. 60 mL 2   fluticasone  (FLONASE  ALLERGY RELIEF) 50 MCG/ACT nasal spray Place 2 sprays into both nostrils daily as needed for allergies or rhinitis. 16 mL 2   fluticasone  (FLONASE ) 50 MCG/ACT nasal spray Place 1 spray into both nostrils daily. 100 mL 2   gabapentin (NEURONTIN) 300 MG capsule Take 300 mg by mouth 3 (three) times daily.     Glucose Blood (BLOOD GLUCOSE TEST STRIPS) STRP 1 each  by In Vitro route in the morning, at noon, and at bedtime. May substitute to any manufacturer covered by patient's insurance. 100 strip 1   hydrOXYzine  (ATARAX ) 25 MG tablet Take 1-2 tablets by  mouth at bedtime as needed for itch. 60 tablet 5   hydrOXYzine  (VISTARIL ) 25 MG capsule TAKE 1 TO 2 CAPSULES BY MOUTH AT BEDTIME FOR ITCHING (  CAN  CAUSE  DROWSINESS) 60 capsule 0   JARDIANCE  10 MG TABS tablet TAKE 1 TABLET BY MOUTH ONCE DAILY BEFORE BREAKFAST 60 tablet 0   ketoconazole  (NIZORAL ) 2 % shampoo Massage into scalp 2-3 times a week, let sit several minutes before rinsing. 120 mL 11   lubiprostone  (AMITIZA ) 24 MCG capsule TAKE 1 CAPSULE BY MOUTH TWICE DAILY WITH A MEAL 180 capsule 0   montelukast  (SINGULAIR ) 10 MG tablet TAKE 1 TABLET BY MOUTH AT BEDTIME 90 tablet 3   omeprazole  (PRILOSEC) 40 MG capsule Take 1 capsule by mouth twice daily 180 capsule 0   ondansetron  (ZOFRAN ) 4 MG tablet Take 1 tablet (4 mg total) by mouth every 6 (six) hours as needed for nausea. 20 tablet 0   propranolol  (INDERAL ) 20 MG tablet Take one tablet 20 mg  twice daily  may take an  additional dose of 20 mg  as needed for palpitations 190 tablet 3   rosuvastatin  (CRESTOR ) 40 MG tablet Take 1 tablet (40 mg total) by mouth daily. 90 tablet 3   Testosterone  25 MG/2.5GM (1%) GEL Place 5 g onto the skin every morning. 1 packet to each shoulder q am. 150 g 5   triamcinolone  cream (KENALOG ) 0.1 %      Vitamin D , Ergocalciferol , (DRISDOL ) 1.25 MG (50000 UNIT) CAPS capsule Take 1 capsule (50,000 Units total) by mouth every 7 (seven) days. 20 capsule 1   No facility-administered medications prior to visit.    Allergies  Allergen Reactions   Gramineae Pollens Itching   Claritin [Loratadine] Other (See Comments)    Heart race   Loratadine-Pseudoephedrine Er Palpitations   Pollen Extract Other (See Comments)    Seasonal allergies    Review of Systems  Constitutional:  Positive for fatigue. Negative for chills and fever.   HENT:  Negative for congestion and sore throat.   Eyes:  Negative for pain and discharge.  Respiratory:  Positive for shortness of breath (Intermittent). Negative for cough.   Cardiovascular:  Positive for palpitations. Negative for chest pain.  Gastrointestinal:  Positive for constipation. Negative for diarrhea, nausea and vomiting.  Endocrine: Negative for polydipsia and polyuria.  Genitourinary:  Negative for dysuria and hematuria.  Musculoskeletal:  Positive for arthralgias, back pain, myalgias and neck pain. Negative for neck stiffness.  Neurological:  Positive for tremors and light-headedness. Negative for headaches.  Psychiatric/Behavioral:  Positive for sleep disturbance. Negative for agitation and behavioral problems. The patient is nervous/anxious.        Objective:    Physical Exam Vitals reviewed.  Constitutional:      General: He is not in acute distress.    Appearance: He is not diaphoretic.  HENT:     Head: Normocephalic and atraumatic.     Nose: No congestion.     Mouth/Throat:     Mouth: Mucous membranes are moist.  Eyes:     General: No scleral icterus.    Extraocular Movements: Extraocular movements intact.  Cardiovascular:     Rate and Rhythm: Normal rate and regular rhythm.     Heart sounds: Normal heart sounds. No murmur heard. Pulmonary:     Breath sounds: Normal breath sounds. No wheezing or rales.  Musculoskeletal:        General: Tenderness (Mid thoracic and lumbar spine area) present.  Cervical back: Neck supple. No tenderness.     Right lower leg: No edema.     Left lower leg: No edema.  Skin:    General: Skin is warm.     Coloration: Skin is not jaundiced.  Neurological:     General: No focal deficit present.     Mental Status: He is alert and oriented to person, place, and time.     Sensory: No sensory deficit.     Motor: No weakness.     Comments: Functional tremors on right hand  Psychiatric:        Mood and Affect: Mood is anxious.         Behavior: Behavior normal.        Thought Content: Thought content does not include homicidal or suicidal ideation.     BP 130/80   Pulse 85   Ht 6' 3 (1.905 m)   Wt 270 lb 12.8 oz (122.8 kg)   SpO2 99%   BMI 33.85 kg/m  Wt Readings from Last 3 Encounters:  06/28/24 270 lb 12.8 oz (122.8 kg)  05/26/24 265 lb 8 oz (120.4 kg)  05/20/24 268 lb 9.6 oz (121.8 kg)        Assessment & Plan:   Problem List Items Addressed This Visit       Cardiovascular and Mediastinum   Essential hypertension (Chronic)   BP Readings from Last 1 Encounters:  06/28/24 130/80   Well-controlled with Amlodipine  5 mg once daily and Olmesartan 5 mg QD On propranolol  for PSVT and tremors DC olmesartan due to concern for orthostatic hypotension Counseled for compliance with the medications Advised DASH diet and moderate exercise/walking, at least 150 mins/week      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    Suzzane MARLA Blanch, MD

## 2024-06-28 NOTE — Assessment & Plan Note (Signed)
 Last BMP showed stable GFR - 54 Maintain adequate hydration On Jardiance , DC olmesartan due to orthostatic hypotension Check CMP Followed by nephrology

## 2024-06-28 NOTE — Patient Instructions (Addendum)
 Please stop taking Olmesartan for now.  Please continue to take other medications as prescribed.  Please continue to follow low carb diet and perform moderate exercise/walking as tolerated.  Please continue to maintain at least 64 ounces of fluid intake in a day.

## 2024-06-28 NOTE — Assessment & Plan Note (Signed)
 BP Readings from Last 1 Encounters:  06/28/24 130/80   Well-controlled with Amlodipine  5 mg once daily and Olmesartan 5 mg QD On propranolol  for PSVT and tremors DC olmesartan due to concern for orthostatic hypotension Counseled for compliance with the medications Advised DASH diet and moderate exercise/walking, at least 150 mins/week

## 2024-06-28 NOTE — Assessment & Plan Note (Signed)
 Lightheadedness, facial flushing and hot sensation appears to be presyncopal Advised again to maintain adequate hydration and eat at regular intervals Needs to avoid overexertion DC olmesartan for now

## 2024-06-29 ENCOUNTER — Other Ambulatory Visit

## 2024-07-01 ENCOUNTER — Other Ambulatory Visit: Payer: Self-pay | Admitting: Dermatology

## 2024-07-05 ENCOUNTER — Encounter (INDEPENDENT_AMBULATORY_CARE_PROVIDER_SITE_OTHER): Payer: Self-pay | Admitting: Otolaryngology

## 2024-07-05 ENCOUNTER — Ambulatory Visit (INDEPENDENT_AMBULATORY_CARE_PROVIDER_SITE_OTHER): Admitting: Otolaryngology

## 2024-07-05 VITALS — BP 118/78 | HR 84 | Ht 75.0 in | Wt 262.0 lb

## 2024-07-05 DIAGNOSIS — K148 Other diseases of tongue: Secondary | ICD-10-CM | POA: Diagnosis not present

## 2024-07-05 DIAGNOSIS — G4733 Obstructive sleep apnea (adult) (pediatric): Secondary | ICD-10-CM

## 2024-07-05 DIAGNOSIS — Z91198 Patient's noncompliance with other medical treatment and regimen for other reason: Secondary | ICD-10-CM | POA: Diagnosis not present

## 2024-07-05 DIAGNOSIS — Z789 Other specified health status: Secondary | ICD-10-CM

## 2024-07-05 NOTE — Progress Notes (Signed)
 Dear Dr. Tobie, Here is my assessment for our mutual patient, Burke Terry. Thank you for allowing me the opportunity to care for your patient. Please do not hesitate to contact me should you have any other questions. Sincerely, Dr. Eldora Tobie  Otolaryngology Clinic Note Referring provider: Dr. Tobie HPI:  Elijah Shaffer is a 43 y.o. male kindly referred by Dr. Tobie for evaluation of Obstructive Sleep Apnea  Initial visit (06/2024):  OSA diagnosed In 2023, prescribed CPAP and was initially doing well but then started to pull it off at night and felt suffocated. He has tried multiple masks but did not help.  Currently using no CPAP Insomnia: denies Chest surgery: no Denies dysphonia, dysphagia Workup so far: HST  He is interested in [employer] after seeing a commercial.  He does report that sometimes during the night, he will bite his tongue (occasionally) - unclear what is precipitating it and he is seeing Neurology for it (approximately once or twice a month) -- EEG planned  ENT Surgery: no Personal or FHx of bleeding dz or anesthesia difficulty: no  GLP-1: no AP/AC: no  Tobacco: no. Alcohol: no  PMHx: OSA, T2DM, GERD, HLD, CKD, Hypogonadism, GERD, Tremors  Independent Review of Additional Tests or Records:  Dr. Jess (05/26/2024): noted daytime sleepiness, on CPAP for OSA. Difficulty due to mask discomfort. Dx: OSA with CPAP intolerance; Rx: ref to ENT for Inspire Dr. Kip 12/24/2023: Noted tremors, concern for seizures, EMG/NCS prior concerning for elevated protein and polyneuropathy CBC 04/07/2024: WBC 12.2, Hgb 16.6, Plt 274, Eos 0 CMP 03/25/2024: BUN/Cr 20/1.25 Home Sleep Test (06/05/2024): O2 Nadir 76%, AHI 22 Home sleep test 12/12/2021: AHI 23.1, Central 2.1  PMH/Meds/All/SocHx/FamHx/ROS:   Past Medical History:  Diagnosis Date   Abdominal hernia    2025   Acute renal injury (HCC) 06/28/2017   Anxiety    Aspiration pneumonia of right upper lobe due to gastric  secretions (HCC)    Diabetes mellitus without complication (HCC)    Diverticulitis    GERD (gastroesophageal reflux disease) 01/08/2018   Hiatal hernia    2022   Hypertension    Prediabetes    Seasonal allergies    Sleep apnea    Syncope 06/26/2021     Past Surgical History:  Procedure Laterality Date   BIOPSY  03/18/2021   Procedure: BIOPSY;  Surgeon: Eartha Angelia Sieving, MD;  [facility];  Service: Gastroenterology;;  esophageal at Z-line   BIOPSY  05/23/2021   Procedure: BIOPSY;  Surgeon: Eartha Angelia Sieving, MD;  [facility]: [facility];  Service: Gastroenterology;;   BIOPSY  08/19/2023   Procedure: BIOPSY;  Surgeon: Eartha Angelia Sieving, MD;  [facility]: [facility];  Service: Gastroenterology;;   CARDIOPULMONARY EXERCISE TEST (CPX)  01/09/2022   Interpretation limited by submaximal effort.  Severe functional impairment when compared to max sedentary norms.  No cardiopulmonary limitations.  Noted tremor and generalized weakness that lead to premature exercise termination-consider neuromuscular evaluation.   COLONOSCOPY N/A 09/22/2017   Procedure: COLONOSCOPY;  Surgeon: Golda Claudis PENNER, MD;  [facility]: [facility];  Service: Endoscopy;  Laterality: N/A;  9:55   COLONOSCOPY WITH PROPOFOL  N/A 05/23/2021   Procedure: COLONOSCOPY WITH PROPOFOL ;  Surgeon: Eartha Angelia Sieving, MD;  [facility]: [facility];  Service: Gastroenterology;  Laterality: N/A;  7:30   ESOPHAGOGASTRODUODENOSCOPY (EGD) WITH PROPOFOL  N/A 03/18/2021   Procedure: ESOPHAGOGASTRODUODENOSCOPY (EGD) WITH PROPOFOL ;  Surgeon: Eartha Angelia Sieving, MD;  [facility]: [facility];  Service: Gastroenterology;  Laterality: N/A;  ESOPHAGOGASTRODUODENOSCOPY (EGD) WITH PROPOFOL  N/A 05/23/2021   Procedure: ESOPHAGOGASTRODUODENOSCOPY (EGD) WITH PROPOFOL ;  Surgeon: Eartha Angelia Sieving, MD;  [facility];  Service: Gastroenterology;  Laterality: N/A;   ESOPHAGOGASTRODUODENOSCOPY  (EGD) WITH PROPOFOL  N/A 08/19/2023   Procedure: ESOPHAGOGASTRODUODENOSCOPY (EGD) WITH PROPOFOL ;  Surgeon: Eartha Angelia Sieving, MD;  [facility];  Service: Gastroenterology;  Laterality: N/A;  11:45AM;ASA 3   ESOPHAGOGASTRODUODENOSCOPY (EGD) WITH PROPOFOL  N/A 10/08/2023   Procedure: ESOPHAGOGASTRODUODENOSCOPY (EGD) WITH PROPOFOL ;  Surgeon: Eartha Angelia Sieving, MD;  [facility];  Service: Gastroenterology;  Laterality: N/A;  1:45PM;ASA 1-2   GIVENS CAPSULE STUDY N/A 10/08/2023   Procedure: GIVENS CAPSULE STUDY;  Surgeon: Eartha Angelia Sieving, MD;  [facility]: [facility];  Service: Gastroenterology;  Laterality: N/A;  1:45PM;ASA 1-2   POLYPECTOMY  09/22/2017   Procedure: POLYPECTOMY;  Surgeon: Golda Claudis PENNER, MD;  [facility];  Service: Endoscopy;;   RIGHT/LEFT HEART CATH AND CORONARY ANGIOGRAPHY N/A 12/12/2021   Procedure: RIGHT/LEFT HEART CATH AND CORONARY ANGIOGRAPHY;  Surgeon: Cherrie Sieving SAUNDERS, MD;  [facility]: [facility];  Service: Cardiovascular:: Normal Coronaries & Hemodynamics.  EF 50-55%. RA = 4 mmHg, RV = 23/4; PA = 22/6 (14) & PCW = 7; Ao sat = 95%; PA sat = 76%, 76%& SVC sat = 74%Fick cardiac output/index = 7.1/3.0; PVR = 1.0 WU   TRANSTHORACIC ECHOCARDIOGRAM  02/24/2021   EF 60 to 65%.  Normal LV function.  Normal valves.  Mild RV dilation with normal function.   ZIO PATCH EVENT MONITOR  01/2022   Sinus rhythm with heart rate range 24 to 159 bpm, 1  AVB and Wenckebach block noted along with rare PACs and PVCs.  No arrhythmias.    Family History  Problem Relation Age of Onset   Healthy Mother    Cervical cancer Mother    Cancer Father    Colon cancer Maternal Grandmother    Colon cancer Maternal Grandfather      Social Connections: Not on file      Current Outpatient Medications:    albuterol  (VENTOLIN  HFA) 108 (90 Base) MCG/ACT inhaler, Inhale 1-2 puffs into the lungs every 6 (six) hours as needed for wheezing  or shortness of breath., Disp: , Rfl:    amLODipine  (NORVASC ) 5 MG tablet, Take 1 tablet by mouth once daily, Disp: 90 tablet, Rfl: 0   b complex vitamins capsule, Take 1 capsule by mouth daily. With D3 gummie, Disp: , Rfl:    benzonatate  (TESSALON ) 100 MG capsule, Take 1 capsule (100 mg total) by mouth 3 (three) times daily as needed for cough. Do not take with alcohol or while operating or driving heavy machinery, Disp: 21 capsule, Rfl: 0   Blood Glucose Monitoring Suppl DEVI, 1 each by Does not apply route in the morning, at noon, and at bedtime. May substitute to any manufacturer covered by patient's insurance., Disp: 1 each, Rfl: 0   budesonide -formoterol  (SYMBICORT ) 160-4.5 MCG/ACT inhaler, Inhale 2 puffs into the lungs in the morning and at bedtime., Disp: 1 each, Rfl: 12   cetirizine (ZYRTEC) 10 MG tablet, Take 10 mg by mouth daily., Disp: , Rfl:    Cholecalciferol (VITAMIN D -3 PO), Take by mouth daily at 6 (six) AM., Disp: , Rfl:    clindamycin  (CLEOCIN  T) 1 % external solution, Apply topically to acne bumps at aa's qd, Disp: 30 mL, Rfl: 3   clindamycin  (CLEOCIN  T) 1 % lotion, APPLY TO BUMPS ONCE DAILY AFTER SHOWER., Disp: 60  mL, Rfl: 3   clobetasol  (TEMOVATE ) 0.05 % external solution, Apply 1 application. topically 2 (two) times daily. Use as directed.  Mix with cerave cream. Use for up to 4 weeks. Avoid applying to face, groin, and axilla. Use as directed., Disp: 50 mL, Rfl: 1   clobetasol  cream (TEMOVATE ) 0.05 %, Spot treat more severe areas once to twice daily until improved. Avoid applying to face, groin, and axilla. Use as directed. Long-term use can cause thinning of the skin., Disp: 60 g, Rfl: 3   Continuous Glucose Sensor (DEXCOM G7 SENSOR) MISC, Use it to check blood glucose 3 times before meals, at bedtime and as needed., Disp: 3 each, Rfl: 5   Continuous Glucose Transmitter (DEXCOM G6 TRANSMITTER) MISC, Use to check blood sugar daily DX e11.65, Disp: 1 each, Rfl: 3    cyclobenzaprine  (FLEXERIL ) 5 MG tablet, Take 1 tablet (5 mg total) by mouth 3 (three) times daily as needed for muscle spasms., Disp: 20 tablet, Rfl: 0   diphenhydrAMINE -zinc  acetate (BENADRYL  EXTRA STRENGTH) cream, Apply 1 application topically 3 (three) times daily as needed for itching., Disp: 28.4 g, Rfl: 0   docusate sodium  (COLACE) 100 MG capsule, Take 100 mg by mouth 2 (two) times daily., Disp: , Rfl:    doxycycline  (VIBRAMYCIN ) 50 MG capsule, Take 3 capsules (150 mg total) by mouth daily. Take with food., Disp: 90 capsule, Rfl: 3   DULoxetine (CYMBALTA) 30 MG capsule, Take 30 mg by mouth 2 (two) times daily., Disp: , Rfl:    fenofibrate  (TRICOR ) 145 MG tablet, Take 1 tablet (145 mg total) by mouth daily., Disp: 90 tablet, Rfl: 3   fluocinonide  (LIDEX ) 0.05 % external solution, Apply once or twice daily to affected areas on scalp up to 2 weeks as needed for itching. Avoid applying to face, groin, and axilla., Disp: 60 mL, Rfl: 2   fluticasone  (FLONASE  ALLERGY RELIEF) 50 MCG/ACT nasal spray, Place 2 sprays into both nostrils daily as needed for allergies or rhinitis., Disp: 16 mL, Rfl: 2   fluticasone  (FLONASE ) 50 MCG/ACT nasal spray, Place 1 spray into both nostrils daily., Disp: 100 mL, Rfl: 2   gabapentin (NEURONTIN) 300 MG capsule, Take 300 mg by mouth 3 (three) times daily., Disp: , Rfl:    Glucose Blood (BLOOD GLUCOSE TEST STRIPS) STRP, 1 each by In Vitro route in the morning, at noon, and at bedtime. May substitute to any manufacturer covered by patient's insurance., Disp: 100 strip, Rfl: 1   hydrOXYzine  (ATARAX ) 25 MG tablet, Take 1-2 tablets by mouth at bedtime as needed for itch., Disp: 60 tablet, Rfl: 5   hydrOXYzine  (VISTARIL ) 25 MG capsule, TAKE 1 TO 2 CAPSULES BY MOUTH AT BEDTIME FOR ITCHING (  CAN  CAUSE  DROWSINESS), Disp: 60 capsule, Rfl: 0   JARDIANCE  10 MG TABS tablet, TAKE 1 TABLET BY MOUTH ONCE DAILY BEFORE BREAKFAST, Disp: 60 tablet, Rfl: 0   ketoconazole  (NIZORAL ) 2 %  shampoo, Massage into scalp 2-3 times a week, let sit several minutes before rinsing., Disp: 120 mL, Rfl: 11   lubiprostone  (AMITIZA ) 24 MCG capsule, TAKE 1 CAPSULE BY MOUTH TWICE DAILY WITH A MEAL, Disp: 180 capsule, Rfl: 0   montelukast  (SINGULAIR ) 10 MG tablet, TAKE 1 TABLET BY MOUTH AT BEDTIME, Disp: 90 tablet, Rfl: 3   omeprazole  (PRILOSEC) 40 MG capsule, Take 1 capsule by mouth twice daily, Disp: 180 capsule, Rfl: 0   ondansetron  (ZOFRAN ) 4 MG tablet, Take 1 tablet (4 mg total) by mouth every  6 (six) hours as needed for nausea., Disp: 20 tablet, Rfl: 0   propranolol  (INDERAL ) 20 MG tablet, Take one tablet 20 mg  twice daily  may take an  additional dose of 20 mg  as needed for palpitations, Disp: 190 tablet, Rfl: 3   rosuvastatin  (CRESTOR ) 40 MG tablet, Take 1 tablet (40 mg total) by mouth daily., Disp: 90 tablet, Rfl: 3   Testosterone  25 MG/2.5GM (1%) GEL, Place 5 g onto the skin every morning. 1 packet to each shoulder q am., Disp: 150 g, Rfl: 5   triamcinolone  cream (KENALOG ) 0.1 %, , Disp: , Rfl:    Vitamin D , Ergocalciferol , (DRISDOL ) 1.25 MG (50000 UNIT) CAPS capsule, Take 1 capsule (50,000 Units total) by mouth every 7 (seven) days., Disp: 20 capsule, Rfl: 1   Physical Exam:   BP 118/78 (BP Location: Left Arm, Patient Position: Sitting, Cuff Size: Large)   Pulse 84   Ht 6' 3 (1.905 m)   Wt 262 lb (118.8 kg)   SpO2 94%   BMI 32.75 kg/m   Salient findings:  CN II-XII intact Bilateral EAC clear and TM intact with well pneumatized middle ear spaces Anterior rhinoscopy: Septum intact; bilateral inferior turbinates without significant hypertrophy; modest dryness No lesions of oral cavity/oropharynx; dentition fair; tongue friedman 3; tonsils 1/1 No obviously palpable neck masses/lymphadenopathy/thyromegaly No respiratory distress or stridor No neck or chest scars  Seprately Identifiable Procedures:  Prior to initiating any procedures, risks/benefits/alternatives were explained  to the patient and verbal consent obtained. Procedure Note Pre-procedure diagnosis:  Obstructive sleep apnea, rule out structural cause Post-procedure diagnosis: Same Procedure: Transnasal Fiberoptic Laryngoscopy, CPT 31575 - Mod 25 Indication: see above Complications: None apparent EBL: 0 mL  The procedure was undertaken to further evaluate the patient's complaint above, with mirror exam inadequate for appropriate examination due to gag reflex and poor patient tolerance  Procedure:  Patient was identified as correct patient. Verbal consent was obtained. The nose was sprayed with oxymetazoline and 4% lidocaine . The The flexible laryngoscope was passed through the nose to view the nasal cavity, pharynx (oropharynx, hypopharynx) and larynx.  The larynx was examined at rest and during multiple phonatory tasks. Documentation was obtained and reviewed with patient. The scope was removed. The patient tolerated the procedure well.  Findings: The nasal cavity and nasopharynx did not reveal any masses or lesions, mucosa appeared to be without obvious lesions. The tongue base, pharyngeal walls, piriform sinuses, vallecula, epiglottis and postcricoid region are normal in appearance. The visualized portion of the subglottis and proximal trachea is widely patent. The vocal folds are mobile bilaterally. There are no lesions on the free edge of the vocal folds nor elsewhere in the larynx worrisome for malignancy.    Electronically signed by: Eldora KATHEE Blanch, MD 07/05/2024 1:54 PM   Impression & Plans:  Elijah Shaffer is a 43 y.o. male with:  1. OSA (obstructive sleep apnea)   2. Intolerance of continuous positive airway pressure (CPAP) ventilation   3. Tongue biting    We discussed options including continued CPAP v/s Inspire. Unable to tolerate CPAP due to feeling suffocated and takes it off.  We had a discussion regarding risks of Inspire including lack of benefit, persistent symptoms, pneumothorax,  tongue soreness or weakness, Floor of mouth numbness, injury to major vessels, hematoma, implant infection, bleeding, scarring, tethering of neck, persistent symptoms, need for further procedures, and risk of anesthesia among others.   We discussed options including observation and continued CPAP and DISE for  inspire candidacy.   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  See below regarding exact medications prescribed this encounter including dosages and route: No orders of the defined types were placed in this encounter.     Thank you for allowing me the opportunity to care for your patient. Please do not hesitate to contact me should you have any other questions.  Sincerely, Eldora Blanch, MD Otolaryngologist (ENT), [facility] Phone: [phone] Fax: [phone]  07/05/2024, 1:54 PM   MDM:  Level 4 - 99204 Complexity/Problems addressed: mod  Data complexity: mod - independent review of notes, labs, test - Morbidity: mod - decision for surgery  - Prescription Drug prescribed or managed: no

## 2024-07-15 NOTE — Telephone Encounter (Signed)
 Reddy, Pallavi D, MD to [facility]      06/21/24  2:55 PM Please advise patient to follow up with neurology regarding current symptoms. This is out of my scope of practice. Thanks

## 2024-07-15 NOTE — Telephone Encounter (Signed)
 Robinson, Zea J, CMA to Reddy, Pallavi D, MD      06/18/24 12:08 PM Spoke with pt, he is currently being evaluated by ENT for [facility] device implant due to not being able to tolerate CPAP because of his tongue biting and has chewed through mask in his sleep.    Has noted arm tremors and still tongue biting in his sleep and has spoken with his Neurologist about these symptoms, and they may be sending him to do a certain sleep study at the hospital but the patient is getting frustrated because he doesn't seem to be making any progress on why he's biting his tongue or what he can do in the meantime to prevent it from happening, and he's afraid of biting his tongue off.    I told patient that we would like to kept up to date on his Inspire and Neurology results as much as possible so we can have all the information on hand and that I can't explain the difference between the test his Neurologist sending for him to get done versus the one we had originally ordered.   06/18/24 11:49 AM Veverly Desiderio BRAVO contacted Bertie Benton JONETTA Lang Exie JINNY, CMA to Desiderio BRAVO Veverly

## 2024-07-22 ENCOUNTER — Ambulatory Visit: Payer: Self-pay | Admitting: Internal Medicine

## 2024-07-22 ENCOUNTER — Other Ambulatory Visit: Payer: Self-pay | Admitting: Internal Medicine

## 2024-07-22 NOTE — Telephone Encounter (Signed)
 FYI Only or Action Required?: Action required by provider: medication refill request.  Patient was last seen in primary care on 06/28/2024 by Elijah Suzzane POUR, MD.  Called Nurse Triage reporting Medication Refill.  Symptoms began Denies symptoms currently but states they occur periodically due to his job. .  Triage Disposition: Call PCP When Office is Open  Patient/caregiver understands and will follow disposition?: Yes    Reason for Disposition  [1] Prescription refill request for NON-ESSENTIAL medicine (i.e., no harm to patient if med not taken) AND [2] triager unable to refill per department policy  Answer Assessment - Initial Assessment Questions 1. DRUG NAME: What medicine do you need to have refilled?     Cyclobenzaprine  Flexeril  5mg  2. PRESCRIBER: Who prescribed it? Note: The prescribing doctor or group is responsible for refill approvals..     NP at a UC  3. PHARMACY: Have you contacted your pharmacy (drugstore)? Note: Some pharmacies will contact the doctor (or NP/PA).      Yes 4. SYMPTOMS: Do you have any symptoms?     Denies  Protocols used: Medication Refill and Renewal Call-A-AH

## 2024-07-22 NOTE — Telephone Encounter (Signed)
 This RN made first attempt to contact patient with no answer. A voicemail was left with call back number provided.    Copied from CRM [id number]. Topic: Clinical - Medication Question >> Jul 22, 2024  4:39 PM Debby BROCKS wrote: Reason for CRM: Patient received muscle spasm medication from an urgent care a while back ago and wants his PCP to refill it for him. Patient does not know the name of the medication

## 2024-07-23 MED ORDER — CYCLOBENZAPRINE HCL 5 MG PO TABS: 5.0000 mg | ORAL_TABLET | Freq: Three times a day (TID) | ORAL | 0 refills | Status: DC | PRN

## 2024-07-27 ENCOUNTER — Ambulatory Visit

## 2024-07-27 DIAGNOSIS — M722 Plantar fascial fibromatosis: Secondary | ICD-10-CM

## 2024-07-27 NOTE — Progress Notes (Signed)
 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

## 2024-07-30 ENCOUNTER — Other Ambulatory Visit: Payer: Self-pay | Admitting: Pulmonary Disease

## 2024-07-30 ENCOUNTER — Other Ambulatory Visit: Payer: Self-pay | Admitting: Internal Medicine

## 2024-07-30 DIAGNOSIS — E1122 Type 2 diabetes mellitus with diabetic chronic kidney disease: Secondary | ICD-10-CM

## 2024-07-30 DIAGNOSIS — J4530 Mild persistent asthma, uncomplicated: Secondary | ICD-10-CM

## 2024-08-10 ENCOUNTER — Encounter: Payer: Self-pay | Admitting: Internal Medicine

## 2024-08-13 ENCOUNTER — Other Ambulatory Visit: Payer: Self-pay | Admitting: Family Medicine

## 2024-08-13 DIAGNOSIS — E559 Vitamin D deficiency, unspecified: Secondary | ICD-10-CM

## 2024-08-17 ENCOUNTER — Ambulatory Visit (INDEPENDENT_AMBULATORY_CARE_PROVIDER_SITE_OTHER): Admitting: Internal Medicine

## 2024-08-17 ENCOUNTER — Encounter: Payer: Self-pay | Admitting: Internal Medicine

## 2024-08-17 VITALS — BP 134/85 | HR 106 | Ht 75.0 in | Wt 270.2 lb

## 2024-08-17 DIAGNOSIS — F419 Anxiety disorder, unspecified: Secondary | ICD-10-CM

## 2024-08-17 DIAGNOSIS — G4733 Obstructive sleep apnea (adult) (pediatric): Secondary | ICD-10-CM

## 2024-08-17 DIAGNOSIS — E291 Testicular hypofunction: Secondary | ICD-10-CM

## 2024-08-17 DIAGNOSIS — E1122 Type 2 diabetes mellitus with diabetic chronic kidney disease: Secondary | ICD-10-CM

## 2024-08-17 DIAGNOSIS — I10 Essential (primary) hypertension: Secondary | ICD-10-CM

## 2024-08-17 DIAGNOSIS — E785 Hyperlipidemia, unspecified: Secondary | ICD-10-CM

## 2024-08-17 DIAGNOSIS — Z7984 Long term (current) use of oral hypoglycemic drugs: Secondary | ICD-10-CM

## 2024-08-17 DIAGNOSIS — F339 Major depressive disorder, recurrent, unspecified: Secondary | ICD-10-CM

## 2024-08-17 DIAGNOSIS — Z23 Encounter for immunization: Secondary | ICD-10-CM

## 2024-08-17 DIAGNOSIS — N1831 Chronic kidney disease, stage 3a: Secondary | ICD-10-CM

## 2024-08-17 DIAGNOSIS — E1169 Type 2 diabetes mellitus with other specified complication: Secondary | ICD-10-CM

## 2024-08-17 MED ORDER — AMLODIPINE BESYLATE 5 MG PO TABS: 5.0000 mg | ORAL_TABLET | Freq: Every day | ORAL | 3 refills | Status: DC

## 2024-08-17 MED ORDER — TIRZEPATIDE 2.5 MG/0.5ML ~~LOC~~ SOAJ: 2.5000 mg | SUBCUTANEOUS | 0 refills | Status: DC

## 2024-08-17 NOTE — Assessment & Plan Note (Signed)
 Was on Prozac, but was discontinued due to excessive sweating Overall well controlled with Cymbalta 30 mg twice daily currently, prescribed by neurology for neuropathy

## 2024-08-17 NOTE — Assessment & Plan Note (Signed)
 BP Readings from Last 1 Encounters:  08/17/24 134/85   Well-controlled with Amlodipine  5 mg once daily On propranolol  for PSVT and tremors DCed olmesartan due to concern for orthostatic hypotension in the last visit Counseled for compliance with the medications Advised DASH diet and moderate exercise/walking, at least 150 mins/week

## 2024-08-17 NOTE — Patient Instructions (Signed)
 Please start taking Mounjaro as prescribed once weekly.  Please maintain small, frequent meals and eat at regular intervals.  Please continue to take other medications as prescribed.  Please continue to follow low carb diet and perform moderate exercise/walking as tolerated.

## 2024-08-17 NOTE — Progress Notes (Unsigned)
 Established Patient Office Visit  Subjective:  Patient ID: Elijah Shaffer, male    DOB: 03/09/1981  Age: 43 y.o. MRN: [id number]  CC:  Chief Complaint  Patient presents with   Medical Management of Chronic Issues    3 month f/u     HPI Elijah Shaffer is a 43 y.o. male with past medical history of HTN, type 2 DM, GERD, eczema, anxiety and chronic dyspnea who presents for f/u of his chronic medical conditions.  His blood pressure is wnl today.  He takes amlodipine  5 mg once daily and olmesartan 5 mg QD regularly.  He has chronic palpitations and dyspnea on exertion.  Denies any chest pain currently.  He takes propranolol  for palpitations.  Has had cardiac and pulmonology workup, which have been overall benign. He reports occasional facial flushing and hot sensation, especially after working for long hours.   Type II DM: His HbA1c was 6.5 in 03/25.  He takes Jardiance  10 mg QD.  Denies any polyuria or polydipsia currently.  He reports that he likes doughnuts, but has cut down them and soft drinks. He has Dexcom. He had 2 episodes of hypoglycemia (<50) before the previous visit, but did not confirm with fingerprick. Denies over sweating or dizziness during those reported hypoglycemia spells.  IBS: Denies any abdominal pain currently.  He usually has chronic constipation and takes Amitiza  for it.  Denies any melena or hematochezia currently. He had EGD for concern for melena in 11/24, but showed no signs of active bleeding.   Anxiety: He takes Cymbalta currently.  His Prozac  was discontinued due to excessive sweating concern, his sweating has improved now.  He still has episodes of anxiety and tremors.  He was placed on clonazepam by Dr. Milton, but was discontinued by [facility].  He has multiple physical complaints such as constant itching, dyspnea, tremors, etc., likely somewhat attributed to anxiety.  He has hydroxyzine  as needed for itching or anxiety.   He had urology  evaluation for low testosterone . He is on testosterone  supplement through gel, followed by Urology.  Denies any dysuria, hematuria or urethral discharge currently.  He has chronic fatigue, but denies any fever, chills, hemoptysis, recent weight loss or LAD.    Past Medical History:  Diagnosis Date   Abdominal hernia    2025   Acute renal injury 06/28/2017   Anxiety    Aspiration pneumonia of right upper lobe due to gastric secretions (HCC)    Diabetes mellitus without complication (HCC)    Diverticulitis    GERD (gastroesophageal reflux disease) 01/08/2018   Hiatal hernia    2022   Hypertension    Prediabetes    Seasonal allergies    Sleep apnea    Syncope 06/26/2021    Past Surgical History:  Procedure Laterality Date   BIOPSY  03/18/2021   Procedure: BIOPSY;  Surgeon: Eartha Angelia Sieving, MD;  [facility]: [facility];  Service: Gastroenterology;;  esophageal at Z-line   BIOPSY  05/23/2021   Procedure: BIOPSY;  Surgeon: Eartha Angelia Sieving, MD;  [facility];  Service: Gastroenterology;;   BIOPSY  08/19/2023   Procedure: BIOPSY;  Surgeon: Eartha Angelia Sieving, MD;  [facility];  Service: Gastroenterology;;   CARDIOPULMONARY EXERCISE TEST (CPX)  01/09/2022   Interpretation limited by submaximal effort.  Severe functional impairment when compared to max sedentary norms.  No cardiopulmonary limitations.  Noted tremor and generalized weakness that lead to premature exercise termination-consider neuromuscular evaluation.  COLONOSCOPY N/A 09/22/2017   Procedure: COLONOSCOPY;  Surgeon: Golda Claudis PENNER, MD;  [facility];  Service: Endoscopy;  Laterality: N/A;  9:55   COLONOSCOPY WITH PROPOFOL  N/A 05/23/2021   Procedure: COLONOSCOPY WITH PROPOFOL ;  Surgeon: Eartha Angelia Sieving, MD;  [facility];  Service: Gastroenterology;  Laterality: N/A;  7:30   ESOPHAGOGASTRODUODENOSCOPY (EGD) WITH PROPOFOL  N/A 03/18/2021    Procedure: ESOPHAGOGASTRODUODENOSCOPY (EGD) WITH PROPOFOL ;  Surgeon: Eartha Angelia Sieving, MD;  [facility];  Service: Gastroenterology;  Laterality: N/A;   ESOPHAGOGASTRODUODENOSCOPY (EGD) WITH PROPOFOL  N/A 05/23/2021   Procedure: ESOPHAGOGASTRODUODENOSCOPY (EGD) WITH PROPOFOL ;  Surgeon: Eartha Angelia Sieving, MD;  [facility];  Service: Gastroenterology;  Laterality: N/A;   ESOPHAGOGASTRODUODENOSCOPY (EGD) WITH PROPOFOL  N/A 08/19/2023   Procedure: ESOPHAGOGASTRODUODENOSCOPY (EGD) WITH PROPOFOL ;  Surgeon: Eartha Angelia Sieving, MD;  [facility];  Service: Gastroenterology;  Laterality: N/A;  11:45AM;ASA 3   ESOPHAGOGASTRODUODENOSCOPY (EGD) WITH PROPOFOL  N/A 10/08/2023   Procedure: ESOPHAGOGASTRODUODENOSCOPY (EGD) WITH PROPOFOL ;  Surgeon: Eartha Angelia Sieving, MD;  [facility];  Service: Gastroenterology;  Laterality: N/A;  1:45PM;ASA 1-2   GIVENS CAPSULE STUDY N/A 10/08/2023   Procedure: GIVENS CAPSULE STUDY;  Surgeon: Eartha Angelia Sieving, MD;  [facility];  Service: Gastroenterology;  Laterality: N/A;  1:45PM;ASA 1-2   POLYPECTOMY  09/22/2017   Procedure: POLYPECTOMY;  Surgeon: Golda Claudis PENNER, MD;  [facility];  Service: Endoscopy;;   RIGHT/LEFT HEART CATH AND CORONARY ANGIOGRAPHY N/A 12/12/2021   Procedure: RIGHT/LEFT HEART CATH AND CORONARY ANGIOGRAPHY;  Surgeon: Cherrie Sieving SAUNDERS, MD;  [facility]: [facility];  Service: Cardiovascular:: Normal Coronaries & Hemodynamics.  EF 50-55%. RA = 4 mmHg, RV = 23/4; PA = 22/6 (14) & PCW = 7; Ao sat = 95%; PA sat = 76%, 76%& SVC sat = 74%Fick cardiac output/index = 7.1/3.0; PVR = 1.0 WU   TRANSTHORACIC ECHOCARDIOGRAM  02/24/2021   EF 60 to 65%.  Normal LV function.  Normal valves.  Mild RV dilation with normal function.   ZIO PATCH EVENT MONITOR  01/2022   Sinus rhythm with heart rate range 24 to 159 bpm, 1  AVB and Wenckebach block noted along with rare  PACs and PVCs.  No arrhythmias.    Family History  Problem Relation Age of Onset   Healthy Mother    Cervical cancer Mother    Cancer Father    Colon cancer Maternal Grandmother    Colon cancer Maternal Grandfather     Social History   Socioeconomic History   Marital status: Single    Spouse name: Not on file   Number of children: Not on file   Years of education: Not on file   Highest education level: Not on file  Occupational History   Not on file  Tobacco Use   Smoking status: Never    Passive exposure: Past   Smokeless tobacco: Never  Vaping Use   Vaping status: Never Used  Substance and Sexual Activity   Alcohol use: Not Currently    Comment: occasionally   Drug use: No   Sexual activity: Not on file  Other Topics Concern   Not on file  Social History Narrative   He currently has his own lawn care landscaping service. He previous had 80 yards before he got sick and since decreased. He works around a Interior and spatial designer. One of the chemical names is roundup.    Social Drivers of Dispensing optician  Resource Strain: Not on file  Food Insecurity: No Food Insecurity (06/20/2023)   Hunger Vital Sign    Worried About Running Out of Food in the Last Year: Never true    Ran Out of Food in the Last Year: Never true  Transportation Needs: No Transportation Needs (12/27/2022)   PRAPARE - Administrator, Civil Service (Medical): No    Lack of Transportation (Non-Medical): No  Physical Activity: Insufficiently Active (03/29/2022)   Exercise Vital Sign    Days of Exercise per Week: 2 days    Minutes of Exercise per Session: 10 min  Stress: Stress Concern Present (05/07/2022)   [employer] of Occupational Health - Occupational Stress Questionnaire    Feeling of Stress : To some extent  Social Connections: Not on file  Intimate Partner Violence: Not At Risk (06/20/2023)   Humiliation, Afraid, Rape, and Kick questionnaire    Fear of Current or Ex-Partner: No     Emotionally Abused: No    Physically Abused: No    Sexually Abused: No    Outpatient Medications Prior to Visit  Medication Sig Dispense Refill   albuterol  (VENTOLIN  HFA) 108 (90 Base) MCG/ACT inhaler Inhale 1-2 puffs into the lungs every 6 (six) hours as needed for wheezing or shortness of breath.     amLODipine  (NORVASC ) 5 MG tablet Take 1 tablet by mouth once daily 90 tablet 0   b complex vitamins capsule Take 1 capsule by mouth daily. With D3 gummie     benzonatate  (TESSALON ) 100 MG capsule Take 1 capsule (100 mg total) by mouth 3 (three) times daily as needed for cough. Do not take with alcohol or while operating or driving heavy machinery 21 capsule 0   Blood Glucose Monitoring Suppl DEVI 1 each by Does not apply route in the morning, at noon, and at bedtime. May substitute to any manufacturer covered by patient's insurance. 1 each 0   budesonide -formoterol  (SYMBICORT ) 160-4.5 MCG/ACT inhaler Inhale 2 puffs into the lungs in the morning and at bedtime. 1 each 12   cetirizine (ZYRTEC) 10 MG tablet Take 10 mg by mouth daily.     Cholecalciferol (VITAMIN D -3 PO) Take by mouth daily at 6 (six) AM.     clindamycin  (CLEOCIN  T) 1 % external solution Apply topically to acne bumps at aa's qd 30 mL 3   clindamycin  (CLEOCIN  T) 1 % lotion APPLY TO BUMPS ONCE DAILY AFTER SHOWER. 60 mL 3   clobetasol  (TEMOVATE ) 0.05 % external solution Apply 1 application. topically 2 (two) times daily. Use as directed.  Mix with cerave cream. Use for up to 4 weeks. Avoid applying to face, groin, and axilla. Use as directed. 50 mL 1   clobetasol  cream (TEMOVATE ) 0.05 % Spot treat more severe areas once to twice daily until improved. Avoid applying to face, groin, and axilla. Use as directed. Long-term use can cause thinning of the skin. 60 g 3   Continuous Glucose Sensor (DEXCOM G7 SENSOR) MISC Use it to check blood glucose 3 times before meals, at bedtime and as needed. 3 each 5   Continuous Glucose Transmitter (DEXCOM  G6 TRANSMITTER) MISC Use to check blood sugar daily DX e11.65 1 each 3   cyclobenzaprine  (FLEXERIL ) 5 MG tablet Take 1 tablet (5 mg total) by mouth 3 (three) times daily as needed for muscle spasms. 20 tablet 0   diphenhydrAMINE -zinc  acetate (BENADRYL  EXTRA STRENGTH) cream Apply 1 application topically 3 (three) times daily as needed for itching. 28.4 g  0   docusate sodium  (COLACE) 100 MG capsule Take 100 mg by mouth 2 (two) times daily.     doxycycline  (VIBRAMYCIN ) 50 MG capsule Take 3 capsules (150 mg total) by mouth daily. Take with food. 90 capsule 3   DULoxetine (CYMBALTA) 30 MG capsule Take 30 mg by mouth 2 (two) times daily.     fenofibrate  (TRICOR ) 145 MG tablet Take 1 tablet (145 mg total) by mouth daily. 90 tablet 3   fluocinonide  (LIDEX ) 0.05 % external solution Apply once or twice daily to affected areas on scalp up to 2 weeks as needed for itching. Avoid applying to face, groin, and axilla. 60 mL 2   fluticasone  (FLONASE  ALLERGY RELIEF) 50 MCG/ACT nasal spray Place 2 sprays into both nostrils daily as needed for allergies or rhinitis. 16 mL 2   fluticasone  (FLONASE ) 50 MCG/ACT nasal spray Place 1 spray into both nostrils daily. 100 mL 2   gabapentin (NEURONTIN) 300 MG capsule Take 300 mg by mouth 3 (three) times daily.     Glucose Blood (BLOOD GLUCOSE TEST STRIPS) STRP 1 each by In Vitro route in the morning, at noon, and at bedtime. May substitute to any manufacturer covered by patient's insurance. 100 strip 1   hydrOXYzine  (ATARAX ) 25 MG tablet Take 1-2 tablets by mouth at bedtime as needed for itch. 60 tablet 5   hydrOXYzine  (VISTARIL ) 25 MG capsule TAKE 1 TO 2 CAPSULES BY MOUTH AT BEDTIME FOR ITCHING (  CAN  CAUSE  DROWSINESS) 60 capsule 0   JARDIANCE  10 MG TABS tablet TAKE 1 TABLET BY MOUTH ONCE DAILY BEFORE BREAKFAST 60 tablet 0   ketoconazole  (NIZORAL ) 2 % shampoo Massage into scalp 2-3 times a week, let sit several minutes before rinsing. 120 mL 11   lubiprostone  (AMITIZA ) 24 MCG  capsule TAKE 1 CAPSULE BY MOUTH TWICE DAILY WITH A MEAL 180 capsule 0   montelukast  (SINGULAIR ) 10 MG tablet TAKE 1 TABLET BY MOUTH AT BEDTIME 90 tablet 0   omeprazole  (PRILOSEC) 40 MG capsule Take 1 capsule by mouth twice daily 180 capsule 0   ondansetron  (ZOFRAN ) 4 MG tablet Take 1 tablet (4 mg total) by mouth every 6 (six) hours as needed for nausea. 20 tablet 0   propranolol  (INDERAL ) 20 MG tablet Take one tablet 20 mg  twice daily  may take an  additional dose of 20 mg  as needed for palpitations 190 tablet 3   rosuvastatin  (CRESTOR ) 40 MG tablet Take 1 tablet (40 mg total) by mouth daily. 90 tablet 3   Testosterone  25 MG/2.5GM (1%) GEL Place 5 g onto the skin every morning. 1 packet to each shoulder q am. 150 g 5   triamcinolone  cream (KENALOG ) 0.1 %      Vitamin D , Ergocalciferol , (DRISDOL ) 1.25 MG (50000 UNIT) CAPS capsule Take 1 capsule by mouth once a week 12 capsule 1   No facility-administered medications prior to visit.    Allergies  Allergen Reactions   Gramineae Pollens Itching   Claritin [Loratadine] Other (See Comments)    Heart race   Loratadine-Pseudoephedrine Er Palpitations   Pollen Extract Other (See Comments)    Seasonal allergies    ROS Review of Systems  Constitutional:  Positive for fatigue. Negative for chills and fever.  HENT:  Negative for congestion and sore throat.   Eyes:  Negative for pain and discharge.  Respiratory:  Positive for shortness of breath (Intermittent). Negative for cough.   Cardiovascular:  Positive for palpitations. Negative for  chest pain.  Gastrointestinal:  Positive for constipation. Negative for diarrhea, nausea and vomiting.  Endocrine: Negative for polydipsia and polyuria.  Genitourinary:  Negative for dysuria and hematuria.  Musculoskeletal:  Positive for arthralgias, back pain, myalgias and neck pain. Negative for neck stiffness.  Neurological:  Positive for tremors. Negative for headaches.  Psychiatric/Behavioral:  Positive  for sleep disturbance. Negative for agitation and behavioral problems. The patient is nervous/anxious.       Objective:    Physical Exam Vitals reviewed.  Constitutional:      General: He is not in acute distress.    Appearance: He is not diaphoretic.  HENT:     Head: Normocephalic and atraumatic.     Nose: No congestion.     Mouth/Throat:     Mouth: Mucous membranes are moist.  Eyes:     General: No scleral icterus.    Extraocular Movements: Extraocular movements intact.  Cardiovascular:     Rate and Rhythm: Normal rate and regular rhythm.     Heart sounds: Normal heart sounds. No murmur heard. Pulmonary:     Breath sounds: Normal breath sounds. No wheezing or rales.  Abdominal:     General: Bowel sounds are normal.     Palpations: Abdomen is soft.     Tenderness: There is no abdominal tenderness.  Musculoskeletal:        General: Tenderness (Mid thoracic and lumbar spine area) present.     Cervical back: Neck supple. No tenderness.     Right lower leg: No edema.     Left lower leg: No edema.  Skin:    General: Skin is warm.     Coloration: Skin is not jaundiced.  Neurological:     General: No focal deficit present.     Mental Status: He is alert and oriented to person, place, and time.     Sensory: No sensory deficit.     Motor: No weakness.     Comments: Functional tremors on right hand  Psychiatric:        Mood and Affect: Mood is anxious.        Behavior: Behavior normal.        Thought Content: Thought content does not include homicidal or suicidal ideation.     BP 134/85   Pulse (!) 106   Ht 6' 3 (1.905 m)   Wt 270 lb 3.2 oz (122.6 kg)   SpO2 96%   BMI 33.77 kg/m  Wt Readings from Last 3 Encounters:  08/17/24 270 lb 3.2 oz (122.6 kg)  07/05/24 262 lb (118.8 kg)  06/28/24 270 lb 12.8 oz (122.8 kg)    Lab Results  Component Value Date   TSH 1.280 04/07/2024   Lab Results  Component Value Date   WBC 12.2 (H) 04/07/2024   HGB 15.8 06/10/2024    HCT 48.4 06/10/2024   MCV 88 04/07/2024   PLT 274 04/07/2024   Lab Results  Component Value Date   NA 139 04/07/2024   K 4.1 04/07/2024   CO2 17 (L) 04/07/2024   GLUCOSE 125 (H) 04/07/2024   BUN 17 04/07/2024   CREATININE 1.23 04/07/2024   BILITOT 0.6 04/07/2024   ALKPHOS 100 04/07/2024   AST 20 04/07/2024   ALT 29 04/07/2024   PROT 7.1 04/07/2024   ALBUMIN 4.8 04/07/2024   CALCIUM  9.9 04/07/2024   ANIONGAP 8 03/25/2024   EGFR 75 04/07/2024   GFR 68.05 10/02/2021   Lab Results  Component Value Date   CHOL  108 01/29/2024   Lab Results  Component Value Date   HDL 34 (L) 01/29/2024   Lab Results  Component Value Date   LDLCALC 46 01/29/2024   Lab Results  Component Value Date   TRIG 168 (H) 01/29/2024   Lab Results  Component Value Date   CHOLHDL 3.2 01/29/2024   Lab Results  Component Value Date   HGBA1C 6.3 (H) 04/07/2024      Assessment & Plan:   Problem List Items Addressed This Visit   None     No orders of the defined types were placed in this encounter.   Follow-up: No follow-ups on file.    Suzzane MARLA Blanch, MD

## 2024-08-17 NOTE — Assessment & Plan Note (Signed)
 Lab Results  Component Value Date   HGBA1C 6.3 (H) 04/07/2024   Overall well-controlled On Jardiance  10 mg QD Has CGM - has history of hypoglycemia in 50s, blood glucose in range - 85% Advised to follow diabetic diet F/u CMP, HbA1c and lipid panel Diabetic eye exam: Advised to follow up with Ophthalmology for diabetic eye exam  Has CKD stage 3a, followed by Nephrology, needs to improve hydration

## 2024-08-18 ENCOUNTER — Encounter: Payer: Self-pay | Admitting: Oncology

## 2024-08-18 ENCOUNTER — Other Ambulatory Visit (HOSPITAL_COMMUNITY): Payer: Self-pay

## 2024-08-18 ENCOUNTER — Telehealth: Payer: Self-pay

## 2024-08-18 ENCOUNTER — Telehealth: Payer: Self-pay | Admitting: Pharmacy Technician

## 2024-08-18 ENCOUNTER — Ambulatory Visit: Payer: Self-pay | Admitting: Internal Medicine

## 2024-08-18 DIAGNOSIS — G629 Polyneuropathy, unspecified: Secondary | ICD-10-CM | POA: Diagnosis not present

## 2024-08-18 DIAGNOSIS — R569 Unspecified convulsions: Secondary | ICD-10-CM | POA: Diagnosis not present

## 2024-08-18 LAB — CMP14+EGFR
ALT: 42 IU/L (ref 0–44)
AST: 35 IU/L (ref 0–40)
Albumin: 4.8 g/dL (ref 4.1–5.1)
Alkaline Phosphatase: 95 IU/L (ref 47–123)
BUN/Creatinine Ratio: 9 (ref 9–20)
BUN: 14 mg/dL (ref 6–24)
Bilirubin Total: 0.4 mg/dL (ref 0.0–1.2)
CO2: 23 mmol/L (ref 20–29)
Calcium: 10.6 mg/dL — ABNORMAL HIGH (ref 8.7–10.2)
Chloride: 104 mmol/L (ref 96–106)
Creatinine, Ser: 1.53 mg/dL — ABNORMAL HIGH (ref 0.76–1.27)
Globulin, Total: 2.4 g/dL (ref 1.5–4.5)
Glucose: 81 mg/dL (ref 70–99)
Potassium: 4.2 mmol/L (ref 3.5–5.2)
Sodium: 144 mmol/L (ref 134–144)
Total Protein: 7.2 g/dL (ref 6.0–8.5)
eGFR: 57 mL/min/1.73 — ABNORMAL LOW (ref >59)

## 2024-08-18 LAB — CBC WITH DIFFERENTIAL/PLATELET
Basophils Absolute: 0.1 x10E3/uL (ref 0.0–0.2)
Basos: 1 %
EOS (ABSOLUTE): 0.1 x10E3/uL (ref 0.0–0.4)
Eos: 1 %
Hematocrit: 51.2 % — ABNORMAL HIGH (ref 37.5–51.0)
Hemoglobin: 17.2 g/dL (ref 13.0–17.7)
Immature Grans (Abs): 0.1 x10E3/uL (ref 0.0–0.1)
Immature Granulocytes: 1 %
Lymphocytes Absolute: 3.6 x10E3/uL — ABNORMAL HIGH (ref 0.7–3.1)
Lymphs: 38 %
MCH: 29.4 pg (ref 26.6–33.0)
MCHC: 33.6 g/dL (ref 31.5–35.7)
MCV: 87 fL (ref 79–97)
Monocytes Absolute: 0.9 x10E3/uL (ref 0.1–0.9)
Monocytes: 9 %
Neutrophils Absolute: 4.7 x10E3/uL (ref 1.4–7.0)
Neutrophils: 50 %
Platelets: 257 x10E3/uL (ref 150–450)
RBC: 5.86 x10E6/uL — ABNORMAL HIGH (ref 4.14–5.80)
RDW: 12.9 % (ref 11.6–15.4)
WBC: 9.4 x10E3/uL (ref 3.4–10.8)

## 2024-08-18 LAB — HEMOGLOBIN A1C
Est. average glucose Bld gHb Est-mCnc: 148 mg/dL
Hgb A1c MFr Bld: 6.8 % — ABNORMAL HIGH (ref 4.8–5.6)

## 2024-08-18 MED ORDER — TRULICITY 0.75 MG/0.5ML ~~LOC~~ SOAJ: 0.7500 mg | SUBCUTANEOUS | 0 refills | Status: DC

## 2024-08-18 NOTE — Telephone Encounter (Signed)
 Pharmacy Patient Advocate Encounter   Received notification from Onbase that prior authorization for Mounjaro 2.5mg /0.84ml auto-injectors is required/requested.   Insurance verification completed.   The patient is insured through [employer].   Per test claim: PA required; PA submitted to above mentioned insurance via Latent Key/confirmation #/EOC [id number] Status is pending

## 2024-08-18 NOTE — Telephone Encounter (Signed)
 Copied from CRM #[id number]. Topic: Clinical - Lab/Test Results >> Aug 18, 2024  9:58 AM Willma R wrote: Reason for CRM: Relayed lab results. No additional Questions.

## 2024-08-18 NOTE — Telephone Encounter (Signed)
 Pharmacy Patient Advocate Encounter  Received notification from [facility] that Prior Authorization for Mounjaro 2.5mg /0.68ml auto-injectors has been DENIED.  Full denial letter will be uploaded to the media tab. See denial reason below.   PA #/Case ID/Reference #: [id number]

## 2024-08-19 ENCOUNTER — Other Ambulatory Visit (HOSPITAL_COMMUNITY): Payer: Self-pay

## 2024-08-19 ENCOUNTER — Telehealth: Payer: Self-pay | Admitting: Pharmacy Technician

## 2024-08-19 NOTE — Telephone Encounter (Signed)
 Pharmacy Patient Advocate Encounter  Received notification from [facility] that Prior Authorization for Trulicity 0.75MG /0.5ML auto-injectors has been APPROVED from 08/19/2024 to 08/19/2025. Ran test claim, Copay is $15.00. This test claim was processed through [facility]- copay amounts may vary at other pharmacies due to pharmacy/plan contracts, or as the patient moves through the different stages of their insurance plan.   PA #/Case ID/Reference #: [id number]

## 2024-08-19 NOTE — Telephone Encounter (Signed)
 Pharmacy Patient Advocate Encounter   Received notification from CoverMyMeds that prior authorization for Trulicity 0.75MG /0.5ML auto-injectors is required/requested.   Insurance verification completed.   The patient is insured through [facility].   Per test claim: PA required; PA submitted to above mentioned insurance via Latent Key/confirmation #/EOC [id number] Status is pending

## 2024-08-20 NOTE — Assessment & Plan Note (Addendum)
 On Crestor  and Fenofibrate  Checked lipid profile

## 2024-08-20 NOTE — Assessment & Plan Note (Signed)
 Last BMP showed stable GFR - 57 Maintain adequate hydration On Jardiance , had to DC olmesartan due to orthostatic hypotension Check CMP Followed by nephrology

## 2024-08-20 NOTE — Assessment & Plan Note (Signed)
 Testosterone level was WNL recently Has testosterone gel now Followed by urology

## 2024-08-20 NOTE — Assessment & Plan Note (Signed)
 Overall well-controlled now On Cymbalta for neuropathy Was on Prozac  20 mg once daily, but had to discontinue due to excessive sweating along with Cymbalta Hydroxyzine  as needed for anxiety

## 2024-08-20 NOTE — Assessment & Plan Note (Addendum)
 Has anxiety while using it, needs evaluation for pressure setting and different mask Can be considered for Inspire device - will defer to sleep clinic Followed by pulmonology He is an excellent candidate for GLP-1 agonist therapy, mainly Zepbound - but insurance coverage/cost is a concern, Mounjaro was not covered for type II DM

## 2024-08-25 ENCOUNTER — Encounter (INDEPENDENT_AMBULATORY_CARE_PROVIDER_SITE_OTHER): Payer: Self-pay | Admitting: Gastroenterology

## 2024-08-26 ENCOUNTER — Other Ambulatory Visit (INDEPENDENT_AMBULATORY_CARE_PROVIDER_SITE_OTHER): Payer: Self-pay | Admitting: Gastroenterology

## 2024-08-26 DIAGNOSIS — K219 Gastro-esophageal reflux disease without esophagitis: Secondary | ICD-10-CM

## 2024-08-27 ENCOUNTER — Other Ambulatory Visit: Payer: Self-pay | Admitting: Dermatology

## 2024-08-30 ENCOUNTER — Institutional Professional Consult (permissible substitution) (INDEPENDENT_AMBULATORY_CARE_PROVIDER_SITE_OTHER): Admitting: Otolaryngology

## 2024-09-01 ENCOUNTER — Telehealth: Payer: Self-pay | Admitting: Pharmacy Technician

## 2024-09-01 ENCOUNTER — Other Ambulatory Visit (HOSPITAL_COMMUNITY): Payer: Self-pay

## 2024-09-01 ENCOUNTER — Encounter

## 2024-09-01 ENCOUNTER — Ambulatory Visit: Admitting: Pulmonary Disease

## 2024-09-01 NOTE — Telephone Encounter (Signed)
 Pharmacy Patient Advocate Encounter  Received notification from [facility] that Prior Authorization for Jardiance  10MG  tablets has been APPROVED from 09/01/2024 to 09/01/2025.   PA #/Case ID/Reference #: [id number]

## 2024-09-01 NOTE — Telephone Encounter (Signed)
 Pharmacy Patient Advocate Encounter   Received notification from CoverMyMeds that prior authorization for Jardiance  10MG  tablets is due for renewal.   Insurance verification completed.   The patient is insured through [facility].  Action: PA required; PA started via CoverMyMeds. KEY [id number] . Waiting for clinical questions to populate.

## 2024-09-01 NOTE — Telephone Encounter (Signed)
 Clinical questions and answers have been submitted. PA is pending.

## 2024-09-09 ENCOUNTER — Ambulatory Visit

## 2024-09-09 ENCOUNTER — Ambulatory Visit: Admitting: Pulmonary Disease

## 2024-09-09 DIAGNOSIS — R0602 Shortness of breath: Secondary | ICD-10-CM | POA: Diagnosis not present

## 2024-09-09 LAB — PULMONARY FUNCTION TEST
DL/VA % pred: 99 %
DL/VA: 4.46 ml/min/mmHg/L
DLCO unc % pred: 90 %
DLCO unc: 32.01 ml/min/mmHg
FEF 25-75 Post: 6.32 L/s
FEF 25-75 Pre: 6.02 L/s
FEF2575-%Change-Post: 5 %
FEF2575-%Pred-Post: 146 %
FEF2575-%Pred-Pre: 139 %
FEV1-%Change-Post: 2 %
FEV1-%Pred-Post: 100 %
FEV1-%Pred-Pre: 98 %
FEV1-Post: 4.82 L
FEV1-Pre: 4.72 L
FEV1FVC-%Change-Post: 0 %
FEV1FVC-%Pred-Pre: 110 %
FEV6-%Change-Post: 2 %
FEV6-%Pred-Post: 92 %
FEV6-%Pred-Pre: 90 %
FEV6-Post: 5.52 L
FEV6-Pre: 5.4 L
FEV6FVC-%Pred-Post: 102 %
FEV6FVC-%Pred-Pre: 102 %
FVC-%Change-Post: 2 %
FVC-%Pred-Post: 90 %
FVC-%Pred-Pre: 88 %
FVC-Post: 5.52 L
FVC-Pre: 5.4 L
Post FEV1/FVC ratio: 87 %
Post FEV6/FVC ratio: 100 %
Pre FEV1/FVC ratio: 87 %
Pre FEV6/FVC Ratio: 100 %
RV % pred: 121 %
RV: 2.64 L
TLC % pred: 101 %
TLC: 8.06 L

## 2024-09-09 NOTE — Patient Instructions (Signed)
 Full PFT completed today ? ?

## 2024-09-09 NOTE — Progress Notes (Signed)
 Full PFT completed today ? ?

## 2024-09-13 ENCOUNTER — Other Ambulatory Visit: Payer: Self-pay | Admitting: Internal Medicine

## 2024-09-13 DIAGNOSIS — N1831 Chronic kidney disease, stage 3a: Secondary | ICD-10-CM

## 2024-09-15 ENCOUNTER — Ambulatory Visit: Admitting: Pulmonary Disease

## 2024-09-15 ENCOUNTER — Encounter: Payer: Self-pay | Admitting: Pulmonary Disease

## 2024-09-15 VITALS — BP 120/82 | HR 70 | Temp 97.1°F | Ht 75.0 in | Wt 266.8 lb

## 2024-09-15 DIAGNOSIS — G4733 Obstructive sleep apnea (adult) (pediatric): Secondary | ICD-10-CM | POA: Diagnosis not present

## 2024-09-15 DIAGNOSIS — J329 Chronic sinusitis, unspecified: Secondary | ICD-10-CM

## 2024-09-15 DIAGNOSIS — R0609 Other forms of dyspnea: Secondary | ICD-10-CM

## 2024-09-15 NOTE — Progress Notes (Unsigned)
 Synopsis: Referred in by Malka Domino, MD   Subjective:   PATIENT ID: Elijah Shaffer GENDER: male DOB: 05-26-1981, MRN: [id number]  Chief Complaint  Patient presents with  . Shortness of Breath    No SOB, wheezing, or cough. Symbicort -     HPI Elijah Shaffer is a 43 year old male, never smoker with history of GERD, large hiatal hernia, seasonal allergies and OSA who returns to pulmonary clinic for acute visit.   He returns for acute visit, increased dyspnea and chest discomfort with exertional work. This has progressed over recent weeks. He also feels that the sleep mask is suffocating him at night. I reviewd his PFTs from 2022 which showed some response to bronchodilators in FVC with a change of 0.3L but 5% granted does not meet ATS criteria. He does report allergic rhinitis and heat and humidity hypersensivity. He might have a component of asthma and we decided to trial symbicort  which he is agreeable with and will follow up with Dr. Kara in 3 months. Also discussed scheduling with a sleep specialist and he is agreeable with and will schedule an appt with Dr. Jess.    OV 01/01/23 He was feeling well for a few months, but recently he has noticed events of shortness of breath, tired feeling in his legs and light headed. He has noticed these symptoms with exertion. No cough or wheezing.   He has not been consistently using his CPAP machine over recent months. He is having trouble with mask leak and fit.    OV 02/15/22 He was started on CPAP therapy and his CPAP download shows 100% compliance with 0.7 AHI. He initially had a full face mask which was switched to a hybrid nasal pillow full face mask due to the skin on his nose getting sore.   He is following with Neurology. He had an abnormal EMG and has elevated protein on CSF evaluation. There are CSF tests pending at this time.    OV 01/02/22 He continues to have easy fatigability and dyspnea with exertion. Heart cath 2/1 shows  normal coronary arteries and normal hemodynamics. ICS/LABA inhaler therapy stopped at last visit with no changes in his symptoms.   He is being evaluated by rheumatology for elevated CK level and RNP ab level. He is being evaluated for numbness and tingling symptoms by neurology and is ordered for an EMG and cervical spine MRI.    He went to urgent care on 2/16 for shortness of breath and chest pains. Given patient's concerns for tick exposures, lyme and rocky mountain spotted fever labs ordered. RMSF IgG is negative and IgM is elevated at 1.67. D-dimer 2/16 was not elevated. He saw his PCP on 12/31/21 and was prescribed doxycyline but has not taken this medication yet.    Home sleep study 12/12/21 showed moderate obstructive sleep apnea.    OV 10/31/21 HRCT chest does not indicate ILD involvement. There is a large hiatal hernia noted. Pulmonary function tests show mild diffusion defect with normal spirometry and normal TLC. It was difficult to get consistent data during the pulmonary function testing as the patient was getting fatigued.   Echo from 02/24/21 showed mildly dilated RV with normal function.    CK level is mildly elevated at 356 and ENA RNP ab is elevated. Remaining inflammatory workup is unremarkable which included myositis panel, ANA, CCP, dsDNA, RF, ESR, CRP and ddimer. Hypersensitivity pneumonitis panel is negative.   He continues to have exertional dyspnea and easily fatigued with  exertion. He describes a pre-syncopal event when working recently. He continues to work as a administrator.    He reports a small raised dry patch of skin on left lower extremity, lateral side and similar rash on right lateral ankle that is itchy. He also reports feeling pain in his legs when laying down at night, even with out a hard days work.      OV 10/02/21 He called in after being seen on 11/18 complaining of similar complaints as discussed in the office visit that day. He complains of more fatigue  than normal and shortness of breath with exertion. He has felt lightheaded with exertion before. He feels very run down. He denies any fevers or chills. His appetite remains good.   OV 09/28/21 He continues to experience exertional dyspnea. He was trialed on as needed albuterol  with some improvement but continues to experience significant dyspnea with exertion. For example, he was short of breath walking around [employer] this past week. He denies wheezing. He does have cough with greenish-yellowish sputum.    Nasal congestion is improved with fluticasone  nasal spray. He is in leaf season now as a administrator.    He was unable to have PFTs done since last visit as his mother was recently diagnosed with cervical cancer and they were occupied with her treatment schedule.    He denies leg swelling, chest pain or syncopal episodes.   He raised the question with his previous work exposures using round-up and other herbicide chemicals could lead to his breathing issues.   OV 07/24/21 He reports shortness of breath over the last 4 months.  The shortness of breath is evident with exertion and resolves with rest.  Patient has been treated for tickborne illness with concern for Ehrlichia and has been on doxycycline .  He is being evaluated by infectious disease today.  He has been much more inactive over these last 4 months.  He works as a administrator and has not been working as much due to the shortness of breath.  He denies any wheezing or chest tightness.  He has intermittent dry cough that is occasionally productive with sputum.  He is being treated with PPI therapy for GERD and has a small hiatal hernia on CT imaging.   He does report seasonal allergies with nasal and sinus congestion more so in the spring and fall.   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  He denies any history of blood clots.  No family history of blood clots.  No family history of lung disease.   No history of smoking  or vaping.  He does report some intermittent secondhand smoke exposure via social gatherings.  He did grow up in a home with a wood stove.   ROS All systems were reviewed and are negative except for the above.  Objective:   Vitals:   09/15/24 1351  BP: 120/82  Pulse: 70  Temp: (!) 97.1 F (36.2 C)  SpO2: 94%  Weight: 266 lb 12.8 oz (121 kg)  Height: 6' 3 (1.905 m)   94% on RA BMI Readings from Last 3 Encounters:  09/15/24 33.35 kg/m  09/09/24 33.50 kg/m  08/17/24 33.77 kg/m   Wt Readings from Last 3 Encounters:  09/15/24 266 lb 12.8 oz (121 kg)  09/09/24 268 lb (121.6 kg)  08/17/24 270 lb 3.2 oz (122.6 kg)    Physical Exam GEN: NAD, Healthy Appearing HEENT: Supple Neck, Reactive Pupils, EOMI  CVS: Normal S1, Normal S2,  RRR, No murmurs or ES appreciated  Lungs: Clear bilateral air entry.  Abdomen: Soft, non tender, non distended, + BS  Extremities: Warm and well perfused, No edema  Skin: No suspicious lesions appreciated  Psych: Normal Affect  Ancillary Information   CBC    Component Value Date/Time   WBC 9.4 08/17/2024 1622   WBC 11.5 (H) 03/25/2024 0852   RBC 5.86 (H) 08/17/2024 1622   RBC 5.60 03/25/2024 0852   HGB 17.2 08/17/2024 1622   HCT 51.2 (H) 08/17/2024 1622   PLT 257 08/17/2024 1622   MCV 87 08/17/2024 1622   MCH 29.4 08/17/2024 1622   MCH 28.2 03/25/2024 0852   MCHC 33.6 08/17/2024 1622   MCHC 32.4 03/25/2024 0852   RDW 12.9 08/17/2024 1622   LYMPHSABS 3.6 (H) 08/17/2024 1622   MONOABS 0.8 03/25/2024 0852   EOSABS 0.1 08/17/2024 1622   BASOSABS 0.1 08/17/2024 1622      Latest Ref Rng & Units 09/09/2024   11:50 AM 10/30/2021    9:06 AM  PFT Results  FVC-Pre L 5.40  P 5.65   FVC-Predicted Pre % 88  P 91   FVC-Post L 5.52  P 5.94   FVC-Predicted Post % 90  P 96   Pre FEV1/FVC % % 87  P 88   Post FEV1/FCV % % 87  P 82   FEV1-Pre L 4.72  P 4.97   FEV1-Predicted Pre % 98  P 101   FEV1-Post L 4.82  P 4.88   DLCO uncorrected  ml/min/mmHg 32.01  P 24.00   DLCO UNC% % 90  P 66   DLCO corrected ml/min/mmHg  23.19   DLCO COR %Predicted %  64   DLVA Predicted % 99  P 88   TLC L 8.06  P 7.27   TLC % Predicted % 101  P 91   RV % Predicted % 121  P 55     P Preliminary result    Assessment & Plan:  Elijah Shaffer is a 43 year old male, never smoker with history of GERD, large hiatal hernia, seasonal allergies and OSA who returns to pulmonary clinic for acute visit.   #Shortness of breath on exertion #Chronic rhinosinusitis Possible component of asthma.   []  Trial budesonide -formoterol  [Symbicort ] 2puffs BID. Advised mouth rinsing after each use.  []  Albuterol  as needed.  []  Flonase  1spray each nostril daily.  []  PFTs   #Moderate OSA  Having difficulty with the mask, feeling of suffocation. Was inquiring about Inspire. I will have him see Sleep medicine for further management of his OSA.   No follow-ups on file.  I spent 60 minutes caring for this patient today, including preparing to see the patient, obtaining a medical history , reviewing a separately obtained history, performing a medically appropriate examination and/or evaluation, counseling and educating the patient/family/caregiver, ordering medications, tests, or procedures, documenting clinical information in the electronic health record, and independently interpreting results (not separately reported/billed) and communicating results to the patient/family/caregiver  Darrin Barn, MD [facility] 09/15/2024 1:59 PM

## 2024-09-16 ENCOUNTER — Other Ambulatory Visit (INDEPENDENT_AMBULATORY_CARE_PROVIDER_SITE_OTHER): Payer: Self-pay | Admitting: Gastroenterology

## 2024-09-21 ENCOUNTER — Telehealth (INDEPENDENT_AMBULATORY_CARE_PROVIDER_SITE_OTHER): Payer: Self-pay | Admitting: Otolaryngology

## 2024-09-21 NOTE — Telephone Encounter (Signed)
 Patient left voicemail in regards to questions about inspire. He can be reached at [phone]

## 2024-09-27 ENCOUNTER — Ambulatory Visit: Admitting: Sleep Medicine

## 2024-09-28 ENCOUNTER — Ambulatory Visit: Admitting: Dermatology

## 2024-09-29 ENCOUNTER — Other Ambulatory Visit: Payer: Self-pay | Admitting: Internal Medicine

## 2024-09-29 DIAGNOSIS — E1122 Type 2 diabetes mellitus with diabetic chronic kidney disease: Secondary | ICD-10-CM

## 2024-10-05 ENCOUNTER — Inpatient Hospital Stay: Attending: Oncology

## 2024-10-05 ENCOUNTER — Encounter (INDEPENDENT_AMBULATORY_CARE_PROVIDER_SITE_OTHER): Payer: Self-pay | Admitting: Gastroenterology

## 2024-10-05 DIAGNOSIS — D751 Secondary polycythemia: Secondary | ICD-10-CM | POA: Insufficient documentation

## 2024-10-05 LAB — CBC WITH DIFFERENTIAL/PLATELET
Abs Immature Granulocytes: 0.03 K/uL (ref 0.00–0.07)
Basophils Absolute: 0.1 K/uL (ref 0.0–0.1)
Basophils Relative: 1 %
Eosinophils Absolute: 0.1 K/uL (ref 0.0–0.5)
Eosinophils Relative: 1 %
HCT: 47.7 % (ref 39.0–52.0)
Hemoglobin: 16.4 g/dL (ref 13.0–17.0)
Immature Granulocytes: 0 %
Lymphocytes Relative: 32 %
Lymphs Abs: 2.9 K/uL (ref 0.7–4.0)
MCH: 29 pg (ref 26.0–34.0)
MCHC: 34.4 g/dL (ref 30.0–36.0)
MCV: 84.3 fL (ref 80.0–100.0)
Monocytes Absolute: 0.8 K/uL (ref 0.1–1.0)
Monocytes Relative: 9 %
Neutro Abs: 5.1 K/uL (ref 1.7–7.7)
Neutrophils Relative %: 57 %
Platelets: 245 K/uL (ref 150–400)
RBC: 5.66 MIL/uL (ref 4.22–5.81)
RDW: 13.4 % (ref 11.5–15.5)
WBC: 9 K/uL (ref 4.0–10.5)
nRBC: 0 % (ref 0.0–0.2)

## 2024-10-05 LAB — COMPREHENSIVE METABOLIC PANEL WITH GFR
ALT: 56 U/L — ABNORMAL HIGH (ref 0–44)
AST: 45 U/L — ABNORMAL HIGH (ref 15–41)
Albumin: 4.8 g/dL (ref 3.5–5.0)
Alkaline Phosphatase: 81 U/L (ref 38–126)
Anion gap: 13 (ref 5–15)
BUN: 15 mg/dL (ref 6–20)
CO2: 21 mmol/L — ABNORMAL LOW (ref 22–32)
Calcium: 9.6 mg/dL (ref 8.9–10.3)
Chloride: 109 mmol/L (ref 98–111)
Creatinine, Ser: 1.36 mg/dL — ABNORMAL HIGH (ref 0.61–1.24)
GFR, Estimated: >60 mL/min (ref >60)
Glucose, Bld: 83 mg/dL (ref 70–99)
Potassium: 3.6 mmol/L (ref 3.5–5.1)
Sodium: 143 mmol/L (ref 135–145)
Total Bilirubin: 1 mg/dL (ref 0.0–1.2)
Total Protein: 7.1 g/dL (ref 6.5–8.1)

## 2024-10-05 LAB — VITAMIN B12: Vitamin B-12: 581 pg/mL (ref 180–914)

## 2024-10-06 ENCOUNTER — Inpatient Hospital Stay

## 2024-10-08 LAB — METHYLMALONIC ACID, SERUM: Methylmalonic Acid, Quantitative: 148 nmol/L (ref 0–378)

## 2024-10-10 ENCOUNTER — Other Ambulatory Visit: Payer: Self-pay | Admitting: Internal Medicine

## 2024-10-10 DIAGNOSIS — E1122 Type 2 diabetes mellitus with diabetic chronic kidney disease: Secondary | ICD-10-CM

## 2024-10-14 ENCOUNTER — Encounter: Payer: Self-pay | Admitting: Internal Medicine

## 2024-10-14 ENCOUNTER — Inpatient Hospital Stay: Attending: Oncology | Admitting: Oncology

## 2024-10-14 DIAGNOSIS — D751 Secondary polycythemia: Secondary | ICD-10-CM

## 2024-10-14 NOTE — Assessment & Plan Note (Addendum)
-   Labs from 10/05/2024 showed a hemoglobin of 16.4 with normal differential.  Hematocrit 47.7. 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  Recommend improved compliance with CPAP. - No indication for therapeutic phlebotomy unless severely symptomatic or HCT >54.0% - We will recheck labs and see patient for follow-up visit in 6 months.  If hemoglobin remains normal over the next 6 to 12 months, would consider discharge to PCP. - Would consider bone marrow biopsy if unexplained elevations in hemoglobin or expansion to other cell lines

## 2024-10-14 NOTE — Progress Notes (Signed)
 [facility] OFFICE PROGRESS NOTE  Tobie Suzzane POUR, MD  ASSESSMENT & PLAN:   I connected with Elijah Shaffer on 10/14/24 at  2:30 PM EST by telephone visit and verified that I am speaking with the correct person using two identifiers.   I discussed the limitations, risks, security and privacy concerns of performing an evaluation and management service by telemedicine and the availability of in-person appointments. I also discussed with the patient that there may be a patient responsible charge related to this service. The patient expressed understanding and agreed to proceed.   Other persons participating in the visit and their role in the encounter: Np, Patient   Patient's location: Home  Provider's location: Clinic  Assessment & Plan Erythrocytosis - Labs from 10/05/2024 showed a hemoglobin of 16.4 with normal differential.  Hematocrit 47.7. 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  Recommend improved compliance with CPAP. - No indication for therapeutic phlebotomy unless severely symptomatic or HCT >54.0% - We will recheck labs and see patient for follow-up visit in 6 months.  If hemoglobin remains normal over the next 6 to 12 months, would consider discharge to PCP. - Would consider bone marrow biopsy if unexplained elevations in hemoglobin or expansion to other cell lines  No orders of the defined types were placed in this encounter.   INTERVAL HISTORY: Patient returns for follow-up for secondary erythrocytosis.  Patient had a low-dose phlebotomy (2050 mL) on 08/08/2023 given for extreme fatigue and headache without significant change in his symptoms.  Overall, he has done well.  Reports appetite is 100% energy levels are 50%.  Reports a 10 out of 10 generalized joint pain arthritis since the weather has changed.  Patient has history of elevated LFTs intermittently likely related to severe hepatic  steatosis   Patient is scheduled for an drug-induced sleep endoscopy with Dr. Tobie on 10/25/2024.  Patient continues to have trouble with his CPAP.  He is unable to wear it which is likely contributing to his daytime sleepiness.   We reviewed CBC, CMP and B12.  SUMMARY OF HEMATOLOGIC HISTORY: Oncology History   No history exists.     CBC    Component Value Date/Time   WBC 9.0 10/05/2024 1436   RBC 5.66 10/05/2024 1436   HGB 16.4 10/05/2024 1436   HGB 17.2 08/17/2024 1622   HCT 47.7 10/05/2024 1436   HCT 51.2 (H) 08/17/2024 1622   PLT 245 10/05/2024 1436   PLT 257 08/17/2024 1622   MCV 84.3 10/05/2024 1436   MCV 87 08/17/2024 1622   MCH 29.0 10/05/2024 1436   MCHC 34.4 10/05/2024 1436   RDW 13.4 10/05/2024 1436   RDW 12.9 08/17/2024 1622   LYMPHSABS 2.9 10/05/2024 1436   LYMPHSABS 3.6 (H) 08/17/2024 1622   MONOABS 0.8 10/05/2024 1436   EOSABS 0.1 10/05/2024 1436   EOSABS 0.1 08/17/2024 1622   BASOSABS 0.1 10/05/2024 1436   BASOSABS 0.1 08/17/2024 1622       Latest Ref Rng & Units 10/05/2024    2:36 PM 08/17/2024    4:22 PM 04/07/2024   10:31 AM  CMP  Glucose 70 - 99 mg/dL 83  81  874   BUN 6 - 20 mg/dL 15  14  17    Creatinine 0.61 - 1.24 mg/dL 8.63  8.46  8.76   Sodium 135 - 145 mmol/L 143  144  139   Potassium 3.5 - 5.1 mmol/L 3.6  4.2  4.1   Chloride 98 - 111 mmol/L 109  104  104   CO2 22 - 32 mmol/L 21  23  17    Calcium  8.9 - 10.3 mg/dL 9.6  89.3  9.9   Total Protein 6.5 - 8.1 g/dL 7.1  7.2  7.1   Total Bilirubin 0.0 - 1.2 mg/dL 1.0  0.4  0.6   Alkaline Phos 38 - 126 U/L 81  95  100   AST 15 - 41 U/L 45  35  20   ALT 0 - 44 U/L 56  42  29      Lab Results  Component Value Date   FERRITIN 133 03/07/2022   VITAMINB12 581 10/05/2024    There were no vitals filed for this visit.  Review of System:  Review of Systems  Constitutional:  Positive for malaise/fatigue. Negative for weight loss.  Musculoskeletal:  Positive for joint pain.   Psychiatric/Behavioral:  The patient has insomnia.     Physical Exam: Physical Exam Neurological:     Mental Status: He is alert and oriented to person, place, and time.     I provided 20 minutes of non face-to-face telephone visit time during this encounter, and > 50% was spent counseling as documented under my assessment & plan. ,  Delon Hope, NP 10/14/2024 2:52 PM

## 2024-10-18 ENCOUNTER — Other Ambulatory Visit: Payer: Self-pay

## 2024-10-18 ENCOUNTER — Encounter (HOSPITAL_BASED_OUTPATIENT_CLINIC_OR_DEPARTMENT_OTHER): Payer: Self-pay | Admitting: *Deleted

## 2024-10-18 NOTE — Progress Notes (Signed)
   10/18/24 1554  PAT Phone Screen  Is the patient taking a GLP-1 receptor agonist? Yes  Has the patient been informed on holding medication? Yes (12/7 last dose of Trulicuty)  Do You Have Diabetes? Yes  Do You Have Hypertension? Yes  Have You Ever Been to the ER for Asthma? No  Have You Taken Oral Steroids in the Past 3 Months? No  Do you Take Phenteramine or any Other Diet Drugs? No  Recent  Lab Work, EKG, CXR? Yes  Where was this test performed? 10/05/24 cmp cbc  EKG 06/16/24  Do you have a history of heart problems? Yes  Cardiologist Name last OV w/ Dr Anner 04/26/24 Please see note  Have you ever had tests on your heart? Yes  Results viewable: Care Everywhere  Any Recent Hospitalizations? No  Height 6' 3 (1.905 m)  Weight 121 kg  Pat Appointment Scheduled No  Reason for No Appointment Not Needed

## 2024-10-25 ENCOUNTER — Encounter (HOSPITAL_BASED_OUTPATIENT_CLINIC_OR_DEPARTMENT_OTHER): Payer: Self-pay

## 2024-10-25 ENCOUNTER — Telehealth (INDEPENDENT_AMBULATORY_CARE_PROVIDER_SITE_OTHER): Payer: Self-pay | Admitting: Otolaryngology

## 2024-10-25 ENCOUNTER — Ambulatory Visit (HOSPITAL_BASED_OUTPATIENT_CLINIC_OR_DEPARTMENT_OTHER)
Admission: RE | Admit: 2024-10-25 | Discharge: 2024-10-25 | Disposition: A | Attending: Otolaryngology | Admitting: Otolaryngology

## 2024-10-25 ENCOUNTER — Other Ambulatory Visit: Payer: Self-pay

## 2024-10-25 ENCOUNTER — Ambulatory Visit (HOSPITAL_BASED_OUTPATIENT_CLINIC_OR_DEPARTMENT_OTHER)

## 2024-10-25 ENCOUNTER — Encounter (HOSPITAL_BASED_OUTPATIENT_CLINIC_OR_DEPARTMENT_OTHER): Admission: RE | Disposition: A | Payer: Self-pay | Source: Home / Self Care | Attending: Otolaryngology

## 2024-10-25 DIAGNOSIS — E1122 Type 2 diabetes mellitus with diabetic chronic kidney disease: Secondary | ICD-10-CM | POA: Diagnosis not present

## 2024-10-25 DIAGNOSIS — E119 Type 2 diabetes mellitus without complications: Secondary | ICD-10-CM

## 2024-10-25 DIAGNOSIS — G4733 Obstructive sleep apnea (adult) (pediatric): Secondary | ICD-10-CM

## 2024-10-25 DIAGNOSIS — Z789 Other specified health status: Secondary | ICD-10-CM

## 2024-10-25 DIAGNOSIS — N189 Chronic kidney disease, unspecified: Secondary | ICD-10-CM | POA: Diagnosis not present

## 2024-10-25 DIAGNOSIS — I10 Essential (primary) hypertension: Secondary | ICD-10-CM | POA: Diagnosis not present

## 2024-10-25 DIAGNOSIS — I129 Hypertensive chronic kidney disease with stage 1 through stage 4 chronic kidney disease, or unspecified chronic kidney disease: Secondary | ICD-10-CM | POA: Diagnosis not present

## 2024-10-25 DIAGNOSIS — K219 Gastro-esophageal reflux disease without esophagitis: Secondary | ICD-10-CM | POA: Diagnosis not present

## 2024-10-25 DIAGNOSIS — K7581 Nonalcoholic steatohepatitis (NASH): Secondary | ICD-10-CM | POA: Insufficient documentation

## 2024-10-25 HISTORY — PX: DRUG INDUCED ENDOSCOPY: SHX6808

## 2024-10-25 LAB — GLUCOSE, CAPILLARY
Glucose-Capillary: 143 mg/dL — ABNORMAL HIGH (ref 70–99)
Glucose-Capillary: 102 mg/dL — ABNORMAL HIGH (ref 70–99)

## 2024-10-25 SURGERY — DRUG INDUCED SLEEP ENDOSCOPY
Anesthesia: Monitor Anesthesia Care | Site: Nose

## 2024-10-25 MED ORDER — OXYCODONE HCL 5 MG PO TABS: 5.0000 mg | ORAL_TABLET | Freq: Once | ORAL | Status: DC | PRN

## 2024-10-25 MED ORDER — PROPOFOL 10 MG/ML IV BOLUS
INTRAVENOUS | Status: AC
  Filled 2024-10-25: qty 20

## 2024-10-25 MED ORDER — AMISULPRIDE (ANTIEMETIC) 5 MG/2ML IV SOLN: 10.0000 mg | Freq: Once | INTRAVENOUS | Status: DC | PRN

## 2024-10-25 MED ORDER — PROPOFOL 10 MG/ML IV BOLUS
INTRAVENOUS | Status: DC | PRN
  Administered 2024-10-25 (×2): 20 mg via INTRAVENOUS

## 2024-10-25 MED ORDER — PROPOFOL 500 MG/50ML IV EMUL
INTRAVENOUS | Status: DC | PRN
  Administered 2024-10-25: 10:00:00 75 ug/kg/min via INTRAVENOUS

## 2024-10-25 MED ORDER — FENTANYL CITRATE (PF) 100 MCG/2ML IJ SOLN: 25.0000 ug | INTRAMUSCULAR | Status: DC | PRN

## 2024-10-25 MED ORDER — OXYCODONE HCL 5 MG/5ML PO SOLN: 5.0000 mg | Freq: Once | ORAL | Status: DC | PRN

## 2024-10-25 MED ORDER — ONDANSETRON HCL 4 MG/2ML IJ SOLN: 4.0000 mg | Freq: Once | INTRAMUSCULAR | Status: DC | PRN

## 2024-10-25 MED ORDER — LACTATED RINGERS IV SOLN: INTRAVENOUS | Status: DC

## 2024-10-25 MED ORDER — OXYMETAZOLINE HCL 0.05 % NA SOLN
NASAL | Status: DC | PRN
  Administered 2024-10-25: 10:00:00 1 via TOPICAL

## 2024-10-25 MED ORDER — ACETAMINOPHEN 10 MG/ML IV SOLN: 1000.0000 mg | Freq: Once | INTRAVENOUS | Status: DC | PRN

## 2024-10-25 SURGICAL SUPPLY — 11 items
CANISTER SUCT 1200ML W/VALVE (MISCELLANEOUS) ×1 IMPLANT
GLOVE BIO SURGEON STRL SZ 6.5 (GLOVE) ×1 IMPLANT
KIT CLEAN ENDO (MISCELLANEOUS) ×1 IMPLANT
NDL PRECISIONGLIDE 27X1.5 (NEEDLE) IMPLANT
NEEDLE PRECISIONGLIDE 27X1.5 (NEEDLE) IMPLANT
PATTIES SURGICAL .5 X3 (DISPOSABLE) ×1 IMPLANT
SHEET MEDIUM DRAPE 40X70 STRL (DRAPES) ×1 IMPLANT
SOLUTION ANTFG W/FOAM PAD STRL (MISCELLANEOUS) ×1 IMPLANT
SYR CONTROL 10ML LL (SYRINGE) IMPLANT
TOWEL GREEN STERILE FF (TOWEL DISPOSABLE) ×1 IMPLANT
TUBE CONNECTING 20X1/4 (TUBING) IMPLANT

## 2024-10-25 NOTE — Telephone Encounter (Signed)
 Posted for inspire

## 2024-10-25 NOTE — Transfer of Care (Signed)
 Immediate Anesthesia Transfer of Care Note  Patient: Elijah Shaffer  Procedure(s) Performed: DRUG INDUCED SLEEP ENDOSCOPY (Nose)  Patient Location: PACU  Anesthesia Type:MAC  Level of Consciousness: awake, alert , and oriented  Airway & Oxygen  Therapy: Patient Spontanous Breathing  Post-op Assessment: Report given to RN and Post -op Vital signs reviewed and stable  Post vital signs: Reviewed and stable  Last Vitals:  Vitals Value Taken Time  BP 134/92 10/25/24 10:35  Temp    Pulse 76 10/25/24 10:36  Resp    SpO2 95 % 10/25/24 10:36  Vitals shown include unfiled device data.  Last Pain:  Vitals:   10/25/24 0818  TempSrc: Tympanic  PainSc: 0-No pain      Patients Stated Pain Goal: 7 (10/25/24 0818)  Complications: No notable events documented.

## 2024-10-25 NOTE — Op Note (Signed)
 Otolaryngology Operative note  GILMORE LIST Date/Time of Admission: 10/25/2024  7:59 AM  CSN: [id number];MRN:[id number]  DOB: 1981/07/02 Age: 43 y.o. Location: [facility]    Pre-Op Diagnosis: MODERATE  TO SEVERE OBSTRUCTIVE SLEEP APNEA INTOLERANCE OF CONTINUOUS POSITIVE AIRWAY PRESSURE VENTILATION   Post-Op Diagnosis: Same   Procedure: Procedure(s): DRUG INDUCED SLEEP ENDOSCOPY USING ENID BREWSTER- CPT [phone]  Surgeon: Eldora Blanch, MD  Anesthesia type:  MAC  Anesthesiologist: CRNA: Julieanne Fairy BROCKS, CRNA   Staff: Circulator: Elaine Avelina PARAS, RN Scrub Person: Mannie Ellouise LABOR, RN  Implants: * No implants in log *  Specimens: * No specimens in log *  EBL: minimal  Drains: None  Post-op disposition and condition: PACU, hemodynamically stable  Findings: There was no evidence of complete concentric palatal obstruction and patient appears to be a candidate anatomically for hypoglossal nerve stimulation therapy.    Complications: None apparent  Indications and consent:  Elijah Shaffer is a 43 y.o. male with obstructive sleep apnea with intolerance of continuous positive airway pressure. As such, with a BMI of 33, patient's options were discussed including Hypoglossal nerve stimulator placement. Risks/benefits/alternatives for each option were discussed. Patient expressed understanding, and despite these risks, consented and decided to proceed with drug induced sleep endoscopy to determine stimulator placement candidacy. Informed consent was signed before proceeding.  Procedure: The patient was brought to the endoscopy room and was anesthetized via the standard drug-induced sleep endoscopy protocol using propofol  pump. The room lights were dimmed. The propofol  infusion rate was started and gradually increased at which point, conditions that mimic sleep were gradually observed.   With the patient not responsive to verbal commands, but still with  spontaneous respiration, sleep disordered breathing events were clearly observed including snoring   Under these conditions, the flexible endoscope was inserted to examine both sides of the nose as well as the pharynx and larynx. The palatal collapse under these conditions was primarily A/P with modest but not predominant lateral wall collapse and not concentric palatal collapse. With simulated jaw thrust, the hypopharyngeal obstruction and secondarily the palatal collapse improved.   In summary, there was not evidence of complete concentric palatal obstruction and patient is a candidate anatomically for hypoglossal nerve stimulation therapy.   The anesthesia was then weaned and care transferred to anesthesia who transported patient to PACU in stable condition.   I was present for and performed the entire procedure.

## 2024-10-25 NOTE — Anesthesia Preprocedure Evaluation (Addendum)
 Anesthesia Evaluation  Patient identified by MRN, date of birth, ID band Patient awake    Reviewed: Allergy & Precautions, NPO status , Patient's Chart, lab work & pertinent test results  History of Anesthesia Complications Negative for: history of anesthetic complications  Airway Mallampati: II  TM Distance: >3 FB Neck ROM: Full    Dental  (+) Dental Advisory Given, Poor Dentition   Pulmonary sleep apnea and Continuous Positive Airway Pressure Ventilation    breath sounds clear to auscultation       Cardiovascular hypertension, Pt. on medications  Rhythm:Regular Rate:Normal  RHC/LHC (2023): Assessment: 1. Normal coronary arteries 2. Normal hemodynamics 3. EF 50-55% by ventriculogram 4. Brief run of possible SVT prior to cath    Neuro/Psych  PSYCHIATRIC DISORDERS Anxiety Depression       GI/Hepatic hiatal hernia,GERD  Medicated and Controlled,,NASH   Endo/Other  diabetes, Type 2    Renal/GU Renal InsufficiencyRenal disease (Baseline Cr ~1.3)     Musculoskeletal   Abdominal   Peds  Hematology   Anesthesia Other Findings   Reproductive/Obstetrics                              Anesthesia Physical Anesthesia Plan  ASA: 2  Anesthesia Plan: MAC   Post-op Pain Management:    Induction: Intravenous  PONV Risk Score and Plan: 2 and Ondansetron , Treatment may vary due to age or medical condition and Propofol  infusion  Airway Management Planned: Natural Airway  Additional Equipment: None  Intra-op Plan:   Post-operative Plan: Extubation in OR  Informed Consent:      Dental advisory given  Plan Discussed with: CRNA and Surgeon  Anesthesia Plan Comments:          Anesthesia Quick Evaluation

## 2024-10-25 NOTE — Anesthesia Postprocedure Evaluation (Signed)
 Anesthesia Post Note  Patient: Elijah Shaffer  Procedure(s) Performed: DRUG INDUCED SLEEP ENDOSCOPY (Nose)     Patient location during evaluation: PACU Anesthesia Type: MAC Level of consciousness: awake Pain management: pain level controlled Vital Signs Assessment: post-procedure vital signs reviewed and stable Respiratory status: spontaneous breathing Cardiovascular status: blood pressure returned to baseline Postop Assessment: no apparent nausea or vomiting Anesthetic complications: no   No notable events documented.              Lauraine DASEN Colhoun

## 2024-10-25 NOTE — Discharge Instructions (Addendum)
 Post Anesthesia Guidelines  During recovery from anesthesia  You may feel drowsy and reflexes may be slowed for 24 hours:  -Do not drive, use machinery, appliances, ride bicycles or scooters  -Do not consume alcohol  -Do not make important decisions   Eating and drinking:  -Drink plenty of liquids today  -Avoid fried or spicy foods today  -Return to your regular diet slowly over the next 24 hours  -Please call if unable to keep fluids down  If your throat is sore: -A breathing tube may have been used during your surgery -Drink cool liquids or gargle with warm salt water  -If soreness lasts more than a few days, please call us   Surgery Discharge Instructions:  Call clinic or return to ED if you: - develop a fever greater than 101.4 - have shaking chills or are feeling ill - become short of breath - have uncontrollable nausea or vomiting - can't hold down food or liquids or feel as though you are getting dehydrated - any other acute events, problems, or concerns    Post Anesthesia Home Care Instructions  Activity: Get plenty of rest for the remainder of the day. A responsible individual must stay with you for 24 hours following the procedure.  For the next 24 hours, DO NOT: -Drive a car -Advertising copywriter -Drink alcoholic beverages -Take any medication unless instructed by your physician -Make any legal decisions or sign important papers.  Meals: Start with liquid foods such as gelatin or soup. Progress to regular foods as tolerated. Avoid greasy, spicy, heavy foods. If nausea and/or vomiting occur, drink only clear liquids until the nausea and/or vomiting subsides. Call your physician if vomiting continues.  Special Instructions/Symptoms: Your throat may feel dry or sore from the anesthesia or the breathing tube placed in your throat during surgery. If this causes discomfort, gargle with warm salt water . The discomfort should disappear within 24 hours.  If you had a  scopolamine patch placed behind your ear for the management of post- operative nausea and/or vomiting:  1. The medication in the patch is effective for 72 hours, after which it should be removed.  Wrap patch in a tissue and discard in the trash. Wash hands thoroughly with soap and water . 2. You may remove the patch earlier than 72 hours if you experience unpleasant side effects which may include dry mouth, dizziness or visual disturbances. 3. Avoid touching the patch. Wash your hands with soap and water  after contact with the patch.

## 2024-10-25 NOTE — H&P (Signed)
 Pre-Operative H&P - Day Of Surgery Patient Name: Elijah Shaffer Date:   10/25/2024  HPI: Elijah Shaffer is a 43 y.o. male who presents today for operative treatment of obstructive sleep apnea, intolerance of continuous positive airway pressure ventilation. Patient denies recent significant changes to health or significant new medications or physiologic change in condition which would immediately impact plans. No new types of therapy has been initiated that would change the plan or the appropriateness of the plan.   ROS:  A complete review of systems was obtained and is otherwise negative  PMH:  Past Medical History:  Diagnosis Date   Abdominal hernia    2025   Acute renal injury 06/28/2017   Anxiety    Aspiration pneumonia of right upper lobe due to gastric secretions (HCC)    Diabetes mellitus without complication (HCC)    Diverticulitis    GERD (gastroesophageal reflux disease) 01/08/2018   Hiatal hernia    2022   Hypertension    Prediabetes    Seasonal allergies    Sleep apnea    Syncope 06/26/2021    PSH:  Past Surgical History:  Procedure Laterality Date   BIOPSY  03/18/2021   Procedure: BIOPSY;  Surgeon: Eartha Angelia Sieving, MD;  [facility];  Service: Gastroenterology;;  esophageal at Z-line   BIOPSY  05/23/2021   Procedure: BIOPSY;  Surgeon: Eartha Angelia Sieving, MD;  [facility];  Service: Gastroenterology;;   BIOPSY  08/19/2023   Procedure: BIOPSY;  Surgeon: Eartha Angelia Sieving, MD;  [facility];  Service: Gastroenterology;;   CARDIOPULMONARY EXERCISE TEST (CPX)  01/09/2022   Interpretation limited by submaximal effort.  Severe functional impairment when compared to max sedentary norms.  No cardiopulmonary limitations.  Noted tremor and generalized weakness that lead to premature exercise termination-consider neuromuscular evaluation.   COLONOSCOPY N/A 09/22/2017   Procedure: COLONOSCOPY;  Surgeon: Golda Claudis PENNER, MD;   [facility];  Service: Endoscopy;  Laterality: N/A;  9:55   COLONOSCOPY WITH PROPOFOL  N/A 05/23/2021   Procedure: COLONOSCOPY WITH PROPOFOL ;  Surgeon: Eartha Angelia Sieving, MD;  [facility];  Service: Gastroenterology;  Laterality: N/A;  7:30   ESOPHAGOGASTRODUODENOSCOPY (EGD) WITH PROPOFOL  N/A 03/18/2021   Procedure: ESOPHAGOGASTRODUODENOSCOPY (EGD) WITH PROPOFOL ;  Surgeon: Eartha Angelia Sieving, MD;  [facility];  Service: Gastroenterology;  Laterality: N/A;   ESOPHAGOGASTRODUODENOSCOPY (EGD) WITH PROPOFOL  N/A 05/23/2021   Procedure: ESOPHAGOGASTRODUODENOSCOPY (EGD) WITH PROPOFOL ;  Surgeon: Eartha Angelia Sieving, MD;  [facility];  Service: Gastroenterology;  Laterality: N/A;   ESOPHAGOGASTRODUODENOSCOPY (EGD) WITH PROPOFOL  N/A 08/19/2023   Procedure: ESOPHAGOGASTRODUODENOSCOPY (EGD) WITH PROPOFOL ;  Surgeon: Eartha Angelia Sieving, MD;  [facility];  Service: Gastroenterology;  Laterality: N/A;  11:45AM;ASA 3   ESOPHAGOGASTRODUODENOSCOPY (EGD) WITH PROPOFOL  N/A 10/08/2023   Procedure: ESOPHAGOGASTRODUODENOSCOPY (EGD) WITH PROPOFOL ;  Surgeon: Eartha Angelia Sieving, MD;  [facility];  Service: Gastroenterology;  Laterality: N/A;  1:45PM;ASA 1-2   GIVENS CAPSULE STUDY N/A 10/08/2023   Procedure: GIVENS CAPSULE STUDY;  Surgeon: Eartha Angelia Sieving, MD;  [facility];  Service: Gastroenterology;  Laterality: N/A;  1:45PM;ASA 1-2   POLYPECTOMY  09/22/2017   Procedure: POLYPECTOMY;  Surgeon: Golda Claudis PENNER, MD;  [facility];  Service: Endoscopy;;   RIGHT/LEFT HEART CATH AND CORONARY ANGIOGRAPHY N/A 12/12/2021   Procedure: RIGHT/LEFT HEART CATH AND CORONARY ANGIOGRAPHY;  Surgeon: Cherrie Sieving SAUNDERS, MD;  [facility];  Service: Cardiovascular:: Normal Coronaries &  Hemodynamics.  EF 50-55%. RA = 4 mmHg, RV = 23/4; PA = 22/6 (14) & PCW = 7; Ao sat = 95%; PA sat = 76%,  76%& SVC sat = 74%Fick cardiac output/index = 7.1/3.0; PVR = 1.0 WU   TRANSTHORACIC ECHOCARDIOGRAM  02/24/2021   EF 60 to 65%.  Normal LV function.  Normal valves.  Mild RV dilation with normal function.   ZIO PATCH EVENT MONITOR  01/2022   Sinus rhythm with heart rate range 24 to 159 bpm, 1  AVB and Wenckebach block noted along with rare PACs and PVCs.  No arrhythmias.    MEDS:  Current Medications[1]  ALLERGIES: Loratadine-pseudoephedrine er and Pollen extract  EXAM: Vitals: BP (!) 133/97   Pulse 88   Temp (!) 97.3 F (36.3 C) (Tympanic)   Resp 18   Ht 6' 3 (1.905 m)   Wt 122.2 kg   SpO2 96%   BMI 33.67 kg/m   General Awake, at baseline alertness.   HEENT No scleral icterus or conjunctival hemorrhage. Globe position appears normal. External ears  normal. Nose patent without rhinorrhea. No lymphadenopathy. No thyromegaly  Cardiovascular No cyanosis.  Pulmonary No audible stridor. Breathing easily with no labor.  Neuro Symmetric facial movement.   Psychiatry Appropriate affect and mood.  Skin No scars or lesions on face or neck.  Extermities Moves all extremities with normal range of motion.   Other Findings None.   Assessment & Plan: [name] has diagnoses of obstructive sleep apnea, intolerance of continuous positive airway pressure ventilation and will go to the OR today for drug induced sleep endoscopy. Informed consent was obtained and available in EMR today. All questions have been answered, and risks/benefits/alternatives of procedure as noted in the consent were discussed in a quiet area. Questions were invited and answered. The patient expressed understanding, provided consent and wished to proceed despite risks.  [name] B [name] 10/25/2024 8:27 AM     [1]  Current Facility-Administered Medications:    lactated ringers  infusion, , Intravenous, Continuous, Hollis, Kevin D, MD

## 2024-10-26 ENCOUNTER — Encounter (HOSPITAL_BASED_OUTPATIENT_CLINIC_OR_DEPARTMENT_OTHER): Payer: Self-pay | Admitting: Otolaryngology

## 2024-10-28 DIAGNOSIS — R569 Unspecified convulsions: Secondary | ICD-10-CM | POA: Diagnosis not present

## 2024-10-28 DIAGNOSIS — Z79899 Other long term (current) drug therapy: Secondary | ICD-10-CM | POA: Diagnosis not present

## 2024-11-06 ENCOUNTER — Other Ambulatory Visit: Payer: Self-pay | Admitting: Internal Medicine

## 2024-11-06 DIAGNOSIS — N1831 Chronic kidney disease, stage 3a: Secondary | ICD-10-CM

## 2024-11-08 ENCOUNTER — Encounter: Payer: Self-pay | Admitting: *Deleted

## 2024-11-09 ENCOUNTER — Ambulatory Visit: Admitting: Dermatology

## 2024-11-09 DIAGNOSIS — L280 Lichen simplex chronicus: Secondary | ICD-10-CM

## 2024-11-09 DIAGNOSIS — L209 Atopic dermatitis, unspecified: Secondary | ICD-10-CM | POA: Diagnosis not present

## 2024-11-09 MED ORDER — TRIAMCINOLONE ACETONIDE 10 MG/ML IJ SUSP
10.0000 mg | Freq: Once | INTRAMUSCULAR | Status: AC
  Administered 2024-11-09: 5 mg

## 2024-11-09 MED ORDER — MOMETASONE FUROATE 0.1 % EX CREA: TOPICAL_CREAM | CUTANEOUS | 3 refills | Status: AC

## 2024-11-09 NOTE — Progress Notes (Unsigned)
 "  Follow-Up Visit   Subjective  Elijah Shaffer is a 43 y.o. male who presents for the following: here for 6 month atopic dermatitis follow up and reports that he is very itchy scalp   The following portions of the chart were reviewed this encounter and updated as appropriate: medications, allergies, medical history  Review of Systems:  No other skin or systemic complaints except as noted in HPI or Assessment and Plan.  Objective  Well appearing patient in no apparent distress; mood and affect are within normal limits.  A focused examination was performed of the following areas: Scalp, legs   Relevant exam findings are noted in the Assessment and Plan.  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 Currently well-controlled.  Patient has tried and failed Dupixent , and Rinvoq   Atopic dermatitis (eczema) is a chronic, relapsing, pruritic condition that can significantly affect quality of life. It is often associated with allergic rhinitis and/or asthma and can require treatment with topical medications, phototherapy, or in severe cases biologic injectable medication (Dupixent ; Adbry) or Oral JAK inhibitors.   Treatment Plan: Start mometasone cream apply daily / bid for 2 to 4 weeks if needed  Recommend using moisturizer daily after shower  Continue clobetasol  cream spot treat once to twice daily until improved more severe affected areas  Continue hydroxyzine  25 mg 1 po at bedtime as needed for itch. Continue ketoconazole  2% shampoo to scalp, let sit several minutes before rinsing.    Discussed if not improving would consider other injections such as Nemluvio or Ebglyss in future  Recommend gentle skin  care.  LICHEN SIMPLEX CHRONICUS right frontal scalp Lichen simplex chronicus (LSC) is a persistent itchy area of thickened skin that is induced by chronic rubbing and/or scratching (chronic dermatitis).  These areas may be pink, hyperpigmented and may have excoriations.  LSC is commonly observed in uncontrolled atopic dermatitis and other forms of eczema, and in other itchy skin conditions (eg, insect bites, scabies).  Sometimes it is not possible to know initial cause of LSC if it has been present for a long time.  It generally responds well to treatment with high potency topical steroids.  It is important to stop rubbing/scratching the area in order to break the itch-scratch-rash-itch cycle, in order for the rash to resolve.   Discussed ILK injections today or topical steriod   Patient prefers ILK injections   Intralesional steroid injection side effects were reviewed including thinning of the skin and discoloration, such as redness, lightening or darkening.  Discussed could consider starting topical clobetasol  drops to use at scalp will discuss further at next visit   - Intralesional injection - right frontal scalp Location: right frontal scalp  Informed Consent: Discussed risks (infection, pain, bleeding, bruising, thinning of the skin, loss of skin pigment, lack of resolution, and recurrence of lesion) and benefits of the procedure, as well as the alternatives. Informed consent was obtained. Preparation: The area was prepared a standard fashion.  Procedure Details: An intralesional injection was performed with Kenalog  5 mg/cc. 1 cc in total were injected.  Total number of injections: >7  Plan: The patient was instructed on post-op care. Recommend OTC analgesia as needed for pain.   Kenalog  10  diluted to 5 ml NDC 9996-9505-79 Lot    [id number] Exp Aug 2028   This Visit - triamcinolone  acetonide (KENALOG ) 10 MG/ML injection 10 mg ATOPIC DERMATITIS, UNSPECIFIED TYPE   This  Visit - mometasone (ELOCON) 0.1 % cream - Apply topically qd/bid for itchy areas at legs as needed for atopic dermatitis  Return for 6 - 8 week follow up on LCS and atopic dermatitis follow up.  I, Eleanor Blush, CMA, am acting as scribe for Rexene Rattler, MD.   Documentation: I have reviewed the above documentation for accuracy and completeness, and I agree with the above.  Rexene Rattler, MD    "

## 2024-11-09 NOTE — Patient Instructions (Addendum)
 For itchy skin at scalp and body  Avoid scratching Recommend a daily moisturizer after bath / shower such as cerave cream   Start mometasone 0.5 % cream apply topically to itchy areas at legs daily to twice daily for 2 to 4 weeks as needed   Topical steroids (such as triamcinolone , fluocinolone , fluocinonide , mometasone, clobetasol , halobetasol, betamethasone , hydrocortisone ) can cause thinning and lightening of the skin if they are used for too long in the same area. Your physician has selected the right strength medicine for your problem and area affected on the body. Please use your medication only as directed by your physician to prevent side effects.   For more severe areas  Continue clobetasol  cream apply as a spot treatment to severe itchy areas once to twice daily until improved  Avoid applying to face, groin, and axilla. Use as directed. Long-term use can cause thinning of the skin.  Topical steroids (such as triamcinolone , fluocinolone , fluocinonide , mometasone, clobetasol , halobetasol, betamethasone , hydrocortisone ) can cause thinning and lightening of the skin if they are used for too long in the same area. Your physician has selected the right strength medicine for your problem and area affected on the body. Please use your medication only as directed by your physician to prevent side effects.     Gentle Skin Care Guide  1. Bathe no more than once a day.  2. Avoid bathing in hot water   3. Use a mild soap like Dove, Vanicream, Cetaphil, CeraVe. Can use Lever 2000 or Cetaphil antibacterial soap  4. Use soap only where you need it. On most days, use it under your arms, between your legs, and on your feet. Let the water  rinse other areas unless visibly dirty.  5. When you get out of the bath/shower, use a towel to gently blot your skin dry, don't rub it.  6. While your skin is still a little damp, apply a moisturizing cream such as Vanicream, CeraVe, Cetaphil, Eucerin, Sarna  lotion or plain Vaseline Jelly. For hands apply Neutrogena Norwegian Hand Cream or Excipial Hand Cream.  7. Reapply moisturizer any time you start to itch or feel dry.  8. Sometimes using free and clear laundry detergents can be helpful. Fabric softener sheets should be avoided. Downy Free & Gentle liquid, or any liquid fabric softener that is free of dyes and perfumes, it acceptable to use  9. If your doctor has given you prescription creams you may apply moisturizers over them        Due to recent changes in healthcare laws, you may see results of your pathology and/or laboratory studies on MyChart before the doctors have had a chance to review them. We understand that in some cases there may be results that are confusing or concerning to you. Please understand that not all results are received at the same time and often the doctors may need to interpret multiple results in order to provide you with the best plan of care or course of treatment. Therefore, we ask that you please give us  2 business days to thoroughly review all your results before contacting the office for clarification. Should we see a critical lab result, you will be contacted sooner.   If You Need Anything After Your Visit  If you have any questions or concerns for your doctor, please call our main line at [phone] and press option 4 to reach your doctor's medical assistant. If no one answers, please leave a voicemail as directed and we will return your call as  soon as possible. Messages left after 4 pm will be answered the following business day.   You may also send us  a message via MyChart. We typically respond to MyChart messages within 1-2 business days.  For prescription refills, please ask your pharmacy to contact our office. Our fax number is [phone].  If you have an urgent issue when the clinic is closed that cannot wait until the next business day, you can page your doctor at the number below.    Please  note that while we do our best to be available for urgent issues outside of office hours, we are not available 24/7.   If you have an urgent issue and are unable to reach us , you may choose to seek medical care at your doctor's office, retail clinic, urgent care center, or emergency room.  If you have a medical emergency, please immediately call 911 or go to the emergency department.  Pager Numbers  - Dr. Hester: [phone]  - Dr. Jackquline: [phone]  - Dr. Claudene: [phone]   - Dr. Raymund: [phone]  In the event of inclement weather, please call our main line at [phone] for an update on the status of any delays or closures.  Dermatology Medication Tips: Please keep the boxes that topical medications come in in order to help keep track of the instructions about where and how to use these. Pharmacies typically print the medication instructions only on the boxes and not directly on the medication tubes.   If your medication is too expensive, please contact our office at [phone] option 4 or send us  a message through MyChart.   We are unable to tell what your co-pay for medications will be in advance as this is different depending on your insurance coverage. However, we may be able to find a substitute medication at lower cost or fill out paperwork to get insurance to cover a needed medication.   If a prior authorization is required to get your medication covered by your insurance company, please allow us  1-2 business days to complete this process.  Drug prices often vary depending on where the prescription is filled and some pharmacies may offer cheaper prices.  The website www.goodrx.com contains coupons for medications through different pharmacies. The prices here do not account for what the cost may be with help from insurance (it may be cheaper with your insurance), but the website can give you the price if you did not use any insurance.  - You can print the  associated coupon and take it with your prescription to the pharmacy.  - You may also stop by our office during regular business hours and pick up a GoodRx coupon card.  - If you need your prescription sent electronically to a different pharmacy, notify our office through [facility] or by phone at [phone] option 4.     Si Usted Necesita Algo Despus de Su Visita  Tambin puede enviarnos un mensaje a travs de Clinical Cytogeneticist. Por lo general respondemos a los mensajes de MyChart en el transcurso de 1 a 2 das hbiles.  Para renovar recetas, por favor pida a su farmacia que se ponga en contacto con nuestra oficina. Randi lakes de fax es [place] [phone].  Si tiene un asunto urgente cuando la clnica est cerrada y que no puede esperar hasta el siguiente da hbil, puede llamar/localizar a su doctor(a) al nmero que aparece a continuacin.   Por favor, tenga en cuenta que aunque hacemos todo lo posible para estar disponibles  para asuntos urgentes fuera del horario de [place], no estamos disponibles las 24 horas del da, los 7 [address]  [phone] de la [place].   Si tiene un problema urgente y no puede comunicarse con nosotros, puede optar por buscar atencin mdica  en el consultorio de su doctor(a), en una clnica privada, en un centro de atencin urgente o en una sala de emergencias.  Si tiene engineer, drilling, por favor llame inmediatamente al 911 o vaya a la sala de emergencias.  Nmeros de bper  - Dr. Hester: [phone]  - Dra. Jackquline: [phone]  - Dr. Claudene: [phone]  - Dra. Kitts: [phone]  En caso de inclemencias del [place], por favor llame a nuestra lnea principal al [phone] para una actualizacin sobre el estado de cualquier retraso o cierre.  Consejos para la medicacin en dermatologa: Por favor, guarde las cajas en las que vienen los medicamentos de uso tpico para ayudarle a seguir las instrucciones sobre dnde y cmo usarlos. Las farmacias  generalmente imprimen las instrucciones del medicamento slo en las cajas y no directamente en los tubos del [place].   Si su medicamento es muy caro, por favor, pngase en contacto con landry rieger llamando al [phone] y presione la opcin 4 o envenos un mensaje a travs de Clinical Cytogeneticist.   No podemos decirle cul ser su copago por los medicamentos por adelantado ya que esto es diferente dependiendo de la cobertura de su seguro. Sin embargo, es posible que podamos encontrar un medicamento sustituto a audiological scientist un formulario para que el seguro cubra el medicamento que se considera necesario.   Si se requiere una autorizacin previa para que su compaa de seguros cubra su medicamento, por favor permtanos de 1 a 2 das hbiles para completar este proceso.  Los precios de los medicamentos varan con frecuencia dependiendo del environmental consultant de dnde se surte la receta y alguna farmacias pueden ofrecer precios ms baratos.  El sitio web www.goodrx.com tiene cupones para medicamentos de health and safety inspector. Los precios aqu no tienen en cuenta lo que podra costar con la ayuda del seguro (puede ser ms barato con su seguro), pero el sitio web puede darle el precio si no utiliz tourist information centre manager.  - Puede imprimir el cupn correspondiente y llevarlo con su receta a la farmacia.  - Tambin puede pasar por nuestra oficina durante el horario de atencin regular y education officer, museum una tarjeta de cupones de GoodRx.  - Si necesita que su receta se enve electrnicamente a una farmacia diferente, informe a nuestra oficina a travs de MyChart de [place] o por telfono llamando al [phone] y presione la opcin 4.

## 2024-11-12 ENCOUNTER — Telehealth: Payer: Self-pay | Admitting: Pharmacy Technician

## 2024-11-12 ENCOUNTER — Other Ambulatory Visit (HOSPITAL_COMMUNITY): Payer: Self-pay

## 2024-11-12 NOTE — Telephone Encounter (Signed)
" ° °  Can't do in latent nor promptpa under [employer]      Ran test claim for fenofibrate  134mg  capsules. For a 30 day supply and the co-pay is 5.39 . PA is not needed at this time. This test claim was processed through [facility]- copay amounts may vary at other pharmacies due to pharmacy/plan contracts, or as the patient moves through the different stages of their insurance plan.    "

## 2024-11-12 NOTE — Telephone Encounter (Signed)
 Hi, insurance is wanting fenofibrate  134mg  capsules instead of fenofibrate  145mg . They said the 145mg  is not covered/benefit excluded. He has a new insurance that started 11/11/24. Can he please be changed to fenofibrate  134mg  and the 134mg  prescription be sent to his pharmacy? Thank you!    Ran test claim for fenofibrate  134mg  capsules. For a 30 day supply and the co-pay is 5.39 .

## 2024-11-15 ENCOUNTER — Telehealth: Payer: Self-pay | Admitting: Pharmacy Technician

## 2024-11-15 ENCOUNTER — Other Ambulatory Visit (HOSPITAL_COMMUNITY): Payer: Self-pay

## 2024-11-15 NOTE — Telephone Encounter (Signed)
 Pharmacy Patient Advocate Encounter   Received notification from [facility] KEY that prior authorization for Dexcom G7 Sensor is required/requested.   Insurance verification completed.   The patient is insured through [facility].   Per test claim: PA required; PA started via CoverMyMeds. KEY [id number] . Waiting for clinical questions to populate.

## 2024-11-16 ENCOUNTER — Other Ambulatory Visit (HOSPITAL_COMMUNITY): Payer: Self-pay

## 2024-11-16 ENCOUNTER — Other Ambulatory Visit: Payer: Self-pay

## 2024-11-16 ENCOUNTER — Telehealth (INDEPENDENT_AMBULATORY_CARE_PROVIDER_SITE_OTHER): Payer: Self-pay

## 2024-11-16 MED ORDER — FENOFIBRATE 134 MG PO CAPS: 134.0000 mg | ORAL_CAPSULE | Freq: Every day | ORAL | 3 refills | Status: AC

## 2024-11-16 MED ORDER — FENOFIBRATE 134 MG PO CAPS
134.0000 mg | ORAL_CAPSULE | Freq: Every day | ORAL | 3 refills | Status: DC
  Filled 2024-11-16: qty 90, 90d supply, fill #0

## 2024-11-16 NOTE — Telephone Encounter (Signed)
 PT is requesting prescription to be sent to  [id number] - [place], [place] - [address]     Pt Stated it was sent to the wrong pharmacy

## 2024-11-16 NOTE — Telephone Encounter (Signed)
 Spoke to patient informing him that provider would like patient to come in just for a weight check for Inspire procedure. Patient stated he would try to come in 01/07.

## 2024-11-16 NOTE — Telephone Encounter (Signed)
 Medication sent to preferred pharmacy

## 2024-11-16 NOTE — Telephone Encounter (Signed)
Patient is aware of the medication change 

## 2024-11-16 NOTE — Addendum Note (Signed)
 Addended by: GLADIS REENA GAILS on: 11/16/2024 10:14 AM   Modules accepted: Orders

## 2024-11-16 NOTE — Telephone Encounter (Signed)
 Clinical questions have been answered and PA submitted. PA currently Pending.

## 2024-11-17 ENCOUNTER — Other Ambulatory Visit (HOSPITAL_COMMUNITY): Payer: Self-pay

## 2024-11-17 ENCOUNTER — Encounter: Payer: Self-pay | Admitting: Internal Medicine

## 2024-11-17 ENCOUNTER — Other Ambulatory Visit: Payer: Self-pay

## 2024-11-18 ENCOUNTER — Telehealth (INDEPENDENT_AMBULATORY_CARE_PROVIDER_SITE_OTHER): Payer: Self-pay

## 2024-11-18 ENCOUNTER — Ambulatory Visit: Admitting: Internal Medicine

## 2024-11-18 ENCOUNTER — Telehealth: Payer: Self-pay

## 2024-11-18 ENCOUNTER — Encounter: Payer: Self-pay | Admitting: Internal Medicine

## 2024-11-18 NOTE — Telephone Encounter (Signed)
 Patient in today for weight check only. His weight is currently 272 lbs.

## 2024-11-18 NOTE — Telephone Encounter (Signed)
 Copied from CRM #[id number]. Topic: General - Billing Inquiry >> Nov 18, 2024  8:56 AM Montie POUR wrote: Reason for CRM:  Mr. Licciardi would like a call back to make sure his new insurance with [employer] is in network. Epic would not let me run insurance. I offered to give him the number to billing department and I also, let him know that he could call his insurance and have them email him a statement that Dr. Tobie is in his network. He prefers to have the clinic to call him at [phone] to discuss.

## 2024-11-18 NOTE — Telephone Encounter (Signed)
 Prior weight was 272, today on recheck is 254 lbs Elijah Shaffer

## 2024-11-18 NOTE — Telephone Encounter (Signed)
 Spoke to patient and sent him the billing # from what front sees it is bcbs out of network.

## 2024-11-18 NOTE — Telephone Encounter (Signed)
 Left a voicemail informing patient to contact office regarding having his weight taken in office for Inspire procedure. Patient stated that he would come in 11/17/24 but he has not yet.

## 2024-11-19 ENCOUNTER — Telehealth (INDEPENDENT_AMBULATORY_CARE_PROVIDER_SITE_OTHER): Payer: Self-pay | Admitting: Otolaryngology

## 2024-11-19 NOTE — Telephone Encounter (Signed)
" °  Otolaryngology Clinic Note HPI:  Elijah Shaffer is a 44 y.o. male referred or evaluation of OSA. He has met all his requirements but insurance required a weight check. Patient was seen in office and his weight on 11/19/2023 was 254 lbs at 6'3 -- BMI 31.75  No other changes in health  Salient findings:  See above Impression & Plans:  Elijah Shaffer is a 44 y.o. male with OSA and CPAP intolerance Weight checked today and BMI 31.75 today. He has passed his DISE and will submit for insurance approval regarding Inspire  Sincerely, Eldora Blanch, MD Otolaryngologist (ENT), [facility] Phone: [phone] Fax: [phone]  11/19/2024, 11:54 AM    "

## 2024-11-23 ENCOUNTER — Encounter: Payer: Self-pay | Admitting: Internal Medicine

## 2024-11-23 ENCOUNTER — Ambulatory Visit (INDEPENDENT_AMBULATORY_CARE_PROVIDER_SITE_OTHER): Admitting: Internal Medicine

## 2024-11-23 VITALS — BP 139/85 | HR 78 | Ht 75.0 in | Wt 276.2 lb

## 2024-11-23 DIAGNOSIS — M25561 Pain in right knee: Secondary | ICD-10-CM | POA: Diagnosis not present

## 2024-11-23 DIAGNOSIS — G5603 Carpal tunnel syndrome, bilateral upper limbs: Secondary | ICD-10-CM | POA: Diagnosis not present

## 2024-11-23 DIAGNOSIS — E1122 Type 2 diabetes mellitus with diabetic chronic kidney disease: Secondary | ICD-10-CM | POA: Diagnosis not present

## 2024-11-23 DIAGNOSIS — Z7984 Long term (current) use of oral hypoglycemic drugs: Secondary | ICD-10-CM | POA: Diagnosis not present

## 2024-11-23 DIAGNOSIS — N1831 Chronic kidney disease, stage 3a: Secondary | ICD-10-CM | POA: Diagnosis not present

## 2024-11-23 DIAGNOSIS — G8929 Other chronic pain: Secondary | ICD-10-CM | POA: Diagnosis not present

## 2024-11-23 DIAGNOSIS — I10 Essential (primary) hypertension: Secondary | ICD-10-CM | POA: Diagnosis not present

## 2024-11-23 DIAGNOSIS — Z7985 Long-term (current) use of injectable non-insulin antidiabetic drugs: Secondary | ICD-10-CM

## 2024-11-23 DIAGNOSIS — E782 Mixed hyperlipidemia: Secondary | ICD-10-CM

## 2024-11-23 MED ORDER — TRULICITY 1.5 MG/0.5ML ~~LOC~~ SOAJ: 1.5000 mg | SUBCUTANEOUS | 3 refills | Status: DC

## 2024-11-23 MED ORDER — CELECOXIB 100 MG PO CAPS: 100.0000 mg | ORAL_CAPSULE | Freq: Every day | ORAL | 3 refills | Status: DC

## 2024-11-23 NOTE — Assessment & Plan Note (Signed)
 On Crestor  and Fenofibrate  Checked lipid profile

## 2024-11-23 NOTE — Assessment & Plan Note (Signed)
 Bilateral wrist pain with hand numbness and tingling likely due to carpal tunnel syndrome Advised to take pyridoxine Continue using wrist brace Referred to orthopedic surgery for further evaluation

## 2024-11-23 NOTE — Assessment & Plan Note (Signed)
 BP Readings from Last 1 Encounters:  11/23/24 139/85   Well-controlled with Amlodipine  5 mg once daily On propranolol  for PSVT and tremors DCed olmesartan due to concern for orthostatic hypotension in the last visit Counseled for compliance with the medications Advised DASH diet and moderate exercise/walking, at least 150 mins/week

## 2024-11-23 NOTE — Patient Instructions (Addendum)
 Please take Celecoxib  as needed for knee pain.  Please start taking Pyridoxine 200 mg once daily for 2 months.  Please continue using wrist brace for wrist pain.  Please start taking Trulicity  1.5 mg once weekly after completing 0.75 mg doses.  Please continue to take medications as prescribed.  Please continue to follow low carb diet and perform moderate exercise/walking at least 150 mins/week.  Please get fasting blood tests done before the next visit.

## 2024-11-23 NOTE — Telephone Encounter (Signed)
 Pharmacy Patient Advocate Encounter  Received notification from [facility] that Prior Authorization for Dexcom G7 Sensor  has been DENIED.  Full denial letter will be uploaded to the media tab. See denial reason below.   PA #/Case ID/Reference #: [id number]

## 2024-11-23 NOTE — Assessment & Plan Note (Signed)
 Lab Results  Component Value Date   HGBA1C 6.8 (H) 08/17/2024   Overall well-controlled On Jardiance  10 mg QD On Trulicity  0.75 mg qw, increased dose to 1.5 mg QW - Mounjaro  was not covered, which would have been preferred considering his OSA Has CGM - has history of hypoglycemia in 50s, blood glucose in range - 85% Advised to follow diabetic diet F/u CMP, HbA1c and lipid panel Diabetic eye exam: Advised to follow up with Ophthalmology for diabetic eye exam  Has CKD stage 3a, followed by Nephrology, needs to improve hydration

## 2024-11-23 NOTE — Progress Notes (Signed)
 "  Acute Office Visit  Subjective:    Patient ID: Elijah Shaffer, male    DOB: November 03, 1981, 44 y.o.   MRN: [id number]  Chief Complaint  Patient presents with   Knee Pain    Would like to have his right knee evaluated, has been having popping for a while but it seems like it has worsened.     Hand Pain    Bilateral hand pain, wears brace to help.     HPI Patient is in today for complaint of acute on chronic right knee pain for the last 2 weeks.  He has noticed severe right knee pain upon bending.  He has heard popping sounds for many years, but reports that pain was not this severe previously.  Denies any local knee swelling currently.  He has tried taking Tylenol  arthritis with mild relief.  Denies any recent injury or fall.  He also reports R > L wrist pain and hand numbness and tingling, worse at nighttime.  He has also noticed worsening of wrist pain when clenching the fist.  He has been using wrist brace, which helps somewhat with the pain.  Type II DM: He has started taking Trulicity  0.75 mg once weekly.  He has gained about 6 lbs since the last visit instead of losing any weight.  He is trying to follow low-carb diet.  He still takes Jardiance  10 mg QD.  Past Medical History:  Diagnosis Date   Abdominal hernia    2025   Acute renal injury 06/28/2017   Anxiety    Aspiration pneumonia of right upper lobe due to gastric secretions (HCC)    Diabetes mellitus without complication (HCC)    Diverticulitis    GERD (gastroesophageal reflux disease) 01/08/2018   Hiatal hernia    2022   Hypertension    Prediabetes    Seasonal allergies    Sleep apnea    Syncope 06/26/2021    Past Surgical History:  Procedure Laterality Date   BIOPSY  03/18/2021   Procedure: BIOPSY;  Surgeon: Eartha Angelia Sieving, MD;  [facility];  Service: Gastroenterology;;  esophageal at Z-line   BIOPSY  05/23/2021   Procedure: BIOPSY;  Surgeon: Eartha Angelia Sieving, MD;  [facility];  Service: Gastroenterology;;   BIOPSY  08/19/2023   Procedure: BIOPSY;  Surgeon: Eartha Angelia Sieving, MD;  [facility];  Service: Gastroenterology;;   CARDIOPULMONARY EXERCISE TEST (CPX)  01/09/2022   Interpretation limited by submaximal effort.  Severe functional impairment when compared to max sedentary norms.  No cardiopulmonary limitations.  Noted tremor and generalized weakness that lead to premature exercise termination-consider neuromuscular evaluation.   COLONOSCOPY N/A 09/22/2017   Procedure: COLONOSCOPY;  Surgeon: Golda Claudis PENNER, MD;  [facility]: [facility];  Service: Endoscopy;  Laterality: N/A;  9:55   COLONOSCOPY WITH PROPOFOL  N/A 05/23/2021   Procedure: COLONOSCOPY WITH PROPOFOL ;  Surgeon: Eartha Angelia Sieving, MD;  [facility];  Service: Gastroenterology;  Laterality: N/A;  7:30   DRUG INDUCED ENDOSCOPY N/A 10/25/2024   Procedure: DRUG INDUCED SLEEP ENDOSCOPY;  Surgeon: Tobie Eldora NOVAK, MD;  Location: [facility];  Service: ENT;  Laterality: N/A;   ESOPHAGOGASTRODUODENOSCOPY (EGD) WITH PROPOFOL  N/A 03/18/2021   Procedure: ESOPHAGOGASTRODUODENOSCOPY (EGD) WITH PROPOFOL ;  Surgeon: Eartha Angelia Sieving, MD;  [facility];  Service: Gastroenterology;  Laterality: N/A;   ESOPHAGOGASTRODUODENOSCOPY (EGD) WITH PROPOFOL  N/A 05/23/2021   Procedure: ESOPHAGOGASTRODUODENOSCOPY (EGD) WITH PROPOFOL ;  Surgeon: Eartha Angelia,  Toribio, MD;  [facility];  Service: Gastroenterology;  Laterality: N/A;   ESOPHAGOGASTRODUODENOSCOPY (EGD) WITH PROPOFOL  N/A 08/19/2023   Procedure: ESOPHAGOGASTRODUODENOSCOPY (EGD) WITH PROPOFOL ;  Surgeon: Eartha Angelia Toribio, MD;  [facility];  Service: Gastroenterology;  Laterality: N/A;  11:45AM;ASA 3   ESOPHAGOGASTRODUODENOSCOPY (EGD) WITH PROPOFOL  N/A 10/08/2023   Procedure: ESOPHAGOGASTRODUODENOSCOPY (EGD) WITH PROPOFOL ;  Surgeon: Eartha Angelia Toribio,  MD;  [facility];  Service: Gastroenterology;  Laterality: N/A;  1:45PM;ASA 1-2   GIVENS CAPSULE STUDY N/A 10/08/2023   Procedure: GIVENS CAPSULE STUDY;  Surgeon: Eartha Angelia Toribio, MD;  [facility];  Service: Gastroenterology;  Laterality: N/A;  1:45PM;ASA 1-2   POLYPECTOMY  09/22/2017   Procedure: POLYPECTOMY;  Surgeon: Golda Claudis PENNER, MD;  [facility];  Service: Endoscopy;;   RIGHT/LEFT HEART CATH AND CORONARY ANGIOGRAPHY N/A 12/12/2021   Procedure: RIGHT/LEFT HEART CATH AND CORONARY ANGIOGRAPHY;  Surgeon: Cherrie Toribio SAUNDERS, MD;  [facility]: [facility];  Service: Cardiovascular:: Normal Coronaries & Hemodynamics.  EF 50-55%. RA = 4 mmHg, RV = 23/4; PA = 22/6 (14) & PCW = 7; Ao sat = 95%; PA sat = 76%, 76%& SVC sat = 74%Fick cardiac output/index = 7.1/3.0; PVR = 1.0 WU   TRANSTHORACIC ECHOCARDIOGRAM  02/24/2021   EF 60 to 65%.  Normal LV function.  Normal valves.  Mild RV dilation with normal function.   ZIO PATCH EVENT MONITOR  01/2022   Sinus rhythm with heart rate range 24 to 159 bpm, 1  AVB and Wenckebach block noted along with rare PACs and PVCs.  No arrhythmias.    Family History  Problem Relation Age of Onset   Healthy Mother    Cervical cancer Mother    Cancer Father    Colon cancer Maternal Grandmother    Colon cancer Maternal Grandfather     Social History   Socioeconomic History   Marital status: Single    Spouse name: Not on file   Number of children: Not on file   Years of education: Not on file   Highest education level: Not on file  Occupational History   Not on file  Tobacco Use   Smoking status: Never    Passive exposure: Past   Smokeless tobacco: Never  Vaping Use   Vaping status: Never Used  Substance and Sexual Activity   Alcohol use: Not Currently    Comment: occasionally   Drug use: No   Sexual activity: Not on file  Other Topics Concern   Not on file  Social History Narrative   He currently  has his own lawn care landscaping service. He previous had 80 yards before he got sick and since decreased. He works around a interior and spatial designer. One of the chemical names is roundup.    Social Drivers of Health   Tobacco Use: Low Risk (11/23/2024)   Patient History    Smoking Tobacco Use: Never    Smokeless Tobacco Use: Never    Passive Exposure: Past  Financial Resource Strain: Not on file  Food Insecurity: No Food Insecurity (06/20/2023)   Hunger Vital Sign    Worried About Running Out of Food in the Last Year: Never true    Ran Out of Food in the Last Year: Never true  Transportation Needs: No Transportation Needs (12/27/2022)   PRAPARE - Administrator, Civil Service (Medical): No    Lack of Transportation (Non-Medical): No  Physical Activity: Insufficiently Active (03/29/2022)  Exercise Vital Sign    Days of Exercise per Week: 2 days    Minutes of Exercise per Session: 10 min  Stress: Stress Concern Present (05/07/2022)   [employer] of Occupational Health - Occupational Stress Questionnaire    Feeling of Stress : To some extent  Social Connections: Not on file  Intimate Partner Violence: Not At Risk (06/20/2023)   Humiliation, Afraid, Rape, and Kick questionnaire    Fear of Current or Ex-Partner: No    Emotionally Abused: No    Physically Abused: No    Sexually Abused: No  Depression (PHQ2-9): Low Risk (11/23/2024)   Depression (PHQ2-9)    PHQ-2 Score: 0  Alcohol Screen: Not on file  Housing: Unknown (11/27/2023)   Received from [facility]   Epic    Unable to Pay for Housing in the Last Year: Not on file    Number of Times Moved in the Last Year: Not on file    At any time in the past 12 months, were you homeless or living in a shelter (including now)?: No  Utilities: Not At Risk (12/27/2022)   [facility] Utilities    Threatened with loss of utilities: No  Health Literacy: Adequate Health Literacy (06/20/2023)   B1300 Health Literacy    Frequency  of need for help with medical instructions: Never    Outpatient Medications Prior to Visit  Medication Sig Dispense Refill   albuterol  (VENTOLIN  HFA) 108 (90 Base) MCG/ACT inhaler Inhale 1-2 puffs into the lungs every 6 (six) hours as needed for wheezing or shortness of breath.     amLODipine  (NORVASC ) 5 MG tablet Take 1 tablet (5 mg total) by mouth daily. 90 tablet 3   b complex vitamins capsule Take 1 capsule by mouth daily. With D3 gummie     Blood Glucose Monitoring Suppl DEVI 1 each by Does not apply route in the morning, at noon, and at bedtime. May substitute to any manufacturer covered by patient's insurance. 1 each 0   cetirizine (ZYRTEC) 10 MG tablet Take 10 mg by mouth daily.     Cholecalciferol (VITAMIN D -3 PO) Take by mouth daily at 6 (six) AM.     clindamycin  (CLEOCIN  T) 1 % lotion APPLY TO BUMPS ONCE DAILY AFTER SHOWER. 60 mL 3   clobetasol  cream (TEMOVATE ) 0.05 % Spot treat more severe areas once to twice daily until improved. Avoid applying to face, groin, and axilla. Use as directed. Long-term use can cause thinning of the skin. 60 g 3   Continuous Glucose Sensor (DEXCOM G7 SENSOR) MISC Use it to check blood glucose 3 times before meals, at bedtime and as needed. 3 each 5   Continuous Glucose Transmitter (DEXCOM G6 TRANSMITTER) MISC Use to check blood sugar daily DX e11.65 1 each 3   cyclobenzaprine  (FLEXERIL ) 5 MG tablet Take 1 tablet (5 mg total) by mouth 3 (three) times daily as needed for muscle spasms. 20 tablet 0   diphenhydrAMINE -zinc  acetate (BENADRYL  EXTRA STRENGTH) cream Apply 1 application topically 3 (three) times daily as needed for itching. 28.4 g 0   docusate sodium  (COLACE) 100 MG capsule Take 100 mg by mouth 2 (two) times daily.     DULoxetine (CYMBALTA) 30 MG capsule Take 30 mg by mouth 2 (two) times daily.     fenofibrate  micronized (LOFIBRA) 134 MG capsule Take 1 capsule (134 mg total) by mouth daily before breakfast. 90 capsule 3   fluticasone  (FLONASE ) 50  MCG/ACT nasal spray Place 1 spray  into both nostrils daily. 100 mL 2   gabapentin (NEURONTIN) 300 MG capsule Take 300 mg by mouth 3 (three) times daily.     Glucose Blood (BLOOD GLUCOSE TEST STRIPS) STRP 1 each by In Vitro route in the morning, at noon, and at bedtime. May substitute to any manufacturer covered by patient's insurance. 100 strip 1   hydrOXYzine  (ATARAX ) 25 MG tablet TAKE 1 TO 2 TABLETS BY MOUTH AT BEDTIME AS NEEDED FOR ITCHING 60 tablet 1   JARDIANCE  10 MG TABS tablet TAKE 1 TABLET BY MOUTH ONCE DAILY BEFORE BREAKFAST 60 tablet 0   ketoconazole  (NIZORAL ) 2 % shampoo Massage into scalp 2-3 times a week, let sit several minutes before rinsing. 120 mL 11   lubiprostone  (AMITIZA ) 24 MCG capsule TAKE 1 CAPSULE BY MOUTH TWICE DAILY WITH A MEAL 180 capsule 0   mometasone  (ELOCON ) 0.1 % cream Apply topically qd/bid for itchy areas at legs as needed for atopic dermatitis 45 g 3   montelukast  (SINGULAIR ) 10 MG tablet TAKE 1 TABLET BY MOUTH AT BEDTIME 90 tablet 0   omeprazole  (PRILOSEC) 40 MG capsule Take 1 capsule by mouth twice daily 180 capsule 0   ondansetron  (ZOFRAN ) 4 MG tablet Take 1 tablet (4 mg total) by mouth every 6 (six) hours as needed for nausea. 20 tablet 0   rosuvastatin  (CRESTOR ) 40 MG tablet Take 1 tablet (40 mg total) by mouth daily. 90 tablet 3   Testosterone  25 MG/2.5GM (1%) GEL Place 5 g onto the skin every morning. 1 packet to each shoulder q am. 150 g 5   triamcinolone  cream (KENALOG ) 0.1 %      Vitamin D , Ergocalciferol , (DRISDOL ) 1.25 MG (50000 UNIT) CAPS capsule Take 1 capsule by mouth once a week 12 capsule 1   TRULICITY  0.75 MG/0.5ML SOAJ INJECT 1 PEN SUBCUTANEOUSLY ONCE A WEEK 4 mL 0   No facility-administered medications prior to visit.    Allergies[1]  Review of Systems  Constitutional:  Positive for fatigue. Negative for chills and fever.  HENT:  Negative for congestion and sore throat.   Eyes:  Negative for pain and discharge.  Respiratory:  Positive  for shortness of breath (Intermittent). Negative for cough.   Cardiovascular:  Positive for palpitations. Negative for chest pain.  Gastrointestinal:  Positive for constipation. Negative for diarrhea, nausea and vomiting.  Endocrine: Negative for polydipsia and polyuria.  Genitourinary:  Negative for dysuria and hematuria.  Musculoskeletal:  Positive for arthralgias (Right knee, bilateral wrist), back pain, myalgias and neck pain. Negative for neck stiffness.  Neurological:  Positive for tremors. Negative for headaches.  Psychiatric/Behavioral:  Positive for sleep disturbance. Negative for agitation and behavioral problems. The patient is nervous/anxious.        Objective:    Physical Exam Vitals reviewed.  Constitutional:      General: He is not in acute distress.    Appearance: He is not diaphoretic.  HENT:     Head: Normocephalic and atraumatic.     Nose: No congestion.     Mouth/Throat:     Mouth: Mucous membranes are moist.  Eyes:     General: No scleral icterus.    Extraocular Movements: Extraocular movements intact.  Cardiovascular:     Rate and Rhythm: Normal rate and regular rhythm.     Heart sounds: Normal heart sounds. No murmur heard. Pulmonary:     Breath sounds: Normal breath sounds. No wheezing or rales.  Musculoskeletal:        General: Tenderness (  Mid thoracic and lumbar spine area) present.     Right wrist: Tenderness present. Normal range of motion.     Left wrist: No tenderness. Normal range of motion.     Cervical back: Neck supple. No tenderness.     Right knee: No swelling. Normal range of motion. Tenderness present over the lateral joint line.     Right lower leg: No edema.     Left lower leg: No edema.     Comments: Positive Phalen sign  Skin:    General: Skin is warm.     Coloration: Skin is not jaundiced.  Neurological:     General: No focal deficit present.     Mental Status: He is alert and oriented to person, place, and time.     Sensory: No  sensory deficit.     Motor: No weakness.     Comments: Functional tremors on right hand  Psychiatric:        Mood and Affect: Mood is anxious.        Behavior: Behavior normal.        Thought Content: Thought content does not include homicidal or suicidal ideation.     BP 139/85   Pulse 78   Ht 6' 3 (1.905 m)   Wt 276 lb 3.2 oz (125.3 kg)   SpO2 96%   BMI 34.52 kg/m  Wt Readings from Last 3 Encounters:  11/23/24 276 lb 3.2 oz (125.3 kg)  10/25/24 269 lb 6.4 oz (122.2 kg)  09/15/24 266 lb 12.8 oz (121 kg)        Assessment & Plan:   Problem List Items Addressed This Visit       Cardiovascular and Mediastinum   Essential hypertension (Chronic)   BP Readings from Last 1 Encounters:  11/23/24 139/85   Well-controlled with Amlodipine  5 mg once daily On propranolol  for PSVT and tremors DCed olmesartan due to concern for orthostatic hypotension in the last visit Counseled for compliance with the medications Advised DASH diet and moderate exercise/walking, at least 150 mins/week      Relevant Orders   CMP14+EGFR   CBC with Differential/Platelet     Endocrine   Type 2 diabetes mellitus with diabetic chronic kidney disease (HCC)   Lab Results  Component Value Date   HGBA1C 6.8 (H) 08/17/2024   Overall well-controlled On Jardiance  10 mg QD On Trulicity  0.75 mg qw, increased dose to 1.5 mg QW - Mounjaro  was not covered, which would have been preferred considering his OSA Has CGM - has history of hypoglycemia in 50s, blood glucose in range - 85% Advised to follow diabetic diet F/u CMP, HbA1c and lipid panel Diabetic eye exam: Advised to follow up with Ophthalmology for diabetic eye exam  Has CKD stage 3a, followed by Nephrology, needs to improve hydration      Relevant Medications   Dulaglutide  (TRULICITY ) 1.5 MG/0.5ML SOAJ   Other Relevant Orders   Microalbumin / creatinine urine ratio   CMP14+EGFR   Hemoglobin A1c     Nervous and Auditory   Bilateral  carpal tunnel syndrome   Bilateral wrist pain with hand numbness and tingling likely due to carpal tunnel syndrome Advised to take pyridoxine Continue using wrist brace Referred to orthopedic surgery for further evaluation      Relevant Orders   Ambulatory referral to Orthopedic Surgery     Other   Mixed hyperlipidemia   On Crestor  and Fenofibrate  Checked lipid profile      Relevant Orders  Lipid Profile   Chronic pain of right knee - Primary   Recent worsening of right knee pain Unclear if related to ligament injury Will defer imaging to orthopedic surgery - may need MRI of knee Celebrex  as needed for pain      Relevant Medications   celecoxib  (CELEBREX ) 100 MG capsule   Other Relevant Orders   Ambulatory referral to Orthopedic Surgery     Meds ordered this encounter  Medications   Dulaglutide  (TRULICITY ) 1.5 MG/0.5ML SOAJ    Sig: Inject 1.5 mg into the skin once a week.    Dispense:  2 mL    Refill:  3   celecoxib  (CELEBREX ) 100 MG capsule    Sig: Take 1 capsule (100 mg total) by mouth daily.    Dispense:  30 capsule    Refill:  3     Elijah Bihl K Boris Engelmann, MD     [1]  Allergies Allergen Reactions   Loratadine-Pseudoephedrine Er Palpitations   Pollen Extract Other (See Comments)    Seasonal allergies   "

## 2024-11-23 NOTE — Assessment & Plan Note (Signed)
 Recent worsening of right knee pain Unclear if related to ligament injury Will defer imaging to orthopedic surgery - may need MRI of knee Celebrex  as needed for pain

## 2024-11-24 ENCOUNTER — Telehealth: Payer: Self-pay | Admitting: Pharmacy Technician

## 2024-11-24 ENCOUNTER — Other Ambulatory Visit (HOSPITAL_COMMUNITY): Payer: Self-pay

## 2024-11-24 NOTE — Telephone Encounter (Signed)
 Pharmacy Patient Advocate Encounter   Received notification from Onbase CMM KEY that prior authorization for Trulicity  1.5MG /0.5ML auto-injectors is required/requested.   Insurance verification completed.   The patient is insured through [facility].   Per test claim: PA required; PA submitted to above mentioned insurance via Latent Key/confirmation #/EOC [id number] Status is pending

## 2024-11-25 LAB — MICROALBUMIN / CREATININE URINE RATIO
Creatinine, Urine: 168.4 mg/dL
Microalb/Creat Ratio: 20 mg/g{creat} (ref 0–29)
Microalbumin, Urine: 33.3 ug/mL

## 2024-11-26 ENCOUNTER — Encounter: Payer: Self-pay | Admitting: Urology

## 2024-11-26 ENCOUNTER — Other Ambulatory Visit (HOSPITAL_COMMUNITY): Payer: Self-pay

## 2024-11-26 ENCOUNTER — Encounter: Payer: Self-pay | Admitting: Internal Medicine

## 2024-11-26 NOTE — Telephone Encounter (Signed)
 Pharmacy Patient Advocate Encounter  Received notification from [facility] that Prior Authorization for Trulicity  1.5MG /0.5ML auto-injectors has been APPROVED from 11/26/2024 to 11/24/2025. Ran test claim, Copay is $137.07. This test claim was processed through [facility]- copay amounts may vary at other pharmacies due to pharmacy/plan contracts, or as the patient moves through the different stages of their insurance plan.   PA #/Case ID/Reference #: [id number]

## 2024-11-29 ENCOUNTER — Telehealth: Payer: Self-pay

## 2024-11-29 NOTE — Telephone Encounter (Signed)
 Duplicate message , please advice?

## 2024-11-29 NOTE — Telephone Encounter (Signed)
 Pals get alternative to testing supply , trulicity  and jardiance  that is covered by his pharmacy, ask pharmaciust for help with this please

## 2024-11-29 NOTE — Telephone Encounter (Signed)
 Copied from CRM [id number]. Topic: Clinical - Prescription Issue >> Nov 29, 2024 10:10 AM Charolett L wrote: Reason for CRM: Patient stated that he needs an alternative medication for medications listed below due to his insurance not covering most of the cost and he can't afford it..  Continuous Glucose Sensor (DEXCOM G7 SENSOR) MISC Dulaglutide  (TRULICITY ) 1.5 MG/0.5ML SOAJ JARDIANCE  10 MG TABS tablet

## 2024-11-30 ENCOUNTER — Other Ambulatory Visit: Payer: Self-pay | Admitting: Internal Medicine

## 2024-11-30 DIAGNOSIS — E1122 Type 2 diabetes mellitus with diabetic chronic kidney disease: Secondary | ICD-10-CM

## 2024-11-30 NOTE — Telephone Encounter (Signed)
 Due to insurance patient requesting a different Rx please read below for details

## 2024-12-03 ENCOUNTER — Telehealth: Payer: Self-pay

## 2024-12-03 NOTE — Telephone Encounter (Signed)
 Copied from CRM #[id number]. Topic: Clinical - Prescription Issue >> Dec 02, 2024  5:34 PM Kevelyn M wrote: Reason for CRM: patient calling because he wants medication that the insurance will pay for it for the in place Jardiance , and Trulicity . He said [facility] pharmacy stated that they could not do anything.  Call back # [phone]

## 2024-12-06 ENCOUNTER — Other Ambulatory Visit (HOSPITAL_COMMUNITY): Payer: Self-pay

## 2024-12-06 ENCOUNTER — Telehealth: Payer: Self-pay | Admitting: Pharmacy Technician

## 2024-12-06 ENCOUNTER — Encounter (INDEPENDENT_AMBULATORY_CARE_PROVIDER_SITE_OTHER): Payer: Self-pay

## 2024-12-06 NOTE — Telephone Encounter (Signed)
" ° ° °  Changed to fenoibrate 134mcg  "

## 2024-12-07 ENCOUNTER — Ambulatory Visit (INDEPENDENT_AMBULATORY_CARE_PROVIDER_SITE_OTHER): Admitting: Gastroenterology

## 2024-12-07 ENCOUNTER — Telehealth: Payer: Self-pay | Admitting: Internal Medicine

## 2024-12-07 ENCOUNTER — Encounter (INDEPENDENT_AMBULATORY_CARE_PROVIDER_SITE_OTHER): Payer: Self-pay | Admitting: Gastroenterology

## 2024-12-07 VITALS — BP 132/83 | HR 72 | Temp 97.8°F | Ht 75.0 in | Wt 272.8 lb

## 2024-12-07 DIAGNOSIS — K581 Irritable bowel syndrome with constipation: Secondary | ICD-10-CM | POA: Diagnosis not present

## 2024-12-07 DIAGNOSIS — K7581 Nonalcoholic steatohepatitis (NASH): Secondary | ICD-10-CM

## 2024-12-07 DIAGNOSIS — K219 Gastro-esophageal reflux disease without esophagitis: Secondary | ICD-10-CM

## 2024-12-07 MED ORDER — OMEPRAZOLE 40 MG PO CPDR: 40.0000 mg | DELAYED_RELEASE_CAPSULE | Freq: Two times a day (BID) | ORAL | 3 refills | Status: DC

## 2024-12-07 NOTE — Patient Instructions (Signed)
-  We will continue omeprazole  40mg  twice daily for reflux, I am providing a goodrx card, if this is still too expensive, please let me know -We will continue amitiza  twice daily I am providing the mediterranean diet, this is a guideline to help you know which foods you should eat and those you should try to limit or avoid. It is important to make sure you are getting atleast 30 minutes of exercise 4-5x/week You should aim for 5-7% decrease in overall weight. Fatty liver is usually well managed with dietary and lifestyle modifications, however, in rare cases, this can progress to fibrosis/cirrhosis of the liver, which is serious.  Please avoid alcohol as this can worsen liver issues. -we will recheck liver enzymes again in about 3 months   Follow up 6 months  It was a pleasure to see you today. I want to create trusting relationships with patients and provide genuine, compassionate, and quality care. I truly value your feedback! please be on the lookout for a survey regarding your visit with me today. I appreciate your input about our visit and your time in completing this!    Lagretta Loseke L. Renly Roots, MSN, APRN, AGNP-C Adult-Gerontology Nurse Practitioner [facility] at [facility]

## 2024-12-07 NOTE — Progress Notes (Addendum)
 "  Referring Provider: Tobie Suzzane POUR, MD Primary Care Physician:  Tobie Suzzane POUR, MD Primary GI Physician: previously Dr. Eartha (Dr. Cinderella)   Chief Complaint  Patient presents with   Follow-up    Patient here today for a follow up. Patient says he has occasional issues with passing gas,and bloating but overall he is doing well.    HPI:   Elijah Shaffer is a 44 y.o. male with past medical history of GERD complicated by long segment nondysplastic Barrett's esophagus, history of diverticulitis and anxiety,  OSA, large hiatal hernia, polyneuropathy, IBS-C   Patient presenting today for:  Follow up of bloating  GERD MASH IBS with constipation   Last seen July 2025, at that time reported a lesion near umbilicus a few days prior, some bloating, 1-2 Bms per day on amitiza  24mcg BID. Has upcoming appt with gen surg regarding HH repair. Having more GERD symptoms belching, on prilosec 40mg  BID  Recommended discuss umbilical hernia with gen surg, phazyme or beano, continue omeprazole  40mg  BID, continue amitiza  24mcg BID, sucrase breath test, good weight management, mediterranean diet   Labs in November with creat 1.36 AST 45, ALT 56  Hgb 16.4 plt count 245k   Present: Doing well on amitiza  24mcg BID, having 1-2 BMs per day. He feels bloating  has improved some as he is passing more flatus. GERD is well controlled on prilosec BID. Saw general surgery for [facility] repair but he has put that off until next winter. He is about to have a procedure for sleep apnea next month. Had labs done with PCP earlier today. Not following any specific diet. No red flag symptoms. Patient denies melena, hematochezia, nausea, vomiting, diarrhea, constipation, dysphagia, odyonophagia, early satiety or weight loss.   MASH workup: Serologies in September 2024 were negative, to include A1a, ANA, ASMA, Ceruloplasmin, iron panel, AMA US  complete with elastography 07/2023: Diffuse hepatic steatosis. No overt morphologic  changes of cirrhosis. 2. Trace perihepatic ascites. 3. Gallbladder sludge. Median kPa:  2.5  SIBO testing in dec 2024 was negative  Ultrasound liver elastography in September with median K PA 2.5, diffuse hepatic steatosis no overt changes of cirrhosis, trace perihepatic ascites, gallbladder sludge Patient underwent CT abdomen pelvis with contrast x 2 in September 2024 which again showed diverticulosis, hepatic steatosis, sludge or stone in gallbladder neck, no cholecystitis or biliary ductal dilatation, large hiatal hernia. upper GI series on 12/4 with normal esophagus, normal esophageal motility, no spontaneous GERD moderate to large sized type III paraesophageal hernia, duodenal tiny duodenal diverticulum Last Colonoscopy:05/2021 -  The perianal and digital rectal examinations were normal. Multiple large-mouthed diverticula were found in the sigmoid colon, transverse colon, ascending colon and cecum. Non-bleeding internal hemorrhoids were found during retroflexion. The  hemorrhoids were small.  Recommended to repeat in 5 years due to family history of cancer.  Last Endoscopy:October- Esophageal mucosal changes classified as Barrett's stage C6-M6 per Prague criteria. - 8 cm paraesophageal hernia.- Duodenal diverticulum.   Filed Weights   12/07/24 1131  Weight: 272 lb 12.8 oz (123.7 kg)     Past Medical History:  Diagnosis Date   Abdominal hernia    2025   Acute renal injury 06/28/2017   Anxiety    Aspiration pneumonia of right upper lobe due to gastric secretions (HCC)    Diabetes mellitus without complication (HCC)    Diverticulitis    GERD (gastroesophageal reflux disease) 01/08/2018   Hiatal hernia    2022   Hypertension  Prediabetes    Seasonal allergies    Sleep apnea    Syncope 06/26/2021    Past Surgical History:  Procedure Laterality Date   BIOPSY  03/18/2021   Procedure: BIOPSY;  Surgeon: Eartha Angelia Sieving, MD;  [facility]: [facility];  Service:  Gastroenterology;;  esophageal at Z-line   BIOPSY  05/23/2021   Procedure: BIOPSY;  Surgeon: Eartha Angelia Sieving, MD;  [facility]: [facility];  Service: Gastroenterology;;   BIOPSY  08/19/2023   Procedure: BIOPSY;  Surgeon: Eartha Angelia Sieving, MD;  [facility]: [facility];  Service: Gastroenterology;;   CARDIOPULMONARY EXERCISE TEST (CPX)  01/09/2022   Interpretation limited by submaximal effort.  Severe functional impairment when compared to max sedentary norms.  No cardiopulmonary limitations.  Noted tremor and generalized weakness that lead to premature exercise termination-consider neuromuscular evaluation.   COLONOSCOPY N/A 09/22/2017   Procedure: COLONOSCOPY;  Surgeon: Golda Claudis PENNER, MD;  [facility]: [facility];  Service: Endoscopy;  Laterality: N/A;  9:55   COLONOSCOPY WITH PROPOFOL  N/A 05/23/2021   Procedure: COLONOSCOPY WITH PROPOFOL ;  Surgeon: Eartha Angelia Sieving, MD;  [facility]: [facility];  Service: Gastroenterology;  Laterality: N/A;  7:30   DRUG INDUCED ENDOSCOPY N/A 10/25/2024   Procedure: DRUG INDUCED SLEEP ENDOSCOPY;  Surgeon: Tobie Eldora NOVAK, MD;  Location: [facility];  Service: ENT;  Laterality: N/A;   ESOPHAGOGASTRODUODENOSCOPY (EGD) WITH PROPOFOL  N/A 03/18/2021   Procedure: ESOPHAGOGASTRODUODENOSCOPY (EGD) WITH PROPOFOL ;  Surgeon: Eartha Angelia Sieving, MD;  [facility]: [facility];  Service: Gastroenterology;  Laterality: N/A;   ESOPHAGOGASTRODUODENOSCOPY (EGD) WITH PROPOFOL  N/A 05/23/2021   Procedure: ESOPHAGOGASTRODUODENOSCOPY (EGD) WITH PROPOFOL ;  Surgeon: Eartha Angelia Sieving, MD;  [facility];  Service: Gastroenterology;  Laterality: N/A;   ESOPHAGOGASTRODUODENOSCOPY (EGD) WITH PROPOFOL  N/A 08/19/2023   Procedure: ESOPHAGOGASTRODUODENOSCOPY (EGD) WITH PROPOFOL ;  Surgeon: Eartha Angelia Sieving, MD;  [facility];  Service: Gastroenterology;  Laterality: N/A;  11:45AM;ASA 3    ESOPHAGOGASTRODUODENOSCOPY (EGD) WITH PROPOFOL  N/A 10/08/2023   Procedure: ESOPHAGOGASTRODUODENOSCOPY (EGD) WITH PROPOFOL ;  Surgeon: Eartha Angelia Sieving, MD;  [facility];  Service: Gastroenterology;  Laterality: N/A;  1:45PM;ASA 1-2   GIVENS CAPSULE STUDY N/A 10/08/2023   Procedure: GIVENS CAPSULE STUDY;  Surgeon: Eartha Angelia Sieving, MD;  [facility]: [facility];  Service: Gastroenterology;  Laterality: N/A;  1:45PM;ASA 1-2   POLYPECTOMY  09/22/2017   Procedure: POLYPECTOMY;  Surgeon: Golda Claudis PENNER, MD;  [facility]: [facility];  Service: Endoscopy;;   RIGHT/LEFT HEART CATH AND CORONARY ANGIOGRAPHY N/A 12/12/2021   Procedure: RIGHT/LEFT HEART CATH AND CORONARY ANGIOGRAPHY;  Surgeon: Cherrie Sieving SAUNDERS, MD;  Location: [facility];  Service: Cardiovascular:: Normal Coronaries & Hemodynamics.  EF 50-55%. RA = 4 mmHg, RV = 23/4; PA = 22/6 (14) & PCW = 7; Ao sat = 95%; PA sat = 76%, 76%& SVC sat = 74%Fick cardiac output/index = 7.1/3.0; PVR = 1.0 WU   TRANSTHORACIC ECHOCARDIOGRAM  02/24/2021   EF 60 to 65%.  Normal LV function.  Normal valves.  Mild RV dilation with normal function.   ZIO PATCH EVENT MONITOR  01/2022   Sinus rhythm with heart rate range 24 to 159 bpm, 1  AVB and Wenckebach block noted along with rare PACs and PVCs.  No arrhythmias.    Current Outpatient Medications  Medication Sig Dispense Refill   albuterol  (VENTOLIN  HFA) 108 (90 Base) MCG/ACT inhaler Inhale 1-2 puffs into the lungs every 6 (six) hours as needed for wheezing  or shortness of breath.     amLODipine  (NORVASC ) 5 MG tablet Take 1 tablet (5 mg total) by mouth daily. 90 tablet 3   b complex vitamins capsule Take 1 capsule by mouth daily. With D3 gummie     Blood Glucose Monitoring Suppl DEVI 1 each by Does not apply route in the morning, at noon, and at bedtime. May substitute to any manufacturer covered by patient's insurance. 1 each 0   celecoxib  (CELEBREX ) 100 MG capsule Take 1  capsule (100 mg total) by mouth daily. 30 capsule 3   cetirizine (ZYRTEC) 10 MG tablet Take 10 mg by mouth daily.     Cholecalciferol (VITAMIN D -3 PO) Take by mouth daily at 6 (six) AM.     clindamycin  (CLEOCIN  T) 1 % lotion APPLY TO BUMPS ONCE DAILY AFTER SHOWER. (Patient taking differently: as needed. APPLY TO BUMPS ONCE DAILY AFTER SHOWER.) 60 mL 3   clobetasol  cream (TEMOVATE ) 0.05 % Spot treat more severe areas once to twice daily until improved. Avoid applying to face, groin, and axilla. Use as directed. Long-term use can cause thinning of the skin. (Patient taking differently: as needed. Spot treat more severe areas once to twice daily until improved. Avoid applying to face, groin, and axilla. Use as directed. Long-term use can cause thinning of the skin.) 60 g 3   cyclobenzaprine  (FLEXERIL ) 5 MG tablet Take 1 tablet (5 mg total) by mouth 3 (three) times daily as needed for muscle spasms. 20 tablet 0   diphenhydrAMINE -zinc  acetate (BENADRYL  EXTRA STRENGTH) cream Apply 1 application topically 3 (three) times daily as needed for itching. 28.4 g 0   docusate sodium  (COLACE) 100 MG capsule Take 100 mg by mouth 2 (two) times daily.     DULoxetine  (CYMBALTA ) 30 MG capsule Take 30 mg by mouth 2 (two) times daily.     fenofibrate  micronized (LOFIBRA) 134 MG capsule Take 1 capsule (134 mg total) by mouth daily before breakfast. 90 capsule 3   fluticasone  (FLONASE ) 50 MCG/ACT nasal spray Place 1 spray into both nostrils daily. 100 mL 2   gabapentin  (NEURONTIN ) 300 MG capsule Take 300 mg by mouth 3 (three) times daily.     Glucose Blood (BLOOD GLUCOSE TEST STRIPS) STRP 1 each by In Vitro route in the morning, at noon, and at bedtime. May substitute to any manufacturer covered by patient's insurance. 100 strip 1   hydrOXYzine  (ATARAX ) 25 MG tablet TAKE 1 TO 2 TABLETS BY MOUTH AT BEDTIME AS NEEDED FOR ITCHING 60 tablet 1   JARDIANCE  10 MG TABS tablet TAKE 1 TABLET BY MOUTH ONCE DAILY BEFORE BREAKFAST 60  tablet 0   ketoconazole  (NIZORAL ) 2 % shampoo Massage into scalp 2-3 times a week, let sit several minutes before rinsing. 120 mL 11   lubiprostone  (AMITIZA ) 24 MCG capsule TAKE 1 CAPSULE BY MOUTH TWICE DAILY WITH A MEAL 180 capsule 0   mometasone  (ELOCON ) 0.1 % cream Apply topically qd/bid for itchy areas at legs as needed for atopic dermatitis 45 g 3   montelukast  (SINGULAIR ) 10 MG tablet TAKE 1 TABLET BY MOUTH AT BEDTIME 90 tablet 0   omeprazole  (PRILOSEC) 40 MG capsule Take 1 capsule by mouth twice daily 180 capsule 0   ondansetron  (ZOFRAN ) 4 MG tablet Take 1 tablet (4 mg total) by mouth every 6 (six) hours as needed for nausea. 20 tablet 0   rosuvastatin  (CRESTOR ) 40 MG tablet Take 1 tablet (40 mg total) by mouth daily. 90 tablet 3  Testosterone  25 MG/2.5GM (1%) GEL Place 5 g onto the skin every morning. 1 packet to each shoulder q am. 150 g 5   triamcinolone  cream (KENALOG ) 0.1 %      Vitamin D , Ergocalciferol , (DRISDOL ) 1.25 MG (50000 UNIT) CAPS capsule Take 1 capsule by mouth once a week 12 capsule 1   Continuous Glucose Sensor (DEXCOM G7 SENSOR) MISC Use it to check blood glucose 3 times before meals, at bedtime and as needed. (Patient not taking: Reported on 12/07/2024) 3 each 5   Continuous Glucose Transmitter (DEXCOM G6 TRANSMITTER) MISC Use to check blood sugar daily DX e11.65 (Patient not taking: Reported on 12/07/2024) 1 each 3   Dulaglutide  (TRULICITY ) 1.5 MG/0.5ML SOAJ Inject 1.5 mg into the skin once a week. (Patient not taking: Reported on 12/07/2024) 2 mL 3   No current facility-administered medications for this visit.    Allergies as of 12/07/2024 - Review Complete 12/07/2024  Allergen Reaction Noted   Loratadine-pseudoephedrine er Palpitations 01/30/2022   Pollen extract Other (See Comments) 06/26/2021    Social History   Socioeconomic History   Marital status: Single    Spouse name: Not on file   Number of children: Not on file   Years of education: Not on file    Highest education level: Not on file  Occupational History   Not on file  Tobacco Use   Smoking status: Never    Passive exposure: Past   Smokeless tobacco: Never  Vaping Use   Vaping status: Never Used  Substance and Sexual Activity   Alcohol use: Not Currently    Comment: occasionally   Drug use: No   Sexual activity: Not on file  Other Topics Concern   Not on file  Social History Narrative   He currently has his own lawn care landscaping service. He previous had 80 yards before he got sick and since decreased. He works around a interior and spatial designer. One of the chemical names is roundup.    Social Drivers of Health   Tobacco Use: Low Risk (12/07/2024)   Patient History    Smoking Tobacco Use: Never    Smokeless Tobacco Use: Never    Passive Exposure: Past  Financial Resource Strain: Not on file  Food Insecurity: No Food Insecurity (06/20/2023)   Hunger Vital Sign    Worried About Running Out of Food in the Last Year: Never true    Ran Out of Food in the Last Year: Never true  Transportation Needs: No Transportation Needs (12/27/2022)   PRAPARE - Administrator, Civil Service (Medical): No    Lack of Transportation (Non-Medical): No  Physical Activity: Insufficiently Active (03/29/2022)   Exercise Vital Sign    Days of Exercise per Week: 2 days    Minutes of Exercise per Session: 10 min  Stress: Stress Concern Present (05/07/2022)   [employer] of Occupational Health - Occupational Stress Questionnaire    Feeling of Stress : To some extent  Social Connections: Not on file  Depression (PHQ2-9): Low Risk (11/23/2024)   Depression (PHQ2-9)    PHQ-2 Score: 0  Alcohol Screen: Not on file  Housing: Unknown (11/27/2023)   Received from [facility]   Epic    Unable to Pay for Housing in the Last Year: Not on file    Number of Times Moved in the Last Year: Not on file    At any time in the past 12 months, were you homeless or living in a  shelter  (including now)?: No  Utilities: Not At Risk (12/27/2022)   [facility] Utilities    Threatened with loss of utilities: No  Health Literacy: Adequate Health Literacy (06/20/2023)   B1300 Health Literacy    Frequency of need for help with medical instructions: Never    Review of systems General: negative for malaise, night sweats, fever, chills, weight loss Neck: Negative for lumps, goiter, pain and significant neck swelling Resp: Negative for cough, wheezing, dyspnea at rest CV: Negative for chest pain, leg swelling, palpitations, orthopnea GI: denies melena, hematochezia, nausea, vomiting, diarrhea, constipation, dysphagia, odyonophagia, early satiety or unintentional weight loss. +flatulence  MSK: Negative for joint pain or swelling, back pain, and muscle pain. Derm: Negative for itching or rash Psych: Denies depression, anxiety, memory loss, confusion. No homicidal or suicidal ideation.  Heme: Negative for prolonged bleeding, bruising easily, and swollen nodes. Endocrine: Negative for cold or heat intolerance, polyuria, polydipsia and goiter. Neuro: negative for tremor, gait imbalance, syncope and seizures. The remainder of the review of systems is noncontributory.  Physical Exam: BP 132/83 (BP Location: Left Arm, Patient Position: Sitting, Cuff Size: Large)   Pulse 72   Temp 97.8 F (36.6 C) (Temporal)   Ht 6' 3 (1.905 m)   Wt 272 lb 12.8 oz (123.7 kg)   BMI 34.10 kg/m  General:   Alert and oriented. No distress noted. Pleasant and cooperative.  Head:  Normocephalic and atraumatic. Eyes:  Conjuctiva clear without scleral icterus. Mouth:  Oral mucosa pink and moist. Good dentition. No lesions. Heart: Normal rate and rhythm, s1 and s2 heart sounds present.  Lungs: Clear lung sounds in all lobes. Respirations equal and unlabored. Abdomen:  +BS, soft, non-tender and non-distended. No rebound or guarding. No HSM or masses noted. Derm: No palmar erythema or jaundice Msk:  Symmetrical  without gross deformities. Normal posture. Extremities:  Without edema. Neurologic:  Alert and  oriented x4 Psych:  Alert and cooperative. Normal mood and affect.  Invalid input(s): 6 MONTHS   ASSESSMENT: Elijah Shaffer is a 44 y.o. male presenting today for follow up of IBS with constipation, GERD and MASH  IBS with constipation: well managed on amitiza  24mcg BID. having regular BMs, little abdominal pain. Will continue with current Bowel regimen at this time   GERD: doing well on omeprazole  40mg  BID. Reports he has a new insurance and unsure if this will still be covered. Does not appears it is covered under current plan, we discussed switching PPI therapy or trying to use Goodrx card. He opted for GOODrx card though I made him aware if he is unable to obtain the omeprazole  at a reasonable price to let me know and we can change his therapy to something that is covered.   MASH: Elastography as above, most recent LFTs in November with mild elevation, platelet count normal. He had CMP earlier with PCP not yet resulted. He need to continue with good weight management, routine exercise. Will repeat LFTs along with ELF in 3 months    PLAN:  -omeprazole  40mg  BID--goodrx card, will change PPI if not affordable  -mediterranean diet -continue amitiza  24mcg BID -repeat LFTs/ELF in 3 months  -routine physical activity   All questions were answered, patient verbalized understanding and is in agreement with plan as outlined above.   Follow Up: 6 months   Palmer Fahrner L. Cambrey Lupi, MSN, APRN, AGNP-C Adult-Gerontology Nurse Practitioner [facility]  I have reviewed the note and agree with the APP's assessment as  described in this note  Given continued complication from large hiatal hernia with upper GI bleed from [facility] lesions, after optimizing PPI with omeprazole  twice daily; would recommend surgical evaluation for possible hiatal hernia repair  Muhammad Faizan Ahmed,  MD Gastroenterology and Hepatology [facility]astroenterology  "

## 2024-12-07 NOTE — Telephone Encounter (Signed)
 Patient would like to know if dr can call in generic versions of his medications because his new ins only covers a percemtage and cost is too high

## 2024-12-08 ENCOUNTER — Observation Stay (HOSPITAL_COMMUNITY)
Admission: EM | Admit: 2024-12-08 | Discharge: 2024-12-09 | Disposition: A | Discharge status: Home / Self Care | Attending: Family Medicine | Admitting: Family Medicine

## 2024-12-08 ENCOUNTER — Observation Stay (HOSPITAL_COMMUNITY): Admitting: Anesthesiology

## 2024-12-08 ENCOUNTER — Encounter (HOSPITAL_COMMUNITY): Payer: Self-pay

## 2024-12-08 ENCOUNTER — Other Ambulatory Visit: Payer: Self-pay

## 2024-12-08 ENCOUNTER — Encounter (HOSPITAL_COMMUNITY): Admission: EM | Disposition: A | Payer: Self-pay | Source: Home / Self Care | Attending: Emergency Medicine

## 2024-12-08 DIAGNOSIS — E875 Hyperkalemia: Secondary | ICD-10-CM | POA: Diagnosis not present

## 2024-12-08 DIAGNOSIS — E1140 Type 2 diabetes mellitus with diabetic neuropathy, unspecified: Secondary | ICD-10-CM | POA: Diagnosis not present

## 2024-12-08 DIAGNOSIS — I951 Orthostatic hypotension: Secondary | ICD-10-CM | POA: Insufficient documentation

## 2024-12-08 DIAGNOSIS — I471 Supraventricular tachycardia, unspecified: Secondary | ICD-10-CM | POA: Insufficient documentation

## 2024-12-08 DIAGNOSIS — K2270 Barrett's esophagus without dysplasia: Secondary | ICD-10-CM | POA: Insufficient documentation

## 2024-12-08 DIAGNOSIS — I129 Hypertensive chronic kidney disease with stage 1 through stage 4 chronic kidney disease, or unspecified chronic kidney disease: Secondary | ICD-10-CM | POA: Insufficient documentation

## 2024-12-08 DIAGNOSIS — K920 Hematemesis: Secondary | ICD-10-CM | POA: Diagnosis present

## 2024-12-08 DIAGNOSIS — R195 Other fecal abnormalities: Secondary | ICD-10-CM | POA: Diagnosis present

## 2024-12-08 DIAGNOSIS — R5382 Chronic fatigue, unspecified: Secondary | ICD-10-CM | POA: Diagnosis present

## 2024-12-08 DIAGNOSIS — E02 Subclinical iodine-deficiency hypothyroidism: Secondary | ICD-10-CM | POA: Diagnosis not present

## 2024-12-08 DIAGNOSIS — E559 Vitamin D deficiency, unspecified: Secondary | ICD-10-CM | POA: Insufficient documentation

## 2024-12-08 DIAGNOSIS — N1831 Chronic kidney disease, stage 3a: Secondary | ICD-10-CM | POA: Insufficient documentation

## 2024-12-08 DIAGNOSIS — G4733 Obstructive sleep apnea (adult) (pediatric): Secondary | ICD-10-CM | POA: Insufficient documentation

## 2024-12-08 DIAGNOSIS — I8500 Esophageal varices without bleeding: Secondary | ICD-10-CM | POA: Diagnosis not present

## 2024-12-08 DIAGNOSIS — K259 Gastric ulcer, unspecified as acute or chronic, without hemorrhage or perforation: Secondary | ICD-10-CM | POA: Diagnosis not present

## 2024-12-08 DIAGNOSIS — K7581 Nonalcoholic steatohepatitis (NASH): Secondary | ICD-10-CM | POA: Diagnosis not present

## 2024-12-08 DIAGNOSIS — D750 Familial erythrocytosis: Secondary | ICD-10-CM | POA: Insufficient documentation

## 2024-12-08 DIAGNOSIS — K3189 Other diseases of stomach and duodenum: Secondary | ICD-10-CM

## 2024-12-08 DIAGNOSIS — G629 Polyneuropathy, unspecified: Secondary | ICD-10-CM

## 2024-12-08 DIAGNOSIS — D751 Secondary polycythemia: Secondary | ICD-10-CM | POA: Diagnosis present

## 2024-12-08 DIAGNOSIS — Z7985 Long-term (current) use of injectable non-insulin antidiabetic drugs: Secondary | ICD-10-CM | POA: Insufficient documentation

## 2024-12-08 DIAGNOSIS — Z79899 Other long term (current) drug therapy: Secondary | ICD-10-CM | POA: Insufficient documentation

## 2024-12-08 DIAGNOSIS — K219 Gastro-esophageal reflux disease without esophagitis: Secondary | ICD-10-CM | POA: Diagnosis not present

## 2024-12-08 DIAGNOSIS — E1122 Type 2 diabetes mellitus with diabetic chronic kidney disease: Secondary | ICD-10-CM | POA: Insufficient documentation

## 2024-12-08 DIAGNOSIS — Z860100 Personal history of colon polyps, unspecified: Secondary | ICD-10-CM

## 2024-12-08 DIAGNOSIS — K766 Portal hypertension: Secondary | ICD-10-CM

## 2024-12-08 DIAGNOSIS — E782 Mixed hyperlipidemia: Secondary | ICD-10-CM | POA: Diagnosis not present

## 2024-12-08 DIAGNOSIS — E038 Other specified hypothyroidism: Secondary | ICD-10-CM | POA: Diagnosis present

## 2024-12-08 DIAGNOSIS — R42 Dizziness and giddiness: Secondary | ICD-10-CM

## 2024-12-08 DIAGNOSIS — K760 Fatty (change of) liver, not elsewhere classified: Secondary | ICD-10-CM

## 2024-12-08 DIAGNOSIS — F419 Anxiety disorder, unspecified: Secondary | ICD-10-CM | POA: Diagnosis present

## 2024-12-08 DIAGNOSIS — I10 Essential (primary) hypertension: Secondary | ICD-10-CM | POA: Diagnosis present

## 2024-12-08 DIAGNOSIS — K921 Melena: Secondary | ICD-10-CM | POA: Diagnosis not present

## 2024-12-08 DIAGNOSIS — K449 Diaphragmatic hernia without obstruction or gangrene: Secondary | ICD-10-CM | POA: Diagnosis not present

## 2024-12-08 LAB — CBC WITH DIFFERENTIAL/PLATELET
Abs Immature Granulocytes: 0.09 10*3/uL — ABNORMAL HIGH (ref 0.00–0.07)
Basophils Absolute: 0.1 10*3/uL (ref 0.0–0.2)
Basophils Absolute: 0.1 10*3/uL (ref 0.0–0.1)
Basophils Relative: 1 %
Basos: 1 %
EOS (ABSOLUTE): 0.1 10*3/uL (ref 0.0–0.4)
Eos: 1 %
Eosinophils Absolute: 0.1 10*3/uL (ref 0.0–0.5)
Eosinophils Relative: 1 %
HCT: 53.2 % — ABNORMAL HIGH (ref 39.0–52.0)
Hematocrit: 54.9 % — ABNORMAL HIGH (ref 37.5–51.0)
Hemoglobin: 18.2 g/dL — ABNORMAL HIGH (ref 13.0–17.7)
Hemoglobin: 17.4 g/dL — ABNORMAL HIGH (ref 13.0–17.0)
Immature Grans (Abs): 0 10*3/uL (ref 0.0–0.1)
Immature Granulocytes: 0 %
Immature Granulocytes: 1 %
Lymphocytes Absolute: 3.3 10*3/uL — ABNORMAL HIGH (ref 0.7–3.1)
Lymphocytes Relative: 26 %
Lymphs Abs: 2.6 10*3/uL (ref 0.7–4.0)
Lymphs: 40 %
MCH: 29.4 pg (ref 26.6–33.0)
MCH: 28.8 pg (ref 26.0–34.0)
MCHC: 33.2 g/dL (ref 31.5–35.7)
MCHC: 32.7 g/dL (ref 30.0–36.0)
MCV: 89 fL (ref 79–97)
MCV: 88.1 fL (ref 80.0–100.0)
Monocytes Absolute: 0.7 10*3/uL (ref 0.1–0.9)
Monocytes Absolute: 0.7 10*3/uL (ref 0.1–1.0)
Monocytes Relative: 7 %
Monocytes: 8 %
Neutro Abs: 6.6 10*3/uL (ref 1.7–7.7)
Neutrophils Absolute: 4.1 10*3/uL (ref 1.4–7.0)
Neutrophils Relative %: 64 %
Neutrophils: 49 %
Platelets: 264 10*3/uL (ref 150–450)
Platelets: 278 10*3/uL (ref 150–400)
RBC: 6.19 x10E6/uL — ABNORMAL HIGH (ref 4.14–5.80)
RBC: 6.04 MIL/uL — ABNORMAL HIGH (ref 4.22–5.81)
RDW: 13.3 % (ref 11.6–15.4)
RDW: 13.5 % (ref 11.5–15.5)
WBC: 8.3 10*3/uL (ref 3.4–10.8)
WBC: 10 10*3/uL (ref 4.0–10.5)
nRBC: 0 % (ref 0.0–0.2)

## 2024-12-08 LAB — COMPREHENSIVE METABOLIC PANEL WITH GFR
ALT: 45 U/L — ABNORMAL HIGH (ref 0–44)
AST: 33 U/L (ref 15–41)
Albumin: 5.1 g/dL — ABNORMAL HIGH (ref 3.5–5.0)
Alkaline Phosphatase: 76 U/L (ref 38–126)
Anion gap: 8 (ref 5–15)
BUN: 35 mg/dL — ABNORMAL HIGH (ref 6–20)
CO2: 27 mmol/L (ref 22–32)
Calcium: 9.8 mg/dL (ref 8.9–10.3)
Chloride: 104 mmol/L (ref 98–111)
Creatinine, Ser: 1.4 mg/dL — ABNORMAL HIGH (ref 0.61–1.24)
GFR, Estimated: >60 mL/min
Glucose, Bld: 176 mg/dL — ABNORMAL HIGH (ref 70–99)
Potassium: 6.5 mmol/L (ref 3.5–5.1)
Sodium: 139 mmol/L (ref 135–145)
Total Bilirubin: 0.9 mg/dL (ref 0.0–1.2)
Total Protein: 7.8 g/dL (ref 6.5–8.1)

## 2024-12-08 LAB — LIPID PANEL
Chol/HDL Ratio: 3.6 ratio (ref 0.0–5.0)
Cholesterol, Total: 119 mg/dL (ref 100–199)
HDL: 33 mg/dL — ABNORMAL LOW
LDL Chol Calc (NIH): 43 mg/dL (ref 0–99)
Triglycerides: 284 mg/dL — ABNORMAL HIGH (ref 0–149)
VLDL Cholesterol Cal: 43 mg/dL — ABNORMAL HIGH (ref 5–40)

## 2024-12-08 LAB — GLUCOSE, CAPILLARY
Glucose-Capillary: 87 mg/dL (ref 70–99)
Glucose-Capillary: 97 mg/dL (ref 70–99)
Glucose-Capillary: 103 mg/dL — ABNORMAL HIGH (ref 70–99)

## 2024-12-08 LAB — CMP14+EGFR
ALT: 47 [IU]/L — ABNORMAL HIGH (ref 0–44)
AST: 36 [IU]/L (ref 0–40)
Albumin: 4.8 g/dL (ref 4.1–5.1)
Alkaline Phosphatase: 82 [IU]/L (ref 47–123)
BUN/Creatinine Ratio: 10 (ref 9–20)
BUN: 17 mg/dL (ref 6–24)
Bilirubin Total: 0.6 mg/dL (ref 0.0–1.2)
CO2: 23 mmol/L (ref 20–29)
Calcium: 10.7 mg/dL — ABNORMAL HIGH (ref 8.7–10.2)
Chloride: 101 mmol/L (ref 96–106)
Creatinine, Ser: 1.64 mg/dL — ABNORMAL HIGH (ref 0.76–1.27)
Globulin, Total: 2.5 g/dL (ref 1.5–4.5)
Glucose: 174 mg/dL — ABNORMAL HIGH (ref 70–99)
Potassium: 4.8 mmol/L (ref 3.5–5.2)
Sodium: 141 mmol/L (ref 134–144)
Total Protein: 7.3 g/dL (ref 6.0–8.5)
eGFR: 53 mL/min/{1.73_m2} — ABNORMAL LOW

## 2024-12-08 LAB — BASIC METABOLIC PANEL WITH GFR
Anion gap: 14 (ref 5–15)
BUN: 34 mg/dL — ABNORMAL HIGH (ref 6–20)
CO2: 22 mmol/L (ref 22–32)
Calcium: 9.4 mg/dL (ref 8.9–10.3)
Chloride: 107 mmol/L (ref 98–111)
Creatinine, Ser: 1.14 mg/dL (ref 0.61–1.24)
GFR, Estimated: >60 mL/min
Glucose, Bld: 98 mg/dL (ref 70–99)
Potassium: 4.9 mmol/L (ref 3.5–5.1)
Sodium: 143 mmol/L (ref 135–145)

## 2024-12-08 LAB — PROTIME-INR
INR: 1 (ref 0.8–1.2)
Prothrombin Time: 13.7 s (ref 11.4–15.2)

## 2024-12-08 LAB — CBG MONITORING, ED
Glucose-Capillary: 107 mg/dL — ABNORMAL HIGH (ref 70–99)
Glucose-Capillary: 94 mg/dL (ref 70–99)

## 2024-12-08 LAB — TYPE AND SCREEN
ABO/RH(D): O POS
Antibody Screen: NEGATIVE

## 2024-12-08 LAB — HEMOGLOBIN A1C
Est. average glucose Bld gHb Est-mCnc: 146 mg/dL
Hgb A1c MFr Bld: 6.7 % — ABNORMAL HIGH (ref 4.8–5.6)

## 2024-12-08 LAB — POC OCCULT BLOOD, ED: Fecal Occult Bld: POSITIVE — AB

## 2024-12-08 MED ORDER — STERILE WATER FOR IRRIGATION IR SOLN
Status: DC | PRN
  Administered 2024-12-08: 60 mL

## 2024-12-08 MED ORDER — FENTANYL CITRATE (PF) 100 MCG/2ML IJ SOLN: 12.5000 ug | INTRAMUSCULAR | Status: DC | PRN

## 2024-12-08 MED ORDER — GABAPENTIN 300 MG PO CAPS
300.0000 mg | ORAL_CAPSULE | Freq: Three times a day (TID) | ORAL | Status: DC
  Administered 2024-12-08 – 2024-12-09 (×3): 300 mg via ORAL
  Filled 2024-12-08 (×3): qty 1

## 2024-12-08 MED ORDER — MONTELUKAST SODIUM 10 MG PO TABS
10.0000 mg | ORAL_TABLET | Freq: Every day | ORAL | Status: DC
  Administered 2024-12-08: 10 mg via ORAL
  Filled 2024-12-08: qty 1

## 2024-12-08 MED ORDER — PROCHLORPERAZINE EDISYLATE 10 MG/2ML IJ SOLN: 10.0000 mg | INTRAMUSCULAR | Status: DC | PRN

## 2024-12-08 MED ORDER — FENTANYL CITRATE (PF) 50 MCG/ML IJ SOSY: 12.5000 ug | PREFILLED_SYRINGE | INTRAMUSCULAR | Status: DC | PRN

## 2024-12-08 MED ORDER — SODIUM CHLORIDE 0.9 % IV SOLN: INTRAVENOUS | Status: DC

## 2024-12-08 MED ORDER — ONDANSETRON HCL 4 MG/2ML IJ SOLN
4.0000 mg | Freq: Once | INTRAMUSCULAR | Status: AC
  Administered 2024-12-08: 4 mg via INTRAVENOUS
  Filled 2024-12-08: qty 2

## 2024-12-08 MED ORDER — ACETAMINOPHEN 650 MG RE SUPP: 650.0000 mg | Freq: Four times a day (QID) | RECTAL | Status: DC | PRN

## 2024-12-08 MED ORDER — ONDANSETRON HCL 4 MG PO TABS: 4.0000 mg | ORAL_TABLET | Freq: Four times a day (QID) | ORAL | Status: DC | PRN

## 2024-12-08 MED ORDER — ONDANSETRON HCL 4 MG/2ML IJ SOLN: 4.0000 mg | Freq: Four times a day (QID) | INTRAMUSCULAR | Status: DC | PRN

## 2024-12-08 MED ORDER — LIDOCAINE HCL (CARDIAC) PF 100 MG/5ML IV SOSY
PREFILLED_SYRINGE | INTRAVENOUS | Status: DC | PRN
  Administered 2024-12-08: 100 mg via INTRATRACHEAL

## 2024-12-08 MED ORDER — ORAL CARE MOUTH RINSE: 15.0000 mL | OROMUCOSAL | Status: DC | PRN

## 2024-12-08 MED ORDER — LACTATED RINGERS IV SOLN: INTRAVENOUS | Status: DC

## 2024-12-08 MED ORDER — BISACODYL 5 MG PO TBEC: 5.0000 mg | DELAYED_RELEASE_TABLET | Freq: Every day | ORAL | Status: DC | PRN

## 2024-12-08 MED ORDER — DULOXETINE HCL 30 MG PO CPEP
30.0000 mg | ORAL_CAPSULE | Freq: Two times a day (BID) | ORAL | Status: DC
  Administered 2024-12-08 – 2024-12-09 (×3): 30 mg via ORAL
  Filled 2024-12-08 (×3): qty 1

## 2024-12-08 MED ORDER — SODIUM CHLORIDE 0.9 % IV BOLUS
1000.0000 mL | Freq: Once | INTRAVENOUS | Status: AC
  Administered 2024-12-08: 1000 mL via INTRAVENOUS

## 2024-12-08 MED ORDER — TRAZODONE HCL 50 MG PO TABS: 25.0000 mg | ORAL_TABLET | Freq: Every evening | ORAL | Status: DC | PRN

## 2024-12-08 MED ORDER — CALCIUM GLUCONATE-NACL 1-0.675 GM/50ML-% IV SOLN
1.0000 g | Freq: Once | INTRAVENOUS | Status: AC
  Administered 2024-12-08: 1000 mg via INTRAVENOUS
  Filled 2024-12-08: qty 50

## 2024-12-08 MED ORDER — PANTOPRAZOLE SODIUM 40 MG IV SOLR
40.0000 mg | Freq: Once | INTRAVENOUS | Status: AC
  Administered 2024-12-08: 40 mg via INTRAVENOUS
  Filled 2024-12-08: qty 10

## 2024-12-08 MED ORDER — PANTOPRAZOLE SODIUM 40 MG IV SOLR
40.0000 mg | Freq: Two times a day (BID) | INTRAVENOUS | Status: DC
  Administered 2024-12-08 – 2024-12-09 (×2): 40 mg via INTRAVENOUS
  Filled 2024-12-08 (×2): qty 10

## 2024-12-08 MED ORDER — ROSUVASTATIN CALCIUM 20 MG PO TABS
40.0000 mg | ORAL_TABLET | Freq: Every day | ORAL | Status: DC
  Administered 2024-12-09: 40 mg via ORAL
  Filled 2024-12-08: qty 2

## 2024-12-08 MED ORDER — SODIUM BICARBONATE 8.4 % IV SOLN
50.0000 meq | Freq: Once | INTRAVENOUS | Status: AC
  Administered 2024-12-08: 50 meq via INTRAVENOUS
  Filled 2024-12-08: qty 50

## 2024-12-08 MED ORDER — SODIUM ZIRCONIUM CYCLOSILICATE 5 G PO PACK
10.0000 g | PACK | Freq: Once | ORAL | Status: AC
  Administered 2024-12-08: 10 g via ORAL
  Filled 2024-12-08: qty 2

## 2024-12-08 MED ORDER — OXYCODONE HCL 5 MG PO TABS: 5.0000 mg | ORAL_TABLET | Freq: Four times a day (QID) | ORAL | Status: DC | PRN

## 2024-12-08 MED ORDER — ACETAMINOPHEN 325 MG PO TABS: 650.0000 mg | ORAL_TABLET | Freq: Four times a day (QID) | ORAL | Status: DC | PRN

## 2024-12-08 MED ORDER — INSULIN ASPART 100 UNIT/ML IJ SOLN: 0.0000 [IU] | INTRAMUSCULAR | Status: DC

## 2024-12-08 MED ORDER — PROPOFOL 10 MG/ML IV BOLUS
INTRAVENOUS | Status: DC | PRN
  Administered 2024-12-08: 50 mg via INTRAVENOUS
  Administered 2024-12-08: 100 mg via INTRAVENOUS
  Administered 2024-12-08: 50 mg via INTRAVENOUS

## 2024-12-08 NOTE — Transfer of Care (Signed)
 Immediate Anesthesia Transfer of Care Note  Patient: Elijah Shaffer  Procedure(s) Performed: EGD (ESOPHAGOGASTRODUODENOSCOPY)  Patient Location: PACU  Anesthesia Type:General  Level of Consciousness: awake, alert , oriented, and patient cooperative  Airway & Oxygen  Therapy: Patient Spontanous Breathing and Patient connected to face mask oxygen   Post-op Assessment: Report given to RN, Post -op Vital signs reviewed and stable, and Patient moving all extremities X 4  Post vital signs: Reviewed and stable  Last Vitals:  Vitals Value Taken Time  BP 127/87 12/08/24 15:56  Temp    Pulse 108 12/08/24 15:59  Resp 19 12/08/24 15:59  SpO2 92 % 12/08/24 15:59  Vitals shown include unfiled device data.  Last Pain:  Vitals:   12/08/24 1534  TempSrc:   PainSc: 0-No pain         Complications: No notable events documented.

## 2024-12-08 NOTE — ED Notes (Signed)
 Called telemetry to monitor the patient. System is down currently, but they took the info down.

## 2024-12-08 NOTE — ED Notes (Signed)
 EKG was captured when patient arrived. The system is down to print. MD Nanavati notified.

## 2024-12-08 NOTE — Plan of Care (Signed)

## 2024-12-08 NOTE — Hospital Course (Signed)
 44 year old male with history of Barrett's esophagus, GAD, stage IIIa CKD, erythrocytosis, testosterone  replacement, GERD, hypertension, chronic fatigue, history of colonic polyps and melena, hyperlipidemia, type 2 diabetes mellitus with neuropathy, orthostatic hypotension, OSA, PSVT, subclinical hypothyroidism and vitamin D  deficiency who presented to the ED today by private vehicle complaining of melanotic stools and hematemesis with coffee ground dark appearing blood that was seen in the ED.  It occurred after he woke up this morning.  He also had a bowel movement that was dark and oily.  He denies abdominal pain.  He was orthostatically positive with hypotension tested in the ED today.  His potassium was 6.5.  His hemoglobin was elevated at 17.4.  His creatinine was 1.40.  His potassium was 6.5.  He was given medical treatments in the ED for this.  He tested guaiac positive.  GI was consulted and requested he be kept n.p.o. and he was given IV fluid hydration and admission was requested for further management.

## 2024-12-08 NOTE — Anesthesia Postprocedure Evaluation (Signed)
"   Anesthesia Post Note  Patient: Elijah Shaffer  Procedure(s) Performed: EGD (ESOPHAGOGASTRODUODENOSCOPY)  Patient location during evaluation: Phase II Anesthesia Type: MAC Level of consciousness: awake Pain management: pain level controlled Vital Signs Assessment: post-procedure vital signs reviewed and stable Respiratory status: spontaneous breathing and respiratory function stable Cardiovascular status: blood pressure returned to baseline and stable Postop Assessment: no headache and no apparent nausea or vomiting Anesthetic complications: no Comments: Late entry   No notable events documented.   Last Vitals:  Vitals:   12/08/24 1615 12/08/24 1640  BP: (!) 135/95 (!) 139/95  Pulse: 93 86  Resp: 15 18  Temp:  36.7 C  SpO2: 99% 95%    Last Pain:  Vitals:   12/08/24 1640  TempSrc: Oral  PainSc:                  Yvonna PARAS Rasheem Figiel      "

## 2024-12-08 NOTE — H&P (Signed)
 " History and Physical  [facility]  Elijah Shaffer FMW:[id number] DOB: 24-Aug-1981 DOA: 12/08/2024  PCP: Tobie Suzzane POUR, MD  Patient coming from: Home by POV  Level of care: Telemetry  I have personally briefly reviewed patient's old medical records in [facility]  Chief Complaint: vomiting blood   HPI: Elijah Shaffer is a 44 year old male with history of Barrett's esophagus, GAD, stage IIIa CKD, erythrocytosis, testosterone  replacement, GERD, hypertension, chronic fatigue, history of colonic polyps and melena, hyperlipidemia, type 2 diabetes mellitus with neuropathy, orthostatic hypotension, OSA, PSVT, subclinical hypothyroidism and vitamin D  deficiency who presented to the ED today by private vehicle complaining of melanotic stools and hematemesis with coffee ground dark appearing blood that was seen in the ED.  It occurred after he woke up this morning.  He also had a bowel movement that was dark and oily.  He denies abdominal pain.  He was orthostatically positive with hypotension tested in the ED today.  His potassium was 6.5.  His hemoglobin was elevated at 17.4.  His creatinine was 1.40.  His potassium was 6.5.  He was given medical treatments in the ED for this.  He tested guaiac positive.  GI was consulted and requested he be kept n.p.o. and he was given IV fluid hydration and admission was requested for further management.   Past Medical History:  Diagnosis Date   Abdominal hernia    2025   Acute renal injury 06/28/2017   Anxiety    Aspiration pneumonia of right upper lobe due to gastric secretions (HCC)    Diabetes mellitus without complication (HCC)    Diverticulitis    GERD (gastroesophageal reflux disease) 01/08/2018   Hiatal hernia    2022   Hypertension    Prediabetes    Seasonal allergies    Sleep apnea    Syncope 06/26/2021    Past Surgical History:  Procedure Laterality Date   BIOPSY  03/18/2021   Procedure: BIOPSY;  Surgeon: Eartha Angelia Sieving,  MD;  [facility];  Service: Gastroenterology;;  esophageal at Z-line   BIOPSY  05/23/2021   Procedure: BIOPSY;  Surgeon: Eartha Angelia Sieving, MD;  [facility];  Service: Gastroenterology;;   BIOPSY  08/19/2023   Procedure: BIOPSY;  Surgeon: Eartha Angelia Sieving, MD;  [facility];  Service: Gastroenterology;;   CARDIOPULMONARY EXERCISE TEST (CPX)  01/09/2022   Interpretation limited by submaximal effort.  Severe functional impairment when compared to max sedentary norms.  No cardiopulmonary limitations.  Noted tremor and generalized weakness that lead to premature exercise termination-consider neuromuscular evaluation.   COLONOSCOPY N/A 09/22/2017   Procedure: COLONOSCOPY;  Surgeon: Golda Claudis PENNER, MD;  [facility];  Service: Endoscopy;  Laterality: N/A;  9:55   COLONOSCOPY WITH PROPOFOL  N/A 05/23/2021   Procedure: COLONOSCOPY WITH PROPOFOL ;  Surgeon: Eartha Angelia Sieving, MD;  [facility];  Service: Gastroenterology;  Laterality: N/A;  7:30   DRUG INDUCED ENDOSCOPY N/A 10/25/2024   Procedure: DRUG INDUCED SLEEP ENDOSCOPY;  Surgeon: Tobie Eldora NOVAK, MD;  Location: [facility];  Service: ENT;  Laterality: N/A;   ESOPHAGOGASTRODUODENOSCOPY (EGD) WITH PROPOFOL  N/A 03/18/2021   Procedure: ESOPHAGOGASTRODUODENOSCOPY (EGD) WITH PROPOFOL ;  Surgeon: Eartha Angelia Sieving, MD;  [facility];  Service: Gastroenterology;  Laterality: N/A;   ESOPHAGOGASTRODUODENOSCOPY (EGD) WITH PROPOFOL  N/A 05/23/2021   Procedure: ESOPHAGOGASTRODUODENOSCOPY (EGD) WITH PROPOFOL ;  Surgeon: Eartha Angelia Sieving, MD;  [facility];  Service: Gastroenterology;  Laterality: N/A;   ESOPHAGOGASTRODUODENOSCOPY (EGD) WITH PROPOFOL  N/A 08/19/2023   Procedure: ESOPHAGOGASTRODUODENOSCOPY (EGD) WITH PROPOFOL ;  Surgeon: Eartha Angelia Sieving, MD;  [facility];  Service: Gastroenterology;  Laterality: N/A;   11:45AM;ASA 3   ESOPHAGOGASTRODUODENOSCOPY (EGD) WITH PROPOFOL  N/A 10/08/2023   Procedure: ESOPHAGOGASTRODUODENOSCOPY (EGD) WITH PROPOFOL ;  Surgeon: Eartha Angelia Sieving, MD;  [facility];  Service: Gastroenterology;  Laterality: N/A;  1:45PM;ASA 1-2   GIVENS CAPSULE STUDY N/A 10/08/2023   Procedure: GIVENS CAPSULE STUDY;  Surgeon: Eartha Angelia Sieving, MD;  [facility];  Service: Gastroenterology;  Laterality: N/A;  1:45PM;ASA 1-2   POLYPECTOMY  09/22/2017   Procedure: POLYPECTOMY;  Surgeon: Golda Claudis PENNER, MD;  [facility];  Service: Endoscopy;;   RIGHT/LEFT HEART CATH AND CORONARY ANGIOGRAPHY N/A 12/12/2021   Procedure: RIGHT/LEFT HEART CATH AND CORONARY ANGIOGRAPHY;  Surgeon: Cherrie Sieving SAUNDERS, MD;  [facility];  Service: Cardiovascular:: Normal Coronaries & Hemodynamics.  EF 50-55%. RA = 4 mmHg, RV = 23/4; PA = 22/6 (14) & PCW = 7; Ao sat = 95%; PA sat = 76%, 76%& SVC sat = 74%Fick cardiac output/index = 7.1/3.0; PVR = 1.0 WU   TRANSTHORACIC ECHOCARDIOGRAM  02/24/2021   EF 60 to 65%.  Normal LV function.  Normal valves.  Mild RV dilation with normal function.   ZIO PATCH EVENT MONITOR  01/2022   Sinus rhythm with heart rate range 24 to 159 bpm, 1  AVB and Wenckebach block noted along with rare PACs and PVCs.  No arrhythmias.     reports that he has never smoked. He has been exposed to tobacco smoke. He has never used smokeless tobacco. He reports that he does not currently use alcohol. He reports that he does not use drugs.  Allergies[1]  Family History  Problem Relation Age of Onset   Healthy Mother    Cervical cancer Mother    Cancer Father    Colon cancer Maternal Grandmother    Colon cancer Maternal Grandfather     Prior to Admission medications  Medication Sig Start Date End Date Taking? Authorizing Provider  albuterol  (VENTOLIN  HFA) 108 (90 Base) MCG/ACT inhaler Inhale 1-2 puffs into the lungs every 6 (six)  hours as needed for wheezing or shortness of breath.    [provider]  amLODipine  (NORVASC ) 5 MG tablet Take 1 tablet (5 mg total) by mouth daily. 08/17/24   Tobie Suzzane POUR, MD  b complex vitamins capsule Take 1 capsule by mouth daily. With D3 gummie    [provider]  Blood Glucose Monitoring Suppl DEVI 1 each by Does not apply route in the morning, at noon, and at bedtime. May substitute to any manufacturer covered by patient's insurance. 10/13/23   Tobie Suzzane POUR, MD  celecoxib  (CELEBREX ) 100 MG capsule Take 1 capsule (100 mg total) by mouth daily. 11/23/24   Tobie Suzzane POUR, MD  cetirizine (ZYRTEC) 10 MG tablet Take 10 mg by mouth daily.    [provider]  Cholecalciferol (VITAMIN D -3 PO) Take by mouth daily at 6 (six) AM.    [provider]  clindamycin  (CLEOCIN  T) 1 % lotion APPLY TO BUMPS ONCE DAILY AFTER SHOWER. Patient taking differently: as needed. APPLY TO BUMPS ONCE DAILY AFTER SHOWER. 07/01/24   Jackquline Sawyer, MD  clobetasol  cream (TEMOVATE ) 0.05 % Spot treat more severe areas once to twice daily until improved. Avoid applying to face, groin, and axilla. Use as directed. Long-term use can  cause thinning of the skin. Patient taking differently: as needed. Spot treat more severe areas once to twice daily until improved. Avoid applying to face, groin, and axilla. Use as directed. Long-term use can cause thinning of the skin. 03/17/24   Jackquline Sawyer, MD  Continuous Glucose Sensor (DEXCOM G7 SENSOR) MISC Use it to check blood glucose 3 times before meals, at bedtime and as needed. Patient not taking: Reported on 12/07/2024 04/26/24   Tobie Suzzane POUR, MD  Continuous Glucose Transmitter (DEXCOM G6 TRANSMITTER) MISC Use to check blood sugar daily DX e11.65 Patient not taking: Reported on 12/07/2024 10/23/23   Tobie Suzzane POUR, MD  cyclobenzaprine  (FLEXERIL ) 5 MG tablet Take 1 tablet (5 mg total) by mouth 3 (three) times daily as needed for muscle spasms. 07/23/24    Tobie Suzzane POUR, MD  diphenhydrAMINE -zinc  acetate (BENADRYL  EXTRA STRENGTH) cream Apply 1 application topically 3 (three) times daily as needed for itching. 12/07/21   Paseda, Folashade R, FNP  docusate sodium  (COLACE) 100 MG capsule Take 100 mg by mouth 2 (two) times daily.    [provider]  Dulaglutide  (TRULICITY ) 1.5 MG/0.5ML SOAJ Inject 1.5 mg into the skin once a week. Patient not taking: Reported on 12/07/2024 11/23/24   Tobie Suzzane POUR, MD  DULoxetine  (CYMBALTA ) 30 MG capsule Take 30 mg by mouth 2 (two) times daily. 01/05/24   [provider]  fenofibrate  micronized (LOFIBRA) 134 MG capsule Take 1 capsule (134 mg total) by mouth daily before breakfast. 11/16/24 11/11/25  Anner Alm ORN, MD  fluticasone  (FLONASE ) 50 MCG/ACT nasal spray Place 1 spray into both nostrils daily. 05/20/24 05/20/25  Assaker, Darrin, MD  gabapentin  (NEURONTIN ) 300 MG capsule Take 300 mg by mouth 3 (three) times daily.    [provider]  Glucose Blood (BLOOD GLUCOSE TEST STRIPS) STRP 1 each by In Vitro route in the morning, at noon, and at bedtime. May substitute to any manufacturer covered by patient's insurance. 10/13/23   Tobie Suzzane POUR, MD  hydrOXYzine  (ATARAX ) 25 MG tablet TAKE 1 TO 2 TABLETS BY MOUTH AT BEDTIME AS NEEDED FOR ITCHING 08/30/24   Jackquline Sawyer, MD  JARDIANCE  10 MG TABS tablet TAKE 1 TABLET BY MOUTH ONCE DAILY BEFORE BREAKFAST 11/30/24   Tobie Suzzane POUR, MD  ketoconazole  (NIZORAL ) 2 % shampoo Massage into scalp 2-3 times a week, let sit several minutes before rinsing. 03/17/24   Jackquline Sawyer, MD  lubiprostone  (AMITIZA ) 24 MCG capsule TAKE 1 CAPSULE BY MOUTH TWICE DAILY WITH A MEAL 09/16/24   Carlan, Chelsea L, NP  mometasone  (ELOCON ) 0.1 % cream Apply topically qd/bid for itchy areas at legs as needed for atopic dermatitis 11/09/24   Jackquline Sawyer, MD  montelukast  (SINGULAIR ) 10 MG tablet TAKE 1 TABLET BY MOUTH AT BEDTIME 07/30/24   Kara Dorn NOVAK, MD  omeprazole   (PRILOSEC) 40 MG capsule Take 1 capsule (40 mg total) by mouth 2 (two) times daily. 12/07/24   Carlan, Chelsea L, NP  ondansetron  (ZOFRAN ) 4 MG tablet Take 1 tablet (4 mg total) by mouth every 6 (six) hours as needed for nausea. 06/04/21   Arloa Suzen RAMAN, NP  rosuvastatin  (CRESTOR ) 40 MG tablet Take 1 tablet (40 mg total) by mouth daily. 04/26/24   Anner Alm ORN, MD  Testosterone  25 MG/2.5GM (1%) GEL Place 5 g onto the skin every morning. 1 packet to each shoulder q am. 06/28/24   Nieves Cough, MD  triamcinolone  cream (KENALOG ) 0.1 %  03/07/22   [provider]  Vitamin D , Ergocalciferol , (DRISDOL ) 1.25 MG (50000 UNIT) CAPS capsule Take 1 capsule by mouth once a week 08/16/24   Tobie Suzzane POUR, MD    Physical Exam: Vitals:   12/08/24 0923 12/08/24 0925  BP:  118/87  Pulse:  77  Resp:  20  Temp:  97.7 F (36.5 C)  TempSrc:  Oral  SpO2:  95%  Weight: 122.5 kg   Height: 6' 3 (1.905 m)     Constitutional: NAD, calm, comfortable Eyes: PERRL, lids and conjunctivae normal ENMT: Mucous membranes are moist. Posterior pharynx clear of any exudate or lesions.Normal dentition.  Neck: normal, supple, no masses, no thyromegaly Respiratory: clear to auscultation bilaterally, no wheezing, no crackles. Normal respiratory effort. No accessory muscle use.  Cardiovascular: normal s1, s2 sounds, no murmurs / rubs / gallops. No extremity edema. 2+ pedal pulses. No carotid bruits.  Abdomen: no tenderness, no masses palpated. No hepatosplenomegaly. Bowel sounds positive.  Musculoskeletal: no clubbing / cyanosis. No joint deformity upper and lower extremities. Good ROM, no contractures. Normal muscle tone.  Skin: no rashes, lesions, ulcers. No induration Neurologic: CN 2-12 grossly intact. Sensation intact, DTR normal. Strength 5/5 in all 4.  Psychiatric: Normal judgment and insight. Alert and oriented x 3. Normal mood.   Labs on Admission: I have personally reviewed following labs and  imaging studies  CBC: Recent Labs  Lab 12/07/24 1119 12/08/24 1022  WBC 8.3 10.0  NEUTROABS 4.1 6.6  HGB 18.2* 17.4*  HCT 54.9* 53.2*  MCV 89 88.1  PLT 264 278   Basic Metabolic Panel: Recent Labs  Lab 12/07/24 1119 12/08/24 1022  NA 141 139  K 4.8 6.5*  CL 101 104  CO2 23 27  GLUCOSE 174* 176*  BUN 17 35*  CREATININE 1.64* 1.40*  CALCIUM  10.7* 9.8   GFR: Estimated Creatinine Clearance: 95.9 mL/min (A) (by C-G formula based on SCr of 1.4 mg/dL (H)). Liver Function Tests: Recent Labs  Lab 12/07/24 1119 12/08/24 1022  AST 36 33  ALT 47* 45*  ALKPHOS 82 76  BILITOT 0.6 0.9  PROT 7.3 7.8  ALBUMIN 4.8 5.1*   No results for input(s): LIPASE, AMYLASE in the last 168 hours. No results for input(s): AMMONIA in the last 168 hours. Coagulation Profile: Recent Labs  Lab 12/08/24 1022  INR 1.0   Cardiac Enzymes: No results for input(s): CKTOTAL, CKMB, CKMBINDEX, TROPONINI in the last 168 hours. BNP (last 3 results) No results for input(s): PROBNP in the last 8760 hours. HbA1C: Recent Labs    12/07/24 1119  HGBA1C 6.7*   CBG: No results for input(s): GLUCAP in the last 168 hours. Lipid Profile: Recent Labs    12/07/24 1119  CHOL 119  HDL 33*  LDLCALC 43  TRIG 715*  CHOLHDL 3.6   Thyroid  Function Tests: No results for input(s): TSH, T4TOTAL, FREET4, T3FREE, THYROIDAB in the last 72 hours. Anemia Panel: No results for input(s): VITAMINB12, FOLATE, FERRITIN, TIBC, IRON, RETICCTPCT in the last 72 hours. Urine analysis:    Component Value Date/Time   COLORURINE YELLOW 08/08/2023 1322   APPEARANCEUR Clear 06/28/2024 1351   LABSPEC 1.017 08/08/2023 1322   PHURINE 6.0 08/08/2023 1322   GLUCOSEU 3+ (A) 06/28/2024 1351   HGBUR NEGATIVE 08/08/2023 1322   BILIRUBINUR Negative 06/28/2024 1351   KETONESUR NEGATIVE 08/08/2023 1322   PROTEINUR Trace 06/28/2024 1351   PROTEINUR NEGATIVE 08/08/2023 1322   UROBILINOGEN  0.2 04/18/2010 2028   NITRITE Negative 06/28/2024 1351   NITRITE NEGATIVE 08/08/2023 1322  LEUKOCYTESUR Negative 06/28/2024 1351   LEUKOCYTESUR NEGATIVE 08/08/2023 1322    Radiological Exams on Admission: No results found.  EKG: Independently reviewed. Sinus rhythm  Assessment/Plan Principal Problem:   Coffee ground emesis Active Problems:   Orthostatic hypotension   Hyperkalemia   Guaiac positive stools   GERD (gastroesophageal reflux disease)   Barrett's esophagus   History of colonic polyps   Anxiety   Mixed hyperlipidemia   PSVT (paroxysmal supraventricular tachycardia)   NASH (nonalcoholic steatohepatitis)   OSA (obstructive sleep apnea)   Erythrocytosis   Essential hypertension   Neuropathy   Postural dizziness with presyncope   Chronic kidney disease, stage 3a (HCC)   Chronic fatigue   Type 2 diabetes mellitus with diabetic chronic kidney disease (HCC)   Vitamin D  deficiency   Melena   Subclinical hypothyroidism   Coffee Ground Emesis Melenotic stool Guaiac positive stool -- appreciate GI team consult and recommendations -- keep NPO for now  -- follow CBC -- supportive care measures as ordered -- pantoprazole  ordered IV 40 mg BID   Erythrocytosis -- Possibly secondary to testosterone  replacement -- He is also clinically dehydrated -- IV fluids as ordered -- recheck CBC as ordered   Orthostatic hypotension -- IV fluids as ordered  Stage 3A CKD -- stable, follow  Type 2 diabetes mellitus, controlled, with CKD and neuropathy -- while he is NPO we are giving renal sensitive SSI coverage with frequent CBG monitoring  Hyperkalemia -- treated medically in ED -- recheck K at 1300 today   DVT prophylaxis: SCDs  Code Status: Full   Family Communication: bedside updates   Disposition Plan: home   Consults called: GI   Admission status: OBV  Time spent: 63 mins   Level of care: Telemetry Brazil Voytko MD [facility] How to contact the  [facility] Attending or Consulting provider 7A - 7P or covering provider during after hours 7P -7A, for this patient?  Check the care team in [facility] and look for a) attending/consulting [facility] provider listed and b) the [facility] team listed Log into [email or web address] and use [place]'s universal password to access. If you do not have the password, please contact the hospital operator. Locate the [facility] provider you are looking for under [facility] and page to a number that you can be directly reached. If you still have difficulty reaching the provider, please page the [facility] (Director on Call) for the Hospitalists listed on amion for assistance.   If 7PM-7AM, please contact night-coverage [email or web address] Password [facility]  12/08/2024, 11:32 AM        [1]  Allergies Allergen Reactions   Loratadine-Pseudoephedrine Er Palpitations   Pollen Extract Other (See Comments)    Seasonal allergies   "

## 2024-12-08 NOTE — Op Note (Addendum)
 [facility] Patient Name: Elijah Shaffer Procedure Date: 12/08/2024 3:06 PM MRN: [id number] Date of Birth: 10/19/1981 Attending MD: Lamar Ozell Hollingshead , MD, [id number] CSN: [id number] Age: 44 Admit Type: Inpatient Procedure:                Upper GI endoscopy Indications:              Coffee-ground emesis, Melena Providers:                Lamar Ozell Hollingshead, MD, Rosina Sprague, Daphne Mulch                            Technician, Technician Referring MD:              Medicines:                Propofol  per Anesthesia Complications:            No immediate complications. Estimated Blood Loss:     Estimated blood loss: none. Procedure:                Pre-Anesthesia Assessment:                           - Prior to the procedure, a History and Physical                            was performed, and patient medications and                            allergies were reviewed. The patient's tolerance of                            previous anesthesia was also reviewed. The risks                            and benefits of the procedure and the sedation                            options and risks were discussed with the patient.                            All questions were answered, and informed consent                            was obtained. Prior Anticoagulants: The patient has                            taken no anticoagulant or antiplatelet agents. ASA                            Grade Assessment: III - A patient with severe                            systemic disease. After reviewing the risks and  benefits, the patient was deemed in satisfactory                            condition to undergo the procedure.                           After obtaining informed consent, the endoscope was                            passed under direct vision. Throughout the                            procedure, the patient's blood pressure, pulse, and                             oxygen  saturations were monitored continuously. The                            [id number] ([id number]) Upper was introduced through                            the mouth, and advanced to the second part of                            duodenum. The upper GI endoscopy was accomplished                            without difficulty. The patient tolerated the                            procedure well. Scope In: 3:38:58 PM Scope Out: 3:50:22 PM Total Procedure Duration: 0 hours 11 minutes 24 seconds  Findings:      Long segment Barrett's esophagus identified without nodularity. No       esophagitis. 2 columns of grade 1-2 esophageal varices without bleeding       stigmata present.      Large hiatal hernia with circumferential Cameron excoriations blood       tinged gastric mucosa in the hernia sac. No focally bleeding lesion       identified. Patient has moderate changes of portal hypertensive       gastropathy involving the proximal gastric mucosa. Pylorus patent.      The duodenal bulb and second portion of the duodenum were normal. The       scope was pulled into the hernia sac where Hemospray was deployed       circumferentially on the [place] lesions. Good hemostasis maintained       with this maneuver. Impression:               - Long segment Barrett's epithelium without                            nodularity -not manipulated                           -Grade 1-2 esophageal varices as described without  bleeding stigmata                           -Large hiatal hernia with circumferential Ole                            excoriations straddling the diaphragmatic hiatus.                           -Recent mucosal bleeding in the hernia sac without                            focal lesion identified.                           -Hemospray deployed                           -Changes consistent with portal hypertensive                            gastropathy. Normal duodenal  bulb and second                            portion of the duodenum.                           - No specimens collected. Moderate Sedation:      Moderate (conscious) sedation was personally administered by an       anesthesia professional. The following parameters were monitored: oxygen        saturation, heart rate, blood pressure, respiratory rate, EKG, adequacy       of pulmonary ventilation, and response to care. Recommendation:           - Return patient to hospital ward for ongoing care.                           - Clear liquid diet; advance diet in the a.m if no                            further evidence of GI bleeding..                           - Continue present medications. Twice daily PPI                            therapy.                           -Ideally, he should go for hiatal hernia repair but                            he has evidence of portal hypertension which would                            make that approach a challenge.                           -  Repeat H&H tomorrow morning. If stable in a.m.,                            he could probably go home from a GI standpoint.                           - Return to the office for cirrhosis care.                           - Consider adding a nonselective beta-blocker to                            his regimen when he comes back for outpatient GI                            follow-up.                           - At patient request, I called Adine, brother,                            [phone] -reviewed findings and recommendations. Procedure Code(s):        --- Professional ---                           [phone], Esophagogastroduodenoscopy, flexible,                            transoral; diagnostic, including collection of                            specimen(s) by brushing or washing, when performed                            (separate procedure) Diagnosis Code(s):        --- Professional ---                            K92.0, Hematemesis                           K92.1, Melena (includes Hematochezia) CPT copyright 2022 [employer] Association. All rights reserved. The codes documented in this report are preliminary and upon coder review may  be revised to meet current compliance requirements. Lamar HERO. Hrithik Boschee, MD Lamar Ozell Hollingshead, MD 12/08/2024 4:09:53 PM This report has been signed electronically. Number of Addenda: 0

## 2024-12-08 NOTE — Consult Note (Addendum)
 "  Gastroenterology Consult   Referring Provider: No ref. provider found Primary Care Physician:  Tobie Suzzane POUR, MD Primary Gastroenterologist:  Previously Dr. Eartha   Patient ID: Elijah Shaffer; [id number]; [birth year]   Admit date: 12/08/2024  LOS: 0 days   Date of Consultation: 12/08/2024  Reason for Consultation:  coffee ground emesis, melena   History of Present Illness   Elijah Shaffer is a 44 y.o. year old male with past medical history of GERD complicated by long segment nondysplastic Barrett's esophagus, history of diverticulitis and anxiety,  OSA, large hiatal hernia, polyneuropathy, IBS-C who presented to the ED today with c/o coffee ground emesis and dark stools. GI consulted for further evaluation.  CMP showed potassium of 6.5, Creat 1.4 (baseline elevated around 1.3-1.6) AST 33 ALT 45  Hgb 17.4 MCV 88.1 WBC 10 plt count 278 INR 1  FOBT positive  Consult: Patient well known to me, seen in outpatient GI clinic in his usual state of health yesterday. He States he began feeling diaphoretic, feverish last night around midnight. Had  a sensation to defecate and felt nauseated. He went to the restroom and vomited Coffee ground looking substance then had a BM that he reports was very dark in color. Had subsequent episodes of vomiting and stooling into this morning with continued melena and diarrhea. Some upper abdominal discomfort initially but none now. No BRBPR or in emesis. Denies any NSAIDs. He does not drink or smoke.   Last Colonoscopy:05/2021 -  The perianal and digital rectal examinations were normal. Multiple large-mouthed diverticula were found in the sigmoid colon, transverse colon, ascending colon and cecum. Non-bleeding internal hemorrhoids were found during retroflexion. The  hemorrhoids were small.   Recommended to repeat in 5 years due to family history of cancer.   Last Endoscopy:09/2023 - Esophageal mucosal changes secondary to                             established long-segment Barrett's disease.                           - 8 cm hiatal hernia.                           - Normal examined duodenum. Capsule endoscopy                            deployed in duodenum.                           - No specimens collected. SMALL BOWEL, BIOPSY:  - Benign small bowel mucosa with no significant pathologic changes    Past Medical History:  Diagnosis Date   Abdominal hernia    2025   Acute renal injury 06/28/2017   Anxiety    Aspiration pneumonia of right upper lobe due to gastric secretions (HCC)    Diabetes mellitus without complication (HCC)    Diverticulitis    GERD (gastroesophageal reflux disease) 01/08/2018   Hiatal hernia    2022   Hypertension    Prediabetes    Seasonal allergies    Sleep apnea    Syncope 06/26/2021    Past Surgical History:  Procedure Laterality Date   BIOPSY  03/18/2021   Procedure: BIOPSY;  Surgeon: Eartha Angelia Sieving, MD;  [facility]: [facility];  Service: Gastroenterology;;  esophageal at Z-line   BIOPSY  05/23/2021   Procedure: BIOPSY;  Surgeon: Eartha Angelia Sieving, MD;  Location: [facility];  Service: Gastroenterology;;   BIOPSY  08/19/2023   Procedure: BIOPSY;  Surgeon: Eartha Angelia Sieving, MD;  [facility]: [facility];  Service: Gastroenterology;;   CARDIOPULMONARY EXERCISE TEST (CPX)  01/09/2022   Interpretation limited by submaximal effort.  Severe functional impairment when compared to max sedentary norms.  No cardiopulmonary limitations.  Noted tremor and generalized weakness that lead to premature exercise termination-consider neuromuscular evaluation.   COLONOSCOPY N/A 09/22/2017   Procedure: COLONOSCOPY;  Surgeon: Golda Claudis PENNER, MD;  Location: [facility];  Service: Endoscopy;  Laterality: N/A;  9:55   COLONOSCOPY WITH PROPOFOL  N/A 05/23/2021   Procedure: COLONOSCOPY WITH PROPOFOL ;  Surgeon: Eartha Angelia Sieving, MD;  [facility]: [facility];  Service:  Gastroenterology;  Laterality: N/A;  7:30   DRUG INDUCED ENDOSCOPY N/A 10/25/2024   Procedure: DRUG INDUCED SLEEP ENDOSCOPY;  Surgeon: Tobie Eldora NOVAK, MD;  Location: [facility];  Service: ENT;  Laterality: N/A;   ESOPHAGOGASTRODUODENOSCOPY (EGD) WITH PROPOFOL  N/A 03/18/2021   Procedure: ESOPHAGOGASTRODUODENOSCOPY (EGD) WITH PROPOFOL ;  Surgeon: Eartha Angelia Sieving, MD;  [facility]: [facility];  Service: Gastroenterology;  Laterality: N/A;   ESOPHAGOGASTRODUODENOSCOPY (EGD) WITH PROPOFOL  N/A 05/23/2021   Procedure: ESOPHAGOGASTRODUODENOSCOPY (EGD) WITH PROPOFOL ;  Surgeon: Eartha Angelia Sieving, MD;  [facility]: [facility];  Service: Gastroenterology;  Laterality: N/A;   ESOPHAGOGASTRODUODENOSCOPY (EGD) WITH PROPOFOL  N/A 08/19/2023   Procedure: ESOPHAGOGASTRODUODENOSCOPY (EGD) WITH PROPOFOL ;  Surgeon: Eartha Angelia Sieving, MD;  [facility];  Service: Gastroenterology;  Laterality: N/A;  11:45AM;ASA 3   ESOPHAGOGASTRODUODENOSCOPY (EGD) WITH PROPOFOL  N/A 10/08/2023   Procedure: ESOPHAGOGASTRODUODENOSCOPY (EGD) WITH PROPOFOL ;  Surgeon: Eartha Angelia Sieving, MD;  [facility]: [facility];  Service: Gastroenterology;  Laterality: N/A;  1:45PM;ASA 1-2   GIVENS CAPSULE STUDY N/A 10/08/2023   Procedure: GIVENS CAPSULE STUDY;  Surgeon: Eartha Angelia Sieving, MD;  [facility]: [facility];  Service: Gastroenterology;  Laterality: N/A;  1:45PM;ASA 1-2   POLYPECTOMY  09/22/2017   Procedure: POLYPECTOMY;  Surgeon: Golda Claudis PENNER, MD;  [facility]: [facility];  Service: Endoscopy;;   RIGHT/LEFT HEART CATH AND CORONARY ANGIOGRAPHY N/A 12/12/2021   Procedure: RIGHT/LEFT HEART CATH AND CORONARY ANGIOGRAPHY;  Surgeon: Cherrie Sieving SAUNDERS, MD;  Location: [facility];  Service: Cardiovascular:: Normal Coronaries & Hemodynamics.  EF 50-55%. RA = 4 mmHg, RV = 23/4; PA = 22/6 (14) & PCW = 7; Ao sat = 95%; PA sat = 76%, 76%& SVC sat = 74%Fick cardiac output/index =  7.1/3.0; PVR = 1.0 WU   TRANSTHORACIC ECHOCARDIOGRAM  02/24/2021   EF 60 to 65%.  Normal LV function.  Normal valves.  Mild RV dilation with normal function.   ZIO PATCH EVENT MONITOR  01/2022   Sinus rhythm with heart rate range 24 to 159 bpm, 1  AVB and Wenckebach block noted along with rare PACs and PVCs.  No arrhythmias.    Prior to Admission medications  Medication Sig Start Date End Date Taking? Authorizing Provider  albuterol  (VENTOLIN  HFA) 108 (90 Base) MCG/ACT inhaler Inhale 1-2 puffs into the lungs every 6 (six) hours as needed for wheezing or shortness of breath.    [provider]  amLODipine  (NORVASC ) 5 MG tablet Take 1 tablet (5 mg total) by mouth daily. 08/17/24   Tobie Suzzane POUR, MD  b complex vitamins capsule  Take 1 capsule by mouth daily. With D3 gummie    [provider]  Blood Glucose Monitoring Suppl DEVI 1 each by Does not apply route in the morning, at noon, and at bedtime. May substitute to any manufacturer covered by patient's insurance. 10/13/23   Tobie Suzzane POUR, MD  celecoxib  (CELEBREX ) 100 MG capsule Take 1 capsule (100 mg total) by mouth daily. 11/23/24   Tobie Suzzane POUR, MD  cetirizine (ZYRTEC) 10 MG tablet Take 10 mg by mouth daily.    [provider]  Cholecalciferol (VITAMIN D -3 PO) Take by mouth daily at 6 (six) AM.    [provider]  clindamycin  (CLEOCIN  T) 1 % lotion APPLY TO BUMPS ONCE DAILY AFTER SHOWER. Patient taking differently: as needed. APPLY TO BUMPS ONCE DAILY AFTER SHOWER. 07/01/24   Jackquline Sawyer, MD  clobetasol  cream (TEMOVATE ) 0.05 % Spot treat more severe areas once to twice daily until improved. Avoid applying to face, groin, and axilla. Use as directed. Long-term use can cause thinning of the skin. Patient taking differently: as needed. Spot treat more severe areas once to twice daily until improved. Avoid applying to face, groin, and axilla. Use as directed. Long-term use can cause thinning of the skin.  03/17/24   Jackquline Sawyer, MD  Continuous Glucose Sensor (DEXCOM G7 SENSOR) MISC Use it to check blood glucose 3 times before meals, at bedtime and as needed. Patient not taking: Reported on 12/07/2024 04/26/24   Tobie Suzzane POUR, MD  Continuous Glucose Transmitter (DEXCOM G6 TRANSMITTER) MISC Use to check blood sugar daily DX e11.65 Patient not taking: Reported on 12/07/2024 10/23/23   Tobie Suzzane POUR, MD  cyclobenzaprine  (FLEXERIL ) 5 MG tablet Take 1 tablet (5 mg total) by mouth 3 (three) times daily as needed for muscle spasms. 07/23/24   Tobie Suzzane POUR, MD  diphenhydrAMINE -zinc  acetate (BENADRYL  EXTRA STRENGTH) cream Apply 1 application topically 3 (three) times daily as needed for itching. 12/07/21   Paseda, Folashade R, FNP  docusate sodium  (COLACE) 100 MG capsule Take 100 mg by mouth 2 (two) times daily.    [provider]  Dulaglutide  (TRULICITY ) 1.5 MG/0.5ML SOAJ Inject 1.5 mg into the skin once a week. Patient not taking: Reported on 12/07/2024 11/23/24   Tobie Suzzane POUR, MD  DULoxetine  (CYMBALTA ) 30 MG capsule Take 30 mg by mouth 2 (two) times daily. 01/05/24   [provider]  fenofibrate  micronized (LOFIBRA) 134 MG capsule Take 1 capsule (134 mg total) by mouth daily before breakfast. 11/16/24 11/11/25  Anner Alm ORN, MD  fluticasone  (FLONASE ) 50 MCG/ACT nasal spray Place 1 spray into both nostrils daily. 05/20/24 05/20/25  Assaker, Darrin, MD  gabapentin  (NEURONTIN ) 300 MG capsule Take 300 mg by mouth 3 (three) times daily.    [provider]  Glucose Blood (BLOOD GLUCOSE TEST STRIPS) STRP 1 each by In Vitro route in the morning, at noon, and at bedtime. May substitute to any manufacturer covered by patient's insurance. 10/13/23   Tobie Suzzane POUR, MD  hydrOXYzine  (ATARAX ) 25 MG tablet TAKE 1 TO 2 TABLETS BY MOUTH AT BEDTIME AS NEEDED FOR ITCHING 08/30/24   Jackquline Sawyer, MD  JARDIANCE  10 MG TABS tablet TAKE 1 TABLET BY MOUTH ONCE DAILY BEFORE BREAKFAST 11/30/24   Tobie Suzzane POUR, MD  ketoconazole  (NIZORAL ) 2 % shampoo Massage into scalp 2-3 times a week, let sit several minutes before rinsing. 03/17/24   Jackquline Sawyer, MD  lubiprostone  (AMITIZA ) 24 MCG capsule TAKE 1 CAPSULE BY MOUTH TWICE  DAILY WITH A MEAL 09/16/24   Tamisha Nordstrom L, NP  mometasone  (ELOCON ) 0.1 % cream Apply topically qd/bid for itchy areas at legs as needed for atopic dermatitis 11/09/24   Jackquline Sawyer, MD  montelukast  (SINGULAIR ) 10 MG tablet TAKE 1 TABLET BY MOUTH AT BEDTIME 07/30/24   Kara Dorn NOVAK, MD  omeprazole  (PRILOSEC) 40 MG capsule Take 1 capsule (40 mg total) by mouth 2 (two) times daily. 12/07/24   Ameyah Bangura L, NP  ondansetron  (ZOFRAN ) 4 MG tablet Take 1 tablet (4 mg total) by mouth every 6 (six) hours as needed for nausea. 06/04/21   Arloa Suzen RAMAN, NP  rosuvastatin  (CRESTOR ) 40 MG tablet Take 1 tablet (40 mg total) by mouth daily. 04/26/24   Anner Alm ORN, MD  Testosterone  25 MG/2.5GM (1%) GEL Place 5 g onto the skin every morning. 1 packet to each shoulder q am. 06/28/24   Nieves Cough, MD  triamcinolone  cream (KENALOG ) 0.1 %  03/07/22   [provider]  Vitamin D , Ergocalciferol , (DRISDOL ) 1.25 MG (50000 UNIT) CAPS capsule Take 1 capsule by mouth once a week 08/16/24   Tobie Suzzane POUR, MD    Current Facility-Administered Medications  Medication Dose Route Frequency Provider Last Rate Last Admin   calcium  gluconate 1 g/ 50 mL sodium chloride  IVPB  1 g Intravenous Once Charlyn Sora, MD 150 mL/hr at 12/08/24 1111 1,000 mg at 12/08/24 1111   Current Outpatient Medications  Medication Sig Dispense Refill   albuterol  (VENTOLIN  HFA) 108 (90 Base) MCG/ACT inhaler Inhale 1-2 puffs into the lungs every 6 (six) hours as needed for wheezing or shortness of breath.     amLODipine  (NORVASC ) 5 MG tablet Take 1 tablet (5 mg total) by mouth daily. 90 tablet 3   b complex vitamins capsule Take 1 capsule by mouth daily. With D3 gummie     Blood Glucose Monitoring  Suppl DEVI 1 each by Does not apply route in the morning, at noon, and at bedtime. May substitute to any manufacturer covered by patient's insurance. 1 each 0   celecoxib  (CELEBREX ) 100 MG capsule Take 1 capsule (100 mg total) by mouth daily. 30 capsule 3   cetirizine (ZYRTEC) 10 MG tablet Take 10 mg by mouth daily.     Cholecalciferol (VITAMIN D -3 PO) Take by mouth daily at 6 (six) AM.     clindamycin  (CLEOCIN  T) 1 % lotion APPLY TO BUMPS ONCE DAILY AFTER SHOWER. (Patient taking differently: as needed. APPLY TO BUMPS ONCE DAILY AFTER SHOWER.) 60 mL 3   clobetasol  cream (TEMOVATE ) 0.05 % Spot treat more severe areas once to twice daily until improved. Avoid applying to face, groin, and axilla. Use as directed. Long-term use can cause thinning of the skin. (Patient taking differently: as needed. Spot treat more severe areas once to twice daily until improved. Avoid applying to face, groin, and axilla. Use as directed. Long-term use can cause thinning of the skin.) 60 g 3   Continuous Glucose Sensor (DEXCOM G7 SENSOR) MISC Use it to check blood glucose 3 times before meals, at bedtime and as needed. (Patient not taking: Reported on 12/07/2024) 3 each 5   Continuous Glucose Transmitter (DEXCOM G6 TRANSMITTER) MISC Use to check blood sugar daily DX e11.65 (Patient not taking: Reported on 12/07/2024) 1 each 3   cyclobenzaprine  (FLEXERIL ) 5 MG tablet Take 1 tablet (5 mg total) by mouth 3 (three) times daily as needed for muscle spasms. 20 tablet 0   diphenhydrAMINE -zinc  acetate (BENADRYL  EXTRA STRENGTH)  cream Apply 1 application topically 3 (three) times daily as needed for itching. 28.4 g 0   docusate sodium  (COLACE) 100 MG capsule Take 100 mg by mouth 2 (two) times daily.     Dulaglutide  (TRULICITY ) 1.5 MG/0.5ML SOAJ Inject 1.5 mg into the skin once a week. (Patient not taking: Reported on 12/07/2024) 2 mL 3   DULoxetine  (CYMBALTA ) 30 MG capsule Take 30 mg by mouth 2 (two) times daily.     fenofibrate   micronized (LOFIBRA) 134 MG capsule Take 1 capsule (134 mg total) by mouth daily before breakfast. 90 capsule 3   fluticasone  (FLONASE ) 50 MCG/ACT nasal spray Place 1 spray into both nostrils daily. 100 mL 2   gabapentin  (NEURONTIN ) 300 MG capsule Take 300 mg by mouth 3 (three) times daily.     Glucose Blood (BLOOD GLUCOSE TEST STRIPS) STRP 1 each by In Vitro route in the morning, at noon, and at bedtime. May substitute to any manufacturer covered by patient's insurance. 100 strip 1   hydrOXYzine  (ATARAX ) 25 MG tablet TAKE 1 TO 2 TABLETS BY MOUTH AT BEDTIME AS NEEDED FOR ITCHING 60 tablet 1   JARDIANCE  10 MG TABS tablet TAKE 1 TABLET BY MOUTH ONCE DAILY BEFORE BREAKFAST 60 tablet 0   ketoconazole  (NIZORAL ) 2 % shampoo Massage into scalp 2-3 times a week, let sit several minutes before rinsing. 120 mL 11   lubiprostone  (AMITIZA ) 24 MCG capsule TAKE 1 CAPSULE BY MOUTH TWICE DAILY WITH A MEAL 180 capsule 0   mometasone  (ELOCON ) 0.1 % cream Apply topically qd/bid for itchy areas at legs as needed for atopic dermatitis 45 g 3   montelukast  (SINGULAIR ) 10 MG tablet TAKE 1 TABLET BY MOUTH AT BEDTIME 90 tablet 0   omeprazole  (PRILOSEC) 40 MG capsule Take 1 capsule (40 mg total) by mouth 2 (two) times daily. 60 capsule 3   ondansetron  (ZOFRAN ) 4 MG tablet Take 1 tablet (4 mg total) by mouth every 6 (six) hours as needed for nausea. 20 tablet 0   rosuvastatin  (CRESTOR ) 40 MG tablet Take 1 tablet (40 mg total) by mouth daily. 90 tablet 3   Testosterone  25 MG/2.5GM (1%) GEL Place 5 g onto the skin every morning. 1 packet to each shoulder q am. 150 g 5   triamcinolone  cream (KENALOG ) 0.1 %      Vitamin D , Ergocalciferol , (DRISDOL ) 1.25 MG (50000 UNIT) CAPS capsule Take 1 capsule by mouth once a week 12 capsule 1    Allergies as of 12/08/2024 - Review Complete 12/08/2024  Allergen Reaction Noted   Loratadine-pseudoephedrine er Palpitations 01/30/2022   Pollen extract Other (See Comments) 06/26/2021     Family History  Problem Relation Age of Onset   Healthy Mother    Cervical cancer Mother    Cancer Father    Colon cancer Maternal Grandmother    Colon cancer Maternal Grandfather     Social History   Socioeconomic History   Marital status: Single    Spouse name: Not on file   Number of children: Not on file   Years of education: Not on file   Highest education level: Not on file  Occupational History   Not on file  Tobacco Use   Smoking status: Never    Passive exposure: Past   Smokeless tobacco: Never  Vaping Use   Vaping status: Never Used  Substance and Sexual Activity   Alcohol use: Not Currently    Comment: occasionally   Drug use: No   Sexual activity:  Not on file  Other Topics Concern   Not on file  Social History Narrative   He currently has his own lawn care landscaping service. He previous had 80 yards before he got sick and since decreased. He works around a interior and spatial designer. One of the chemical names is roundup.    Social Drivers of Health   Tobacco Use: Low Risk (12/08/2024)   Patient History    Smoking Tobacco Use: Never    Smokeless Tobacco Use: Never    Passive Exposure: Past  Financial Resource Strain: Not on file  Food Insecurity: No Food Insecurity (06/20/2023)   Hunger Vital Sign    Worried About Running Out of Food in the Last Year: Never true    Ran Out of Food in the Last Year: Never true  Transportation Needs: No Transportation Needs (12/27/2022)   PRAPARE - Administrator, Civil Service (Medical): No    Lack of Transportation (Non-Medical): No  Physical Activity: Insufficiently Active (03/29/2022)   Exercise Vital Sign    Days of Exercise per Week: 2 days    Minutes of Exercise per Session: 10 min  Stress: Stress Concern Present (05/07/2022)   [employer] of Occupational Health - Occupational Stress Questionnaire    Feeling of Stress : To some extent  Social Connections: Not on file  Intimate Partner Violence: Not  At Risk (06/20/2023)   Humiliation, Afraid, Rape, and Kick questionnaire    Fear of Current or Ex-Partner: No    Emotionally Abused: No    Physically Abused: No    Sexually Abused: No  Depression (PHQ2-9): Low Risk (11/23/2024)   Depression (PHQ2-9)    PHQ-2 Score: 0  Alcohol Screen: Not on file  Housing: Unknown (11/27/2023)   Received from [facility]   Epic    Unable to Pay for Housing in the Last Year: Not on file    Number of Times Moved in the Last Year: Not on file    At any time in the past 12 months, were you homeless or living in a shelter (including now)?: No  Utilities: Not At Risk (12/27/2022)   [facility] Utilities    Threatened with loss of utilities: No  Health Literacy: Adequate Health Literacy (06/20/2023)   B1300 Health Literacy    Frequency of need for help with medical instructions: Never     Review of Systems   Gen: Denies any fever, chills, loss of appetite, change in weight or weight loss CV: Denies chest pain, heart palpitations, syncope, edema  Resp: Denies shortness of breath with rest, cough, wheezing, coughing up blood, and pleurisy. GI: denies hematochezia,constipation, dysphagia, odyonophagia, early satiety or weight loss. +nausea +vomiting +coffee ground emesis +melena  GU : Denies urinary burning, blood in urine, urinary frequency, and urinary incontinence. MS: Denies joint pain, limitation of movement, swelling, cramps, and atrophy.  Derm: Denies rash, itching, dry skin, hives. Psych: Denies depression, anxiety, memory loss, hallucinations, and confusion. Heme: Denies bruising or bleeding Neuro:  Denies any headaches, dizziness, paresthesias, shaking  Physical Exam   Vital Signs in last 24 hours: Temp:  [97.7 F (36.5 C)-97.8 F (36.6 C)] 97.7 F (36.5 C) (01/28 0925) Pulse Rate:  [72-77] 77 (01/28 0925) Resp:  [20] 20 (01/28 0925) BP: (118-132)/(83-87) 118/87 (01/28 0925) SpO2:  [95 %] 95 % (01/28 0925) Weight:  [122.5 kg-123.7  kg] 122.5 kg (01/28 0923)   General:   Alert,  Well-developed, well-nourished, pleasant and cooperative in NAD Head:  Normocephalic and atraumatic. Eyes:  Sclera clear, no icterus.   Conjunctiva pink. Ears:  Normal auditory acuity. Mouth:  No deformity or lesions, dentition normal. Neck:  Supple; no masses Lungs:  Clear throughout to auscultation.   No wheezes, crackles, or rhonchi. No acute distress. Heart:  Regular rate and rhythm; no murmurs, clicks, rubs,  or gallops. Abdomen:  Soft, nontender and nondistended. No masses, hepatosplenomegaly or hernias noted. Normal bowel sounds, without guarding, and without rebound.   Msk:  Symmetrical without gross deformities. Normal posture. Extremities:  Without clubbing or edema. Neurologic:  Alert and  oriented x4. Skin:  Intact without significant lesions or rashes. Psych:  Alert and cooperative. Normal mood and affect.  Intake/Output from previous day: No intake/output data recorded. Intake/Output this shift: No intake/output data recorded.   Labs/Studies   Recent Labs Recent Labs    12/07/24 1119 12/08/24 1022  WBC 8.3 10.0  HGB 18.2* 17.4*  HCT 54.9* 53.2*  PLT 264 278   BMET Recent Labs    12/07/24 1119 12/08/24 1022  NA 141 139  K 4.8 6.5*  CL 101 104  CO2 23 27  GLUCOSE 174* 176*  BUN 17 35*  CREATININE 1.64* 1.40*  CALCIUM  10.7* 9.8   LFT Recent Labs    12/07/24 1119 12/08/24 1022  PROT 7.3 7.8  ALBUMIN 4.8 5.1*  AST 36 33  ALT 47* 45*  ALKPHOS 82 76  BILITOT 0.6 0.9   PT/INR Recent Labs    12/08/24 1022  LABPROT 13.7  INR 1.0     Assessment   Elijah Shaffer is a 44 y.o. year old male with past medical history of GERD complicated by long segment nondysplastic Barrett's esophagus, history of diverticulitis and anxiety,  OSA, large hiatal hernia, polyneuropathy, IBS-C who presented to the ED today with c/o coffee ground emesis and dark stools. GI consulted for further evaluation.   Coffee  ground emesis/melena/heme positive stool: Acute onset last night with symptoms persisting into this morning No NSAID use, does not drink or smoke Last EGD in late 2024 as outlined above, large HH, BE Hgb stable, actually elevated on admission BUN elevated  History of MASH, low suspicion for cirrhosis/EVs  At this time, etiology of coffee ground emesis and melena Is unclear. Felt his usual state of health yesterday and was actually seen in outpatient GI clinic with no complaints. Denies NSAID use. Low suspicion for EVs, as he has history of MASH but no evidence of cirrhosis. Last EGD in 2024 with large HH, could be dealing with cameron ulcers secondary to this, cannot rule out MWT, PUD. Recommend remaining NPO, EGD this admission for further evaluation. Unfortuantely, his Potassium was elevated to 6.5, some hemoconcentration present on labs could be contributing though query if this is erroneous in setting of vomiting and normal potassium yesterday. Repeat labs at 1pm today. Timing of EGD will depend on potassium levels, if normalized,may be able to get him down later today for EGD, otherwise, plan for EGD tomorrow. Indications, risks and benefits of procedure discussed in detail with patient. Patient verbalized understanding and is in agreement to proceed with EGD.    Plan / Recommendations   -PPI BID -agree with repeat potassium at 1300 -monitor for overt GI bleeding -Trend H&H -EGD, timing to be determined pending potassium levels  -remain NPO  -anti emetics per hospitalist -continue supportive measures     12/08/2024, 11:15 AM   Janequa Kipnis L. Varick Keys, MSN, APRN, AGNP-C Adult-Gerontology Nurse Practitioner [facility]  at [place]  **addendum: Potassium 4.9 on recheck, will plan for EGD this afternoon    "

## 2024-12-08 NOTE — ED Provider Notes (Signed)
 " [place] EMERGENCY DEPARTMENT AT [facility] Provider Note   CSN: [id number] Arrival date & time: 12/08/24  9094     Patient presents with: Hematemesis   Elijah Shaffer is a 44 y.o. male.   HPI     44 year old patient comes in with chief complaint of bloody emesis and bloody stool.  Patient has previous history of Barrett's esophagitis.  He reports that he started having some chills and sweats overnight.  He felt fine all day yesterday.  When he woke up this morning, he had an episode of emesis that comprised of dark, coffee colored vomitus.  Subsequently he had a bowel movement that was also dark and oily.  Patient then proceeded to go get a shower, but while getting shower he had another round of vomit and also BM that were both dark.  Patient has no previous history of heavy NSAID use, denies any history of alcohol use or smoking or illicit drug use.  He denies any steroid use.  Patient has no previous history of severe GI bleed.   Prior to Admission medications  Medication Sig Start Date End Date Taking? Authorizing Provider  albuterol  (VENTOLIN  HFA) 108 (90 Base) MCG/ACT inhaler Inhale 1-2 puffs into the lungs every 6 (six) hours as needed for wheezing or shortness of breath.    [provider]  amLODipine  (NORVASC ) 5 MG tablet Take 1 tablet (5 mg total) by mouth daily. 08/17/24   Tobie Suzzane POUR, MD  b complex vitamins capsule Take 1 capsule by mouth daily. With D3 gummie    [provider]  Blood Glucose Monitoring Suppl DEVI 1 each by Does not apply route in the morning, at noon, and at bedtime. May substitute to any manufacturer covered by patient's insurance. 10/13/23   Tobie Suzzane POUR, MD  celecoxib  (CELEBREX ) 100 MG capsule Take 1 capsule (100 mg total) by mouth daily. 11/23/24   Tobie Suzzane POUR, MD  cetirizine (ZYRTEC) 10 MG tablet Take 10 mg by mouth daily.    [provider]  Cholecalciferol (VITAMIN D -3 PO) Take by mouth daily at 6  (six) AM.    [provider]  clindamycin  (CLEOCIN  T) 1 % lotion APPLY TO BUMPS ONCE DAILY AFTER SHOWER. Patient taking differently: as needed. APPLY TO BUMPS ONCE DAILY AFTER SHOWER. 07/01/24   Jackquline Sawyer, MD  clobetasol  cream (TEMOVATE ) 0.05 % Spot treat more severe areas once to twice daily until improved. Avoid applying to face, groin, and axilla. Use as directed. Long-term use can cause thinning of the skin. Patient taking differently: as needed. Spot treat more severe areas once to twice daily until improved. Avoid applying to face, groin, and axilla. Use as directed. Long-term use can cause thinning of the skin. 03/17/24   Jackquline Sawyer, MD  Continuous Glucose Sensor (DEXCOM G7 SENSOR) MISC Use it to check blood glucose 3 times before meals, at bedtime and as needed. Patient not taking: Reported on 12/07/2024 04/26/24   Tobie Suzzane POUR, MD  Continuous Glucose Transmitter (DEXCOM G6 TRANSMITTER) MISC Use to check blood sugar daily DX e11.65 Patient not taking: Reported on 12/07/2024 10/23/23   Tobie Suzzane POUR, MD  cyclobenzaprine  (FLEXERIL ) 5 MG tablet Take 1 tablet (5 mg total) by mouth 3 (three) times daily as needed for muscle spasms. 07/23/24   Tobie Suzzane POUR, MD  diphenhydrAMINE -zinc  acetate (BENADRYL  EXTRA STRENGTH) cream Apply 1 application topically 3 (three) times daily as needed for itching. 12/07/21   Paseda, Folashade R, FNP  docusate sodium  (COLACE) 100 MG capsule Take 100 mg by mouth 2 (two) times daily.    [provider]  Dulaglutide  (TRULICITY ) 1.5 MG/0.5ML SOAJ Inject 1.5 mg into the skin once a week. Patient not taking: Reported on 12/07/2024 11/23/24   Tobie Suzzane POUR, MD  DULoxetine  (CYMBALTA ) 30 MG capsule Take 30 mg by mouth 2 (two) times daily. 01/05/24   [provider]  fenofibrate  micronized (LOFIBRA) 134 MG capsule Take 1 capsule (134 mg total) by mouth daily before breakfast. 11/16/24 11/11/25  Anner Alm ORN, MD  fluticasone  (FLONASE ) 50 MCG/ACT  nasal spray Place 1 spray into both nostrils daily. 05/20/24 05/20/25  Assaker, Darrin, MD  gabapentin  (NEURONTIN ) 300 MG capsule Take 300 mg by mouth 3 (three) times daily.    [provider]  Glucose Blood (BLOOD GLUCOSE TEST STRIPS) STRP 1 each by In Vitro route in the morning, at noon, and at bedtime. May substitute to any manufacturer covered by patient's insurance. 10/13/23   Tobie Suzzane POUR, MD  hydrOXYzine  (ATARAX ) 25 MG tablet TAKE 1 TO 2 TABLETS BY MOUTH AT BEDTIME AS NEEDED FOR ITCHING 08/30/24   Jackquline Sawyer, MD  JARDIANCE  10 MG TABS tablet TAKE 1 TABLET BY MOUTH ONCE DAILY BEFORE BREAKFAST 11/30/24   Tobie Suzzane POUR, MD  ketoconazole  (NIZORAL ) 2 % shampoo Massage into scalp 2-3 times a week, let sit several minutes before rinsing. 03/17/24   Jackquline Sawyer, MD  lubiprostone  (AMITIZA ) 24 MCG capsule TAKE 1 CAPSULE BY MOUTH TWICE DAILY WITH A MEAL 09/16/24   Carlan, Chelsea L, NP  mometasone  (ELOCON ) 0.1 % cream Apply topically qd/bid for itchy areas at legs as needed for atopic dermatitis 11/09/24   Jackquline Sawyer, MD  montelukast  (SINGULAIR ) 10 MG tablet TAKE 1 TABLET BY MOUTH AT BEDTIME 07/30/24   Kara Dorn NOVAK, MD  omeprazole  (PRILOSEC) 40 MG capsule Take 1 capsule (40 mg total) by mouth 2 (two) times daily. 12/07/24   Carlan, Chelsea L, NP  ondansetron  (ZOFRAN ) 4 MG tablet Take 1 tablet (4 mg total) by mouth every 6 (six) hours as needed for nausea. 06/04/21   Arloa Suzen RAMAN, NP  rosuvastatin  (CRESTOR ) 40 MG tablet Take 1 tablet (40 mg total) by mouth daily. 04/26/24   Anner Alm ORN, MD  Testosterone  25 MG/2.5GM (1%) GEL Place 5 g onto the skin every morning. 1 packet to each shoulder q am. 06/28/24   Nieves Cough, MD  triamcinolone  cream (KENALOG ) 0.1 %  03/07/22   [provider]  Vitamin D , Ergocalciferol , (DRISDOL ) 1.25 MG (50000 UNIT) CAPS capsule Take 1 capsule by mouth once a week 08/16/24   Tobie Suzzane POUR, MD    Allergies: Loratadine-pseudoephedrine  er and Pollen extract    Review of Systems  All other systems reviewed and are negative.   Updated Vital Signs BP 118/87   Pulse 77   Temp 97.7 F (36.5 C) (Oral)   Resp 20   Ht 6' 3 (1.905 m)   Wt 122.5 kg   SpO2 95%   BMI 33.75 kg/m   Physical Exam Vitals and nursing note reviewed.  Constitutional:      Appearance: He is well-developed.  HENT:     Head: Atraumatic.  Cardiovascular:     Rate and Rhythm: Normal rate.  Pulmonary:     Effort: Pulmonary effort is normal.  Abdominal:     Tenderness: There is abdominal tenderness. There is no guarding or rebound.     Comments: Mild upper quadrant  tenderness  Musculoskeletal:     Cervical back: Neck supple.  Skin:    General: Skin is warm.  Neurological:     Mental Status: He is alert and oriented to person, place, and time.     (all labs ordered are listed, but only abnormal results are displayed) Labs Reviewed  COMPREHENSIVE METABOLIC PANEL WITH GFR - Abnormal; Notable for the following components:      Result Value   Potassium 6.5 (*)    Glucose, Bld 176 (*)    BUN 35 (*)    Creatinine, Ser 1.40 (*)    Albumin 5.1 (*)    ALT 45 (*)    All other components within normal limits  CBC WITH DIFFERENTIAL/PLATELET - Abnormal; Notable for the following components:   RBC 6.04 (*)    Hemoglobin 17.4 (*)    HCT 53.2 (*)    Abs Immature Granulocytes 0.09 (*)    All other components within normal limits  POC OCCULT BLOOD, ED - Abnormal; Notable for the following components:   Fecal Occult Bld POSITIVE (*)    All other components within normal limits  PROTIME-INR  TYPE AND SCREEN    EKG: None  Radiology: No results found.   .Critical Care  Performed by: Charlyn Sora, MD Authorized by: Charlyn Sora, MD   Critical care provider statement:    Critical care time (minutes):  33 (For acute hyperkalemia with potassium over 6)   Critical care was necessary to treat or prevent imminent or life-threatening  deterioration of the following conditions:  Circulatory failure and dehydration   Critical care was time spent personally by me on the following activities:  Development of treatment plan with patient or surrogate, discussions with consultants, evaluation of patient's response to treatment, examination of patient, ordering and review of laboratory studies, ordering and review of radiographic studies, ordering and performing treatments and interventions, pulse oximetry, re-evaluation of patient's condition, review of old charts and obtaining history from patient or surrogate    Medications Ordered in the ED  ondansetron  (ZOFRAN ) injection 4 mg (has no administration in time range)  sodium zirconium cyclosilicate  (LOKELMA ) packet 10 g (has no administration in time range)  sodium bicarbonate  injection 50 mEq (has no administration in time range)  calcium  gluconate 1 g/ 50 mL sodium chloride  IVPB (has no administration in time range)  pantoprazole  (PROTONIX ) injection 40 mg (40 mg Intravenous Given 12/08/24 1032)  sodium chloride  0.9 % bolus 1,000 mL (1,000 mLs Intravenous Bolus 12/08/24 1053)    Clinical Course as of 12/08/24 1106  Wed Dec 08, 2024  1103 Potassium(!!): 6.5 Will give calcium  gluconate, bicarb right now.  He is receiving IV fluids.  EKG ordered. [AN]  1103 Hemoglobin(!): 17.4 Patient is noted to be hemoconcentrated.  He is also orthostatic positive.  1 L of IV fluid ordered now. [AN]  1104 Hemoglobin(!): 17.4 Despite hemoglobin being elevated, patient is guaiac positive and noted to have dark stools.  He had another episode of dark emesis here.  I consulted GI, and spoke with Dr. Shaaron, who is requesting that patient remain NPO.  They will see the patient today. [AN]    Clinical Course User Index [AN] Charlyn Sora, MD                                 Medical Decision Making Amount and/or Complexity of Data Reviewed Labs: ordered. Decision-making details documented in ED  Course.  Risk Prescription drug management.   This patient presents to the ED with chief complaint(s) of bloody /dark stools and emesis with pertinent past medical history of Barrett's esophagitis.The complaint involves an extensive differential diagnosis and also carries with it a high risk of complications and morbidity.    The differential diagnosis considered for this patient includes:  Esophagitis Mallory Weiss tear [place]  Variceal bleeding PUD/Gastritis/ulcers Diverticular bleed Colon cancer Rectal bleed Internal hemorrhoids External hemorrhoids   The initial plan is to get basic labs, Hemoccult exam. Patient has mild abdominal tenderness.  No indication for CT angio.   Additional history obtained: Additional history obtained from family Records reviewed previous GI note, previous upper endoscopy and lower endoscopy results.  Independent labs interpretation:  The following labs were independently interpreted: See workup tab   Treatment and Reassessment: Patient noted to have orthostatic positive.  He has elevated BUN to creatinine ratio and also appears to be hemoconcentrated.  1 L of IV fluid ordered.  Consultation: - Consulted or discussed management/test interpretation with external professional: GI, Dr. Shaaron will see the patient.     Final diagnoses:  Melena  Coffee ground emesis  Acute hyperkalemia  Orthostatic hypotension    ED Discharge Orders     None          Charlyn Sora, MD 12/08/24 1106  "

## 2024-12-08 NOTE — Anesthesia Preprocedure Evaluation (Signed)
"                                    Anesthesia Evaluation  Patient identified by MRN, date of birth, ID band Patient awake    Reviewed: Allergy & Precautions, H&P , NPO status , Patient's Chart, lab work & pertinent test results, reviewed documented beta blocker date and time   Airway Mallampati: II  TM Distance: >3 FB Neck ROM: full    Dental no notable dental hx.    Pulmonary sleep apnea , pneumonia   Pulmonary exam normal breath sounds clear to auscultation       Cardiovascular Exercise Tolerance: Good hypertension,  Rhythm:regular Rate:Normal     Neuro/Psych  PSYCHIATRIC DISORDERS Anxiety Depression     Neuromuscular disease    GI/Hepatic hiatal hernia,GERD  ,,(+) Hepatitis -  Endo/Other  diabetesHypothyroidism    Renal/GU Renal disease  negative genitourinary   Musculoskeletal   Abdominal   Peds  Hematology negative hematology ROS (+)   Anesthesia Other Findings   Reproductive/Obstetrics negative OB ROS                              Anesthesia Physical Anesthesia Plan  ASA: 3 and emergent  Anesthesia Plan: MAC   Post-op Pain Management:    Induction:   PONV Risk Score and Plan: Propofol  infusion  Airway Management Planned:   Additional Equipment:   Intra-op Plan:   Post-operative Plan:   Informed Consent: I have reviewed the patients History and Physical, chart, labs and discussed the procedure including the risks, benefits and alternatives for the proposed anesthesia with the patient or authorized representative who has indicated his/her understanding and acceptance.     Dental Advisory Given  Plan Discussed with: CRNA  Anesthesia Plan Comments:         Anesthesia Quick Evaluation  "

## 2024-12-08 NOTE — ED Triage Notes (Signed)
 Pt came in pov with complaints of dark stools and bloody emesis. This started this morning.

## 2024-12-09 ENCOUNTER — Encounter (INDEPENDENT_AMBULATORY_CARE_PROVIDER_SITE_OTHER): Payer: Self-pay

## 2024-12-09 ENCOUNTER — Telehealth: Payer: Self-pay | Admitting: Gastroenterology

## 2024-12-09 ENCOUNTER — Encounter (HOSPITAL_COMMUNITY): Payer: Self-pay | Admitting: Internal Medicine

## 2024-12-09 ENCOUNTER — Ambulatory Visit: Admitting: Orthopedic Surgery

## 2024-12-09 LAB — GLUCOSE, CAPILLARY
Glucose-Capillary: 110 mg/dL — ABNORMAL HIGH (ref 70–99)
Glucose-Capillary: 116 mg/dL — ABNORMAL HIGH (ref 70–99)
Glucose-Capillary: 97 mg/dL (ref 70–99)

## 2024-12-09 LAB — RENAL FUNCTION PANEL
Albumin: 4.1 g/dL (ref 3.5–5.0)
Anion gap: 13 (ref 5–15)
BUN: 31 mg/dL — ABNORMAL HIGH (ref 6–20)
CO2: 23 mmol/L (ref 22–32)
Calcium: 8.8 mg/dL — ABNORMAL LOW (ref 8.9–10.3)
Chloride: 106 mmol/L (ref 98–111)
Creatinine, Ser: 1.27 mg/dL — ABNORMAL HIGH (ref 0.61–1.24)
GFR, Estimated: >60 mL/min
Glucose, Bld: 106 mg/dL — ABNORMAL HIGH (ref 70–99)
Phosphorus: 3.1 mg/dL (ref 2.5–4.6)
Potassium: 3.8 mmol/L (ref 3.5–5.1)
Sodium: 142 mmol/L (ref 135–145)

## 2024-12-09 LAB — MISC LABCORP TEST (SEND OUT): Labcorp test code: 83935

## 2024-12-09 LAB — CBC
HCT: 43.2 % (ref 39.0–52.0)
Hemoglobin: 14.1 g/dL (ref 13.0–17.0)
MCH: 29.1 pg (ref 26.0–34.0)
MCHC: 32.6 g/dL (ref 30.0–36.0)
MCV: 89.3 fL (ref 80.0–100.0)
Platelets: 217 10*3/uL (ref 150–400)
RBC: 4.84 MIL/uL (ref 4.22–5.81)
RDW: 13.8 % (ref 11.5–15.5)
WBC: 9.4 10*3/uL (ref 4.0–10.5)
nRBC: 0 % (ref 0.0–0.2)

## 2024-12-09 MED ORDER — CLINDAMYCIN PHOSPHATE 1 % EX LOTN: TOPICAL_LOTION | CUTANEOUS | Status: AC | PRN

## 2024-12-09 MED ORDER — CLOBETASOL PROPIONATE 0.05 % EX CREA: TOPICAL_CREAM | CUTANEOUS | Status: AC | PRN

## 2024-12-09 NOTE — TOC CM/SW Note (Signed)
 Transition of Care [facility]) - Inpatient Brief Assessment   Patient Details  Name: Elijah Shaffer MRN: [id number] Date of Birth: Nov 25, 1980  Transition of Care [facility]) CM/SW Contact:    Lucie Lunger, LCSWA Phone Number: 12/09/2024, 8:41 AM   Clinical Narrative: Transition of Care Department [facility]) has reviewed patient and no TOC needs have been identified at this time. We will continue to monitor patient advancement through interdiciplinary progression rounds. If new patient transition needs arise, please place a TOC consult.  Transition of Care Asessment: Insurance and Status: Insurance coverage has been reviewed Patient has primary care physician: Yes Home environment has been reviewed: From home Prior level of function:: Independent Prior/Current Home Services: No current home services Social Drivers of Health Review: SDOH reviewed no interventions necessary Readmission risk has been reviewed: Yes Transition of care needs: no transition of care needs at this time

## 2024-12-09 NOTE — Telephone Encounter (Signed)
 Patient needs hospital follow up in four weeks with [facility] or Dr. Cinderella.

## 2024-12-09 NOTE — Discharge Instructions (Signed)
IMPORTANT INFORMATION: PAY CLOSE ATTENTION   PHYSICIAN DISCHARGE INSTRUCTIONS  Follow with Primary care provider  Patel, Rutwik K, MD  and other consultants as instructed by your Hospitalist Physician  SEEK MEDICAL CARE OR RETURN TO EMERGENCY ROOM IF SYMPTOMS COME BACK, WORSEN OR NEW PROBLEM DEVELOPS   Please note: You were cared for by a hospitalist during your hospital stay. Every effort will be made to forward records to your primary care provider.  You can request that your primary care provider send for your hospital records if they have not received them.  Once you are discharged, your primary care physician will handle any further medical issues. Please note that NO REFILLS for any discharge medications will be authorized once you are discharged, as it is imperative that you return to your primary care physician (or establish a relationship with a primary care physician if you do not have one) for your post hospital discharge needs so that they can reassess your need for medications and monitor your lab values.  Please get a complete blood count and chemistry panel checked by your Primary MD at your next visit, and again as instructed by your Primary MD.  Get Medicines reviewed and adjusted: Please take all your medications with you for your next visit with your Primary MD  Laboratory/radiological data: Please request your Primary MD to go over all hospital tests and procedure/radiological results at the follow up, please ask your primary care provider to get all Hospital records sent to his/her office.  In some cases, they will be blood work, cultures and biopsy results pending at the time of your discharge. Please request that your primary care provider follow up on these results.  If you are diabetic, please bring your blood sugar readings with you to your follow up appointment with primary care.    Please call and make your follow up appointments as soon as possible.    Also Note  the following: If you experience worsening of your admission symptoms, develop shortness of breath, life threatening emergency, suicidal or homicidal thoughts you must seek medical attention immediately by calling 911 or calling your MD immediately  if symptoms less severe.  You must read complete instructions/literature along with all the possible adverse reactions/side effects for all the Medicines you take and that have been prescribed to you. Take any new Medicines after you have completely understood and accpet all the possible adverse reactions/side effects.   Do not drive when taking Pain medications or sleeping medications (Benzodiazepines)  Do not take more than prescribed Pain, Sleep and Anxiety Medications. It is not advisable to combine anxiety,sleep and pain medications without talking with your primary care practitioner  Special Instructions: If you have smoked or chewed Tobacco  in the last 2 yrs please stop smoking, stop any regular Alcohol  and or any Recreational drug use.  Wear Seat belts while driving.  Do not drive if taking any narcotic, mind altering or controlled substances or recreational drugs or alcohol.       

## 2024-12-09 NOTE — Plan of Care (Signed)
" °  Problem: Metabolic: Goal: Ability to maintain appropriate glucose levels will improve Outcome: Progressing   Problem: Skin Integrity: Goal: Risk for impaired skin integrity will decrease Outcome: Progressing   Problem: Education: Goal: Knowledge of General Education information will improve Description: Including pain rating scale, medication(s)/side effects and non-pharmacologic comfort measures Outcome: Progressing   Problem: Clinical Measurements: Goal: Will remain free from infection Outcome: Progressing   Problem: Activity: Goal: Risk for activity intolerance will decrease Outcome: Progressing   Problem: Coping: Goal: Level of anxiety will decrease Outcome: Progressing   Problem: Pain Managment: Goal: General experience of comfort will improve and/or be controlled Outcome: Progressing   Problem: Safety: Goal: Ability to remain free from injury will improve Outcome: Progressing   "

## 2024-12-09 NOTE — Discharge Summary (Signed)
 Physician Discharge Summary  Elijah Shaffer FMW:[id number] DOB: 12-27-1980 DOA: 12/08/2024  PCP: Tobie Suzzane POUR, MD  Admit date: 12/08/2024 Discharge date: 12/09/2024  Admitted From:  Home  Disposition: Home   Recommendations for Outpatient Follow-up:  Follow up with PCP in 2 weeks Follow up with [facility] in 1 month Please obtain BMP/CBC in 2 weeks  Home Health: NA  Discharge Condition: STABLE   CODE STATUS: FULL DIET: soft food, advance diet as tolerated    Brief Hospitalization Summary: Please see all hospital notes, images, labs for full details of the hospitalization. Admission provider HPI:  44 year old male with history of Barrett's esophagus, GAD, stage IIIa CKD, erythrocytosis, testosterone  replacement, GERD, hypertension, chronic fatigue, history of colonic polyps and melena, hyperlipidemia, type 2 diabetes mellitus with neuropathy, orthostatic hypotension, OSA, PSVT, subclinical hypothyroidism and vitamin D  deficiency who presented to the ED today by private vehicle complaining of melanotic stools and hematemesis with coffee ground dark appearing blood that was seen in the ED.  It occurred after he woke up this morning.  He also had a bowel movement that was dark and oily.  He denies abdominal pain.  He was orthostatically positive with hypotension tested in the ED today.  His potassium was 6.5.  His hemoglobin was elevated at 17.4.  His creatinine was 1.40.  His potassium was 6.5.  He was given medical treatments in the ED for this.  He tested guaiac positive.  GI was consulted and requested he be kept n.p.o. and he was given IV fluid hydration and admission was requested for further management.  HOSPITAL COURSE BY LISTED PROBLEMS   Coffee Ground Emesis Melenotic stool Guaiac positive stool -- appreciate GI team consult and recommendations -- Pt tolerated diet and Hg stable today.  DC home with GI recommendations  EGD 12/08/24 WITH DR. SHAARON Long segment Barrett's  epithelium without nodularity -not manipulated                           -Grade 1-2 esophageal varices as described without bleeding stigmata                           -Large hiatal hernia with circumferential Ole                            excoriations straddling the diaphragmatic hiatus.                           -Recent mucosal bleeding in the hernia sac without                            focal lesion identified.                           -Hemospray deployed                           -Changes consistent with portal hypertensive                            gastropathy. Normal duodenal bulb and second  portion of the duodenum.                           - No specimens collected. Recommendation:           - Return patient to hospital ward for ongoing care.                           - Clear liquid diet; advance diet in the a.m if no                            further evidence of GI bleeding..                           - Continue present medications. Twice daily PPI                            therapy.                           -Ideally, he should go for hiatal hernia repair but                            he has evidence of portal hypertension which would                            make that approach a challenge.                           - Repeat H&H tomorrow morning. If stable in a.m.,                            he could probably go home from a GI standpoint.                           - Return to the office for cirrhosis care.                           - Consider adding a nonselective beta-blocker to                            his regimen when he comes back for outpatient GI                            follow-up.   Erythrocytosis--resolved with hydration -- Possibly secondary to testosterone  replacement -- He was clinically dehydrated -- IV fluids ordered -- recheck CBC shows resolution   Orthostatic hypotension -- he was treated with IV fluids as ordered    Stage 3A CKD -- stable, follow   Type 2 diabetes mellitus, controlled, with CKD and neuropathy -- resume home treatments    Hyperkalemia  -- RESOLVED  -- treated medically in ED and resolved now   Discharge Diagnoses:  Principal Problem:   Coffee ground emesis Active Problems:   Orthostatic hypotension   Hyperkalemia   Heme positive stool   GERD (gastroesophageal reflux disease)  Barrett's esophagus   History of colonic polyps   Anxiety   Mixed hyperlipidemia   PSVT (paroxysmal supraventricular tachycardia)   NASH (nonalcoholic steatohepatitis)   OSA (obstructive sleep apnea)   Erythrocytosis   Essential hypertension   Neuropathy   Postural dizziness with presyncope   Chronic kidney disease, stage 3a (HCC)   Chronic fatigue   Type 2 diabetes mellitus with diabetic chronic kidney disease (HCC)   Vitamin D  deficiency   Melena   Subclinical hypothyroidism   Discharge Instructions:  Allergies as of 12/09/2024       Reactions   Loratadine-pseudoephedrine Er Palpitations   Pollen Extract Other (See Comments)   Seasonal allergies        Medication List     STOP taking these medications    celecoxib  100 MG capsule Commonly known as: CeleBREX    Dexcom G6 Transmitter Misc       TAKE these medications    albuterol  108 (90 Base) MCG/ACT inhaler Commonly known as: VENTOLIN  HFA Inhale 1-2 puffs into the lungs every 6 (six) hours as needed for wheezing or shortness of breath.   Blood Glucose Monitoring Suppl Devi 1 each by Does not apply route in the morning, at noon, and at bedtime. May substitute to any manufacturer covered by patient's insurance.   BLOOD GLUCOSE TEST STRIPS Strp 1 each by In Vitro route in the morning, at noon, and at bedtime. May substitute to any manufacturer covered by patient's insurance.   cetirizine 10 MG tablet Commonly known as: ZYRTEC Take 10 mg by mouth daily.   clindamycin  1 % lotion Commonly known as: CLEOCIN  T Apply  topically as needed. APPLY TO BUMPS ONCE DAILY AFTER SHOWER. What changed:  how to take this when to take this reasons to take this   clobetasol  cream 0.05 % Commonly known as: TEMOVATE  Apply topically as needed. Spot treat more severe areas once to twice daily until improved. Avoid applying to face, groin, and axilla. Use as directed. Long-term use can cause thinning of the skin. What changed:  how to take this when to take this reasons to take this   Dexcom G7 Sensor Misc Use it to check blood glucose 3 times before meals, at bedtime and as needed.   diphenhydrAMINE -zinc  acetate cream Commonly known as: Benadryl  Extra Strength Apply 1 application topically 3 (three) times daily as needed for itching.   docusate sodium  100 MG capsule Commonly known as: COLACE Take 100 mg by mouth 2 (two) times daily.   DULoxetine  30 MG capsule Commonly known as: CYMBALTA  Take 30 mg by mouth 2 (two) times daily.   fenofibrate  micronized 134 MG capsule Commonly known as: LOFIBRA Take 1 capsule (134 mg total) by mouth daily before breakfast.   fluticasone  50 MCG/ACT nasal spray Commonly known as: FLONASE  Place 1 spray into both nostrils daily.   gabapentin  300 MG capsule Commonly known as: NEURONTIN  Take 300 mg by mouth 3 (three) times daily.   hydrOXYzine  25 MG tablet Commonly known as: ATARAX  TAKE 1 TO 2 TABLETS BY MOUTH AT BEDTIME AS NEEDED FOR ITCHING   Jardiance  10 MG Tabs tablet Generic drug: empagliflozin  TAKE 1 TABLET BY MOUTH ONCE DAILY BEFORE BREAKFAST   ketoconazole  2 % shampoo Commonly known as: NIZORAL  Massage into scalp 2-3 times a week, let sit several minutes before rinsing.   lubiprostone  24 MCG capsule Commonly known as: AMITIZA  TAKE 1 CAPSULE BY MOUTH TWICE DAILY WITH A MEAL What changed: See the new instructions.   mometasone   0.1 % cream Commonly known as: ELOCON  Apply topically qd/bid for itchy areas at legs as needed for atopic dermatitis   montelukast   10 MG tablet Commonly known as: SINGULAIR  TAKE 1 TABLET BY MOUTH AT BEDTIME   omeprazole  40 MG capsule Commonly known as: PRILOSEC Take 1 capsule (40 mg total) by mouth 2 (two) times daily.   ondansetron  4 MG tablet Commonly known as: ZOFRAN  Take 1 tablet (4 mg total) by mouth every 6 (six) hours as needed for nausea.   rosuvastatin  40 MG tablet Commonly known as: CRESTOR  Take 1 tablet (40 mg total) by mouth daily.   Testosterone  25 MG/2.5GM (1%) Gel Place 5 g onto the skin every morning. 1 packet to each shoulder q am.   triamcinolone  cream 0.1 % Commonly known as: KENALOG    Trulicity  1.5 MG/0.5ML Soaj Generic drug: Dulaglutide  Inject 1.5 mg into the skin once a week.   Vitamin D  (Ergocalciferol ) 1.25 MG (50000 UNIT) Caps capsule Commonly known as: DRISDOL  Take 1 capsule by mouth once a week        Follow-up Information     [facility] Schedule an appointment as soon as possible for a visit in 1 month(s).   Why: Hospital Follow Up Contact information: [address] [place]  [address] [phone]        Tobie Suzzane POUR, MD. Schedule an appointment as soon as possible for a visit in 2 week(s).   Specialty: Internal Medicine Why: Hospital Follow Up Contact information: [address] [place] [address] [phone]                Allergies[1] Allergies as of 12/09/2024       Reactions   Loratadine-pseudoephedrine Er Palpitations   Pollen Extract Other (See Comments)   Seasonal allergies        Medication List     STOP taking these medications    celecoxib  100 MG capsule Commonly known as: CeleBREX    Dexcom G6 Transmitter Misc       TAKE these medications    albuterol  108 (90 Base) MCG/ACT inhaler Commonly known as: VENTOLIN  HFA Inhale 1-2 puffs into the lungs every 6 (six) hours as needed for wheezing or shortness of breath.   Blood Glucose Monitoring Suppl Devi 1 each by Does not apply route in  the morning, at noon, and at bedtime. May substitute to any manufacturer covered by patient's insurance.   BLOOD GLUCOSE TEST STRIPS Strp 1 each by In Vitro route in the morning, at noon, and at bedtime. May substitute to any manufacturer covered by patient's insurance.   cetirizine 10 MG tablet Commonly known as: ZYRTEC Take 10 mg by mouth daily.   clindamycin  1 % lotion Commonly known as: CLEOCIN  T Apply topically as needed. APPLY TO BUMPS ONCE DAILY AFTER SHOWER. What changed:  how to take this when to take this reasons to take this   clobetasol  cream 0.05 % Commonly known as: TEMOVATE  Apply topically as needed. Spot treat more severe areas once to twice daily until improved. Avoid applying to face, groin, and axilla. Use as directed. Long-term use can cause thinning of the skin. What changed:  how to take this when to take this reasons to take this   Dexcom G7 Sensor Misc Use it to check blood glucose 3 times before meals, at bedtime and as needed.   diphenhydrAMINE -zinc  acetate cream Commonly known as: Benadryl  Extra Strength Apply 1 application topically 3 (three) times daily as needed  for itching.   docusate sodium  100 MG capsule Commonly known as: COLACE Take 100 mg by mouth 2 (two) times daily.   DULoxetine  30 MG capsule Commonly known as: CYMBALTA  Take 30 mg by mouth 2 (two) times daily.   fenofibrate  micronized 134 MG capsule Commonly known as: LOFIBRA Take 1 capsule (134 mg total) by mouth daily before breakfast.   fluticasone  50 MCG/ACT nasal spray Commonly known as: FLONASE  Place 1 spray into both nostrils daily.   gabapentin  300 MG capsule Commonly known as: NEURONTIN  Take 300 mg by mouth 3 (three) times daily.   hydrOXYzine  25 MG tablet Commonly known as: ATARAX  TAKE 1 TO 2 TABLETS BY MOUTH AT BEDTIME AS NEEDED FOR ITCHING   Jardiance  10 MG Tabs tablet Generic drug: empagliflozin  TAKE 1 TABLET BY MOUTH ONCE DAILY BEFORE BREAKFAST    ketoconazole  2 % shampoo Commonly known as: NIZORAL  Massage into scalp 2-3 times a week, let sit several minutes before rinsing.   lubiprostone  24 MCG capsule Commonly known as: AMITIZA  TAKE 1 CAPSULE BY MOUTH TWICE DAILY WITH A MEAL What changed: See the new instructions.   mometasone  0.1 % cream Commonly known as: ELOCON  Apply topically qd/bid for itchy areas at legs as needed for atopic dermatitis   montelukast  10 MG tablet Commonly known as: SINGULAIR  TAKE 1 TABLET BY MOUTH AT BEDTIME   omeprazole  40 MG capsule Commonly known as: PRILOSEC Take 1 capsule (40 mg total) by mouth 2 (two) times daily.   ondansetron  4 MG tablet Commonly known as: ZOFRAN  Take 1 tablet (4 mg total) by mouth every 6 (six) hours as needed for nausea.   rosuvastatin  40 MG tablet Commonly known as: CRESTOR  Take 1 tablet (40 mg total) by mouth daily.   Testosterone  25 MG/2.5GM (1%) Gel Place 5 g onto the skin every morning. 1 packet to each shoulder q am.   triamcinolone  cream 0.1 % Commonly known as: KENALOG    Trulicity  1.5 MG/0.5ML Soaj Generic drug: Dulaglutide  Inject 1.5 mg into the skin once a week.   Vitamin D  (Ergocalciferol ) 1.25 MG (50000 UNIT) Caps capsule Commonly known as: DRISDOL  Take 1 capsule by mouth once a week        Procedures/Studies: No results found.   Subjective: Pt reporting some residual black stool (small amount) but overall tolerating diet and feeling well.    Discharge Exam: Vitals:   12/09/24 0205 12/09/24 0429  BP: 117/76 114/71  Pulse: 70 70  Resp: 18   Temp: 98 F (36.7 C) 97.6 F (36.4 C)  SpO2: 93% 92%   Vitals:   12/08/24 1640 12/08/24 1956 12/09/24 0205 12/09/24 0429  BP: (!) 139/95 (!) 123/92 117/76 114/71  Pulse: 86 (!) 107 70 70  Resp: 18 20 18    Temp: 98 F (36.7 C) 98.7 F (37.1 C) 98 F (36.7 C) 97.6 F (36.4 C)  TempSrc: Oral Oral Oral Oral  SpO2: 95% 98% 93% 92%  Weight:      Height:        General: Pt is alert,  awake, not in acute distress Cardiovascular: RRR, S1/S2 +, no rubs, no gallops Respiratory: CTA bilaterally, no wheezing, no rhonchi Abdominal: Soft, NT, ND, bowel sounds + Extremities: no edema, no cyanosis   The results of significant diagnostics from this hospitalization (including imaging, microbiology, ancillary and laboratory) are listed below for reference.     Microbiology: No results found for this or any previous visit (from the past 240 hours).   Labs: BNP (last  3 results) No results for input(s): BNP in the last 8760 hours. Basic Metabolic Panel: Recent Labs  Lab 12/07/24 1119 12/08/24 1022 12/08/24 1329 12/09/24 0403  NA 141 139 143 142  K 4.8 6.5* 4.9 3.8  CL 101 104 107 106  CO2 23 27 22 23   GLUCOSE 174* 176* 98 106*  BUN 17 35* 34* 31*  CREATININE 1.64* 1.40* 1.14 1.27*  CALCIUM  10.7* 9.8 9.4 8.8*  PHOS  --   --   --  3.1   Liver Function Tests: Recent Labs  Lab 12/07/24 1119 12/08/24 1022 12/09/24 0403  AST 36 33  --   ALT 47* 45*  --   ALKPHOS 82 76  --   BILITOT 0.6 0.9  --   PROT 7.3 7.8  --   ALBUMIN 4.8 5.1* 4.1   No results for input(s): LIPASE, AMYLASE in the last 168 hours. No results for input(s): AMMONIA in the last 168 hours. CBC: Recent Labs  Lab 12/07/24 1119 12/08/24 1022 12/09/24 0403  WBC 8.3 10.0 9.4  NEUTROABS 4.1 6.6  --   HGB 18.2* 17.4* 14.1  HCT 54.9* 53.2* 43.2  MCV 89 88.1 89.3  PLT 264 278 217   Cardiac Enzymes: No results for input(s): CKTOTAL, CKMB, CKMBINDEX, TROPONINI in the last 168 hours. BNP: Invalid input(s): POCBNP CBG: Recent Labs  Lab 12/08/24 1607 12/08/24 1935 12/09/24 0028 12/09/24 0426 12/09/24 0724  GLUCAP 97 103* 110* 97 116*   D-Dimer No results for input(s): DDIMER in the last 72 hours. Hgb A1c Recent Labs    12/07/24 1119  HGBA1C 6.7*   Lipid Profile Recent Labs    12/07/24 1119  CHOL 119  HDL 33*  LDLCALC 43  TRIG 715*  CHOLHDL 3.6   Thyroid   function studies No results for input(s): TSH, T4TOTAL, T3FREE, THYROIDAB in the last 72 hours.  Invalid input(s): FREET3 Anemia work up No results for input(s): VITAMINB12, FOLATE, FERRITIN, TIBC, IRON, RETICCTPCT in the last 72 hours. Urinalysis    Component Value Date/Time   COLORURINE YELLOW 08/08/2023 1322   APPEARANCEUR Clear 06/28/2024 1351   LABSPEC 1.017 08/08/2023 1322   PHURINE 6.0 08/08/2023 1322   GLUCOSEU 3+ (A) 06/28/2024 1351   HGBUR NEGATIVE 08/08/2023 1322   BILIRUBINUR Negative 06/28/2024 1351   KETONESUR NEGATIVE 08/08/2023 1322   PROTEINUR Trace 06/28/2024 1351   PROTEINUR NEGATIVE 08/08/2023 1322   UROBILINOGEN 0.2 04/18/2010 2028   NITRITE Negative 06/28/2024 1351   NITRITE NEGATIVE 08/08/2023 1322   LEUKOCYTESUR Negative 06/28/2024 1351   LEUKOCYTESUR NEGATIVE 08/08/2023 1322   Sepsis Labs Recent Labs  Lab 12/07/24 1119 12/08/24 1022 12/09/24 0403  WBC 8.3 10.0 9.4   Microbiology No results found for this or any previous visit (from the past 240 hours).  Time coordinating discharge: 34 mins  SIGNED:  Afton Louder, MD  [facility] 12/09/2024, 9:57 AM How to contact the [facility] Attending or Consulting provider 7A - 7P or covering provider during after hours 7P -7A, for this patient?  Check the care team in [facility] and look for a) attending/consulting [facility] provider listed and b) the [facility] team listed Log into [email or web address] and use [facility] universal password to access. If you do not have the password, please contact the hospital operator. Locate the [facility] provider you are looking for under [facility] and page to a number that you can be directly reached. If you still have difficulty reaching the provider, please page the [facility] (Director on Call) for  the Hospitalists listed on amion for assistance.     [1]  Allergies Allergen Reactions   Loratadine-Pseudoephedrine Er Palpitations   Pollen Extract Other (See  Comments)    Seasonal allergies

## 2024-12-10 ENCOUNTER — Encounter: Payer: Self-pay | Admitting: Internal Medicine

## 2024-12-10 ENCOUNTER — Telehealth: Payer: Self-pay

## 2024-12-10 ENCOUNTER — Other Ambulatory Visit: Payer: Self-pay | Admitting: Internal Medicine

## 2024-12-10 DIAGNOSIS — E1122 Type 2 diabetes mellitus with diabetic chronic kidney disease: Secondary | ICD-10-CM

## 2024-12-10 MED ORDER — EMPAGLIFLOZIN 10 MG PO TABS: 10.0000 mg | ORAL_TABLET | Freq: Every day | ORAL | 3 refills | Status: AC

## 2024-12-10 NOTE — Telephone Encounter (Signed)
 Copied from CRM #[id number]. Topic: Clinical - Medication Question >> Dec 10, 2024 11:01 AM Avram MATSU wrote: Reason for CRM: patient stated his insurance does not pay for Continuous Glucose Sensor (DEXCOM G7 SENSOR) MISC [[id number]] and stated the provider has to call in something different that can be covered.

## 2024-12-10 NOTE — Telephone Encounter (Signed)
 done

## 2024-12-12 NOTE — Telephone Encounter (Signed)
 Call pt to reschedule appt, or change to virtual.  I will not be available for in person visit until 2/11.

## 2024-12-13 ENCOUNTER — Other Ambulatory Visit: Payer: Self-pay | Admitting: Internal Medicine

## 2024-12-13 DIAGNOSIS — R112 Nausea with vomiting, unspecified: Secondary | ICD-10-CM

## 2024-12-13 MED ORDER — ONDANSETRON HCL 4 MG PO TABS: 4.0000 mg | ORAL_TABLET | Freq: Four times a day (QID) | ORAL | 0 refills | Status: AC | PRN

## 2024-12-14 ENCOUNTER — Telehealth: Payer: Self-pay | Admitting: Pharmacy Technician

## 2024-12-14 ENCOUNTER — Other Ambulatory Visit (HOSPITAL_COMMUNITY): Payer: Self-pay

## 2024-12-14 ENCOUNTER — Ambulatory Visit: Payer: Self-pay | Admitting: Internal Medicine

## 2024-12-14 ENCOUNTER — Telehealth (INDEPENDENT_AMBULATORY_CARE_PROVIDER_SITE_OTHER): Payer: Self-pay | Admitting: Gastroenterology

## 2024-12-14 ENCOUNTER — Telehealth: Payer: Self-pay

## 2024-12-14 ENCOUNTER — Encounter: Payer: Self-pay | Admitting: Neurology

## 2024-12-14 ENCOUNTER — Encounter: Payer: Self-pay | Admitting: Internal Medicine

## 2024-12-14 ENCOUNTER — Encounter (INDEPENDENT_AMBULATORY_CARE_PROVIDER_SITE_OTHER): Payer: Self-pay

## 2024-12-14 VITALS — BP 100/69 | HR 89 | Ht 75.0 in | Wt 271.0 lb

## 2024-12-14 DIAGNOSIS — E1140 Type 2 diabetes mellitus with diabetic neuropathy, unspecified: Secondary | ICD-10-CM

## 2024-12-14 DIAGNOSIS — E1122 Type 2 diabetes mellitus with diabetic chronic kidney disease: Secondary | ICD-10-CM

## 2024-12-14 DIAGNOSIS — K3189 Other diseases of stomach and duodenum: Secondary | ICD-10-CM | POA: Insufficient documentation

## 2024-12-14 DIAGNOSIS — K219 Gastro-esophageal reflux disease without esophagitis: Secondary | ICD-10-CM

## 2024-12-14 DIAGNOSIS — E11649 Type 2 diabetes mellitus with hypoglycemia without coma: Secondary | ICD-10-CM

## 2024-12-14 DIAGNOSIS — K921 Melena: Secondary | ICD-10-CM

## 2024-12-14 DIAGNOSIS — K449 Diaphragmatic hernia without obstruction or gangrene: Secondary | ICD-10-CM

## 2024-12-14 MED ORDER — FREESTYLE LIBRE 3 PLUS SENSOR MISC: 3 refills | Status: AC

## 2024-12-14 MED ORDER — TRULICITY 1.5 MG/0.5ML ~~LOC~~ SOAJ: 1.5000 mg | SUBCUTANEOUS | 3 refills | Status: AC

## 2024-12-14 MED ORDER — OMEPRAZOLE 40 MG PO CPDR: 40.0000 mg | DELAYED_RELEASE_CAPSULE | Freq: Two times a day (BID) | ORAL | 5 refills | Status: DC

## 2024-12-14 NOTE — Telephone Encounter (Signed)
 Spoke with pt over phone.

## 2024-12-14 NOTE — Progress Notes (Unsigned)
 "  Established Patient Office Visit  Subjective:  Patient ID: Elijah Shaffer, male    DOB: 05/15/1981  Age: 44 y.o. MRN: [id number]  CC:  Chief Complaint  Patient presents with   Hospitalization Follow-up    HPI Elijah Shaffer is a 44 y.o. male with past medical history of HTN, type 2 DM, GERD, eczema, anxiety and chronic dyspnea who presents for follow-up after recent hospitalization.  He presented complaining of melanotic stools and hematemesis with coffee ground dark appearing blood that was seen in the ED. He was orthostatically positive with hypotension tested in the ED today. His potassium was 6.5. His hemoglobin was elevated at 17.4. His creatinine was 1.40. He was given medical treatments in the ED for this. He tested guaiac positive. GI was consulted and requested he be kept n.p.o. and he was given IV fluid hydration.  EGD showed Grade 1-2 esophageal varices as described without bleeding stigmata. Large hiatal hernia with circumferential Cameron excoriations straddling the diaphragmatic hiatus. Recent mucosal bleeding in the hernia sac without focal lesion identified. Hemospray deployed.   Past Medical History:  Diagnosis Date   Abdominal hernia    2025   Acute renal injury 06/28/2017   Anxiety    Aspiration pneumonia of right upper lobe due to gastric secretions (HCC)    Diabetes mellitus without complication (HCC)    Diverticulitis    GERD (gastroesophageal reflux disease) 01/08/2018   Hiatal hernia    2022   Hypertension    Prediabetes    Seasonal allergies    Sleep apnea    Syncope 06/26/2021    Past Surgical History:  Procedure Laterality Date   BIOPSY  03/18/2021   Procedure: BIOPSY;  Surgeon: Elijah Angelia Sieving, MD;  [facility]: [facility];  Service: Gastroenterology;;  esophageal at Z-line   BIOPSY  05/23/2021   Procedure: BIOPSY;  Surgeon: Elijah Angelia Sieving, MD;  [facility]: [facility];  Service: Gastroenterology;;   BIOPSY  08/19/2023    Procedure: BIOPSY;  Surgeon: Elijah Angelia Sieving, MD;  [facility]: [facility];  Service: Gastroenterology;;   CARDIOPULMONARY EXERCISE TEST (CPX)  01/09/2022   Interpretation limited by submaximal effort.  Severe functional impairment when compared to max sedentary norms.  No cardiopulmonary limitations.  Noted tremor and generalized weakness that lead to premature exercise termination-consider neuromuscular evaluation.   COLONOSCOPY N/A 09/22/2017   Procedure: COLONOSCOPY;  Surgeon: Elijah Claudis PENNER, MD;  Location: [facility];  Service: Endoscopy;  Laterality: N/A;  9:55   COLONOSCOPY WITH PROPOFOL  N/A 05/23/2021   Procedure: COLONOSCOPY WITH PROPOFOL ;  Surgeon: Elijah Angelia Sieving, MD;  [facility]: [facility];  Service: Gastroenterology;  Laterality: N/A;  7:30   DRUG INDUCED ENDOSCOPY N/A 10/25/2024   Procedure: DRUG INDUCED SLEEP ENDOSCOPY;  Surgeon: Elijah Eldora NOVAK, MD;  Location: [facility];  Service: ENT;  Laterality: N/A;   ESOPHAGOGASTRODUODENOSCOPY N/A 12/08/2024   Procedure: EGD (ESOPHAGOGASTRODUODENOSCOPY);  Surgeon: Elijah Lamar HERO, MD;  [facility];  Service: Endoscopy;  Laterality: N/A;   ESOPHAGOGASTRODUODENOSCOPY (EGD) WITH PROPOFOL  N/A 03/18/2021   Procedure: ESOPHAGOGASTRODUODENOSCOPY (EGD) WITH PROPOFOL ;  Surgeon: Elijah Angelia Sieving, MD;  [facility];  Service: Gastroenterology;  Laterality: N/A;   ESOPHAGOGASTRODUODENOSCOPY (EGD) WITH PROPOFOL  N/A 05/23/2021   Procedure: ESOPHAGOGASTRODUODENOSCOPY (EGD) WITH PROPOFOL ;  Surgeon: Elijah Angelia Sieving, MD;  [facility];  Service: Gastroenterology;  Laterality: N/A;   ESOPHAGOGASTRODUODENOSCOPY (EGD) WITH PROPOFOL  N/A 08/19/2023   Procedure: ESOPHAGOGASTRODUODENOSCOPY (EGD) WITH PROPOFOL ;  Surgeon: Elijah Flavors, Toribio, MD;  [facility];  Service: Gastroenterology;  Laterality: N/A;  11:45AM;ASA 3   ESOPHAGOGASTRODUODENOSCOPY (EGD) WITH  PROPOFOL  N/A 10/08/2023   Procedure: ESOPHAGOGASTRODUODENOSCOPY (EGD) WITH PROPOFOL ;  Surgeon: Elijah Flavors Toribio, MD;  [facility];  Service: Gastroenterology;  Laterality: N/A;  1:45PM;ASA 1-2   GIVENS CAPSULE STUDY N/A 10/08/2023   Procedure: GIVENS CAPSULE STUDY;  Surgeon: Elijah Flavors Toribio, MD;  [facility]: [facility];  Service: Gastroenterology;  Laterality: N/A;  1:45PM;ASA 1-2   POLYPECTOMY  09/22/2017   Procedure: POLYPECTOMY;  Surgeon: Elijah Claudis PENNER, MD;  [facility];  Service: Endoscopy;;   RIGHT/LEFT HEART CATH AND CORONARY ANGIOGRAPHY N/A 12/12/2021   Procedure: RIGHT/LEFT HEART CATH AND CORONARY ANGIOGRAPHY;  Surgeon: Elijah Shaffer SAUNDERS, MD;  Location: [facility];  Service: Cardiovascular:: Normal Coronaries & Hemodynamics.  EF 50-55%. RA = 4 mmHg, RV = 23/4; PA = 22/6 (14) & PCW = 7; Ao sat = 95%; PA sat = 76%, 76%& SVC sat = 74%Fick cardiac output/index = 7.1/3.0; PVR = 1.0 WU   TRANSTHORACIC ECHOCARDIOGRAM  02/24/2021   EF 60 to 65%.  Normal LV function.  Normal valves.  Mild RV dilation with normal function.   ZIO PATCH EVENT MONITOR  01/2022   Sinus rhythm with heart rate range 24 to 159 bpm, 1  AVB and Wenckebach block noted along with rare PACs and PVCs.  No arrhythmias.    Family History  Problem Relation Age of Onset   Healthy Mother    Cervical cancer Mother    Cancer Father    Colon cancer Maternal Grandmother    Colon cancer Maternal Grandfather     Social History   Socioeconomic History   Marital status: Single    Spouse name: Not on file   Number of children: Not on file   Years of education: Not on file   Highest education level: Not on file  Occupational History   Not on file  Tobacco Use   Smoking status: Never    Passive exposure: Past   Smokeless tobacco: Never  Vaping Use   Vaping status: Never Used  Substance and Sexual Activity   Alcohol use: Not Currently    Comment: occasionally   Drug  use: No   Sexual activity: Not on file  Other Topics Concern   Not on file  Social History Narrative   He currently has his own lawn care landscaping service. He previous had 80 yards before he got sick and since decreased. He works around a interior and spatial designer. One of the chemical names is roundup.    Social Drivers of Health   Tobacco Use: Low Risk (12/14/2024)   Patient History    Smoking Tobacco Use: Never    Smokeless Tobacco Use: Never    Passive Exposure: Past  Financial Resource Strain: Not on file  Food Insecurity: No Food Insecurity (12/08/2024)   Epic    Worried About Programme Researcher, Broadcasting/film/video in the Last Year: Never true    Ran Out of Food in the Last Year: Never true  Transportation Needs: No Transportation Needs (12/08/2024)   Epic    Lack of Transportation (Medical): No    Lack of Transportation (Non-Medical): No  Physical Activity: Insufficiently Active (03/29/2022)   Exercise Vital Sign    Days of Exercise per Week: 2 days    Minutes of Exercise per Session: 10 min  Stress: Stress Concern Present (05/07/2022)  [employer] of Occupational [employer] - Occupational Stress Questionnaire    Feeling of Stress : To some extent  Social Connections: Not on file  Intimate Partner Violence: Not At Risk (12/08/2024)   Epic    Fear of Current or Ex-Partner: No    Emotionally Abused: No    Physically Abused: No    Sexually Abused: No  Depression (PHQ2-9): Low Risk (12/14/2024)   Depression (PHQ2-9)    PHQ-2 Score: 0  Alcohol Screen: Not on file  Housing: Low Risk (12/08/2024)   Epic    Unable to Pay for Housing in the Last Year: No    Number of Times Moved in the Last Year: 0    Homeless in the Last Year: No  Utilities: Not At Risk (12/08/2024)   Epic    Threatened with loss of utilities: No  Health Literacy: Adequate Health Literacy (06/20/2023)   B1300 Health Literacy    Frequency of need for help with medical instructions: Never    Outpatient Medications Prior to Visit   Medication Sig Dispense Refill   albuterol  (VENTOLIN  HFA) 108 (90 Base) MCG/ACT inhaler Inhale 1-2 puffs into the lungs every 6 (six) hours as needed for wheezing or shortness of breath.     Blood Glucose Monitoring Suppl DEVI 1 each by Does not apply route in the morning, at noon, and at bedtime. May substitute to any manufacturer covered by patient's insurance. 1 each 0   cetirizine (ZYRTEC) 10 MG tablet Take 10 mg by mouth daily.     clindamycin  (CLEOCIN  T) 1 % lotion Apply topically as needed. APPLY TO BUMPS ONCE DAILY AFTER SHOWER.     clobetasol  cream (TEMOVATE ) 0.05 % Apply topically as needed. Spot treat more severe areas once to twice daily until improved. Avoid applying to face, groin, and axilla. Use as directed. Long-term use can cause thinning of the skin.     Continuous Glucose Sensor (DEXCOM G7 SENSOR) MISC Use it to check blood glucose 3 times before meals, at bedtime and as needed. 3 each 5   diphenhydrAMINE -zinc  acetate (BENADRYL  EXTRA STRENGTH) cream Apply 1 application topically 3 (three) times daily as needed for itching. 28.4 g 0   docusate sodium  (COLACE) 100 MG capsule Take 100 mg by mouth 2 (two) times daily.     Dulaglutide  (TRULICITY ) 1.5 MG/0.5ML SOAJ Inject 1.5 mg into the skin once a week. 2 mL 3   DULoxetine  (CYMBALTA ) 30 MG capsule Take 30 mg by mouth 2 (two) times daily.     empagliflozin  (JARDIANCE ) 10 MG TABS tablet Take 1 tablet (10 mg total) by mouth daily before breakfast. 90 tablet 3   fenofibrate  micronized (LOFIBRA) 134 MG capsule Take 1 capsule (134 mg total) by mouth daily before breakfast. 90 capsule 3   fluticasone  (FLONASE ) 50 MCG/ACT nasal spray Place 1 spray into both nostrils daily. 100 mL 2   gabapentin  (NEURONTIN ) 300 MG capsule Take 300 mg by mouth 3 (three) times daily.     Glucose Blood (BLOOD GLUCOSE TEST STRIPS) STRP 1 each by In Vitro route in the morning, at noon, and at bedtime. May substitute to any manufacturer covered by patient's  insurance. 100 strip 1   hydrOXYzine  (ATARAX ) 25 MG tablet TAKE 1 TO 2 TABLETS BY MOUTH AT BEDTIME AS NEEDED FOR ITCHING 60 tablet 1   ketoconazole  (NIZORAL ) 2 % shampoo Massage into scalp 2-3 times a week, let sit several minutes before rinsing. 120 mL 11   lubiprostone  (AMITIZA ) 24 MCG capsule  TAKE 1 CAPSULE BY MOUTH TWICE DAILY WITH A MEAL (Patient taking differently: Take 24 mcg by mouth 2 (two) times daily with a meal.) 180 capsule 0   mometasone  (ELOCON ) 0.1 % cream Apply topically qd/bid for itchy areas at legs as needed for atopic dermatitis 45 g 3   montelukast  (SINGULAIR ) 10 MG tablet TAKE 1 TABLET BY MOUTH AT BEDTIME 90 tablet 0   omeprazole  (PRILOSEC) 40 MG capsule Take 1 capsule (40 mg total) by mouth 2 (two) times daily. 60 capsule 3   ondansetron  (ZOFRAN ) 4 MG tablet Take 1 tablet (4 mg total) by mouth every 6 (six) hours as needed for nausea. 20 tablet 0   rosuvastatin  (CRESTOR ) 40 MG tablet Take 1 tablet (40 mg total) by mouth daily. 90 tablet 3   Testosterone  25 MG/2.5GM (1%) GEL Place 5 g onto the skin every morning. 1 packet to each shoulder q am. 150 g 5   triamcinolone  cream (KENALOG ) 0.1 %      Vitamin D , Ergocalciferol , (DRISDOL ) 1.25 MG (50000 UNIT) CAPS capsule Take 1 capsule by mouth once a week 12 capsule 1   No facility-administered medications prior to visit.    Allergies[1]  ROS Review of Systems    Objective:    Physical Exam  BP 100/69   Pulse 89   Ht 6' 3 (1.905 m)   Wt 271 lb (122.9 kg)   SpO2 97%   BMI 33.87 kg/m  Wt Readings from Last 3 Encounters:  12/14/24 271 lb (122.9 kg)  12/08/24 264 lb 8.8 oz (120 kg)  12/07/24 272 lb 12.8 oz (123.7 kg)    Lab Results  Component Value Date   TSH 1.280 04/07/2024   Lab Results  Component Value Date   WBC 9.4 12/09/2024   HGB 14.1 12/09/2024   HCT 43.2 12/09/2024   MCV 89.3 12/09/2024   PLT 217 12/09/2024   Lab Results  Component Value Date   NA 142 12/09/2024   K 3.8 12/09/2024   CO2  23 12/09/2024   GLUCOSE 106 (H) 12/09/2024   BUN 31 (H) 12/09/2024   CREATININE 1.27 (H) 12/09/2024   BILITOT 0.9 12/08/2024   ALKPHOS 76 12/08/2024   AST 33 12/08/2024   ALT 45 (H) 12/08/2024   PROT 7.8 12/08/2024   ALBUMIN 4.1 12/09/2024   CALCIUM  8.8 (L) 12/09/2024   ANIONGAP 13 12/09/2024   EGFR 53 (L) 12/07/2024   GFR 68.05 10/02/2021   Lab Results  Component Value Date   CHOL 119 12/07/2024   Lab Results  Component Value Date   HDL 33 (L) 12/07/2024   Lab Results  Component Value Date   LDLCALC 43 12/07/2024   Lab Results  Component Value Date   TRIG 284 (H) 12/07/2024   Lab Results  Component Value Date   CHOLHDL 3.6 12/07/2024   Lab Results  Component Value Date   HGBA1C 6.7 (H) 12/07/2024      Assessment & Plan:   Problem List Items Addressed This Visit   None   No orders of the defined types were placed in this encounter.   Follow-up: No follow-ups on file.    Suzzane MARLA Blanch, MD    [1]  Allergies Allergen Reactions   Loratadine-Pseudoephedrine Er Palpitations   Pollen Extract Other (See Comments)    Seasonal allergies   "

## 2024-12-14 NOTE — Telephone Encounter (Signed)
 Pt last seen by [facility] on 12/07/2024 and came back to front desk this morning talking about being in the hospital and needing surgery. He was wanting another OV so I have him down to see Chelsea again on 2/5 at 145pm. I told him I would have nurse call him to get more information of what is going on with him and if he needs to keep this OV or not. Please advise. [phone]

## 2024-12-14 NOTE — Telephone Encounter (Signed)
 Per my chart message: I would like more info on hiatal hernia surgery since it has grown. What my opinions are and how to proceed. Thank you for taking the time to hear about my concerns.  Pt states he was suppose to follow up with [facility] anyway about being in the hospital. Advised pt that per AVS from hospital it said to follow up with GI in one month. Does pt need Thursday appt? Please advise. Thank you.

## 2024-12-14 NOTE — Telephone Encounter (Signed)
 Left message to return call

## 2024-12-15 ENCOUNTER — Encounter: Payer: Self-pay | Admitting: Podiatry

## 2024-12-15 ENCOUNTER — Encounter: Admitting: Nutrition

## 2024-12-15 ENCOUNTER — Encounter (INDEPENDENT_AMBULATORY_CARE_PROVIDER_SITE_OTHER): Payer: Self-pay

## 2024-12-15 MED ORDER — PANTOPRAZOLE SODIUM 40 MG PO TBEC: 40.0000 mg | DELAYED_RELEASE_TABLET | Freq: Two times a day (BID) | ORAL | 5 refills | Status: AC

## 2024-12-15 NOTE — Telephone Encounter (Signed)
 Per [facility], please have pt keep appt on 12/16/24

## 2024-12-15 NOTE — Telephone Encounter (Signed)
 Patient advised

## 2024-12-16 ENCOUNTER — Telehealth (INDEPENDENT_AMBULATORY_CARE_PROVIDER_SITE_OTHER): Payer: Self-pay

## 2024-12-16 ENCOUNTER — Encounter (INDEPENDENT_AMBULATORY_CARE_PROVIDER_SITE_OTHER): Payer: Self-pay | Admitting: Gastroenterology

## 2024-12-16 ENCOUNTER — Ambulatory Visit (INDEPENDENT_AMBULATORY_CARE_PROVIDER_SITE_OTHER): Admitting: Gastroenterology

## 2024-12-16 VITALS — BP 126/79 | HR 91 | Temp 97.1°F | Ht 75.0 in | Wt 272.4 lb

## 2024-12-16 DIAGNOSIS — K7469 Other cirrhosis of liver: Secondary | ICD-10-CM

## 2024-12-16 MED ORDER — CARVEDILOL 3.125 MG PO TABS: 3.1250 mg | ORAL_TABLET | Freq: Two times a day (BID) | ORAL | 3 refills | Status: AC

## 2024-12-16 NOTE — Telephone Encounter (Signed)
 Spoke with patient in the office, scheduled ultrasound appt for 12/21/2024 at 9:30am. Gave patient appt letter.

## 2024-12-16 NOTE — Patient Instructions (Signed)
-  start coreg  3.125mg  twice daily  -liver US  -liver labs -referral to [facility] for evaluation of hernia repair  -make me aware of swelling/distention in your abdomen -continue with protonix  40mg  twice daily -Reduce salt intake to <2 g per day - Can take Tylenol  max of 2 g per day (650 mg q8h) for pain - Avoid NSAIDs for pain - Avoid eating raw oysters/shellfish - Ensure every night before going to sleep  Follow up 6 weeks  It was a pleasure to see you today. I want to create trusting relationships with patients and provide genuine, compassionate, and quality care. I truly value your feedback! please be on the lookout for a survey regarding your visit with me today. I appreciate your input about our visit and your time in completing this!    Neal Oshea L. Zaide Kardell, MSN, APRN, AGNP-C Adult-Gerontology Nurse Practitioner [facility] at [facility]

## 2024-12-16 NOTE — Progress Notes (Unsigned)
 "  Referring Provider: Tobie Suzzane POUR, MD Primary Care Physician:  Tobie Suzzane POUR, MD Primary GI Physician: Dr. Shaaron   Chief Complaint  Patient presents with   Hospitalization Follow-up    Patient here today for a follow up from recent Ed to Hospital admission due to vomiting blood/coffee grounds like material. He also had seen some blood in stools. He had a Egd while in the hospital done by Dr. Shaaron. No current issues with this,but wanted to discuss having his hiatal hernia looked at.     HPI:   Elijah Shaffer is a 44 y.o. male with past medical history of GERD complicated by long segment nondysplastic Barrett's esophagus, history of diverticulitis and anxiety,  OSA, large hiatal hernia, polyneuropathy, IBS-C, MASH  Patient presenting today for:  Hospital follow up of cameron lesions Esophageal varices  Presented to the ED on 1/28 with melena and hematemesis.  remained hemodynamically stable since presentation. Initial hemoglobin in the 17 range. Potassium elevated to 6.5; repeat 4.9   Underwent EGD on 1/28: Long segment Barrett's epithelium without                            nodularity -not manipulated                           -Grade 1-2 esophageal varices as described without                            bleeding stigmata                           -Large hiatal hernia with circumferential Ole                            excoriations straddling the diaphragmatic hiatus.                           -Recent mucosal bleeding in the hernia sac without                            focal lesion identified.                           -Hemospray deployed                           -Changes consistent with portal hypertensive                            gastropathy. Normal duodenal bulb and second                            portion of the duodenum.                           - No specimens collected.  Recommended referral for possible [facility] repair though may be challenging given PHG, return  to outpatient setting for cirrhosis care, consider NSBB   Hemoglobin 14.1 on 1/29, plt count 217k  Present: Reports some pain in his mid stomach at  times that comes and goes. He denies any nausea or vomiting. Appetite is good. No further melena or rectal bleeding. Feels appetite is good. Notes some swelling to his abdomen at times that has been going on for a while. He saw PCP and was switched to Protonix  40mg  BID as omeprazole  is not covered by his insurance.      Last Colonoscopy:05/2021 -  The perianal and digital rectal examinations were normal. Multiple large-mouthed diverticula were found in the sigmoid colon, transverse colon, ascending colon and cecum. Non-bleeding internal hemorrhoids were found during retroflexion. The  hemorrhoids were small.   Recommended to repeat in 5 years due to family history of cancer.  Filed Weights   12/16/24 1346  Weight: 272 lb 6.4 oz (123.6 kg)     Past Medical History:  Diagnosis Date   Abdominal hernia    2025   Acute renal injury 06/28/2017   Anxiety    Aspiration pneumonia of right upper lobe due to gastric secretions (HCC)    Diabetes mellitus without complication (HCC)    Diverticulitis    GERD (gastroesophageal reflux disease) 01/08/2018   Hiatal hernia    2022   Hypertension    Prediabetes    Seasonal allergies    Sleep apnea    Syncope 06/26/2021    Past Surgical History:  Procedure Laterality Date   BIOPSY  03/18/2021   Procedure: BIOPSY;  Surgeon: Eartha Angelia Sieving, MD;  [facility];  Service: Gastroenterology;;  esophageal at Z-line   BIOPSY  05/23/2021   Procedure: BIOPSY;  Surgeon: Eartha Angelia Sieving, MD;  [facility];  Service: Gastroenterology;;   BIOPSY  08/19/2023   Procedure: BIOPSY;  Surgeon: Eartha Angelia Sieving, MD;  [facility];  Service: Gastroenterology;;   CARDIOPULMONARY EXERCISE TEST (CPX)  01/09/2022   Interpretation limited by submaximal effort.   Severe functional impairment when compared to max sedentary norms.  No cardiopulmonary limitations.  Noted tremor and generalized weakness that lead to premature exercise termination-consider neuromuscular evaluation.   COLONOSCOPY N/A 09/22/2017   Procedure: COLONOSCOPY;  Surgeon: Golda Claudis PENNER, MD;  [facility];  Service: Endoscopy;  Laterality: N/A;  9:55   COLONOSCOPY WITH PROPOFOL  N/A 05/23/2021   Procedure: COLONOSCOPY WITH PROPOFOL ;  Surgeon: Eartha Angelia Sieving, MD;  [facility];  Service: Gastroenterology;  Laterality: N/A;  7:30   DRUG INDUCED ENDOSCOPY N/A 10/25/2024   Procedure: DRUG INDUCED SLEEP ENDOSCOPY;  Surgeon: Tobie Eldora NOVAK, MD;  Location: [facility];  Service: ENT;  Laterality: N/A;   ESOPHAGOGASTRODUODENOSCOPY N/A 12/08/2024   Procedure: EGD (ESOPHAGOGASTRODUODENOSCOPY);  Surgeon: Shaaron Lamar HERO, MD;  [facility];  Service: Endoscopy;  Laterality: N/A;   ESOPHAGOGASTRODUODENOSCOPY (EGD) WITH PROPOFOL  N/A 03/18/2021   Procedure: ESOPHAGOGASTRODUODENOSCOPY (EGD) WITH PROPOFOL ;  Surgeon: Eartha Angelia Sieving, MD;  [facility];  Service: Gastroenterology;  Laterality: N/A;   ESOPHAGOGASTRODUODENOSCOPY (EGD) WITH PROPOFOL  N/A 05/23/2021   Procedure: ESOPHAGOGASTRODUODENOSCOPY (EGD) WITH PROPOFOL ;  Surgeon: Eartha Angelia Sieving, MD;  [facility];  Service: Gastroenterology;  Laterality: N/A;   ESOPHAGOGASTRODUODENOSCOPY (EGD) WITH PROPOFOL  N/A 08/19/2023   Procedure: ESOPHAGOGASTRODUODENOSCOPY (EGD) WITH PROPOFOL ;  Surgeon: Eartha Angelia Sieving, MD;  [facility];  Service: Gastroenterology;  Laterality: N/A;  11:45AM;ASA 3   ESOPHAGOGASTRODUODENOSCOPY (EGD) WITH PROPOFOL  N/A 10/08/2023   Procedure: ESOPHAGOGASTRODUODENOSCOPY (EGD) WITH PROPOFOL ;  Surgeon: Eartha Angelia Sieving, MD;  [facility];  Service: Gastroenterology;  Laterality: N/A;  1:45PM;ASA 1-2    GIVENS CAPSULE STUDY N/A 10/08/2023   Procedure: GIVENS CAPSULE STUDY;  Surgeon: Eartha Angelia Sieving, MD;  [facility];  Service: Gastroenterology;  Laterality: N/A;  1:45PM;ASA 1-2   POLYPECTOMY  09/22/2017   Procedure: POLYPECTOMY;  Surgeon: Golda Claudis PENNER, MD;  [facility];  Service: Endoscopy;;   RIGHT/LEFT HEART CATH AND CORONARY ANGIOGRAPHY N/A 12/12/2021   Procedure: RIGHT/LEFT HEART CATH AND CORONARY ANGIOGRAPHY;  Surgeon: Cherrie Sieving SAUNDERS, MD;  Location: [facility];  Service: Cardiovascular:: Normal Coronaries & Hemodynamics.  EF 50-55%. RA = 4 mmHg, RV = 23/4; PA = 22/6 (14) & PCW = 7; Ao sat = 95%; PA sat = 76%, 76%& SVC sat = 74%Fick cardiac output/index = 7.1/3.0; PVR = 1.0 WU   TRANSTHORACIC ECHOCARDIOGRAM  02/24/2021   EF 60 to 65%.  Normal LV function.  Normal valves.  Mild RV dilation with normal function.   ZIO PATCH EVENT MONITOR  01/2022   Sinus rhythm with heart rate range 24 to 159 bpm, 1  AVB and Wenckebach block noted along with rare PACs and PVCs.  No arrhythmias.    Current Outpatient Medications  Medication Sig Dispense Refill   albuterol  (VENTOLIN  HFA) 108 (90 Base) MCG/ACT inhaler Inhale 1-2 puffs into the lungs every 6 (six) hours as needed for wheezing or shortness of breath.     Blood Glucose Monitoring Suppl DEVI 1 each by Does not apply route in the morning, at noon, and at bedtime. May substitute to any manufacturer covered by patient's insurance. 1 each 0   cetirizine (ZYRTEC) 10 MG tablet Take 10 mg by mouth daily.     clindamycin  (CLEOCIN  T) 1 % lotion Apply topically as needed. APPLY TO BUMPS ONCE DAILY AFTER SHOWER.     clobetasol  cream (TEMOVATE ) 0.05 % Apply topically as needed. Spot treat more severe areas once to twice daily until improved. Avoid applying to face, groin, and axilla. Use as directed. Long-term use can cause thinning of the skin.     Continuous Glucose Sensor (FREESTYLE LIBRE 3 PLUS SENSOR)  MISC Change sensor every 15 days. 6 each 3   diphenhydrAMINE -zinc  acetate (BENADRYL  EXTRA STRENGTH) cream Apply 1 application topically 3 (three) times daily as needed for itching. 28.4 g 0   docusate sodium  (COLACE) 100 MG capsule Take 100 mg by mouth 2 (two) times daily.     Dulaglutide  (TRULICITY ) 1.5 MG/0.5ML SOAJ Inject 1.5 mg into the skin once a week. 6 mL 3   DULoxetine  (CYMBALTA ) 30 MG capsule Take 30 mg by mouth 2 (two) times daily.     empagliflozin  (JARDIANCE ) 10 MG TABS tablet Take 1 tablet (10 mg total) by mouth daily before breakfast. 90 tablet 3   fenofibrate  micronized (LOFIBRA) 134 MG capsule Take 1 capsule (134 mg total) by mouth daily before breakfast. 90 capsule 3   fluticasone  (FLONASE ) 50 MCG/ACT nasal spray Place 1 spray into both nostrils daily. 100 mL 2   gabapentin  (NEURONTIN ) 300 MG capsule Take 300 mg by mouth 3 (three) times daily.     Glucose Blood (BLOOD GLUCOSE TEST STRIPS) STRP 1 each by In Vitro route in the morning, at noon, and at bedtime. May substitute to any manufacturer covered by patient's insurance. 100 strip 1   hydrOXYzine  (ATARAX ) 25 MG tablet TAKE 1 TO 2 TABLETS BY MOUTH AT BEDTIME AS NEEDED FOR ITCHING 60 tablet 1   ketoconazole  (NIZORAL ) 2 % shampoo Massage into scalp 2-3 times  a week, let sit several minutes before rinsing. 120 mL 11   lubiprostone  (AMITIZA ) 24 MCG capsule TAKE 1 CAPSULE BY MOUTH TWICE DAILY WITH A MEAL (Patient taking differently: Take 24 mcg by mouth 2 (two) times daily with a meal.) 180 capsule 0   mometasone  (ELOCON ) 0.1 % cream Apply topically qd/bid for itchy areas at legs as needed for atopic dermatitis 45 g 3   montelukast  (SINGULAIR ) 10 MG tablet TAKE 1 TABLET BY MOUTH AT BEDTIME 90 tablet 0   ondansetron  (ZOFRAN ) 4 MG tablet Take 1 tablet (4 mg total) by mouth every 6 (six) hours as needed for nausea. 20 tablet 0   pantoprazole  (PROTONIX ) 40 MG tablet Take 1 tablet (40 mg total) by mouth 2 (two) times daily. 60 tablet 5    rosuvastatin  (CRESTOR ) 40 MG tablet Take 1 tablet (40 mg total) by mouth daily. 90 tablet 3   Testosterone  25 MG/2.5GM (1%) GEL Place 5 g onto the skin every morning. 1 packet to each shoulder q am. 150 g 5   triamcinolone  cream (KENALOG ) 0.1 %      Vitamin D , Ergocalciferol , (DRISDOL ) 1.25 MG (50000 UNIT) CAPS capsule Take 1 capsule by mouth once a week 12 capsule 1   No current facility-administered medications for this visit.    Allergies as of 12/16/2024 - Review Complete 12/16/2024  Allergen Reaction Noted   Loratadine-pseudoephedrine er Palpitations 01/30/2022   Pollen extract Other (See Comments) 06/26/2021    Social History   Socioeconomic History   Marital status: Single    Spouse name: Not on file   Number of children: Not on file   Years of education: Not on file   Highest education level: Not on file  Occupational History   Not on file  Tobacco Use   Smoking status: Never    Passive exposure: Past   Smokeless tobacco: Never  Vaping Use   Vaping status: Never Used  Substance and Sexual Activity   Alcohol use: Not Currently    Comment: occasionally   Drug use: No   Sexual activity: Not on file  Other Topics Concern   Not on file  Social History Narrative   He currently has his own lawn care landscaping service. He previous had 80 yards before he got sick and since decreased. He works around a interior and spatial designer. One of the chemical names is roundup.    Social Drivers of Health   Tobacco Use: Low Risk (12/16/2024)   Patient History    Smoking Tobacco Use: Never    Smokeless Tobacco Use: Never    Passive Exposure: Past  Financial Resource Strain: Not on file  Food Insecurity: No Food Insecurity (12/08/2024)   Epic    Worried About Programme Researcher, Broadcasting/film/video in the Last Year: Never true    Ran Out of Food in the Last Year: Never true  Transportation Needs: No Transportation Needs (12/08/2024)   Epic    Lack of Transportation (Medical): No    Lack of Transportation  (Non-Medical): No  Physical Activity: Insufficiently Active (03/29/2022)   Exercise Vital Sign    Days of Exercise per Week: 2 days    Minutes of Exercise per Session: 10 min  Stress: Stress Concern Present (05/07/2022)   [employer] of Occupational Health - Occupational Stress Questionnaire    Feeling of Stress : To some extent  Social Connections: Not on file  Depression (PHQ2-9): Low Risk (12/14/2024)   Depression (PHQ2-9)    PHQ-2 Score: 0  Alcohol Screen: Not on file  Housing: Low Risk (12/08/2024)   Epic    Unable to Pay for Housing in the Last Year: No    Number of Times Moved in the Last Year: 0    Homeless in the Last Year: No  Utilities: Not At Risk (12/08/2024)   Epic    Threatened with loss of utilities: No  Health Literacy: Adequate Health Literacy (06/20/2023)   B1300 Health Literacy    Frequency of need for help with medical instructions: Never    Review of systems General: negative for malaise, night sweats, fever, chills, weight los Neck: Negative for lumps, goiter, pain and significant neck swelling Resp: Negative for cough, wheezing, dyspnea at rest CV: Negative for chest pain, leg swelling, palpitations, orthopnea GI: denies melena, hematochezia, nausea, vomiting, diarrhea, constipation, dysphagia, odyonophagia, early satiety or unintentional weight loss.  MSK: Negative for joint pain or swelling, back pain, and muscle pain. Derm: Negative for itching or rash Psych: Denies depression, anxiety, memory loss, confusion. No homicidal or suicidal ideation.  Heme: Negative for prolonged bleeding, bruising easily, and swollen nodes. Endocrine: Negative for cold or heat intolerance, polyuria, polydipsia and goiter. Neuro: negative for tremor, gait imbalance, syncope and seizures. The remainder of the review of systems is noncontributory.  Physical Exam: BP 126/79 (BP Location: Left Arm, Patient Position: Sitting, Cuff Size: Large)   Pulse 91   Temp (!) 97.1 F  (36.2 C) (Temporal)   Ht 6' 3 (1.905 m)   Wt 272 lb 6.4 oz (123.6 kg)   BMI 34.05 kg/m  General:   Alert and oriented. No distress noted. Pleasant and cooperative.  Head:  Normocephalic and atraumatic. Eyes:  Conjuctiva clear without scleral icterus. Mouth:  Oral mucosa pink and moist. Good dentition. No lesions. Heart: Normal rate and rhythm, s1 and s2 heart sounds present.  Lungs: Clear lung sounds in all lobes. Respirations equal and unlabored. Abdomen:  +BS, soft, non-tender and non-distended. No rebound or guarding. No HSM or masses noted. Derm: No palmar erythema or jaundice Msk:  Symmetrical without gross deformities. Normal posture. Extremities:  Without edema. Neurologic:  Alert and  oriented x4 Psych:  Alert and cooperative. Normal mood and affect.  Invalid input(s): 6 MONTHS   ASSESSMENT: JERIMIE MANCUSO is a 44 y.o. male presenting today for hospital follow up for cameron lesions secondary to large hiatal hernia and concerns for cirrhosis     Concerns for cirrhosis: history of MASH. NAFLD score indicates F0-F2, though recent EGD with EVs and Portal hypertensive gastropathy. Last imaging of the liver in late 2024 via US  elastograpy with low kpa and hepatic steatosis, no evidence of cirrhosis at that time. Platelet count interestingly enough remains above 150k. For now, will start NSBB with coreg  3.125mg  BID for bleeding prophylaxis of EVs and obtain MELD labs, AFP as well as RUQ US  for HCC screening. Inclined to possibly evaluate further via fibroscan given some discrepancy in findings regarding true presence of cirrhosis. He has had episodes of abdominal distention previously, which may likely be related to fluid overload though no evidence of ascites one exam today. He will make me aware if he experiences further abdominal distention.    PLAN:  -coreg  3.125mg  BID -RUQ US  -AFP, INR, CMP, CBC -consider fibroscan after US  -referral to [facility] for evaluation of  HH -consider addition of lasix/spironolactone pending CMP results  All questions were answered, patient verbalized understanding and is in agreement with plan as outlined above.   Follow Up:  6 weeks   Elijah Cobbins L. Costella Schwarz, MSN, APRN, AGNP-C Adult-Gerontology Nurse Practitioner [facility]  "

## 2024-12-20 ENCOUNTER — Ambulatory Visit: Admitting: Orthopedic Surgery

## 2024-12-21 ENCOUNTER — Ambulatory Visit (INDEPENDENT_AMBULATORY_CARE_PROVIDER_SITE_OTHER): Admitting: Otolaryngology

## 2024-12-21 ENCOUNTER — Ambulatory Visit: Admitting: Dermatology

## 2024-12-21 ENCOUNTER — Ambulatory Visit (HOSPITAL_COMMUNITY)

## 2024-12-27 ENCOUNTER — Ambulatory Visit (HOSPITAL_BASED_OUTPATIENT_CLINIC_OR_DEPARTMENT_OTHER): Admit: 2024-12-27 | Admitting: Otolaryngology

## 2024-12-27 ENCOUNTER — Encounter (HOSPITAL_BASED_OUTPATIENT_CLINIC_OR_DEPARTMENT_OTHER): Payer: Self-pay

## 2024-12-27 SURGERY — INSERTION, HYPOGLOSSAL NERVE STIMULATOR
Anesthesia: General

## 2024-12-29 ENCOUNTER — Encounter: Admitting: Nutrition

## 2025-01-03 ENCOUNTER — Ambulatory Visit (INDEPENDENT_AMBULATORY_CARE_PROVIDER_SITE_OTHER): Admitting: Otolaryngology

## 2025-01-19 ENCOUNTER — Ambulatory Visit: Payer: Self-pay | Admitting: Internal Medicine

## 2025-01-25 ENCOUNTER — Ambulatory Visit (INDEPENDENT_AMBULATORY_CARE_PROVIDER_SITE_OTHER): Admitting: Gastroenterology

## 2025-03-25 ENCOUNTER — Ambulatory Visit: Payer: Self-pay | Admitting: Neurology

## 2025-04-14 ENCOUNTER — Inpatient Hospital Stay

## 2025-04-21 ENCOUNTER — Inpatient Hospital Stay: Admitting: Oncology

## 2025-04-29 ENCOUNTER — Inpatient Hospital Stay: Payer: Self-pay | Admitting: Oncology

## 2025-08-01 ENCOUNTER — Ambulatory Visit: Admitting: Urology
